# Patient Record
Sex: Female | Born: 1935
Health system: Southern US, Community
[De-identification: ages and names within clinical notes are randomized; demographics above are authoritative.]

## PROBLEM LIST (undated history)

## (undated) DIAGNOSIS — K649 Unspecified hemorrhoids: Secondary | ICD-10-CM

## (undated) DIAGNOSIS — K219 Gastro-esophageal reflux disease without esophagitis: Secondary | ICD-10-CM

## (undated) DIAGNOSIS — F32A Depression, unspecified: Secondary | ICD-10-CM

## (undated) DIAGNOSIS — I10 Essential (primary) hypertension: Secondary | ICD-10-CM

## (undated) DIAGNOSIS — Q828 Other specified congenital malformations of skin: Secondary | ICD-10-CM

## (undated) DIAGNOSIS — N189 Chronic kidney disease, unspecified: Secondary | ICD-10-CM

## (undated) DIAGNOSIS — F419 Anxiety disorder, unspecified: Secondary | ICD-10-CM

## (undated) DIAGNOSIS — D649 Anemia, unspecified: Secondary | ICD-10-CM

## (undated) DIAGNOSIS — Z8619 Personal history of other infectious and parasitic diseases: Secondary | ICD-10-CM

## (undated) DIAGNOSIS — B351 Tinea unguium: Secondary | ICD-10-CM

## (undated) DIAGNOSIS — E119 Type 2 diabetes mellitus without complications: Secondary | ICD-10-CM

## (undated) DIAGNOSIS — K5900 Constipation, unspecified: Secondary | ICD-10-CM

## (undated) DIAGNOSIS — E039 Hypothyroidism, unspecified: Secondary | ICD-10-CM

## (undated) DIAGNOSIS — G2581 Restless legs syndrome: Secondary | ICD-10-CM

## (undated) DIAGNOSIS — E785 Hyperlipidemia, unspecified: Secondary | ICD-10-CM

## (undated) HISTORY — PX: TONSILECTOMY, ADENOIDECTOMY, BILATERAL MYRINGOTOMY AND TUBES: SHX2538

## (undated) HISTORY — PX: KIDNEY STONE SURGERY: SHX686

## (undated) HISTORY — PX: COLONOSCOPY W/ POLYPECTOMY: SHX1380

## (undated) HISTORY — PX: BREAST BIOPSY: SHX20

## (undated) HISTORY — PX: TONSILLECTOMY: SUR1361

## (undated) HISTORY — DX: Tinea unguium: B35.1

## (undated) HISTORY — DX: Essential (primary) hypertension: I10

## (undated) HISTORY — DX: Gastro-esophageal reflux disease without esophagitis: K21.9

## (undated) HISTORY — PX: TRIGGER FINGER RELEASE: SHX641

## (undated) HISTORY — PX: OTHER SURGICAL HISTORY: SHX169

## (undated) HISTORY — PX: EYE SURGERY: SHX253

## (undated) HISTORY — PX: BREAST EXCISIONAL BIOPSY: SUR124

## (undated) HISTORY — DX: Chronic kidney disease, unspecified: N18.9

## (undated) HISTORY — PX: HAND SURGERY: SHX662

## (undated) HISTORY — DX: Other specified congenital malformations of skin: Q82.8

---

## 2003-12-03 ENCOUNTER — Ambulatory Visit: Payer: Self-pay | Admitting: Internal Medicine

## 2003-12-20 ENCOUNTER — Ambulatory Visit: Payer: Self-pay | Admitting: Internal Medicine

## 2004-04-08 ENCOUNTER — Ambulatory Visit: Payer: Self-pay | Admitting: Unknown Physician Specialty

## 2004-07-29 ENCOUNTER — Ambulatory Visit: Payer: Self-pay | Admitting: Internal Medicine

## 2004-08-18 ENCOUNTER — Ambulatory Visit: Payer: Self-pay | Admitting: Unknown Physician Specialty

## 2005-08-04 ENCOUNTER — Ambulatory Visit: Payer: Self-pay | Admitting: Internal Medicine

## 2005-10-21 ENCOUNTER — Ambulatory Visit: Payer: Self-pay | Admitting: Internal Medicine

## 2005-11-19 ENCOUNTER — Ambulatory Visit: Payer: Self-pay | Admitting: Internal Medicine

## 2006-04-12 ENCOUNTER — Ambulatory Visit: Payer: Self-pay | Admitting: Ophthalmology

## 2006-04-14 ENCOUNTER — Ambulatory Visit: Payer: Self-pay | Admitting: Ophthalmology

## 2006-08-18 ENCOUNTER — Ambulatory Visit: Payer: Self-pay | Admitting: Unknown Physician Specialty

## 2006-08-26 ENCOUNTER — Ambulatory Visit: Payer: Self-pay | Admitting: Internal Medicine

## 2006-11-12 ENCOUNTER — Ambulatory Visit: Payer: Self-pay | Admitting: Urology

## 2007-08-30 ENCOUNTER — Ambulatory Visit: Payer: Self-pay | Admitting: Internal Medicine

## 2008-11-22 ENCOUNTER — Ambulatory Visit: Payer: Self-pay | Admitting: Internal Medicine

## 2009-11-21 ENCOUNTER — Ambulatory Visit: Payer: Self-pay | Admitting: Unknown Physician Specialty

## 2009-11-22 LAB — PATHOLOGY REPORT

## 2009-11-27 ENCOUNTER — Ambulatory Visit: Payer: Self-pay | Admitting: Internal Medicine

## 2011-01-05 ENCOUNTER — Ambulatory Visit: Payer: Self-pay | Admitting: Internal Medicine

## 2011-02-13 ENCOUNTER — Ambulatory Visit: Payer: Self-pay | Admitting: Unknown Physician Specialty

## 2012-01-06 ENCOUNTER — Ambulatory Visit: Payer: Self-pay | Admitting: Internal Medicine

## 2012-06-28 ENCOUNTER — Emergency Department: Payer: Self-pay | Admitting: Emergency Medicine

## 2012-06-28 LAB — CBC
HGB: 9.7 g/dL — ABNORMAL LOW (ref 12.0–16.0)
MCH: 31 pg (ref 26.0–34.0)
WBC: 8.2 10*3/uL (ref 3.6–11.0)

## 2012-06-29 LAB — BASIC METABOLIC PANEL
Anion Gap: 6 — ABNORMAL LOW (ref 7–16)
BUN: 27 mg/dL — ABNORMAL HIGH (ref 7–18)
Calcium, Total: 8.9 mg/dL (ref 8.5–10.1)
Chloride: 109 mmol/L — ABNORMAL HIGH (ref 98–107)
EGFR (African American): 34 — ABNORMAL LOW
Glucose: 89 mg/dL (ref 65–99)
Osmolality: 284 (ref 275–301)
Potassium: 4.2 mmol/L (ref 3.5–5.1)

## 2012-06-29 LAB — TROPONIN I: Troponin-I: <0.02 ng/mL

## 2013-01-10 ENCOUNTER — Ambulatory Visit: Payer: Self-pay | Admitting: Family Medicine

## 2013-05-25 DIAGNOSIS — B351 Tinea unguium: Secondary | ICD-10-CM | POA: Insufficient documentation

## 2013-05-25 DIAGNOSIS — L851 Acquired keratosis [keratoderma] palmaris et plantaris: Secondary | ICD-10-CM | POA: Insufficient documentation

## 2013-06-15 DIAGNOSIS — M189 Osteoarthritis of first carpometacarpal joint, unspecified: Secondary | ICD-10-CM | POA: Insufficient documentation

## 2013-09-05 DIAGNOSIS — N183 Chronic kidney disease, stage 3 unspecified: Secondary | ICD-10-CM | POA: Insufficient documentation

## 2013-09-05 DIAGNOSIS — N189 Chronic kidney disease, unspecified: Secondary | ICD-10-CM | POA: Insufficient documentation

## 2013-09-05 DIAGNOSIS — N1831 Chronic kidney disease, stage 3a: Secondary | ICD-10-CM | POA: Insufficient documentation

## 2013-09-05 DIAGNOSIS — I129 Hypertensive chronic kidney disease with stage 1 through stage 4 chronic kidney disease, or unspecified chronic kidney disease: Secondary | ICD-10-CM | POA: Insufficient documentation

## 2014-01-16 ENCOUNTER — Ambulatory Visit: Payer: Self-pay | Admitting: Family Medicine

## 2015-03-12 DIAGNOSIS — Z79899 Other long term (current) drug therapy: Secondary | ICD-10-CM | POA: Diagnosis not present

## 2015-03-12 DIAGNOSIS — N183 Chronic kidney disease, stage 3 (moderate): Secondary | ICD-10-CM | POA: Diagnosis not present

## 2015-03-12 DIAGNOSIS — E039 Hypothyroidism, unspecified: Secondary | ICD-10-CM | POA: Diagnosis not present

## 2015-03-12 DIAGNOSIS — E1122 Type 2 diabetes mellitus with diabetic chronic kidney disease: Secondary | ICD-10-CM | POA: Diagnosis not present

## 2015-03-19 DIAGNOSIS — Z79899 Other long term (current) drug therapy: Secondary | ICD-10-CM | POA: Diagnosis not present

## 2015-03-19 DIAGNOSIS — N183 Chronic kidney disease, stage 3 (moderate): Secondary | ICD-10-CM | POA: Diagnosis not present

## 2015-03-19 DIAGNOSIS — E039 Hypothyroidism, unspecified: Secondary | ICD-10-CM | POA: Diagnosis not present

## 2015-03-19 DIAGNOSIS — I10 Essential (primary) hypertension: Secondary | ICD-10-CM | POA: Diagnosis not present

## 2015-03-19 DIAGNOSIS — E1165 Type 2 diabetes mellitus with hyperglycemia: Secondary | ICD-10-CM | POA: Diagnosis not present

## 2015-03-19 DIAGNOSIS — E7800 Pure hypercholesterolemia, unspecified: Secondary | ICD-10-CM | POA: Diagnosis not present

## 2015-03-19 DIAGNOSIS — E1122 Type 2 diabetes mellitus with diabetic chronic kidney disease: Secondary | ICD-10-CM | POA: Diagnosis not present

## 2015-05-10 DIAGNOSIS — D225 Melanocytic nevi of trunk: Secondary | ICD-10-CM | POA: Diagnosis not present

## 2015-05-10 DIAGNOSIS — D2271 Melanocytic nevi of right lower limb, including hip: Secondary | ICD-10-CM | POA: Diagnosis not present

## 2015-05-10 DIAGNOSIS — L821 Other seborrheic keratosis: Secondary | ICD-10-CM | POA: Diagnosis not present

## 2015-05-10 DIAGNOSIS — D2262 Melanocytic nevi of left upper limb, including shoulder: Secondary | ICD-10-CM | POA: Diagnosis not present

## 2015-06-21 DIAGNOSIS — E119 Type 2 diabetes mellitus without complications: Secondary | ICD-10-CM | POA: Diagnosis not present

## 2015-08-01 DIAGNOSIS — M1811 Unilateral primary osteoarthritis of first carpometacarpal joint, right hand: Secondary | ICD-10-CM | POA: Diagnosis not present

## 2015-09-09 DIAGNOSIS — E1122 Type 2 diabetes mellitus with diabetic chronic kidney disease: Secondary | ICD-10-CM | POA: Diagnosis not present

## 2015-09-09 DIAGNOSIS — E039 Hypothyroidism, unspecified: Secondary | ICD-10-CM | POA: Diagnosis not present

## 2015-09-09 DIAGNOSIS — Z79899 Other long term (current) drug therapy: Secondary | ICD-10-CM | POA: Diagnosis not present

## 2015-09-09 DIAGNOSIS — E7800 Pure hypercholesterolemia, unspecified: Secondary | ICD-10-CM | POA: Diagnosis not present

## 2015-09-09 DIAGNOSIS — N183 Chronic kidney disease, stage 3 (moderate): Secondary | ICD-10-CM | POA: Diagnosis not present

## 2015-09-09 DIAGNOSIS — E1165 Type 2 diabetes mellitus with hyperglycemia: Secondary | ICD-10-CM | POA: Diagnosis not present

## 2015-09-16 DIAGNOSIS — E039 Hypothyroidism, unspecified: Secondary | ICD-10-CM | POA: Diagnosis not present

## 2015-09-16 DIAGNOSIS — Z79899 Other long term (current) drug therapy: Secondary | ICD-10-CM | POA: Diagnosis not present

## 2015-09-16 DIAGNOSIS — Z0000 Encounter for general adult medical examination without abnormal findings: Secondary | ICD-10-CM | POA: Diagnosis not present

## 2015-09-16 DIAGNOSIS — E1122 Type 2 diabetes mellitus with diabetic chronic kidney disease: Secondary | ICD-10-CM | POA: Diagnosis not present

## 2015-09-16 DIAGNOSIS — N183 Chronic kidney disease, stage 3 (moderate): Secondary | ICD-10-CM | POA: Diagnosis not present

## 2015-09-16 DIAGNOSIS — D638 Anemia in other chronic diseases classified elsewhere: Secondary | ICD-10-CM | POA: Diagnosis not present

## 2015-09-16 DIAGNOSIS — E7800 Pure hypercholesterolemia, unspecified: Secondary | ICD-10-CM | POA: Diagnosis not present

## 2015-09-16 DIAGNOSIS — E1165 Type 2 diabetes mellitus with hyperglycemia: Secondary | ICD-10-CM | POA: Diagnosis not present

## 2016-02-25 ENCOUNTER — Encounter: Payer: Self-pay | Admitting: Family Medicine

## 2016-02-25 ENCOUNTER — Ambulatory Visit (INDEPENDENT_AMBULATORY_CARE_PROVIDER_SITE_OTHER): Payer: Self-pay | Admitting: Family Medicine

## 2016-02-25 ENCOUNTER — Telehealth: Payer: Self-pay | Admitting: Family Medicine

## 2016-02-25 VITALS — BP 130/67 | HR 69 | Temp 99.0°F | Resp 16 | Ht 59.0 in | Wt 134.0 lb

## 2016-02-25 DIAGNOSIS — N183 Chronic kidney disease, stage 3 (moderate): Secondary | ICD-10-CM

## 2016-02-25 DIAGNOSIS — K5909 Other constipation: Secondary | ICD-10-CM

## 2016-02-25 DIAGNOSIS — E119 Type 2 diabetes mellitus without complications: Secondary | ICD-10-CM | POA: Diagnosis not present

## 2016-02-25 DIAGNOSIS — M150 Primary generalized (osteo)arthritis: Secondary | ICD-10-CM

## 2016-02-25 DIAGNOSIS — E1122 Type 2 diabetes mellitus with diabetic chronic kidney disease: Secondary | ICD-10-CM

## 2016-02-25 DIAGNOSIS — I10 Essential (primary) hypertension: Secondary | ICD-10-CM

## 2016-02-25 DIAGNOSIS — E782 Mixed hyperlipidemia: Secondary | ICD-10-CM | POA: Diagnosis not present

## 2016-02-25 DIAGNOSIS — M47812 Spondylosis without myelopathy or radiculopathy, cervical region: Secondary | ICD-10-CM | POA: Diagnosis not present

## 2016-02-25 DIAGNOSIS — M8949 Other hypertrophic osteoarthropathy, multiple sites: Secondary | ICD-10-CM

## 2016-02-25 DIAGNOSIS — E038 Other specified hypothyroidism: Secondary | ICD-10-CM | POA: Diagnosis not present

## 2016-02-25 DIAGNOSIS — E063 Autoimmune thyroiditis: Secondary | ICD-10-CM

## 2016-02-25 DIAGNOSIS — E1121 Type 2 diabetes mellitus with diabetic nephropathy: Secondary | ICD-10-CM | POA: Insufficient documentation

## 2016-02-25 DIAGNOSIS — Z7689 Persons encountering health services in other specified circumstances: Secondary | ICD-10-CM | POA: Diagnosis not present

## 2016-02-25 DIAGNOSIS — K219 Gastro-esophageal reflux disease without esophagitis: Secondary | ICD-10-CM | POA: Diagnosis not present

## 2016-02-25 DIAGNOSIS — K449 Diaphragmatic hernia without obstruction or gangrene: Secondary | ICD-10-CM | POA: Diagnosis not present

## 2016-02-25 DIAGNOSIS — K5900 Constipation, unspecified: Secondary | ICD-10-CM | POA: Insufficient documentation

## 2016-02-25 DIAGNOSIS — E039 Hypothyroidism, unspecified: Secondary | ICD-10-CM | POA: Insufficient documentation

## 2016-02-25 DIAGNOSIS — E1169 Type 2 diabetes mellitus with other specified complication: Secondary | ICD-10-CM | POA: Insufficient documentation

## 2016-02-25 DIAGNOSIS — E785 Hyperlipidemia, unspecified: Secondary | ICD-10-CM

## 2016-02-25 DIAGNOSIS — M159 Polyosteoarthritis, unspecified: Secondary | ICD-10-CM | POA: Insufficient documentation

## 2016-02-25 LAB — POCT GLYCOSYLATED HEMOGLOBIN (HGB A1C): HEMOGLOBIN A1C: 7.4

## 2016-02-25 MED ORDER — SIMVASTATIN 40 MG PO TABS: 40.0000 mg | ORAL_TABLET | Freq: Every day | ORAL | 3 refills | Status: DC

## 2016-02-25 MED ORDER — LEVOTHYROXINE SODIUM 25 MCG PO TABS: ORAL_TABLET | ORAL | 3 refills | Status: DC

## 2016-02-25 MED ORDER — GLIPIZIDE 5 MG PO TABS: 5.0000 mg | ORAL_TABLET | Freq: Two times a day (BID) | ORAL | 3 refills | Status: DC

## 2016-02-25 MED ORDER — BACLOFEN 10 MG PO TABS: 5.0000 mg | ORAL_TABLET | Freq: Every evening | ORAL | 2 refills | Status: DC | PRN

## 2016-02-25 MED ORDER — OMEPRAZOLE 20 MG PO CPDR: 20.0000 mg | DELAYED_RELEASE_CAPSULE | Freq: Every day | ORAL | 3 refills | Status: DC

## 2016-02-25 MED ORDER — ONETOUCH BASIC SYSTEM W/DEVICE KIT: PACK | 0 refills | Status: DC

## 2016-02-25 MED ORDER — GLUCOSE BLOOD VI STRP
ORAL_STRIP | 3 refills | Status: DC
Start: 2016-02-25 — End: 2016-04-14

## 2016-02-25 MED ORDER — LISINOPRIL 20 MG PO TABS: 20.0000 mg | ORAL_TABLET | Freq: Every day | ORAL | 3 refills | Status: DC

## 2016-02-25 NOTE — Telephone Encounter (Signed)
Pt said her insurance would cover a One Touch meter.  Please call meter and 90 day strips to [employer].

## 2016-02-25 NOTE — Assessment & Plan Note (Signed)
Stable BP Complication in setting of DM2 with CKD-III-IV Continue current regimen Refill Lisinopril 20mg  daily, #90 day supply

## 2016-02-25 NOTE — Assessment & Plan Note (Signed)
Underlying etiology to chronic intermittent flares with pain, see A&P for C-spine, also with low back and hand upper extremity arthritis.

## 2016-02-25 NOTE — Patient Instructions (Signed)
Thank you for coming in to clinic today.  1. A1c 7.4, good result, no med change today, except did switch from Glipizide EXtended Relase to the 12 hour normal glipizide  - Call insurance and ask what preferred Glucometer is and notify us and we can send it to your pharmacy  Check sugar once a day as needed  2. For arthritis in neck and muscle pains - Recommend to start taking Tylenol Extra Strength 500mg  tabs - take 1 to 2 tabs per dose (max 1000mg ) every 6-8 hours for pain, max 24 hour daily dose is 6 tablets or 3000mg . In the future you can repeat the same everyday Tylenol course for 1-2 weeks at a time. - Stay away from Aspirin, Advil, Aleve  Start taking Baclofen (Lioresal) 10mg  (muscle relaxant) - start with half (cut) to one whole pill at night as needed for next 1-3 nights (may make you drowsy, caution with driving) see how it affects you, then if tolerated increase to one pill 2 to 3 times a day or (every 8 hours as needed)  Heating pad is good continue to use this as needed  Consider topical muscle rub if you want as well  Referral to Nephrology or kidney specialist - make sure they schedule you in [place] ([place]) [address] [place], [place] [address] Phone: [phone]   ---------------- Most likely you have some residual constipation - Start miralax again half cap powder daily for several days to see how you do on this, if regular soft well formed bowel movement 1-2 times a day then that is good - If not improved, then return to office and we can check X-ray - Also can try OTC Dulcolax stool softener pill 100mg  daily  Please schedule a follow-up appointment with Dr. Parks Ranger in 3 months to follow-up Mood / Anxiety PHQ / GAD  If you have any other questions or concerns, please feel free to call the clinic or send a message through [facility]. You may also schedule an earlier appointment if necessary.  Nobie Putnam, DO [place]

## 2016-02-25 NOTE — Assessment & Plan Note (Addendum)
Stable, controlled, A1c down to 7.4 (from 7.8) Complications - nephropathy with CKD III-IV, also in setting HTN  Plan:  1. Switched Glipizide ER 5mg  BID to regular Glipizide 5mg  BID for now, unsure reasoning behind ER BID dosing, however overall caution for hypoglycemia in elderly patient, may consider reducing dose sulfonylurea in future 2. Remain off Metformin due to CKD, however questionable if at current creatinine could resume metformin low dose, would defer to nephrology in future 3. Cannot take ASA due to CKD, continue statin, on ACEi 4. Encouraged lifestyle improvements with DM diet, however limited by husband diet (requires soft diet and she is primary caregiver meal prep), continue walking but may need to increase 5. Follow-up 3 months for DM visit, A1c, foot exam, check NCIR if due for next pneumonia vaccine, recommend ophthalmology (refer if needed)

## 2016-02-25 NOTE — Progress Notes (Signed)
Subjective:    Patient ID: Lisa Mills, female    DOB: 1929-08-21, 83 y.o.   MRN: [id number]  Lisa Mills is a 83 y.o. female presenting on 02/25/2016 for Establish Care (pt is concened about pain on Left side lower abdominal has trouble with indigestion )  Transfering care to [facility] due to lives locally in [place], and her husband is a patient here as well.  Specialists: - [facility] - Dr Baldemar Lenis - Dermatology - Dr Evorn Gong - Cardiology - [facility] (Dr Saralyn Pilar), seen once about 4 years ago for stress testing following episode of chest discomfort with all negative results, not available in Epic chart currently - Orthopedics - [facility] (Dr Rudene Christians, Dorise Hiss PA) - Gastroenterologist - [facility] (Dr Vira Agar)  HPI  Left Abdominal Discomfort / CONSTIPATION: - Reports symptoms started about 10 days ago with left sided flank under left lower rib pain with acute onset with intermittent episodes, mild severity 2/10, lasting several minutes, can repeat several times a day. Not improved or worsened by any activities, not worse with deep breathing, eating. Admits some associated bloated and gas symptoms. - Now no longer constipated over past few weeks, now recently improved, will currently have firmer stool balls x 1 BM not regular will often be erratic. History of using miralax about half cap daily in past for relief, she tried miralax a few weeks ago with relief then stopped it. She did have occasional problem of loose stool with some fecal incontinence rarely on miralax. - Additional related problem thought her constipation and other symptoms recently with dizziness were due to benadryl and prozac, she stopped these about 1 month ago and has had mostly improved symptoms. - History of hiatal hernia with some Left upper quadrant discomfort  Hyporthyroidism, Chronic - No other significant history, no recent changes, last TSH 4.060 (08/2015), has continued Levothyroxine 55mg daily  without change recently, requesting refill today  CHRONIC DM, Type 2: Reports no concerns today. Would like A1c. Last A1c 7.8 in 08/2015,  Previously had better control. - Request new glucometer, has health team adv ins, has older glucometer 81years old CBGs: Avg 100-120s, Low >100, High <150. Checks CBGs x 1 daily Meds: Glipizide ER '5mg'$  BID, history of previously on Metformin but was taken off of this within past few years due to CKD Reports  good compliance. Tolerating well w/o side-effects Currently on ACEi (taking Lisinopril for years without problem) Lifestyle: Diet (mostly home cooked, eats soft foods due to husband having dentures, does not always follow DM diet due) / Exercise (walking dog when weather is good, several times a day) Denies hypoglycemia  CKD Stage III-IV, in setting of Chronic DM2  / HTN - Previously followed Nephrology many years ago for CKD, previously [place] in [place] but would request to [place]rology [place]. - Last labs on chart are 08/2015 with Cr 1.6 and eGFR 31, has had stable baseline 1.5 to 1.6 over past 1-2 years - Does not take NSAIDs or ASA as advised years ago  Chronic Osteoarthritis / DJD multiple joints including primarily C-spine with muscle spasm: - Reports chronic problem without significant known traumatic injury, prior history of C spine MRI 2006 with C5-6 DJD and C6-7 spinal stenosis, also history of other joint arthritis with low back, upper extremities including Right thumb with CMC arthritis, followed by [facility]- Currently stable without worsening symptoms, has intermittent flares of pain mostly neck and with muscle spasms,  in past she was given Tramadol 1 year ago PRN pain due to arthritis, tried this intermittently for up to 6 months but could not tolerate due to nausea, then most recently 08/2015 her PCP gave her rx Hydrocodone PRN, however she very rarely takes this and has bottle today to show limited  pill use - Takes Tylenol Arthritis '650mg'$  x 2 per dose but often does not repeat dose - Tries heating pad - Cannot take NSAIDs due to CKD  H/o Depression / Anxiety - Reports significant stress in her life as primary caregiver for her husband who is ill and her dog who has seizures and requires medicine 3 times daily. She does not endorse long chronic history of depression/anxiety, had trial in past on Prozac and did well, she was restarted on this 08/2015 low dose '10mg'$  daily, tolerated it well for about 4-5 months, until recently self discontinued due to concern with possible side effect of constipation - Denies suicidal or homicidal ideation  Additional PMH HTN, HLD - request refills  Depression screen PHQ 2/9 02/25/2016  Decreased Interest 0  Down, Depressed, Hopeless 0  PHQ - 2 Score 0    Past Medical History:  Diagnosis Date  . Arthritis   . Chronic kidney disease   . Dermatophytosis of nail   . Diabetes type 2, controlled ([place])   . GERD (gastroesophageal reflux disease)   . Hyperlipidemia   . Hypertension   . Keratoderma   . Shingles    Past Surgical History:  Procedure Laterality Date  . cataracts    . HAND SURGERY    . KIDNEY STONE SURGERY    . TONSILECTOMY, ADENOIDECTOMY, BILATERAL MYRINGOTOMY AND TUBES    . TRIGGER FINGER RELEASE     Left ring finger  . tubercular peritonitis     Social History   Social History  . Marital status: Married    Spouse name: N/A  . Number of children: N/A  . Years of education: N/A   Occupational History  . Not on file.   Social History Main Topics  . Smoking status: Never Smoker  . Smokeless tobacco: Never Used  . Alcohol use Yes  . Drug use: No  . Sexual activity: Not on file   Other Topics Concern  . Not on file   Social History Narrative  . No narrative on file   History reviewed. No pertinent family history. No current outpatient prescriptions on file prior to visit.   No current facility-administered medications  on file prior to visit.     Review of Systems  Constitutional: Negative for activity change, appetite change, chills, diaphoresis, fatigue, fever and unexpected weight change.  HENT: Negative for congestion, hearing loss and sinus pressure.   Eyes: Negative for visual disturbance.  Respiratory: Negative for cough, chest tightness, shortness of breath and wheezing.   Cardiovascular: Negative for chest pain, palpitations and leg swelling.  Gastrointestinal: Positive for abdominal pain. Negative for abdominal distention, anal bleeding, blood in stool, constipation, diarrhea, nausea and vomiting.  Endocrine: Negative for cold intolerance and polyuria.  Genitourinary: Negative for decreased urine volume, difficulty urinating, dysuria, frequency and hematuria.  Musculoskeletal: Positive for arthralgias (neck and back occasionally). Negative for back pain, joint swelling, myalgias and neck pain.  Skin: Negative for rash.  Allergic/Immunologic: Negative for environmental allergies.  Neurological: Negative for dizziness, weakness, light-headedness, numbness and headaches.  Hematological: Negative for adenopathy.  Psychiatric/Behavioral: Negative for agitation, behavioral problems, decreased concentration, dysphoric mood, self-injury, sleep disturbance and suicidal ideas.  The patient is nervous/anxious (with recent stress providing care for husband).    Per HPI unless specifically indicated above     Objective:    BP 130/67   Pulse 69   Temp 99 F (37.2 C) (Oral)   Resp 16   Ht '4\' 11"'$  (1.499 m)   Wt 134 lb (60.8 kg)   BMI 27.06 kg/m   Wt Readings from Last 3 Encounters:  02/25/16 134 lb (60.8 kg)    Physical Exam  Constitutional: She is oriented to person, place, and time. She appears well-developed and well-nourished. No distress.  Well-appearing elderly 83 yr female, comfortable, cooperative  HENT:  Head: Normocephalic and atraumatic.  Mouth/Throat: Oropharynx is clear and moist.    Nares patent without congestion or edema. Bilateral TMs clear without erythema, effusion or bulging. Oropharynx clear without erythema, exudates, edema or asymmetry.  Eyes: Conjunctivae and EOM are normal. Pupils are equal, round, and reactive to light.  Neck: Normal range of motion. Neck supple. No thyromegaly present.  Cardiovascular: Normal rate, regular rhythm, normal heart sounds and intact distal pulses.   No murmur heard. Pulmonary/Chest: Effort normal and breath sounds normal. No respiratory distress. She has no wheezes. She has no rales.  Abdominal: Soft. Bowel sounds are normal. She exhibits no distension and no mass. There is no tenderness. There is no rebound and no guarding.  Negative McBurney's RLQ. Negative Murphy's RUQ. Left lower abdomen is non tender, but some discomfort not tender to mid left abdomen  Musculoskeletal: Normal range of motion. She exhibits no edema or tenderness.  Back normal without deformity or abnormal curvature.  Upper / Lower Extremities: - Normal muscle tone, strength bilateral upper extremities 5/5, lower extremities 5/5  Lymphadenopathy:    She has no cervical adenopathy.  Neurological: She is alert and oriented to person, place, and time.  Distal sensation to light touch intact  Skin: Skin is warm and dry. No rash noted. She is not diaphoretic.  Psychiatric: She has a normal mood and affect. Her behavior is normal.  Well groomed, good eye contact, normal speech and thoughts  Nursing note and vitals reviewed.  Results for orders placed or performed in visit on 02/25/16  POCT glycosylated hemoglobin (Hb A1C)  Result Value Ref Range   Hemoglobin A1C 7.4       Assessment & Plan:   Problem List Items Addressed This Visit    Osteoarthritis of multiple joints    Underlying etiology to chronic intermittent flares with pain, see A&P for C-spine, also with low back and hand upper extremity arthritis.       Relevant Medications   baclofen  (LIORESAL) 10 MG tablet   Hypothyroidism    Stable on current Levothyroxine 85mg daily Last TSH 4.060 (08/2015) Refill Levothyroxine 90 day supply Check future TSH in 08/2016 for annual physical, order not placed yet      Relevant Medications   levothyroxine (SYNTHROID, LEVOTHROID) 25 MCG tablet   Hypertension    Stable BP Complication in setting of DM2 with CKD-III-IV Continue current regimen Refill Lisinopril '20mg'$  daily, #90 day supply      Relevant Medications   lisinopril (PRINIVIL,ZESTRIL) 20 MG tablet   simvastatin (ZOCOR) 40 MG tablet   Hyperlipidemia    Last lipids abnormal 08/2015 with increased LDL despite statin therapy Continue Simvastatin '40mg'$  daily without change, refilled today Encouraged healthy lifestyle exercise Follow-up fasting lipids 08/2016 for annual physical      Relevant Medications   lisinopril (PRINIVIL,ZESTRIL) 20 MG tablet  simvastatin (ZOCOR) 40 MG tablet   Hiatal hernia   GERD (gastroesophageal reflux disease)    Controlled on PPI omeprazole '20mg'$ , refill sent      Relevant Medications   omeprazole (PRILOSEC) 20 MG capsule   DJD (degenerative joint disease) of cervical spine    Stable DJD C-spine and multiple joints with chronic recurrent flares, currently well without flare Followed by [facility], last imaging MRI 2006 with C5-6 and C6-7 DJD spinal stenosis, may have updated imaging on outside records - Inadequate conservative treatments at home with limited Tylenol use, contraindicated NSAIDs, intolerant of Tramadol  Plan: 1. Increase Tylenol dosing up to '1000mg'$  TID PRN as discussed 2. Start trial new muscle relaxant Baclofen 5-'10mg'$  at night PRN for pain / muscle spasms and can help sleep, caution sedation, discussion on this being safer alternative to hydrocodone and opiates not preferred option for chronic pain for osteoarthritis of elderly 86 yr patient, she has existing rx and can take only if needed, advised that I do not plan to  continue this currently, until further evaluation in future if all other meds contraindicated due to CKD this may be possibility 3. Continue follow-up with [facility] as needed 4. Future consider PT if needed      Relevant Medications   baclofen (LIORESAL) 10 MG tablet   Diabetes type 2, controlled (HCC)    Stable, controlled, A1c down to 7.4 (from 7.8) Complications - nephropathy with CKD III-IV, also in setting HTN  Plan:  1. Switched Glipizide ER '5mg'$  BID to regular Glipizide '5mg'$  BID for now, unsure reasoning behind ER BID dosing, however overall caution for hypoglycemia in elderly patient, may consider reducing dose sulfonylurea in future 2. Remain off Metformin due to CKD, however questionable if at current creatinine could resume metformin low dose, would defer to nephrology in future 3. Cannot take ASA due to CKD, continue statin, on ACEi 4. Encouraged lifestyle improvements with DM diet, however limited by husband diet (requires soft diet and she is primary caregiver meal prep), continue walking but may need to increase 5. Follow-up 3 months for DM visit, A1c, foot exam, check NCIR if due for next pneumonia vaccine, recommend ophthalmology (refer if needed)      Relevant Medications   lisinopril (PRINIVIL,ZESTRIL) 20 MG tablet   glipiZIDE (GLUCOTROL) 5 MG tablet   simvastatin (ZOCOR) 40 MG tablet   Other Relevant Orders   POCT glycosylated hemoglobin (Hb A1C) (Completed)   Constipation    Acute on chronic problem, improving. Likely etiology for Left sided abdominal discomfort. Chronic history of intermittent constipation, unclear exact triggers, more recently seems to be worsened by benadryl use for insomnia, anticholinergic side effects with constipation, also possibly worse on Prozac, but unsure if this was cause of her symptoms  Plan: 1. Resume Miralax half to quarter cap daily for now titrate up / down as indicated for soft 1-2 well forms BMs daily, may need to  continue for few days to weeks, and resume in future as maintenance 2. Offered KUB today to check stool burden, declined will consider in future 3. Improve hydration, fiber in diet 4. May try OTC stool softener dulcolax for maintenance as well if miralax unable to tolerate due to some fecal incontinence 5. Follow-up as needed if not improved      CKD stage 3 due to type 2 diabetes mellitus (HCC)    Stable, chronic problem over past few years, baseline Cr 1.6, last checked 08/2015, secondary to likely DM2 and HTN,  also age at 34. - Previously followed by Nephrology years ago in [place] by report, no records available  Plan: 1. Reviewed DM and HTN control 2. Counseling on avoiding NSAIDs, and continue prior recommendations of avoid ASA as well currently, but would like further input from Nephrology on if ASA 81 mg could be viable option for this patient 3. Referral to [place] office (patient request) to establish care given significant CKD and may benefit from future nephrology management if CKD continues to decline      Relevant Medications   lisinopril (PRINIVIL,ZESTRIL) 20 MG tablet   glipiZIDE (GLUCOTROL) 5 MG tablet   simvastatin (ZOCOR) 40 MG tablet   Other Relevant Orders   Ambulatory referral to Internal Medicine    Other Visit Diagnoses    Encounter to establish care with new doctor    -  Primary      Meds ordered this encounter  Medications  . DISCONTD: glipiZIDE (GLUCOTROL XL) 5 MG 24 hr tablet    Sig: Take by mouth.  . DISCONTD: lisinopril (PRINIVIL,ZESTRIL) 20 MG tablet    Sig: Take by mouth.  . DISCONTD: levothyroxine (SYNTHROID, LEVOTHROID) 25 MCG tablet    Sig: TAKE 1 TABLET(25 MCG) BY MOUTH EVERY DAY 30 TO 60 MINUTES BEFORE BREAKFAST ON AN EMPTY STOMACH AND WITH A GLASS OF WATER  . DISCONTD: simvastatin (ZOCOR) 40 MG tablet    Sig: Take by mouth.  . vitamin E 400 UNIT capsule    Sig: Take by mouth.  . Biotin 1000 MCG tablet     Sig: Take by mouth.  . DISCONTD: FLUoxetine (PROZAC) 10 MG tablet    Sig: TAKE 1 TABLET(10 MG) BY MOUTH EVERY DAY  . DISCONTD: diphenhydrAMINE (BENADRYL) 25 mg capsule    Sig: Take by mouth.  . DISCONTD: OMEPRAZOLE PO    Sig: Take 20 mg by mouth.  Marland Kitchen lisinopril (PRINIVIL,ZESTRIL) 20 MG tablet    Sig: Take 1 tablet (20 mg total) by mouth daily.    Dispense:  90 tablet    Refill:  3  . glipiZIDE (GLUCOTROL) 5 MG tablet    Sig: Take 1 tablet (5 mg total) by mouth 2 (two) times daily before a meal.    Dispense:  180 tablet    Refill:  3  . levothyroxine (SYNTHROID, LEVOTHROID) 25 MCG tablet    Sig: TAKE 1 TABLET(25 MCG) BY MOUTH EVERY DAY 30 TO 60 MINUTES BEFORE BREAKFAST ON AN EMPTY STOMACH AND WITH A GLASS OF WATER    Dispense:  90 tablet    Refill:  3  . simvastatin (ZOCOR) 40 MG tablet    Sig: Take 1 tablet (40 mg total) by mouth at bedtime.    Dispense:  90 tablet    Refill:  3  . omeprazole (PRILOSEC) 20 MG capsule    Sig: Take 1 capsule (20 mg total) by mouth daily.    Dispense:  90 capsule    Refill:  3  . baclofen (LIORESAL) 10 MG tablet    Sig: Take 0.5-1 tablets (5-10 mg total) by mouth at bedtime as needed for muscle spasms.    Dispense:  30 each    Refill:  2      Follow up plan: Return in about 3 months (around 05/24/2016) for diabetes, A1c, mood / anxiety PHQ GAD7.  Nobie Putnam, [facility] 02/25/2016, 7:15 PM

## 2016-02-25 NOTE — Assessment & Plan Note (Signed)
Stable DJD C-spine and multiple joints with chronic recurrent flares, currently well without flare Followed by [facility], last imaging MRI 2006 with C5-6 and C6-7 DJD spinal stenosis, may have updated imaging on outside records - Inadequate conservative treatments at home with limited Tylenol use, contraindicated NSAIDs, intolerant of Tramadol  Plan: 1. Increase Tylenol dosing up to 1000mg  TID PRN as discussed 2. Start trial new muscle relaxant Baclofen 5-10mg  at night PRN for pain / muscle spasms and can help sleep, caution sedation, discussion on this being safer alternative to hydrocodone and opiates not preferred option for chronic pain for osteoarthritis of elderly 86 yr patient, she has existing rx and can take only if needed, advised that I do not plan to continue this currently, until further evaluation in future if all other meds contraindicated due to CKD this may be possibility 3. Continue follow-up with [facility] as needed 4. Future consider PT if needed

## 2016-02-25 NOTE — Telephone Encounter (Signed)
Sent rx to [employer] for [employer] and [employer] strips, #100 for 90 day supply check CBG once daily.  Nobie Putnam, [facility] 02/25/2016, 3:01 PM

## 2016-02-25 NOTE — Assessment & Plan Note (Signed)
Controlled on PPI omeprazole 20mg , refill sent

## 2016-02-25 NOTE — Assessment & Plan Note (Signed)
Last lipids abnormal 08/2015 with increased LDL despite statin therapy Continue Simvastatin 40mg  daily without change, refilled today Encouraged healthy lifestyle exercise Follow-up fasting lipids 08/2016 for annual physical

## 2016-02-25 NOTE — Assessment & Plan Note (Signed)
Stable on current Levothyroxine 68mcg daily Last TSH 4.060 (08/2015) Refill Levothyroxine 90 day supply Check future TSH in 08/2016 for annual physical, order not placed yet

## 2016-02-25 NOTE — Assessment & Plan Note (Signed)
Acute on chronic problem, improving. Likely etiology for Left sided abdominal discomfort. Chronic history of intermittent constipation, unclear exact triggers, more recently seems to be worsened by benadryl use for insomnia, anticholinergic side effects with constipation, also possibly worse on Prozac, but unsure if this was cause of her symptoms  Plan: 1. Resume Miralax half to quarter cap daily for now titrate up / down as indicated for soft 1-2 well forms BMs daily, may need to continue for few days to weeks, and resume in future as maintenance 2. Offered KUB today to check stool burden, declined will consider in future 3. Improve hydration, fiber in diet 4. May try OTC stool softener dulcolax for maintenance as well if miralax unable to tolerate due to some fecal incontinence 5. Follow-up as needed if not improved

## 2016-02-25 NOTE — Assessment & Plan Note (Addendum)
Stable, chronic problem over past few years, baseline Cr 1.6, last checked 08/2015, secondary to likely DM2 and HTN, also age at 75. - Previously followed by Nephrology years ago in [place] by report, no records available  Plan: 1. Reviewed DM and HTN control 2. Counseling on avoiding NSAIDs, and continue prior recommendations of avoid ASA as well currently, but would like further input from Nephrology on if ASA 81 mg could be viable option for this patient 3. Referral to [place] office (patient request) to establish care given significant CKD and may benefit from future nephrology management if CKD continues to decline

## 2016-02-26 ENCOUNTER — Telehealth: Payer: Self-pay | Admitting: Family Medicine

## 2016-02-26 ENCOUNTER — Encounter: Payer: Self-pay | Admitting: Family Medicine

## 2016-02-26 DIAGNOSIS — N183 Chronic kidney disease, stage 3 unspecified: Secondary | ICD-10-CM

## 2016-02-26 DIAGNOSIS — I10 Essential (primary) hypertension: Secondary | ICD-10-CM

## 2016-02-26 DIAGNOSIS — N189 Chronic kidney disease, unspecified: Secondary | ICD-10-CM

## 2016-02-26 DIAGNOSIS — E1122 Type 2 diabetes mellitus with diabetic chronic kidney disease: Secondary | ICD-10-CM

## 2016-02-26 DIAGNOSIS — D631 Anemia in chronic kidney disease: Secondary | ICD-10-CM | POA: Insufficient documentation

## 2016-02-26 NOTE — Telephone Encounter (Signed)
Patient established care with me 02/25/16, to be referred to [facility] for Nephrology given CKD, last labs 08/2015 with Cr 1.6 has been stable for 1-2 years, prior to referral will check repeat chemistry lab by request of Nephrology. Patient to be notified to arrive for non fasting lab only and then referral to be processed.  Nobie Putnam, [facility] 02/26/2016, 8:28 AM

## 2016-02-28 ENCOUNTER — Other Ambulatory Visit: Payer: PPO

## 2016-02-28 DIAGNOSIS — N183 Chronic kidney disease, stage 3 (moderate): Secondary | ICD-10-CM | POA: Diagnosis not present

## 2016-02-28 DIAGNOSIS — E1122 Type 2 diabetes mellitus with diabetic chronic kidney disease: Secondary | ICD-10-CM | POA: Diagnosis not present

## 2016-02-28 DIAGNOSIS — D631 Anemia in chronic kidney disease: Secondary | ICD-10-CM | POA: Diagnosis not present

## 2016-02-28 DIAGNOSIS — I10 Essential (primary) hypertension: Secondary | ICD-10-CM | POA: Diagnosis not present

## 2016-02-29 LAB — COMPLETE METABOLIC PANEL WITH GFR
ALT: 7 U/L (ref 6–29)
AST: 15 U/L (ref 10–35)
Albumin: 4.3 g/dL (ref 3.6–5.1)
Alkaline Phosphatase: 34 U/L (ref 33–130)
BUN: 27 mg/dL — AB (ref 7–25)
CHLORIDE: 104 mmol/L (ref 98–110)
CO2: 27 mmol/L (ref 20–31)
CREATININE: 1.57 mg/dL — AB (ref 0.60–0.88)
Calcium: 9 mg/dL (ref 8.6–10.4)
GFR, Est African American: 35 mL/min — ABNORMAL LOW (ref >=60)
GFR, Est Non African American: 30 mL/min — ABNORMAL LOW (ref >=60)
GLUCOSE: 184 mg/dL — AB (ref 65–99)
Potassium: 4.6 mmol/L (ref 3.5–5.3)
SODIUM: 138 mmol/L (ref 135–146)
Total Bilirubin: 0.4 mg/dL (ref 0.2–1.2)
Total Protein: 6.4 g/dL (ref 6.1–8.1)

## 2016-02-29 LAB — CBC WITH DIFFERENTIAL/PLATELET
BASOS ABS: 65 {cells}/uL (ref 0–200)
BASOS PCT: 1 %
EOS ABS: 130 {cells}/uL (ref 15–500)
Eosinophils Relative: 2 %
HEMATOCRIT: 32.2 % — AB (ref 35.0–45.0)
Hemoglobin: 10.5 g/dL — ABNORMAL LOW (ref 11.7–15.5)
LYMPHS PCT: 34 %
Lymphs Abs: 2210 cells/uL (ref 850–3900)
MCH: 30.7 pg (ref 27.0–33.0)
MCHC: 32.6 g/dL (ref 32.0–36.0)
MCV: 94.2 fL (ref 80.0–100.0)
MONO ABS: 520 {cells}/uL (ref 200–950)
MPV: 9 fL (ref 7.5–12.5)
Monocytes Relative: 8 %
NEUTROS PCT: 55 %
Neutro Abs: 3575 cells/uL (ref 1500–7800)
Platelets: 286 10*3/uL (ref 140–400)
RBC: 3.42 MIL/uL — ABNORMAL LOW (ref 3.80–5.10)
RDW: 13.9 % (ref 11.0–15.0)
WBC: 6.5 10*3/uL (ref 3.8–10.8)

## 2016-02-29 LAB — IRON AND TIBC
%SAT: 29 % (ref 11–50)
Iron: 94 ug/dL (ref 45–160)
TIBC: 323 ug/dL (ref 250–450)
UIBC: 229 ug/dL (ref 125–400)

## 2016-03-16 ENCOUNTER — Ambulatory Visit
Admission: RE | Admit: 2016-03-16 | Discharge: 2016-03-16 | Disposition: A | Discharge status: Home / Self Care | Payer: PPO | Source: Ambulatory Visit | Attending: Family Medicine | Admitting: Family Medicine

## 2016-03-16 ENCOUNTER — Encounter: Payer: Self-pay | Admitting: Family Medicine

## 2016-03-16 ENCOUNTER — Ambulatory Visit (INDEPENDENT_AMBULATORY_CARE_PROVIDER_SITE_OTHER): Payer: PPO | Admitting: Family Medicine

## 2016-03-16 VITALS — BP 118/63 | HR 68 | Temp 98.3°F | Resp 16 | Ht 59.0 in | Wt 131.0 lb

## 2016-03-16 DIAGNOSIS — R143 Flatulence: Secondary | ICD-10-CM | POA: Diagnosis not present

## 2016-03-16 DIAGNOSIS — K5909 Other constipation: Secondary | ICD-10-CM

## 2016-03-16 DIAGNOSIS — R1032 Left lower quadrant pain: Secondary | ICD-10-CM | POA: Diagnosis not present

## 2016-03-16 MED ORDER — DICYCLOMINE HCL 10 MG PO CAPS: 10.0000 mg | ORAL_CAPSULE | Freq: Three times a day (TID) | ORAL | 1 refills | Status: DC

## 2016-03-16 NOTE — Assessment & Plan Note (Signed)
Chronic problem, recent worsening, seems to have improved regular soft BMs on miralax after recently resumed 3 weeks ago, but now seems worsening gas pains.  Plan: 1. Check KUB today, showed some stool without significant amount, no obstruction, no fecal impaction. - patient notified of results 2. May hold miralax for now, can use only if needed 3. Continue improved hydration, fiber diet 4. Treat symptomatically with Bentyl PRN for spasms, cramps 5. Advised to schedule with prior [facility] Dr Vira Agar

## 2016-03-16 NOTE — Progress Notes (Signed)
Subjective:    Patient ID: Lisa Mills, female    DOB: Jun 21, 1929, 83 y.o.   MRN: [id number]  Lisa Mills is a 83 y.o. female presenting on 03/16/2016 for Abdominal Pain (as per pt could be diverticulitis has been taken Miralex having costipation painful on lower abdominal more Left side and rediate to back side)  Patient presents for a same day appointment.  HPI   Left Abdominal Pain / CONSTIPATION: - Last seen 02/25/16 to establish care, see note for background info and details, also discussed same complain at that time, recommended patient to resume Miralax 1 cap or less daily, improve hydration, since her BMs were described as more constipated with some harder dry stool balls. - Now presents 3 weeks later with some worsening Left lower abdominal intermittent pain, also some other associated abdominal pains. Describes symptoms significantly worse over past 10 days, describes initially with acute onset epigastric pain with severe pain, took additional zantac along with her omeprazole PPI , then 2 days later the abdominal pain seemed to move to Right abdomen and her R back, she was initially concerned about her gallbladder. She stopped taking glipizide for about 3-4 days and thought maybe this was related, she did seem to get better for few days - Now within past several days had an acute episode of left lower abdominal pain described as severe and sharp and intermittent lasting only minutes then self limited with several times a day, had BM with softer well formed bowel stool, then she stopped the Miralax (about 5 days ago, but did not seem to resolve symptoms off Miralax), then pain moved up to Left flank and axillary area, then improved. Worsening symptoms and upset stomach within 1 hour after eating - Has had similar recurrence of same pain over past several years - Admits sensation of needing to have BM frequently but no regular BMs when has that feeling, Last BM this AM but no diarrhea,  usually passes a lot of gas - Admits some L hip pain past 2 days some worse with ambulation - Worsening symptoms and upset stomach within 1 hour after eating - Took Tylenol x 2 earlier today with some mild relief - Reduced PO intake to eat smaller portions, thoroughly chewed food, avoiding dairy. Tried bland diet. - PMH includes nephrolithiasis, but currently no urinary symptoms - Previously established with GI [facility], Dr Vira Agar - last one after age 14, perhaps about 9 years ago, has had prior questionable polyps, unsure about diagnosis of diverticulosis) - Admits weight down 2-3 lbs only, intentional due to reduced appetite - Denies any fevers/chills, nausea, vomiting, decreased appetite, dark stools blood in stool, dysuria, hematuria, urinary frequency  PMH tubercular peritonitis  Social History  Substance Use Topics  . Smoking status: Never Smoker  . Smokeless tobacco: Never Used  . Alcohol use Yes    Review of Systems Per HPI unless specifically indicated above     Objective:    BP 118/63   Pulse 68   Temp 98.3 F (36.8 C) (Oral)   Resp 16   Ht 4' 11" (1.499 m)   Wt 131 lb (59.4 kg)   BMI 26.46 kg/m   Wt Readings from Last 3 Encounters:  03/16/16 131 lb (59.4 kg)  02/25/16 134 lb (60.8 kg)    Physical Exam  Constitutional: She is oriented to person, place, and time. She appears well-developed and well-nourished. No distress.  Well-appearing 83 yr female, currently comfortable, cooperative  HENT:  Head: Normocephalic and atraumatic.  Mouth/Throat: Oropharynx is clear and moist.  Moist mucus mem. Dentures in place.  Eyes: Conjunctivae are normal.  Cardiovascular: Normal rate, regular rhythm, normal heart sounds and intact distal pulses.   No murmur heard. Pulmonary/Chest: Effort normal and breath sounds normal. No respiratory distress. She has no wheezes. She has no rales.  Abdominal: Soft. She exhibits no distension and no mass. There is tenderness (Left  lower abdomen mildly tender to deeper palpation.). There is no rebound and no guarding.  Hyperactive bowel sounds.  Negative McBurney's RLQ. Negative Murphy's RUQ. Non tender epigastric.  Musculoskeletal:  Back non tender. No CVAT.  Left Hip Inspection: Normal appearance Palpation: Non tender over greater trochanter hip compression and low back SI area ROM: Full active ROM forward flex / back extension, hip flex/ext Special Testing: FABER / FADIR normal without significant pain, hip compression normal Strength: Bilateral hip flex/ext 5/5, knee flex/ext 5/5 Neurovascular: intact distal sensation to light touch  Neurological: She is alert and oriented to person, place, and time.  Gait normal  Skin: Skin is warm and dry. No rash noted. She is not diaphoretic. No erythema.  Psychiatric: She has a normal mood and affect. Her behavior is normal.  Nursing note and vitals reviewed.  I have personally reviewed the following lab results from 02/26/16.  Results for orders placed or performed in visit on 02/26/16  COMPLETE METABOLIC PANEL WITH GFR  Result Value Ref Range   Sodium 138 135 - 146 mmol/L   Potassium 4.6 3.5 - 5.3 mmol/L   Chloride 104 98 - 110 mmol/L   CO2 27 20 - 31 mmol/L   Glucose, Bld 184 (H) 65 - 99 mg/dL   BUN 27 (H) 7 - 25 mg/dL   Creat 1.57 (H) 0.60 - 0.88 mg/dL   Total Bilirubin 0.4 0.2 - 1.2 mg/dL   Alkaline Phosphatase 34 33 - 130 U/L   AST 15 10 - 35 U/L   ALT 7 6 - 29 U/L   Total Protein 6.4 6.1 - 8.1 g/dL   Albumin 4.3 3.6 - 5.1 g/dL   Calcium 9.0 8.6 - 10.4 mg/dL   GFR, Est African American 35 (L) >=60 mL/min   GFR, Est Non African American 30 (L) >=60 mL/min  CBC with Differential/Platelet  Result Value Ref Range   WBC 6.5 3.8 - 10.8 K/uL   RBC 3.42 (L) 3.80 - 5.10 MIL/uL   Hemoglobin 10.5 (L) 11.7 - 15.5 g/dL   HCT 32.2 (L) 35.0 - 45.0 %   MCV 94.2 80.0 - 100.0 fL   MCH 30.7 27.0 - 33.0 pg   MCHC 32.6 32.0 - 36.0 g/dL   RDW 13.9 11.0 - 15.0 %    Platelets 286 140 - 400 K/uL   MPV 9.0 7.5 - 12.5 fL   Neutro Abs 3,575 1,500 - 7,800 cells/uL   Lymphs Abs 2,210 850 - 3,900 cells/uL   Monocytes Absolute 520 200 - 950 cells/uL   Eosinophils Absolute 130 15 - 500 cells/uL   Basophils Absolute 65 0 - 200 cells/uL   Neutrophils Relative % 55 %   Lymphocytes Relative 34 %   Monocytes Relative 8 %   Eosinophils Relative 2 %   Basophils Relative 1 %   Smear Review Criteria for review not met   Iron and TIBC  Result Value Ref Range   Iron 94 45 - 160 ug/dL   UIBC 229 125 - 400 ug/dL   TIBC 323 250 -  450 ug/dL   %SAT 29 11 - 50 %      Assessment & Plan:   Problem List Items Addressed This Visit    Intermittent left lower quadrant abdominal pain - Primary    Recent problem over past >1 month, seemed to be related to constipation initially, otherwise now unclear etiology, thought maybe inc gas and hypermobile due to miralax with cramping pain. - Today abdomen is benign with only mild LLQ discomfort, well appearing, hemodynamically stable - Diff dx is broad, not consistent with biliary colic or worse with eating, no GIB or unlikely diverticulitis (no known history of diverticulosis), less likely GERD / PUD, consider mesenteric ischemia or vascular etiology, again not worse with eating, not characteristic pain and no blood in stool - Unsure if remote history of tubular peritonitis could cause some chronic residual symptoms unsure why this would present now  Plan: 1. Check KUB today, showed some stool without significant amount, no obstruction, no fecal impaction. - patient notified of results 2. May hold miralax for now, can use only if needed 3. Continue improved hydration, fiber diet 4. Treat symptomatically with Bentyl TID WC and QHS PRN for abdominal cramping 5. Advised to schedule with prior [facility] Dr Vira Agar for further work-up 6. Defer repeat labs, recently had blood work 7. Strict return criteria given if acute worsening  symptoms 8. Follow-up 2 weeks - if not improved, otherwise follow-up 2-3 months as planned      Relevant Medications   dicyclomine (BENTYL) 10 MG capsule   Other Relevant Orders   DG Abd 1 View (Completed)   Constipation    Chronic problem, recent worsening, seems to have improved regular soft BMs on miralax after recently resumed 3 weeks ago, but now seems worsening gas pains.  Plan: 1. Check KUB today, showed some stool without significant amount, no obstruction, no fecal impaction. - patient notified of results 2. May hold miralax for now, can use only if needed 3. Continue improved hydration, fiber diet 4. Treat symptomatically with Bentyl PRN for spasms, cramps 5. Advised to schedule with prior [facility] Dr Vira Agar      Relevant Orders   DG Abd 1 View (Completed)      Meds ordered this encounter  Medications  . dicyclomine (BENTYL) 10 MG capsule    Sig: Take 1 capsule (10 mg total) by mouth 4 (four) times daily -  before meals and at bedtime.    Dispense:  30 capsule    Refill:  1      Follow up plan: Return in about 2 weeks (around 03/30/2016), or if symptoms worsen or fail to improve, for abdominal pain.  Lisa Mills, [facility] 03/16/2016, 6:25 PM

## 2016-03-16 NOTE — Patient Instructions (Signed)
Thank you for coming in to clinic today.  1. I don't think this is a serious abdominal infection or diverticulitis problem - Most likely related to bowel movements, constipation, or even taking the MIralax (may have triggered symptoms) - X-ray today, will let you know by phone with results - Start Bentyl (Dicyclomine) 1 pill up to 4 times a day (with food and / or bedtime) for CRAMPING  Please call Dr Elliott's office [facility] to schedule a new visit for complaint of Left Lower Abdominal Pain / Cramping - Let us know if you need a referral ordered to see Dr Vira Agar  If symptoms get worse with abdominal pain not improving (or persistent worsening), difficulty eating drinking, nausea, vomiting, dark stool or blood in stool, then seek more immediate treatment at [facility] ED.  Please schedule a follow-up appointment with Dr. Parks Ranger in 2-3 weeks as need if not improved abdominal pain.  If you have any other questions or concerns, please feel free to call the clinic or send a message through [facility]. You may also schedule an earlier appointment if necessary.  Nobie Putnam, DO [place]

## 2016-03-16 NOTE — Assessment & Plan Note (Signed)
Recent problem over past >1 month, seemed to be related to constipation initially, otherwise now unclear etiology, thought maybe inc gas and hypermobile due to miralax with cramping pain. - Today abdomen is benign with only mild LLQ discomfort, well appearing, hemodynamically stable - Diff dx is broad, not consistent with biliary colic or worse with eating, no GIB or unlikely diverticulitis (no known history of diverticulosis), less likely GERD / PUD, consider mesenteric ischemia or vascular etiology, again not worse with eating, not characteristic pain and no blood in stool - Unsure if remote history of tubular peritonitis could cause some chronic residual symptoms unsure why this would present now  Plan: 1. Check KUB today, showed some stool without significant amount, no obstruction, no fecal impaction. - patient notified of results 2. May hold miralax for now, can use only if needed 3. Continue improved hydration, fiber diet 4. Treat symptomatically with Bentyl TID WC and QHS PRN for abdominal cramping 5. Advised to schedule with prior [facility] Dr Vira Agar for further work-up 6. Defer repeat labs, recently had blood work 7. Strict return criteria given if acute worsening symptoms 8. Follow-up 2 weeks - if not improved, otherwise follow-up 2-3 months as planned

## 2016-03-30 ENCOUNTER — Encounter: Payer: Self-pay | Admitting: Family Medicine

## 2016-03-30 ENCOUNTER — Ambulatory Visit (INDEPENDENT_AMBULATORY_CARE_PROVIDER_SITE_OTHER): Payer: PPO | Admitting: Family Medicine

## 2016-03-30 VITALS — BP 115/59 | HR 70 | Temp 98.6°F | Resp 16 | Ht 59.0 in | Wt 129.0 lb

## 2016-03-30 DIAGNOSIS — N183 Chronic kidney disease, stage 3 unspecified: Secondary | ICD-10-CM

## 2016-03-30 DIAGNOSIS — R1032 Left lower quadrant pain: Secondary | ICD-10-CM | POA: Diagnosis not present

## 2016-03-30 DIAGNOSIS — E1122 Type 2 diabetes mellitus with diabetic chronic kidney disease: Secondary | ICD-10-CM | POA: Diagnosis not present

## 2016-03-30 MED ORDER — SITAGLIPTIN PHOSPHATE 25 MG PO TABS: 25.0000 mg | ORAL_TABLET | Freq: Every day | ORAL | 2 refills | Status: DC

## 2016-03-30 NOTE — Assessment & Plan Note (Signed)
Awaiting new patient appointment in 04/2016 with [facility] - Today limited DM med options due to CKD, see A&P, can't use SGLT2 Metformin - Trial on low dose DPP4 inhib with Sitagliptin, 25mg  daily max dose due to GFR

## 2016-03-30 NOTE — Progress Notes (Signed)
Subjective:    Patient ID: Lisa Mills, female    DOB: 10/06/1929, 83 y.o.   MRN: [id number]  Lisa Mills is a 83 y.o. female presenting on 03/30/2016 for Abdominal Pain (intermitten pain as per pt has Hx of hital hernia-- gerd pain is more on Left side underneath of rib cage obtw pt had pain Left arm only on Saturday)  HPI   FOLLOW-UP / Left Abdominal Pain / CONSTIPATION: - Last visit 03/16/16, for same complaint with Left abdominal pain, KUB x-ray showed some stool but no significant abnormality. - Today she reports overall doing mildly better with regards to Left upper abdominal pain. Does not have active pain at this time. Her appointment with [facility] is in 1 month, on 04/30/16 as scheduled. - From last visit with me she tried Dicyclomine for about 4.5 days with improvement in cramping, but developed some acute confusion and stopped this with resolution. She resumed Miralax 1 capful daily since last visit too, seems to be doing better with BMs, still not daily, now soft last BM 3 days ago. Also coffee, herbal tea, OTC benafiber daily, increased water hydration. - Also regarding meds - she was given rx Baclofen 2/6 for OA/DJD, never took it, and not taking Biotin Vitamin E currently to avoid side effects - Also PMH hiatal hernia - Previously has history of epigastric pain in past with gastric pains, previously with pains on empty stomach like currently, improved with eating currently - Previously established with GI [facility], Dr Vira Agar - last one after age 80, perhaps about 9 years ago, has had prior questionable polyps, unsure about diagnosis of diverticulosis) - Additionally admits prior Left arm pain, intermittent episode then resolved, did not have chest pain, dyspnea, diaphoresis - Admits appetite improved - Denies active abdominal pain, nausea, vomiting, diarrhea, cramping, dark stools blood in stool, dysuria  CHRONIC DM, Type 2: Reports concerns about Glipizide,  problems with DM control, previously on Metformin few years ago did better, this was stopped due to CKD. Last A1c 7.4 (02/25/16). Since 02/2016 patient has stopped Glipizide a few times, unsure if it was helping her abdominal discomfort symptoms, however she had been on this med for many years without problem, she had been on Glyburide longer ago and this was switched. She is not interested in any injectable medication, but asking about change of med today CBGs: Avg off glipizide 150s, Low >100, High >170. Checks CBGs x 3 daily Meds: Glipizide IR '5mg'$  BID Reports  good compliance. Tolerating well w/o side-effects Currently on ACEi Denies hypoglycemia  PMH tubercular peritonitis  Social History  Substance Use Topics  . Smoking status: Never Smoker  . Smokeless tobacco: Never Used  . Alcohol use Yes    Review of Systems  Gastrointestinal: Positive for abdominal pain.   Per HPI unless specifically indicated above     Objective:    BP (!) 115/59   Pulse 70   Temp 98.6 F (37 C) (Oral)   Resp 16   Ht '4\' 11"'$  (1.499 m)   Wt 129 lb (58.5 kg)   BMI 26.05 kg/m   Wt Readings from Last 3 Encounters:  03/30/16 129 lb (58.5 kg)  03/16/16 131 lb (59.4 kg)  02/25/16 134 lb (60.8 kg)    Physical Exam  Constitutional: She is oriented to person, place, and time. She appears well-developed and well-nourished. No distress.  Well-appearing, comfortable, cooperative  HENT:  Head: Normocephalic and atraumatic.  Mouth/Throat: Oropharynx is clear and  moist.  Moist mucus mem. Dentures in place.  Eyes: Conjunctivae are normal.  Cardiovascular: Normal rate, regular rhythm, normal heart sounds and intact distal pulses.   No murmur heard. Pulmonary/Chest: Effort normal.  Abdominal: Soft. Bowel sounds are normal. She exhibits no distension and no mass. There is no tenderness (Non-tender, improved LLQ tenderness). There is no rebound and no guarding.  More regular bowel sounds  Negative Murphy's RUQ.  Non tender epigastric.  Musculoskeletal: She exhibits no edema.  Neurological: She is alert and oriented to person, place, and time.  Skin: Skin is warm and dry. No rash noted. She is not diaphoretic. No erythema.  Psychiatric: She has a normal mood and affect. Her behavior is normal.  Well groomed, good eye contact, normal speech and thoughts  Nursing note and vitals reviewed.  I have personally reviewed the following lab results from 02/26/16.  Results for orders placed or performed in visit on 02/26/16  COMPLETE METABOLIC PANEL WITH GFR  Result Value Ref Range   Sodium 138 135 - 146 mmol/L   Potassium 4.6 3.5 - 5.3 mmol/L   Chloride 104 98 - 110 mmol/L   CO2 27 20 - 31 mmol/L   Glucose, Bld 184 (H) 65 - 99 mg/dL   BUN 27 (H) 7 - 25 mg/dL   Creat 1.57 (H) 0.60 - 0.88 mg/dL   Total Bilirubin 0.4 0.2 - 1.2 mg/dL   Alkaline Phosphatase 34 33 - 130 U/L   AST 15 10 - 35 U/L   ALT 7 6 - 29 U/L   Total Protein 6.4 6.1 - 8.1 g/dL   Albumin 4.3 3.6 - 5.1 g/dL   Calcium 9.0 8.6 - 10.4 mg/dL   GFR, Est African American 35 (L) >=60 mL/min   GFR, Est Non African American 30 (L) >=60 mL/min  CBC with Differential/Platelet  Result Value Ref Range   WBC 6.5 3.8 - 10.8 K/uL   RBC 3.42 (L) 3.80 - 5.10 MIL/uL   Hemoglobin 10.5 (L) 11.7 - 15.5 g/dL   HCT 32.2 (L) 35.0 - 45.0 %   MCV 94.2 80.0 - 100.0 fL   MCH 30.7 27.0 - 33.0 pg   MCHC 32.6 32.0 - 36.0 g/dL   RDW 13.9 11.0 - 15.0 %   Platelets 286 140 - 400 K/uL   MPV 9.0 7.5 - 12.5 fL   Neutro Abs 3,575 1,500 - 7,800 cells/uL   Lymphs Abs 2,210 850 - 3,900 cells/uL   Monocytes Absolute 520 200 - 950 cells/uL   Eosinophils Absolute 130 15 - 500 cells/uL   Basophils Absolute 65 0 - 200 cells/uL   Neutrophils Relative % 55 %   Lymphocytes Relative 34 %   Monocytes Relative 8 %   Eosinophils Relative 2 %   Basophils Relative 1 %   Smear Review Criteria for review not met   Iron and TIBC  Result Value Ref Range   Iron 94 45 - 160 ug/dL     UIBC 229 125 - 400 ug/dL   TIBC 323 250 - 450 ug/dL   %SAT 29 11 - 50 %      Assessment & Plan:   Problem List Items Addressed This Visit    Intermittent left lower quadrant abdominal pain    Improved in interval, still has intermittent episodes, LUQ seems more consistent with GERD/Hiatal hernia, but then again has mixed symptoms had improved on Miralax. - Asymptomatic today, benign abdomen - KUB negative 2/26  Plan: 1. Stop Glipizide and  switch to Sitagliptin - unlikely sulfonylurea GI symptoms, but can go ahead and switch given patient reported side effects 2. Continue Miralalx 1 cap daily for now, may increase as needed titration 3. Continue hydration, OTC fiber 4. May add Docusate / Colace '100mg'$  nightly for now 5. May resume Bentyl PRN but avoid use 3-4 times daily due to side effects, geriatric patient with prior confusion on QID dosing. Now only use 1-2 times daily max if absolutely needed, precautions given 6. Strict return criteria given if acute worsening symptoms 7. Follow-up with [facility] Dr Vira Agar as planned in 1 month for further management      Diabetes type 2, controlled ([place]) - Primary    Stable, controlled, A1c 7.4 last check 02/2016 - now concern possible GI side effects on glipizide Complications - nephropathy with CKD III-IV, also in setting HTN - Contraindicated - Metformin / SGLT2. H/o failed Glyburide. Not interested in injectable  Plan:  1. Discontinue Glipizide - discussion on alternative management of DM, reviewed meds, limited options now, considered DPP4 as next oral option, can't use SGLT2 due to CKD, considered GLP1, hesitant due to injectable per patient, consider in future only if needed and worsening DM control 2. Start new Sitagliptin (Januvia) '25mg'$  daily with food, this low dose is MAX for patient due to CKD, GFR, cannot increase - if not tolerate or not effective in future can consider Amaryl as one other oral alternative 3. Future may consider  discussing low dose Metformin with Nephrology if needed 4. Cannot take ASA due to CKD, continue statin, on ACEi 5. Encouraged lifestyle improvements with DM diet, continue walking 6. Follow-up 2 months for DM visit, A1c, foot exam, check NCIR if due for next pneumonia vaccine, recommend ophthalmology (refer if needed)      Relevant Medications   sitaGLIPtin (JANUVIA) 25 MG tablet   CKD stage 3 due to type 2 diabetes mellitus ([place])    Awaiting new patient appointment in 04/2016 with [facility] - Today limited DM med options due to CKD, see A&P, can't use SGLT2 Metformin - Trial on low dose DPP4 inhib with Sitagliptin, '25mg'$  daily max dose due to GFR      Relevant Medications   sitaGLIPtin (JANUVIA) 25 MG tablet      Meds ordered this encounter  Medications  . Polyethylene Glycol 3350 (MIRALAX PO)    Sig: Take by mouth.  . sitaGLIPtin (JANUVIA) 25 MG tablet    Sig: Take 1 tablet (25 mg total) by mouth daily. Take with food    Dispense:  30 tablet    Refill:  2      Follow up plan: Return in about 2 months (around 05/30/2016) for diabetes.  Nobie Putnam, [facility] 03/30/2016, 1:56 PM

## 2016-03-30 NOTE — Assessment & Plan Note (Signed)
Stable, controlled, A1c 7.4 last check 02/2016 - now concern possible GI side effects on glipizide Complications - nephropathy with CKD III-IV, also in setting HTN - Contraindicated - Metformin / SGLT2. H/o failed Glyburide. Not interested in injectable  Plan:  1. Discontinue Glipizide - discussion on alternative management of DM, reviewed meds, limited options now, considered DPP4 as next oral option, can't use SGLT2 due to CKD, considered GLP1, hesitant due to injectable per patient, consider in future only if needed and worsening DM control 2. Start new Sitagliptin (Januvia) 25mg  daily with food, this low dose is MAX for patient due to CKD, GFR, cannot increase - if not tolerate or not effective in future can consider Amaryl as one other oral alternative 3. Future may consider discussing low dose Metformin with Nephrology if needed 4. Cannot take ASA due to CKD, continue statin, on ACEi 5. Encouraged lifestyle improvements with DM diet, continue walking 6. Follow-up 2 months for DM visit, A1c, foot exam, check NCIR if due for next pneumonia vaccine, recommend ophthalmology (refer if needed)

## 2016-03-30 NOTE — Patient Instructions (Addendum)
Thank you for coming in to clinic today.  1. For Diabetes - Stop Glipzide - i'm not sure if this was contributing to your abdominal discomfort, but regardless it is not ideal medication for controlling your diabetes - Start Sitagliptin (Januvia) 25mg  daily with food, this is a new class of medication that helps control diabetes, however this is the MAX dose we cannot increase it due to your kidney function, need to mention this to your new kidney doctor as well - Keep checking blood sugars  If not helping, and asking about going back to Glipizide - instead, CALL OFFICE, and we can discuss new med / Glimepiride (Amaryl)  2. For constipation - continue Miralax 1 cap daily, may go up on dose up to 1.5 to 2 caps for a few days if absolutely needed, otherwise this may cause more cramping and upset stomach - You may also try over the counter Colace (Docustate) 100mg  capsules nightly to help as stool softener - Milk of magnesia is not a safe option for your kidneys  Follow-up with [facility] 04/2016 and [facility] on 04/30/16 at 2:30pm Dr Vira Agar  Already scheduled in 2 months, follow-up  If you have any other questions or concerns, please feel free to call the clinic or send a message through [place]. You may also schedule an earlier appointment if necessary.  Nobie Putnam, DO [place]

## 2016-03-30 NOTE — Assessment & Plan Note (Addendum)
Improved in interval, still has intermittent episodes, LUQ seems more consistent with GERD/Hiatal hernia, but then again has mixed symptoms had improved on Miralax. - Asymptomatic today, benign abdomen - KUB negative 2/26  Plan: 1. Stop Glipizide and switch to Sitagliptin - unlikely sulfonylurea GI symptoms, but can go ahead and switch given patient reported side effects 2. Continue Miralalx 1 cap daily for now, may increase as needed titration 3. Continue hydration, OTC fiber 4. May add Docusate / Colace 100mg  nightly for now 5. May resume Bentyl PRN but avoid use 3-4 times daily due to side effects, geriatric patient with prior confusion on QID dosing. Now only use 1-2 times daily max if absolutely needed, precautions given 6. Strict return criteria given if acute worsening symptoms 7. Follow-up with [facility] Dr Vira Agar as planned in 1 month for further management

## 2016-04-14 ENCOUNTER — Other Ambulatory Visit: Payer: Self-pay | Admitting: Family Medicine

## 2016-04-14 DIAGNOSIS — E119 Type 2 diabetes mellitus without complications: Secondary | ICD-10-CM

## 2016-04-14 MED ORDER — ONETOUCH DELICA LANCETS 33G MISC: 1.0000 | Freq: Three times a day (TID) | 12 refills | Status: DC

## 2016-04-14 MED ORDER — GLUCOSE BLOOD VI STRP: ORAL_STRIP | 3 refills | Status: DC

## 2016-04-14 NOTE — Addendum Note (Signed)
Addended by: Devona Konig on: 04/14/2016 09:32 AM   Modules accepted: Orders

## 2016-04-27 DIAGNOSIS — E1122 Type 2 diabetes mellitus with diabetic chronic kidney disease: Secondary | ICD-10-CM | POA: Diagnosis not present

## 2016-04-27 DIAGNOSIS — N390 Urinary tract infection, site not specified: Secondary | ICD-10-CM | POA: Diagnosis not present

## 2016-04-27 DIAGNOSIS — N183 Chronic kidney disease, stage 3 (moderate): Secondary | ICD-10-CM | POA: Diagnosis not present

## 2016-04-30 DIAGNOSIS — K5900 Constipation, unspecified: Secondary | ICD-10-CM | POA: Diagnosis not present

## 2016-04-30 DIAGNOSIS — K219 Gastro-esophageal reflux disease without esophagitis: Secondary | ICD-10-CM | POA: Diagnosis not present

## 2016-05-05 DIAGNOSIS — N183 Chronic kidney disease, stage 3 (moderate): Secondary | ICD-10-CM | POA: Diagnosis not present

## 2016-05-08 DIAGNOSIS — M65311 Trigger thumb, right thumb: Secondary | ICD-10-CM | POA: Diagnosis not present

## 2016-05-11 DIAGNOSIS — D485 Neoplasm of uncertain behavior of skin: Secondary | ICD-10-CM | POA: Diagnosis not present

## 2016-05-11 DIAGNOSIS — C44311 Basal cell carcinoma of skin of nose: Secondary | ICD-10-CM | POA: Diagnosis not present

## 2016-05-11 DIAGNOSIS — L821 Other seborrheic keratosis: Secondary | ICD-10-CM | POA: Diagnosis not present

## 2016-05-11 DIAGNOSIS — L720 Epidermal cyst: Secondary | ICD-10-CM | POA: Diagnosis not present

## 2016-05-25 ENCOUNTER — Encounter: Payer: Self-pay | Admitting: Family Medicine

## 2016-05-25 ENCOUNTER — Ambulatory Visit (INDEPENDENT_AMBULATORY_CARE_PROVIDER_SITE_OTHER): Payer: PPO | Admitting: Family Medicine

## 2016-05-25 VITALS — BP 126/65 | HR 63 | Temp 98.8°F | Resp 16 | Ht 59.0 in | Wt 131.0 lb

## 2016-05-25 DIAGNOSIS — E1122 Type 2 diabetes mellitus with diabetic chronic kidney disease: Secondary | ICD-10-CM

## 2016-05-25 DIAGNOSIS — N183 Chronic kidney disease, stage 3 (moderate): Secondary | ICD-10-CM

## 2016-05-25 DIAGNOSIS — M47812 Spondylosis without myelopathy or radiculopathy, cervical region: Secondary | ICD-10-CM

## 2016-05-25 DIAGNOSIS — M62838 Other muscle spasm: Secondary | ICD-10-CM | POA: Insufficient documentation

## 2016-05-25 DIAGNOSIS — K5909 Other constipation: Secondary | ICD-10-CM | POA: Diagnosis not present

## 2016-05-25 LAB — POCT GLYCOSYLATED HEMOGLOBIN (HGB A1C): Hemoglobin A1C: 8.1 — AB (ref ?–5.7)

## 2016-05-25 NOTE — Assessment & Plan Note (Signed)
Recent worsening control A1c up to 8.1 (prior 7.4)  Complications - nephropathy with CKD III-IV, also in setting HTN - Contraindicated - Metformin / SGLT2. H/o failed Glyburide. Not interested in injectable  Plan:  1. Failed Januvia - now resumed sulfonylurea with Glipizide half tab AM / lunch / whole tab PM, concerns with potential for hypoglycemia on this regimen, will continue to discuss this in future, she is not interested in injectable, if elevating A1c advised will need to change next visit 2. Future may consider discussing low dose Metformin with Nephrology if needed 3. Cannot take ASA due to CKD, continue statin, on ACEi 4. Encouraged lifestyle improvements with DM diet, continue walking 5. Follow-up 3 months for Annual Physical - labs, A1c, will need foot exam, check NCIR if due for next pneumonia vaccine, she is to schedule optho soon

## 2016-05-25 NOTE — Patient Instructions (Addendum)
Thank you for coming to the clinic today.  1.   For neck / shoulder spasm and headaches  Recommend to start taking Tylenol Extra Strength 500mg  tabs - take 1 to 2 tabs per dose (max 1000mg ) every 6-8 hours for pain (take regularly, don't skip a dose for next 7 days), max 24 hour daily dose is 6 tablets or 3000mg . In the future you can repeat the same everyday Tylenol course for 1-2 weeks at a time.   Start taking Baclofen (Lioresal) 10mg  (muscle relaxant) - start with half (cut) to one whole pill at night as needed for next 1-3 nights (may make you drowsy, caution with driving) see how it affects you, then if tolerated increase to one pill 2 to 3 times a day or (every 8 hours as needed)  Recommend using heating pad and muscle rub.  ---- Elevated A1c today from 7 to 8  Continue current regimen Glipizide half tab in AM, half tab at lunch and whole at dinner  Ask your insurance to check coverage on these other medications GLP1 type injectables, before next visit  - Bydureon BCise (Exenatide ER) - weekly injectable  - Trulicity (Dulaglutide) - weekly injectable  - Victoza (Liraglutide) - daily injectable   You will be due for FASTING BLOOD WORK (no food or drink after midnight before, only water or coffee without cream/sugar on the morning of)  - Please go ahead and schedule a "Lab Only" visit in the morning at the clinic for lab draw in 3 months, 1-2 weeks before your Annual Physical - Make sure Lab Only appointment is at least 1-2 weeks before your next appointment, so that results will be available  For Lab Results, once available within 2-3 days of blood draw, you can can log in to MyChart online to view your results and a brief explanation. Also, we can discuss results at next follow-up visit.   Please schedule a follow-up appointment with Dr. Parks Ranger in 3 months for Annual Physical  If you have any other questions or concerns, please feel free to call the clinic or send a  message through [place]. You may also schedule an earlier appointment if necessary.  Nobie Putnam, DO [place]

## 2016-05-25 NOTE — Progress Notes (Signed)
Subjective:    Patient ID: Lisa Mills, female    DOB: 1929/08/15, 83 y.o.   MRN: [id number]  Lisa Mills is a 83 y.o. female presenting on 05/25/2016 for Diabetes (Highest BS 200 and lowest BS 130)   HPI   FOLLOW-UP / Left Abdominal Pain / CONSTIPATION: - Last apt discussed 03/30/16, see background history on this problem, she was advised to follow-up with saw [facility] 04/30/16, she has done overall much better, she did get - Taking Docusate (Colace) 100mg  every 2-3 days a week, Miralax half cap every 2-3 days to keep regular, now with improved bowel habits about once daily, sometimes may go over 1 day, still has some well formed stools, sometimes more fragments and balls but no straining - She had tried Dicyclomine with some relief, now not taking over past week, still has some at home if needed - Denies any constipation actively, abdominal pain, diarrhea, blood in stool or dark stools  CHRONIC DM, Type 2: - A1c increased now - She was rx Januvia 25mg  daily on 3/12, and she could not tolerated it, and she switched back to taking Glipzide, she has increased her dosing on this with improvement. Has declined injectable medications in past - Improving AM CBGs 130-140. Glucometer now is more accurate, has new meter. Checks CBG x 3 daily Meds: Glipizide IR 5mg  half tab in AM, half tab lunch and whole tab with dinner now Reports good compliance. Tolerating well w/o side-effects Currently on ACEi - Has ophthalmology Dr Wallace [facility] at [facility], will schedule apt soon Denies hypoglycemia  Intermittent Headache - Reports new complaint left sided, with some aching to touch, intermittent over past 1 month, seems to be new complaint, has known DJD in C-spine and neck/trapezius, tries to do regular upper body exercises to stretches, she has plenty of Baclofen left, she has only taken a few, usually at night and able to fall asleep, and wakes up with some improvement. - Now reports  -  Yesterday took half tab Baclofen due to headache - Taking Tylenol x 2 regular dose pills, without significant relief - Denies numbness, tingling, vision changes, nausea, vomiting   Social History  Substance Use Topics  . Smoking status: Never Smoker  . Smokeless tobacco: Never Used  . Alcohol use Yes    Review of Systems Per HPI unless specifically indicated above     Objective:    BP 126/65   Pulse 63   Temp 98.8 F (37.1 C) (Oral)   Resp 16   Ht 4\' 11"  (1.499 m)   Wt 131 lb (59.4 kg)   BMI 26.46 kg/m   Wt Readings from Last 3 Encounters:  05/25/16 131 lb (59.4 kg)  03/30/16 129 lb (58.5 kg)  03/16/16 131 lb (59.4 kg)    Physical Exam  Constitutional: She is oriented to person, place, and time. She appears well-developed and well-nourished. No distress.  Well-appearing, comfortable, cooperative  HENT:  Head: Normocephalic and atraumatic.  Mouth/Throat: Oropharynx is clear and moist.  Eyes: Conjunctivae are normal. Right eye exhibits no discharge. Left eye exhibits no discharge.  Neck: Normal range of motion. No thyromegaly present.  Neck Inspection: symmetrical normal appearing Palpation: mild paraspinal and trapezius hypertonicity L > R ROM: mostly intact FROM Strength: 5/5 distal upper extremities Neurovascular: sensation intact  Cardiovascular: Normal rate, regular rhythm, normal heart sounds and intact distal pulses.   No murmur heard. Pulmonary/Chest: Effort normal.  Musculoskeletal: She exhibits no edema.  Lymphadenopathy:  She has no cervical adenopathy.  Neurological: She is alert and oriented to person, place, and time.  Skin: Skin is warm and dry. No rash noted. She is not diaphoretic. No erythema.  Psychiatric: She has a normal mood and affect. Her behavior is normal.  Nursing note and vitals reviewed.   I have personally reviewed the following lab results from 02/2016.   Results for orders placed or performed in visit on 05/25/16  POCT HgB  A1C  Result Value Ref Range   Hemoglobin A1C 8.1 (A) 5.7      Assessment & Plan:   Problem List Items Addressed This Visit    Trapezius muscle spasm    Likely secondary to chronic DJD C-spine MSK etiology may affect neck pain and headaches - see A&P, Tylenol, Baclofen, heating pad, she is not maximizing conservative therapy by report      DJD (degenerative joint disease) of cervical spine    Stable DJD C-spine and multiple joints with chronic recurrent flares, currently well without flare. Likely etiology for some headaches. Followed by [facility], last imaging MRI 2006 with C5-6 and C6-7 DJD spinal stenosis, may have updated imaging on outside records - Inadequate conservative treatments at home with limited Tylenol use, contraindicated NSAIDs, intolerant of Tramadol  Plan: 1. Not adhering to prior increased Tylenol dosing up to 1000mg  TID PRN as discussed, reviewed this again today 2. May increase baclofen PRN use as tolerated, prefer nightly to help with sleep as well 3. Continue follow-up with [facility] as needed 4. Future consider PT if needed      Diabetes type 2, controlled ([place]) - Primary    Recent worsening control A1c up to 8.1 (prior 7.4)  Complications - nephropathy with CKD III-IV, also in setting HTN - Contraindicated - Metformin / SGLT2. H/o failed Glyburide. Not interested in injectable  Plan:  1. Failed Januvia - now resumed sulfonylurea with Glipizide half tab AM / lunch / whole tab PM, concerns with potential for hypoglycemia on this regimen, will continue to discuss this in future, she is not interested in injectable, if elevating A1c advised will need to change next visit 2. Future may consider discussing low dose Metformin with Nephrology if needed 3. Cannot take ASA due to CKD, continue statin, on ACEi 4. Encouraged lifestyle improvements with DM diet, continue walking 5. Follow-up 3 months for Annual Physical - labs, A1c, will need foot exam,  check NCIR if due for next pneumonia vaccine, she is to schedule optho soon      Relevant Medications   glipiZIDE (GLUCOTROL) 5 MG tablet   Other Relevant Orders   POCT HgB A1C (Completed)   Constipation    Improved on regular miralax and and colace dosing Followed by GI          Follow up plan: Return in about 3 months (around 08/25/2016) for Annual Physical.  Nobie Putnam, DO [facility] 05/25/2016, 11:26 PM

## 2016-05-25 NOTE — Assessment & Plan Note (Addendum)
Stable DJD C-spine and multiple joints with chronic recurrent flares, currently well without flare. Likely etiology for some headaches. Followed by [facility], last imaging MRI 2006 with C5-6 and C6-7 DJD spinal stenosis, may have updated imaging on outside records - Inadequate conservative treatments at home with limited Tylenol use, contraindicated NSAIDs, intolerant of Tramadol  Plan: 1. Not adhering to prior increased Tylenol dosing up to 1000mg  TID PRN as discussed, reviewed this again today 2. May increase baclofen PRN use as tolerated, prefer nightly to help with sleep as well 3. Continue follow-up with [facility] as needed 4. Future consider PT if needed

## 2016-05-25 NOTE — Assessment & Plan Note (Signed)
Improved on regular miralax and and colace dosing Followed by GI

## 2016-05-25 NOTE — Assessment & Plan Note (Signed)
Likely secondary to chronic DJD C-spine MSK etiology may affect neck pain and headaches - see A&P, Tylenol, Baclofen, heating pad, she is not maximizing conservative therapy by report

## 2016-05-26 ENCOUNTER — Other Ambulatory Visit: Payer: Self-pay | Admitting: Family Medicine

## 2016-05-26 DIAGNOSIS — Z0000 Encounter for general adult medical examination without abnormal findings: Secondary | ICD-10-CM

## 2016-05-26 DIAGNOSIS — N183 Chronic kidney disease, stage 3 (moderate): Secondary | ICD-10-CM

## 2016-05-26 DIAGNOSIS — D631 Anemia in chronic kidney disease: Secondary | ICD-10-CM

## 2016-05-26 DIAGNOSIS — I10 Essential (primary) hypertension: Secondary | ICD-10-CM

## 2016-05-26 DIAGNOSIS — E1122 Type 2 diabetes mellitus with diabetic chronic kidney disease: Secondary | ICD-10-CM

## 2016-05-26 DIAGNOSIS — E785 Hyperlipidemia, unspecified: Secondary | ICD-10-CM

## 2016-05-26 DIAGNOSIS — E1169 Type 2 diabetes mellitus with other specified complication: Secondary | ICD-10-CM

## 2016-06-23 DIAGNOSIS — C44311 Basal cell carcinoma of skin of nose: Secondary | ICD-10-CM | POA: Diagnosis not present

## 2016-06-23 DIAGNOSIS — C44319 Basal cell carcinoma of skin of other parts of face: Secondary | ICD-10-CM | POA: Diagnosis not present

## 2016-06-30 DIAGNOSIS — L578 Other skin changes due to chronic exposure to nonionizing radiation: Secondary | ICD-10-CM | POA: Diagnosis not present

## 2016-06-30 DIAGNOSIS — L821 Other seborrheic keratosis: Secondary | ICD-10-CM | POA: Diagnosis not present

## 2016-06-30 DIAGNOSIS — L720 Epidermal cyst: Secondary | ICD-10-CM | POA: Diagnosis not present

## 2016-07-03 DIAGNOSIS — K219 Gastro-esophageal reflux disease without esophagitis: Secondary | ICD-10-CM | POA: Diagnosis not present

## 2016-07-03 DIAGNOSIS — K5900 Constipation, unspecified: Secondary | ICD-10-CM | POA: Diagnosis not present

## 2016-07-03 DIAGNOSIS — I10 Essential (primary) hypertension: Secondary | ICD-10-CM | POA: Diagnosis not present

## 2016-07-07 ENCOUNTER — Encounter: Payer: Self-pay | Admitting: Family Medicine

## 2016-07-07 ENCOUNTER — Ambulatory Visit (INDEPENDENT_AMBULATORY_CARE_PROVIDER_SITE_OTHER): Payer: PPO | Admitting: Family Medicine

## 2016-07-07 VITALS — BP 136/56 | HR 75 | Temp 98.4°F | Resp 16 | Ht 59.0 in | Wt 130.0 lb

## 2016-07-07 DIAGNOSIS — I10 Essential (primary) hypertension: Secondary | ICD-10-CM | POA: Diagnosis not present

## 2016-07-07 DIAGNOSIS — R42 Dizziness and giddiness: Secondary | ICD-10-CM

## 2016-07-07 MED ORDER — LISINOPRIL 5 MG PO TABS: 5.0000 mg | ORAL_TABLET | Freq: Every day | ORAL | 5 refills | Status: DC

## 2016-07-07 NOTE — Assessment & Plan Note (Signed)
Well-controlled HTN, now off all meds including Lisinopril - Home BP readings normal Complication with CKD III followed by Nephrology [facility] Dr Candiss Norse   Plan:  1. Advised she may remain off Lisinopril 20mg  daily due to concern with dizziness - now improved off this med. New rx written for Lisinopril 5mg  daily - lower dose, still renal protection, may not need stronger HTN control. Advised her to discuss this dosing with her Nephrology next visit 2. Encourage improved lifestyle - low sodium diet, regular exercise 3. Continue monitor BP outside office, bring readings to next visit, if persistently >140/90 or new symptoms notify office sooner 4. Follow-up as planned within 2 months annual physical

## 2016-07-07 NOTE — Patient Instructions (Addendum)
Thank you for coming to the clinic today  1. I do not have the exact cause of your symptoms particularly the dizziness. - Most likely due to medication side effect - go ahead and STOP Lisinopril 20mg  daily - and start new rx Lisinopril 5mg  daily - more for protecting kidneys and less for blood pressure. - Keep checking BP write down readings, if >140/90 consistently then let us know  Please discuss Lisinopril and dosage with Dr Candiss Norse - My input is that you probably need at least this low dose to protect the kidneys, but see what he says from his perspective.  2. Also possible dizziness or symptoms from the Glipizide side effect - this can lower blood sugar and cause dizziness  - Try to reduce PM dose to HALF pill - so take half pill 3 times daily with food. And then again if still not fully improved, can HOLD one of the doses. And only take twice daily - In future we can consider alternatives  EKG Looks fine, no concerns on this. I compared it to the one from 2014. No changes.  3. For vitamin D you probably will have to lower your dose to Vitamin D3 5,000 daily or every other day to see if this helps reduce indigestion  Please schedule a Follow-up Appointment to: Return for Annual Physical.  If you have any other questions or concerns, please feel free to call the clinic or send a message through [place]. You may also schedule an earlier appointment if necessary.  Additionally, you may be receiving a survey about your experience at our clinic within a few days to 1 week by e-mail or mail. We value your feedback.  Nobie Putnam, DO [place]

## 2016-07-07 NOTE — Progress Notes (Signed)
Subjective:    Patient ID: Lisa Mills, female    DOB: [birth year], 83 y.o.   MRN: [id number]  Lisa Mills is a 83 y.o. female presenting on 07/07/2016 for Hypertension (obtw pt had seen gastro for her indigestion they have done EKG within [facility] wanted her to follow up with PCP)   HPI   INTERMITTENT DIZZINESS / CHRONIC HTN / DM2 Reports that over past few months some concern with intermittent dizziness, she checks her BP regularly now (most days with average readings occasional low 80-100s up to 130s SBP, normal DBP, HR normal 60s to 80s) - Additional history had EKG done 07/03/16 by [facility] with these concerns Current Meds - None - (she stopped Lisinopril 20mg  daily about 1 month ago, she was significantly improved after stopping this one without further dizziness, until last night).   Lifestyle - Also recent stressor with loss of family member - Last visit 05/2016 with diabetes follow-up she was on Glipzide half tab in AM and half at lunch, and whole at dinner Denies CP, dyspnea, HA, edema, lightheadedness, vertigo  FOLLOW-UP / Left Abdominal Pain / CONSTIPATION: - Previously followed for this problem in past, see prior notes. Followed by [facility] - Symptoms had improved, for while, but then had returned periodically. She was to take Omeprazole in AM and take Zantac at PM. Today she chewed some tums today, with improvement, then intermittent recurrence throughout the day. Some occurrence today. Attributes this to vitamin D3 50,000 unit weekly she was taking, and told that this could cause indigestion, took it recently still in her system - She is asking about hiatal hernia, and "pinching" symptoms - Taking Docusate and Miralax PRN - She had tried Dicyclomine with some relief, now not taking over past week, still has some at home if needed - Denies any constipation actively, abdominal pain, diarrhea, blood in stool or dark stools   Social History  Substance Use  Topics  . Smoking status: Never Smoker  . Smokeless tobacco: Never Used  . Alcohol use Yes    Review of Systems Per HPI unless specifically indicated above     Objective:    BP (!) 136/56   Pulse 75   Temp 98.4 F (36.9 C) (Oral)   Resp 16   Ht 4\' 11"  (1.499 m)   Wt 130 lb (59 kg)   BMI 26.26 kg/m   Wt Readings from Last 3 Encounters:  07/07/16 130 lb (59 kg)  05/25/16 131 lb (59.4 kg)  03/30/16 129 lb (58.5 kg)    Physical Exam  Constitutional: She is oriented to person, place, and time. She appears well-developed and well-nourished. No distress.  Well-appearing 83 year old female, comfortable, cooperative  HENT:  Head: Normocephalic and atraumatic.  Mouth/Throat: Oropharynx is clear and moist.  Eyes: Conjunctivae are normal. Right eye exhibits no discharge. Left eye exhibits no discharge.  Neck: Normal range of motion. Neck supple.  Cardiovascular: Normal rate, regular rhythm, normal heart sounds and intact distal pulses.   No murmur heard. Pulmonary/Chest: Effort normal and breath sounds normal. No respiratory distress. She has no wheezes. She has no rales.  Musculoskeletal: She exhibits no edema.  Neurological: She is alert and oriented to person, place, and time.  Skin: Skin is warm and dry. No rash noted. She is not diaphoretic. No erythema.  Psychiatric: She has a normal mood and affect. Her behavior is normal. Thought content normal.  Well groomed, good eye contact, normal speech and  thoughts  Nursing note and vitals reviewed.      Date: 07/03/16  Rate: 69  Rhythm: normal sinus rhythm  QRS Axis: normal  Intervals: normal  ST/T Wave abnormalities: normal  Conduction Disutrbances:none  Narrative Interpretation:   Old EKG Reviewed: 06/28/12 without significant change  Results for orders placed or performed in visit on 05/25/16  POCT HgB A1C  Result Value Ref Range   Hemoglobin A1C 8.1 (A) 5.7      Assessment & Plan:   Problem List Items Addressed  This Visit    Hypertension - Primary    Well-controlled HTN, now off all meds including Lisinopril - Home BP readings normal Complication with CKD III followed by Nephrology [facility] Dr Candiss Norse   Plan:  1. Advised she may remain off Lisinopril 20mg  daily due to concern with dizziness - now improved off this med. New rx written for Lisinopril 5mg  daily - lower dose, still renal protection, may not need stronger HTN control. Advised her to discuss this dosing with her Nephrology next visit 2. Encourage improved lifestyle - low sodium diet, regular exercise 3. Continue monitor BP outside office, bring readings to next visit, if persistently >140/90 or new symptoms notify office sooner 4. Follow-up as planned within 2 months annual physical      Relevant Medications   lisinopril (PRINIVIL,ZESTRIL) 5 MG tablet    Other Visit Diagnoses    Episode of dizziness     - Suspect dizziness may be multifactorial, could be due to lower BP with HTN or maybe related to regular sulfonylurea dosing with glipizide, or other etiologies, hydration status, vs med side effect  Plan: 1. Reduce dose Lisinopril 20 to 5mg  daily 2. Monitor BP 3. Improve hydration 4. Reduce Glipizide to half tab TID, then may switch to half tab BID - discussed prefer to switch off sulfonylurea in future, limited options due to CKD, may consider monotherapy with GLP1 5. Follow-up as planned, sooner if worse      Meds ordered this encounter  Medications  . docusate sodium (COLACE) 100 MG capsule    Sig: Take by mouth.  . ranitidine (ZANTAC) 150 MG tablet    Sig: Take by mouth.  . DISCONTD: Vitamin D, Ergocalciferol, (DRISDOL) 50000 units CAPS capsule    Sig: Take by mouth.  . DISCONTD: omeprazole (PRILOSEC) 20 MG capsule    Sig: Take by mouth.  Marland Kitchen lisinopril (PRINIVIL,ZESTRIL) 5 MG tablet    Sig: Take 1 tablet (5 mg total) by mouth daily.    Dispense:  30 tablet    Refill:  5    Follow up plan: Return for Annual  Physical.  Nobie Putnam, DO [facility] 07/07/2016, 11:21 PM

## 2016-08-07 DIAGNOSIS — E119 Type 2 diabetes mellitus without complications: Secondary | ICD-10-CM | POA: Diagnosis not present

## 2016-08-07 LAB — HM DIABETES EYE EXAM

## 2016-08-13 ENCOUNTER — Other Ambulatory Visit: Payer: Self-pay | Admitting: Nurse Practitioner

## 2016-08-13 DIAGNOSIS — D649 Anemia, unspecified: Secondary | ICD-10-CM | POA: Diagnosis not present

## 2016-08-13 DIAGNOSIS — R1012 Left upper quadrant pain: Secondary | ICD-10-CM | POA: Diagnosis not present

## 2016-08-13 DIAGNOSIS — K219 Gastro-esophageal reflux disease without esophagitis: Secondary | ICD-10-CM | POA: Diagnosis not present

## 2016-08-13 DIAGNOSIS — K5900 Constipation, unspecified: Secondary | ICD-10-CM | POA: Diagnosis not present

## 2016-08-14 ENCOUNTER — Telehealth: Payer: Self-pay

## 2016-08-14 DIAGNOSIS — E785 Hyperlipidemia, unspecified: Secondary | ICD-10-CM

## 2016-08-14 DIAGNOSIS — N183 Chronic kidney disease, stage 3 unspecified: Secondary | ICD-10-CM

## 2016-08-14 DIAGNOSIS — R1012 Left upper quadrant pain: Secondary | ICD-10-CM | POA: Diagnosis not present

## 2016-08-14 DIAGNOSIS — E038 Other specified hypothyroidism: Secondary | ICD-10-CM

## 2016-08-14 DIAGNOSIS — D631 Anemia in chronic kidney disease: Secondary | ICD-10-CM

## 2016-08-14 DIAGNOSIS — E1122 Type 2 diabetes mellitus with diabetic chronic kidney disease: Secondary | ICD-10-CM

## 2016-08-14 DIAGNOSIS — K219 Gastro-esophageal reflux disease without esophagitis: Secondary | ICD-10-CM | POA: Diagnosis not present

## 2016-08-14 DIAGNOSIS — E1169 Type 2 diabetes mellitus with other specified complication: Secondary | ICD-10-CM

## 2016-08-14 DIAGNOSIS — K5900 Constipation, unspecified: Secondary | ICD-10-CM | POA: Diagnosis not present

## 2016-08-14 DIAGNOSIS — E063 Autoimmune thyroiditis: Secondary | ICD-10-CM

## 2016-08-14 DIAGNOSIS — D649 Anemia, unspecified: Secondary | ICD-10-CM | POA: Diagnosis not present

## 2016-08-14 DIAGNOSIS — I10 Essential (primary) hypertension: Secondary | ICD-10-CM

## 2016-08-14 NOTE — Telephone Encounter (Signed)
Reviewed chart and CareEverywhere, I do not see any record of recent GI [facility] visit or blood test results, last was 07/03/16 before I saw her on 07/07/16. It may have not updated yet. I do not have any fax results or office note either.  I Called patient back and reviewed with her, she did not write down lab result. One was potassium, previously she was normal. Most likely it was her Creatinine which has been abnormal for a while, with CKD-III and she is followed by Nephrology Dr Candiss Norse who she is scheduled to see in few weeks.  I advised her that GI would be responsible for making arrangements for any abnormal lab from their blood test but that I will plan to re-check the labs as scheduled on 08/19/16, and have updated her orders with future labs ready to be drawn. If we find out any new information on this result between now and then we can update her, otherwise, we will review her upcoming results.  She is scheduled for some [place] test by GI soon.  Nobie Putnam, [facility] 08/14/2016, 5:38 PM

## 2016-08-14 NOTE — Telephone Encounter (Signed)
Patient called reporting that she was seen by [facility].  They called her today with lab results and they requested for you to review labs and recheck potassium and one other lab (Patient could not remember the other one, but she said you would see).  She has a lab visit coming up and would like to have a recheck on abnormal labs.  Please advise

## 2016-08-17 ENCOUNTER — Encounter: Payer: Self-pay | Admitting: Family Medicine

## 2016-08-17 ENCOUNTER — Ambulatory Visit
Admission: RE | Admit: 2016-08-17 | Discharge: 2016-08-17 | Disposition: A | Discharge status: Home / Self Care | Payer: PPO | Source: Ambulatory Visit | Attending: Nurse Practitioner | Admitting: Nurse Practitioner

## 2016-08-17 DIAGNOSIS — N281 Cyst of kidney, acquired: Secondary | ICD-10-CM | POA: Diagnosis not present

## 2016-08-17 DIAGNOSIS — K219 Gastro-esophageal reflux disease without esophagitis: Secondary | ICD-10-CM | POA: Insufficient documentation

## 2016-08-17 DIAGNOSIS — R1012 Left upper quadrant pain: Secondary | ICD-10-CM | POA: Insufficient documentation

## 2016-08-17 DIAGNOSIS — R1011 Right upper quadrant pain: Secondary | ICD-10-CM | POA: Insufficient documentation

## 2016-08-18 DIAGNOSIS — K219 Gastro-esophageal reflux disease without esophagitis: Secondary | ICD-10-CM | POA: Diagnosis not present

## 2016-08-19 ENCOUNTER — Other Ambulatory Visit: Payer: PPO

## 2016-08-19 ENCOUNTER — Other Ambulatory Visit: Payer: Self-pay | Admitting: Family Medicine

## 2016-08-19 DIAGNOSIS — E785 Hyperlipidemia, unspecified: Secondary | ICD-10-CM | POA: Diagnosis not present

## 2016-08-19 DIAGNOSIS — I10 Essential (primary) hypertension: Secondary | ICD-10-CM | POA: Diagnosis not present

## 2016-08-19 DIAGNOSIS — E1169 Type 2 diabetes mellitus with other specified complication: Secondary | ICD-10-CM | POA: Diagnosis not present

## 2016-08-19 DIAGNOSIS — Z0000 Encounter for general adult medical examination without abnormal findings: Secondary | ICD-10-CM | POA: Diagnosis not present

## 2016-08-19 DIAGNOSIS — D631 Anemia in chronic kidney disease: Secondary | ICD-10-CM | POA: Diagnosis not present

## 2016-08-19 DIAGNOSIS — E1122 Type 2 diabetes mellitus with diabetic chronic kidney disease: Secondary | ICD-10-CM | POA: Diagnosis not present

## 2016-08-19 DIAGNOSIS — N183 Chronic kidney disease, stage 3 (moderate): Secondary | ICD-10-CM | POA: Diagnosis not present

## 2016-08-19 LAB — CBC WITH DIFFERENTIAL/PLATELET
BASOS PCT: 1 %
Basophils Absolute: 63 cells/uL (ref 0–200)
Eosinophils Absolute: 252 cells/uL (ref 15–500)
Eosinophils Relative: 4 %
HCT: 34.8 % — ABNORMAL LOW (ref 35.0–45.0)
Hemoglobin: 11.2 g/dL — ABNORMAL LOW (ref 11.7–15.5)
Lymphocytes Relative: 31 %
Lymphs Abs: 1953 cells/uL (ref 850–3900)
MCH: 30 pg (ref 27.0–33.0)
MCHC: 32.2 g/dL (ref 32.0–36.0)
MCV: 93.3 fL (ref 80.0–100.0)
MONO ABS: 630 {cells}/uL (ref 200–950)
MONOS PCT: 10 %
MPV: 8.8 fL (ref 7.5–12.5)
NEUTROS ABS: 3402 {cells}/uL (ref 1500–7800)
Neutrophils Relative %: 54 %
PLATELETS: 344 10*3/uL (ref 140–400)
RBC: 3.73 MIL/uL — ABNORMAL LOW (ref 3.80–5.10)
RDW: 13.9 % (ref 11.0–15.0)
WBC: 6.3 10*3/uL (ref 3.8–10.8)

## 2016-08-19 LAB — TSH: TSH: 2.91 mIU/L

## 2016-08-20 LAB — BASIC METABOLIC PANEL WITH GFR
BUN: 21 mg/dL (ref 7–25)
CALCIUM: 9.6 mg/dL (ref 8.6–10.4)
CHLORIDE: 104 mmol/L (ref 98–110)
CO2: 21 mmol/L (ref 20–31)
Creat: 1.43 mg/dL — ABNORMAL HIGH (ref 0.60–0.88)
GFR, EST NON AFRICAN AMERICAN: 34 mL/min — AB (ref >=60)
GFR, Est African American: 39 mL/min — ABNORMAL LOW (ref >=60)
Glucose, Bld: 102 mg/dL — ABNORMAL HIGH (ref 65–99)
Potassium: 4.4 mmol/L (ref 3.5–5.3)
SODIUM: 142 mmol/L (ref 135–146)

## 2016-08-20 LAB — LIPID PANEL
CHOL/HDL RATIO: 3.2 ratio (ref <5.0)
Cholesterol: 196 mg/dL (ref <200)
HDL: 62 mg/dL (ref >50)
LDL Cholesterol: 108 mg/dL — ABNORMAL HIGH (ref <100)
TRIGLYCERIDES: 128 mg/dL (ref <150)
VLDL: 26 mg/dL (ref <30)

## 2016-08-20 LAB — HEMOGLOBIN A1C
Hgb A1c MFr Bld: 6.8 % — ABNORMAL HIGH (ref <5.7)
Mean Plasma Glucose: 148 mg/dL

## 2016-08-24 ENCOUNTER — Other Ambulatory Visit: Payer: Self-pay | Admitting: Family Medicine

## 2016-08-25 ENCOUNTER — Ambulatory Visit (INDEPENDENT_AMBULATORY_CARE_PROVIDER_SITE_OTHER): Payer: PPO | Admitting: Family Medicine

## 2016-08-25 ENCOUNTER — Encounter: Payer: Self-pay | Admitting: Family Medicine

## 2016-08-25 VITALS — BP 114/64 | HR 72 | Temp 98.3°F | Resp 16 | Ht 59.0 in | Wt 120.8 lb

## 2016-08-25 DIAGNOSIS — N644 Mastodynia: Secondary | ICD-10-CM

## 2016-08-25 DIAGNOSIS — E1122 Type 2 diabetes mellitus with diabetic chronic kidney disease: Secondary | ICD-10-CM

## 2016-08-25 DIAGNOSIS — Z780 Asymptomatic menopausal state: Secondary | ICD-10-CM | POA: Diagnosis not present

## 2016-08-25 DIAGNOSIS — I10 Essential (primary) hypertension: Secondary | ICD-10-CM

## 2016-08-25 DIAGNOSIS — Z23 Encounter for immunization: Secondary | ICD-10-CM

## 2016-08-25 DIAGNOSIS — N183 Chronic kidney disease, stage 3 unspecified: Secondary | ICD-10-CM

## 2016-08-25 DIAGNOSIS — E1169 Type 2 diabetes mellitus with other specified complication: Secondary | ICD-10-CM

## 2016-08-25 DIAGNOSIS — E785 Hyperlipidemia, unspecified: Secondary | ICD-10-CM | POA: Diagnosis not present

## 2016-08-25 DIAGNOSIS — F418 Other specified anxiety disorders: Secondary | ICD-10-CM

## 2016-08-25 MED ORDER — ESCITALOPRAM OXALATE 5 MG PO TABS: 5.0000 mg | ORAL_TABLET | Freq: Every day | ORAL | 2 refills | Status: DC

## 2016-08-25 NOTE — Assessment & Plan Note (Signed)
Well-controlled HTN even off all meds including Lisinopril - Home BP readings normal, concern even mild low while on newly low dose added lisinopril 5 instead of 20 Complication with CKD III followed by Nephrology [facility] Dr Candiss Norse   Plan:  1. Reviewed BP logs- remain off all anti-HTN therapy for now monitor BP - discuss ACEi with Nephrology 2. Encourage improved lifestyle - low sodium diet, continue walking regular exercise 3. Continue monitor BP outside office, bring readings to next visit, if persistently >140/90 or new symptoms notify office sooner 4. Follow-up q 3-6 months

## 2016-08-25 NOTE — Progress Notes (Signed)
Subjective:    Patient ID: Lisa Mills, female    DOB: [birth year], 83 y.o.   MRN: [id number]  Lisa Mills is a 83 y.o. female presenting on 08/25/2016 for Annual Exam   HPI   Here for Annual Physical and Lab Result Review.  CHRONIC DM, Type 2: Improved A1c from 8.1 to 6.8, improved diet for past 3 weeks, following low K DM bland diet, not her favorite but seems to be feeling better on it. She has detailed CBG log, stopped Januvia and has better controlled CBG on Glipizide. Declines injectables and goal to avoid insulin - She does admit does not feel hypoglycemia symptoms well CBGs: log shows appropriate 90-120s well control on Glipizide, worsening control on Januvia monotherapy 130-150s Meds: Glipizide IR 87m half tab in AM, half tab lunch and whole tab with dinner now - asking about reducing dose Reports good compliance. Tolerating well w/o side-effects Was on ACEi Lifestyle: - Diet: improved, bland low K DM diet per GI - Exercise: significantly increased vigorous walking, with very good results - [facility]07[facility]16[facility]  Hypothyroidism Last TSH normal range. She is taking Levothyroxine 23m daily.  HYPERLIPIDEMIA: - Reports no concerns. Last lipid panel 07/2016, mostly controlled - Currently taking Simvastatin 4078mtolerating well without side effects or myalgias. She does admit to forgetting dose occasionally.  FOLLOW-UP HTN Checks BP at home, has very detailed log today for review. She had controlled BP, but then for while she held Lisinopril due to concerns with GI for HyperK, also her concerns were with BP possibly "too low" with ranging low 100s. Off lisinopril her readings were actually stable 110-120s. Current Meds - None (remains off Lisinopril 5mg27mAnxiety / Depression, mixed vs Adjustment Disorder: - Reports new complaint today in addition to physical. She has had worsening stress/anxiety/mood over  several weeks to months, mostly with factors of providing care to her husband who is chronically ill and has been in and out of hospital. Also her dog was ill and had seizures, she had to put it down. In past she was on Fluoxetine but did not tolerate gave her side effects, thin hair and wt gain, she would like to consider an alternative med today  Health Maintenance: - Due for pneumonia vaccine, PPSV23, last vaccine prevnar-13 (11/2012) - Due for mammogram, last 2015, she has requested to continue screening and also has complaint of L sided breast glandular pain, similar pain in past, has been benign, requesting mammogram orders for [place], also will check [place] to diagnostic mammo for pain - Due for DEXA scan osteoporosis screening, no prior DEXA, no known fractures due to osteoporosis - Due for flu shot in Fall 2018  Depression screen PHQ [facility] 08/25/2016 02/25/2016  Decreased Interest 1 0  Down, Depressed, Hopeless 2 0  PHQ - 2 Score 3 0  Altered sleeping 2 -  Tired, decreased energy 1 -  Change in appetite 2 -  Feeling bad or failure about yourself  1 -  Trouble concentrating 2 -  Moving slowly or fidgety/restless 2 -  Suicidal thoughts 0 -  PHQ-9 Score 13 -   GAD 7 : Generalized Anxiety Score 08/25/2016  Nervous, Anxious, on Edge 3  Control/stop worrying 2  Worry too much - different things 2  Trouble relaxing 1  Restless 1  Easily annoyed or irritable 2  Afraid - awful might happen 2  Total GAD 7 Score 13  Past Medical History:  Diagnosis Date  . Arthritis   . Chronic kidney disease   . Dermatophytosis of nail   . Diabetes type 2, controlled ([place])   . GERD (gastroesophageal reflux disease)   . Hyperlipidemia   . Hypertension   . Keratoderma   . Shingles    Past Surgical History:  Procedure Laterality Date  . cataracts    . HAND SURGERY    . KIDNEY STONE SURGERY    . TONSILECTOMY, ADENOIDECTOMY, BILATERAL MYRINGOTOMY AND TUBES    . TRIGGER FINGER RELEASE      Left ring finger  . tubercular peritonitis     Social History   Social History  . Marital status: Married    Spouse name: N/A  . Number of children: 2  . Years of education: N/A   Occupational History  . Not on file.   Social History Main Topics  . Smoking status: Never Smoker  . Smokeless tobacco: Never Used  . Alcohol use Yes  . Drug use: No  . Sexual activity: Not on file   Other Topics Concern  . Not on file   Social History Narrative  . No narrative on file   Family History  Problem Relation Age of Onset  . Adopted: Yes   Current Outpatient Prescriptions on File Prior to Visit  Medication Sig  . baclofen (LIORESAL) 10 MG tablet Take 0.5-1 tablets (5-10 mg total) by mouth at bedtime as needed for muscle spasms.  . Biotin 1000 MCG tablet Take by mouth.  . Blood Glucose Monitoring Suppl ([place]) w/Device KIT Use glucometer to check blood sugar daily.  Marland Kitchen dicyclomine (BENTYL) 10 MG capsule Take 1 capsule (10 mg total) by mouth 4 (four) times daily -  before meals and at bedtime.  . docusate sodium (COLACE) 100 MG capsule Take by mouth.  Marland Kitchen glipiZIDE (GLUCOTROL) 5 MG tablet Take 2.5 mg by mouth 2 (two) times daily with a meal. Take half tab  . glucose blood test strip Check blood sugar up to 3 x daily. Dx:E11.9  . levothyroxine (SYNTHROID, LEVOTHROID) 25 MCG tablet TAKE 1 TABLET(25 MCG) BY MOUTH EVERY DAY 30 TO 60 MINUTES BEFORE BREAKFAST ON AN EMPTY STOMACH AND WITH A GLASS OF WATER  . omeprazole (PRILOSEC) 20 MG capsule Take 1 capsule (20 mg total) by mouth daily.  Glory Rosebush DELICA LANCETS 56C MISC 1 each by Does not apply route 3 (three) times daily. Dx:E11.9  . Polyethylene Glycol 3350 (MIRALAX PO) Take by mouth.  . ranitidine (ZANTAC) 150 MG tablet Take by mouth.  . simvastatin (ZOCOR) 40 MG tablet Take 1 tablet (40 mg total) by mouth at bedtime.  . vitamin E 400 UNIT capsule Take by mouth.  . Wheat Dextrin (BENEFIBER) POWD Take by mouth.   No  current facility-administered medications on file prior to visit.     Review of Systems  Constitutional: Negative for activity change, appetite change, chills, diaphoresis, fatigue and fever.  HENT: Negative for congestion and hearing loss.   Eyes: Negative for visual disturbance.  Respiratory: Negative for cough, chest tightness, shortness of breath and wheezing.   Cardiovascular: Negative for chest pain, palpitations and leg swelling.  Gastrointestinal: Negative for abdominal pain, anal bleeding, blood in stool, constipation, diarrhea, nausea and vomiting.  Endocrine: Negative for cold intolerance and polyuria.  Genitourinary: Negative for decreased urine volume, difficulty urinating, dysuria, frequency, hematuria and urgency.  Musculoskeletal: Negative for arthralgias, back pain, gait problem and neck pain.  Skin: Negative for rash.  Allergic/Immunologic: Negative for environmental allergies.  Neurological: Negative for dizziness, weakness, light-headedness, numbness and headaches.  Hematological: Negative for adenopathy.  Psychiatric/Behavioral: Positive for dysphoric mood and sleep disturbance. Negative for agitation, behavioral problems, decreased concentration, self-injury and suicidal ideas. The patient is nervous/anxious.    Per HPI unless specifically indicated above     Objective:    BP 114/64   Pulse 72   Temp 98.3 F (36.8 C) (Oral)   Resp 16   Ht _0  (1.499 m)   Wt 120 lb 12.8 oz (54.8 kg)   BMI 24.40 kg/m   Wt Readings from Last 3 Encounters:  08/25/16 120 lb 12.8 oz (54.8 kg)  07/07/16 130 lb (59 kg)  05/25/16 131 lb (59.4 kg)    Physical Exam  Constitutional: She is oriented to person, place, and time. She appears well-developed and well-nourished. No distress.  Well-appearing, comfortable, cooperative  HENT:  Head: Normocephalic and atraumatic.  Mouth/Throat: Oropharynx is clear and moist.  Eyes: Pupils are equal, round, and reactive to light.  Conjunctivae and EOM are normal. Right eye exhibits no discharge. Left eye exhibits no discharge.  Neck: Normal range of motion. Neck supple. No thyromegaly present.  Cardiovascular: Normal rate, regular rhythm, normal heart sounds and intact distal pulses.   No murmur heard. Pulmonary/Chest: Effort normal and breath sounds normal. No respiratory distress. She has no wheezes. She has no rales.  Abdominal: Soft. Bowel sounds are normal. She exhibits no distension and no mass. There is no tenderness.  Musculoskeletal: Normal range of motion. She exhibits no edema or tenderness.  Upper / Lower Extremities: - Normal muscle tone, strength bilateral upper extremities 5/5, lower extremities 5/5  Lymphadenopathy:    She has no cervical adenopathy.  Neurological: She is alert and oriented to person, place, and time.  Distal sensation intact to light touch all extremities  Skin: Skin is warm and dry. No rash noted. She is not diaphoretic. No erythema.  Psychiatric: She has a normal mood and affect. Her behavior is normal.  Well groomed, good eye contact, normal speech and thoughts. Except brief instance of tearful episode and sad / depressed mood when discussing having to put down her ill dog and also serving as caregiver for her husband, who has been in hospital recently  Nursing note and vitals reviewed.   Diabetic Foot Exam - Simple   Simple Foot Form Diabetic Foot exam was performed with the following findings:  Yes 08/25/2016 11:33 AM  Visual Inspection No deformities, no ulcerations, no other skin breakdown bilaterally:  Yes Sensation Testing Intact to touch and monofilament testing bilaterally:  Yes Pulse Check Posterior Tibialis and Dorsalis pulse intact bilaterally:  Yes Comments     Results for orders placed or performed in visit on 70/01/74  BASIC METABOLIC PANEL WITH GFR  Result Value Ref Range   Sodium 142 135 - 146 mmol/L   Potassium 4.4 3.5 - 5.3 mmol/L   Chloride 104 98 - 110  mmol/L   CO2 21 20 - 31 mmol/L   Glucose, Bld 102 (H) 65 - 99 mg/dL   BUN 21 7 - 25 mg/dL   Creat 1.43 (H) 0.60 - 0.88 mg/dL   Calcium 9.6 8.6 - 10.4 mg/dL   GFR, Est African American 39 (L) >=60 mL/min   GFR, Est Non African American 34 (L) >=60 mL/min  CBC with Differential/Platelet  Result Value Ref Range   WBC 6.3 3.8 - 10.8 K/uL   RBC  3.73 (L) 3.80 - 5.10 MIL/uL   Hemoglobin 11.2 (L) 11.7 - 15.5 g/dL   HCT 34.8 (L) 35.0 - 45.0 %   MCV 93.3 80.0 - 100.0 fL   MCH 30.0 27.0 - 33.0 pg   MCHC 32.2 32.0 - 36.0 g/dL   RDW 13.9 11.0 - 15.0 %   Platelets 344 140 - 400 K/uL   MPV 8.8 7.5 - 12.5 fL   Neutro Abs 3,402 1,500 - 7,800 cells/uL   Lymphs Abs 1,953 850 - 3,900 cells/uL   Monocytes Absolute 630 200 - 950 cells/uL   Eosinophils Absolute 252 15 - 500 cells/uL   Basophils Absolute 63 0 - 200 cells/uL   Neutrophils Relative % 54 %   Lymphocytes Relative 31 %   Monocytes Relative 10 %   Eosinophils Relative 4 %   Basophils Relative 1 %   Smear Review Criteria for review not met   Lipid panel  Result Value Ref Range   Cholesterol 196 <200 mg/dL   Triglycerides 128 <150 mg/dL   HDL 62 >50 mg/dL   Total CHOL/HDL Ratio 3.2 <5.0 Ratio   VLDL 26 <30 mg/dL   LDL Cholesterol 108 (H) <100 mg/dL  TSH  Result Value Ref Range   TSH 2.91 mIU/L  Hemoglobin A1c  Result Value Ref Range   Hgb A1c MFr Bld 6.8 (H) <5.7 %   Mean Plasma Glucose 148 mg/dL      Assessment & Plan:   Problem List Items Addressed This Visit    Hypertension    Well-controlled HTN even off all meds including Lisinopril - Home BP readings normal, concern even mild low while on newly low dose added lisinopril 5 instead of 20 Complication with CKD III followed by Nephrology [facility] Dr Candiss Norse   Plan:  1. Reviewed BP logs- remain off all anti-HTN therapy for now monitor BP - discuss ACEi with Nephrology 2. Encourage improved lifestyle - low sodium diet, continue walking regular exercise 3. Continue monitor BP  outside office, bring readings to next visit, if persistently >140/90 or new symptoms notify office sooner 4. Follow-up q 3-6 months      Hyperlipidemia associated with type 2 diabetes mellitus ([place])    Mostly controlled cholesterol on statin and improved lifestyle Last lipid panel 07/2016  Plan: 1. Continue current meds - Simvastatin 3m 2. Continue ASA 860mfor primary ASCVD risk reduction 3. Encourage improved lifestyle - low carb/cholesterol, reduce portion size, continue improving regular exercise      Diabetes type 2, controlled ([facility]) - Primary    Dramatic improved control A1c 6.8 from 8.1, secondary to lifestyle diet and exercise Complications - nephropathy with CKD III-IV, also in setting HTN - Contraindicated - Metformin / SGLT2. H/o failed Glyburide, Januvia. Not interested in injectable  Plan:  1. Detailed CBG log reviewed, seemed best control with Glipizide, agree to reduce dose 81m49mALF tab BID only instead of half/half/whole dosing - Remain off Januvia 2. Future may consider discussing low dose Metformin with Nephrology if needed 3. Cannot take ASA due to CKD, continue statin, was on ACEi 4. Encouraged lifestyle improvements with DM diet, continue walking 5. Follow-up 3 months for DM A1c      CKD stage 3 due to type 2 diabetes mellitus ([facility] [place]  Stable to gradual improvement CKD-III, secondary to HTN DM age Followed by Nephrology [facility]  Plan: 1. DC'd Lisinopril 81mg38mily - patient request since still had some low bps and occasional dizzy symptom even on  med, well controlled BP with close monitoring off all anti-HTN meds 2. Recommend that she ask Nephrology about if needs any dose ACEi in future      Anxiety associated with depression    Suspected new dx anxiety with depression vs adjustment disorder, now with gradual worsening causing more difficulty functioning, previously coped well, now affecting physically primary stressor caring for ill husband -GAD7: 37 /  PHQ9: 54 - Failed: Fluoxetine (side effects, years ago) - No prior dx / Psych / counseling  Plan: 1. Discussion on new diagnosis anxiety/depression/adjustment, management, complications 2. Start Escitalopram 5m daily AM with food, counseling on potential side effects risks, anticipate 4-6 weeks for notable effect, may need titrate dose to 10-20 in future 3. Advised recommend therapy / counseling in future 4. Follow-up 4-6 weeks anxiety/dep, med adjust, GAD7/PHQ9      Relevant Medications   escitalopram (LEXAPRO) 5 MG tablet    Other Visit Diagnoses    Postmenopausal estrogen deficiency       Relevant Orders   DG Bone Density   Breast pain, left       left sided pain, new onset but has had similar, orders placed   Relevant Orders   MM DIAG BREAST TOMO BILATERAL   [place] LTD UNI LEFT INC AXILLA   Need for pneumococcal vaccine       Relevant Orders   Pneumococcal polysaccharide vaccine 23-valent greater than or equal to 2yo subcutaneous/IM (Completed)      Meds ordered this encounter  Medications  . escitalopram (LEXAPRO) 5 MG tablet    Sig: Take 1 tablet (5 mg total) by mouth daily. With food    Dispense:  30 tablet    Refill:  2    Follow up plan: Return in about 6 weeks (around 10/06/2016) for Anxiety/Mood PHQ/GAD.  ANobie Putnam [facility] 08/26/2016, 12:02 AM

## 2016-08-25 NOTE — Assessment & Plan Note (Signed)
Stable to gradual improvement CKD-III, secondary to HTN DM age Followed by Nephrology [facility]  Plan: 1. DC'd Lisinopril 5mg  daily - patient request since still had some low bps and occasional dizzy symptom even on med, well controlled BP with close monitoring off all anti-HTN meds 2. Recommend that she ask Nephrology about if needs any dose ACEi in future

## 2016-08-25 NOTE — Assessment & Plan Note (Signed)
Dramatic improved control A1c 6.8 from 8.1, secondary to lifestyle diet and exercise Complications - nephropathy with CKD III-IV, also in setting HTN - Contraindicated - Metformin / SGLT2. H/o failed Glyburide, Januvia. Not interested in injectable  Plan:  1. Detailed CBG log reviewed, seemed best control with Glipizide, agree to reduce dose 5mg  HALF tab BID only instead of half/half/whole dosing - Remain off Januvia 2. Future may consider discussing low dose Metformin with Nephrology if needed 3. Cannot take ASA due to CKD, continue statin, was on ACEi 4. Encouraged lifestyle improvements with DM diet, continue walking 5. Follow-up 3 months for DM A1c

## 2016-08-25 NOTE — Patient Instructions (Addendum)
Thank you for coming to the clinic today.  1. Diabetes, good work overall, A1c 6.8 (previously 8.1) - STOP januvia - REDUCE Glipzide from half AM / half lunch / whole PM dinner - NOW only taking it HALF in AM and HALF in PM (breakfast and dinner only) - Keep improving diet - Re-check A1c 3 months  2. Try to keep working on regular walking exercise, I do not think this was elevating potassium significantly to cause problem. I am pleased with results  3. Stay OFF of lisinopril, discontinue this med, BP is well controlled  4. For Diagnostic Mammogram and Ultrasound (LEFT side) and DEXA Scan (Bone mineral density) screening for osteoporosis  Call the [place] below anytime to schedule your own appointment now that order has been placed.  [facility] [place], [place] [address] Phone: [phone]  As discussed, it sounds like your symptoms are primarily related to anxiety / adjustment disorder. This is a very common problem and be related to several factors, including life stressors. Start treatment with Escitalopram 5mg , take once daily with food. As discussed most anxiety medications are also used for mood disorders such as depression, because they work on similar chemicals in your brain. It may take up to 3-4 weeks for the medicine to take full effect and for you to notice a difference, sometimes you may notice it working sooner, otherwise we may need to adjust the dose.   Please schedule a Follow-up Appointment to: Return in about 6 weeks (around 10/06/2016) for Anxiety/Mood PHQ/GAD.  If you have any other questions or concerns, please feel free to call the clinic or send a message through [place]. You may also schedule an earlier appointment if necessary.  Additionally, you may be receiving a survey about your experience at our clinic within a few days to 1 week by e-mail or mail. We value your  feedback.  Nobie Putnam, DO [place]

## 2016-08-26 NOTE — Assessment & Plan Note (Signed)
Suspected new dx anxiety with depression vs adjustment disorder, now with gradual worsening causing more difficulty functioning, previously coped well, now affecting physically primary stressor caring for ill husband -GAD7: 25 / PHQ9: 13 - Failed: Fluoxetine (side effects, years ago) - No prior dx / Psych / counseling  Plan: 1. Discussion on new diagnosis anxiety/depression/adjustment, management, complications 2. Start Escitalopram 5mg  daily AM with food, counseling on potential side effects risks, anticipate 4-6 weeks for notable effect, may need titrate dose to 10-20 in future 3. Advised recommend therapy / counseling in future 4. Follow-up 4-6 weeks anxiety/dep, med adjust, GAD7/PHQ9

## 2016-08-26 NOTE — Assessment & Plan Note (Signed)
Mostly controlled cholesterol on statin and improved lifestyle Last lipid panel 07/2016  Plan: 1. Continue current meds - Simvastatin 40mg  2. Continue ASA 81mg  for primary ASCVD risk reduction 3. Encourage improved lifestyle - low carb/cholesterol, reduce portion size, continue improving regular exercise

## 2016-08-28 DIAGNOSIS — I10 Essential (primary) hypertension: Secondary | ICD-10-CM | POA: Diagnosis not present

## 2016-08-28 DIAGNOSIS — E875 Hyperkalemia: Secondary | ICD-10-CM | POA: Diagnosis not present

## 2016-08-28 DIAGNOSIS — N183 Chronic kidney disease, stage 3 (moderate): Secondary | ICD-10-CM | POA: Diagnosis not present

## 2016-08-28 DIAGNOSIS — E1122 Type 2 diabetes mellitus with diabetic chronic kidney disease: Secondary | ICD-10-CM | POA: Diagnosis not present

## 2016-09-02 ENCOUNTER — Encounter: Payer: Self-pay | Admitting: Family Medicine

## 2016-09-02 ENCOUNTER — Ambulatory Visit
Admission: RE | Admit: 2016-09-02 | Discharge: 2016-09-02 | Disposition: A | Discharge status: Home / Self Care | Payer: PPO | Source: Ambulatory Visit | Attending: Family Medicine | Admitting: Family Medicine

## 2016-09-02 ENCOUNTER — Encounter (HOSPITAL_COMMUNITY): Payer: Self-pay

## 2016-09-02 DIAGNOSIS — Z780 Asymptomatic menopausal state: Secondary | ICD-10-CM | POA: Insufficient documentation

## 2016-09-02 DIAGNOSIS — N644 Mastodynia: Secondary | ICD-10-CM | POA: Insufficient documentation

## 2016-09-02 DIAGNOSIS — M85832 Other specified disorders of bone density and structure, left forearm: Secondary | ICD-10-CM | POA: Diagnosis not present

## 2016-09-02 DIAGNOSIS — M8588 Other specified disorders of bone density and structure, other site: Secondary | ICD-10-CM | POA: Diagnosis not present

## 2016-09-02 DIAGNOSIS — R922 Inconclusive mammogram: Secondary | ICD-10-CM | POA: Diagnosis not present

## 2016-09-02 DIAGNOSIS — N6489 Other specified disorders of breast: Secondary | ICD-10-CM | POA: Diagnosis not present

## 2016-09-02 DIAGNOSIS — M8580 Other specified disorders of bone density and structure, unspecified site: Secondary | ICD-10-CM | POA: Insufficient documentation

## 2016-09-04 ENCOUNTER — Encounter: Payer: Self-pay | Admitting: Family Medicine

## 2016-09-04 ENCOUNTER — Ambulatory Visit (INDEPENDENT_AMBULATORY_CARE_PROVIDER_SITE_OTHER): Payer: PPO | Admitting: Family Medicine

## 2016-09-04 VITALS — BP 118/62 | HR 64 | Temp 98.4°F | Resp 16 | Ht 59.0 in | Wt 121.0 lb

## 2016-09-04 DIAGNOSIS — B029 Zoster without complications: Secondary | ICD-10-CM

## 2016-09-04 MED ORDER — VALACYCLOVIR HCL 1 G PO TABS: 1000.0000 mg | ORAL_TABLET | Freq: Two times a day (BID) | ORAL | 0 refills | Status: DC

## 2016-09-04 NOTE — Progress Notes (Signed)
Subjective:    Patient ID: Lisa Mills, female    DOB: 1929-09-28, 83 y.o.   MRN: [id number]  Lisa Mills is a 83 y.o. female presenting on 09/04/2016 for Herpes Zoster (onset last week left lower back as per pt started could be month ago but noticed last week)   HPI   Shingles: - Reports first noticed rash 1 week ago, uncertain exact onset, had some symptoms of skin irritation and itching, with some localized burning. Similar to previous shingles episode. Now seems mostly resolved, rash is improved, symptoms are lingering but less severe. Not taking any medicine for it. - S/p Zostavax previously, interested in Shingrix vaccine, will check with pharmacy - Denies any fever/chills, other rash, eye or mucosal involvement, worsening pain  Social History  Substance Use Topics  . Smoking status: Never Smoker  . Smokeless tobacco: Never Used  . Alcohol use Yes    Review of Systems Per HPI unless specifically indicated above     Objective:    BP 118/62   Pulse 64   Temp 98.4 F (36.9 C) (Oral)   Resp 16   Ht _0  (1.499 m)   Wt 121 lb (54.9 kg)   BMI 24.44 kg/m   Wt Readings from Last 3 Encounters:  09/04/16 121 lb (54.9 kg)  08/25/16 120 lb 12.8 oz (54.8 kg)  07/07/16 130 lb (59 kg)    Physical Exam  Constitutional: She is oriented to person, place, and time. She appears well-developed and well-nourished. No distress.  Well-appearing, comfortable, cooperative  HENT:  Head: Normocephalic and atraumatic.  Mouth/Throat: Oropharynx is clear and moist.  Eyes: Conjunctivae are normal. Right eye exhibits no discharge. Left eye exhibits no discharge.  Cardiovascular: Normal rate.   Pulmonary/Chest: Effort normal.  Musculoskeletal: She exhibits no edema.  Neurological: She is alert and oriented to person, place, and time.  Skin: Skin is warm and dry. Rash (Resolving left mid/lower back dermatomal maculopapular rash) noted. She is not diaphoretic. No erythema.    Psychiatric: She has a normal mood and affect. Her behavior is normal.  Well groomed, good eye contact, normal speech and thoughts  Nursing note and vitals reviewed.  Results for orders placed or performed in visit on 09/47/09  BASIC METABOLIC PANEL WITH GFR  Result Value Ref Range   Sodium 142 135 - 146 mmol/L   Potassium 4.4 3.5 - 5.3 mmol/L   Chloride 104 98 - 110 mmol/L   CO2 21 20 - 31 mmol/L   Glucose, Bld 102 (H) 65 - 99 mg/dL   BUN 21 7 - 25 mg/dL   Creat 1.43 (H) 0.60 - 0.88 mg/dL   Calcium 9.6 8.6 - 10.4 mg/dL   GFR, Est African American 39 (L) >=60 mL/min   GFR, Est Non African American 34 (L) >=60 mL/min  CBC with Differential/Platelet  Result Value Ref Range   WBC 6.3 3.8 - 10.8 K/uL   RBC 3.73 (L) 3.80 - 5.10 MIL/uL   Hemoglobin 11.2 (L) 11.7 - 15.5 g/dL   HCT 34.8 (L) 35.0 - 45.0 %   MCV 93.3 80.0 - 100.0 fL   MCH 30.0 27.0 - 33.0 pg   MCHC 32.2 32.0 - 36.0 g/dL   RDW 13.9 11.0 - 15.0 %   Platelets 344 140 - 400 K/uL   MPV 8.8 7.5 - 12.5 fL   Neutro Abs 3,402 1,500 - 7,800 cells/uL   Lymphs Abs 1,953 850 - 3,900 cells/uL   Monocytes Absolute  630 200 - 950 cells/uL   Eosinophils Absolute 252 15 - 500 cells/uL   Basophils Absolute 63 0 - 200 cells/uL   Neutrophils Relative % 54 %   Lymphocytes Relative 31 %   Monocytes Relative 10 %   Eosinophils Relative 4 %   Basophils Relative 1 %   Smear Review Criteria for review not met   Lipid panel  Result Value Ref Range   Cholesterol 196 <200 mg/dL   Triglycerides 128 <150 mg/dL   HDL 62 >50 mg/dL   Total CHOL/HDL Ratio 3.2 <5.0 Ratio   VLDL 26 <30 mg/dL   LDL Cholesterol 108 (H) <100 mg/dL  TSH  Result Value Ref Range   TSH 2.91 mIU/L  Hemoglobin A1c  Result Value Ref Range   Hgb A1c MFr Bld 6.8 (H) <5.7 %   Mean Plasma Glucose 148 mg/dL      Assessment & Plan:   Problem List Items Addressed This Visit    None    Visit Diagnoses    Herpes zoster without complication    -  Primary  Clinically  consistent with new subacute resolving shingles herpes zoster outbreak onset >1 week ago, with mild neuropathic symptoms - S/p Zostavax, prior shingles  - No evidence of complications, such as ocular involvement  Plan: 1. Start Valacyclovir 1093m BID with food x 5 days, counseled on treatment course and side effects 2. Discussed additional neuropathic med options however mutual decision hold off on these for now due to mild symptoms and well tolerated 3. Follow-up as needed if not resolving or if residual pain, strict return criteria if ocular involvement or other acute concerns     Relevant Medications   valACYclovir (VALTREX) 1000 MG tablet      Meds ordered this encounter  Medications  . valACYclovir (VALTREX) 1000 MG tablet    Sig: Take 1 tablet (1,000 mg total) by mouth 2 (two) times daily. For 5 days    Dispense:  10 tablet    Refill:  0      Follow up plan: Return in about 1 week (around 09/11/2016), or if symptoms worsen or fail to improve, for Shingles.  ANobie Putnam [facility] 09/06/2016, 7:57 PM

## 2016-09-04 NOTE — Patient Instructions (Addendum)
Thank you for coming to the clinic today.  1. Rash does look like shingles, it is mostly resolved  Go ahead and start anti viral medicine Valtrex 1000mg  twice daily for 5 days to make sure it resolves  Next we can consider Shingrix new shingles vaccine in 1 month when I see you back, if you want to check with pharmacy first to find out the cost and coverage, and if you need a rx or not that would help  Please schedule a Follow-up Appointment to: Return in about 1 week (around 09/11/2016), or if symptoms worsen or fail to improve, for Shingles.  If you have any other questions or concerns, please feel free to call the clinic or send a message through [place]. You may also schedule an earlier appointment if necessary.  Additionally, you may be receiving a survey about your experience at our clinic within a few days to 1 week by e-mail or mail. We value your feedback.  Nobie Putnam, DO [place]

## 2016-09-22 DIAGNOSIS — Z860100 Personal history of colon polyps, unspecified: Secondary | ICD-10-CM | POA: Insufficient documentation

## 2016-09-22 DIAGNOSIS — K5900 Constipation, unspecified: Secondary | ICD-10-CM | POA: Diagnosis not present

## 2016-09-22 DIAGNOSIS — E118 Type 2 diabetes mellitus with unspecified complications: Secondary | ICD-10-CM | POA: Diagnosis not present

## 2016-10-06 ENCOUNTER — Ambulatory Visit (INDEPENDENT_AMBULATORY_CARE_PROVIDER_SITE_OTHER): Payer: PPO | Admitting: Family Medicine

## 2016-10-06 ENCOUNTER — Encounter: Payer: Self-pay | Admitting: Family Medicine

## 2016-10-06 VITALS — BP 111/80 | HR 59 | Temp 98.4°F | Resp 16 | Ht 59.0 in | Wt 119.8 lb

## 2016-10-06 DIAGNOSIS — F418 Other specified anxiety disorders: Secondary | ICD-10-CM

## 2016-10-06 DIAGNOSIS — B0229 Other postherpetic nervous system involvement: Secondary | ICD-10-CM

## 2016-10-06 MED ORDER — ESCITALOPRAM OXALATE 5 MG PO TABS: 5.0000 mg | ORAL_TABLET | Freq: Every day | ORAL | 11 refills | Status: DC

## 2016-10-06 NOTE — Assessment & Plan Note (Signed)
Improved, now mild only intermittent L sided back / flank Has had recurrent shingles in past S/p Zostavax No further treatment needed now Due for Shingrix - will get done at pharmacy in future

## 2016-10-06 NOTE — Assessment & Plan Note (Addendum)
Significantly improved anxiety vs adjustment disorder now on SSRI Complicated by difficulty with sleep maintenance, seems improved on med still - Primary stressor caring for ill husband -GAD7: 0 (from 13) / PHQ9: 2 (from 13) - Failed: Fluoxetine (side effects, years ago) - No psych/counseling  Plan: 1. Continue / refilled Escitalopram 5mg  daily AM - no dose change 2. Follow-up as scheduled in 2 months, can repeat GAD/PHQ, otherwise continue routine monitoring likely benefit from med for >6 months or longer - future consider other med for sleep if needed, caution with hypnotics or other agents

## 2016-10-06 NOTE — Progress Notes (Signed)
Subjective:    Patient ID: Lisa Mills, female    DOB: 11/20/1929, 83 y.o.   MRN: [id number]  NIKKA HAKIMIAN is a 83 y.o. female presenting on 10/06/2016 for Anxiety (improved)   HPI   FOLLOW-UP ADJUSTMENT Disorder w/ Anxiety / Depression, mixed - Last visit with me 08/25/16, for initial visit for this problem, treated with new start SSRI Lexapro '5mg'$  daily, see prior notes for background information. - Interval update with overall has done very well on medication. She did not increase dose. - Today patient reports no new concerns, feels good and would like refills to continue current dose. Medicine is helping her cope well with husband's illness and other life stressors - Past tried Fluoxetine but did not do well o nthis - Admits now only concern is some difficulty with sleep maintenance, she admits able to fall asleep well but if wakes up overnight to void after 0230 then difficulty falling back asleep. She has improved sleep hygiene overall. Drinks plenty of water, limit from dinner on - Denies worsening mood depression, anxiety, energy, fatigue, suicidal or homicidal ideation  FOLLOW-UP SHINGLES - Last visit with me 09/04/16, for initial visit for shingles, treated with Valtrex at that time, she has had prior shingles episodes and s/p Zostavax, see prior notes for background information. - Interval update with resolved shingles flare, now has very minimal residual nerve symptoms - Today patient reports no new concerns. Describes some occasional L sided flank and back and L near breast with tingling or burning pain, now very mild, only temporary, also endorses Left foot tingling at times, worse if L hip pain and discomfort, history of arthritis, has had sciatica before but currently does not feel like sciatica - Most days no problems with tingling, or burning. - Intersted in ortho in future if needs for L hip  Health Maintenance: - Will get Flu Shot in October, will return nurse  visit - Requesting Shingrix vaccine in future, she was quoted $45 at pharmacy [facility], will get it there, due to limitations by Medicare here at our office  Depression screen [facility] 2/9 10/06/2016 08/25/2016 02/25/2016  Decreased Interest 0 1 0  Down, Depressed, Hopeless 0 2 0  PHQ - 2 Score 0 3 0  Altered sleeping 2 2 -  Tired, decreased energy 0 1 -  Change in appetite 0 2 -  Feeling bad or failure about yourself  0 1 -  Trouble concentrating 0 2 -  Moving slowly or fidgety/restless 0 2 -  Suicidal thoughts 0 0 -  PHQ-9 Score 2 13 -   GAD 7 : Generalized Anxiety Score 10/06/2016 08/25/2016  Nervous, Anxious, on Edge 0 3  Control/stop worrying 0 2  Worry too much - different things 0 2  Trouble relaxing 0 1  Restless 0 1  Easily annoyed or irritable 0 2  Afraid - awful might happen 0 2  Total GAD 7 Score 0 13  Anxiety Difficulty Not difficult at all -    Social History  Substance Use Topics  . Smoking status: Never Smoker  . Smokeless tobacco: Never Used  . Alcohol use Yes    Review of Systems Per HPI unless specifically indicated above     Objective:    BP 111/80   Pulse (!) 59   Temp 98.4 F (36.9 C) (Oral)   Resp 16   Ht '4\' 11"'$  (1.499 m)   Wt 119 lb 12.8 oz (54.3 kg)   BMI 24.20  kg/m   Wt Readings from Last 3 Encounters:  10/06/16 119 lb 12.8 oz (54.3 kg)  09/04/16 121 lb (54.9 kg)  08/25/16 120 lb 12.8 oz (54.8 kg)    Physical Exam  Constitutional: She is oriented to person, place, and time. She appears well-developed and well-nourished. No distress.  Well-appearing, comfortable, cooperative  HENT:  Head: Normocephalic and atraumatic.  Mouth/Throat: Oropharynx is clear and moist.  Eyes: Conjunctivae are normal. Right eye exhibits no discharge. Left eye exhibits no discharge.  Cardiovascular: Normal rate.   Pulmonary/Chest: Effort normal.  Musculoskeletal: She exhibits no edema.  Neurological: She is alert and oriented to person, place, and time.  Skin:  Skin is warm and dry. No rash noted. She is not diaphoretic. No erythema.  Psychiatric: She has a normal mood and affect. Her behavior is normal.  Well groomed, good eye contact, normal speech and thoughts  Nursing note and vitals reviewed.    Results for orders placed or performed in visit on 66/44/03  BASIC METABOLIC PANEL WITH GFR  Result Value Ref Range   Sodium 142 135 - 146 mmol/L   Potassium 4.4 3.5 - 5.3 mmol/L   Chloride 104 98 - 110 mmol/L   CO2 21 20 - 31 mmol/L   Glucose, Bld 102 (H) 65 - 99 mg/dL   BUN 21 7 - 25 mg/dL   Creat 1.43 (H) 0.60 - 0.88 mg/dL   Calcium 9.6 8.6 - 10.4 mg/dL   GFR, Est African American 39 (L) >=60 mL/min   GFR, Est Non African American 34 (L) >=60 mL/min  CBC with Differential/Platelet  Result Value Ref Range   WBC 6.3 3.8 - 10.8 K/uL   RBC 3.73 (L) 3.80 - 5.10 MIL/uL   Hemoglobin 11.2 (L) 11.7 - 15.5 g/dL   HCT 34.8 (L) 35.0 - 45.0 %   MCV 93.3 80.0 - 100.0 fL   MCH 30.0 27.0 - 33.0 pg   MCHC 32.2 32.0 - 36.0 g/dL   RDW 13.9 11.0 - 15.0 %   Platelets 344 140 - 400 K/uL   MPV 8.8 7.5 - 12.5 fL   Neutro Abs 3,402 1,500 - 7,800 cells/uL   Lymphs Abs 1,953 850 - 3,900 cells/uL   Monocytes Absolute 630 200 - 950 cells/uL   Eosinophils Absolute 252 15 - 500 cells/uL   Basophils Absolute 63 0 - 200 cells/uL   Neutrophils Relative % 54 %   Lymphocytes Relative 31 %   Monocytes Relative 10 %   Eosinophils Relative 4 %   Basophils Relative 1 %   Smear Review Criteria for review not met   Lipid panel  Result Value Ref Range   Cholesterol 196 <200 mg/dL   Triglycerides 128 <150 mg/dL   HDL 62 >50 mg/dL   Total CHOL/HDL Ratio 3.2 <5.0 Ratio   VLDL 26 <30 mg/dL   LDL Cholesterol 108 (H) <100 mg/dL  TSH  Result Value Ref Range   TSH 2.91 mIU/L  Hemoglobin A1c  Result Value Ref Range   Hgb A1c MFr Bld 6.8 (H) <5.7 %   Mean Plasma Glucose 148 mg/dL      Assessment & Plan:   Problem List Items Addressed This Visit    Post herpetic  neuralgia    Improved, now mild only intermittent L sided back / flank Has had recurrent shingles in past S/p Zostavax No further treatment needed now Due for Shingrix - will get done at pharmacy in future      Relevant  Medications   escitalopram (LEXAPRO) 5 MG tablet   Anxiety associated with depression - Primary    Significantly improved anxiety vs adjustment disorder now on SSRI Complicated by difficulty with sleep maintenance, seems improved on med still - Primary stressor caring for ill husband -GAD7: 0 (from 79) / PHQ9: 2 (from 13) - Failed: Fluoxetine (side effects, years ago) - No psych/counseling  Plan: 1. Continue / refilled Escitalopram '5mg'$  daily AM - no dose change 2. Follow-up as scheduled in 2 months, can repeat GAD/PHQ, otherwise continue routine monitoring likely benefit from med for >6 months or longer - future consider other med for sleep if needed, caution with hypnotics or other agents      Relevant Medications   escitalopram (LEXAPRO) 5 MG tablet      Meds ordered this encounter  Medications  . linaclotide (LINZESS) 145 MCG CAPS capsule    Sig: Take by mouth.  . escitalopram (LEXAPRO) 5 MG tablet    Sig: Take 1 tablet (5 mg total) by mouth daily. With food    Dispense:  30 tablet    Refill:  11    Add refills    Follow up plan: Return in about 2 months (around 12/06/2016) for Keep scheduled apt November DM.  Nobie Putnam, [facility] 10/06/2016, 1:14 PM

## 2016-10-06 NOTE — Patient Instructions (Addendum)
Thank you for coming to the clinic today.  1. Consider OTC Melatonin start with 1mg  nightly 30 min to 1hour before bed, in future if doing well on it may increase up to 3 to 5mg  max   Sleep Hygiene Recommendations to promote healthy sleep in all patients, especially if symptoms of insomnia are worsening. Due to the nature of sleep rhythms, if your body gets "out of rhythm", it may take some time before your sleep cycle can be "reset".  Please try to follow as many of the following tips as you can, usually there are only a few of these are the primary cause of the problem.  ?To reset your sleep rhythm, go to bed and get up at the same time every day ?Sleep only long enough to feel rested and then get out of bed ?Do not try to force yourself to sleep. If you can't sleep, get out of bed and try again later. ?Avoid naps during the day, unless excessively tired. The more sleeping during the day, then the less sleep your body needs at night.  ?Have coffee, tea, and other foods that have caffeine only in the morning ?Exercise several days a week, but not right before bed ?If you drink alcohol, prefer to have appropriate drink with one meal, but prefer to avoid alcohol in the evening, and bedtime ?If you smoke, avoid smoking, especially in the evening  ?Avoid watching TV or looking at phones, computers, or reading devices ("e-books") that give off light at least 30 minutes before bed. This artificial light sends "awake signals" to your brain and can make it harder to fall asleep. ?Make your bedroom a comfortable place where it is easy to fall asleep: ? Put up shades or special blackout curtains to block light from outside. ? Use a white noise machine to block noise. ? Keep the temperature cool. ?Try your best to solve or at least address your problems before you go to bed ?Use relaxation techniques to manage stress. Ask your health care provider to suggest some techniques that may work well for  you. These may include: ? Breathing exercises. ? Routines to release muscle tension. ? Visualizing peaceful scenes.  Please schedule a Follow-up Appointment to: Return in about 2 months (around 12/06/2016) for Keep scheduled apt November DM.  If you have any other questions or concerns, please feel free to call the clinic or send a message through [place]. You may also schedule an earlier appointment if necessary.  Additionally, you may be receiving a survey about your experience at our clinic within a few days to 1 week by e-mail or mail. We value your feedback.  Nobie Putnam, DO [place]

## 2016-10-07 DIAGNOSIS — L821 Other seborrheic keratosis: Secondary | ICD-10-CM | POA: Diagnosis not present

## 2016-10-07 DIAGNOSIS — D1800 Hemangioma unspecified site: Secondary | ICD-10-CM | POA: Diagnosis not present

## 2016-10-07 DIAGNOSIS — L820 Inflamed seborrheic keratosis: Secondary | ICD-10-CM | POA: Diagnosis not present

## 2016-10-07 DIAGNOSIS — B351 Tinea unguium: Secondary | ICD-10-CM | POA: Diagnosis not present

## 2016-10-07 DIAGNOSIS — L812 Freckles: Secondary | ICD-10-CM | POA: Diagnosis not present

## 2016-10-07 DIAGNOSIS — L570 Actinic keratosis: Secondary | ICD-10-CM | POA: Diagnosis not present

## 2016-10-07 DIAGNOSIS — Z85828 Personal history of other malignant neoplasm of skin: Secondary | ICD-10-CM | POA: Diagnosis not present

## 2016-10-07 DIAGNOSIS — L578 Other skin changes due to chronic exposure to nonionizing radiation: Secondary | ICD-10-CM | POA: Diagnosis not present

## 2016-10-09 ENCOUNTER — Other Ambulatory Visit: Payer: Self-pay | Admitting: Nurse Practitioner

## 2016-10-09 DIAGNOSIS — K219 Gastro-esophageal reflux disease without esophagitis: Secondary | ICD-10-CM

## 2016-10-23 ENCOUNTER — Ambulatory Visit: Payer: PPO

## 2016-10-23 ENCOUNTER — Other Ambulatory Visit: Payer: PPO

## 2016-10-28 ENCOUNTER — Ambulatory Visit (INDEPENDENT_AMBULATORY_CARE_PROVIDER_SITE_OTHER): Payer: PPO

## 2016-10-28 DIAGNOSIS — Z23 Encounter for immunization: Secondary | ICD-10-CM | POA: Diagnosis not present

## 2016-11-03 ENCOUNTER — Encounter: Payer: Self-pay | Admitting: Dietician

## 2016-11-03 ENCOUNTER — Encounter: Payer: PPO | Attending: Nurse Practitioner | Admitting: Dietician

## 2016-11-03 VITALS — Ht 59.0 in | Wt 121.5 lb

## 2016-11-03 DIAGNOSIS — E1122 Type 2 diabetes mellitus with diabetic chronic kidney disease: Secondary | ICD-10-CM | POA: Diagnosis not present

## 2016-11-03 DIAGNOSIS — I129 Hypertensive chronic kidney disease with stage 1 through stage 4 chronic kidney disease, or unspecified chronic kidney disease: Secondary | ICD-10-CM | POA: Insufficient documentation

## 2016-11-03 DIAGNOSIS — N189 Chronic kidney disease, unspecified: Secondary | ICD-10-CM | POA: Insufficient documentation

## 2016-11-03 DIAGNOSIS — E785 Hyperlipidemia, unspecified: Secondary | ICD-10-CM | POA: Diagnosis not present

## 2016-11-03 DIAGNOSIS — K5900 Constipation, unspecified: Secondary | ICD-10-CM | POA: Insufficient documentation

## 2016-11-03 DIAGNOSIS — R634 Abnormal weight loss: Secondary | ICD-10-CM | POA: Insufficient documentation

## 2016-11-03 DIAGNOSIS — N183 Chronic kidney disease, stage 3 (moderate): Secondary | ICD-10-CM

## 2016-11-03 NOTE — Progress Notes (Signed)
Medical Nutrition Therapy: Visit start time:10:30am End Time -11:45am  Assessment:  Diagnosis: Type 2 Diabetes, CKD, weight loss Past medical history: hyperlipidemia, hypertension, chronic constipation. Psychosocial issues/ stress concerns:Patient rates her stress as high and indicates "ok" as to how well she is dealing with her stress. She is the caregiver for her husband.  Preferred learning method:  . Auditory . Visual Current weight: 121.5 lbs Height: 59 in Medications, supplements: see list Progress and evaluation:  Patient in for initial medical nutrition therapy assessment. She reports a history of Type 2 Diabetes for 18 years. Her HgA1c had increased 5 months ago to 8.1 and her most recent 8/'18 was 6.8. Her most recent FBG's less than 110.  Patient reports she has experienced stomach pain since 1/'18 and medical tests thus far has not determined a cause. She reports a history of chronic constipation. She reports she was told several years ago that she has kidney disease but has not fully understood how to incorporate the diet guidelines into practice. She states that all of these factors have contributed to her restricting her diet and weight loss. Her goal presently is weight maintenance.  Her present diet is possibly low in protein. She restricts fruit due to sugar content and overall intake of fruits/vegetables is low (afraid to eat many of these due to potassium content). Her overall calcium intake is low.  Physical activity: When weather permits, she walks for 30 minutes.  Dietary Intake:  Usual eating pattern includes 3 meals and 1-2 snacks per day. Dining out frequency: 0-1 meals per week.  Breakfast: English muffin or biscuit, [place] sausage and/or egg or grits/ 1/2 biscuit, [place] sausage,  coffee Lunch:Ham or [place] and cheese sandwich, cookie or piece of candy or chicken salad or tuna salad sandwich Snack:- graham crackers and almond butter Supper: chicken noodle soup  (drains off broth and adds water), 5 saltines or chicken and pasta or lean pork and rice Snack: 3-4 graham crackers Beverages: at least 8 cups of water daily, coffee, decaff tea  Nutrition Care Education: Diabetes:  Instructed on a meal plan based on 1200-1300 calories for weight maintenance including carbohydrate counting and how to better balance carbohydrate, protein and fat. Encouraged small amounts of healthy fats at each meal.  Chronic Kidney Disease: Acknowledged how overwhelming numerous diet restrictions can be and commended on her efforts to read food labels and research diets for kidney disease. Gave food group listing of low potassium, low sodium and low phosphorus foods to include and wrote out sample menus based on these foods and foods available. Menus also kept carbohydrate in recommended ranges.Also gave list of foods high in potassium, sodium and phosphorus to limit or avoid. Gave dietary guidelines for potassium, phosphorus and sodium and discussed practical ways along with suggested food products to help meet these guidelines.  Nutritional Diagnosis:  NB-1.1 Food and nutrition-related knowledge deficit As related to how to incorporate dietary recommendations for phosphorus and potassium into meal plan for diabetes.  As evidenced by diet history..  Intervention: Balance meals with 1-3 oz protein (refer to list), 1-2 starch servings and 1 serving of fruits. Spread 9-10 servings of carbohydrate over meals and 1-2 snacks. Include at least 6 oz protein daily. Refer to list. Limit high potassium serving to once daily. Limit high phosphorus servings to 1-2 daily. Can have a milk product in addition to 1 high phosphorus. Sodium- less than 1500 mg daily. Potassium- less than 2300 daily (refer to list). Phosphorus- less than 800 mg  daily. Try low sodium tuna - [employer]  Try some of the Ms. DASH seasonings- lemon pepper, garlic herb, the packet of taco seasoning. Make homemade soups  with chicken broth saved in freezer. Add meat, pasta or rice and vegetables.  Education Materials given:  . Plate Planner . Food lists/ Planning A Balanced Meal . Healthy Eating with Kidney disease . Sample meal pattern/ menus . Goals/ instructions Learner/ who was taught:  . Patient  Level of understanding: [place] Kitchen Verbalized understanding Demonstrated degree of understanding via:   Teach back Learning barriers: . None Willingness to learn/ readiness for change: . Eager, change in progress  Monitoring and Evaluation:  Patient did not schedule a follow-up at this time. She stated, "This has been very helpful. I think I can manage with this information." Gave her my phone number and encouraged her to call if has further questions or decides to schedule a follow-up appointment.

## 2016-11-03 NOTE — Patient Instructions (Signed)
Balance meals with 1-3 oz protein (refer to list), 1-2 starch servings and 1 serving of fruits. Spread 9-10 servings of carbohydrate over meals and 1-2 snacks. Include at least 6 oz protein daily. Refer to list. Limit high potassium serving to once daily. Limit high phosphorus servings to 1-2 daily. Can have a milk product in addition to 1 high phosphorus. Sodium- less than 1500 mg daily. Potassium- less than 2300 daily (refer to list). Phosphorus- less than 800 mg daily. Try low sodium tuna - [employer]  Try some of the Ms. DASH seasonings- lemon pepper, garlic herb, the packet of taco seasoning.

## 2016-11-17 DIAGNOSIS — K5900 Constipation, unspecified: Secondary | ICD-10-CM | POA: Diagnosis not present

## 2016-11-17 DIAGNOSIS — K449 Diaphragmatic hernia without obstruction or gangrene: Secondary | ICD-10-CM | POA: Diagnosis not present

## 2016-11-17 DIAGNOSIS — K219 Gastro-esophageal reflux disease without esophagitis: Secondary | ICD-10-CM | POA: Diagnosis not present

## 2016-11-17 DIAGNOSIS — N183 Chronic kidney disease, stage 3 (moderate): Secondary | ICD-10-CM | POA: Diagnosis not present

## 2016-11-17 DIAGNOSIS — Z86010 Personal history of colonic polyps: Secondary | ICD-10-CM | POA: Diagnosis not present

## 2016-11-17 DIAGNOSIS — R1032 Left lower quadrant pain: Secondary | ICD-10-CM | POA: Diagnosis not present

## 2016-11-18 ENCOUNTER — Other Ambulatory Visit: Payer: Self-pay | Admitting: Family Medicine

## 2016-11-18 DIAGNOSIS — F418 Other specified anxiety disorders: Secondary | ICD-10-CM

## 2016-11-18 MED ORDER — ESCITALOPRAM OXALATE 5 MG PO TABS: 5.0000 mg | ORAL_TABLET | Freq: Every day | ORAL | 3 refills | Status: DC

## 2016-11-30 DIAGNOSIS — K5900 Constipation, unspecified: Secondary | ICD-10-CM | POA: Diagnosis not present

## 2016-12-01 ENCOUNTER — Ambulatory Visit: Payer: PPO | Admitting: Family Medicine

## 2016-12-01 ENCOUNTER — Encounter: Payer: Self-pay | Admitting: Family Medicine

## 2016-12-01 VITALS — BP 128/82 | HR 60 | Temp 98.1°F | Resp 16 | Ht 59.0 in | Wt 117.0 lb

## 2016-12-01 DIAGNOSIS — F418 Other specified anxiety disorders: Secondary | ICD-10-CM | POA: Diagnosis not present

## 2016-12-01 DIAGNOSIS — E785 Hyperlipidemia, unspecified: Secondary | ICD-10-CM

## 2016-12-01 DIAGNOSIS — N183 Chronic kidney disease, stage 3 (moderate): Secondary | ICD-10-CM | POA: Diagnosis not present

## 2016-12-01 DIAGNOSIS — E1122 Type 2 diabetes mellitus with diabetic chronic kidney disease: Secondary | ICD-10-CM

## 2016-12-01 DIAGNOSIS — E1169 Type 2 diabetes mellitus with other specified complication: Secondary | ICD-10-CM | POA: Diagnosis not present

## 2016-12-01 DIAGNOSIS — I10 Essential (primary) hypertension: Secondary | ICD-10-CM | POA: Diagnosis not present

## 2016-12-01 LAB — POCT UA - MICROALBUMIN: Microalbumin Ur, POC: 0 mg/L

## 2016-12-01 LAB — POCT GLYCOSYLATED HEMOGLOBIN (HGB A1C): Hemoglobin A1C: 6.8 — AB (ref ?–5.7)

## 2016-12-01 NOTE — Patient Instructions (Addendum)
Thank you for coming to the clinic today.  1. A1c 6.8, same as last time, keep up the great work 2. REDUCE Glipizide once more - now only take HALF tab in evening with supper, skip morning dose - Keep track of sugar - Goal to avoid low hypoglycemia CBG 60-70s  Great job with blood pressure!  WIll check urine today for protein, due to kidney function off lisinopril  Please schedule a Follow-up Appointment to: Return in about 3 months (around 03/03/2017) for DM A1c, HTN.  If you have any other questions or concerns, please feel free to call the clinic or send a message through [place]. You may also schedule an earlier appointment if necessary.  Additionally, you may be receiving a survey about your experience at our clinic within a few days to 1 week by e-mail or mail. We value your feedback.  Nobie Putnam, DO [place]

## 2016-12-01 NOTE — Progress Notes (Signed)
Subjective:    Patient ID: Lisa Mills, female    DOB: 07-May-1929, 83 y.o.   MRN: [id number]  Lisa Mills is a 83 y.o. female presenting on 12/01/2016 for Diabetes (lowest 73 and highest 168)   HPI  CHRONIC DM, Type 2: Reports no concerns, previously she had A1c up to 8.1 in 05/2016, last reading 6.8 CBGs: Avg 90-110, Low 68 (symptomatic), High < 225. Checks CBGs 2x daily Meds: Glipizide 2.5mg  (half tab) BID (reduced from previous visit half - half - whole Reports good compliance. Tolerating well w/o side-effects Currently no longer on ACEi/ARB - due for Urine Microalbumin (now off ACEi) Lifestyle: - Diet (reduced portion size, improved DM diet))  - Exercise (walking occasionally, not regularly, not if weather is bad) Denies hypoglycemia, polyuria, visual changes, numbness or tingling.  CHRONIC HTN: Reports reviewed home BP detailed log, highest SBP 130, avg 110-120 Current Meds - None - (off of Lisinopril)   Denies CP, dyspnea, HA, edema, dizziness / lightheadedness  HYPERLIPIDEMIA: - Reports concern now trial off Simvastatin for 2 weeks due to concern potential side effect constipation abdominal cramping, seems her symptoms resolved, she has not returned to GI to update them, but resumed simvastatin with dinner now and no symptoms again, will keep taking it. Last lipid panel 08/2016,  - Currently taking Simvastatin 40mg , tolerating well without side effects or myalgias  FOLLOW-UP ADJUSTMENT Disorder w/ Anxiety / Depression, mixed - Last visit with me 08/25/16, for initial visit for this problem, treated with new start SSRI Lexapro 5mg  daily, see prior notes for background information. Has been seen in follow-up for other issues. - Interval update with overall has done very well on medication. She did not increase dose. - Today patient reports no new concerns, feels good still, see PHQ/GAD scores below, would like to continue current dose, helps her cope with anxiety/stress of  her husbands chronic illness - Prior failed Fluoxetine - Improved sleep now - Denies worsening mood depression, anxiety, energy, fatigue, suicidal or homicidal ideation  FOLLOW-UP GI - Constipation, stomach pain improved off statin - Off Linzess  Health Maintenance: - UTD Flu Vaccine, Pneumonia Vaccine  Depression screen [facility] 2/9 12/02/2016 11/03/2016 10/06/2016  Decreased Interest 0 0 0  Down, Depressed, Hopeless 0 0 0  PHQ - 2 Score 0 0 0  Altered sleeping 2 - 2  Tired, decreased energy 0 - 0  Change in appetite 0 - 0  Feeling bad or failure about yourself  0 - 0  Trouble concentrating 0 - 0  Moving slowly or fidgety/restless 0 - 0  Suicidal thoughts 0 - 0  PHQ-9 Score 2 - 2  Difficult doing work/chores Not difficult at all - -   GAD 7 : Generalized Anxiety Score 12/01/2016 10/06/2016 08/25/2016  Nervous, Anxious, on Edge 0 0 3  Control/stop worrying 1 0 2  Worry too much - different things 0 0 2  Trouble relaxing 0 0 1  Restless 0 0 1  Easily annoyed or irritable 0 0 2  Afraid - awful might happen 0 0 2  Total GAD 7 Score 1 0 13  Anxiety Difficulty Not difficult at all Not difficult at all -    Social History   Tobacco Use  . Smoking status: Never Smoker  . Smokeless tobacco: Never Used  Substance Use Topics  . Alcohol use: Yes  . Drug use: No    Review of Systems Per HPI unless specifically indicated above  Objective:    BP 128/82   Pulse 60   Temp 98.1 F (36.7 C)   Resp 16   Ht 4\' 11"  (1.499 m)   Wt 117 lb (53.1 kg)   BMI 23.63 kg/m   Wt Readings from Last 3 Encounters:  12/01/16 117 lb (53.1 kg)  11/03/16 121 lb 8 oz (55.1 kg)  10/06/16 119 lb 12.8 oz (54.3 kg)    Physical Exam  Constitutional: She is oriented to person, place, and time. She appears well-developed and well-nourished. No distress.  Well-appearing, comfortable, cooperative  HENT:  Head: Normocephalic and atraumatic.  Mouth/Throat: Oropharynx is clear and moist.  Eyes:  Conjunctivae are normal. Right eye exhibits no discharge. Left eye exhibits no discharge.  Cardiovascular: Normal rate.  Pulmonary/Chest: Effort normal.  Musculoskeletal: She exhibits no edema.  Neurological: She is alert and oriented to person, place, and time.  Skin: Skin is warm and dry. No rash noted. She is not diaphoretic. No erythema.  Psychiatric: She has a normal mood and affect. Her behavior is normal.  Well groomed, good eye contact, normal speech and thoughts  Nursing note and vitals reviewed.  Results for orders placed or performed in visit on 12/01/16  POCT HgB A1C  Result Value Ref Range   Hemoglobin A1C 6.8 (A) 5.7  POCT UA - Microalbumin  Result Value Ref Range   Microalbumin Ur, POC 0 mg/L   Creatinine, POC  mg/dL   Albumin/Creatinine Ratio, Urine, POC     Recent Labs    05/25/16 1334 08/19/16 0802 12/01/16 1721  HGBA1C 8.1* 6.8* 6.8*       Assessment & Plan:   Problem List Items Addressed This Visit    Anxiety associated with depression    Remains very well controlled on SSRI Continue Lexapro 5mg  daily, improves sleep Follow-up      Controlled type 2 diabetes mellitus with diabetic nephropathy ([place]) - Primary    Remains improved/stable control A1c 6.8 (unchanged 6.8), secondary to lifestyle diet and exercise, on lower med now Complications - hypoglycemia nephropathy with CKD III-IV, also in setting HTN - Contraindicated - Metformin / SGLT2. H/o failed Glyburide, Januvia. Not interested in injectable  Plan:  1. Detailed CBG log reviewed, still has occasional episodes of low CBG and hypoglycemia mild symptoms, on glipizide HALF tab BID (2.5mg ) - Discussion on hypoglycemia and risks - Agree to REDUCE dose further Glipizide half tab 2.5mg  dose ONCE daily only with largest meal - likely PM dosing with dinner - May still consider low dose Metformin in future if needed instead of sulfonylurea 3. Cannot take ASA due to CKD, continue statin, was on ACEi 4.  Encouraged lifestyle improvements with DM diet, continue walking 5. Follow-up 3 months for DM A1c - ideally can DC sulfonylurea and maintain DM with lifestyle      Hyperlipidemia associated with type 2 diabetes mellitus (HCC)    Self trial off statin with improved GI symptoms, x 2 weeks now back on Continue ASA 81 and Simva 40 Future consider switch statin      Hypertension    Well-controlled HTN, remains off all meds - Home BP readings normal Complication with CKD III followed by Nephrology [facility] Dr Candiss Norse   Plan:  1. Reviewed BP logs- remain off all anti-HTN therapy for now monitor BP - discuss ACEi with Nephrology 2. Encourage improved lifestyle - low sodium diet, continue walking regular exercise 3. Continue monitor BP outside office, bring readings to next visit, if persistently >140/90 or  new symptoms notify office sooner 4. Follow-up q 3 mo         No orders of the defined types were placed in this encounter.   Follow up plan: Return in about 3 months (around 03/03/2017) for DM A1c, HTN.  Nobie Putnam, DO [facility] 12/02/2016, 1:36 AM

## 2016-12-02 NOTE — Assessment & Plan Note (Signed)
Well-controlled HTN, remains off all meds - Home BP readings normal Complication with CKD III followed by Nephrology [facility] Dr Candiss Norse   Plan:  1. Reviewed BP logs- remain off all anti-HTN therapy for now monitor BP - discuss ACEi with Nephrology 2. Encourage improved lifestyle - low sodium diet, continue walking regular exercise 3. Continue monitor BP outside office, bring readings to next visit, if persistently >140/90 or new symptoms notify office sooner 4. Follow-up q 3 mo

## 2016-12-02 NOTE — Assessment & Plan Note (Addendum)
Remains very well controlled on SSRI Continue Lexapro 5mg  daily, improves sleep Follow-up

## 2016-12-02 NOTE — Assessment & Plan Note (Signed)
Remains improved/stable control A1c 6.8 (unchanged 6.8), secondary to lifestyle diet and exercise, on lower med now Complications - hypoglycemia nephropathy with CKD III-IV, also in setting HTN - Contraindicated - Metformin / SGLT2. H/o failed Glyburide, Januvia. Not interested in injectable  Plan:  1. Detailed CBG log reviewed, still has occasional episodes of low CBG and hypoglycemia mild symptoms, on glipizide HALF tab BID (2.5mg ) - Discussion on hypoglycemia and risks - Agree to REDUCE dose further Glipizide half tab 2.5mg  dose ONCE daily only with largest meal - likely PM dosing with dinner - May still consider low dose Metformin in future if needed instead of sulfonylurea 3. Cannot take ASA due to CKD, continue statin, was on ACEi 4. Encouraged lifestyle improvements with DM diet, continue walking 5. Follow-up 3 months for DM A1c - ideally can DC sulfonylurea and maintain DM with lifestyle

## 2016-12-02 NOTE — Assessment & Plan Note (Signed)
Self trial off statin with improved GI symptoms, x 2 weeks now back on Continue ASA 81 and Simva 40 Future consider switch statin

## 2017-01-27 DIAGNOSIS — R1084 Generalized abdominal pain: Secondary | ICD-10-CM | POA: Diagnosis not present

## 2017-02-09 ENCOUNTER — Encounter: Payer: Self-pay | Admitting: *Deleted

## 2017-02-10 ENCOUNTER — Encounter
Admission: RE | Disposition: A | Discharge status: Home / Self Care | Payer: Self-pay | Source: Ambulatory Visit | Attending: Unknown Physician Specialty

## 2017-02-10 ENCOUNTER — Ambulatory Visit: Payer: PPO | Admitting: Anesthesiology

## 2017-02-10 ENCOUNTER — Encounter: Payer: Self-pay | Admitting: *Deleted

## 2017-02-10 ENCOUNTER — Ambulatory Visit
Admission: RE | Admit: 2017-02-10 | Discharge: 2017-02-10 | Disposition: A | Discharge status: Home / Self Care | Payer: PPO | Source: Ambulatory Visit | Attending: Unknown Physician Specialty | Admitting: Unknown Physician Specialty

## 2017-02-10 DIAGNOSIS — F419 Anxiety disorder, unspecified: Secondary | ICD-10-CM | POA: Insufficient documentation

## 2017-02-10 DIAGNOSIS — Z7982 Long term (current) use of aspirin: Secondary | ICD-10-CM | POA: Diagnosis not present

## 2017-02-10 DIAGNOSIS — K219 Gastro-esophageal reflux disease without esophagitis: Secondary | ICD-10-CM | POA: Diagnosis not present

## 2017-02-10 DIAGNOSIS — E039 Hypothyroidism, unspecified: Secondary | ICD-10-CM | POA: Diagnosis not present

## 2017-02-10 DIAGNOSIS — I129 Hypertensive chronic kidney disease with stage 1 through stage 4 chronic kidney disease, or unspecified chronic kidney disease: Secondary | ICD-10-CM | POA: Diagnosis not present

## 2017-02-10 DIAGNOSIS — Z7984 Long term (current) use of oral hypoglycemic drugs: Secondary | ICD-10-CM | POA: Diagnosis not present

## 2017-02-10 DIAGNOSIS — D649 Anemia, unspecified: Secondary | ICD-10-CM | POA: Diagnosis not present

## 2017-02-10 DIAGNOSIS — E119 Type 2 diabetes mellitus without complications: Secondary | ICD-10-CM | POA: Diagnosis not present

## 2017-02-10 DIAGNOSIS — Z882 Allergy status to sulfonamides status: Secondary | ICD-10-CM | POA: Insufficient documentation

## 2017-02-10 DIAGNOSIS — Z1211 Encounter for screening for malignant neoplasm of colon: Secondary | ICD-10-CM | POA: Insufficient documentation

## 2017-02-10 DIAGNOSIS — N189 Chronic kidney disease, unspecified: Secondary | ICD-10-CM | POA: Diagnosis not present

## 2017-02-10 DIAGNOSIS — Z888 Allergy status to other drugs, medicaments and biological substances status: Secondary | ICD-10-CM | POA: Diagnosis not present

## 2017-02-10 DIAGNOSIS — K259 Gastric ulcer, unspecified as acute or chronic, without hemorrhage or perforation: Secondary | ICD-10-CM | POA: Diagnosis not present

## 2017-02-10 DIAGNOSIS — K222 Esophageal obstruction: Secondary | ICD-10-CM | POA: Diagnosis not present

## 2017-02-10 DIAGNOSIS — K648 Other hemorrhoids: Secondary | ICD-10-CM | POA: Diagnosis not present

## 2017-02-10 DIAGNOSIS — F329 Major depressive disorder, single episode, unspecified: Secondary | ICD-10-CM | POA: Diagnosis not present

## 2017-02-10 DIAGNOSIS — E1122 Type 2 diabetes mellitus with diabetic chronic kidney disease: Secondary | ICD-10-CM | POA: Insufficient documentation

## 2017-02-10 DIAGNOSIS — Z8619 Personal history of other infectious and parasitic diseases: Secondary | ICD-10-CM | POA: Diagnosis not present

## 2017-02-10 DIAGNOSIS — I10 Essential (primary) hypertension: Secondary | ICD-10-CM | POA: Diagnosis not present

## 2017-02-10 DIAGNOSIS — K317 Polyp of stomach and duodenum: Secondary | ICD-10-CM | POA: Diagnosis not present

## 2017-02-10 DIAGNOSIS — Z79899 Other long term (current) drug therapy: Secondary | ICD-10-CM | POA: Insufficient documentation

## 2017-02-10 DIAGNOSIS — M1990 Unspecified osteoarthritis, unspecified site: Secondary | ICD-10-CM | POA: Insufficient documentation

## 2017-02-10 DIAGNOSIS — R109 Unspecified abdominal pain: Secondary | ICD-10-CM | POA: Insufficient documentation

## 2017-02-10 DIAGNOSIS — R1084 Generalized abdominal pain: Secondary | ICD-10-CM | POA: Diagnosis not present

## 2017-02-10 DIAGNOSIS — Z222 Carrier of diphtheria: Secondary | ICD-10-CM | POA: Diagnosis not present

## 2017-02-10 DIAGNOSIS — K228 Other specified diseases of esophagus: Secondary | ICD-10-CM | POA: Diagnosis not present

## 2017-02-10 DIAGNOSIS — E785 Hyperlipidemia, unspecified: Secondary | ICD-10-CM | POA: Diagnosis not present

## 2017-02-10 DIAGNOSIS — Z86010 Personal history of colonic polyps: Secondary | ICD-10-CM | POA: Diagnosis not present

## 2017-02-10 HISTORY — DX: Depression, unspecified: F32.A

## 2017-02-10 HISTORY — PX: COLONOSCOPY WITH PROPOFOL: SHX5780

## 2017-02-10 HISTORY — PX: ESOPHAGOGASTRODUODENOSCOPY (EGD) WITH PROPOFOL: SHX5813

## 2017-02-10 HISTORY — DX: Personal history of other infectious and parasitic diseases: Z86.19

## 2017-02-10 LAB — GLUCOSE, CAPILLARY: Glucose-Capillary: 111 mg/dL — ABNORMAL HIGH (ref 65–99)

## 2017-02-10 SURGERY — ESOPHAGOGASTRODUODENOSCOPY (EGD) WITH PROPOFOL
Anesthesia: General

## 2017-02-10 MED ORDER — SODIUM CHLORIDE 0.9 % IV SOLN
INTRAVENOUS | Status: DC
  Administered 2017-02-10: 07:00:00 via INTRAVENOUS

## 2017-02-10 MED ORDER — GLYCOPYRROLATE 0.2 MG/ML IJ SOLN
INTRAMUSCULAR | Status: DC | PRN
  Administered 2017-02-10: 0.2 mg via INTRAVENOUS

## 2017-02-10 MED ORDER — PROPOFOL 500 MG/50ML IV EMUL
INTRAVENOUS | Status: DC | PRN
  Administered 2017-02-10: 130 ug/kg/min via INTRAVENOUS

## 2017-02-10 MED ORDER — SODIUM CHLORIDE 0.9 % IV SOLN: INTRAVENOUS | Status: DC

## 2017-02-10 MED ORDER — PROPOFOL 10 MG/ML IV BOLUS
INTRAVENOUS | Status: DC | PRN
  Administered 2017-02-10 (×2): 10 mg via INTRAVENOUS
  Administered 2017-02-10: 50 mg via INTRAVENOUS
  Administered 2017-02-10 (×2): 10 mg via INTRAVENOUS

## 2017-02-10 MED ORDER — PROPOFOL 500 MG/50ML IV EMUL
INTRAVENOUS | Status: AC
  Filled 2017-02-10: qty 50

## 2017-02-10 MED ORDER — PROPOFOL 10 MG/ML IV BOLUS
INTRAVENOUS | Status: AC
  Filled 2017-02-10: qty 20

## 2017-02-10 MED ORDER — LIDOCAINE HCL (CARDIAC) 20 MG/ML IV SOLN
INTRAVENOUS | Status: DC | PRN
  Administered 2017-02-10: 80 mg via INTRAVENOUS

## 2017-02-10 NOTE — Anesthesia Post-op Follow-up Note (Signed)
Anesthesia QCDR form completed.        

## 2017-02-10 NOTE — Anesthesia Preprocedure Evaluation (Signed)
Anesthesia Evaluation  Patient identified by MRN, date of birth, ID band Patient awake    Reviewed: Allergy & Precautions, NPO status , Patient's Chart, lab work & pertinent test results  History of Anesthesia Complications Negative for: history of anesthetic complications  Airway Mallampati: II  TM Distance: >3 FB Neck ROM: Full    Dental  (+) Upper Dentures, Lower Dentures   Pulmonary neg pulmonary ROS, neg sleep apnea, neg COPD,    breath sounds clear to auscultation- rhonchi (-) wheezing      Cardiovascular hypertension, Pt. on medications (-) CAD, (-) Past MI, (-) Cardiac Stents and (-) CABG  Rhythm:Regular Rate:Normal - Systolic murmurs and - Diastolic murmurs    Neuro/Psych PSYCHIATRIC DISORDERS Anxiety Depression negative neurological ROS     GI/Hepatic hiatal hernia, GERD  ,  Endo/Other  diabetes, Oral Hypoglycemic AgentsHypothyroidism   Renal/GU Renal InsufficiencyRenal disease     Musculoskeletal  (+) Arthritis ,   Abdominal (+) - obese,   Peds  Hematology  (+) anemia ,   Anesthesia Other Findings Past Medical History: No date: Arthritis No date: Chronic kidney disease     Comment:  chronic kidney disease No date: Depression No date: Dermatophytosis of nail No date: Diabetes type 2, controlled (HCC) No date: GERD (gastroesophageal reflux disease) No date: History of shingles No date: Hyperlipidemia No date: Hypertension No date: Keratoderma No date: Shingles   Reproductive/Obstetrics                             Anesthesia Physical Anesthesia Plan  ASA: III  Anesthesia Plan: General   Post-op Pain Management:    Induction: Intravenous  PONV Risk Score and Plan: 2 and Propofol infusion  Airway Management Planned: Natural Airway  Additional Equipment:   Intra-op Plan:   Post-operative Plan:   Informed Consent: I have reviewed the patients History and  Physical, chart, labs and discussed the procedure including the risks, benefits and alternatives for the proposed anesthesia with the patient or authorized representative who has indicated his/her understanding and acceptance.   Dental advisory given  Plan Discussed with: CRNA and Anesthesiologist  Anesthesia Plan Comments:         Anesthesia Quick Evaluation

## 2017-02-10 NOTE — Op Note (Signed)
[facility] Patient Name: Lisa Mills Procedure Date: 02/10/2017 7:32 AM MRN: [id number] Account #: [id number] Date of Birth: 1929-12-10 Admit Type: Outpatient Age: 84 Room: [facility] ENDO ROOM 4 Gender: Female Note Status: Finalized Procedure:            Colonoscopy Indications:          High risk colon cancer surveillance: Personal history                        of colonic polyps, Incidental abdominal pain noted Providers:            Manya Silvas, MD Medicines:            Propofol per Anesthesia Complications:        No immediate complications. Procedure:            Pre-Anesthesia Assessment:                       - After reviewing the risks and benefits, the patient                        was deemed in satisfactory condition to undergo the                        procedure.                       After obtaining informed consent, the colonoscope was                        passed under direct vision. Throughout the procedure,                        the patient's blood pressure, pulse, and oxygen                        saturations were monitored continuously. The                        Colonoscope was introduced through the anus and                        advanced to the the cecum, identified by appendiceal                        orifice and ileocecal valve. The colonoscopy was                        somewhat difficult due to inadequate bowel prep.                        Successful completion of the procedure was aided by                        lavage. The patient tolerated the procedure well. The                        quality of the bowel preparation was adequate to                        identify polyps 6 mm and larger  in size. Findings:      A small- moderate amount of stool was found in the sigmoid colon, in the       descending colon, in the transverse colon and in the ascending colon,       making visualization difficult. Lavage of the  areas was performed using       greater than 1019mL of sterile water, resulting in incomplete clearance       with fair visualization. No polyps were seen, no cancers, ileocecal       valve seen with certainty. view of colon lining was done throughout the       entire colon. Impression:           - Stool in the sigmoid colon, in the descending colon,                        in the transverse colon and in the ascending colon.                       - No specimens collected. Recommendation:       - The findings and recommendations were discussed with                        the patient's family. No further colon exam needs to be                        done, Manya Silvas, MD 02/10/2017 8:43:17 AM This report has been signed electronically. Number of Addenda: 0 Note Initiated On: 02/10/2017 7:32 AM Scope Withdrawal Time: 0 hours 9 minutes 6 seconds  Total Procedure Duration: 0 hours 26 minutes 10 seconds       [name]

## 2017-02-10 NOTE — Anesthesia Postprocedure Evaluation (Signed)
Anesthesia Post Note  Patient: Lisa Mills  Procedure(s) Performed: ESOPHAGOGASTRODUODENOSCOPY (EGD) WITH PROPOFOL (N/A ) COLONOSCOPY WITH PROPOFOL (N/A )  Patient location during evaluation: Endoscopy Anesthesia Type: General Level of consciousness: awake and alert and oriented Pain management: pain level controlled Vital Signs Assessment: post-procedure vital signs reviewed and stable Respiratory status: spontaneous breathing, nonlabored ventilation and respiratory function stable Cardiovascular status: blood pressure returned to baseline and stable Postop Assessment: no signs of nausea or vomiting Anesthetic complications: no     Last Vitals:  Vitals:   02/10/17 0900 02/10/17 0910  BP: 139/82 (!) 158/75  Pulse: 87 80  Resp: 12 15  Temp:    SpO2: 100% 94%    Last Pain:  Vitals:   02/10/17 0830  TempSrc: Tympanic                 Yostin Malacara

## 2017-02-10 NOTE — H&P (Signed)
Primary Care Physician:  Olin Hauser, DO Primary Gastroenterologist:  Dr. Vira Agar  Pre-Procedure History & Physical: HPI:  Lisa Mills is a 84 y.o. female is here for an endoscopy and colonoscopy.   Past Medical History:  Diagnosis Date  . Arthritis   . Chronic kidney disease    chronic kidney disease  . Depression   . Dermatophytosis of nail   . Diabetes type 2, controlled ([place])   . GERD (gastroesophageal reflux disease)   . History of shingles   . Hyperlipidemia   . Hypertension   . Keratoderma   . Shingles     Past Surgical History:  Procedure Laterality Date  . BREAST BIOPSY Right    neg  . cataracts    . COLONOSCOPY W/ POLYPECTOMY    . EYE SURGERY     bilateral cataracts  . HAND SURGERY    . KIDNEY STONE SURGERY    . TONSILECTOMY, ADENOIDECTOMY, BILATERAL MYRINGOTOMY AND TUBES    . TONSILLECTOMY    . TRIGGER FINGER RELEASE     Left ring finger  . tubercular peritonitis      Prior to Admission medications   Medication Sig Start Date End Date Taking? Authorizing Provider  glipiZIDE (GLUCOTROL) 5 MG tablet Take 2.5 mg daily with supper by mouth. Take half tab 05/24/16  Yes [provider]  levothyroxine (SYNTHROID, LEVOTHROID) 25 MCG tablet TAKE 1 TABLET(25 MCG) BY MOUTH EVERY DAY 30 TO 60 MINUTES BEFORE BREAKFAST ON AN EMPTY STOMACH AND WITH A GLASS OF WATER 02/25/16  Yes Karamalegos, Devonne Doughty, DO  lisinopril (PRINIVIL,ZESTRIL) 5 MG tablet Take 5 mg by mouth daily.   Yes [provider]  omeprazole (PRILOSEC) 20 MG capsule Take 1 capsule (20 mg total) by mouth daily. 02/25/16  Yes Karamalegos, Devonne Doughty, DO  ranitidine (ZANTAC) 150 MG tablet Take by mouth as needed.  07/03/16  Yes [provider]  simvastatin (ZOCOR) 40 MG tablet Take 1 tablet (40 mg total) by mouth at bedtime. 02/25/16  Yes Karamalegos, Devonne Doughty, DO  valACYclovir (VALTREX) 1000 MG tablet Take 1,000 mg by mouth 2 (two) times daily.   Yes [provider]  vitamin E 400 UNIT capsule Take by mouth.   Yes [provider]  baclofen (LIORESAL) 10 MG tablet Take 0.5-1 tablets (5-10 mg total) by mouth at bedtime as needed for muscle spasms. Patient not taking: Reported on 11/03/2016 02/25/16   Olin Hauser, DO  Biotin 1000 MCG tablet Take by mouth.    [provider]  Blood Glucose Monitoring Suppl ([place]) w/Device KIT Use glucometer to check blood sugar daily. Patient not taking: Reported on 12/01/2016 02/25/16   Olin Hauser, DO  cholecalciferol (VITAMIN D) 1000 units tablet Take 1,000 Units by mouth daily.    [provider]  docusate sodium (COLACE) 100 MG capsule Take by mouth.    [provider]  escitalopram (LEXAPRO) 5 MG tablet Take 1 tablet (5 mg total) by mouth daily. With food 11/18/16   Olin Hauser, DO  glucose blood test strip Check blood sugar up to 3 x daily. Dx:E11.9 04/14/16   Karamalegos, Devonne Doughty, DO  linaclotide (LINZESS) 145 MCG CAPS capsule Take by mouth. 09/22/16 10/22/16  [provider]  Jonetta Speak LANCETS 02O MISC 1 each by Does not apply route 3 (three) times daily. Dx:E11.9 04/14/16   Karamalegos, Devonne Doughty, DO  Polyethylene Glycol 3350 (MIRALAX PO) Take by mouth.  [provider]  Wheat Dextrin (BENEFIBER) POWD Take by mouth.    [provider]    Allergies as of 01/27/2017 - Review Complete 12/01/2016  Allergen Reaction Noted  . Aspirin Other (See Comments) 02/25/2016  . Dye fdc red [red dye] Hives 02/25/2016  . Sulfa antibiotics  02/25/2016    Family History  Adopted: Yes    Social History   Socioeconomic History  . Marital status: Married    Spouse name: Not on file  . Number of children: 2  . Years of education: Not on file  . Highest education level: Not on file  Social Needs  . Financial resource strain: Not on file  . Food insecurity - worry: Not on file  . Food  insecurity - inability: Not on file  . Transportation needs - medical: Not on file  . Transportation needs - non-medical: Not on file  Occupational History  . Not on file  Tobacco Use  . Smoking status: Never Smoker  . Smokeless tobacco: Never Used  Substance and Sexual Activity  . Alcohol use: Yes    Comment: .6 oz per week  . Drug use: No  . Sexual activity: Not on file  Other Topics Concern  . Not on file  Social History Narrative  . Not on file    Review of Systems: See HPI, otherwise negative ROS  Physical Exam: BP 118/67   Pulse 73   Temp 97.8 F (36.6 C) (Tympanic)   Resp 18   Ht 4' 11.5" (1.511 m)   Wt 54.4 kg (120 lb)   SpO2 100%   BMI 23.83 kg/m  General:   Alert,  pleasant and cooperative in NAD Head:  Normocephalic and atraumatic. Neck:  Supple; no masses or thyromegaly. Lungs:  Clear throughout to auscultation.    Heart:  Regular rate and rhythm. Abdomen:  Soft, nontender and nondistended. Normal bowel sounds, without guarding, and without rebound.   Neurologic:  Alert and  oriented x4;  grossly normal neurologically.  Impression/Plan: Lisa Mills is here for an endoscopy and colonoscopy to be performed for [facility] colon polyps and chronic anemia.  Risks, benefits, limitations, and alternatives regarding  endoscopy and colonoscopy have been reviewed with the patient.  Questions have been answered.  All parties agreeable.   Gaylyn Cheers, MD  02/10/2017, 7:32 AM

## 2017-02-10 NOTE — Transfer of Care (Signed)
Immediate Anesthesia Transfer of Care Note  Patient: Lisa Mills  Procedure(s) Performed: ESOPHAGOGASTRODUODENOSCOPY (EGD) WITH PROPOFOL (N/A ) COLONOSCOPY WITH PROPOFOL (N/A )  Patient Location: PACU  Anesthesia Type:General  Level of Consciousness: awake, alert  and oriented  Airway & Oxygen Therapy: Patient Spontanous Breathing and Patient connected to nasal cannula oxygen  Post-op Assessment: Report given to RN and Post -op Vital signs reviewed and stable  Post vital signs: Reviewed and stable  Last Vitals:  Vitals:   02/10/17 0653  BP: 118/67  Pulse: 73  Resp: 18  Temp: 36.6 C  SpO2: 100%    Last Pain:  Vitals:   02/10/17 0653  TempSrc: Tympanic         Complications: No apparent anesthesia complications

## 2017-02-10 NOTE — Op Note (Signed)
[facility] Patient Name: Lisa Mills Procedure Date: 02/10/2017 7:32 AM MRN: [id number] Account #: [id number] Date of Birth: 06-22-1929 Admit Type: Outpatient Age: 84 Room: [facility] ENDO ROOM 4 Gender: Female Note Status: Finalized Procedure:            Upper GI endoscopy Indications:          Dysphagia Providers:            Manya Silvas, MD Medicines:            Propofol per Anesthesia Complications:        No immediate complications. Procedure:            Pre-Anesthesia Assessment:                       - After reviewing the risks and benefits, the patient                        was deemed in satisfactory condition to undergo the                        procedure.                       After obtaining informed consent, the endoscope was                        passed under direct vision. Throughout the procedure,                        the patient's blood pressure, pulse, and oxygen                        saturations were monitored continuously. The Endoscope                        was introduced through the mouth, and advanced to the                        second part of duodenum. The upper GI endoscopy was                        accomplished without difficulty. The patient tolerated                        the procedure well. Findings:      A mild Schatzki ring (acquired) was found at the gastroesophageal       junction. At the end of the procedure A guidewire was placed and the       scope was withdrawn. Dilation was performed with a Savary dilator with       mild resistance at 15 mm and 16 mm.      One non-bleeding superficial gastric ulcer with no stigmata of bleeding       was found in the prepyloric region of the stomach. The lesion was 3 mm       in largest dimension.      Multiple small sessile polyps with no bleeding and no stigmata of recent       bleeding were found in the gastric body. Fundic type polyps.      The examined  duodenum was normal. Impression:           -  Mild Schatzki ring. Dilated.                       - Non-bleeding gastric ulcer with no stigmata of                        bleeding.                       - Multiple gastric polyps.                       - Normal examined duodenum.                       - No specimens collected. Recommendation:       - The findings and recommendations were discussed with                        the patient's family. Manya Silvas, MD 02/10/2017 8:05:38 AM This report has been signed electronically. Number of Addenda: 0 Note Initiated On: 02/10/2017 7:32 AM      [facility]

## 2017-02-11 ENCOUNTER — Encounter: Payer: Self-pay | Admitting: Unknown Physician Specialty

## 2017-02-12 ENCOUNTER — Telehealth: Payer: Self-pay | Admitting: Family Medicine

## 2017-02-12 NOTE — Telephone Encounter (Signed)
Pt. Called requesting  To  Change Drug  Store to  [facility]  In  [place]

## 2017-02-12 NOTE — Telephone Encounter (Signed)
Changed.

## 2017-03-03 ENCOUNTER — Ambulatory Visit (INDEPENDENT_AMBULATORY_CARE_PROVIDER_SITE_OTHER): Payer: PPO | Admitting: Family Medicine

## 2017-03-03 ENCOUNTER — Encounter: Payer: Self-pay | Admitting: Family Medicine

## 2017-03-03 VITALS — BP 123/54 | HR 62 | Temp 98.7°F | Resp 16 | Ht 59.0 in | Wt 122.0 lb

## 2017-03-03 DIAGNOSIS — E785 Hyperlipidemia, unspecified: Secondary | ICD-10-CM | POA: Diagnosis not present

## 2017-03-03 DIAGNOSIS — N183 Chronic kidney disease, stage 3 unspecified: Secondary | ICD-10-CM

## 2017-03-03 DIAGNOSIS — E1169 Type 2 diabetes mellitus with other specified complication: Secondary | ICD-10-CM

## 2017-03-03 DIAGNOSIS — E1122 Type 2 diabetes mellitus with diabetic chronic kidney disease: Secondary | ICD-10-CM

## 2017-03-03 DIAGNOSIS — E782 Mixed hyperlipidemia: Secondary | ICD-10-CM | POA: Diagnosis not present

## 2017-03-03 DIAGNOSIS — E1121 Type 2 diabetes mellitus with diabetic nephropathy: Secondary | ICD-10-CM

## 2017-03-03 DIAGNOSIS — I10 Essential (primary) hypertension: Secondary | ICD-10-CM | POA: Diagnosis not present

## 2017-03-03 LAB — POCT GLYCOSYLATED HEMOGLOBIN (HGB A1C): Hemoglobin A1C: 7 — AB (ref ?–5.7)

## 2017-03-03 MED ORDER — GLUCOSE BLOOD VI STRP: ORAL_STRIP | 11 refills | Status: DC

## 2017-03-03 MED ORDER — SIMVASTATIN 40 MG PO TABS: 40.0000 mg | ORAL_TABLET | Freq: Every day | ORAL | 3 refills | Status: DC

## 2017-03-03 MED ORDER — LISINOPRIL 5 MG PO TABS: 5.0000 mg | ORAL_TABLET | Freq: Every day | ORAL | 3 refills | Status: DC

## 2017-03-03 NOTE — Patient Instructions (Addendum)
Thank you for coming to the office today.  1. Keep up the good work  Rx Lisinopril 5mg  daily - sent to pharmacy, continue EVERY day now to see how it goes - check BP monitor readings  Refilled Simvastatin  May take either ONCE or TWICE daily GLipizide half pill for 2.5mg  - if low sugars then reduce dose, notify office  A1c 7.0, keep up the good work  DUE for FASTING BLOOD WORK (no food or drink after midnight before the lab appointment, only water or coffee without cream/sugar on the morning of)  SCHEDULE "Lab Only" visit in the morning at the clinic for lab draw in 6 MONTHS  \ - Make sure Lab Only appointment is at about 1 week before your next appointment, so that results will be available  For Lab Results, once available within 2-3 days of blood draw, you can can log in to MyChart online to view your results and a brief explanation. Also, we can discuss results at next follow-up visit.   Please schedule a Follow-up Appointment to: Return in about 6 months (around 08/31/2017) for Annual Physical.    If you have any other questions or concerns, please feel free to call the office or send a message through [place]. You may also schedule an earlier appointment if necessary.  Additionally, you may be receiving a survey about your experience at our office within a few days to 1 week by e-mail or mail. We value your feedback.  Nobie Putnam, DO [place]

## 2017-03-03 NOTE — Progress Notes (Signed)
Subjective:    Patient ID: Lisa Mills, female    DOB: [birth year], 84 y.o.   MRN: [id number]  Lisa Mills is a 84 y.o. female presenting on 03/03/2017 for Diabetes (highest 139 and lowest 110) and Hypertension   HPI   CHRONIC DM, Type 2: Holidays worsened, back to half tab Glipizide 2.5mg  TWICE daily Reports no concerns, previously she had A1c up to 8.1 in 05/2016, last reading 6.8 CBGs: Avg 120-140, Low >110(no significant hypoglycemia), Checks CBGs 2x daily Meds: was off glipizide then resumed Reports good compliance. Tolerating well w/o side-effects Currently no longer on ACEi/ARB - due for Urine Microalbumin (now off ACEi) Lifestyle: - Diet (improved DM diet - Exercise (walking occasionally, not regularly) Denies hypoglycemia, polyuria, visual changes, numbness or tingling.  CHRONIC HTN: Reports reviewed home BP log is slightly elevated Current Meds - None - (off of Lisinopril)   Denies CP, dyspnea, HA, edema, dizziness / lightheadedness  HYPERLIPIDEMIA: Needs refill on stat - Currently taking Simvastatin 40mg , tolerating well without side effects or myalgias  Depression screen [facility] 2/9 03/03/2017 12/02/2016 11/03/2016  Decreased Interest 0 0 0  Down, Depressed, Hopeless 0 0 0  PHQ - 2 Score 0 0 0  Altered sleeping 0 2 -  Tired, decreased energy 0 0 -  Change in appetite 0 0 -  Feeling bad or failure about yourself  0 0 -  Trouble concentrating 0 0 -  Moving slowly or fidgety/restless 0 0 -  Suicidal thoughts 0 0 -  PHQ-9 Score 0 2 -  Difficult doing work/chores Not difficult at all Not difficult at all -    Social History   Tobacco Use  . Smoking status: Never Smoker  . Smokeless tobacco: Never Used  Substance Use Topics  . Alcohol use: Yes    Comment: .6 oz per week  . Drug use: No    Review of Systems Per HPI unless specifically indicated above     Objective:    BP (!) 123/54   Pulse 62   Temp 98.7 F (37.1 C) (Oral)   Resp 16   Ht 4'  11" (1.499 m)   Wt 122 lb (55.3 kg)   BMI 24.64 kg/m   Wt Readings from Last 3 Encounters:  03/03/17 122 lb (55.3 kg)  02/10/17 120 lb (54.4 kg)  12/01/16 117 lb (53.1 kg)    Physical Exam  Constitutional: She is oriented to person, place, and time. She appears well-developed and well-nourished. No distress.  Well-appearing 84 yr female, comfortable, cooperative  HENT:  Head: Normocephalic and atraumatic.  Mouth/Throat: Oropharynx is clear and moist.  Eyes: Conjunctivae are normal. Right eye exhibits no discharge. Left eye exhibits no discharge.  Neck: Normal range of motion. Neck supple.  Cardiovascular: Normal rate, regular rhythm, normal heart sounds and intact distal pulses.  No murmur heard. Pulmonary/Chest: Effort normal and breath sounds normal. No respiratory distress. She has no wheezes. She has no rales.  Musculoskeletal: Normal range of motion. She exhibits no edema.  Neurological: She is alert and oriented to person, place, and time.  Skin: Skin is warm and dry. No rash noted. She is not diaphoretic. No erythema.  Psychiatric: She has a normal mood and affect. Her behavior is normal.  Well groomed, good eye contact, normal speech and thoughts  Nursing note and vitals reviewed.    Recent Labs    08/19/16 0802 12/01/16 1721 03/03/17 1131  HGBA1C 6.8* 6.8* 7.0*    Results for  orders placed or performed in visit on 03/03/17  POCT HgB A1C  Result Value Ref Range   Hemoglobin A1C 7.0 (A) 5.7      Assessment & Plan:   Problem List Items Addressed This Visit    CKD stage 3 due to type 2 diabetes mellitus ([place])    Stable to gradual improvement CKD-III, secondary to HTN DM age Followed by Nephrology [facility]  Plan: 1. Resume Lisinopril low dose 5mg  - consider reduce to 2.5 in future if need      Relevant Medications   lisinopril (PRINIVIL,ZESTRIL) 5 MG tablet   simvastatin (ZOCOR) 40 MG tablet   Controlled type 2 diabetes mellitus with diabetic nephropathy (HCC)  - Primary    Stable DM A1c 7.0 from 6.8 Complications - hypoglycemia nephropathy with CKD III-IV, also in setting HTN - Contraindicated - Metformin / SGLT2. H/o failed Glyburide, Januvia. Not interested in injectable  Plan:  1. Without hypoglycemia, but concern with some elevated sugars, overall felt better with controlled sugar on half tab glipizide, advised may proceed with caution with adding back half tab 1-2 times daily monitor closely - Discussion on hypoglycemia and risks 3. Cannot take ASA due to CKD, continue statin, was on ACEi 4. Encouraged lifestyle improvements with DM diet, continue walking 5. Follow-up 6 months - reconsider sulfonylurea in future      Relevant Medications   lisinopril (PRINIVIL,ZESTRIL) 5 MG tablet   glucose blood test strip   simvastatin (ZOCOR) 40 MG tablet   Other Relevant Orders   POCT HgB A1C (Completed)   Hyperlipidemia associated with type 2 diabetes mellitus (HCC)    Refill Simvastatin      Relevant Medications   lisinopril (PRINIVIL,ZESTRIL) 5 MG tablet   simvastatin (ZOCOR) 40 MG tablet   Hypertension    Well-controlled HTN, remains off all meds - Home BP readings normal Complication with CKD III followed by Nephrology [facility] Dr Candiss Norse    Plan:  1. Agree to restart low dose ACEi - Lisinopril 5mg  daily may need lower dose 2.5 in future, goal for help protect renal function, maintain BP 2. Encourage improved lifestyle - low sodium diet, continue walking regular exercise 3. Continue monitor BP outside office, bring readings to next visit, if persistently >140/90 or new symptoms notify office sooner 4. Follow-up 6 months annual      Relevant Medications   lisinopril (PRINIVIL,ZESTRIL) 5 MG tablet   simvastatin (ZOCOR) 40 MG tablet    Other Visit Diagnoses    Mixed hyperlipidemia       Relevant Medications   lisinopril (PRINIVIL,ZESTRIL) 5 MG tablet   simvastatin (ZOCOR) 40 MG tablet      Meds ordered this encounter  Medications  .  lisinopril (PRINIVIL,ZESTRIL) 5 MG tablet    Sig: Take 1 tablet (5 mg total) by mouth daily.    Dispense:  90 tablet    Refill:  3  . glucose blood test strip    Sig: Check blood sugar up to 2 x daily. Dx:E11.9    Dispense:  100 each    Refill:  11    90 day supply test strips, please dispense appropriate test strips for [employer]  . simvastatin (ZOCOR) 40 MG tablet    Sig: Take 1 tablet (40 mg total) by mouth at bedtime.    Dispense:  90 tablet    Refill:  3    Follow up plan: Return in about 6 months (around 08/31/2017) for Annual Physical.  Future labs ordered  for 08/2017  Nobie Putnam, [facility] 03/04/2017, 1:52 AM

## 2017-03-04 ENCOUNTER — Other Ambulatory Visit: Payer: Self-pay | Admitting: Family Medicine

## 2017-03-04 DIAGNOSIS — E1121 Type 2 diabetes mellitus with diabetic nephropathy: Secondary | ICD-10-CM

## 2017-03-04 DIAGNOSIS — K253 Acute gastric ulcer without hemorrhage or perforation: Secondary | ICD-10-CM | POA: Diagnosis not present

## 2017-03-04 DIAGNOSIS — R1032 Left lower quadrant pain: Secondary | ICD-10-CM | POA: Diagnosis not present

## 2017-03-04 DIAGNOSIS — Z0000 Encounter for general adult medical examination without abnormal findings: Secondary | ICD-10-CM

## 2017-03-04 DIAGNOSIS — K219 Gastro-esophageal reflux disease without esophagitis: Secondary | ICD-10-CM | POA: Diagnosis not present

## 2017-03-04 DIAGNOSIS — E1169 Type 2 diabetes mellitus with other specified complication: Secondary | ICD-10-CM

## 2017-03-04 DIAGNOSIS — E1122 Type 2 diabetes mellitus with diabetic chronic kidney disease: Secondary | ICD-10-CM

## 2017-03-04 DIAGNOSIS — K5900 Constipation, unspecified: Secondary | ICD-10-CM | POA: Diagnosis not present

## 2017-03-04 DIAGNOSIS — I10 Essential (primary) hypertension: Secondary | ICD-10-CM

## 2017-03-04 DIAGNOSIS — N183 Chronic kidney disease, stage 3 (moderate): Secondary | ICD-10-CM

## 2017-03-04 DIAGNOSIS — K222 Esophageal obstruction: Secondary | ICD-10-CM | POA: Diagnosis not present

## 2017-03-04 DIAGNOSIS — E785 Hyperlipidemia, unspecified: Secondary | ICD-10-CM

## 2017-03-04 NOTE — Assessment & Plan Note (Signed)
Refill Simvastatin

## 2017-03-04 NOTE — Assessment & Plan Note (Signed)
Stable to gradual improvement CKD-III, secondary to HTN DM age Followed by Nephrology [facility]  Plan: 1. Resume Lisinopril low dose 5mg  - consider reduce to 2.5 in future if need

## 2017-03-04 NOTE — Assessment & Plan Note (Signed)
Stable DM A1c 7.0 from 6.8 Complications - hypoglycemia nephropathy with CKD III-IV, also in setting HTN - Contraindicated - Metformin / SGLT2. H/o failed Glyburide, Januvia. Not interested in injectable  Plan:  1. Without hypoglycemia, but concern with some elevated sugars, overall felt better with controlled sugar on half tab glipizide, advised may proceed with caution with adding back half tab 1-2 times daily monitor closely - Discussion on hypoglycemia and risks 3. Cannot take ASA due to CKD, continue statin, was on ACEi 4. Encouraged lifestyle improvements with DM diet, continue walking 5. Follow-up 6 months - reconsider sulfonylurea in future

## 2017-03-04 NOTE — Assessment & Plan Note (Signed)
Well-controlled HTN, remains off all meds - Home BP readings normal Complication with CKD III followed by Nephrology [facility] Dr Candiss Norse    Plan:  1. Agree to restart low dose ACEi - Lisinopril 5mg  daily may need lower dose 2.5 in future, goal for help protect renal function, maintain BP 2. Encourage improved lifestyle - low sodium diet, continue walking regular exercise 3. Continue monitor BP outside office, bring readings to next visit, if persistently >140/90 or new symptoms notify office sooner 4. Follow-up 6 months annual

## 2017-03-24 ENCOUNTER — Other Ambulatory Visit: Payer: Self-pay | Admitting: Family Medicine

## 2017-03-24 ENCOUNTER — Telehealth: Payer: Self-pay | Admitting: Family Medicine

## 2017-03-24 DIAGNOSIS — E063 Autoimmune thyroiditis: Principal | ICD-10-CM

## 2017-03-24 DIAGNOSIS — E038 Other specified hypothyroidism: Secondary | ICD-10-CM

## 2017-03-24 MED ORDER — LEVOTHYROXINE SODIUM 25 MCG PO TABS: ORAL_TABLET | ORAL | 0 refills | Status: DC

## 2017-03-24 NOTE — Telephone Encounter (Signed)
Pt needs a refill on levothyroxine sent to [facility] in [place].  Please note pharmacy change.

## 2017-03-24 NOTE — Telephone Encounter (Signed)
Rx send

## 2017-03-29 DIAGNOSIS — I10 Essential (primary) hypertension: Secondary | ICD-10-CM | POA: Diagnosis not present

## 2017-03-29 DIAGNOSIS — E1129 Type 2 diabetes mellitus with other diabetic kidney complication: Secondary | ICD-10-CM | POA: Diagnosis not present

## 2017-03-29 DIAGNOSIS — E875 Hyperkalemia: Secondary | ICD-10-CM | POA: Diagnosis not present

## 2017-03-29 DIAGNOSIS — N183 Chronic kidney disease, stage 3 (moderate): Secondary | ICD-10-CM | POA: Diagnosis not present

## 2017-03-31 ENCOUNTER — Encounter: Payer: Self-pay | Admitting: Family Medicine

## 2017-03-31 LAB — URINALYSIS
BLOOD UA: NEGATIVE
Bilirubin, UA: NEGATIVE — AB
Creatinine, Ur: 134.9 mg/dL
GLUCOSE, UA: NEGATIVE — AB
KETONES UA: NEGATIVE
PROTEIN UR: 18
PROTEIN, URINE: NEGATIVE
PROTEIN/CREAT RATIO: 133
Specific Gravity, UA: 1.018
Urobilinogen, UA: NORMAL
pH, UA: 5.5 (ref 4.5–8.0)

## 2017-05-19 ENCOUNTER — Other Ambulatory Visit: Payer: Self-pay

## 2017-05-19 DIAGNOSIS — K219 Gastro-esophageal reflux disease without esophagitis: Secondary | ICD-10-CM

## 2017-05-19 MED ORDER — OMEPRAZOLE 20 MG PO CPDR: 20.0000 mg | DELAYED_RELEASE_CAPSULE | Freq: Every day | ORAL | 3 refills | Status: DC

## 2017-05-31 DIAGNOSIS — M25552 Pain in left hip: Secondary | ICD-10-CM | POA: Diagnosis not present

## 2017-05-31 DIAGNOSIS — M1612 Unilateral primary osteoarthritis, left hip: Secondary | ICD-10-CM | POA: Diagnosis not present

## 2017-06-03 ENCOUNTER — Other Ambulatory Visit: Payer: Self-pay | Admitting: Family Medicine

## 2017-06-03 DIAGNOSIS — K2100 Gastro-esophageal reflux disease with esophagitis, without bleeding: Secondary | ICD-10-CM

## 2017-06-03 MED ORDER — RANITIDINE HCL 150 MG PO TABS: 150.0000 mg | ORAL_TABLET | ORAL | 3 refills | Status: DC | PRN

## 2017-06-03 NOTE — Telephone Encounter (Signed)
Pt was seeing Dr. Vira Agar but she has been discharged.  She needs a refill on zantac sent to [facility] and asked if Dr. Raliegh Ip could sent prescription for 150 mg once daily.  Her call back number is [phone]

## 2017-06-21 ENCOUNTER — Other Ambulatory Visit: Payer: Self-pay | Admitting: Family Medicine

## 2017-06-21 ENCOUNTER — Other Ambulatory Visit: Payer: Self-pay

## 2017-06-21 ENCOUNTER — Telehealth: Payer: Self-pay | Admitting: Family Medicine

## 2017-06-21 DIAGNOSIS — E038 Other specified hypothyroidism: Secondary | ICD-10-CM

## 2017-06-21 DIAGNOSIS — E063 Autoimmune thyroiditis: Principal | ICD-10-CM

## 2017-06-21 DIAGNOSIS — E039 Hypothyroidism, unspecified: Secondary | ICD-10-CM

## 2017-06-21 MED ORDER — LEVOTHYROXINE SODIUM 25 MCG PO TABS: ORAL_TABLET | ORAL | 0 refills | Status: DC

## 2017-06-21 NOTE — Telephone Encounter (Signed)
Send for approval.

## 2017-06-21 NOTE — Telephone Encounter (Signed)
Pt. Called  requesting refill onLevothyroxine 90 day supply [facility]

## 2017-07-21 ENCOUNTER — Other Ambulatory Visit: Payer: Self-pay | Admitting: Family Medicine

## 2017-07-21 DIAGNOSIS — Z1231 Encounter for screening mammogram for malignant neoplasm of breast: Secondary | ICD-10-CM

## 2017-08-03 DIAGNOSIS — M461 Sacroiliitis, not elsewhere classified: Secondary | ICD-10-CM | POA: Diagnosis not present

## 2017-08-03 DIAGNOSIS — M1612 Unilateral primary osteoarthritis, left hip: Secondary | ICD-10-CM | POA: Diagnosis not present

## 2017-08-20 ENCOUNTER — Other Ambulatory Visit: Payer: PPO

## 2017-08-20 DIAGNOSIS — E1122 Type 2 diabetes mellitus with diabetic chronic kidney disease: Secondary | ICD-10-CM | POA: Diagnosis not present

## 2017-08-20 DIAGNOSIS — E1169 Type 2 diabetes mellitus with other specified complication: Secondary | ICD-10-CM | POA: Diagnosis not present

## 2017-08-20 DIAGNOSIS — E1121 Type 2 diabetes mellitus with diabetic nephropathy: Secondary | ICD-10-CM | POA: Diagnosis not present

## 2017-08-20 DIAGNOSIS — E039 Hypothyroidism, unspecified: Secondary | ICD-10-CM | POA: Diagnosis not present

## 2017-08-21 LAB — CBC WITH DIFFERENTIAL/PLATELET
BASOS ABS: 51 {cells}/uL (ref 0–200)
BASOS PCT: 0.8 %
EOS PCT: 2.7 %
Eosinophils Absolute: 173 cells/uL (ref 15–500)
HEMATOCRIT: 33.5 % — AB (ref 35.0–45.0)
HEMOGLOBIN: 11 g/dL — AB (ref 11.7–15.5)
Lymphs Abs: 2413 cells/uL (ref 850–3900)
MCH: 30.7 pg (ref 27.0–33.0)
MCHC: 32.8 g/dL (ref 32.0–36.0)
MCV: 93.6 fL (ref 80.0–100.0)
MONOS PCT: 9.1 %
MPV: 9.5 fL (ref 7.5–12.5)
NEUTROS PCT: 49.7 %
Neutro Abs: 3181 cells/uL (ref 1500–7800)
Platelets: 248 10*3/uL (ref 140–400)
RBC: 3.58 10*6/uL — ABNORMAL LOW (ref 3.80–5.10)
RDW: 12.9 % (ref 11.0–15.0)
Total Lymphocyte: 37.7 %
WBC mixed population: 582 cells/uL (ref 200–950)
WBC: 6.4 10*3/uL (ref 3.8–10.8)

## 2017-08-21 LAB — LIPID PANEL
CHOL/HDL RATIO: 3.4 (calc) (ref <5.0)
CHOLESTEROL: 208 mg/dL — AB (ref <200)
HDL: 62 mg/dL (ref >50)
LDL CHOLESTEROL (CALC): 118 mg/dL — AB
NON-HDL CHOLESTEROL (CALC): 146 mg/dL — AB (ref <130)
Triglycerides: 168 mg/dL — ABNORMAL HIGH (ref <150)

## 2017-08-21 LAB — COMPLETE METABOLIC PANEL WITH GFR
AG RATIO: 1.9 (calc) (ref 1.0–2.5)
ALKALINE PHOSPHATASE (APISO): 31 U/L — AB (ref 33–130)
ALT: 8 U/L (ref 6–29)
AST: 14 U/L (ref 10–35)
Albumin: 4.1 g/dL (ref 3.6–5.1)
BUN/Creatinine Ratio: 15 (calc) (ref 6–22)
BUN: 24 mg/dL (ref 7–25)
CALCIUM: 9.5 mg/dL (ref 8.6–10.4)
CHLORIDE: 105 mmol/L (ref 98–110)
CO2: 28 mmol/L (ref 20–32)
Creat: 1.56 mg/dL — ABNORMAL HIGH (ref 0.60–0.88)
GFR, EST NON AFRICAN AMERICAN: 30 mL/min/{1.73_m2} — AB (ref >=60)
GFR, Est African American: 35 mL/min/{1.73_m2} — ABNORMAL LOW (ref >=60)
GLOBULIN: 2.2 g/dL (ref 1.9–3.7)
Glucose, Bld: 104 mg/dL — ABNORMAL HIGH (ref 65–99)
POTASSIUM: 4.6 mmol/L (ref 3.5–5.3)
SODIUM: 141 mmol/L (ref 135–146)
Total Bilirubin: 0.5 mg/dL (ref 0.2–1.2)
Total Protein: 6.3 g/dL (ref 6.1–8.1)

## 2017-08-21 LAB — TSH: TSH: 2.95 m[IU]/L (ref 0.40–4.50)

## 2017-08-21 LAB — T4, FREE: FREE T4: 1.1 ng/dL (ref 0.8–1.8)

## 2017-08-21 LAB — HEMOGLOBIN A1C
EAG (MMOL/L): 9 (calc)
Hgb A1c MFr Bld: 7.3 % of total Hgb — ABNORMAL HIGH (ref <5.7)
MEAN PLASMA GLUCOSE: 163 (calc)

## 2017-08-27 ENCOUNTER — Encounter: Payer: Self-pay | Admitting: Family Medicine

## 2017-08-27 ENCOUNTER — Ambulatory Visit (INDEPENDENT_AMBULATORY_CARE_PROVIDER_SITE_OTHER): Payer: PPO | Admitting: Family Medicine

## 2017-08-27 VITALS — BP 114/59 | HR 64 | Temp 98.6°F | Resp 16 | Ht 59.0 in | Wt 129.6 lb

## 2017-08-27 DIAGNOSIS — M47812 Spondylosis without myelopathy or radiculopathy, cervical region: Secondary | ICD-10-CM | POA: Diagnosis not present

## 2017-08-27 DIAGNOSIS — D631 Anemia in chronic kidney disease: Secondary | ICD-10-CM

## 2017-08-27 DIAGNOSIS — E038 Other specified hypothyroidism: Secondary | ICD-10-CM

## 2017-08-27 DIAGNOSIS — M159 Polyosteoarthritis, unspecified: Secondary | ICD-10-CM

## 2017-08-27 DIAGNOSIS — E063 Autoimmune thyroiditis: Secondary | ICD-10-CM | POA: Diagnosis not present

## 2017-08-27 DIAGNOSIS — E1122 Type 2 diabetes mellitus with diabetic chronic kidney disease: Secondary | ICD-10-CM | POA: Diagnosis not present

## 2017-08-27 DIAGNOSIS — E1169 Type 2 diabetes mellitus with other specified complication: Secondary | ICD-10-CM | POA: Diagnosis not present

## 2017-08-27 DIAGNOSIS — F418 Other specified anxiety disorders: Secondary | ICD-10-CM

## 2017-08-27 DIAGNOSIS — Z0000 Encounter for general adult medical examination without abnormal findings: Secondary | ICD-10-CM

## 2017-08-27 DIAGNOSIS — N183 Chronic kidney disease, stage 3 unspecified: Secondary | ICD-10-CM

## 2017-08-27 DIAGNOSIS — E1121 Type 2 diabetes mellitus with diabetic nephropathy: Secondary | ICD-10-CM | POA: Diagnosis not present

## 2017-08-27 DIAGNOSIS — E785 Hyperlipidemia, unspecified: Secondary | ICD-10-CM

## 2017-08-27 DIAGNOSIS — M150 Primary generalized (osteo)arthritis: Secondary | ICD-10-CM | POA: Diagnosis not present

## 2017-08-27 DIAGNOSIS — M8949 Other hypertrophic osteoarthropathy, multiple sites: Secondary | ICD-10-CM

## 2017-08-27 DIAGNOSIS — K5909 Other constipation: Secondary | ICD-10-CM | POA: Diagnosis not present

## 2017-08-27 MED ORDER — BACLOFEN 10 MG PO TABS
5.0000 mg | ORAL_TABLET | Freq: Every evening | ORAL | 3 refills | Status: DC | PRN
Start: 2017-08-27 — End: 2018-09-12

## 2017-08-27 MED ORDER — POLYETHYLENE GLYCOL 3350 17 GM/SCOOP PO POWD: 17.0000 g | Freq: Every day | ORAL | 0 refills | Status: DC | PRN

## 2017-08-27 MED ORDER — GLIMEPIRIDE 4 MG PO TABS: 2.0000 mg | ORAL_TABLET | Freq: Every day | ORAL | 3 refills | Status: DC

## 2017-08-27 NOTE — Assessment & Plan Note (Signed)
Relatively CKD-III, secondary to HTN DM age Followed by Nephrology [facility] Dr Candiss Norse  Plan: 1. Continue ACEi Lisinopril low dose 5mg  Improve hydration Follow-up

## 2017-08-27 NOTE — Assessment & Plan Note (Signed)
Mostly controlled cholesterol on statin and lifestyle Last lipid panel 08/2017  Plan: 1. Continue current meds - Simvastatin 40mg  daily - no change today 2. Encourage improved lifestyle - low carb/cholesterol, reduce portion size, continue improving regular exercise

## 2017-08-27 NOTE — Patient Instructions (Addendum)
Thank you for coming to the office today.  Keep up the good work overall  Return for [place] for Flu Vaccine when ready - call to check status  A1c 7.3 - only slightly higher - should improve with continued treatment and good food choices  STOP Glipizide START Glimepride (Amaryl) may try half tab daily with meal - either breakfast or lunch - in future can take WHOLE pill if you prefer  Continue Lexapro seems to be helping  I think hair thinning may be due to age but the thyroid medicine is controlled and is helping - as we discussed  Refilled Baclofen  For Constipation (less frequent bowel movement that can be hard dry or involve straining).  Recommend trying OTC Miralax 17g = 1 capful in large glass water once daily for now, try several days to see if working, goal is soft stool or BM 1-2 times daily, if too loose then reduce dose or try every other day. If not effective may need to increase it to 2 doses at once in AM or may do 1 in morning and 1 in afternoon/evening  - This medicine is very safe and can be used often without any problem and will not make you dehydrated. It is good for use on AS NEEDED BASIS or even MAINTENANCE therapy for longer term for several days to weeks at a time to help regulate bowel movements  Other more natural remedies or preventative treatment: - Increase hydration with water - Increase fiber in diet (high fiber foods = vegetables, leafy greens, oats/grains) - May take OTC Fiber supplement (metamucil powder or pill/gummy) - May try OTC Probiotic   Please schedule a Follow-up Appointment to: Return in about 6 months (around 02/27/2018) for DM A1c, HTN, appetite, back pain.  If you have any other questions or concerns, please feel free to call the office or send a message through [place]. You may also schedule an earlier appointment if necessary.  Additionally, you may be receiving a survey about your experience at our office within a few days to  1 week by e-mail or mail. We value your feedback.  Nobie Putnam, DO [place]

## 2017-08-27 NOTE — Assessment & Plan Note (Signed)
Mild low hemoglobin with anemia in CKD, seems stable on last lab Followed by [facility] On oral iron 1-3 x daily, some constipation Follow-up with [facility] in future may consider other options if not tolerating oral iron

## 2017-08-27 NOTE — Assessment & Plan Note (Signed)
Stable Suspect hair thinning is due to age primarily Last TSH normal 08/2017 Continue current Levothyroxine 27mcg daily

## 2017-08-27 NOTE — Assessment & Plan Note (Signed)
Secondary to iron supplement most likely Limited results on dulcolax Advised to re-try miralax dosing given higher dose, use water Follow-up if not improve

## 2017-08-27 NOTE — Assessment & Plan Note (Addendum)
Relatively stable DM with A1c up to 7.3, at goal for age [age] Without significant hyperglycemia Complications - history of hypoglycemia, nephropathy with CKD III-IV, also in setting HTN - Contraindicated - Metformin / SGLT2. H/o failed Glyburide, Januvia. Not interested in injectable  Plan:  1. Recently without episodes of hypoglycemia - STOP Glipizide and SWITCH to Glimepiride 4mg  daily wc - may take HALF tab at first to see how it goes then inc if needed, likely lunch or large meal - advised switch for longer acting safer med, overall still advised risks of hypoglycemia 2. Cannot take ASA due to CKD, continue statin, on ACEi 3. Encouraged lifestyle improvements with DM diet, continue walking 4. Follow-up 6 months - will revisit sulfonylurea in future may need to discontinue

## 2017-08-27 NOTE — Progress Notes (Signed)
Subjective:    Patient ID: Lisa Mills, female    DOB: 1929-02-20, 84 y.o.   MRN: [id number]  [name] is a 84 y.o. female presenting on 08/27/2017 for Annual Exam   HPI   Here for Annual Physical and Lab Review  CHRONIC DM, Type 2: Recent lab mild elevated A1c 7.3, previously lower. CBGs: Avg120-150, Low>100 (no significant hypoglycemia), Checks CBGs1-2x daily Meds: Glipizide 2.'5mg'$  BID Reports good compliance. 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 She admits problem with thinning hair still. Taking Levothyroxine 9mg daily, tolerating well. She thinks due to age has hair problem  FOLLOW-UP Mixed anxiety/depression / Insomnia Reports that she is doing well, now on Lexapro '5mg'$  daily for past 1 year, she would like to continue, thinks it is helping her sleep, able to lay down and go to sleep easily, seems to control her anxiety and stress, primarily with being caregiver for husband [name] - Prior failed Fluoxetine  CKD-III-IV / Iron Deficiency, secondary to CKD Followed by [facility] Dr SCandiss Norse On ACEi. Was placed on iron supplement, taking 1-3 times daily, had constipation, tried dulcolax with limited results, in past failed miralax, asking about options. Her iron on recent lab was stable from previous.  Chronic Low Back Pain and Left Hip Pain / Osteoarthritis  DJD Reports recent history of onset worsening pain in April 2019, gradual worse into May 2019 and she went [facility] and they did some X-rays confirmed bone spurs and OA/DJD, she was taking Tylenol regularly with good relief but it seemed to keep bothering her, and ultimately she went back to Orthopedics in 07/2017 and dx with Sacroilitis and back pain due to OA/DJD, they considered injection and instead treated her with a Prednisone course. She did not tolerate prednisone well but overall improved - Now doing much better after prednisone course, and she doesn't have pain on a daily basis anymore, but can have flare ups still if lifting heavy groceries  - Taking Tylenol Extra Strength '500mg'$  twice daily, with some good results. She had difficulty tolerating high dose Tylenol 625 multiple times.  Health Maintenance:  Due for Flu vaccine - will return in Fall 2019  Already got Shingles vaccine Shingrix 11/2016 but due for 2nd dose - cannot locate next shot, pharmacy out of stock, may try other pharmacy  [facility] 08/27/2017 03/03/2017 12/02/2016  Decreased Interest 0 0 0  Down, Depressed, Hopeless 0 0 0  PHQ - 2 Score 0 0 0  Altered sleeping 0 0 2  Tired, decreased energy 1 0 0  Change in appetite 2 0 0  Feeling bad or failure about yourself  0 0 0  Trouble concentrating 0 0 0  Moving slowly or fidgety/restless 0 0 0  Suicidal thoughts 0 0 0  PHQ-9 Score 3 0 2  Difficult doing work/chores Not difficult at all Not difficult at all Not difficult at all    Past  Medical History:  Diagnosis Date  . Dermatophytosis of nail   . GERD (gastroesophageal reflux disease)   . History of shingles   . Hypertension   . Keratoderma    Past Surgical History:  Procedure Laterality Date  . BREAST BIOPSY Right    neg  . cataracts    . COLONOSCOPY W/ POLYPECTOMY    . COLONOSCOPY WITH PROPOFOL N/A 02/10/2017   Procedure: COLONOSCOPY WITH PROPOFOL;  Surgeon: Manya Silvas,  MD;  Location: [facility] ENDOSCOPY;  Service: Endoscopy;  Laterality: N/A;  . ESOPHAGOGASTRODUODENOSCOPY (EGD) WITH PROPOFOL N/A 02/10/2017   Procedure: ESOPHAGOGASTRODUODENOSCOPY (EGD) WITH PROPOFOL;  Surgeon: Manya Silvas, MD;  Location: [facility] ENDOSCOPY;  Service: Endoscopy;  Laterality: N/A;  . EYE SURGERY     bilateral cataracts  . HAND SURGERY    . KIDNEY STONE SURGERY    . TONSILECTOMY, ADENOIDECTOMY, BILATERAL MYRINGOTOMY AND TUBES    . TONSILLECTOMY    . TRIGGER FINGER RELEASE     Left ring finger  . tubercular peritonitis     Social History   Socioeconomic History  . Marital status: Married    Spouse name: Not on file  . Number of children: 2  . Years of education: Not on file  . Highest education level: Not on file  Occupational History  . Not on file  Social Needs  . Financial resource strain: Not on file  . Food insecurity:    Worry: Not on file    Inability: Not on file  . Transportation needs:    Medical: Not on file    Non-medical: Not on file  Tobacco Use  . Smoking status: Never Smoker  . Smokeless tobacco: Never Used  Substance and Sexual Activity  . Alcohol use: Yes    Comment: .6 oz per week  . Drug use: No  . Sexual activity: Not on file  Lifestyle  . Physical activity:    Days per week: Not on file    Minutes per session: Not on file  . Stress: Not on file  Relationships  . Social connections:    Talks on phone: Not on file    Gets together: Not on file    Attends religious service: Not on file    Active member of club or organization: Not on file    Attends meetings of clubs or organizations: Not on file    Relationship status: Not on file  . Intimate partner violence:    Fear of current or ex partner: Not on file    Emotionally abused: Not on file    Physically abused: Not on file    Forced sexual activity: Not on file  Other Topics Concern  . Not on file  Social History Narrative  . Not on file   Family History  Adopted: Yes    Current Outpatient Medications on File Prior to Visit  Medication Sig  . Biotin 1000 MCG tablet Take by mouth.  . bisacodyl (DULCOLAX) 5 MG EC tablet Take by mouth.  . Blood Glucose Monitoring Suppl ([employer]) w/Device KIT Use glucometer to check blood sugar daily.  . cholecalciferol (VITAMIN D) 1000 units tablet Take 1,000 Units by mouth daily.  Marland Kitchen escitalopram (LEXAPRO) 5 MG tablet Take 1 tablet (5 mg total) by mouth daily. With food  . glucose blood test strip Check blood sugar up to 2 x daily. Dx:E11.9  . levothyroxine (SYNTHROID, LEVOTHROID) 25 MCG tablet TAKE 1 TABLET(25 MCG) BY MOUTH EVERY DAY 30  TO 60 MINUTES BEFORE BREAKFAST ON AN EMPTY STOMACH AND WITH A GLASS OF WATER  . lisinopril (PRINIVIL,ZESTRIL) 5 MG tablet Take 1 tablet (5 mg total) by mouth daily.  Marland Kitchen omeprazole (PRILOSEC) 20 MG capsule Take 1 capsule (20 mg total) by mouth daily.  . ondansetron (ZOFRAN-ODT) 4 MG disintegrating tablet Take by mouth.  Glory Rosebush DELICA LANCETS 13K MISC 1 each by Does not apply route 3 (three) times daily. Dx:E11.9  . ranitidine (ZANTAC) 150 MG tablet Take 1 tablet (150 mg total) by mouth as needed.  . simvastatin (ZOCOR) 40 MG tablet Take 1 tablet (40 mg total) by mouth at bedtime.  . valACYclovir (VALTREX) 1000 MG tablet Take 1,000 mg by mouth 2 (two) times daily.  . vitamin E 400 UNIT capsule Take by mouth.  . Wheat Dextrin (BENEFIBER) POWD Take by mouth.  . linaclotide (LINZESS) 145 MCG CAPS capsule Take by mouth.   No current facility-administered medications on file prior to visit.     Review of Systems  Constitutional: Negative for activity change, appetite change, chills, diaphoresis, fatigue and fever.  HENT: Negative for congestion and hearing loss.   Eyes: Negative for visual disturbance.  Respiratory: Negative for apnea, cough, choking, chest tightness, shortness of breath and wheezing.   Cardiovascular: Negative for chest pain, palpitations and leg swelling.   Gastrointestinal: Negative for abdominal pain, anal bleeding, blood in stool, constipation, diarrhea, nausea and vomiting.  Endocrine: Negative for cold intolerance.  Genitourinary: Negative for dysuria, frequency and hematuria.  Musculoskeletal: Positive for arthralgias and back pain (not actively, seems improved now). Negative for neck pain.  Skin: Negative for rash.  Allergic/Immunologic: Negative for environmental allergies.  Neurological: Negative for dizziness, weakness, light-headedness, numbness and headaches.  Hematological: Negative for adenopathy.  Psychiatric/Behavioral: Negative for behavioral problems, dysphoric mood (improved) and sleep disturbance (improved). The patient is not nervous/anxious.    Per HPI unless specifically indicated above     Objective:    BP (!) 114/59   Pulse 64   Temp 98.6 F (37 C) (Oral)   Resp 16   Ht '4\' 11"'$  (1.499 m)   Wt 129 lb 9.6 oz (58.8 kg)   BMI 26.18 kg/m   Wt Readings from Last 3 Encounters:  08/27/17 129 lb 9.6 oz (58.8 kg)  03/03/17 122 lb (55.3 kg)  02/10/17 120 lb (54.4 kg)    Physical Exam  Constitutional: She is oriented to person, place, and time. She appears well-developed and well-nourished. No distress.  Well-appearing 84 yr old female, comfortable, cooperative  HENT:  Head: Normocephalic and atraumatic.  Mouth/Throat: Oropharynx is clear and moist.  Eyes: Pupils are equal, round, and reactive to light. Conjunctivae and EOM are normal. Right eye exhibits no discharge. Left eye exhibits no discharge.  Neck: Normal range of motion. Neck supple. No thyromegaly present.  Cardiovascular: Normal rate, regular rhythm, normal heart sounds and intact distal pulses.  No murmur heard. Pulmonary/Chest: Effort normal and breath sounds normal. No respiratory distress. She has no wheezes. She has no rales.  Abdominal: Soft. Bowel sounds are normal. She exhibits no distension and no mass. There is no tenderness.  Musculoskeletal:  Normal range of motion. She exhibits no edema or tenderness.  Upper / Lower Extremities: - Normal muscle tone, strength bilateral upper extremities 5/5, lower extremities 5/5  Lymphadenopathy:    She has no cervical adenopathy.  Neurological: She is alert and oriented to person, place, and time.  Distal sensation intact to light touch all extremities  Skin: Skin is warm and dry. No rash noted. She is not diaphoretic. No erythema.  Psychiatric: She has a normal mood and affect. Her behavior is normal.  Well groomed, good eye contact, normal speech and thoughts  Nursing note and vitals reviewed.    Diabetic Foot Exam - Simple   Simple Foot Form Diabetic Foot exam was performed with the following findings:  Yes 08/27/2017 10:41 AM  Visual Inspection No deformities, no ulcerations, no other skin breakdown bilaterally:  Yes Sensation Testing Intact to touch and monofilament testing bilaterally:  Yes Pulse Check Posterior Tibialis and Dorsalis pulse intact bilaterally:  Yes Comments     Results for orders placed or performed in visit on 08/20/17  T4, free  Result Value Ref Range   Free T4 1.1 0.8 - 1.8 ng/dL  TSH  Result Value Ref Range   TSH 2.95 0.40 - 4.50 mIU/L  Hemoglobin A1c  Result Value Ref Range   Hgb A1c MFr Bld 7.3 (H) <5.7 % of total Hgb   Mean Plasma Glucose 163 (calc)   eAG (mmol/L) 9.0 (calc)  CBC with Differential/Platelet  Result Value Ref Range   WBC 6.4 3.8 - 10.8 Thousand/uL   RBC 3.58 (L) 3.80 - 5.10 Million/uL   Hemoglobin 11.0 (L) 11.7 - 15.5 g/dL   HCT 33.5 (L) 35.0 - 45.0 %   MCV 93.6 80.0 - 100.0 fL   MCH 30.7 27.0 - 33.0 pg   MCHC 32.8 32.0 - 36.0 g/dL   RDW 12.9 11.0 - 15.0 %   Platelets 248 140 - 400 Thousand/uL   MPV 9.5 7.5 - 12.5 fL   Neutro Abs 3,181 1,500 - 7,800 cells/uL   Lymphs Abs 2,413 850 - 3,900 cells/uL   WBC mixed population 582 200 - 950 cells/uL   Eosinophils Absolute 173 15 - 500 cells/uL   Basophils Absolute 51 0 - 200  cells/uL   Neutrophils Relative % 49.7 %   Total Lymphocyte 37.7 %   Monocytes Relative 9.1 %   Eosinophils Relative 2.7 %   Basophils Relative 0.8 %  Lipid panel  Result Value Ref Range   Cholesterol 208 (H) <200 mg/dL   HDL 62 >50 mg/dL   Triglycerides 168 (H) <150 mg/dL   LDL Cholesterol (Calc) 118 (H) mg/dL (calc)   Total CHOL/HDL Ratio 3.4 <5.0 (calc)   Non-HDL Cholesterol (Calc) 146 (H) <130 mg/dL (calc)  COMPLETE METABOLIC PANEL WITH GFR  Result Value Ref Range   Glucose, Bld 104 (H) 65 - 99 mg/dL   BUN 24 7 - 25 mg/dL   Creat 1.56 (H) 0.60 - 0.88 mg/dL   GFR, Est Non African American 30 (L) > OR = 60 mL/min/1.56m   GFR, Est African American 35 (L) > OR = 60 mL/min/1.762m  BUN/Creatinine Ratio 15 6 - 22 (calc)   Sodium 141 135 - 146 mmol/L   Potassium 4.6 3.5 - 5.3 mmol/L   Chloride 105 98 - 110 mmol/L   CO2 28 20 - 32 mmol/L   Calcium 9.5 8.6 - 10.4 mg/dL   Total Protein 6.3 6.1 - 8.1 g/dL   Albumin 4.1 3.6 - 5.1 g/dL   Globulin 2.2 1.9 - 3.7 g/dL (calc)   AG Ratio 1.9 1.0 - 2.5 (calc)   Total Bilirubin 0.5 0.2 - 1.2 mg/dL   Alkaline phosphatase (APISO) 31 (L) 33 - 130 U/L   AST 14 10 - 35 U/L   ALT 8 6 - 29 U/L  Assessment & Plan:   Problem List Items Addressed This Visit    Anemia in chronic kidney disease (CKD)    Mild low hemoglobin with anemia in CKD, seems stable on last lab Followed by [facility] On oral iron 1-3 x daily, some constipation Follow-up with [facility] in future may consider other options if not tolerating oral iron      Anxiety associated with depression    Clinically stable and controlled mood/anxiety/insomnia on SSRI Continue Escitalopram '5mg'$  daily - unlikely to be causing some side effects she is concerned about Follow-up      CKD stage 3 due to type 2 diabetes mellitus ([place])    Relatively CKD-III, secondary to HTN DM age Followed by Nephrology [facility] Dr Candiss Norse  Plan: 1. Continue ACEi Lisinopril low dose '5mg'$  Improve  hydration Follow-up      Relevant Medications   glimepiride (AMARYL) 4 MG tablet   Constipation    Secondary to iron supplement most likely Limited results on dulcolax Advised to re-try miralax dosing given higher dose, use water Follow-up if not improve      Controlled type 2 diabetes mellitus with diabetic nephropathy ([place])    Relatively stable DM with A1c up to 7.3, at goal for age 53-8 Without significant hyperglycemia Complications - history of hypoglycemia, nephropathy with CKD III-IV, also in setting HTN - Contraindicated - Metformin / SGLT2. H/o failed Glyburide, Januvia. Not interested in injectable  Plan:  1. Recently without episodes of hypoglycemia - STOP Glipizide and SWITCH to Glimepiride '4mg'$  daily wc - may take HALF tab at first to see how it goes then inc if needed, likely lunch or large meal - advised switch for longer acting safer med, overall still advised risks of hypoglycemia 2. Cannot take ASA due to CKD, continue statin, on ACEi 3. Encouraged lifestyle improvements with DM diet, continue walking 4. Follow-up 6 months - will revisit sulfonylurea in future may need to discontinue      Relevant Medications   glimepiride (AMARYL) 4 MG tablet   DJD (degenerative joint disease) of cervical spine   Relevant Medications   baclofen (LIORESAL) 10 MG tablet   Hyperlipidemia associated with type 2 diabetes mellitus ([place])    Mostly controlled cholesterol on statin and lifestyle Last lipid panel 08/2017  Plan: 1. Continue current meds - Simvastatin '40mg'$  daily - no change today 2. Encourage improved lifestyle - low carb/cholesterol, reduce portion size, continue improving regular exercise      Relevant Medications   glimepiride (AMARYL) 4 MG tablet   Hypothyroidism    Stable Suspect hair thinning is due to age primarily Last TSH normal 08/2017 Continue current Levothyroxine 27mg daily      Osteoarthritis of multiple joints    Underlying etiology for chronic  problems with OA/DJD multiple joints primarily back lumbar Followed by [facility] improved on prednisone taper Continue Tylenol regular dosing, activity modification, conservative care Follow-up in future if not improve or return to ortho consider injection      Relevant Medications   baclofen (LIORESAL) 10 MG tablet    Other Visit Diagnoses    Annual physical exam    -  Primary     Updated Health Maintenance information - Need copy of DM Eye exam Reviewed recent lab results with patient Encouraged improvement to lifestyle with diet and exercise   Meds ordered this encounter  Medications  . baclofen (LIORESAL) 10 MG tablet    Sig: Take 0.5-1 tablets (5-10 mg total) by mouth at bedtime  as needed for muscle spasms.    Dispense:  90 each    Refill:  3  . glimepiride (AMARYL) 4 MG tablet    Sig: Take 0.5-1 tablets (2-4 mg total) by mouth daily before breakfast.    Dispense:  90 tablet    Refill:  3  . polyethylene glycol powder (MIRALAX) powder    Sig: Take 17-34 g by mouth daily as needed.    Dispense:  255 g    Refill:  0    Follow up plan: Return in about 6 months (around 02/27/2018) for DM A1c, HTN, appetite, back pain.  Nobie Putnam, DO [facility] 08/27/2017, 1:25 PM

## 2017-08-27 NOTE — Assessment & Plan Note (Signed)
Underlying etiology for chronic problems with OA/DJD multiple joints primarily back lumbar Followed by [facility], improved on prednisone taper Continue Tylenol regular dosing, activity modification, conservative care Follow-up in future if not improve or return to ortho consider injection

## 2017-08-27 NOTE — Assessment & Plan Note (Addendum)
Clinically stable and controlled mood/anxiety/insomnia on SSRI Continue Escitalopram 5mg  daily - unlikely to be causing some side effects she is concerned about Follow-up

## 2017-09-03 ENCOUNTER — Ambulatory Visit
Admission: RE | Admit: 2017-09-03 | Discharge: 2017-09-03 | Disposition: A | Discharge status: Home / Self Care | Payer: PPO | Source: Ambulatory Visit | Attending: Family Medicine | Admitting: Family Medicine

## 2017-09-03 DIAGNOSIS — Z1231 Encounter for screening mammogram for malignant neoplasm of breast: Secondary | ICD-10-CM | POA: Insufficient documentation

## 2017-09-06 ENCOUNTER — Other Ambulatory Visit: Payer: Self-pay | Admitting: Family Medicine

## 2017-09-06 DIAGNOSIS — R928 Other abnormal and inconclusive findings on diagnostic imaging of breast: Secondary | ICD-10-CM

## 2017-09-13 ENCOUNTER — Ambulatory Visit
Admission: RE | Admit: 2017-09-13 | Discharge: 2017-09-13 | Disposition: A | Discharge status: Home / Self Care | Payer: PPO | Source: Ambulatory Visit | Attending: Family Medicine | Admitting: Family Medicine

## 2017-09-13 ENCOUNTER — Other Ambulatory Visit: Payer: Self-pay | Admitting: Family Medicine

## 2017-09-13 DIAGNOSIS — R928 Other abnormal and inconclusive findings on diagnostic imaging of breast: Secondary | ICD-10-CM

## 2017-09-13 DIAGNOSIS — R921 Mammographic calcification found on diagnostic imaging of breast: Secondary | ICD-10-CM | POA: Diagnosis not present

## 2017-09-15 ENCOUNTER — Other Ambulatory Visit: Payer: Self-pay | Admitting: Family Medicine

## 2017-09-15 DIAGNOSIS — E038 Other specified hypothyroidism: Secondary | ICD-10-CM

## 2017-09-15 DIAGNOSIS — E063 Autoimmune thyroiditis: Principal | ICD-10-CM

## 2017-09-21 ENCOUNTER — Ambulatory Visit: Payer: PPO

## 2017-09-23 DIAGNOSIS — E119 Type 2 diabetes mellitus without complications: Secondary | ICD-10-CM | POA: Diagnosis not present

## 2017-09-23 LAB — HM DIABETES EYE EXAM

## 2017-09-29 DIAGNOSIS — N183 Chronic kidney disease, stage 3 (moderate): Secondary | ICD-10-CM | POA: Diagnosis not present

## 2017-09-29 DIAGNOSIS — I129 Hypertensive chronic kidney disease with stage 1 through stage 4 chronic kidney disease, or unspecified chronic kidney disease: Secondary | ICD-10-CM | POA: Diagnosis not present

## 2017-09-29 DIAGNOSIS — E1129 Type 2 diabetes mellitus with other diabetic kidney complication: Secondary | ICD-10-CM | POA: Diagnosis not present

## 2017-09-30 ENCOUNTER — Ambulatory Visit
Admission: RE | Admit: 2017-09-30 | Discharge: 2017-09-30 | Disposition: A | Discharge status: Home / Self Care | Payer: PPO | Source: Ambulatory Visit | Attending: Family Medicine | Admitting: Family Medicine

## 2017-09-30 ENCOUNTER — Encounter: Payer: Self-pay | Admitting: Family Medicine

## 2017-09-30 DIAGNOSIS — R921 Mammographic calcification found on diagnostic imaging of breast: Secondary | ICD-10-CM

## 2017-09-30 DIAGNOSIS — R928 Other abnormal and inconclusive findings on diagnostic imaging of breast: Secondary | ICD-10-CM

## 2017-09-30 HISTORY — PX: BREAST BIOPSY: SHX20

## 2017-10-01 LAB — SURGICAL PATHOLOGY

## 2017-10-06 DIAGNOSIS — L578 Other skin changes due to chronic exposure to nonionizing radiation: Secondary | ICD-10-CM | POA: Diagnosis not present

## 2017-10-06 DIAGNOSIS — D229 Melanocytic nevi, unspecified: Secondary | ICD-10-CM | POA: Diagnosis not present

## 2017-10-06 DIAGNOSIS — Z85828 Personal history of other malignant neoplasm of skin: Secondary | ICD-10-CM | POA: Diagnosis not present

## 2017-10-06 DIAGNOSIS — L821 Other seborrheic keratosis: Secondary | ICD-10-CM | POA: Diagnosis not present

## 2017-10-06 DIAGNOSIS — Z1283 Encounter for screening for malignant neoplasm of skin: Secondary | ICD-10-CM | POA: Diagnosis not present

## 2017-10-06 DIAGNOSIS — D692 Other nonthrombocytopenic purpura: Secondary | ICD-10-CM | POA: Diagnosis not present

## 2017-10-17 DIAGNOSIS — I10 Essential (primary) hypertension: Secondary | ICD-10-CM | POA: Diagnosis not present

## 2017-10-17 DIAGNOSIS — R42 Dizziness and giddiness: Secondary | ICD-10-CM | POA: Diagnosis not present

## 2017-10-19 ENCOUNTER — Ambulatory Visit (INDEPENDENT_AMBULATORY_CARE_PROVIDER_SITE_OTHER): Payer: PPO

## 2017-10-19 VITALS — BP 118/60 | HR 72 | Temp 98.6°F | Resp 16 | Ht 59.0 in | Wt 130.0 lb

## 2017-10-19 DIAGNOSIS — Z23 Encounter for immunization: Secondary | ICD-10-CM

## 2017-10-19 DIAGNOSIS — Z0000 Encounter for general adult medical examination without abnormal findings: Secondary | ICD-10-CM | POA: Diagnosis not present

## 2017-10-19 NOTE — Patient Instructions (Addendum)
Lisa Mills , Thank you for taking time to come for your Medicare Wellness Visit. I appreciate your ongoing commitment to your health goals. Please review the following plan we discussed and let me know if I can assist you in the future.   Screening recommendations/referrals: Colonoscopy: completed 02/10/2017 no longer required Mammogram: completed 09/30/2017 Bone Density: completed 09/02/2016 Recommended yearly ophthalmology/optometry visit for glaucoma screening and checkup Recommended yearly dental visit for hygiene and checkup  Vaccinations: Influenza vaccine: done today  Pneumococcal vaccine: completed series Tdap vaccine: completed 09/26/2010 Shingles vaccine: completed shingrix series  Advanced directives: copy on file   Conditions/risks identified: Recommend drinking at least 6-8 glasses of water a day   Next appointment: Follow up in one year for your annual wellness exam.    Preventive Care 82 Years and Older, Female Preventive care refers to lifestyle choices and visits with your health care provider that can promote health and wellness. What does preventive care include?  A yearly physical exam. This is also called an annual well check.  Dental exams once or twice a year.  Routine eye exams. Ask your health care provider how often you should have your eyes checked.  Personal lifestyle choices, including:  Daily care of your teeth and gums.  Regular physical activity.  Eating a healthy diet.  Avoiding tobacco and drug use.  Limiting alcohol use.  Practicing safe sex.  Taking low-dose aspirin every day.  Taking vitamin and mineral supplements as recommended by your health care provider. What happens during an annual well check? The services and screenings done by your health care provider during your annual well check will depend on your age, overall health, lifestyle risk factors, and family history of disease. Counseling  Your health care provider may ask  you questions about your:  Alcohol use.  Tobacco use.  Drug use.  Emotional well-being.  Home and relationship well-being.  Sexual activity.  Eating habits.  History of falls.  Memory and ability to understand (cognition).  Work and work Statistician.  Reproductive health. Screening  You may have the following tests or measurements:  Height, weight, and BMI.  Blood pressure.  Lipid and cholesterol levels. These may be checked every 5 years, or more frequently if you are over 59 years old.  Skin check.  Lung cancer screening. You may have this screening every year starting at age 35 if you have a 30-pack-year history of smoking and currently smoke or have quit within the past 15 years.  Fecal occult blood test (FOBT) of the stool. You may have this test every year starting at age 10.  Flexible sigmoidoscopy or colonoscopy. You may have a sigmoidoscopy every 5 years or a colonoscopy every 10 years starting at age 24.  Hepatitis C blood test.  Hepatitis B blood test.  Sexually transmitted disease (STD) testing.  Diabetes screening. This is done by checking your blood sugar (glucose) after you have not eaten for a while (fasting). You may have this done every 1-3 years.  Bone density scan. This is done to screen for osteoporosis. You may have this done starting at age 27.  Mammogram. This may be done every 1-2 years. Talk to your health care provider about how often you should have regular mammograms. Talk with your health care provider about your test results, treatment options, and if necessary, the need for more tests. Vaccines  Your health care provider may recommend certain vaccines, such as:  Influenza vaccine. This is recommended every year.  Tetanus,  diphtheria, and acellular pertussis (Tdap, Td) vaccine. You may need a Td booster every 10 years.  Zoster vaccine. You may need this after age 15.  Pneumococcal 13-valent conjugate (PCV13) vaccine. One dose  is recommended after age 44.  Pneumococcal polysaccharide (PPSV23) vaccine. One dose is recommended after age 75. Talk to your health care provider about which screenings and vaccines you need and how often you need them. This information is not intended to replace advice given to you by your health care provider. Make sure you discuss any questions you have with your health care provider. Document Released: 02/01/2015 Document Revised: 09/25/2015 Document Reviewed: 11/06/2014 Elsevier Interactive Patient Education  2017 [place] Prevention in the Home Falls can cause injuries. They can happen to people of all ages. There are many things you can do to make your home safe and to help prevent falls. What can I do on the outside of my home?  Regularly fix the edges of walkways and driveways and fix any cracks.  Remove anything that might make you trip as you walk through a door, such as a raised step or threshold.  Trim any bushes or trees on the path to your home.  Use bright outdoor lighting.  Clear any walking paths of anything that might make someone trip, such as rocks or tools.  Regularly check to see if handrails are loose or broken. Make sure that both sides of any steps have handrails.  Any raised decks and porches should have guardrails on the edges.  Have any leaves, snow, or ice cleared regularly.  Use sand or salt on walking paths during winter.  Clean up any spills in your garage right away. This includes oil or grease spills. What can I do in the bathroom?  Use night lights.  Install grab bars by the toilet and in the tub and shower. Do not use towel bars as grab bars.  Use non-skid mats or decals in the tub or shower.  If you need to sit down in the shower, use a plastic, non-slip stool.  Keep the floor dry. Clean up any water that spills on the floor as soon as it happens.  Remove soap buildup in the tub or shower regularly.  Attach bath mats  securely with double-sided non-slip rug tape.  Do not have throw rugs and other things on the floor that can make you trip. What can I do in the bedroom?  Use night lights.  Make sure that you have a light by your bed that is easy to reach.  Do not use any sheets or blankets that are too big for your bed. They should not hang down onto the floor.  Have a firm chair that has side arms. You can use this for support while you get dressed.  Do not have throw rugs and other things on the floor that can make you trip. What can I do in the kitchen?  Clean up any spills right away.  Avoid walking on wet floors.  Keep items that you use a lot in easy-to-reach places.  If you need to reach something above you, use a strong step stool that has a grab bar.  Keep electrical cords out of the way.  Do not use floor polish or wax that makes floors slippery. If you must use wax, use non-skid floor wax.  Do not have throw rugs and other things on the floor that can make you trip. What can I do with  my stairs?  Do not leave any items on the stairs.  Make sure that there are handrails on both sides of the stairs and use them. Fix handrails that are broken or loose. Make sure that handrails are as long as the stairways.  Check any carpeting to make sure that it is firmly attached to the stairs. Fix any carpet that is loose or worn.  Avoid having throw rugs at the top or bottom of the stairs. If you do have throw rugs, attach them to the floor with carpet tape.  Make sure that you have a light switch at the top of the stairs and the bottom of the stairs. If you do not have them, ask someone to add them for you. What else can I do to help prevent falls?  Wear shoes that:  Do not have high heels.  Have rubber bottoms.  Are comfortable and fit you well.  Are closed at the toe. Do not wear sandals.  If you use a stepladder:  Make sure that it is fully opened. Do not climb a closed  stepladder.  Make sure that both sides of the stepladder are locked into place.  Ask someone to hold it for you, if possible.  Clearly mark and make sure that you can see:  Any grab bars or handrails.  First and last steps.  Where the edge of each step is.  Use tools that help you move around (mobility aids) if they are needed. These include:  Canes.  Walkers.  Scooters.  Crutches.  Turn on the lights when you go into a dark area. Replace any light bulbs as soon as they burn out.  Set up your furniture so you have a clear path. Avoid moving your furniture around.  If any of your floors are uneven, fix them.  If there are any pets around you, be aware of where they are.  Review your medicines with your doctor. Some medicines can make you feel dizzy. This can increase your chance of falling. Ask your doctor what other things that you can do to help prevent falls. This information is not intended to replace advice given to you by your health care provider. Make sure you discuss any questions you have with your health care provider. Document Released: 11/01/2008 Document Revised: 06/13/2015 Document Reviewed: 02/09/2014 Elsevier Interactive Patient Education  2017 [employer].

## 2017-10-19 NOTE — Progress Notes (Signed)
Subjective:   Lisa Mills is a 84 y.o. female who presents for Medicare Annual (Subsequent) preventive examination.  Review of Systems:   Cardiac Risk Factors include: advanced age (>20mn, >>66women);diabetes mellitus;hypertension;dyslipidemia     Objective:     Vitals: BP 118/60 (BP Location: Right Arm, Patient Position: Sitting)   Pulse 72   Temp 98.6 F (37 C) (Oral)   Resp 16   Ht _0  (1.499 m)   Wt 130 lb (59 kg)   BMI 26.26 kg/m   Body mass index is 26.26 kg/m.  Advanced Directives 10/19/2017 02/10/2017 11/03/2016  Does Patient Have a Medical Advance Directive? Yes Yes Yes  Type of [place]amed[place]of [place] -  Copy of [place]? Yes Yes -    Tobacco Social History   Tobacco Use  Smoking Status Never Smoker  Smokeless Tobacco Never Used     Counseling given: Not Answered   Clinical Intake:  Pre-visit preparation completed: Yes  Pain : No/denies pain     Nutritional Status: BMI 25 -29 Overweight Nutritional Risks: None Diabetes: Yes CBG done?: No Did pt. bring in CBG monitor from home?: No   Nutrition Risk Assessment:  Has the patient had any N/V/D within the last 2 months?  No  Does the patient have any non-healing wounds?  No  Has the patient had any unintentional weight loss or weight gain?  No   Diabetes:  Is the patient diabetic?  Yes  If diabetic, was a CBG obtained today?  No  Did the patient bring in their glucometer from home?  No  How often do you monitor your CBG's? Once a day .   Financial Strains and Diabetes Management:  Are you having any financial strains with the device, your supplies or your medication? No .    Diabetic Exams:  Diabetic Eye Exam: Completed 09/23/2017 at [facility]   Diabetic Foot Exam: Completed 08/27/2017.   How often do you need to have someone help you when you read instructions,  pamphlets, or other written materials from your doctor or pharmacy?: 1 - Never What is the last grade level you completed in school?: some college   Interpreter Needed?: No  Information entered by :: Latanya Hemmer,lPN   Past Medical History:  Diagnosis Date  . Dermatophytosis of nail   . GERD (gastroesophageal reflux disease)   . History of shingles   . Hypertension   . Keratoderma    Past Surgical History:  Procedure Laterality Date  . BREAST BIOPSY Right    neg  . BREAST BIOPSY Right 09/30/2017   Affirm bx-calcs ( X clip)  . cataracts    . COLONOSCOPY W/ POLYPECTOMY    . COLONOSCOPY WITH PROPOFOL N/A 02/10/2017   Procedure: COLONOSCOPY WITH PROPOFOL;  Surgeon: EManya Silvas MD;  [facility]  Service: Endoscopy;  Laterality: N/A;  . ESOPHAGOGASTRODUODENOSCOPY (EGD) WITH PROPOFOL N/A 02/10/2017   Procedure: ESOPHAGOGASTRODUODENOSCOPY (EGD) WITH PROPOFOL;  Surgeon: EManya Silvas MD;  [facility];  Service: Endoscopy;  Laterality: N/A;  . EYE SURGERY     bilateral cataracts  . HAND SURGERY    . KIDNEY STONE SURGERY    . TONSILECTOMY, ADENOIDECTOMY, BILATERAL MYRINGOTOMY AND TUBES    . TONSILLECTOMY    . TRIGGER FINGER RELEASE     Left ring finger  . tubercular peritonitis     Family History  Adopted: Yes  Problem Relation Age of Onset  . Breast cancer Neg Hx    Social History   Socioeconomic History  . Marital status: Married    Spouse name: Not on file  . Number of children: 2  . Years of education: Not on file  . Highest education level: Some college, no degree  Occupational History  . Not on file  Social Needs  . Financial resource strain: Not hard at all  . Food insecurity:    Worry: Never true    Inability: Never true  . Transportation needs:    Medical: No    Non-medical: No  Tobacco Use  . Smoking status: Never Smoker  . Smokeless tobacco: Never Used  Substance and Sexual Activity  . Alcohol use: Not Currently  .  Drug use: No  . Sexual activity: Not on file  Lifestyle  . Physical activity:    Days per week: 0 days    Minutes per session: 0 min  . Stress: Not at all  Relationships  . Social connections:    Talks on phone: More than three times a week    Gets together: More than three times a week    Attends religious service: Never    Active member of club or organization: No    Attends meetings of clubs or organizations: Never    Relationship status: Married  Other Topics Concern  . Not on file  Social History Narrative  . Not on file    Outpatient Encounter Medications as of 10/19/2017  Medication Sig  . baclofen (LIORESAL) 10 MG tablet Take 0.5-1 tablets (5-10 mg total) by mouth at bedtime as needed for muscle spasms.  . Blood Glucose Monitoring Suppl ([place]) w/Device KIT Use glucometer to check blood sugar daily.  . cholecalciferol (VITAMIN D) 1000 units tablet Take 1,000 Units by mouth daily.  Marland Kitchen escitalopram (LEXAPRO) 5 MG tablet Take 1 tablet (5 mg total) by mouth daily. With food  . glimepiride (AMARYL) 4 MG tablet Take 0.5-1 tablets (2-4 mg total) by mouth daily before breakfast.  . glucose blood test strip Check blood sugar up to 2 x daily. Dx:E11.9  . levothyroxine (SYNTHROID, LEVOTHROID) 25 MCG tablet TAKE 1 TABLET BY MOUTH ONCE DAILY ON AN EMPTY STOMACH. WAIT 30 MINUTES BEFORE TAKING OTHER MEDS.  Marland Kitchen lisinopril (PRINIVIL,ZESTRIL) 5 MG tablet Take 1 tablet (5 mg total) by mouth daily.  Marland Kitchen omeprazole (PRILOSEC) 20 MG capsule Take 1 capsule (20 mg total) by mouth daily.  Glory Rosebush DELICA LANCETS 34V MISC 1 each by Does not apply route 3 (three) times daily. Dx:E11.9  . polyethylene glycol powder (MIRALAX) powder Take 17-34 g by mouth daily as needed.  . Probiotic Product (PROBIOTIC-10 PO) Take by mouth.  . simvastatin (ZOCOR) 40 MG tablet Take 1 tablet (40 mg total) by mouth at bedtime.  . Wheat Dextrin (BENEFIBER) POWD Take by mouth.  . Biotin 1000 MCG tablet Take  by mouth.  . bisacodyl (DULCOLAX) 5 MG EC tablet Take by mouth.  . linaclotide (LINZESS) 145 MCG CAPS capsule Take by mouth.  . ondansetron (ZOFRAN-ODT) 4 MG disintegrating tablet Take by mouth.  . ranitidine (ZANTAC) 150 MG tablet Take 1 tablet (150 mg total) by mouth as needed. (Patient not taking: Reported on 10/19/2017)  . valACYclovir (VALTREX) 1000 MG tablet Take 1,000 mg by mouth 2 (two) times daily.  . vitamin E 400 UNIT capsule Take by mouth.   No facility-administered encounter medications on file as of  10/19/2017.     Activities of Daily Living In your present state of health, do you have any difficulty performing the following activities: 10/19/2017  Hearing? Y  Comment hearing aids   Vision? N  Difficulty concentrating or making decisions? N  Walking or climbing stairs? N  Dressing or bathing? N  Doing errands, shopping? N  Preparing Food and eating ? N  Using the Toilet? N  In the past six months, have you accidently leaked urine? Y  Comment wears pads for protection   Do you have problems with loss of bowel control? Y  Comment wears pads for protection   Managing your Medications? N  Managing your Finances? N  Housekeeping or managing your Housekeeping? N  Some recent data might be hidden    Patient Care Team: Olin Hauser, DO as PCP - General (Family Medicine)    Assessment:   This is a routine wellness examination for [facility].  Exercise Activities and Dietary recommendations Current Exercise Habits: Home exercise routine, Type of exercise: walking, Time (Minutes): 20, Frequency (Times/Week): 7, Weekly Exercise (Minutes/Week): 140, Intensity: Mild, Exercise limited by: None identified  Goals    . DIET - INCREASE WATER INTAKE     Recommend drinking at least 6-8 glasses of water a day        Fall Risk Fall Risk  10/19/2017 03/03/2017 12/01/2016 11/03/2016 02/25/2016  Falls in the past year? _0    FALL RISK PREVENTION PERTAINING TO THE  HOME:  Any stairs in or around the home WITH handrails? Yes  Home free of loose throw rugs in walkways, pet beds, electrical cords, etc? Yes  Adequate lighting in your home to reduce risk of falls? Yes   ASSISTIVE DEVICES UTILIZED TO PREVENT FALLS:  Life alert? No  Use of a cane, walker or w/c? No  Grab bars in the bathroom? Yes  Shower chair or bench in shower? Yes  Elevated toilet seat or a handicapped toilet? No   DME ORDERS:  DME order needed?  No   TIMED UP AND GO:  Was the test performed? Yes .  Length of time to ambulate 10 feet: 8 sec.   GAIT:  Appearance of gait: Gait steady-fast  without the use of an assistive device   Depression Screen PHQ 2/9 Scores 10/19/2017 08/27/2017 03/03/2017 12/02/2016  PHQ - 2 Score 0 0 0 0  PHQ- 9 Score - 3 0 2     Cognitive Function     6CIT Screen 10/19/2017  What Year? 0 points  What month? 0 points  What time? 0 points  Count back from 20 0 points  Months in reverse 0 points  Repeat phrase 0 points  Total Score 0    Immunization History  Administered Date(s) Administered  . Influenza, High Dose Seasonal PF 10/28/2016, 10/19/2017  . Influenza-Unspecified 10/17/2011, 11/09/2012, 10/30/2013, 11/02/2014, 11/10/2015  . Pneumococcal Conjugate-13 12/10/2012  . Pneumococcal Polysaccharide-23 08/25/2016  . Zoster Recombinat (Shingrix) 12/11/2016, 08/28/2017    Qualifies for Shingles Vaccine? Yes  shingrix completed 12/11/2016 and 08/28/2017  Tdap: completed 09/26/2010  Flu Vaccine: Due for Flu vaccine. Does the patient want to receive this vaccine today?  Yes . Completed today   Pneumococcal Vaccine: completed series  Screening Tests Health Maintenance  Topic Date Due  . INFLUENZA VACCINE  08/19/2017  . HEMOGLOBIN A1C  02/20/2018  . FOOT EXAM  08/28/2018  . OPHTHALMOLOGY EXAM  09/24/2018  . TETANUS/TDAP  09/25/2020  . DEXA SCAN  Completed  . PNA vac Low Risk Adult  Completed    Cancer Screenings:  Colorectal  Screening: Completed 02/10/2017. No longer required  Mammogram: Completed 09/30/2017.no longer required  Bone Density: Completed 09/02/2016. No longer required   Lung Cancer Screening: (Low Dose CT Chest recommended if Age 25-80 years, 30 pack-year currently smoking OR have quit w/in 15years.) does not qualify.     Additional Screening:  Hepatitis C Screening: does not qualify   Dental Screening: Recommended annual dental exams for proper oral hygiene  Community Resource Referral:  CRR required this visit?  No      Plan:    I have personally reviewed and addressed the Medicare Annual Wellness questionnaire and have noted the following in the patient's chart:  A. Medical and social history B. Use of alcohol, tobacco or illicit drugs  C. Current medications and supplements D. Functional ability and status E.  Nutritional status F.  Physical activity G. Advance directives H. List of other physicians I.  Hospitalizations, surgeries, and ER visits in previous 12 months J.  [place] such as hearing and vision if needed, cognitive and depression L. Referrals and appointments   In addition, I have reviewed and discussed with patient certain preventive protocols, quality metrics, and best practice recommendations. A written personalized care plan for preventive services as well as general preventive health recommendations were provided to patient.   Signed,  Tyler Aas, LPN Nurse Health Advisor   Nurse Notes: patient states she stopped her zantac due to the recalls, per Cassell Smiles, NP patient can take pepcid as needed on top of her Prilosec. Patient verbalized understanding but inquired if she needed to try Prilosec twice a day because she was on both due to a gastric ulcer. Is also requesting if she should not have Prilosec BID then can the pepcid be sent in to her pharmacy. informed her someone from the office would call to verify this within the next week.

## 2017-10-21 ENCOUNTER — Other Ambulatory Visit: Payer: Self-pay | Admitting: Family Medicine

## 2017-10-21 DIAGNOSIS — K219 Gastro-esophageal reflux disease without esophagitis: Secondary | ICD-10-CM

## 2017-10-21 MED ORDER — FAMOTIDINE 20 MG PO TABS: 20.0000 mg | ORAL_TABLET | Freq: Every day | ORAL | 1 refills | Status: DC

## 2017-11-19 ENCOUNTER — Telehealth: Payer: Self-pay | Admitting: Family Medicine

## 2017-11-19 ENCOUNTER — Other Ambulatory Visit: Payer: Self-pay | Admitting: Family Medicine

## 2017-11-19 DIAGNOSIS — F418 Other specified anxiety disorders: Secondary | ICD-10-CM

## 2017-11-19 MED ORDER — ESCITALOPRAM OXALATE 5 MG PO TABS: 5.0000 mg | ORAL_TABLET | Freq: Every day | ORAL | 3 refills | Status: DC

## 2017-11-19 NOTE — Telephone Encounter (Signed)
Pt needs a refill on lexapro sent to [facility].

## 2017-11-19 NOTE — Telephone Encounter (Signed)
Refilled  Nobie Putnam, DO [facility] 11/19/2017, 1:53 PM

## 2018-01-26 ENCOUNTER — Other Ambulatory Visit: Payer: Self-pay | Admitting: Family Medicine

## 2018-01-26 DIAGNOSIS — K219 Gastro-esophageal reflux disease without esophagitis: Secondary | ICD-10-CM

## 2018-02-11 ENCOUNTER — Other Ambulatory Visit: Payer: Self-pay | Admitting: Pharmacist

## 2018-02-11 NOTE — Patient Outreach (Signed)
[facility]) Care Management  02/11/2018  Lisa Mills June 28, 1929 [id number]  Referral Reason: Medication Management  Referral Source: [employer]  Patient failed statin, ACEi/ARB, and diabetes adherence measures in 2019, determining whether [place] intervention would provide benefit for adherence in 2020.   Per chart review, patient was not consistently taking sulfonylurea therapy due to hypoglycemia, and was switched from glipizide to glimepiride midway through the year, which likely caused her diabetes medication fill history to be off. PCP office visit notes also mention that she was not taking ACEi therapy in the beginning of the year, possibly related to her renal disease.   Contacted patient, left HIPAA compliant message for her to call me back at her convenience.   Catie Darnelle Maffucci, PharmD PGY2 Ambulatory Care Pharmacy Resident, [facility] Phone: [phone]

## 2018-02-15 ENCOUNTER — Other Ambulatory Visit: Payer: Self-pay | Admitting: Family Medicine

## 2018-02-15 ENCOUNTER — Other Ambulatory Visit: Payer: Self-pay | Admitting: Neurology

## 2018-02-15 DIAGNOSIS — R921 Mammographic calcification found on diagnostic imaging of breast: Secondary | ICD-10-CM

## 2018-02-16 ENCOUNTER — Other Ambulatory Visit: Payer: Self-pay | Admitting: Pharmacist

## 2018-02-16 NOTE — Patient Outreach (Signed)
[facility]) Care Management [place]   02/16/2018  Lisa Mills Dec 22, 1929 [id number]  Referral Reason: Medication Management  Referral Source: [employer]  Patient failed statin, ACEi/ARB, and diabetes adherence measures in 2019, determining whether [place] intervention would provide benefit for adherence in 2020.   Contacted patient. She was very unwilling to discuss her medication history with me over the phone, but we did have the opportunity to discuss lisinopril and simvastatin therapy. 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 She endorses using a pill box, but that she still forgets. She was unwilling to discuss other adherence strategies with me today.    Diabetes:  Per chart review, patient was prescribed glipizide 5 mg BID on 02/25/2016, but this dose was decreased to 2.5 mg daily on 05/24/2016. Per [employer] fill history review, the last time glipizide was filled was for 5 mg BID (180 tablets for a 90 day supply) on 01/20/2017. It appears that the older glipizide prescription was never canceled, so 180 glipizide 5 mg tablets would last 360 days, if she was only taking 1/2 tab daily, explaining the fill history. Additionally, she was switched to glimepiride on 08/27/2017 - glimepiride 4 mg tablets, take 1/2-1 tablet by mouth once daily. The pharmacy appropriately ran 90 tablets for a 90 day supply, but if the patient was only taking 1/2 tablet a day, this supply would actually last 180 days, explaining the fill history. Would be appropriate to continue to evaluate risk vs benefit of sulfonylurea therapy and risk of hypoglycemia moving forward, as given patient's age, strict A1c control is likely not necessary.   Hypertension:  Per chart review, antihypertensive therapy was held in this patient d/t  dizziness. Per pharmacy, the medication was filled 02/18/2017 for 90 tablets on insurance, 09/16/2017 for 90 tablets on insurance, and 01/14/2018 for 90 tablets on insurance. Per chart review, she did have dizziness with lisinopril, so therapy was held until 03/03/2017 when it was restarted. Likely that the "forgetfulnes" reported to me by the patient explains the nonadherence. However, BP has remained well controlled at office visits in the past year since lisinopril reinitiation.   Hyperlipidemia:  Per pharmacy, simvastatin was filled 03/18/2017, 06/21/2017, and 10/20/2017 for 90 day supplies each. Patient reports that she often misses this medication d/t it having to be taken at night. Last LDL was moderately elevated at 118 (08/20/2017), though patient does not have a hx ASCVD and is of an advanced age.    Patient denied any questions or concerns regarding her medications, noting that she sees PCP Dr. Parks Ranger next week. Will route note to PCP for FYI. If it is determined that further [place] intervention is needed/desired to help improve medication adherence, I would be happy to help. Could consider changing patient to a different statin medication that does not need to be dosed in the evenings if she believes this would improve adherence (eg. Atorvastatin and rosuvastatin have longer half lives and are still efficacious, even if dosed in the morning)   Catie Darnelle Maffucci, PharmD PGY2 Ambulatory Care Pharmacy Resident, [facility] Phone: [phone]

## 2018-02-28 ENCOUNTER — Ambulatory Visit: Payer: PPO | Admitting: Family Medicine

## 2018-03-01 DIAGNOSIS — H9190 Unspecified hearing loss, unspecified ear: Secondary | ICD-10-CM | POA: Diagnosis not present

## 2018-03-01 DIAGNOSIS — H6123 Impacted cerumen, bilateral: Secondary | ICD-10-CM | POA: Diagnosis not present

## 2018-03-02 ENCOUNTER — Ambulatory Visit (INDEPENDENT_AMBULATORY_CARE_PROVIDER_SITE_OTHER): Payer: PPO | Admitting: Family Medicine

## 2018-03-02 ENCOUNTER — Encounter: Payer: Self-pay | Admitting: Family Medicine

## 2018-03-02 ENCOUNTER — Other Ambulatory Visit: Payer: Self-pay

## 2018-03-02 VITALS — BP 106/56 | HR 67 | Temp 98.5°F | Resp 16 | Ht 59.0 in | Wt 133.6 lb

## 2018-03-02 DIAGNOSIS — E11649 Type 2 diabetes mellitus with hypoglycemia without coma: Secondary | ICD-10-CM

## 2018-03-02 DIAGNOSIS — E1121 Type 2 diabetes mellitus with diabetic nephropathy: Secondary | ICD-10-CM | POA: Diagnosis not present

## 2018-03-02 DIAGNOSIS — I10 Essential (primary) hypertension: Secondary | ICD-10-CM | POA: Diagnosis not present

## 2018-03-02 DIAGNOSIS — E1122 Type 2 diabetes mellitus with diabetic chronic kidney disease: Secondary | ICD-10-CM | POA: Diagnosis not present

## 2018-03-02 DIAGNOSIS — N183 Chronic kidney disease, stage 3 unspecified: Secondary | ICD-10-CM

## 2018-03-02 DIAGNOSIS — F418 Other specified anxiety disorders: Secondary | ICD-10-CM

## 2018-03-02 LAB — POCT GLYCOSYLATED HEMOGLOBIN (HGB A1C): HEMOGLOBIN A1C: 6.9 % — AB (ref 4.0–5.6)

## 2018-03-02 NOTE — Assessment & Plan Note (Signed)
Improved DM control now A1c 6.9, from prior >7 Concern with hypoglycemia now as complication - symptomatic in CBG 80-90s, suspect secondary to sulfonylurea in 85 yr old patient Complications - hypoglycemia, nephropathy with CKD III-IV, also in setting HTN - Contraindicated - SGLT2. H/o failed Glyburide, Januvia. Not interested in injectable - Off metformin in past due to CKD  Plan:  1. Now STOP Glimepiride 2-4mg  daily - remain OFF medication. Precaution on sulfonylurea. Advised that she may use HALF tablet of Glimepiride for 2mg  dose ONLY PRN if CBG >200-250 if eat out or other concerns, but use this very rarely. - Consider 2nd option Metformin very low dose 250 or 500 daily only if needed if elevated CBG while off sulfonylurea, limited other med options 2. Cannot take ASA due to CKD, continue statin, on ACEi 3. Encouraged lifestyle improvements with DM diet, continue walking 4. Follow-up 3 months - for DM A1c med adjust  CALL office in 1 month for CBG update and med information - if need we can send metformin low dose at that time.

## 2018-03-02 NOTE — Assessment & Plan Note (Signed)
Clinically seems mostly stable with anxiety and mood, but worsening caregiver burden stress  Plan - Discussed likely still benefiting from Escitalopram 5mg  daily, agree to keep taking this for now, instead I think some of her tremors and anxiety like symptoms may be more physical from the sulfonylurea - see A&P - Offered additional support with her husband Linna Hoff - handout given for [facility] and she can check them out as an option to have consultation help evaluate Dan for symptom, med management, and goals of care - in future if he needs more Home Health assistance or Home Aide she can notify us as well. She will request referral to palliative if interested - Follow-up in 3 months

## 2018-03-02 NOTE — Progress Notes (Signed)
Subjective:    Patient ID: Lisa Mills, female    DOB: Mar 09, 1929, 85 y.o.   MRN: [id number]  Lisa Mills is a 85 y.o. female presenting on 03/02/2018 for Diabetes   HPI  CHRONIC DM, Type 2, with CKD-III Recent A1c has been stable 6.8 to 7.0. Due for A1c today. CBGs: - Avg110-140, Low80-90 (rare symptoms of hypoglycemia with some shakiness and not feeling "right"), checks CBGs1-2x daily Meds: Glimepiride 2-4mg  daily - IN past was taken off Metformin due to CKD. Has failed Januvia Reports good compliance. 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 Now she admits feeling more "fatigued" and tired most of the time. Still sleeping better, unsure if she is having any potential side effect from lexapro - She admits occasional  - She is less active, now she is home mostly, she is busy providing care for Linna Hoff - still causing her stress and anxiety as primary stressor - Prior failed Fluoxetine Denies depression  PMH - L ear tinnitus / Hearing loss  Additional questions - Patient expresses concern about her husband's health. She is primary caregiver, and this is a significant stressor on her, she is managing a lot and he is still cognitively doing well but has difficulty hearing, and often he has had some issues with adhering to his medication. She is asking about other options for help for him in future, he has discussed with her his goals of care wishes. He  has had Home Health care nursing in past, but she does not feel like he needs day to day nursing.   Depression screen [facility] 2/9 03/02/2018 03/02/2018 10/19/2017  Decreased Interest 0 0 0  Down, Depressed, Hopeless 0 0 0  PHQ - 2 Score 0 0 0  Altered sleeping 0 - -  Tired, decreased energy 1 - -  Change in appetite 0 - -  Feeling bad or failure about yourself  0 - -  Trouble concentrating 1 - -  Moving slowly or fidgety/restless 0 - -  Suicidal thoughts 0 - -  PHQ-9 Score 2 - -  Difficult doing work/chores Not difficult at all - -   GAD 7 : Generalized Anxiety Score 03/02/2018 12/01/2016 10/06/2016 08/25/2016  Nervous, Anxious, on Edge 0 0 0 3  Control/stop worrying 0 1 0 2  Worry too much - different things 0 0 0 2  Trouble relaxing 0 0 0 1  Restless 0 0 0 1  Easily annoyed or irritable 0 0 0 2  Afraid - awful might happen 0 0 0 2  Total GAD 7 Score 0 1 0 13  Anxiety Difficulty Not difficult at all Not difficult at all Not difficult at all -     Social History   Tobacco Use  . Smoking status: Never Smoker  . Smokeless tobacco: Never Used  Substance Use Topics  . Alcohol use: Not Currently  . Drug use: No    Review of Systems Per HPI unless specifically indicated above     Objective:  BP (!) 106/56   Pulse 67   Temp 98.5 F (36.9 C) (Oral)   Resp 16   Ht 4\' 11"  (1.499 m)   Wt 133 lb 9.6 oz (60.6 kg)   BMI 26.98 kg/m   Wt Readings from Last 3 Encounters:  03/02/18 133 lb 9.6 oz (60.6 kg)  10/19/17 130 lb (59 kg)  08/27/17 129 lb 9.6 oz (58.8 kg)    Physical Exam Vitals signs and nursing note reviewed.  Constitutional:      General: She is not in acute distress.    Appearance: She is well-developed. She is not diaphoretic.     Comments: Well-appearing, comfortable, cooperative  HENT:     Head: Normocephalic and atraumatic.  Eyes:     General:        Right eye: No discharge.        Left eye: No discharge.     Conjunctiva/sclera: Conjunctivae normal.    Cardiovascular:     Rate and Rhythm: Normal rate and regular rhythm.     Pulses: Normal pulses.     Heart sounds: Normal heart sounds. No murmur.  Pulmonary:     Effort: Pulmonary effort is normal.  Skin:    General: Skin is warm and dry.     Findings: No erythema or rash.  Neurological:     Mental Status: She is alert and oriented to person, place, and time.     Sensory: No sensory deficit.     Comments: No tremors  Psychiatric:        Behavior: Behavior normal.     Comments: Well groomed, good eye contact, normal speech and thoughts. Slightly anxious at times when discussing her concern with her husband's health.      Recent Labs    03/03/17 1131 08/20/17 0809 03/02/18 1122  HGBA1C 7.0* 7.3* 6.9*    Results for orders placed or performed in visit on 03/02/18  POCT HgB A1C  Result Value Ref Range   Hemoglobin A1C 6.9 (A) 4.0 - 5.6 %      Assessment & Plan:   Problem List Items Addressed This Visit    Anxiety associated with depression    Clinically seems mostly stable with anxiety and mood, but worsening caregiver burden stress  Plan - Discussed likely still benefiting from Escitalopram 5mg  daily, agree to keep taking this for now, instead I think some of her tremors and anxiety like symptoms may be more physical from the sulfonylurea - see A&P - Offered additional support with her husband Linna Hoff - handout given for [facility] and she can check them out as an option to have consultation help evaluate Dan for symptom, med management, and goals of care - in future if he needs more Home Health assistance or Home Aide she can notify us as well. She will request referral to palliative if interested - Follow-up in 3 months      CKD stage 3 due to type 2 diabetes mellitus ([place])    Relatively CKD-III, secondary to HTN DM age Followed by Nephrology [facility] Dr Candiss Norse  Plan: 1. Continue ACEi Lisinopril low dose 5mg  Improve hydration Follow-up      Relevant  Medications   glimepiride (AMARYL) 4 MG tablet   Controlled type 2 diabetes mellitus with diabetic nephropathy (HCC) - Primary    Improved DM control now A1c 6.9, from prior >7 Concern with hypoglycemia now as complication - symptomatic in CBG 80-90s, suspect secondary to sulfonylurea in 85 yr old  patient Complications - hypoglycemia, nephropathy with CKD III-IV, also in setting HTN - Contraindicated - SGLT2. H/o failed Glyburide, Januvia. Not interested in injectable - Off metformin in past due to CKD  Plan:  1. Now STOP Glimepiride 2-4mg  daily - remain OFF medication. Precaution on sulfonylurea. Advised that she may use HALF tablet of Glimepiride for 2mg  dose ONLY PRN if CBG >200-250 if eat out or other concerns, but use this very rarely. - Consider 2nd option Metformin very low dose 250 or 500 daily only if needed if elevated CBG while off sulfonylurea, limited other med options 2. Cannot take ASA due to CKD, continue statin, on ACEi 3. Encouraged lifestyle improvements with DM diet, continue walking 4. Follow-up 3 months - for DM A1c med adjust  CALL office in 1 month for CBG update and med information - if need we can send metformin low dose at that time.      Relevant Medications   glimepiride (AMARYL) 4 MG tablet   Other Relevant Orders   POCT HgB A1C (Completed)   Hypertension    Well-controlled HTN, remains off all meds - Home BP readings normal Complication with CKD III followed by Nephrology [facility] Dr Candiss Norse    Plan:  1. Continue low dose Lisinopril 5mg  daily 2. Encourage improved lifestyle - low sodium diet, continue walking regular exercise 3. Continue monitor BP outside office, bring readings to next visit, if persistently >140/90 or new symptoms notify office sooner       Other Visit Diagnoses    Hypoglycemia associated with type 2 diabetes mellitus ([place])       Relevant Medications   glimepiride (AMARYL) 4 MG tablet      No orders of the defined types were placed  in this encounter.   Follow up plan: Return in about 3 months (around 05/31/2018) for DM A1c, med adjust (off sulfonylurea - prior hypoglycemia), anxiety f/u.   Nobie Putnam, [facility] 03/02/2018, 11:21 AM

## 2018-03-02 NOTE — Assessment & Plan Note (Signed)
Well-controlled HTN, remains off all meds - Home BP readings normal Complication with CKD III followed by Nephrology [facility] Dr Candiss Norse    Plan:  1. Continue low dose Lisinopril 5mg  daily 2. Encourage improved lifestyle - low sodium diet, continue walking regular exercise 3. Continue monitor BP outside office, bring readings to next visit, if persistently >140/90 or new symptoms notify office sooner

## 2018-03-02 NOTE — Patient Instructions (Addendum)
Thank you for coming to the office today.  I think your symptoms are actually caused by some low sugar and side effect of Glimepiride.  A1c 6.9 - improved  STOP Glimepiride every day. NOW only use HALF pill when needed once a day max - should be only once every week or few weeks at most. ONLY if sugar is >200-250 and you are concerned or eat out etc. As discussed.  Call in 1 month with update and sugar readings. We may need to add on a LOW DOSE metformin if needed at that time.  For now continue Lexapro 5mg  daily - we can adjust this in future if need.  ------------------------------------------------------------------------  Please call this company to learn more - let me know if need referral  Manufacturing [employer] Address: [address], [place], [place] [address] Phone: [phone] (Main office # - call for general questions and learn more about services)   Please schedule a Follow-up Appointment to: Return in about 3 months (around 05/31/2018) for DM A1c, med adjust (off sulfonylurea - prior hypoglycemia), anxiety f/u.  If you have any other questions or concerns, please feel free to call the office or send a message through [place]. You may also schedule an earlier appointment if necessary.  Additionally, you may be receiving a survey about your experience at our office within a few days to 1 week by e-mail or mail. We value your feedback.  Lisa Putnam, DO [place]

## 2018-03-02 NOTE — Assessment & Plan Note (Signed)
Relatively CKD-III, secondary to HTN DM age Followed by Nephrology [facility] Dr Candiss Norse  Plan: 1. Continue ACEi Lisinopril low dose 5mg  Improve hydration Follow-up

## 2018-03-07 ENCOUNTER — Other Ambulatory Visit: Payer: Self-pay | Admitting: Family Medicine

## 2018-03-07 DIAGNOSIS — E1121 Type 2 diabetes mellitus with diabetic nephropathy: Secondary | ICD-10-CM

## 2018-03-18 DIAGNOSIS — M47812 Spondylosis without myelopathy or radiculopathy, cervical region: Secondary | ICD-10-CM | POA: Diagnosis not present

## 2018-03-18 DIAGNOSIS — M5030 Other cervical disc degeneration, unspecified cervical region: Secondary | ICD-10-CM | POA: Diagnosis not present

## 2018-03-18 DIAGNOSIS — M542 Cervicalgia: Secondary | ICD-10-CM | POA: Diagnosis not present

## 2018-03-23 DIAGNOSIS — E875 Hyperkalemia: Secondary | ICD-10-CM | POA: Diagnosis not present

## 2018-03-23 DIAGNOSIS — N183 Chronic kidney disease, stage 3 (moderate): Secondary | ICD-10-CM | POA: Diagnosis not present

## 2018-03-23 DIAGNOSIS — E1129 Type 2 diabetes mellitus with other diabetic kidney complication: Secondary | ICD-10-CM | POA: Diagnosis not present

## 2018-03-23 DIAGNOSIS — I129 Hypertensive chronic kidney disease with stage 1 through stage 4 chronic kidney disease, or unspecified chronic kidney disease: Secondary | ICD-10-CM | POA: Diagnosis not present

## 2018-03-23 LAB — MAGNESIUM
Albumin: 4.6
Magnesium: 2.1
PTH: 46
Phosphorus: 3.4

## 2018-03-23 LAB — BASIC METABOLIC PANEL
BUN: 28 — AB (ref 4–21)
Creatinine: 1.5 — AB (ref 0.5–1.1)
Glucose: 155
Potassium: 4.4 (ref 3.4–5.3)
Sodium: 133 — AB (ref 137–147)

## 2018-03-23 LAB — CBC AND DIFFERENTIAL
HEMATOCRIT: 33 — AB (ref 36–46)
Hemoglobin: 11.1 — AB (ref 12.0–16.0)
Neutrophils Absolute: 6863
PLATELETS: 340 (ref 150–399)
WBC: 9.3

## 2018-04-04 ENCOUNTER — Encounter: Payer: Self-pay | Admitting: Family Medicine

## 2018-04-04 DIAGNOSIS — M40292 Other kyphosis, cervical region: Secondary | ICD-10-CM | POA: Diagnosis not present

## 2018-04-04 DIAGNOSIS — M9903 Segmental and somatic dysfunction of lumbar region: Secondary | ICD-10-CM | POA: Diagnosis not present

## 2018-04-04 DIAGNOSIS — M9901 Segmental and somatic dysfunction of cervical region: Secondary | ICD-10-CM | POA: Diagnosis not present

## 2018-04-04 DIAGNOSIS — M4306 Spondylolysis, lumbar region: Secondary | ICD-10-CM | POA: Diagnosis not present

## 2018-04-05 ENCOUNTER — Other Ambulatory Visit: Payer: PPO

## 2018-04-05 DIAGNOSIS — M9901 Segmental and somatic dysfunction of cervical region: Secondary | ICD-10-CM | POA: Diagnosis not present

## 2018-04-05 DIAGNOSIS — M40292 Other kyphosis, cervical region: Secondary | ICD-10-CM | POA: Diagnosis not present

## 2018-04-05 DIAGNOSIS — M9903 Segmental and somatic dysfunction of lumbar region: Secondary | ICD-10-CM | POA: Diagnosis not present

## 2018-04-05 DIAGNOSIS — M4306 Spondylolysis, lumbar region: Secondary | ICD-10-CM | POA: Diagnosis not present

## 2018-04-06 DIAGNOSIS — M40292 Other kyphosis, cervical region: Secondary | ICD-10-CM | POA: Diagnosis not present

## 2018-04-06 DIAGNOSIS — M4306 Spondylolysis, lumbar region: Secondary | ICD-10-CM | POA: Diagnosis not present

## 2018-04-06 DIAGNOSIS — M9903 Segmental and somatic dysfunction of lumbar region: Secondary | ICD-10-CM | POA: Diagnosis not present

## 2018-04-06 DIAGNOSIS — M9901 Segmental and somatic dysfunction of cervical region: Secondary | ICD-10-CM | POA: Diagnosis not present

## 2018-04-07 ENCOUNTER — Other Ambulatory Visit: Payer: Self-pay | Admitting: Family Medicine

## 2018-04-07 DIAGNOSIS — N183 Chronic kidney disease, stage 3 unspecified: Secondary | ICD-10-CM

## 2018-04-07 DIAGNOSIS — I10 Essential (primary) hypertension: Secondary | ICD-10-CM

## 2018-04-07 DIAGNOSIS — E1122 Type 2 diabetes mellitus with diabetic chronic kidney disease: Secondary | ICD-10-CM

## 2018-04-08 DIAGNOSIS — M9903 Segmental and somatic dysfunction of lumbar region: Secondary | ICD-10-CM | POA: Diagnosis not present

## 2018-04-08 DIAGNOSIS — M9901 Segmental and somatic dysfunction of cervical region: Secondary | ICD-10-CM | POA: Diagnosis not present

## 2018-04-08 DIAGNOSIS — M4306 Spondylolysis, lumbar region: Secondary | ICD-10-CM | POA: Diagnosis not present

## 2018-04-08 DIAGNOSIS — M40292 Other kyphosis, cervical region: Secondary | ICD-10-CM | POA: Diagnosis not present

## 2018-04-22 ENCOUNTER — Other Ambulatory Visit: Payer: Self-pay | Admitting: Family Medicine

## 2018-04-22 DIAGNOSIS — E1169 Type 2 diabetes mellitus with other specified complication: Secondary | ICD-10-CM

## 2018-04-22 DIAGNOSIS — E785 Hyperlipidemia, unspecified: Principal | ICD-10-CM

## 2018-05-11 ENCOUNTER — Other Ambulatory Visit: Payer: Self-pay | Admitting: Family Medicine

## 2018-05-11 ENCOUNTER — Other Ambulatory Visit: Payer: PPO

## 2018-05-11 DIAGNOSIS — K219 Gastro-esophageal reflux disease without esophagitis: Secondary | ICD-10-CM

## 2018-06-01 ENCOUNTER — Other Ambulatory Visit: Payer: Self-pay | Admitting: Family Medicine

## 2018-06-01 DIAGNOSIS — E1121 Type 2 diabetes mellitus with diabetic nephropathy: Secondary | ICD-10-CM

## 2018-06-03 ENCOUNTER — Other Ambulatory Visit: Payer: PPO

## 2018-06-03 ENCOUNTER — Other Ambulatory Visit: Payer: Self-pay

## 2018-06-03 DIAGNOSIS — E1121 Type 2 diabetes mellitus with diabetic nephropathy: Secondary | ICD-10-CM

## 2018-06-04 LAB — HEMOGLOBIN A1C
Hgb A1c MFr Bld: 7.4 % of total Hgb — ABNORMAL HIGH (ref <5.7)
Mean Plasma Glucose: 166 (calc)
eAG (mmol/L): 9.2 (calc)

## 2018-06-06 ENCOUNTER — Other Ambulatory Visit: Payer: Self-pay

## 2018-06-06 ENCOUNTER — Encounter: Payer: Self-pay | Admitting: Family Medicine

## 2018-06-06 ENCOUNTER — Ambulatory Visit (INDEPENDENT_AMBULATORY_CARE_PROVIDER_SITE_OTHER): Payer: PPO | Admitting: Family Medicine

## 2018-06-06 VITALS — BP 114/53 | HR 76 | Temp 98.9°F | Resp 16 | Ht 59.0 in | Wt 130.0 lb

## 2018-06-06 DIAGNOSIS — S46912A Strain of unspecified muscle, fascia and tendon at shoulder and upper arm level, left arm, initial encounter: Secondary | ICD-10-CM | POA: Diagnosis not present

## 2018-06-06 DIAGNOSIS — E1121 Type 2 diabetes mellitus with diabetic nephropathy: Secondary | ICD-10-CM | POA: Diagnosis not present

## 2018-06-06 MED ORDER — TRAMADOL HCL 50 MG PO TABS: 50.0000 mg | ORAL_TABLET | Freq: Two times a day (BID) | ORAL | 0 refills | Status: AC | PRN

## 2018-06-06 NOTE — Assessment & Plan Note (Signed)
Elevated A1c to 7.4 Less concern for hypoglycemia Seems some minor symptoms episodic not related to low sugar Complications - hypoglycemia, nephropathy with CKD III-IV, also in setting HTN - Contraindicated - SGLT2. H/o failed Glyburide, Januvia. Not interested in injectable - Off metformin in past due to CKD  Plan:  1. Resumed Glimepiride 2mg  BID - despite previous instructions due to higher sugars - Consider 2nd option Metformin very low dose 250 or 500 daily only if needed if elevated CBG while off sulfonylurea, limited other med options 2. Cannot take ASA due to CKD, continue statin, on ACEi 3. Encouraged lifestyle improvements with DM diet, continue walking

## 2018-06-06 NOTE — Patient Instructions (Addendum)
Try Tramadol as needed for shoulder pain  Continue baclofen, icing, exercise  Return to Chiropractor  Then if need can return to [place] to discuss possible injection   DUE for FASTING BLOOD WORK (no food or drink after midnight before the lab appointment, only water or coffee without cream/sugar on the morning of)  SCHEDULE "Lab Only" visit in the morning at the clinic for lab draw in 3 MONTHS   - Make sure Lab Only appointment is at about 1 week before your next appointment, so that results will be available  For Lab Results, once available within 2-3 days of blood draw, you can can log in to MyChart online to view your results and a brief explanation. Also, we can discuss results at next follow-up visit.   Please schedule a Follow-up Appointment to: Return in about 3 months (around 09/06/2018) for Annual Physical.  If you have any other questions or concerns, please feel free to call the office or send a message through [place]. You may also schedule an earlier appointment if necessary.  Additionally, you may be receiving a survey about your experience at our office within a few days to 1 week by e-mail or mail. We value your feedback.  Nobie Putnam, DO [place]

## 2018-06-06 NOTE — Progress Notes (Signed)
Subjective:    Patient ID: Lisa Mills, female    DOB: 06-01-1929, 85 y.o.   MRN: [id number]  Lisa Mills is a 85 y.o. female presenting on 06/06/2018 for Diabetes and Shoulder Pain (onset 2 weeks Left side)   HPI   CHRONIC DM, Type 2, with CKD-III - Last visit with me 02/2018, same problem Diabetes, treated with discontinued Glimepiride at that time due to concerns of dizziness / lightheadedness, see prior notes for background information. - Interval update with no longer hypoglycemia readings 80-90s, since stopping Sulfonylurea, but still having episodes of similar dizziness/lightheadedness at times, regardless of cbg reading 100-150 approximate - She had elevated blood sugar, and then she has decided on her own to resume Glimepiride half pill = 2mg  BID dosing - Today patient reports doing better back on medicine, better controlled - Admits ear fullness, tinnitus in L ear, occasional brief pain Meds:Glimepiride 2-4mg  daily - IN past was taken off Metformin due to CKD. Has failed Januvia Reports good compliance. Tolerating well w/o side-effects Currentlyon ACEi- last urine micro 0 (11/2016) - has since resumed ACEi low dose Lifestyle: - Diet (improved DM diet) - Exercise (walking occasionally,not regularly- limited as caregiver for husband) Due for DM Eye exam, scheduled at [facility] in September 2019 Admits occasional hypoglycemia Denies hypoglycemia  Neck Pain Flare up / Headache / Shoulder / OA DJD - Recent flare up 02/2018, she took baclofen and arthritis medication OTC at that time, Tylenol - She went to [facility]) - had X-ray neck, dx with cervicalgia, treated with prednisone dosepak - which improved her symptoms, and she then went to chiropractor x 6 sessions, felt "pretty good" and then would still get sore sometimes, she remained home and unable to go back to chiropractor - Had x-ray with following impression: severe to space  degeneration C5-C6. There is anterior bridging osteophytes of C5-C6 with anterior bridging osteophytes at C6-C7. There is moderate cervical spondylosis. No significant spondylolisthesis. - She was advised by Orthopedic that they could consider ESI if needed if not improved on prednisone - She recalls episode that triggered her shoulder she threw out a bag of heavy garbage - She tried a Tramadol from family member, without any relief   Depression screen [facility] 2/9 03/02/2018 03/02/2018 10/19/2017  Decreased Interest 0 0 0  Down, Depressed, Hopeless 0 0 0  PHQ - 2 Score 0 0 0  Altered sleeping 0 - -  Tired, decreased energy 1 - -  Change in appetite 0 - -  Feeling bad or failure about yourself  0 - -  Trouble concentrating 1 - -  Moving slowly or fidgety/restless 0 - -  Suicidal thoughts 0 - -  PHQ-9 Score 2 - -  Difficult doing work/chores Not difficult at all - -    Social History   Tobacco Use  . Smoking status: Never Smoker  . Smokeless tobacco: Never Used  Substance Use Topics  . Alcohol use: Not Currently  . Drug use: No    Review of Systems Per HPI unless specifically indicated above     Objective:    BP (!) 114/53   Pulse 76   Temp 98.9 F (37.2 C) (Oral)   Resp 16   Ht 4\' 11"  (1.499 m)   Wt 130 lb (59 kg)   BMI 26.26 kg/m   Wt Readings from Last 3 Encounters:  06/06/18 130 lb (59 kg)  03/02/18 133 lb 9.6 oz (60.6 kg)  10/19/17 130 lb (59 kg)    Physical Exam Vitals signs and nursing note reviewed.  Constitutional:      General: She is not in acute distress.    Appearance: She is well-developed. She is not diaphoretic.     Comments: Well-appearing, comfortable, cooperative  HENT:     Head: Normocephalic and atraumatic.  Eyes:     General:        Right eye: No discharge.        Left eye: No discharge.     Conjunctiva/sclera: Conjunctivae normal.  Cardiovascular:     Rate and Rhythm: Normal rate.  Pulmonary:     Effort: Pulmonary effort is normal.   Musculoskeletal:     Comments: LEFT Shoulder Inspection: Normal appearance bilateral symmetrical Palpation: Mild tender posterior aspect only thoracic paraspinal and medial from scapula, Non-tender to palpation over anterior, lateral shoulder  ROM: Mostly intact forward flexion bilateral, limited abduction Left arm and extension, unable to do internal rotation well without pain. Special Testing: Rotator cuff testing negative for weakness but positive for some discomfort or pain, mostly negative impingement testing today. Strength: Normal strength 5/5 flex/ext, ext rot / int rot, grip, rotator cuff str testing. Neurovascular: Distally intact pulses, sensation to light touch  Skin:    General: Skin is warm and dry.     Findings: No erythema or rash.  Neurological:     Mental Status: She is alert and oriented to person, place, and time.  Psychiatric:        Behavior: Behavior normal.     Comments: Well groomed, good eye contact, normal speech and thoughts      Recent Labs    08/20/17 0809 03/02/18 1122 06/03/18 0824  HGBA1C 7.3* 6.9* 7.4*    Results for orders placed or performed in visit on 06/03/18  Hemoglobin A1c  Result Value Ref Range   Hgb A1c MFr Bld 7.4 (H) <5.7 % of total Hgb   Mean Plasma Glucose 166 (calc)   eAG (mmol/L) 9.2 (calc)      Assessment & Plan:   Problem List Items Addressed This Visit    Controlled type 2 diabetes mellitus with diabetic nephropathy (HCC)    Elevated A1c to 7.4 Less concern for hypoglycemia Seems some minor symptoms episodic not related to low sugar Complications - hypoglycemia, nephropathy with CKD III-IV, also in setting HTN - Contraindicated - SGLT2. H/o failed Glyburide, Januvia. Not interested in injectable - Off metformin in past due to CKD  Plan:  1. Resumed Glimepiride 2mg  BID - despite previous instructions due to higher sugars - Consider 2nd option Metformin very low dose 250 or 500 daily only if needed if elevated CBG  while off sulfonylurea, limited other med options 2. Cannot take ASA due to CKD, continue statin, on ACEi 3. Encouraged lifestyle improvements with DM diet, continue walking       Other Visit Diagnoses    Strain of left shoulder, initial encounter    -  Primary   Relevant Medications   traMADol (ULTRAM) 50 MG tablet      Consistent with chronic L shoulder pain vs-shoulder pain vs rotator cuff tendinopathy with some reduced active ROM but without significant evidence of muscle tear (no weakness).  - Seems reproduced pain on extension abduction Followed by [facility], had imaging C-spine, cervical radiculopathy, without imaging on shoulders  Considered subacromial steroid injection but based on exam not consistent with bursitis  Limited options, cannot take NSAID due to GI history -  offered Trial on Tramadol PRN  Advised return to [facility] for further eval and imaging  May take Tylenol Ex Str 1-2 q 6 hr PRN Relative rest but keep shoulder mobile, demonstrated ROM exercises, avoid heavy lifting Follow-up 4-6 weeks if not improved for re-evaluation, consider referral to Physical Therapy, X-rays, and or subacromial steroid injection   Meds ordered this encounter  Medications  . traMADol (ULTRAM) 50 MG tablet    Sig: Take 1-2 tablets (50-100 mg total) by mouth 2 (two) times daily as needed for up to 5 days.    Dispense:  20 tablet    Refill:  0     Follow up plan: Return in about 3 months (around 09/06/2018) for Annual Physical.  Future labs ordered for 09/05/18  Nobie Putnam, [facility] 06/06/2018, 1:38 PM

## 2018-06-14 ENCOUNTER — Other Ambulatory Visit: Payer: Self-pay | Admitting: Family Medicine

## 2018-06-14 DIAGNOSIS — E038 Other specified hypothyroidism: Secondary | ICD-10-CM

## 2018-06-16 DIAGNOSIS — H6123 Impacted cerumen, bilateral: Secondary | ICD-10-CM | POA: Diagnosis not present

## 2018-06-16 DIAGNOSIS — R42 Dizziness and giddiness: Secondary | ICD-10-CM | POA: Diagnosis not present

## 2018-06-20 DIAGNOSIS — M75102 Unspecified rotator cuff tear or rupture of left shoulder, not specified as traumatic: Secondary | ICD-10-CM | POA: Diagnosis not present

## 2018-06-20 DIAGNOSIS — M5030 Other cervical disc degeneration, unspecified cervical region: Secondary | ICD-10-CM | POA: Diagnosis not present

## 2018-06-20 DIAGNOSIS — M25512 Pain in left shoulder: Secondary | ICD-10-CM | POA: Diagnosis not present

## 2018-06-20 DIAGNOSIS — M47812 Spondylosis without myelopathy or radiculopathy, cervical region: Secondary | ICD-10-CM | POA: Diagnosis not present

## 2018-06-22 ENCOUNTER — Other Ambulatory Visit: Payer: Self-pay

## 2018-06-22 ENCOUNTER — Ambulatory Visit
Admission: RE | Admit: 2018-06-22 | Discharge: 2018-06-22 | Disposition: A | Discharge status: Home / Self Care | Payer: PPO | Source: Ambulatory Visit | Attending: Family Medicine | Admitting: Family Medicine

## 2018-06-22 DIAGNOSIS — R921 Mammographic calcification found on diagnostic imaging of breast: Secondary | ICD-10-CM | POA: Diagnosis not present

## 2018-07-04 DIAGNOSIS — M9903 Segmental and somatic dysfunction of lumbar region: Secondary | ICD-10-CM | POA: Diagnosis not present

## 2018-07-04 DIAGNOSIS — M40292 Other kyphosis, cervical region: Secondary | ICD-10-CM | POA: Diagnosis not present

## 2018-07-04 DIAGNOSIS — M4306 Spondylolysis, lumbar region: Secondary | ICD-10-CM | POA: Diagnosis not present

## 2018-07-04 DIAGNOSIS — M9901 Segmental and somatic dysfunction of cervical region: Secondary | ICD-10-CM | POA: Diagnosis not present

## 2018-07-06 ENCOUNTER — Other Ambulatory Visit: Payer: Self-pay | Admitting: Family Medicine

## 2018-07-06 DIAGNOSIS — M9903 Segmental and somatic dysfunction of lumbar region: Secondary | ICD-10-CM | POA: Diagnosis not present

## 2018-07-06 DIAGNOSIS — M4306 Spondylolysis, lumbar region: Secondary | ICD-10-CM | POA: Diagnosis not present

## 2018-07-06 DIAGNOSIS — R921 Mammographic calcification found on diagnostic imaging of breast: Secondary | ICD-10-CM

## 2018-07-06 DIAGNOSIS — R928 Other abnormal and inconclusive findings on diagnostic imaging of breast: Secondary | ICD-10-CM

## 2018-07-06 DIAGNOSIS — Z1231 Encounter for screening mammogram for malignant neoplasm of breast: Secondary | ICD-10-CM

## 2018-07-06 DIAGNOSIS — M40292 Other kyphosis, cervical region: Secondary | ICD-10-CM | POA: Diagnosis not present

## 2018-07-06 DIAGNOSIS — M9901 Segmental and somatic dysfunction of cervical region: Secondary | ICD-10-CM | POA: Diagnosis not present

## 2018-07-08 ENCOUNTER — Other Ambulatory Visit: Payer: Self-pay | Admitting: Family Medicine

## 2018-07-08 DIAGNOSIS — E1121 Type 2 diabetes mellitus with diabetic nephropathy: Secondary | ICD-10-CM

## 2018-07-08 DIAGNOSIS — M9901 Segmental and somatic dysfunction of cervical region: Secondary | ICD-10-CM | POA: Diagnosis not present

## 2018-07-08 DIAGNOSIS — M4306 Spondylolysis, lumbar region: Secondary | ICD-10-CM | POA: Diagnosis not present

## 2018-07-08 DIAGNOSIS — M9903 Segmental and somatic dysfunction of lumbar region: Secondary | ICD-10-CM | POA: Diagnosis not present

## 2018-07-08 DIAGNOSIS — M40292 Other kyphosis, cervical region: Secondary | ICD-10-CM | POA: Diagnosis not present

## 2018-07-11 DIAGNOSIS — M9901 Segmental and somatic dysfunction of cervical region: Secondary | ICD-10-CM | POA: Diagnosis not present

## 2018-07-11 DIAGNOSIS — M9903 Segmental and somatic dysfunction of lumbar region: Secondary | ICD-10-CM | POA: Diagnosis not present

## 2018-07-11 DIAGNOSIS — M4306 Spondylolysis, lumbar region: Secondary | ICD-10-CM | POA: Diagnosis not present

## 2018-07-11 DIAGNOSIS — M40292 Other kyphosis, cervical region: Secondary | ICD-10-CM | POA: Diagnosis not present

## 2018-07-13 DIAGNOSIS — M4306 Spondylolysis, lumbar region: Secondary | ICD-10-CM | POA: Diagnosis not present

## 2018-07-13 DIAGNOSIS — M40292 Other kyphosis, cervical region: Secondary | ICD-10-CM | POA: Diagnosis not present

## 2018-07-13 DIAGNOSIS — M9901 Segmental and somatic dysfunction of cervical region: Secondary | ICD-10-CM | POA: Diagnosis not present

## 2018-07-13 DIAGNOSIS — M9903 Segmental and somatic dysfunction of lumbar region: Secondary | ICD-10-CM | POA: Diagnosis not present

## 2018-07-15 DIAGNOSIS — M9903 Segmental and somatic dysfunction of lumbar region: Secondary | ICD-10-CM | POA: Diagnosis not present

## 2018-07-15 DIAGNOSIS — M40292 Other kyphosis, cervical region: Secondary | ICD-10-CM | POA: Diagnosis not present

## 2018-07-15 DIAGNOSIS — M4306 Spondylolysis, lumbar region: Secondary | ICD-10-CM | POA: Diagnosis not present

## 2018-07-15 DIAGNOSIS — M9901 Segmental and somatic dysfunction of cervical region: Secondary | ICD-10-CM | POA: Diagnosis not present

## 2018-07-18 DIAGNOSIS — M9903 Segmental and somatic dysfunction of lumbar region: Secondary | ICD-10-CM | POA: Diagnosis not present

## 2018-07-18 DIAGNOSIS — M4306 Spondylolysis, lumbar region: Secondary | ICD-10-CM | POA: Diagnosis not present

## 2018-07-18 DIAGNOSIS — M9901 Segmental and somatic dysfunction of cervical region: Secondary | ICD-10-CM | POA: Diagnosis not present

## 2018-07-18 DIAGNOSIS — M40292 Other kyphosis, cervical region: Secondary | ICD-10-CM | POA: Diagnosis not present

## 2018-07-20 DIAGNOSIS — M40292 Other kyphosis, cervical region: Secondary | ICD-10-CM | POA: Diagnosis not present

## 2018-07-20 DIAGNOSIS — M9903 Segmental and somatic dysfunction of lumbar region: Secondary | ICD-10-CM | POA: Diagnosis not present

## 2018-07-20 DIAGNOSIS — M9901 Segmental and somatic dysfunction of cervical region: Secondary | ICD-10-CM | POA: Diagnosis not present

## 2018-07-20 DIAGNOSIS — M4306 Spondylolysis, lumbar region: Secondary | ICD-10-CM | POA: Diagnosis not present

## 2018-07-25 DIAGNOSIS — M4306 Spondylolysis, lumbar region: Secondary | ICD-10-CM | POA: Diagnosis not present

## 2018-07-25 DIAGNOSIS — M9901 Segmental and somatic dysfunction of cervical region: Secondary | ICD-10-CM | POA: Diagnosis not present

## 2018-07-25 DIAGNOSIS — M40292 Other kyphosis, cervical region: Secondary | ICD-10-CM | POA: Diagnosis not present

## 2018-07-25 DIAGNOSIS — M9903 Segmental and somatic dysfunction of lumbar region: Secondary | ICD-10-CM | POA: Diagnosis not present

## 2018-07-27 DIAGNOSIS — M40292 Other kyphosis, cervical region: Secondary | ICD-10-CM | POA: Diagnosis not present

## 2018-07-27 DIAGNOSIS — M9903 Segmental and somatic dysfunction of lumbar region: Secondary | ICD-10-CM | POA: Diagnosis not present

## 2018-07-27 DIAGNOSIS — M4306 Spondylolysis, lumbar region: Secondary | ICD-10-CM | POA: Diagnosis not present

## 2018-07-27 DIAGNOSIS — M9901 Segmental and somatic dysfunction of cervical region: Secondary | ICD-10-CM | POA: Diagnosis not present

## 2018-07-29 DIAGNOSIS — M4306 Spondylolysis, lumbar region: Secondary | ICD-10-CM | POA: Diagnosis not present

## 2018-07-29 DIAGNOSIS — M9903 Segmental and somatic dysfunction of lumbar region: Secondary | ICD-10-CM | POA: Diagnosis not present

## 2018-07-29 DIAGNOSIS — M9901 Segmental and somatic dysfunction of cervical region: Secondary | ICD-10-CM | POA: Diagnosis not present

## 2018-07-29 DIAGNOSIS — M40292 Other kyphosis, cervical region: Secondary | ICD-10-CM | POA: Diagnosis not present

## 2018-08-01 DIAGNOSIS — M4306 Spondylolysis, lumbar region: Secondary | ICD-10-CM | POA: Diagnosis not present

## 2018-08-01 DIAGNOSIS — M9901 Segmental and somatic dysfunction of cervical region: Secondary | ICD-10-CM | POA: Diagnosis not present

## 2018-08-01 DIAGNOSIS — M40292 Other kyphosis, cervical region: Secondary | ICD-10-CM | POA: Diagnosis not present

## 2018-08-01 DIAGNOSIS — M9903 Segmental and somatic dysfunction of lumbar region: Secondary | ICD-10-CM | POA: Diagnosis not present

## 2018-08-03 DIAGNOSIS — M4306 Spondylolysis, lumbar region: Secondary | ICD-10-CM | POA: Diagnosis not present

## 2018-08-03 DIAGNOSIS — M40292 Other kyphosis, cervical region: Secondary | ICD-10-CM | POA: Diagnosis not present

## 2018-08-03 DIAGNOSIS — M9901 Segmental and somatic dysfunction of cervical region: Secondary | ICD-10-CM | POA: Diagnosis not present

## 2018-08-03 DIAGNOSIS — M9903 Segmental and somatic dysfunction of lumbar region: Secondary | ICD-10-CM | POA: Diagnosis not present

## 2018-08-05 DIAGNOSIS — M9903 Segmental and somatic dysfunction of lumbar region: Secondary | ICD-10-CM | POA: Diagnosis not present

## 2018-08-05 DIAGNOSIS — M4306 Spondylolysis, lumbar region: Secondary | ICD-10-CM | POA: Diagnosis not present

## 2018-08-05 DIAGNOSIS — M9901 Segmental and somatic dysfunction of cervical region: Secondary | ICD-10-CM | POA: Diagnosis not present

## 2018-08-05 DIAGNOSIS — M40292 Other kyphosis, cervical region: Secondary | ICD-10-CM | POA: Diagnosis not present

## 2018-08-08 DIAGNOSIS — M40292 Other kyphosis, cervical region: Secondary | ICD-10-CM | POA: Diagnosis not present

## 2018-08-08 DIAGNOSIS — M9903 Segmental and somatic dysfunction of lumbar region: Secondary | ICD-10-CM | POA: Diagnosis not present

## 2018-08-08 DIAGNOSIS — M9901 Segmental and somatic dysfunction of cervical region: Secondary | ICD-10-CM | POA: Diagnosis not present

## 2018-08-08 DIAGNOSIS — M4306 Spondylolysis, lumbar region: Secondary | ICD-10-CM | POA: Diagnosis not present

## 2018-08-10 DIAGNOSIS — M4306 Spondylolysis, lumbar region: Secondary | ICD-10-CM | POA: Diagnosis not present

## 2018-08-10 DIAGNOSIS — M9901 Segmental and somatic dysfunction of cervical region: Secondary | ICD-10-CM | POA: Diagnosis not present

## 2018-08-10 DIAGNOSIS — M40292 Other kyphosis, cervical region: Secondary | ICD-10-CM | POA: Diagnosis not present

## 2018-08-10 DIAGNOSIS — M9903 Segmental and somatic dysfunction of lumbar region: Secondary | ICD-10-CM | POA: Diagnosis not present

## 2018-08-16 DIAGNOSIS — M9901 Segmental and somatic dysfunction of cervical region: Secondary | ICD-10-CM | POA: Diagnosis not present

## 2018-08-16 DIAGNOSIS — M4306 Spondylolysis, lumbar region: Secondary | ICD-10-CM | POA: Diagnosis not present

## 2018-08-16 DIAGNOSIS — M40292 Other kyphosis, cervical region: Secondary | ICD-10-CM | POA: Diagnosis not present

## 2018-08-16 DIAGNOSIS — M9903 Segmental and somatic dysfunction of lumbar region: Secondary | ICD-10-CM | POA: Diagnosis not present

## 2018-08-17 ENCOUNTER — Other Ambulatory Visit: Payer: Self-pay | Admitting: Family Medicine

## 2018-08-17 DIAGNOSIS — F418 Other specified anxiety disorders: Secondary | ICD-10-CM

## 2018-08-23 DIAGNOSIS — M4306 Spondylolysis, lumbar region: Secondary | ICD-10-CM | POA: Diagnosis not present

## 2018-08-23 DIAGNOSIS — M40292 Other kyphosis, cervical region: Secondary | ICD-10-CM | POA: Diagnosis not present

## 2018-08-23 DIAGNOSIS — M9901 Segmental and somatic dysfunction of cervical region: Secondary | ICD-10-CM | POA: Diagnosis not present

## 2018-08-23 DIAGNOSIS — M9903 Segmental and somatic dysfunction of lumbar region: Secondary | ICD-10-CM | POA: Diagnosis not present

## 2018-08-26 DIAGNOSIS — M9901 Segmental and somatic dysfunction of cervical region: Secondary | ICD-10-CM | POA: Diagnosis not present

## 2018-08-26 DIAGNOSIS — M40292 Other kyphosis, cervical region: Secondary | ICD-10-CM | POA: Diagnosis not present

## 2018-08-26 DIAGNOSIS — M9903 Segmental and somatic dysfunction of lumbar region: Secondary | ICD-10-CM | POA: Diagnosis not present

## 2018-08-26 DIAGNOSIS — M4306 Spondylolysis, lumbar region: Secondary | ICD-10-CM | POA: Diagnosis not present

## 2018-08-29 DIAGNOSIS — M4306 Spondylolysis, lumbar region: Secondary | ICD-10-CM | POA: Diagnosis not present

## 2018-08-29 DIAGNOSIS — M40292 Other kyphosis, cervical region: Secondary | ICD-10-CM | POA: Diagnosis not present

## 2018-08-29 DIAGNOSIS — M9901 Segmental and somatic dysfunction of cervical region: Secondary | ICD-10-CM | POA: Diagnosis not present

## 2018-08-29 DIAGNOSIS — M9903 Segmental and somatic dysfunction of lumbar region: Secondary | ICD-10-CM | POA: Diagnosis not present

## 2018-08-31 DIAGNOSIS — M9903 Segmental and somatic dysfunction of lumbar region: Secondary | ICD-10-CM | POA: Diagnosis not present

## 2018-08-31 DIAGNOSIS — M4306 Spondylolysis, lumbar region: Secondary | ICD-10-CM | POA: Diagnosis not present

## 2018-08-31 DIAGNOSIS — M40292 Other kyphosis, cervical region: Secondary | ICD-10-CM | POA: Diagnosis not present

## 2018-08-31 DIAGNOSIS — M9901 Segmental and somatic dysfunction of cervical region: Secondary | ICD-10-CM | POA: Diagnosis not present

## 2018-09-02 ENCOUNTER — Other Ambulatory Visit: Payer: Self-pay | Admitting: Family Medicine

## 2018-09-02 DIAGNOSIS — E063 Autoimmune thyroiditis: Secondary | ICD-10-CM

## 2018-09-02 DIAGNOSIS — M9903 Segmental and somatic dysfunction of lumbar region: Secondary | ICD-10-CM | POA: Diagnosis not present

## 2018-09-02 DIAGNOSIS — I10 Essential (primary) hypertension: Secondary | ICD-10-CM

## 2018-09-02 DIAGNOSIS — E1169 Type 2 diabetes mellitus with other specified complication: Secondary | ICD-10-CM

## 2018-09-02 DIAGNOSIS — E1122 Type 2 diabetes mellitus with diabetic chronic kidney disease: Secondary | ICD-10-CM

## 2018-09-02 DIAGNOSIS — N183 Chronic kidney disease, stage 3 unspecified: Secondary | ICD-10-CM

## 2018-09-02 DIAGNOSIS — E1121 Type 2 diabetes mellitus with diabetic nephropathy: Secondary | ICD-10-CM

## 2018-09-02 DIAGNOSIS — D631 Anemia in chronic kidney disease: Secondary | ICD-10-CM

## 2018-09-02 DIAGNOSIS — E038 Other specified hypothyroidism: Secondary | ICD-10-CM

## 2018-09-02 DIAGNOSIS — M4306 Spondylolysis, lumbar region: Secondary | ICD-10-CM | POA: Diagnosis not present

## 2018-09-02 DIAGNOSIS — M40292 Other kyphosis, cervical region: Secondary | ICD-10-CM | POA: Diagnosis not present

## 2018-09-02 DIAGNOSIS — Z0000 Encounter for general adult medical examination without abnormal findings: Secondary | ICD-10-CM

## 2018-09-02 DIAGNOSIS — M9901 Segmental and somatic dysfunction of cervical region: Secondary | ICD-10-CM | POA: Diagnosis not present

## 2018-09-02 DIAGNOSIS — E785 Hyperlipidemia, unspecified: Secondary | ICD-10-CM

## 2018-09-05 ENCOUNTER — Other Ambulatory Visit: Payer: PPO

## 2018-09-05 DIAGNOSIS — M4306 Spondylolysis, lumbar region: Secondary | ICD-10-CM | POA: Diagnosis not present

## 2018-09-05 DIAGNOSIS — E1121 Type 2 diabetes mellitus with diabetic nephropathy: Secondary | ICD-10-CM | POA: Diagnosis not present

## 2018-09-05 DIAGNOSIS — E038 Other specified hypothyroidism: Secondary | ICD-10-CM | POA: Diagnosis not present

## 2018-09-05 DIAGNOSIS — E1122 Type 2 diabetes mellitus with diabetic chronic kidney disease: Secondary | ICD-10-CM | POA: Diagnosis not present

## 2018-09-05 DIAGNOSIS — E1169 Type 2 diabetes mellitus with other specified complication: Secondary | ICD-10-CM | POA: Diagnosis not present

## 2018-09-05 DIAGNOSIS — M9903 Segmental and somatic dysfunction of lumbar region: Secondary | ICD-10-CM | POA: Diagnosis not present

## 2018-09-05 DIAGNOSIS — M40292 Other kyphosis, cervical region: Secondary | ICD-10-CM | POA: Diagnosis not present

## 2018-09-05 DIAGNOSIS — M9901 Segmental and somatic dysfunction of cervical region: Secondary | ICD-10-CM | POA: Diagnosis not present

## 2018-09-06 ENCOUNTER — Other Ambulatory Visit: Payer: Self-pay

## 2018-09-06 ENCOUNTER — Ambulatory Visit
Admission: RE | Admit: 2018-09-06 | Discharge: 2018-09-06 | Disposition: A | Discharge status: Home / Self Care | Payer: PPO | Source: Ambulatory Visit | Attending: Family Medicine | Admitting: Family Medicine

## 2018-09-06 DIAGNOSIS — Z1231 Encounter for screening mammogram for malignant neoplasm of breast: Secondary | ICD-10-CM

## 2018-09-06 DIAGNOSIS — R928 Other abnormal and inconclusive findings on diagnostic imaging of breast: Secondary | ICD-10-CM | POA: Insufficient documentation

## 2018-09-06 DIAGNOSIS — R921 Mammographic calcification found on diagnostic imaging of breast: Secondary | ICD-10-CM | POA: Diagnosis not present

## 2018-09-06 LAB — COMPLETE METABOLIC PANEL WITH GFR
AG Ratio: 2.1 (calc) (ref 1.0–2.5)
ALT: 10 U/L (ref 6–29)
AST: 14 U/L (ref 10–35)
Albumin: 4.4 g/dL (ref 3.6–5.1)
Alkaline phosphatase (APISO): 30 U/L — ABNORMAL LOW (ref 37–153)
BUN/Creatinine Ratio: 18 (calc) (ref 6–22)
BUN: 27 mg/dL — ABNORMAL HIGH (ref 7–25)
CO2: 28 mmol/L (ref 20–32)
Calcium: 10 mg/dL (ref 8.6–10.4)
Chloride: 102 mmol/L (ref 98–110)
Creat: 1.52 mg/dL — ABNORMAL HIGH (ref 0.60–0.88)
GFR, Est African American: 35 mL/min/{1.73_m2} — ABNORMAL LOW (ref >=60)
GFR, Est Non African American: 31 mL/min/{1.73_m2} — ABNORMAL LOW (ref >=60)
Globulin: 2.1 g/dL (calc) (ref 1.9–3.7)
Glucose, Bld: 105 mg/dL — ABNORMAL HIGH (ref 65–99)
Potassium: 4.8 mmol/L (ref 3.5–5.3)
Sodium: 138 mmol/L (ref 135–146)
Total Bilirubin: 0.3 mg/dL (ref 0.2–1.2)
Total Protein: 6.5 g/dL (ref 6.1–8.1)

## 2018-09-06 LAB — TSH: TSH: 3.2 mIU/L (ref 0.40–4.50)

## 2018-09-06 LAB — HEMOGLOBIN A1C
Hgb A1c MFr Bld: 7.2 % of total Hgb — ABNORMAL HIGH (ref <5.7)
Mean Plasma Glucose: 160 (calc)
eAG (mmol/L): 8.9 (calc)

## 2018-09-06 LAB — CBC WITH DIFFERENTIAL/PLATELET
Absolute Monocytes: 507 cells/uL (ref 200–950)
Basophils Absolute: 72 cells/uL (ref 0–200)
Basophils Relative: 1.1 %
Eosinophils Absolute: 182 cells/uL (ref 15–500)
Eosinophils Relative: 2.8 %
HCT: 33.7 % — ABNORMAL LOW (ref 35.0–45.0)
Hemoglobin: 11 g/dL — ABNORMAL LOW (ref 11.7–15.5)
Lymphs Abs: 2600 cells/uL (ref 850–3900)
MCH: 31 pg (ref 27.0–33.0)
MCHC: 32.6 g/dL (ref 32.0–36.0)
MCV: 94.9 fL (ref 80.0–100.0)
MPV: 8.8 fL (ref 7.5–12.5)
Monocytes Relative: 7.8 %
Neutro Abs: 3140 cells/uL (ref 1500–7800)
Neutrophils Relative %: 48.3 %
Platelets: 370 10*3/uL (ref 140–400)
RBC: 3.55 10*6/uL — ABNORMAL LOW (ref 3.80–5.10)
RDW: 12.4 % (ref 11.0–15.0)
Total Lymphocyte: 40 %
WBC: 6.5 10*3/uL (ref 3.8–10.8)

## 2018-09-06 LAB — LIPID PANEL
Cholesterol: 192 mg/dL (ref <200)
HDL: 63 mg/dL (ref >=50)
LDL Cholesterol (Calc): 105 mg/dL (calc) — ABNORMAL HIGH
Non-HDL Cholesterol (Calc): 129 mg/dL (calc) (ref <130)
Total CHOL/HDL Ratio: 3 (calc) (ref <5.0)
Triglycerides: 144 mg/dL (ref <150)

## 2018-09-06 LAB — T4, FREE: Free T4: 1.3 ng/dL (ref 0.8–1.8)

## 2018-09-07 DIAGNOSIS — M4306 Spondylolysis, lumbar region: Secondary | ICD-10-CM | POA: Diagnosis not present

## 2018-09-07 DIAGNOSIS — M9901 Segmental and somatic dysfunction of cervical region: Secondary | ICD-10-CM | POA: Diagnosis not present

## 2018-09-07 DIAGNOSIS — M40292 Other kyphosis, cervical region: Secondary | ICD-10-CM | POA: Diagnosis not present

## 2018-09-07 DIAGNOSIS — M9903 Segmental and somatic dysfunction of lumbar region: Secondary | ICD-10-CM | POA: Diagnosis not present

## 2018-09-12 ENCOUNTER — Other Ambulatory Visit: Payer: Self-pay

## 2018-09-12 ENCOUNTER — Encounter: Payer: Self-pay | Admitting: Family Medicine

## 2018-09-12 ENCOUNTER — Ambulatory Visit (INDEPENDENT_AMBULATORY_CARE_PROVIDER_SITE_OTHER): Payer: PPO | Admitting: Family Medicine

## 2018-09-12 VITALS — BP 125/76 | HR 76 | Resp 16 | Ht 59.0 in | Wt 130.6 lb

## 2018-09-12 DIAGNOSIS — E063 Autoimmune thyroiditis: Secondary | ICD-10-CM

## 2018-09-12 DIAGNOSIS — E1121 Type 2 diabetes mellitus with diabetic nephropathy: Secondary | ICD-10-CM | POA: Diagnosis not present

## 2018-09-12 DIAGNOSIS — F418 Other specified anxiety disorders: Secondary | ICD-10-CM | POA: Diagnosis not present

## 2018-09-12 DIAGNOSIS — E038 Other specified hypothyroidism: Secondary | ICD-10-CM

## 2018-09-12 DIAGNOSIS — Z0000 Encounter for general adult medical examination without abnormal findings: Secondary | ICD-10-CM

## 2018-09-12 DIAGNOSIS — M150 Primary generalized (osteo)arthritis: Secondary | ICD-10-CM

## 2018-09-12 DIAGNOSIS — E1122 Type 2 diabetes mellitus with diabetic chronic kidney disease: Secondary | ICD-10-CM | POA: Diagnosis not present

## 2018-09-12 DIAGNOSIS — M159 Polyosteoarthritis, unspecified: Secondary | ICD-10-CM

## 2018-09-12 DIAGNOSIS — E785 Hyperlipidemia, unspecified: Secondary | ICD-10-CM

## 2018-09-12 DIAGNOSIS — E1169 Type 2 diabetes mellitus with other specified complication: Secondary | ICD-10-CM

## 2018-09-12 DIAGNOSIS — M8949 Other hypertrophic osteoarthropathy, multiple sites: Secondary | ICD-10-CM

## 2018-09-12 DIAGNOSIS — N183 Chronic kidney disease, stage 3 (moderate): Secondary | ICD-10-CM | POA: Diagnosis not present

## 2018-09-12 NOTE — Patient Instructions (Addendum)
Thank you for coming to the office today.  Please schedule and return for a NURSE ONLY VISIT for VACCINE - Approximately 4-6 weeks - Need High Dose Flu Vaccine - need an apt for both you and Dan for this vaccine  Keep up the great work.  For dan - it was alkaline phosphatase enzyme, his was elevated, we can observe this for future, no dietary changes needed.  Please schedule a Follow-up Appointment to: Return in about 6 months (around 03/15/2019) for 6 month follow=up DM A1c.  If you have any other questions or concerns, please feel free to call the office or send a message through [place]. You may also schedule an earlier appointment if necessary.  Additionally, you may be receiving a survey about your experience at our office within a few days to 1 week by e-mail or mail. We value your feedback.  Nobie Putnam, DO [place]

## 2018-09-12 NOTE — Assessment & Plan Note (Signed)
Improved controlled cholesterol on statin and lifestyle Last lipid panel 08/2018  Plan: 1. Continue current meds - Simvastatin 40mg  daily - no change today 2. Encourage improved lifestyle - low carb/cholesterol, reduce portion size, continue improving regular exercise

## 2018-09-12 NOTE — Progress Notes (Signed)
Subjective:    Patient ID: Lisa Mills, female    DOB: 1929-10-24, 85 y.o.   MRN: [id number]  Lisa Mills is a 85 y.o. female presenting on 09/12/2018 for Annual Exam   HPI   Here for Annual Physical and Lab Review  CHRONIC DM, Type 2, with CKD-III A1c 7.2, improved from 7.4 - she had issues with episodic symptoms dizziness etc and steroid injections and had fluctuating sugar, now it has improved CBGs: - Avg90-120 (rare symptoms of hypoglycemia with some shakiness and not feeling "right"), checks CBGs1-2x daily Meds:Glimepiride 2 mg BID (half per dose of the-74m) - IN past was taken off Metformin due to CKD. Has failed Januvia Reports good compliance. Tolerating well w/o side-effects Currentlyon ACEi- last urine micro 0 (11/2016) - has since resumed ACEi low dose Lifestyle: - Diet (improved DM diet) - Exercise (walking occasionally,not regularly- limited as caregiver for husband) [facility] Admits occasional hypoglycemia Denies hypoglycemia  CHRONIC HTN: Reportshome BP is controlled Current Meds -Lisinopril 568mdaily  Hypothyroidism Labs 08/2018, normal thyroid function. Continues on levothyroxine 2588mdaily  FOLLOW-UPMixed anxiety/depression /Insomnia No new concerns, still some mixed anxiety symptoms but not endorsing lately, doing well on escitalopram still - She is less active, now she is home mostly, she is busy providing care for DanLinna Hoffstill causing her stress and anxiety as primary stressor - Prior failed Fluoxetine Denies depression  PMH - L ear tinnitus / Hearing loss  Chronic neck pain - now to chiropractor - Dr TimConan Bowens Gra[place]ainesdalemproved neck range of motion and less pain - has baclofen takes rarely PRN can cause dizziness however, not taking often  Health Maintenance: See updates on chart  [facility] 09/12/2018 09/12/2018 03/02/2018  Decreased Interest 0 0 0  Down,  Depressed, Hopeless 0 0 0  PHQ - 2 Score 0 0 0  Altered sleeping 0 - 0  Tired, decreased energy 0 - 1  Change in appetite 0 - 0  Feeling bad or failure about yourself  0 - 0  Trouble concentrating 0 - 1  Moving slowly or fidgety/restless 0 - 0  Suicidal thoughts 0 - 0  PHQ-9 Score 0 - 2  Difficult doing work/chores Not difficult at all - Not difficult at all   GAD 7 : Generalized Anxiety Score 06/06/2018 03/02/2018 12/01/2016 10/06/2016  Nervous, Anxious, on Edge (No Data) 0 0 0  Control/stop worrying - 0 1 0  Worry too much - different things - 0 0 0  Trouble relaxing - 0 0 0  Restless - 0 0 0  Easily annoyed or irritable - 0 0 0  Afraid - awful might happen - 0 0 0  Total GAD 7 Score - 0 1 0  Anxiety Difficulty - Not difficult at all Not difficult at all Not difficult at all     Past Medical History:  Diagnosis Date  . Dermatophytosis of nail   . GERD (gastroesophageal reflux disease)   . History of shingles   . Hypertension   . Keratoderma    Past Surgical History:  Procedure Laterality Date  . BREAST BIOPSY Right 09/30/2017   Affirm bx-calcs ( X clip),benign  . BREAST EXCISIONAL BIOPSY Right    neg  . cataracts    . COLONOSCOPY W/ POLYPECTOMY    . COLONOSCOPY WITH PROPOFOL N/A 02/10/2017   Procedure: COLONOSCOPY WITH PROPOFOL;  Surgeon: EllManya SilvasD;  Location: [facility] ENDOSCOPY;  Service: Endoscopy;  Laterality: N/A;  . ESOPHAGOGASTRODUODENOSCOPY (EGD) WITH PROPOFOL N/A 02/10/2017   Procedure: ESOPHAGOGASTRODUODENOSCOPY (EGD) WITH PROPOFOL;  Surgeon: Manya Silvas, MD;  Location[facility] END[facility]COPY;  Service: Endoscopy;  Laterality: N/A;  . EYE SURGERY     bilateral cataracts  . HAND SURGERY    . KIDNEY STONE SURGERY    . TONSILECTOMY, ADENOIDECTOMY, BILATERAL MYRINGOTOMY AND TUBES    . TONSILLECTOMY    . TRIGGER FINGER RELEASE     Left ring finger  . tubercular peritonitis     Social History   Socioeconomic History  . Marital status: Married    Spouse  name: Not on file  . Number of children: 2  . Years of education: Not on file  . Highest education level: Some college, no degree  Occupational History  . Not on file  Social Needs  . Financial resource strain: Not hard at all  . Food insecurity    Worry: Never true    Inability: Never true  . Transportation needs    Medical: No    Non-medical: No  Tobacco Use  . Smoking status: Never Smoker  . Smokeless tobacco: Never Used  Substance and Sexual Activity  . Alcohol use: Not Currently  . Drug use: No  . Sexual activity: Not on file  Lifestyle  . Physical activity    Days per week: 0 days    Minutes per session: 0 min  . Stress: Not at all  Relationships  . Social connections    Talks on phone: More than three times a week    Gets together: More than three times a week    Attends religious service: Never    Active member of club or organization: No    Attends meetings of clubs or organizations: Never    Relationship status: Married  . Intimate partner violence    Fear of current or ex partner: No    Emotionally abused: No    Physically abused: No    Forced sexual activity: No  Other Topics Concern  . Not on file  Social History Narrative  . Not on file   Family History  Adopted: Yes  Problem Relation Age of Onset  . Breast cancer Neg Hx    Current Outpatient Medications on File Prior to Visit  Medication Sig  . Blood Glucose Monitoring Suppl (Govan) w/Device KIT Use glucometer to check blood sugar daily.  . cholecalciferol (VITAMIN D) 1000 units tablet Take 1,000 Units by mouth daily.  Marland Kitchen escitalopram (LEXAPRO) 5 MG tablet TAKE 1 TABLET BY MOUTH ONCE DAILY WITH FOOD  . famotidine (PEPCID) 20 MG tablet TAKE 1 TABLET BY MOUTH AT BEDTIME  . glimepiride (AMARYL) 4 MG tablet Take 0.5 tablets (2 mg total) by mouth daily as needed (hyperglycemia >200-250).  Marland Kitchen glucose blood (ONE TOUCH ULTRA TEST) test strip CHECK BLOOD SUGAR UP TO 2 TIMES A DAY.  Marland Kitchen  levothyroxine (SYNTHROID) 25 MCG tablet TAKE 1 TABLET BY MOUTH ONCE DAILY ON AN EMPTY STOMACH. WAIT 30 MINUTES BEFORE TAKING OTHER MEDS.  Marland Kitchen lisinopril (PRINIVIL,ZESTRIL) 5 MG tablet TAKE 1 TABLET BY MOUTH ONCE DAILY.  Marland Kitchen omeprazole (PRILOSEC) 20 MG capsule TAKE 1 CAPSULE BY MOUTH ONCE DAILY  . ONETOUCH DELICA LANCETS 12W MISC 1 each by Does not apply route 3 (three) times daily. Dx:E11.9  . polyethylene glycol powder (MIRALAX) powder Take 17-34 g by mouth daily as needed.  . Probiotic Product (PROBIOTIC-10 PO)  Take by mouth.  . simvastatin (ZOCOR) 40 MG tablet TAKE 1 TABLET BY MOUTH AT BEDTIME.  Marland Kitchen Biotin 1000 MCG tablet Take by mouth.  . bisacodyl (DULCOLAX) 5 MG EC tablet Take by mouth.   No current facility-administered medications on file prior to visit.     Review of Systems  Constitutional: Negative for activity change, appetite change, chills, diaphoresis, fatigue and fever.  HENT: Negative for congestion and hearing loss.   Eyes: Negative for visual disturbance.  Respiratory: Negative for apnea, cough, chest tightness, shortness of breath and wheezing.   Cardiovascular: Negative for chest pain, palpitations and leg swelling.  Gastrointestinal: Negative for abdominal pain, anal bleeding, blood in stool, constipation, diarrhea, nausea and vomiting.  Endocrine: Negative for cold intolerance.  Genitourinary: Negative for difficulty urinating, dysuria, frequency and hematuria.  Musculoskeletal: Negative for arthralgias, back pain and neck pain.  Skin: Negative for rash.  Allergic/Immunologic: Negative for environmental allergies.  Neurological: Negative for dizziness, weakness, light-headedness, numbness and headaches.  Hematological: Negative for adenopathy.  Psychiatric/Behavioral: Negative for behavioral problems, dysphoric mood and sleep disturbance. The patient is not nervous/anxious.    Per HPI unless specifically indicated above       Objective:    BP 125/76   Pulse 76    Resp 16   Ht 4' 11" (1.499 m)   Wt 130 lb 9.6 oz (59.2 kg)   SpO2 100%   BMI 26.38 kg/m   Wt Readings from Last 3 Encounters:  09/12/18 130 lb 9.6 oz (59.2 kg)  06/06/18 130 lb (59 kg)  03/02/18 133 lb 9.6 oz (60.6 kg)    Physical Exam Vitals signs and nursing note reviewed.  Constitutional:      General: She is not in acute distress.    Appearance: She is well-developed. She is not diaphoretic.     Comments: Well-appearing, comfortable, cooperative  HENT:     Head: Normocephalic and atraumatic.  Eyes:     General:        Right eye: No discharge.        Left eye: No discharge.     Conjunctiva/sclera: Conjunctivae normal.     Pupils: Pupils are equal, round, and reactive to light.  Neck:     Musculoskeletal: Normal range of motion and neck supple.     Thyroid: No thyromegaly.  Cardiovascular:     Rate and Rhythm: Normal rate and regular rhythm.     Heart sounds: Normal heart sounds. No murmur.  Pulmonary:     Effort: Pulmonary effort is normal. No respiratory distress.     Breath sounds: Normal breath sounds. No wheezing or rales.  Abdominal:     General: Bowel sounds are normal. There is no distension.     Palpations: Abdomen is soft. There is no mass.     Tenderness: There is no abdominal tenderness.  Musculoskeletal: Normal range of motion.        General: No tenderness.     Comments: Upper / Lower Extremities: - Normal muscle tone, strength bilateral upper extremities 5/5, lower extremities 5/5  Lymphadenopathy:     Cervical: No cervical adenopathy.  Skin:    General: Skin is warm and dry.     Findings: No erythema or rash.  Neurological:     Mental Status: She is alert and oriented to person, place, and time.     Comments: Distal sensation intact to light touch all extremities  Psychiatric:        Behavior: Behavior normal.  Comments: Well groomed, good eye contact, normal speech and thoughts      Diabetic Foot Exam - Simple   Simple Foot Form Diabetic  Foot exam was performed with the following findings: Yes 09/12/2018  2:46 PM  Visual Inspection No deformities, no ulcerations, no other skin breakdown bilaterally: Yes Sensation Testing Intact to touch and monofilament testing bilaterally: Yes Pulse Check Posterior Tibialis and Dorsalis pulse intact bilaterally: Yes Comments      Results for orders placed or performed in visit on 09/05/18  T4, free  Result Value Ref Range   Free T4 1.3 0.8 - 1.8 ng/dL  TSH  Result Value Ref Range   TSH 3.20 0.40 - 4.50 mIU/L  Lipid panel  Result Value Ref Range   Cholesterol 192 <200 mg/dL   HDL 63 > OR = 50 mg/dL   Triglycerides 144 <150 mg/dL   LDL Cholesterol (Calc) 105 (H) mg/dL (calc)   Total CHOL/HDL Ratio 3.0 <5.0 (calc)   Non-HDL Cholesterol (Calc) 129 <130 mg/dL (calc)  COMPLETE METABOLIC PANEL WITH GFR  Result Value Ref Range   Glucose, Bld 105 (H) 65 - 99 mg/dL   BUN 27 (H) 7 - 25 mg/dL   Creat 1.52 (H) 0.60 - 0.88 mg/dL   GFR, Est Non African American 31 (L) > OR = 60 mL/min/1.81m   GFR, Est African American 35 (L) > OR = 60 mL/min/1.761m  BUN/Creatinine Ratio 18 6 - 22 (calc)   Sodium 138 135 - 146 mmol/L   Potassium 4.8 3.5 - 5.3 mmol/L   Chloride 102 98 - 110 mmol/L   CO2 28 20 - 32 mmol/L   Calcium 10.0 8.6 - 10.4 mg/dL   Total Protein 6.5 6.1 - 8.1 g/dL   Albumin 4.4 3.6 - 5.1 g/dL   Globulin 2.1 1.9 - 3.7 g/dL (calc)   AG Ratio 2.1 1.0 - 2.5 (calc)   Total Bilirubin 0.3 0.2 - 1.2 mg/dL   Alkaline phosphatase (APISO) 30 (L) 37 - 153 U/L   AST 14 10 - 35 U/L   ALT 10 6 - 29 U/L  CBC with Differential/Platelet  Result Value Ref Range   WBC 6.5 3.8 - 10.8 Thousand/uL   RBC 3.55 (L) 3.80 - 5.10 Million/uL   Hemoglobin 11.0 (L) 11.7 - 15.5 g/dL   HCT 33.7 (L) 35.0 - 45.0 %   MCV 94.9 80.0 - 100.0 fL   MCH 31.0 27.0 - 33.0 pg   MCHC 32.6 32.0 - 36.0 g/dL   RDW 12.4 11.0 - 15.0 %   Platelets 370 140 - 400 Thousand/uL   MPV 8.8 7.5 - 12.5 fL   Neutro Abs 3,140  1,500 - 7,800 cells/uL   Lymphs Abs 2,600 850 - 3,900 cells/uL   Absolute Monocytes 507 200 - 950 cells/uL   Eosinophils Absolute 182 15 - 500 cells/uL   Basophils Absolute 72 0 - 200 cells/uL   Neutrophils Relative % 48.3 %   Total Lymphocyte 40.0 %   Monocytes Relative 7.8 %   Eosinophils Relative 2.8 %   Basophils Relative 1.1 %  Hemoglobin A1c  Result Value Ref Range   Hgb A1c MFr Bld 7.2 (H) <5.7 % of total Hgb   Mean Plasma Glucose 160 (calc)   eAG (mmol/L) 8.9 (calc)      Assessment & Plan:   Problem List Items Addressed This Visit    Anxiety associated with depression    Stable Chronic caregiver stress Improved on SSRI lexapro still  CKD stage 3 due to type 2 diabetes mellitus (HCC)    CKD-III, secondary to HTN DM age Followed by Nephrology [facility] Dr Candiss Norse  Plan: 1. Continue ACEi Lisinopril low dose 658m Improve hydration Follow-up      Controlled type 2 diabetes mellitus with diabetic nephropathy (HCC)    Improved AN3Ito 7.2 Complications - at risk hypoglycemia, nephropathy with CKD III-IV, also in setting HTN - Contraindicated - SGLT2. H/o failed Glyburide, Januvia. Not interested in injectable - Off metformin in past due to CKD - off januvia ineffective  Plan:  1. Continue Glimepiride 235mBID (half of 58m22mab) - despite previous instructions due to higher sugars - use caution still 2. Cannot take ASA due to CKD, continue statin, on ACEi 3. Encouraged lifestyle improvements with DM diet, continue walking      Hyperlipidemia associated with type 2 diabetes mellitus ([facility]  Improved controlled cholesterol on statin and lifestyle Last lipid panel 08/2018  Plan: 1. Continue current meds - Simvastatin 57m66mily - no change today 2. Encourage improved lifestyle - low carb/cholesterol, reduce portion size, continue improving regular exercise      Hypothyroidism    Stable Last TSH normal 08/2018 Continue current Levothyroxine 25mc12mily       Osteoarthritis of multiple joints    Other Visit Diagnoses    Annual physical exam    -  Primary      Updated Health Maintenance information - due flu vaccine, high dose, return with husband in October for it Reviewed recent lab results with patient Encouraged improvement to lifestyle with diet and exercise -maintain healthy weight   No orders of the defined types were placed in this encounter.   Follow up plan: Return in about 6 months (around 03/15/2019) for 6 month follow=up DM A1c.  AlexaNobie PutnamSo[place]bridgep 09/12/2018, 2:20 PM

## 2018-09-12 NOTE — Assessment & Plan Note (Signed)
Stable Chronic caregiver stress Improved on SSRI lexapro still

## 2018-09-12 NOTE — Assessment & Plan Note (Signed)
Improved F1M to 7.2 Complications - at risk hypoglycemia, nephropathy with CKD III-IV, also in setting HTN - Contraindicated - SGLT2. H/o failed Glyburide, Januvia. Not interested in injectable - Off metformin in past due to CKD - off januvia ineffective  Plan:  1. Continue Glimepiride 2mg  BID (half of 4mg  tab) - despite previous instructions due to higher sugars - use caution still 2. Cannot take ASA due to CKD, continue statin, on ACEi 3. Encouraged lifestyle improvements with DM diet, continue walking

## 2018-09-12 NOTE — Assessment & Plan Note (Signed)
CKD-III, secondary to HTN DM age Followed by Nephrology [facility] Dr Candiss Norse  Plan: 1. Continue ACEi Lisinopril low dose 5mg  Improve hydration Follow-up

## 2018-09-12 NOTE — Assessment & Plan Note (Signed)
Stable Last TSH normal 08/2018 Continue current Levothyroxine 110mcg daily

## 2018-09-13 DIAGNOSIS — M4306 Spondylolysis, lumbar region: Secondary | ICD-10-CM | POA: Diagnosis not present

## 2018-09-13 DIAGNOSIS — M9901 Segmental and somatic dysfunction of cervical region: Secondary | ICD-10-CM | POA: Diagnosis not present

## 2018-09-13 DIAGNOSIS — M40292 Other kyphosis, cervical region: Secondary | ICD-10-CM | POA: Diagnosis not present

## 2018-09-13 DIAGNOSIS — M9903 Segmental and somatic dysfunction of lumbar region: Secondary | ICD-10-CM | POA: Diagnosis not present

## 2018-09-27 DIAGNOSIS — M9901 Segmental and somatic dysfunction of cervical region: Secondary | ICD-10-CM | POA: Diagnosis not present

## 2018-09-27 DIAGNOSIS — M9903 Segmental and somatic dysfunction of lumbar region: Secondary | ICD-10-CM | POA: Diagnosis not present

## 2018-09-27 DIAGNOSIS — M4306 Spondylolysis, lumbar region: Secondary | ICD-10-CM | POA: Diagnosis not present

## 2018-09-27 DIAGNOSIS — M40292 Other kyphosis, cervical region: Secondary | ICD-10-CM | POA: Diagnosis not present

## 2018-10-12 DIAGNOSIS — L821 Other seborrheic keratosis: Secondary | ICD-10-CM | POA: Diagnosis not present

## 2018-10-12 DIAGNOSIS — L570 Actinic keratosis: Secondary | ICD-10-CM | POA: Diagnosis not present

## 2018-10-12 DIAGNOSIS — Z1283 Encounter for screening for malignant neoplasm of skin: Secondary | ICD-10-CM | POA: Diagnosis not present

## 2018-10-12 DIAGNOSIS — Z85828 Personal history of other malignant neoplasm of skin: Secondary | ICD-10-CM | POA: Diagnosis not present

## 2018-10-12 DIAGNOSIS — L578 Other skin changes due to chronic exposure to nonionizing radiation: Secondary | ICD-10-CM | POA: Diagnosis not present

## 2018-10-12 DIAGNOSIS — L820 Inflamed seborrheic keratosis: Secondary | ICD-10-CM | POA: Diagnosis not present

## 2018-10-17 ENCOUNTER — Other Ambulatory Visit: Payer: Self-pay | Admitting: Family Medicine

## 2018-10-17 DIAGNOSIS — E1121 Type 2 diabetes mellitus with diabetic nephropathy: Secondary | ICD-10-CM

## 2018-10-18 ENCOUNTER — Telehealth: Payer: Self-pay | Admitting: Family Medicine

## 2018-10-18 ENCOUNTER — Telehealth: Payer: Self-pay

## 2018-10-18 NOTE — Telephone Encounter (Signed)
The pt was notified, no questions or concerns.

## 2018-10-18 NOTE — Telephone Encounter (Signed)
I left a message asking the patient to call and schedule AWV with Tiffany. VDM (DD)

## 2018-10-18 NOTE — Telephone Encounter (Signed)
Patients Ortho doctor called her in prednisone yesterday due to hip pain.  He BS today is 252.  She was wondering if she should take double of her DM meds.  Please advise

## 2018-10-18 NOTE — Telephone Encounter (Signed)
Unfortunately Prednisone is going to make her blood sugar spike up - no matter what dose diabetic med she takes.  Please let her know that she should NOT take double diabetic med.  I am comfortable with her having elevated blood sugar for short term only - about 1-2 weeks. If blood sugar is 150 to 350 that is okay short term.  It would not be safe to take double diabetic med because the blood sugar from prednisone can also drop quickly - and then that would cause her to have a severe low blood sugar that could be dangerous.  Nobie Putnam, [facility] 10/18/2018, 9:46 AM

## 2018-10-24 ENCOUNTER — Telehealth: Payer: Self-pay | Admitting: Family Medicine

## 2018-10-24 NOTE — Chronic Care Management (AMB) (Signed)
°  Chronic Care Management   Outreach Note  10/24/2018 Name: Lisa Mills MRN: [id number] DOB: 1929/08/16  Referred by: Olin Hauser, DO Reason for referral : Chronic Care Management (Initial CCM outreach was unsuccessful. )   An unsuccessful telephone outreach was attempted today. The patient was referred to the case management team by for assistance with care management and care coordination.   Follow Up Plan: A HIPPA compliant phone message was left for the patient providing contact information and requesting a return call.  The care management team will reach out to the patient again over the next 7 days.  If patient returns call to provider office, please advise to call [place] [facility] [place]  [email or web address]   ??[phone]

## 2018-10-31 NOTE — Chronic Care Management (AMB) (Signed)
Chronic Care Management   Note  10/31/2018 Name: Lisa Mills MRN: [id number] DOB: 09/26/1929  Lisa Mills is a 85 y.o. year old female who is a primary care patient of Olin Hauser, DO. I reached out to Raenette Rover by phone today in response to a referral sent by [name]'s health plan.     [name] was given information about Chronic Care Management services today including:  1. CCM service includes personalized support from designated clinical staff supervised by her physician, including individualized plan of care and coordination with other care providers 2. 24/7 contact phone numbers for assistance for urgent and routine care needs. 3. Service will only be billed when office clinical staff spend 20 minutes or more in a month to coordinate care. 4. Only one practitioner may furnish and bill the service in a calendar month. 5. The patient may stop CCM services at any time (effective at the end of the month) by phone call to the office staff. 6. The patient will be responsible for cost sharing (co-pay) of up to 20% of the service fee (after annual deductible is met).  Patient did not agree to enrollment in care management services and does not wish to consider at this time.  Follow up plan: The patient has been provided with contact information for the chronic care management team and has been advised to call with any health related questions or concerns.   [place]  [email or web address]   ??[phone]

## 2018-11-01 ENCOUNTER — Ambulatory Visit: Payer: PPO

## 2018-11-01 ENCOUNTER — Ambulatory Visit (INDEPENDENT_AMBULATORY_CARE_PROVIDER_SITE_OTHER): Payer: PPO

## 2018-11-01 ENCOUNTER — Other Ambulatory Visit: Payer: Self-pay

## 2018-11-01 VITALS — BP 117/55 | HR 66 | Temp 98.4°F | Resp 15 | Ht 59.0 in | Wt 132.4 lb

## 2018-11-01 DIAGNOSIS — Z0000 Encounter for general adult medical examination without abnormal findings: Secondary | ICD-10-CM

## 2018-11-01 DIAGNOSIS — Z23 Encounter for immunization: Secondary | ICD-10-CM | POA: Diagnosis not present

## 2018-11-01 NOTE — Progress Notes (Signed)
Subjective:   Lisa Mills is a 85 y.o. female who presents for Medicare Annual (Subsequent) preventive examination.  Review of Systems:   Cardiac Risk Factors include: advanced age (>4mn, >>36women);diabetes mellitus;dyslipidemia;hypertension     Objective:     Vitals: BP (!) 117/55 (BP Location: Left Arm, Patient Position: Sitting, Cuff Size: Normal)   Pulse 66   Temp 98.4 F (36.9 C) (Oral)   Resp 15   Ht '4\' 11"'$  (1.499 m)   Wt 132 lb 6.4 oz (60.1 kg)   SpO2 98%   BMI 26.74 kg/m   Body mass index is 26.74 kg/m.  Advanced Directives 10/19/2017 02/10/2017 11/03/2016  Does Patient Have a Medical Advance Directive? Yes Yes Yes  Type of [place] -  [place]? Yes Yes -    Tobacco Social History   Tobacco Use  Smoking Status Never Smoker  Smokeless Tobacco Never Used     Counseling given: Not Answered   Clinical Intake:  Pre-visit preparation completed: Yes  Pain : 0-10 Pain Score: 5  Pain Type: Chronic pain Pain Location: Shoulder Pain Orientation: Right, Left Pain Descriptors / Indicators: Aching Pain Onset: More than a month ago Pain Frequency: Intermittent     Nutritional Status: BMI 25 -29 Overweight Nutritional Risks: None Diabetes: Yes CBG done?: No Did pt. bring in CBG monitor from home?: No  How often do you need to have someone help you when you read instructions, pamphlets, or other written materials from your doctor or pharmacy?: 1 - Never  Nutrition Risk Assessment:  Has the patient had any N/V/D within the last 2 months?  No  Does the patient have any non-healing wounds?  No  Has the patient had any unintentional weight loss or weight gain?  No   Diabetes:  Is the patient diabetic?  Yes  If diabetic, was a CBG obtained today?  No  Did the patient bring in their glucometer from home?  No  How often do  you monitor your CBG's? Twice daily   Financial Strains and Diabetes Management:  Are you having any financial strains with the device, your supplies or your medication? No .  Does the patient want to be seen by Chronic Care Management for management of their diabetes? No  Would the patient like to be referred to a Nutritionist or for Diabetic Management?  No   Diabetic Exams:  Diabetic Eye Exam: Completed 09/23/2017 Overdue for diabetic eye exam. Pt has been advised about the importance in completing this exam. Patient will schedule an appt.   Diabetic Foot Exam: Completed 09/12/2018   Interpreter Needed?: No  Information entered by :: Tiffany Hill,LPN  Past Medical History:  Diagnosis Date  . Dermatophytosis of nail   . GERD (gastroesophageal reflux disease)   . History of shingles   . Hypertension   . Keratoderma    Past Surgical History:  Procedure Laterality Date  . BREAST BIOPSY Right 09/30/2017   Affirm bx-calcs ( X clip),benign  . BREAST EXCISIONAL BIOPSY Right    neg  . cataracts    . COLONOSCOPY W/ POLYPECTOMY    . COLONOSCOPY WITH PROPOFOL N/A 02/10/2017   Procedure: COLONOSCOPY WITH PROPOFOL;  Surgeon: EManya Silvas MD;  [facility];  Service: Endoscopy;  Laterality: N/A;  . ESOPHAGOGASTRODUODENOSCOPY (EGD) WITH PROPOFOL N/A 02/10/2017   Procedure: ESOPHAGOGASTRODUODENOSCOPY (EGD) WITH PROPOFOL;  Surgeon: EManya Silvas  MD;  Location: [facility] ENDOSCOPY;  Service: Endoscopy;  Laterality: N/A;  . EYE SURGERY     bilateral cataracts  . HAND SURGERY    . KIDNEY STONE SURGERY    . TONSILECTOMY, ADENOIDECTOMY, BILATERAL MYRINGOTOMY AND TUBES    . TONSILLECTOMY    . TRIGGER FINGER RELEASE     Left ring finger  . tubercular peritonitis     Family History  Adopted: Yes  Problem Relation Age of Onset  . Breast cancer Neg Hx    Social History   Socioeconomic History  . Marital status: Married    Spouse name: Not on file  . Number of children:  2  . Years of education: Not on file  . Highest education level: Some college, no degree  Occupational History  . Not on file  Social Needs  . Financial resource strain: Not hard at all  . Food insecurity    Worry: Never true    Inability: Never true  . Transportation needs    Medical: No    Non-medical: No  Tobacco Use  . Smoking status: Never Smoker  . Smokeless tobacco: Never Used  Substance and Sexual Activity  . Alcohol use: Not Currently  . Drug use: No  . Sexual activity: Not on file  Lifestyle  . Physical activity    Days per week: 0 days    Minutes per session: 0 min  . Stress: Not at all  Relationships  . Social connections    Talks on phone: More than three times a week    Gets together: More than three times a week    Attends religious service: Never    Active member of club or organization: No    Attends meetings of clubs or organizations: Never    Relationship status: Married  Other Topics Concern  . Not on file  Social History Narrative  . Not on file    Outpatient Encounter Medications as of 11/01/2018  Medication Sig  . Biotin 1000 MCG tablet Take by mouth.  . Blood Glucose Monitoring Suppl ([place]) w/Device KIT Use glucometer to check blood sugar daily.  . cholecalciferol (VITAMIN D) 1000 units tablet Take 1,000 Units by mouth daily.  Marland Kitchen escitalopram (LEXAPRO) 5 MG tablet TAKE 1 TABLET BY MOUTH ONCE DAILY WITH FOOD  . glimepiride (AMARYL) 4 MG tablet TAKE 1/2 TO 1 TABLET BY MOUTH ONCE DAILYBEFORE BREAKFAST  . glucose blood (ONE TOUCH ULTRA TEST) test strip CHECK BLOOD SUGAR UP TO 2 TIMES A DAY.  Marland Kitchen levothyroxine (SYNTHROID) 25 MCG tablet TAKE 1 TABLET BY MOUTH ONCE DAILY ON AN EMPTY STOMACH. WAIT 30 MINUTES BEFORE TAKING OTHER MEDS.  Marland Kitchen lisinopril (PRINIVIL,ZESTRIL) 5 MG tablet TAKE 1 TABLET BY MOUTH ONCE DAILY.  Marland Kitchen omeprazole (PRILOSEC) 20 MG capsule TAKE 1 CAPSULE BY MOUTH ONCE DAILY  . ONETOUCH DELICA LANCETS 75Z MISC 1 each by Does  not apply route 3 (three) times daily. Dx:E11.9  . Probiotic Product (PROBIOTIC-10 PO) Take by mouth.  . simvastatin (ZOCOR) 40 MG tablet TAKE 1 TABLET BY MOUTH AT BEDTIME.  . [DISCONTINUED] bisacodyl (DULCOLAX) 5 MG EC tablet Take by mouth.  . [DISCONTINUED] famotidine (PEPCID) 20 MG tablet TAKE 1 TABLET BY MOUTH AT BEDTIME (Patient not taking: Reported on 11/01/2018)  . [DISCONTINUED] polyethylene glycol powder (MIRALAX) powder Take 17-34 g by mouth daily as needed. (Patient not taking: Reported on 11/01/2018)   No facility-administered encounter medications on file as of 11/01/2018.  Activities of Daily Living In your present state of health, do you have any difficulty performing the following activities: 11/01/2018 09/12/2018  Hearing? Y N  Comment hearing aids -  Vision? N N  Comment no eyeglasses, [facility] -  Difficulty concentrating or making decisions? N N  Walking or climbing stairs? N N  Dressing or bathing? N N  Doing errands, shopping? N N  Preparing Food and eating ? N -  Using the Toilet? N -  In the past six months, have you accidently leaked urine? Y -  Comment mostly at nigh, leakage when trying to get to the bathroom -  Do you have problems with loss of bowel control? N -  Managing your Medications? N -  Managing your Finances? N -  Housekeeping or managing your Housekeeping? N -  Some recent data might be hidden    Patient Care Team: Olin Hauser, DO as PCP - General (Family Medicine)    Assessment:   This is a routine wellness examination for [facility].  Exercise Activities and Dietary recommendations Current Exercise Habits: Home exercise routine, Type of exercise: stretching;walking(walks dog, house work, stretches), Time (Minutes): 20, Frequency (Times/Week): 7, Weekly Exercise (Minutes/Week): 140, Intensity: Mild, Exercise limited by: None identified  Goals    . DIET - INCREASE WATER INTAKE     Recommend drinking at least 6-8  glasses of water a day        Fall Risk: Fall Risk  11/01/2018 09/12/2018 06/06/2018 10/19/2017 03/03/2017  Falls in the past year? 0 0 0 No No  Number falls in past yr: 0 0 - - -  Injury with Fall? 0 0 - - -  Follow up - - Falls evaluation completed - -    FALL RISK PREVENTION PERTAINING TO THE HOME:  Any stairs in or around the home? Yes  If so, are there any without handrails? No   Home free of loose throw rugs in walkways, pet beds, electrical cords, etc? Yes  Adequate lighting in your home to reduce risk of falls? Yes   ASSISTIVE DEVICES UTILIZED TO PREVENT FALLS:  Life alert? No  Use of a cane, walker or w/c? No  Grab bars in the bathroom? Yes  Shower chair or bench in shower? Yes  Elevated toilet seat or a handicapped toilet? No   TIMED UP AND GO:  Was the test performed? Yes .  Length of time to ambulate 10 feet: 11 sec.   GAIT:  Appearance of gait: Gait steady and fast without the use of an assistive device.  Education: Fall risk prevention has been discussed.  Intervention(s) required? No   DME/home health order needed?  No    Depression Screen PHQ 2/9 Scores 11/01/2018 09/12/2018 09/12/2018 06/06/2018  PHQ - 2 Score 0 0 0 -  PHQ- 9 Score - 0 - -  Exception Documentation - - - Patient refusal     Cognitive Function     6CIT Screen 11/01/2018 10/19/2017  What Year? 0 points 0 points  What month? 0 points 0 points  What time? 0 points 0 points  Count back from 20 0 points 0 points  Months in reverse 0 points 0 points  Repeat phrase 0 points 0 points  Total Score 0 0    Immunization History  Administered Date(s) Administered  . Fluad Quad(high Dose 65+) 11/01/2018  . Influenza, High Dose Seasonal PF 10/28/2016, 10/19/2017  . Influenza-Unspecified 10/17/2011, 11/09/2012, 10/30/2013, 11/02/2014, 11/10/2015  . Pneumococcal Conjugate-13  12/10/2012  . Pneumococcal Polysaccharide-23 08/25/2016  . Zoster Recombinat (Shingrix) 12/11/2016, 08/28/2017     Qualifies for Shingles Vaccine? Shingrix completed   Tdap: up to date   Flu Vaccine: Due for Flu vaccine. Does the patient want to receive this vaccine today?  Yes .  Done today   Pneumococcal Vaccine: up to date   Screening Tests Health Maintenance  Topic Date Due  . INFLUENZA VACCINE  08/20/2018  . OPHTHALMOLOGY EXAM  09/24/2018  . HEMOGLOBIN A1C  03/08/2019  . FOOT EXAM  09/12/2019  . TETANUS/TDAP  09/25/2020  . DEXA SCAN  Completed  . PNA vac Low Risk Adult  Completed    Cancer Screenings:  Colorectal Screening: no longer required   Mammogram: Completed 09/06/2018.  Bone Density: completed 2018  Lung Cancer Screening: (Low Dose CT Chest recommended if Age 36-80 years, 30 pack-year currently smoking OR have quit w/in 15years.) does not qualify.     Additional Screening:  Hepatitis C Screening: does not qualify  Dental Screening: Recommended annual dental exams for proper oral hygiene   Community Resource Referral:  CRR required this visit?  No       Plan:  I have personally reviewed and addressed the Medicare Annual Wellness questionnaire and have noted the following in the patient's chart:  A. Medical and social history B. Use of alcohol, tobacco or illicit drugs  C. Current medications and supplements D. Functional ability and status E.  Nutritional status F.  Physical activity G. Advance directives H. List of other physicians I.  Hospitalizations, surgeries, and ER visits in previous 12 months J.  [place] such as hearing and vision if needed, cognitive and depression L. Referrals and appointments   In addition, I have reviewed and discussed with patient certain preventive protocols, quality metrics, and best practice recommendations. A written personalized care plan for preventive services as well as general preventive health recommendations were provided to patient.   Signed,    Bevelyn Ngo, LPN  [id number] Nurse Health Advisor    Nurse Notes: none

## 2018-11-01 NOTE — Patient Instructions (Signed)
[name] , Thank you for taking time to come for your Medicare Wellness Visit. I appreciate your ongoing commitment to your health goals. Please review the following plan we discussed and let me know if I can assist you in the future.   Screening recommendations/referrals: Colonoscopy: no longer required  Mammogram: up to date  Bone Density: up to date Recommended yearly ophthalmology/optometry visit for glaucoma screening and checkup Recommended yearly dental visit for hygiene and checkup  Vaccinations: Influenza vaccine: done today Pneumococcal vaccine: up to date  Tdap vaccine: up to date  Shingles vaccine: shingrix completed   Advanced directives: copy on file   Conditions/risks identified: discussed chronic care management team  Next appointment: Follow up in one year for your annual wellness visit   Preventive Care 83 Years and Older, Female Preventive care refers to lifestyle choices and visits with your health care provider that can promote health and wellness. What does preventive care include?  A yearly physical exam. This is also called an annual well check.  Dental exams once or twice a year.  Routine eye exams. Ask your health care provider how often you should have your eyes checked.  Personal lifestyle choices, including:  Daily care of your teeth and gums.  Regular physical activity.  Eating a healthy diet.  Avoiding tobacco and drug use.  Limiting alcohol use.  Practicing safe sex.  Taking low-dose aspirin every day.  Taking vitamin and mineral supplements as recommended by your health care provider. What happens during an annual well check? The services and screenings done by your health care provider during your annual well check will depend on your age, overall health, lifestyle risk factors, and family history of disease. Counseling  Your health care provider may ask you questions about your:  Alcohol use.  Tobacco use.  Drug use.   Emotional well-being.  Home and relationship well-being.  Sexual activity.  Eating habits.  History of falls.  Memory and ability to understand (cognition).  Work and work Statistician.  Reproductive health. Screening  You may have the following tests or measurements:  Height, weight, and BMI.  Blood pressure.  Lipid and cholesterol levels. These may be checked every 5 years, or more frequently if you are over 77 years old.  Skin check.  Lung cancer screening. You may have this screening every year starting at age 83 if you have a 30-pack-year history of smoking and currently smoke or have quit within the past 15 years.  Fecal occult blood test (FOBT) of the stool. You may have this test every year starting at age 83.  Flexible sigmoidoscopy or colonoscopy. You may have a sigmoidoscopy every 5 years or a colonoscopy every 10 years starting at age 83.  Hepatitis C blood test.  Hepatitis B blood test.  Sexually transmitted disease (STD) testing.  Diabetes screening. This is done by checking your blood sugar (glucose) after you have not eaten for a while (fasting). You may have this done every 1-3 years.  Bone density scan. This is done to screen for osteoporosis. You may have this done starting at age 83.  Mammogram. This may be done every 1-2 years. Talk to your health care provider about how often you should have regular mammograms. Talk with your health care provider about your test results, treatment options, and if necessary, the need for more tests. Vaccines  Your health care provider may recommend certain vaccines, such as:  Influenza vaccine. This is recommended every year.  Tetanus, diphtheria, and  acellular pertussis (Tdap, Td) vaccine. You may need a Td booster every 10 years.  Zoster vaccine. You may need this after age 49.  Pneumococcal 13-valent conjugate (PCV13) vaccine. One dose is recommended after age 32.  Pneumococcal polysaccharide (PPSV23)  vaccine. One dose is recommended after age 6. Talk to your health care provider about which screenings and vaccines you need and how often you need them. This information is not intended to replace advice given to you by your health care provider. Make sure you discuss any questions you have with your health care provider. Document Released: 02/01/2015 Document Revised: 09/25/2015 Document Reviewed: 11/06/2014 Elsevier Interactive Patient Education  2017 [place] Prevention in the Home Falls can cause injuries. They can happen to people of all ages. There are many things you can do to make your home safe and to help prevent falls. What can I do on the outside of my home?  Regularly fix the edges of walkways and driveways and fix any cracks.  Remove anything that might make you trip as you walk through a door, such as a raised step or threshold.  Trim any bushes or trees on the path to your home.  Use bright outdoor lighting.  Clear any walking paths of anything that might make someone trip, such as rocks or tools.  Regularly check to see if handrails are loose or broken. Make sure that both sides of any steps have handrails.  Any raised decks and porches should have guardrails on the edges.  Have any leaves, snow, or ice cleared regularly.  Use sand or salt on walking paths during winter.  Clean up any spills in your garage right away. This includes oil or grease spills. What can I do in the bathroom?  Use night lights.  Install grab bars by the toilet and in the tub and shower. Do not use towel bars as grab bars.  Use non-skid mats or decals in the tub or shower.  If you need to sit down in the shower, use a plastic, non-slip stool.  Keep the floor dry. Clean up any water that spills on the floor as soon as it happens.  Remove soap buildup in the tub or shower regularly.  Attach bath mats securely with double-sided non-slip rug tape.  Do not have throw rugs  and other things on the floor that can make you trip. What can I do in the bedroom?  Use night lights.  Make sure that you have a light by your bed that is easy to reach.  Do not use any sheets or blankets that are too big for your bed. They should not hang down onto the floor.  Have a firm chair that has side arms. You can use this for support while you get dressed.  Do not have throw rugs and other things on the floor that can make you trip. What can I do in the kitchen?  Clean up any spills right away.  Avoid walking on wet floors.  Keep items that you use a lot in easy-to-reach places.  If you need to reach something above you, use a strong step stool that has a grab bar.  Keep electrical cords out of the way.  Do not use floor polish or wax that makes floors slippery. If you must use wax, use non-skid floor wax.  Do not have throw rugs and other things on the floor that can make you trip. What can I do with my stairs?  Do not leave any items on the stairs.  Make sure that there are handrails on both sides of the stairs and use them. Fix handrails that are broken or loose. Make sure that handrails are as long as the stairways.  Check any carpeting to make sure that it is firmly attached to the stairs. Fix any carpet that is loose or worn.  Avoid having throw rugs at the top or bottom of the stairs. If you do have throw rugs, attach them to the floor with carpet tape.  Make sure that you have a light switch at the top of the stairs and the bottom of the stairs. If you do not have them, ask someone to add them for you. What else can I do to help prevent falls?  Wear shoes that:  Do not have high heels.  Have rubber bottoms.  Are comfortable and fit you well.  Are closed at the toe. Do not wear sandals.  If you use a stepladder:  Make sure that it is fully opened. Do not climb a closed stepladder.  Make sure that both sides of the stepladder are locked into  place.  Ask someone to hold it for you, if possible.  Clearly mark and make sure that you can see:  Any grab bars or handrails.  First and last steps.  Where the edge of each step is.  Use tools that help you move around (mobility aids) if they are needed. These include:  Canes.  Walkers.  Scooters.  Crutches.  Turn on the lights when you go into a dark area. Replace any light bulbs as soon as they burn out.  Set up your furniture so you have a clear path. Avoid moving your furniture around.  If any of your floors are uneven, fix them.  If there are any pets around you, be aware of where they are.  Review your medicines with your doctor. Some medicines can make you feel dizzy. This can increase your chance of falling. Ask your doctor what other things that you can do to help prevent falls. This information is not intended to replace advice given to you by your health care provider. Make sure you discuss any questions you have with your health care provider. Document Released: 11/01/2008 Document Revised: 06/13/2015 Document Reviewed: 02/09/2014 Elsevier Interactive Patient Education  2017 [employer].

## 2018-12-07 ENCOUNTER — Telehealth: Payer: Self-pay | Admitting: Family Medicine

## 2018-12-07 DIAGNOSIS — R002 Palpitations: Secondary | ICD-10-CM | POA: Diagnosis not present

## 2018-12-07 DIAGNOSIS — R253 Fasciculation: Secondary | ICD-10-CM | POA: Diagnosis not present

## 2018-12-07 DIAGNOSIS — M62838 Other muscle spasm: Secondary | ICD-10-CM | POA: Diagnosis not present

## 2018-12-07 DIAGNOSIS — E039 Hypothyroidism, unspecified: Secondary | ICD-10-CM | POA: Diagnosis not present

## 2018-12-07 NOTE — Telephone Encounter (Signed)
Triaged, advised to go to acute care setting.  Nobie Putnam, DO [facility] 12/07/2018, 5:35 PM

## 2018-12-07 NOTE — Telephone Encounter (Signed)
Patient called this morning complaining about palpitation and chest flutter asking if she could be in afib, Suggested to go to Urgent care or ER to be evaluated hard to triage through phone  Patient understood very well.

## 2019-01-09 ENCOUNTER — Encounter: Payer: Self-pay | Admitting: Family Medicine

## 2019-01-09 ENCOUNTER — Ambulatory Visit (INDEPENDENT_AMBULATORY_CARE_PROVIDER_SITE_OTHER): Payer: PPO | Admitting: Family Medicine

## 2019-01-09 ENCOUNTER — Other Ambulatory Visit: Payer: Self-pay

## 2019-01-09 DIAGNOSIS — M47812 Spondylosis without myelopathy or radiculopathy, cervical region: Secondary | ICD-10-CM | POA: Diagnosis not present

## 2019-01-09 DIAGNOSIS — M159 Polyosteoarthritis, unspecified: Secondary | ICD-10-CM

## 2019-01-09 DIAGNOSIS — M8949 Other hypertrophic osteoarthropathy, multiple sites: Secondary | ICD-10-CM

## 2019-01-09 MED ORDER — TRAMADOL HCL 50 MG PO TABS: 50.0000 mg | ORAL_TABLET | Freq: Two times a day (BID) | ORAL | 1 refills | Status: DC | PRN

## 2019-01-09 NOTE — Patient Instructions (Addendum)
Try Tramadol as needed for pain. We can discuss further in future if this is helpful.  Can return to Dr Francisca December chiropractor  If need other orthopedic or maybe blood work before we consider rheumatology  Please schedule a Follow-up Appointment to: Return if symptoms worsen or fail to improve.  If you have any other questions or concerns, please feel free to call the office or send a message through [place]. You may also schedule an earlier appointment if necessary.  Additionally, you may be receiving a survey about your experience at our office within a few days to 1 week by e-mail or mail. We value your feedback.  Nobie Putnam, DO [place]

## 2019-01-09 NOTE — Progress Notes (Signed)
Virtual Visit via Telephone The purpose of this virtual visit is to provide medical care while limiting exposure to the novel coronavirus (COVID19) for both patient and office staff.  Consent was obtained for phone visit:  Yes.   Answered questions that patient had about telehealth interaction:  Yes.   I discussed the limitations, risks, security and privacy concerns of performing an evaluation and management service by telephone. I also discussed with the patient that there may be a patient responsible charge related to this service. The patient expressed understanding and agreed to proceed.  Patient Location: Home Provider Location: [facility])  ---------------------------------------------------------------------- Chief Complaint  Patient presents with  . Arthritis    need referral, bilateral shoulder ,bilateral knee and lower back.     S: Reviewed CMA documentation. I have called patient and gathered additional HPI as follows:  OSTEOARTHRITIS MULTIPLE JOINTS Reports that symptoms started years ago with recurrent pain with osteoarthritis in neck and lower left back with degenerative disc disease in cervical spine and lumbar spine, also with hand pain and arthritis. - She was followed by [facility]. She received injections and also prednisone in past. She had X-rays done in 06/2018 also seen 09/2018 by Dorise Hiss NP [facility]. Also has had injection in Right thumb due to arthritis - Over past 6 weeks, she was aggravating Right shoulder compared to her Left. She has been doing repetitive heavier lifting. Has caused flare up. She had intolerance on Prednisone. - She has difficulty laying on shoulder or back. - She has frustration with treatment from Orthopedic - Previously she tried Tramadol but only took it short term, it was helpful, she was hesitant to take more than 1.  Denies any high risk travel to areas of current concern for COVID19. Denies  any known or suspected exposure to person with or possibly with COVID19.  Denies any fevers, chills, sweats, body ache, cough, shortness of breath, sinus pain or pressure, headache, abdominal pain, diarrhea  Past Medical History:  Diagnosis Date  . Dermatophytosis of nail   . GERD (gastroesophageal reflux disease)   . History of shingles   . Hypertension   . Keratoderma    Social History   Tobacco Use  . Smoking status: Never Smoker  . Smokeless tobacco: Never Used  Substance Use Topics  . Alcohol use: Not Currently  . Drug use: No    Current Outpatient Medications:  .  baclofen (LIORESAL) 10 MG tablet, , Disp: , Rfl:  .  Biotin 1000 MCG tablet, Take by mouth., Disp: , Rfl:  .  Blood Glucose Monitoring Suppl ([place]) w/Device KIT, Use glucometer to check blood sugar daily., Disp: 1 each, Rfl: 0 .  cholecalciferol (VITAMIN D) 1000 units tablet, Take 1,000 Units by mouth daily., Disp: , Rfl:  .  escitalopram (LEXAPRO) 5 MG tablet, TAKE 1 TABLET BY MOUTH ONCE DAILY WITH FOOD, Disp: 90 tablet, Rfl: 3 .  glimepiride (AMARYL) 4 MG tablet, TAKE 1/2 TO 1 TABLET BY MOUTH ONCE DAILYBEFORE BREAKFAST, Disp: 90 tablet, Rfl: 0 .  glucose blood (ONE TOUCH ULTRA TEST) test strip, CHECK BLOOD SUGAR UP TO 2 TIMES A DAY., Disp: 100 each, Rfl: 11 .  levothyroxine (SYNTHROID) 25 MCG tablet, TAKE 1 TABLET BY MOUTH ONCE DAILY ON AN EMPTY STOMACH. WAIT 30 MINUTES BEFORE TAKING OTHER MEDS., Disp: 90 tablet, Rfl: 2 .  lisinopril (PRINIVIL,ZESTRIL) 5 MG tablet, TAKE 1 TABLET BY MOUTH ONCE DAILY., Disp: 90 tablet, Rfl: 3 .  omeprazole (PRILOSEC) 20 MG capsule, TAKE 1 CAPSULE BY MOUTH ONCE DAILY, Disp: 90 capsule, Rfl: 3 .  ONETOUCH DELICA LANCETS 91T MISC, 1 each by Does not apply route 3 (three) times daily. Dx:E11.9, Disp: 100 each, Rfl: 12 .  Probiotic Product (PROBIOTIC-10 PO), Take by mouth., Disp: , Rfl:  .  simvastatin (ZOCOR) 40 MG tablet, TAKE 1 TABLET BY MOUTH AT BEDTIME., Disp: 90  tablet, Rfl: 3 .  traMADol (ULTRAM) 50 MG tablet, Take 1-2 tablets (50-100 mg total) by mouth every 12 (twelve) hours as needed for moderate pain or severe pain., Disp: 30 tablet, Rfl: 1  Depression screen [facility] 2/9 01/09/2019 11/01/2018 09/12/2018  Decreased Interest 0 0 0  Down, Depressed, Hopeless 0 0 0  PHQ - 2 Score 0 0 0  Altered sleeping 3 - 0  Tired, decreased energy 0 - 0  Change in appetite 0 - 0  Feeling bad or failure about yourself  0 - 0  Trouble concentrating 0 - 0  Moving slowly or fidgety/restless 0 - 0  Suicidal thoughts 0 - 0  PHQ-9 Score 3 - 0  Difficult doing work/chores Not difficult at all - Not difficult at all  Some recent data might be hidden    GAD 7 : Generalized Anxiety Score 06/06/2018 03/02/2018 12/01/2016 10/06/2016  Nervous, Anxious, on Edge (No Data) 0 0 0  Control/stop worrying - 0 1 0  Worry too much - different things - 0 0 0  Trouble relaxing - 0 0 0  Restless - 0 0 0  Easily annoyed or irritable - 0 0 0  Afraid - awful might happen - 0 0 0  Total GAD 7 Score - 0 1 0  Anxiety Difficulty - Not difficult at all Not difficult at all Not difficult at all    -------------------------------------------------------------------------- O: No physical exam performed due to remote telephone encounter.  Lab results reviewed.  No results found for this or any previous visit (from the past 2160 hour(s)).  X-ray shoulder complete left minimum 2 views6/01/2018 [place] Result Narrative  AP and Y views of the left shoulder ordered interpreted by me in the  office today. Impression: Patient has adequate joint space along the  glenohumeral joint with mild AC joint osteoarthritis. There is adequate  subacromial space. No evidence of acute bony abnormality.     X-ray cervical spine 2 to 3 views3/01/2018 [place] Result Narrative  Imaging: AP and lateral views of the cervical spine are ordered  interpreted by me  in the office today. Pression: Patient has severe to  space degeneration C5-C6. There is anterior bridging osteophytes of C5-C6  with anterior bridging osteophytes at C6-C7. There is moderate cervical  spondylosis. No significant spondylolisthesis.     -------------------------------------------------------------------------- A&P:  Problem List Items Addressed This Visit    Osteoarthritis of multiple joints - Primary   Relevant Medications   baclofen (LIORESAL) 10 MG tablet   traMADol (ULTRAM) 50 MG tablet   DJD (degenerative joint disease) of cervical spine   Relevant Medications   baclofen (LIORESAL) 10 MG tablet   traMADol (ULTRAM) 50 MG tablet     Clinically multiple join OA/DJD complaints affecting her daily function Gradually worse now >6 months Previous [place], limited results on injection and other therapy Previously with chiropractor Failed other meds  Return to Dr Francisca December chiropractor Trial back on Tramadol PRN, was effective but did not take much of it. Reviewed dosing Prior rx Baclofen Max  dose Tylenol PRN Monitor symptoms, follow up if not improving, consider other ortho referral if need  Meds ordered this encounter  Medications  . traMADol (ULTRAM) 50 MG tablet    Sig: Take 1-2 tablets (50-100 mg total) by mouth every 12 (twelve) hours as needed for moderate pain or severe pain.    Dispense:  30 tablet    Refill:  1    Follow-up: - Return as needed within 6 wk to 3 month  Patient verbalizes understanding with the above medical recommendations including the limitation of remote medical advice.  Specific follow-up and call-back criteria were given for patient to follow-up or seek medical care more urgently if needed.   - Time spent in direct consultation with patient on phone: 9 minutes   Nobie Putnam, [facility] 01/09/2019, 1:33 PM

## 2019-01-16 DIAGNOSIS — E113293 Type 2 diabetes mellitus with mild nonproliferative diabetic retinopathy without macular edema, bilateral: Secondary | ICD-10-CM | POA: Diagnosis not present

## 2019-01-16 LAB — HM DIABETES EYE EXAM

## 2019-01-18 ENCOUNTER — Encounter: Payer: Self-pay | Admitting: Family Medicine

## 2019-01-19 ENCOUNTER — Other Ambulatory Visit: Payer: Self-pay | Admitting: Family Medicine

## 2019-01-19 ENCOUNTER — Other Ambulatory Visit: Payer: Self-pay | Admitting: Nurse Practitioner

## 2019-01-19 DIAGNOSIS — E785 Hyperlipidemia, unspecified: Secondary | ICD-10-CM

## 2019-01-19 DIAGNOSIS — E1169 Type 2 diabetes mellitus with other specified complication: Secondary | ICD-10-CM

## 2019-01-19 DIAGNOSIS — E1121 Type 2 diabetes mellitus with diabetic nephropathy: Secondary | ICD-10-CM

## 2019-01-23 ENCOUNTER — Encounter: Payer: Self-pay | Admitting: Family Medicine

## 2019-01-23 DIAGNOSIS — E113299 Type 2 diabetes mellitus with mild nonproliferative diabetic retinopathy without macular edema, unspecified eye: Secondary | ICD-10-CM | POA: Insufficient documentation

## 2019-01-24 ENCOUNTER — Other Ambulatory Visit: Payer: Self-pay

## 2019-01-24 ENCOUNTER — Telehealth: Payer: Self-pay

## 2019-01-24 NOTE — Telephone Encounter (Signed)
Error

## 2019-02-06 ENCOUNTER — Other Ambulatory Visit: Payer: Self-pay | Admitting: Family Medicine

## 2019-02-06 DIAGNOSIS — K219 Gastro-esophageal reflux disease without esophagitis: Secondary | ICD-10-CM

## 2019-03-10 ENCOUNTER — Other Ambulatory Visit: Payer: Self-pay | Admitting: Family Medicine

## 2019-03-10 DIAGNOSIS — E038 Other specified hypothyroidism: Secondary | ICD-10-CM

## 2019-03-15 ENCOUNTER — Other Ambulatory Visit: Payer: Self-pay

## 2019-03-15 ENCOUNTER — Encounter: Payer: Self-pay | Admitting: Family Medicine

## 2019-03-15 ENCOUNTER — Ambulatory Visit (INDEPENDENT_AMBULATORY_CARE_PROVIDER_SITE_OTHER): Payer: PPO | Admitting: Family Medicine

## 2019-03-15 VITALS — BP 139/62 | HR 66 | Temp 97.6°F | Resp 16 | Ht 59.0 in | Wt 131.0 lb

## 2019-03-15 DIAGNOSIS — E113299 Type 2 diabetes mellitus with mild nonproliferative diabetic retinopathy without macular edema, unspecified eye: Secondary | ICD-10-CM | POA: Diagnosis not present

## 2019-03-15 DIAGNOSIS — E1121 Type 2 diabetes mellitus with diabetic nephropathy: Secondary | ICD-10-CM

## 2019-03-15 DIAGNOSIS — F418 Other specified anxiety disorders: Secondary | ICD-10-CM | POA: Diagnosis not present

## 2019-03-15 DIAGNOSIS — N183 Chronic kidney disease, stage 3 unspecified: Secondary | ICD-10-CM

## 2019-03-15 DIAGNOSIS — E1122 Type 2 diabetes mellitus with diabetic chronic kidney disease: Secondary | ICD-10-CM

## 2019-03-15 LAB — POCT GLYCOSYLATED HEMOGLOBIN (HGB A1C): Hemoglobin A1C: 6.7 % — AB (ref 4.0–5.6)

## 2019-03-15 NOTE — Progress Notes (Signed)
Subjective:    Patient ID: Lisa Mills, female    DOB: 10/15/1929, 86 y.o.   MRN: [id number]  Lisa Mills is a 86 y.o. female presenting on 03/15/2019 for Diabetes   HPI   CHRONIC DM, Type 2, with CKD-III Doing well no new concerns. CBGs: -Avg90-120 (rare symptoms of hypoglycemia with some shakiness and not feeling "right"),checks CBGs1-2x daily Meds:Glimepiride 2 mg BID (half per dose of the-4mg ) - IN past was taken off Metformin due to CKD. Has failed Januvia Reports good compliance. Tolerating well w/o side-effects Currentlyon ACEi - Diet (improved DM diet) - Exercise (walking) DM Eye exam, at [facility] in September 2020 Admits occasional hypoglycemia Denies hypoglycemia  CHRONIC HTN: Reportshome BP is controlled Current Meds -Lisinopril 5mg  daily   FOLLOW-UPMixed anxiety/depression /Insomnia No new concerns, still some mixed anxiety symptoms but not endorsing lately, doing well on escitalopram still - She is less active, now she is home mostly, she is busy providing care for Lisa Mills- still causing her stress and anxiety as primary stressor - Prior failed Fluoxetine Denies depression  Health Maintenance: Due for COVID19 vaccine she will pursue this.  Depression screen [facility] 2/9 03/15/2019 01/09/2019 11/01/2018  Decreased Interest 0 0 0  Down, Depressed, Hopeless 0 0 0  PHQ - 2 Score 0 0 0  Altered sleeping 0 3 -  Tired, decreased energy 0 0 -  Change in appetite 0 0 -  Feeling bad or failure about yourself  0 0 -  Trouble concentrating 0 0 -  Moving slowly or fidgety/restless 0 0 -  Suicidal thoughts 0 0 -  PHQ-9 Score 0 3 -  Difficult doing work/chores Not difficult at all Not difficult at all -  Some recent data might be hidden    Social History   Tobacco Use  . Smoking status: Never Smoker  . Smokeless tobacco: Never Used  Substance Use Topics  . Alcohol use: Not Currently  . Drug use: No    Review of Systems Per HPI unless  specifically indicated above     Objective:    BP 139/62   Pulse 66   Temp 97.6 F (36.4 C) (Oral)   Resp 16   Ht 4\' 11"  (1.499 m)   Wt 131 lb (59.4 kg)   BMI 26.46 kg/m   Wt Readings from Last 3 Encounters:  03/15/19 131 lb (59.4 kg)  11/01/18 132 lb 6.4 oz (60.1 kg)  09/12/18 130 lb 9.6 oz (59.2 kg)    Physical Exam Vitals and nursing note reviewed.  Constitutional:      General: She is not in acute distress.    Appearance: She is well-developed. She is not diaphoretic.     Comments: Well-appearing, comfortable, cooperative  HENT:     Head: Normocephalic and atraumatic.  Eyes:     General:        Right eye: No discharge.        Left eye: No discharge.     Conjunctiva/sclera: Conjunctivae normal.  Neck:     Thyroid: No thyromegaly.  Cardiovascular:     Rate and Rhythm: Normal rate and regular rhythm.     Heart sounds: Normal heart sounds. No murmur.  Pulmonary:     Effort: Pulmonary effort is normal. No respiratory distress.     Breath sounds: Normal breath sounds. No wheezing or rales.  Musculoskeletal:        General: Normal range of motion.     Cervical back: Normal range of  motion and neck supple.  Lymphadenopathy:     Cervical: No cervical adenopathy.  Skin:    General: Skin is warm and dry.     Findings: No erythema or rash.  Neurological:     Mental Status: She is alert and oriented to person, place, and time.  Psychiatric:        Behavior: Behavior normal.     Comments: Well groomed, good eye contact, normal speech and thoughts      Recent Labs    06/03/18 0824 09/05/18 0830 03/15/19 1335  HGBA1C 7.4* 7.2* 6.7*    Results for orders placed or performed in visit on 03/15/19  POCT HgB A1C  Result Value Ref Range   Hemoglobin A1C 6.7 (A) 4.0 - 5.6 %      Assessment & Plan:   Problem List Items Addressed This Visit    Controlled type 2 diabetes mellitus with diabetic nephropathy ([place]) - Primary    Improved G1W to 6.7 Complications - at  risk hypoglycemia, nephropathy with CKD III-IV, also in setting HTN - Contraindicated - SGLT2. H/o failed Glyburide, Januvia. Not interested in injectable - Off metformin in past due to CKD - off januvia ineffective  Plan:  1. REDUCE Glimepiride from 2mg  BID (half of 4mg ) down to 2mg  ONCE daily with meal - likely dinner w/ largest meal. Caution hypoglycemia 2. Cannot take ASA due to CKD, continue statin, on ACEi 3. Encouraged lifestyle improvements with DM diet, continue walking      Relevant Medications   glimepiride (AMARYL) 4 MG tablet   Other Relevant Orders   POCT HgB A1C (Completed)   CKD stage 3 due to type 2 diabetes mellitus (HCC)    CKD-III, secondary to HTN DM age Followed by Nephrology [facility] Dr Candiss Norse  Plan: 1. Continue ACEi Lisinopril low dose 5mg  Improve hydration Follow-up      Relevant Medications   glimepiride (AMARYL) 4 MG tablet   Background diabetic retinopathy (HCC)   Relevant Medications   glimepiride (AMARYL) 4 MG tablet   Anxiety associated with depression    Stable Chronic caregiver stress Improved on SSRI lexapro         No orders of the defined types were placed in this encounter.     Follow up plan: Return in about 6 months (around 09/12/2019) for Annual Physical.  Future labs ordered for 09/06/19   Nobie Putnam, [facility] 03/15/2019, 1:35 PM

## 2019-03-15 NOTE — Patient Instructions (Addendum)
Thank you for coming to the office today.  Recent Labs    06/03/18 0824 09/05/18 0830 03/15/19 1335  HGBA1C 7.4* 7.2* 6.7*    Reduce Glimepride 4mg  (taking HALF pill) now only in evening with dinner OR can do only in AM with breakfast, once a day should be plenty.  ----------------------------   [place]:  [facility]) [place] [address]  Hours: Monday - Sunday 8:00am to 12:00pm  COVID-19 Vaccines By Appointment Only  Sign up for [place] List  [email or web address] or text "VACCINE" to [phone] or call [phone]   Please schedule a Follow-up Appointment to: Return in about 6 months (around 09/12/2019) for Annual Physical.  If you have any other questions or concerns, please feel free to call the office or send a message through [place]. You may also schedule an earlier appointment if necessary.  Additionally, you may be receiving a survey about your experience at our office within a few days to 1 week by e-mail or mail. We value your feedback.  Nobie Putnam, DO [place]

## 2019-03-16 ENCOUNTER — Encounter: Payer: Self-pay | Admitting: Family Medicine

## 2019-03-16 ENCOUNTER — Other Ambulatory Visit: Payer: Self-pay | Admitting: Family Medicine

## 2019-03-16 DIAGNOSIS — E063 Autoimmune thyroiditis: Secondary | ICD-10-CM

## 2019-03-16 DIAGNOSIS — E1169 Type 2 diabetes mellitus with other specified complication: Secondary | ICD-10-CM

## 2019-03-16 DIAGNOSIS — E1122 Type 2 diabetes mellitus with diabetic chronic kidney disease: Secondary | ICD-10-CM

## 2019-03-16 DIAGNOSIS — N183 Chronic kidney disease, stage 3 unspecified: Secondary | ICD-10-CM

## 2019-03-16 DIAGNOSIS — E1121 Type 2 diabetes mellitus with diabetic nephropathy: Secondary | ICD-10-CM

## 2019-03-16 DIAGNOSIS — E785 Hyperlipidemia, unspecified: Secondary | ICD-10-CM

## 2019-03-16 DIAGNOSIS — Z0000 Encounter for general adult medical examination without abnormal findings: Secondary | ICD-10-CM

## 2019-03-16 DIAGNOSIS — E038 Other specified hypothyroidism: Secondary | ICD-10-CM

## 2019-03-16 DIAGNOSIS — I129 Hypertensive chronic kidney disease with stage 1 through stage 4 chronic kidney disease, or unspecified chronic kidney disease: Secondary | ICD-10-CM

## 2019-03-16 NOTE — Assessment & Plan Note (Signed)
Stable Chronic caregiver stress Improved on SSRI lexapro

## 2019-03-16 NOTE — Assessment & Plan Note (Signed)
Improved Z0U to 6.7 Complications - at risk hypoglycemia, nephropathy with CKD III-IV, also in setting HTN - Contraindicated - SGLT2. H/o failed Glyburide, Januvia. Not interested in injectable - Off metformin in past due to CKD - off januvia ineffective  Plan:  1. REDUCE Glimepiride from 2mg  BID (half of 4mg ) down to 2mg  ONCE daily with meal - likely dinner w/ largest meal. Caution hypoglycemia 2. Cannot take ASA due to CKD, continue statin, on ACEi 3. Encouraged lifestyle improvements with DM diet, continue walking

## 2019-03-16 NOTE — Assessment & Plan Note (Signed)
CKD-III, secondary to HTN DM age Followed by Nephrology [facility] Dr Candiss Norse  Plan: 1. Continue ACEi Lisinopril low dose 5mg  Improve hydration Follow-up

## 2019-03-26 ENCOUNTER — Ambulatory Visit: Payer: PPO | Attending: Internal Medicine

## 2019-03-26 DIAGNOSIS — Z23 Encounter for immunization: Secondary | ICD-10-CM

## 2019-03-26 NOTE — Progress Notes (Signed)
   Covid-19 Vaccination Clinic  Name:  MAECIE SEVCIK    MRN: [id number] DOB: 23-May-1929  03/26/2019  Ms. Mcdade was observed post Covid-19 immunization for 15 minutes without incident. She was provided with Vaccine Information Sheet and instruction to access the V-Safe system.   Ms. Dillon was instructed to call 911 with any severe reactions post vaccine: Marland Kitchen Difficulty breathing  . Swelling of face and throat  . A fast heartbeat  . A bad rash all over body  . Dizziness and weakness   Immunizations Administered    Name Date Dose VIS Date Route   Pfizer COVID-19 Vaccine 03/26/2019  2:09 PM 0.3 mL 12/30/2018 Intramuscular   Manufacturer: [place]   Lot: [id number]   [place]: [id number]

## 2019-04-19 ENCOUNTER — Ambulatory Visit: Payer: PPO | Attending: Internal Medicine

## 2019-04-19 DIAGNOSIS — Z23 Encounter for immunization: Secondary | ICD-10-CM

## 2019-04-19 NOTE — Progress Notes (Signed)
   Covid-19 Vaccination Clinic  Name:  Lisa Mills    MRN: [id number] DOB: [birth year]  04/19/2019  Lisa Mills was observed post Covid-19 immunization for 15 minutes without incident. She was provided with Vaccine Information Sheet and instruction to access the V-Safe system.   Lisa Mills was instructed to call 911 with any severe reactions post vaccine: Marland Kitchen Difficulty breathing  . Swelling of face and throat  . A fast heartbeat  . A bad rash all over body  . Dizziness and weakness   Immunizations Administered    Name Date Dose VIS Date Route   Pfizer COVID-19 Vaccine 04/19/2019  4:38 PM 0.3 mL 12/30/2018 Intramuscular   Manufacturer: [place]   Lot: [id number]   [place]: [address]-5

## 2019-04-25 ENCOUNTER — Other Ambulatory Visit: Payer: Self-pay | Admitting: Family Medicine

## 2019-04-25 DIAGNOSIS — E1122 Type 2 diabetes mellitus with diabetic chronic kidney disease: Secondary | ICD-10-CM

## 2019-04-25 DIAGNOSIS — I10 Essential (primary) hypertension: Secondary | ICD-10-CM

## 2019-06-16 ENCOUNTER — Encounter: Payer: Self-pay | Admitting: Family Medicine

## 2019-06-16 ENCOUNTER — Ambulatory Visit (INDEPENDENT_AMBULATORY_CARE_PROVIDER_SITE_OTHER): Payer: PPO | Admitting: Family Medicine

## 2019-06-16 ENCOUNTER — Other Ambulatory Visit: Payer: Self-pay

## 2019-06-16 VITALS — BP 145/71 | HR 87 | Temp 97.8°F | Resp 16 | Ht 59.0 in | Wt 127.0 lb

## 2019-06-16 DIAGNOSIS — L739 Follicular disorder, unspecified: Secondary | ICD-10-CM | POA: Diagnosis not present

## 2019-06-16 DIAGNOSIS — R1032 Left lower quadrant pain: Secondary | ICD-10-CM | POA: Diagnosis not present

## 2019-06-16 DIAGNOSIS — M25552 Pain in left hip: Secondary | ICD-10-CM | POA: Diagnosis not present

## 2019-06-16 MED ORDER — MUPIROCIN 2 % EX OINT: 1.0000 "application " | TOPICAL_OINTMENT | Freq: Two times a day (BID) | CUTANEOUS | 0 refills | Status: DC

## 2019-06-16 NOTE — Patient Instructions (Addendum)
Thank you for coming to the office today.  For belly button use topical antibiotic ointment twice a day for 2 weeks.  For hip and leg / groin  Keep on tramadol, Tylenol, ADD Baclofen as needed.  Hold off on X-ray. If you are not improved by next week can call or message and we can order an X-ray here you can do a walk in to our clinic to do an X-ray.  You can try a donut cushion if you want.  If not improving we can refer back to [facility]  Please schedule a Follow-up Appointment to: Return in about 2 weeks (around 06/30/2019), or if symptoms worsen or fail to improve, for fall, injury hip.  If you have any other questions or concerns, please feel free to call the office or send a message through [place]. You may also schedule an earlier appointment if necessary.  Additionally, you may be receiving a survey about your experience at our office within a few days to 1 week by e-mail or mail. We value your feedback.  Nobie Putnam, DO [place]

## 2019-06-16 NOTE — Progress Notes (Signed)
Subjective:    Patient ID: Lisa Mills, female    DOB: May 13, 1929, 86 y.o.   MRN: [id number]  Lisa Mills is a 86 y.o. female presenting on 06/16/2019 for Fall (onset 10 days left side)   HPI   Left groin / hip / back pain - Fall injury Recent injury accidental onset 10 day ago walking dog, other dog attacked her dog she tried to separate and fell. Had some pain. No swelling or bruising. Taking Tramadol, Tylenol twice a day some relief She is improving overall now today Has left inner groin / low back and hip pain with some radiation History on last dexa 2018 Osteopenia Prior history of lumbar arthritis, last x-ray 2018 [facility] Todd gaines PA  Skin infection Reports belly button with skin infection inflammation redness, she used peroxide with some relief.   Depression screen [facility] 2/9 03/15/2019 01/09/2019 11/01/2018  Decreased Interest 0 0 0  Down, Depressed, Hopeless 0 0 0  PHQ - 2 Score 0 0 0  Altered sleeping 0 3 -  Tired, decreased energy 0 0 -  Change in appetite 0 0 -  Feeling bad or failure about yourself  0 0 -  Trouble concentrating 0 0 -  Moving slowly or fidgety/restless 0 0 -  Suicidal thoughts 0 0 -  PHQ-9 Score 0 3 -  Difficult doing work/chores Not difficult at all Not difficult at all -  Some recent data might be hidden    Social History   Tobacco Use  . Smoking status: Never Smoker  . Smokeless tobacco: Never Used  Substance Use Topics  . Alcohol use: Not Currently  . Drug use: No    Review of Systems Per HPI unless specifically indicated above     Objective:    BP (!) 145/71   Pulse 87   Temp 97.8 F (36.6 C) (Temporal)   Resp 16   Ht 4\' 11"  (1.499 m)   Wt 127 lb (57.6 kg)   SpO2 98%   BMI 25.65 kg/m   Wt Readings from Last 3 Encounters:  06/16/19 127 lb (57.6 kg)  03/15/19 131 lb (59.4 kg)  11/01/18 132 lb 6.4 oz (60.1 kg)    Physical Exam Vitals and nursing note reviewed.  Constitutional:      General: She is not in  acute distress.    Appearance: She is well-developed. She is not diaphoretic.     Comments: Well-appearing, comfortable, cooperative, thin elderly female 89y  HENT:     Head: Normocephalic and atraumatic.  Eyes:     General:        Right eye: No discharge.        Left eye: No discharge.     Conjunctiva/sclera: Conjunctivae normal.  Neck:     Thyroid: No thyromegaly.  Cardiovascular:     Rate and Rhythm: Normal rate and regular rhythm.     Heart sounds: Normal heart sounds. No murmur.  Pulmonary:     Effort: Pulmonary effort is normal. No respiratory distress.     Breath sounds: Normal breath sounds. No wheezing or rales.  Musculoskeletal:        General: Normal range of motion.     Cervical back: Normal range of motion and neck supple.     Comments: Low Back / L hip and Greater Trochanter Inspection: BACK - Normal appearance, no spinal deformity, symmetrical. HIP - Normal appearance, symmetrical, no obvious leg length or pelvis deformity  Palpation: BACK - No  tenderness over spinous processes. Bilateral lumbar paraspinal muscles non-tender and but with some hypertonicity/spasm. HIP - non tender L greater trochanter region of lateral upper thigh. Lower extremity thigh calf soft non tender no spasm.  ROM: BACK - active ROM forward flex / back extension, rotation L/R without discomfort HIP - Mild discomfort with left hip flexion against resistance, internal and external rotation normal without problem or limitation.  Special Testing: BACK - Seated SLR negative for radicular pain bilaterally  Strength: Bilateral hip flex/ext 5/5, knee flex/ext 5/5, ankle dorsiflex/plantarflex 5/5 Neurovascular: intact distal sensation to light touch  Antalgic gait on ambulation, uses cane without problem  Lymphadenopathy:     Cervical: No cervical adenopathy.  Skin:    General: Skin is warm and dry.     Findings: No erythema or rash.     Comments: Umbilical fold with prior area of incisional  scar, has slight erythema no drainage  Neurological:     Mental Status: She is alert and oriented to person, place, and time.  Psychiatric:        Behavior: Behavior normal.     Comments: Well groomed, good eye contact, normal speech and thoughts       Results for orders placed or performed in visit on 03/15/19  POCT HgB A1C  Result Value Ref Range   Hemoglobin A1C 6.7 (A) 4.0 - 5.6 %      Assessment & Plan:   Problem List Items Addressed This Visit    None    Visit Diagnoses    Acute hip pain, left    -  Primary   Folliculitis       Relevant Medications   mupirocin ointment (BACTROBAN) 2 %   Left groin pain          Acute on chronic with L Hip pain Following fall injury Concern osteopenia at risk of fracture, now with notable improvement over 10 days, without ecchymosis, has range of motion, mostly with hypertonicity and muscle pain by exam and history - No radiation of pain or radicular symptoms Responding to therapy  Plan: Offered X-ray, none in office today can go to [facility] if need locally, she declines, may return for x-ray 1-2 week if indicated or not improving.  For now continue Tramadol, Tylenol, and add existing baclofen PRN - already has enough meds Notify if need refill Encouraged use of heating pad 1-2x daily for now then PRN. Stretching exercises  Follow-up 4-6 weeks if not improving, consider hip x-rays, future troch bursa steroid injection, may follow-up with already established Ortho if needed [facility]   #Umbilical folliculitis No cellulitis or spreading infection Trial on topical mupirocin BID 1-2 weeks  Meds ordered this encounter  Medications  . mupirocin ointment (BACTROBAN) 2 %    Sig: Apply 1 application topically 2 (two) times daily. For 1-2 weeks    Dispense:  22 g    Refill:  0      Follow up plan: Return in about 2 weeks (around 06/30/2019), or if symptoms worsen or fail to improve, for fall, injury hip.   Nobie Putnam,  [facility] 06/16/2019, 10:04 AM

## 2019-06-20 ENCOUNTER — Telehealth: Payer: Self-pay

## 2019-06-20 DIAGNOSIS — R1032 Left lower quadrant pain: Secondary | ICD-10-CM

## 2019-06-20 DIAGNOSIS — M25552 Pain in left hip: Secondary | ICD-10-CM

## 2019-06-20 NOTE — Telephone Encounter (Signed)
Please notify patient:  I have placed order for Left hip/pelvis x-ray here at [facility]. She can do walk in, no apt required anytime rest of this week (not during lunch hour) and we can do x-ray and follow-up with her after results by phone.  Nobie Putnam, DO [facility] 06/20/2019, 6:35 PM

## 2019-06-20 NOTE — Telephone Encounter (Signed)
Copied from [place] [phone]. Topic: General - Other >> Jun 20, 2019 12:24 PM Celene Kras wrote: Reason for CRM: Pt called stating that she is still experiencing a lot of pain in her pelvic area. Pt states that she was instructed by PCP to call back if the pain didn't get any better. Pt is now requesting to have an xray done. Please advise.

## 2019-06-21 ENCOUNTER — Ambulatory Visit
Admission: RE | Admit: 2019-06-21 | Discharge: 2019-06-21 | Disposition: A | Discharge status: Home / Self Care | Payer: PPO | Source: Ambulatory Visit | Attending: Family Medicine | Admitting: Family Medicine

## 2019-06-21 ENCOUNTER — Ambulatory Visit: Payer: PPO

## 2019-06-21 ENCOUNTER — Other Ambulatory Visit: Payer: Self-pay

## 2019-06-21 DIAGNOSIS — R1032 Left lower quadrant pain: Secondary | ICD-10-CM | POA: Diagnosis not present

## 2019-06-21 DIAGNOSIS — M25552 Pain in left hip: Secondary | ICD-10-CM | POA: Insufficient documentation

## 2019-06-21 DIAGNOSIS — S32512A Fracture of superior rim of left pubis, initial encounter for closed fracture: Secondary | ICD-10-CM | POA: Diagnosis not present

## 2019-06-22 ENCOUNTER — Other Ambulatory Visit: Payer: Self-pay | Admitting: Family Medicine

## 2019-06-22 DIAGNOSIS — M47812 Spondylosis without myelopathy or radiculopathy, cervical region: Secondary | ICD-10-CM

## 2019-06-22 DIAGNOSIS — M8949 Other hypertrophic osteoarthropathy, multiple sites: Secondary | ICD-10-CM

## 2019-06-22 DIAGNOSIS — M159 Polyosteoarthritis, unspecified: Secondary | ICD-10-CM

## 2019-06-22 MED ORDER — TRAMADOL HCL 50 MG PO TABS: 50.0000 mg | ORAL_TABLET | Freq: Two times a day (BID) | ORAL | 1 refills | Status: DC | PRN

## 2019-06-23 NOTE — Progress Notes (Signed)
Patient notified

## 2019-06-27 DIAGNOSIS — M5416 Radiculopathy, lumbar region: Secondary | ICD-10-CM | POA: Diagnosis not present

## 2019-06-27 DIAGNOSIS — S32592A Other specified fracture of left pubis, initial encounter for closed fracture: Secondary | ICD-10-CM | POA: Diagnosis not present

## 2019-06-27 DIAGNOSIS — R102 Pelvic and perineal pain: Secondary | ICD-10-CM | POA: Diagnosis not present

## 2019-06-27 DIAGNOSIS — W010XXA Fall on same level from slipping, tripping and stumbling without subsequent striking against object, initial encounter: Secondary | ICD-10-CM | POA: Diagnosis not present

## 2019-06-30 ENCOUNTER — Ambulatory Visit: Payer: PPO | Admitting: Family Medicine

## 2019-07-01 ENCOUNTER — Other Ambulatory Visit: Payer: Self-pay | Admitting: Family Medicine

## 2019-07-01 DIAGNOSIS — M159 Polyosteoarthritis, unspecified: Secondary | ICD-10-CM

## 2019-07-01 DIAGNOSIS — M8949 Other hypertrophic osteoarthropathy, multiple sites: Secondary | ICD-10-CM

## 2019-07-01 DIAGNOSIS — M47812 Spondylosis without myelopathy or radiculopathy, cervical region: Secondary | ICD-10-CM

## 2019-07-01 NOTE — Telephone Encounter (Signed)
Requested medication (s) are due for refill today: no  Requested medication (s) are on the active medication list: yes  Last refill: 06/22/19  Future visit scheduled: yes  Notes to clinic:  med not delegated to NT to RF or refuse   Requested Prescriptions  Pending Prescriptions Disp Refills   traMADol (ULTRAM) 50 MG tablet [Pharmacy Med Name: TRAMADOL HCL 50 MG TAB] 30 tablet     Sig: TAKE 1 OR 2 TABLETS BY MOUTH EVERY 12 HOURS AS NEEDED FOR PAIN      Not Delegated - Analgesics:  Opioid Agonists Failed - 07/01/2019  8:36 AM      Failed - This refill cannot be delegated      Failed - Urine Drug Screen completed in last 360 days.      Passed - Valid encounter within last 6 months    Recent Outpatient Visits           2 weeks ago Acute hip pain, left   [place], DO   3 months ago Controlled type 2 diabetes mellitus with diabetic nephropathy, without long-term current use of insulin [facility])   [place], DO   5 months ago Primary osteoarthritis involving multiple joints   [facility] Olin Hauser, DO   9 months ago Annual physical exam   [place], DO   1 year ago Strain of left shoulder, initial encounter   [address], Devonne Doughty, DO

## 2019-07-03 ENCOUNTER — Encounter: Payer: Self-pay | Admitting: Family Medicine

## 2019-07-03 ENCOUNTER — Other Ambulatory Visit: Payer: Self-pay

## 2019-07-03 ENCOUNTER — Ambulatory Visit (INDEPENDENT_AMBULATORY_CARE_PROVIDER_SITE_OTHER): Payer: PPO | Admitting: Family Medicine

## 2019-07-03 DIAGNOSIS — S32592D Other specified fracture of left pubis, subsequent encounter for fracture with routine healing: Secondary | ICD-10-CM | POA: Diagnosis not present

## 2019-07-03 DIAGNOSIS — M5416 Radiculopathy, lumbar region: Secondary | ICD-10-CM

## 2019-07-03 MED ORDER — OXYCODONE HCL 5 MG PO TABS: 5.0000 mg | ORAL_TABLET | ORAL | 0 refills | Status: DC | PRN

## 2019-07-03 NOTE — Progress Notes (Signed)
Virtual Visit via Telephone The purpose of this virtual visit is to provide medical care while limiting exposure to the novel coronavirus (COVID19) for both patient and office staff.  Consent was obtained for phone visit:  Yes.   Answered questions that patient had about telehealth interaction:  Yes.   I discussed the limitations, risks, security and privacy concerns of performing an evaluation and management service by telephone. I also discussed with the patient that there may be a patient responsible charge related to this service. The patient expressed understanding and agreed to proceed.  Patient Location: Home Provider Location: [facility])  ---------------------------------------------------------------------- Chief Complaint  Patient presents with  . Fall    06/09/2019--now it is Right side after 3 days of her last visit with Dr.K --Tramadol twice every 4 hours not helping    S: Reviewed CMA documentation. I have called patient and gathered additional HPI as follows:  Follow-up L pubis rami fractures multiple, closed / fall injury Last seen by me 06/16/19 for initial, had X-ray done L hip pelvis, with fractures identified, given rx Tramadol and referred to [facility] She saw [facility] on 06/27/19 - groin pain improved at that time but had some other pains and some back / R sided pain too 06/27/19 [facility] repeat pelvis x-ray with healing shown Recently given rx Tramadol for 15 day supply - taking 8 pills a day, 2 of the '50mg'$  every 6 hours - it takes edge off for a few hours then pain returns. Today she says no pain worse, wake up overnight at times Using walker Has apt with ortho in 2 weeks but calling for more pain control  Denies any fevers, chills, sweats, body ache, cough, shortness of breath, sinus pain or pressure, headache, abdominal pain, diarrhea  Past Medical History:  Diagnosis Date  . Dermatophytosis of nail   . GERD (gastroesophageal reflux  disease)   . History of shingles   . Hypertension   . Keratoderma    Social History   Tobacco Use  . Smoking status: Never Smoker  . Smokeless tobacco: Never Used  Vaping Use  . Vaping Use: Never used  Substance Use Topics  . Alcohol use: Not Currently  . Drug use: No    Current Outpatient Medications:  .  baclofen (LIORESAL) 10 MG tablet, , Disp: , Rfl:  .  Biotin 1000 MCG tablet, Take by mouth., Disp: , Rfl:  .  Blood Glucose Monitoring Suppl ([place]) w/Device KIT, Use glucometer to check blood sugar daily., Disp: 1 each, Rfl: 0 .  cholecalciferol (VITAMIN D) 1000 units tablet, Take 1,000 Units by mouth daily., Disp: , Rfl:  .  escitalopram (LEXAPRO) 5 MG tablet, TAKE 1 TABLET BY MOUTH ONCE DAILY WITH FOOD, Disp: 90 tablet, Rfl: 3 .  glimepiride (AMARYL) 4 MG tablet, Take 0.5 tablets (2 mg total) by mouth daily. With meal, either breakfast or dinner., Disp: 45 tablet, Rfl: 1 .  glucose blood (ONE TOUCH ULTRA TEST) test strip, CHECK BLOOD SUGAR UP TO 2 TIMES A DAY., Disp: 100 each, Rfl: 11 .  levothyroxine (SYNTHROID) 25 MCG tablet, TAKE 1 TABLET BY MOUTH ONCE DAILY ON AN EMPTY STOMACH. WAIT 30 MINUTES BEFORE TAKING OTHER MEDS., Disp: 90 tablet, Rfl: 2 .  lisinopril (ZESTRIL) 5 MG tablet, TAKE 1 TABLET BY MOUTH ONCE DAILY, Disp: 90 tablet, Rfl: 3 .  mupirocin ointment (BACTROBAN) 2 %, Apply 1 application topically 2 (two) times daily. For 1-2 weeks,  Disp: 22 g, Rfl: 0 .  omeprazole (PRILOSEC) 20 MG capsule, TAKE 1 CAPSULE BY MOUTH ONCE DAILY, Disp: 90 capsule, Rfl: 3 .  ONETOUCH DELICA LANCETS 96Q MISC, 1 each by Does not apply route 3 (three) times daily. Dx:E11.9, Disp: 100 each, Rfl: 12 .  Probiotic Product (PROBIOTIC-10 PO), Take by mouth., Disp: , Rfl:  .  simvastatin (ZOCOR) 40 MG tablet, TAKE 1 TABLET BY MOUTH AT BEDTIME, Disp: 90 tablet, Rfl: 3 .  methylPREDNISolone (MEDROL DOSEPAK) 4 MG TBPK tablet, , Disp: , Rfl:  .  oxyCODONE (OXY IR/ROXICODONE) 5 MG  immediate release tablet, Take 1 tablet (5 mg total) by mouth every 4 (four) hours as needed for severe pain., Disp: 30 tablet, Rfl: 0  Depression screen [facility] 2/9 03/15/2019 01/09/2019 11/01/2018  Decreased Interest 0 0 0  Down, Depressed, Hopeless 0 0 0  PHQ - 2 Score 0 0 0  Altered sleeping 0 3 -  Tired, decreased energy 0 0 -  Change in appetite 0 0 -  Feeling bad or failure about yourself  0 0 -  Trouble concentrating 0 0 -  Moving slowly or fidgety/restless 0 0 -  Suicidal thoughts 0 0 -  PHQ-9 Score 0 3 -  Difficult doing work/chores Not difficult at all Not difficult at all -  Some recent data might be hidden    GAD 7 : Generalized Anxiety Score 06/06/2018 03/02/2018 12/01/2016 10/06/2016  Nervous, Anxious, on Edge (No Data) 0 0 0  Control/stop worrying - 0 1 0  Worry too much - different things - 0 0 0  Trouble relaxing - 0 0 0  Restless - 0 0 0  Easily annoyed or irritable - 0 0 0  Afraid - awful might happen - 0 0 0  Total GAD 7 Score - 0 1 0  Anxiety Difficulty - Not difficult at all Not difficult at all Not difficult at all    -------------------------------------------------------------------------- O: No physical exam performed due to remote telephone encounter.  Lab results reviewed.  No results found for this or any previous visit (from the past 2160 hour(s)).  -------------------------------------------------------------------------- A&P:  Problem List Items Addressed This Visit    None    Visit Diagnoses    Closed fracture of multiple rami of left pubis with routine healing, subsequent encounter    -  Primary   Relevant Medications   oxyCODONE (OXY IR/ROXICODONE) 5 MG immediate release tablet   Lumbar radiculopathy         Persistent pain in Left hip/groin area and also now some R sided pain from recent fall injury Multiple closed fractures L pubis on prior X-rays Already now established with [place] seen on 6/8, has had repeat x-rays with  some healing seen, thought to be unlikely stress injury to coccyx or sacrum no other x-rays done of those structures. She was given medrol dosepak and exercises and continue to use walker, and return in 3-4 weeks  PDMP reviewed  Now with worsening pain, she has contacted orthopedic, and she already had Tramadol, she is calling back for pain control until her upcoming ortho apt in 2 weeks.  They advised could take Hydrocodone PRN she has in past, caution sedation and in 86 year old patient. I advised her that Hydrocodone has red dye component she has allergy with hives listed.  DISCONTINUE Tramadol and I will change to Oxycodone IR '5mg'$  q 4 hr PRN #30 pills 0 refills.  Follow-up as advised sooner with Orthopedic  if needed. May warrant other x-rays she can return to ortho  Meds ordered this encounter  Medications  . oxyCODONE (OXY IR/ROXICODONE) 5 MG immediate release tablet    Sig: Take 1 tablet (5 mg total) by mouth every 4 (four) hours as needed for severe pain.    Dispense:  30 tablet    Refill:  0    Changed from Tramadol.    Follow-up: - Return as needed, has upcoming Ortho apt in 2 weeks  Patient verbalizes understanding with the above medical recommendations including the limitation of remote medical advice.  Specific follow-up and call-back criteria were given for patient to follow-up or seek medical care more urgently if needed.   - Time spent in direct consultation with patient on phone: 12 minutes   Nobie Putnam, [facility] 07/03/2019, 4:44 PM

## 2019-07-04 ENCOUNTER — Inpatient Hospital Stay
Admission: AD | Admit: 2019-07-04 | Discharge: 2019-07-07 | DRG: 516 | Disposition: A | Discharge status: Home / Self Care | Payer: PPO | Attending: Internal Medicine | Admitting: Internal Medicine

## 2019-07-04 ENCOUNTER — Emergency Department: Payer: PPO

## 2019-07-04 ENCOUNTER — Encounter: Payer: Self-pay | Admitting: Medical Oncology

## 2019-07-04 DIAGNOSIS — Z8619 Personal history of other infectious and parasitic diseases: Secondary | ICD-10-CM

## 2019-07-04 DIAGNOSIS — W19XXXA Unspecified fall, initial encounter: Secondary | ICD-10-CM | POA: Diagnosis present

## 2019-07-04 DIAGNOSIS — S32592S Other specified fracture of left pubis, sequela: Secondary | ICD-10-CM

## 2019-07-04 DIAGNOSIS — S32301A Unspecified fracture of right ilium, initial encounter for closed fracture: Secondary | ICD-10-CM | POA: Diagnosis present

## 2019-07-04 DIAGNOSIS — R32 Unspecified urinary incontinence: Secondary | ICD-10-CM | POA: Diagnosis not present

## 2019-07-04 DIAGNOSIS — R339 Retention of urine, unspecified: Secondary | ICD-10-CM | POA: Diagnosis not present

## 2019-07-04 DIAGNOSIS — I129 Hypertensive chronic kidney disease with stage 1 through stage 4 chronic kidney disease, or unspecified chronic kidney disease: Secondary | ICD-10-CM | POA: Diagnosis present

## 2019-07-04 DIAGNOSIS — S32592A Other specified fracture of left pubis, initial encounter for closed fracture: Secondary | ICD-10-CM | POA: Diagnosis not present

## 2019-07-04 DIAGNOSIS — Z882 Allergy status to sulfonamides status: Secondary | ICD-10-CM | POA: Diagnosis not present

## 2019-07-04 DIAGNOSIS — R8281 Pyuria: Secondary | ICD-10-CM | POA: Diagnosis not present

## 2019-07-04 DIAGNOSIS — S32591A Other specified fracture of right pubis, initial encounter for closed fracture: Secondary | ICD-10-CM | POA: Diagnosis not present

## 2019-07-04 DIAGNOSIS — Z79899 Other long term (current) drug therapy: Secondary | ICD-10-CM

## 2019-07-04 DIAGNOSIS — F418 Other specified anxiety disorders: Secondary | ICD-10-CM | POA: Diagnosis not present

## 2019-07-04 DIAGNOSIS — S32591S Other specified fracture of right pubis, sequela: Secondary | ICD-10-CM

## 2019-07-04 DIAGNOSIS — E039 Hypothyroidism, unspecified: Secondary | ICD-10-CM | POA: Diagnosis not present

## 2019-07-04 DIAGNOSIS — Z91041 Radiographic dye allergy status: Secondary | ICD-10-CM

## 2019-07-04 DIAGNOSIS — S3210XS Unspecified fracture of sacrum, sequela: Secondary | ICD-10-CM | POA: Diagnosis not present

## 2019-07-04 DIAGNOSIS — Z20822 Contact with and (suspected) exposure to covid-19: Secondary | ICD-10-CM | POA: Diagnosis not present

## 2019-07-04 DIAGNOSIS — K219 Gastro-esophageal reflux disease without esophagitis: Secondary | ICD-10-CM | POA: Diagnosis not present

## 2019-07-04 DIAGNOSIS — Z9842 Cataract extraction status, left eye: Secondary | ICD-10-CM

## 2019-07-04 DIAGNOSIS — R52 Pain, unspecified: Secondary | ICD-10-CM

## 2019-07-04 DIAGNOSIS — S32302A Unspecified fracture of left ilium, initial encounter for closed fracture: Secondary | ICD-10-CM | POA: Diagnosis present

## 2019-07-04 DIAGNOSIS — M533 Sacrococcygeal disorders, not elsewhere classified: Secondary | ICD-10-CM | POA: Diagnosis not present

## 2019-07-04 DIAGNOSIS — S32512A Fracture of superior rim of left pubis, initial encounter for closed fracture: Secondary | ICD-10-CM | POA: Diagnosis not present

## 2019-07-04 DIAGNOSIS — E1122 Type 2 diabetes mellitus with diabetic chronic kidney disease: Secondary | ICD-10-CM | POA: Diagnosis not present

## 2019-07-04 DIAGNOSIS — E871 Hypo-osmolality and hyponatremia: Secondary | ICD-10-CM | POA: Diagnosis not present

## 2019-07-04 DIAGNOSIS — N184 Chronic kidney disease, stage 4 (severe): Secondary | ICD-10-CM | POA: Diagnosis present

## 2019-07-04 DIAGNOSIS — S3210XA Unspecified fracture of sacrum, initial encounter for closed fracture: Secondary | ICD-10-CM | POA: Diagnosis not present

## 2019-07-04 DIAGNOSIS — D631 Anemia in chronic kidney disease: Secondary | ICD-10-CM | POA: Diagnosis not present

## 2019-07-04 DIAGNOSIS — Z79890 Hormone replacement therapy (postmenopausal): Secondary | ICD-10-CM | POA: Diagnosis not present

## 2019-07-04 DIAGNOSIS — Z886 Allergy status to analgesic agent status: Secondary | ICD-10-CM

## 2019-07-04 DIAGNOSIS — Z9841 Cataract extraction status, right eye: Secondary | ICD-10-CM

## 2019-07-04 DIAGNOSIS — W19XXXD Unspecified fall, subsequent encounter: Secondary | ICD-10-CM | POA: Diagnosis not present

## 2019-07-04 DIAGNOSIS — Z888 Allergy status to other drugs, medicaments and biological substances status: Secondary | ICD-10-CM | POA: Diagnosis not present

## 2019-07-04 DIAGNOSIS — S32592D Other specified fracture of left pubis, subsequent encounter for fracture with routine healing: Secondary | ICD-10-CM

## 2019-07-04 DIAGNOSIS — M4850XA Collapsed vertebra, not elsewhere classified, site unspecified, initial encounter for fracture: Secondary | ICD-10-CM

## 2019-07-04 LAB — CBC WITH DIFFERENTIAL/PLATELET
Abs Immature Granulocytes: 0.12 10*3/uL — ABNORMAL HIGH (ref 0.00–0.07)
Basophils Absolute: 0.1 10*3/uL (ref 0.0–0.1)
Basophils Relative: 0 %
Eosinophils Absolute: 0.1 10*3/uL (ref 0.0–0.5)
Eosinophils Relative: 1 %
HCT: 31.5 % — ABNORMAL LOW (ref 36.0–46.0)
Hemoglobin: 10.5 g/dL — ABNORMAL LOW (ref 12.0–15.0)
Immature Granulocytes: 1 %
Lymphocytes Relative: 14 %
Lymphs Abs: 1.9 10*3/uL (ref 0.7–4.0)
MCH: 30.9 pg (ref 26.0–34.0)
MCHC: 33.3 g/dL (ref 30.0–36.0)
MCV: 92.6 fL (ref 80.0–100.0)
Monocytes Absolute: 1.1 10*3/uL — ABNORMAL HIGH (ref 0.1–1.0)
Monocytes Relative: 8 %
Neutro Abs: 10.6 10*3/uL — ABNORMAL HIGH (ref 1.7–7.7)
Neutrophils Relative %: 76 %
Platelets: 419 10*3/uL — ABNORMAL HIGH (ref 150–400)
RBC: 3.4 MIL/uL — ABNORMAL LOW (ref 3.87–5.11)
RDW: 12.8 % (ref 11.5–15.5)
WBC: 13.8 10*3/uL — ABNORMAL HIGH (ref 4.0–10.5)
nRBC: 0 % (ref 0.0–0.2)

## 2019-07-04 LAB — URINALYSIS, COMPLETE (UACMP) WITH MICROSCOPIC
Bilirubin Urine: NEGATIVE
Glucose, UA: NEGATIVE mg/dL
Hgb urine dipstick: NEGATIVE
Ketones, ur: NEGATIVE mg/dL
Nitrite: NEGATIVE
Protein, ur: NEGATIVE mg/dL
Specific Gravity, Urine: 1.011 (ref 1.005–1.030)
pH: 5 (ref 5.0–8.0)

## 2019-07-04 LAB — BASIC METABOLIC PANEL
Anion gap: 11 (ref 5–15)
BUN: 39 mg/dL — ABNORMAL HIGH (ref 8–23)
CO2: 24 mmol/L (ref 22–32)
Calcium: 9.2 mg/dL (ref 8.9–10.3)
Chloride: 94 mmol/L — ABNORMAL LOW (ref 98–111)
Creatinine, Ser: 1.63 mg/dL — ABNORMAL HIGH (ref 0.44–1.00)
GFR calc Af Amer: 32 mL/min — ABNORMAL LOW (ref >60)
GFR calc non Af Amer: 28 mL/min — ABNORMAL LOW (ref >60)
Glucose, Bld: 134 mg/dL — ABNORMAL HIGH (ref 70–99)
Potassium: 4.4 mmol/L (ref 3.5–5.1)
Sodium: 129 mmol/L — ABNORMAL LOW (ref 135–145)

## 2019-07-04 LAB — SARS CORONAVIRUS 2 BY RT PCR (HOSPITAL ORDER, PERFORMED IN ~~LOC~~ HOSPITAL LAB): SARS Coronavirus 2: NEGATIVE

## 2019-07-04 MED ORDER — OXYCODONE HCL 5 MG PO TABS
5.0000 mg | ORAL_TABLET | ORAL | Status: DC | PRN
  Administered 2019-07-05 – 2019-07-07 (×5): 5 mg via ORAL
  Filled 2019-07-04 (×5): qty 1

## 2019-07-04 MED ORDER — SODIUM CHLORIDE 0.9 % IV SOLN: INTRAVENOUS | Status: DC

## 2019-07-04 MED ORDER — ONDANSETRON HCL 4 MG/2ML IJ SOLN: 4.0000 mg | Freq: Four times a day (QID) | INTRAMUSCULAR | Status: DC | PRN

## 2019-07-04 MED ORDER — ONDANSETRON 4 MG PO TBDP
4.0000 mg | ORAL_TABLET | Freq: Once | ORAL | Status: AC
  Administered 2019-07-04: 4 mg via ORAL
  Filled 2019-07-04: qty 1

## 2019-07-04 MED ORDER — ACETAMINOPHEN 650 MG RE SUPP: 650.0000 mg | Freq: Four times a day (QID) | RECTAL | Status: DC | PRN

## 2019-07-04 MED ORDER — MORPHINE SULFATE (PF) 4 MG/ML IV SOLN
4.0000 mg | Freq: Once | INTRAVENOUS | Status: AC
  Administered 2019-07-04: 4 mg via INTRAMUSCULAR
  Filled 2019-07-04: qty 1

## 2019-07-04 MED ORDER — ONDANSETRON HCL 4 MG PO TABS: 4.0000 mg | ORAL_TABLET | Freq: Four times a day (QID) | ORAL | Status: DC | PRN

## 2019-07-04 MED ORDER — SENNA 8.6 MG PO TABS
1.0000 | ORAL_TABLET | Freq: Two times a day (BID) | ORAL | Status: DC
  Administered 2019-07-04 – 2019-07-07 (×6): 8.6 mg via ORAL
  Filled 2019-07-04 (×6): qty 1

## 2019-07-04 MED ORDER — FENTANYL CITRATE (PF) 100 MCG/2ML IJ SOLN
50.0000 ug | Freq: Once | INTRAMUSCULAR | Status: AC
  Administered 2019-07-04: 50 ug via INTRAMUSCULAR
  Filled 2019-07-04: qty 2

## 2019-07-04 MED ORDER — ESCITALOPRAM OXALATE 10 MG PO TABS
5.0000 mg | ORAL_TABLET | Freq: Every day | ORAL | Status: DC
  Administered 2019-07-05 – 2019-07-07 (×3): 5 mg via ORAL
  Filled 2019-07-04 (×2): qty 0.5
  Filled 2019-07-04: qty 1
  Filled 2019-07-04: qty 0.5

## 2019-07-04 MED ORDER — SIMVASTATIN 20 MG PO TABS
40.0000 mg | ORAL_TABLET | Freq: Every day | ORAL | Status: DC
  Administered 2019-07-04 – 2019-07-07 (×3): 40 mg via ORAL
  Filled 2019-07-04 (×3): qty 2

## 2019-07-04 MED ORDER — VITAMIN D 25 MCG (1000 UNIT) PO TABS
1000.0000 [IU] | ORAL_TABLET | Freq: Every day | ORAL | Status: DC
  Administered 2019-07-04 – 2019-07-07 (×4): 1000 [IU] via ORAL
  Filled 2019-07-04 (×4): qty 1

## 2019-07-04 MED ORDER — CYCLOBENZAPRINE HCL 10 MG PO TABS
5.0000 mg | ORAL_TABLET | Freq: Three times a day (TID) | ORAL | Status: DC
  Administered 2019-07-04 – 2019-07-07 (×9): 5 mg via ORAL
  Filled 2019-07-04 (×10): qty 1

## 2019-07-04 MED ORDER — PANTOPRAZOLE SODIUM 40 MG PO TBEC
40.0000 mg | DELAYED_RELEASE_TABLET | Freq: Every day | ORAL | Status: DC
  Administered 2019-07-04 – 2019-07-07 (×4): 40 mg via ORAL
  Filled 2019-07-04 (×4): qty 1

## 2019-07-04 MED ORDER — LEVOTHYROXINE SODIUM 50 MCG PO TABS
25.0000 ug | ORAL_TABLET | Freq: Every day | ORAL | Status: DC
  Administered 2019-07-05 – 2019-07-07 (×3): 25 ug via ORAL
  Filled 2019-07-04 (×5): qty 1

## 2019-07-04 MED ORDER — ACETAMINOPHEN 325 MG PO TABS
650.0000 mg | ORAL_TABLET | Freq: Four times a day (QID) | ORAL | Status: DC | PRN
  Administered 2019-07-06: 650 mg via ORAL
  Filled 2019-07-04: qty 2

## 2019-07-04 MED ORDER — LISINOPRIL 5 MG PO TABS
5.0000 mg | ORAL_TABLET | Freq: Every day | ORAL | Status: DC
  Administered 2019-07-05 – 2019-07-07 (×3): 5 mg via ORAL
  Filled 2019-07-04 (×4): qty 1

## 2019-07-04 NOTE — ED Notes (Signed)
Pt awaiting admission

## 2019-07-04 NOTE — ED Provider Notes (Signed)
[facility] Emergency Department Provider Note  ____________________________________________   First MD Initiated Contact with Patient 07/04/19 1001     (approximate)  I have reviewed the triage vital signs and the nursing notes.   HISTORY  Chief Complaint Tailbone Pain    HPI Lisa Mills is a 86 y.o. female presents emergency department complaining of increasing tailbone and hip pain after a fall 1 month ago.  States she was told she has a fracture somewhere in her pelvis and had been taking tramadol but the pain has been increasing.  States they called in oxycodone for her which did not help at all.  She denies any falls time.  She usually uses her walker to ambulate.  Son is with her today.   Patient has history of osteopenia   Past Medical History:  Diagnosis Date  . Dermatophytosis of nail   . GERD (gastroesophageal reflux disease)   . History of shingles   . Hypertension   . Keratoderma     Patient Active Problem List   Diagnosis Date Noted  . Background diabetic retinopathy ([place]) 01/23/2019  . Schatzki's ring 03/04/2017  . Post herpetic neuralgia 10/06/2016  . Hx of colonic polyps 09/22/2016  . Osteopenia 09/02/2016  . Anxiety associated with depression 08/25/2016  . Trapezius muscle spasm 05/25/2016  . Intermittent left lower quadrant abdominal pain 03/16/2016  . Anemia in chronic kidney disease (CKD) 02/26/2016  . Controlled type 2 diabetes mellitus with diabetic nephropathy ([place]) 02/25/2016  . Hypothyroidism 02/25/2016  . Constipation 02/25/2016  . GERD (gastroesophageal reflux disease) 02/25/2016  . Hyperlipidemia associated with type 2 diabetes mellitus ([place]) 02/25/2016  . DJD (degenerative joint disease) of cervical spine 02/25/2016  . Osteoarthritis of multiple joints 02/25/2016  . Hiatal hernia 02/25/2016  . CKD stage 3 due to type 2 diabetes mellitus ([place]) 02/25/2016  . Chronic kidney disease, unspecified 09/05/2013  .  Benign hypertension with CKD (chronic kidney disease) stage III 09/05/2013  . CMC arthritis, thumb, degenerative 06/15/2013  . Onychomycosis due to dermatophyte 05/25/2013  . Acquired keratoderma 05/25/2013    Past Surgical History:  Procedure Laterality Date  . BREAST BIOPSY Right 09/30/2017   Affirm bx-calcs ( X clip),benign  . BREAST EXCISIONAL BIOPSY Right    neg  . cataracts    . COLONOSCOPY W/ POLYPECTOMY    . COLONOSCOPY WITH PROPOFOL N/A 02/10/2017   Procedure: COLONOSCOPY WITH PROPOFOL;  Surgeon: Manya Silvas, MD;  Location: [facility] ENDOSCOPY;  Service: Endoscopy;  Laterality: N/A;  . ESOPHAGOGASTRODUODENOSCOPY (EGD) WITH PROPOFOL N/A 02/10/2017   Procedure: ESOPHAGOGASTRODUODENOSCOPY (EGD) WITH PROPOFOL;  Surgeon: Manya Silvas, MD;  Location: [facility] ENDOSCOPY;  Service: Endoscopy;  Laterality: N/A;  . EYE SURGERY     bilateral cataracts  . HAND SURGERY    . KIDNEY STONE SURGERY    . TONSILECTOMY, ADENOIDECTOMY, BILATERAL MYRINGOTOMY AND TUBES    . TONSILLECTOMY    . TRIGGER FINGER RELEASE     Left ring finger  . tubercular peritonitis      Prior to Admission medications   Medication Sig Start Date End Date Taking? Authorizing Provider  baclofen (LIORESAL) 10 MG tablet  10/12/18   [provider]  Biotin 1000 MCG tablet Take by mouth.    [provider]  Blood Glucose Monitoring Suppl ([place]) w/Device KIT Use glucometer to check blood sugar daily. 02/25/16   Olin Hauser, DO  cholecalciferol (VITAMIN D) 1000 units tablet Take 1,000 Units by  mouth daily.    [provider]  escitalopram (LEXAPRO) 5 MG tablet TAKE 1 TABLET BY MOUTH ONCE DAILY WITH FOOD 08/17/18   [place], Devonne Doughty, DO  glimepiride (AMARYL) 4 MG tablet Take 0.5 tablets (2 mg total) by mouth daily. With meal, either breakfast or dinner. 03/15/19   Karamalegos, Devonne Doughty, DO  glucose blood (ONE TOUCH ULTRA TEST) test strip CHECK BLOOD SUGAR UP TO  2 TIMES A DAY. 03/07/18   Karamalegos, Devonne Doughty, DO  levothyroxine (SYNTHROID) 25 MCG tablet TAKE 1 TABLET BY MOUTH ONCE DAILY ON AN EMPTY STOMACH. WAIT 30 MINUTES BEFORE TAKING OTHER MEDS. 03/10/19   Olin Hauser, DO  lisinopril (ZESTRIL) 5 MG tablet TAKE 1 TABLET BY MOUTH ONCE DAILY 04/25/19   [place], Devonne Doughty, DO  methylPREDNISolone (MEDROL DOSEPAK) 4 MG TBPK tablet  06/27/19   [provider]  mupirocin ointment (BACTROBAN) 2 % Apply 1 application topically 2 (two) times daily. For 1-2 weeks 06/16/19   Olin Hauser, DO  omeprazole (PRILOSEC) 20 MG capsule TAKE 1 CAPSULE BY MOUTH ONCE DAILY 02/06/19   Karamalegos, Devonne Doughty, DO  ONETOUCH DELICA LANCETS 59Y MISC 1 each by Does not apply route 3 (three) times daily. Dx:E11.9 04/14/16   Karamalegos, Devonne Doughty, DO  oxyCODONE (OXY IR/ROXICODONE) 5 MG immediate release tablet Take 1 tablet (5 mg total) by mouth every 4 (four) hours as needed for severe pain. 07/03/19   Karamalegos, Devonne Doughty, DO  Probiotic Product (PROBIOTIC-10 PO) Take by mouth.    [provider]  simvastatin (ZOCOR) 40 MG tablet TAKE 1 TABLET BY MOUTH AT BEDTIME 01/19/19   Karamalegos, Devonne Doughty, DO    Allergies Aspirin, Conray [iothalamate], Dye fdc red [red dye], Sulfa antibiotics, and Prednisone  Family History  Adopted: Yes  Problem Relation Age of Onset  . Breast cancer Neg Hx     Social History Social History   Tobacco Use  . Smoking status: Never Smoker  . Smokeless tobacco: Never Used  Vaping Use  . Vaping Use: Never used  Substance Use Topics  . Alcohol use: Not Currently  . Drug use: No    Review of Systems  Constitutional: No fever/chills Eyes: No visual changes. ENT: No sore throat. Respiratory: Denies cough Cardiovascular: Denies chest pain Gastrointestinal: Denies abdominal pain Genitourinary: Negative for dysuria. Musculoskeletal: Negative for back pain.  Positive for coccyx and pelvis  pain Skin: Negative for rash. Psychiatric: no mood changes,     ____________________________________________   PHYSICAL EXAM:  VITAL SIGNS: ED Triage Vitals  Enc Vitals Group     BP 07/04/19 0935 139/65     Pulse Rate 07/04/19 0935 98     Resp 07/04/19 0935 18     Temp 07/04/19 0935 97.8 F (36.6 C)     Temp Source 07/04/19 0935 Oral     SpO2 07/04/19 0935 98 %     Weight 07/04/19 0936 125 lb 10.6 oz (57 kg)     Height 07/04/19 0936 _0  (1.499 m)     Head Circumference --      Peak Flow --      Pain Score 07/04/19 0935 10     Pain Loc --      Pain Edu? --      Excl. in [place]? --     Constitutional: Alert and oriented. Well appearing and in no acute distress. Eyes: Conjunctivae are normal.  Head: Atraumatic. Nose: No congestion/rhinnorhea. Mouth/Throat: Mucous membranes are moist.  Neck:  supple no lymphadenopathy noted Cardiovascular: Normal rate, regular rhythm. Heart sounds are normal Respiratory: Normal respiratory effort.  No retractions, lungs c t a  Abd: soft nontender bs normal all 4 quad GU: deferred Musculoskeletal: FROM all extremities, warm and well perfused, left hip and pelvis area are tender posteriorly, lower lumbar spine is tender Neurologic:  Normal speech and language.  Skin:  Skin is warm, dry and intact. No rash noted. Psychiatric: Mood and affect are normal. Speech and behavior are normal.  ____________________________________________   LABS (all labs ordered are listed, but only abnormal results are displayed)  Labs Reviewed  BASIC METABOLIC PANEL - Abnormal; Notable for the following components:      Result Value   Sodium 129 (*)    Chloride 94 (*)    Glucose, Bld 134 (*)    BUN 39 (*)    Creatinine, Ser 1.63 (*)    GFR calc non Af Amer 28 (*)    GFR calc Af Amer 32 (*)    All other components within normal limits  CBC WITH DIFFERENTIAL/PLATELET - Abnormal; Notable for the following components:   WBC 13.8 (*)    RBC 3.40 (*)     Hemoglobin 10.5 (*)    HCT 31.5 (*)    Platelets 419 (*)    Neutro Abs 10.6 (*)    Monocytes Absolute 1.1 (*)    Abs Immature Granulocytes 0.12 (*)    All other components within normal limits  SARS CORONAVIRUS 2 BY RT PCR (HOSPITAL ORDER, [place] [facility])   ____________________________________________   ____________________________________________  RADIOLOGY  CT of the lumbar spine and pelvis shows a sacral fracture at the S1 and at the sacral ala bilaterally  ____________________________________________   PROCEDURES  Procedure(s) performed: No  Procedures    ____________________________________________   INITIAL IMPRESSION / ASSESSMENT AND PLAN / ED COURSE  Pertinent labs & imaging results that were available during my care of the patient were reviewed by me and considered in my medical decision making (see chart for details).   The patient is an 86 year old female presents emergency department with worsening pelvis pain and coccyx pain after a fall 1 month ago.  Known fracture but patient is unsure where.  States pain has increased and got worse.  Physical exam shows patient appears stable.  Patient is tender along the sacrum and sacroiliac area, some tenderness along the left hip  DDx: Vertebral fracture, fractured hip, nonunion of previous fracture  Spoke with radiologist.  He states patient has Bilateral sacral ala fractures are present. Fracture is somewhat  more prominent on the right than on the left. Transverse S1 fracture  is present.   He suggests that we will get sacral plasty done.  Either with [facility] clinic or with his group.  I did call his office, appointments are scheduled out to June 29.  I contacted Rachelle Hora, PA-C Dr. Rudene Christians via secure message.  They will see her in the office tomorrow and schedule surgery for Thursday.  Patient is agreeable with this plan.  At this time I am trying to ensure do not need to admit her for  pain control.  ----------------------------------------- 2:07 PM on 07/04/2019 -----------------------------------------  Patient had 2 different narcotic medications which did not help with pain control.  She is concerned about going home due to the amount of pain.  Therefore I do feel it is appropriate to admit her for the sacral fracture along with pain control.  Spoke with the hospitalist.  Agreeable for admission.  Covid test and basic labs ordered.  Basic labs showed WBC of 13.8, decreased H&H of 10.5 and 59.0, basic metabolic panel shows sodium decreased to 129, elevated glucose, elevated BUN at 39 and elevated creatinine at 1.63.  Patient's GFR is also decreased   Lisa Mills was evaluated in Emergency Department on 07/04/2019 for the symptoms described in the history of present illness. She was evaluated in the context of the global COVID-19 pandemic, which necessitated consideration that the patient might be at risk for infection with the SARS-CoV-2 virus that causes COVID-19. Institutional protocols and algorithms that pertain to the evaluation of patients at risk for COVID-19 are in a state of rapid change based on information released by regulatory bodies including the CDC and federal and state organizations. These policies and algorithms were followed during the patient's care in the ED.   As part of my medical decision making, I reviewed the following data within the [place] notes reviewed and incorporated, Labs reviewed , Old chart reviewed, Radiograph reviewed , Discussed with admitting physician , Discussed with radiologist, A consult was requested and obtained from this/these consultant(s) Orthopedics, Notes from prior ED visits and [place] Controlled Substance Database  ____________________________________________   FINAL CLINICAL IMPRESSION(S) / ED DIAGNOSES  Final diagnoses:  Closed fracture of sacrum, unspecified portion of sacrum, initial  encounter ([place])  Inadequate pain control  Hyponatremia      NEW MEDICATIONS STARTED DURING THIS VISIT:  New Prescriptions   No medications on file     Note:  This document was prepared using Dragon voice recognition software and may include unintentional dictation errors.    Versie Starks, PA-C 07/04/19 1409    Nance Pear, MD 07/04/19 1436

## 2019-07-04 NOTE — ED Notes (Signed)
Placed pt on bed pan. Bed pan spilled in bed, cleaned pt up. Pt is now resting with fresh bedding.

## 2019-07-04 NOTE — ED Triage Notes (Signed)
Pt reports that she fell approx a month ago- seen her pcp and xray was done to left hip/pelvis d/t area of pain. Pt was given tramadol, pt reports at this moment she feels like the pain has "shifted" to her tail bone. Pt reports they increased the frequency of her tramadol but that didn't help either.

## 2019-07-04 NOTE — H&P (Addendum)
History and Physical    Lisa Mills XBJ:[id number] DOB: 06-Feb-1929 DOA: 07/04/2019  PCP: Olin Hauser, DO   Patient coming from: Home  I have personally briefly reviewed patient's old medical records in [place]  Chief Complaint: Pain in the Coccyx area  HPI: Lisa Mills is a 86 y.o. female with medical history significant for hypertension, GERD who presents to the emergency room for evaluation of pain in her tailbone area as well as hip pain following a mechanical fall about a month ago.  Pain is usually worse with ambulation.  Patient was told she had a fracture in her pelvis and has been on tramadol for pain control without any improvement.  She was then started on oxycodone which has not helped with her pain control.  She denies having any recent falls since then. She complains of urinary incontinence especially with movement with associated frequency of urination. She denies having any chest pain, nausea, vomiting, shortness of breath, fever, cough, dizziness or lightheadedness. Patient had a CT scan of the lumbar spine which showed bilateral sacral ala fractures and transverse S1 fracture appear acute and likely are the source of the patient's pain. Multilevel spondylosis of the lumbar spine as described. Mild central canal narrowing and moderate foraminal stenosis bilaterally at L2-3 and L4-5. Moderate foraminal stenosis bilaterally at L5-S1. CT scan of the pelvis showed subacute appearing nondisplaced bilateral sacral alar fractures, as described above. Subacute fractures of the left superior and inferior pubic rami. These fractures appear unchanged in alignment compared to radiographic study of 06/21/2019. Rounded fluid collection within the left obturator internus muscle measuring up to 2.4 cm adjacent to the left inferior pubic ramus fracture, likely reflecting an involving hematoma.   ED Course: Patient is an 86 year old female who presents to the emergency  room for evaluation of pain in the coccyx area following a fall.  She has bilateral iliac fractures as well as an acute fracture of S1.  She will be admitted to the hospital for pain control.  Review of Systems: As per HPI otherwise 10 point review of systems negative.    Past Medical History:  Diagnosis Date  . Dermatophytosis of nail   . GERD (gastroesophageal reflux disease)   . History of shingles   . Hypertension   . Keratoderma     Past Surgical History:  Procedure Laterality Date  . BREAST BIOPSY Right 09/30/2017   Affirm bx-calcs ( X clip),benign  . BREAST EXCISIONAL BIOPSY Right    neg  . cataracts    . COLONOSCOPY W/ POLYPECTOMY    . COLONOSCOPY WITH PROPOFOL N/A 02/10/2017   Procedure: COLONOSCOPY WITH PROPOFOL;  Surgeon: Manya Silvas, MD;  Location[facility] ENDOSCOPY;  Service: Endoscopy;  Laterality: N/A;  . ESOPHAGOGASTRODUODENOSCOPY (EGD) WITH PROPOFOL N/A 02/10/2017   Procedure: ESOPHAGOGASTRODUODENOSCOPY (EGD) WITH PROPOFOL;  Surgeon: Manya Silvas, MD;  Location: [facility] ENDOSCOPY;  Service: Endoscopy;  Laterality: N/A;  . EYE SURGERY     bilateral cataracts  . HAND SURGERY    . KIDNEY STONE SURGERY    . TONSILECTOMY, ADENOIDECTOMY, BILATERAL MYRINGOTOMY AND TUBES    . TONSILLECTOMY    . TRIGGER FINGER RELEASE     Left ring finger  . tubercular peritonitis       reports that she has never smoked. She has never used smokeless tobacco. She reports previous alcohol use. She reports that she does not use drugs.  Allergies  Allergen Reactions  . Aspirin Other (See Comments)  Burns stomach  . Conray [Iothalamate] Hives    IV dye conray-400  . Dye Fdc Red [Red Dye] Hives  . Sulfa Antibiotics   . Prednisone     Indigestion     Family History  Adopted: Yes  Problem Relation Age of Onset  . Breast cancer Neg Hx      Prior to Admission medications   Medication Sig Start Date End Date Taking? Authorizing Provider  baclofen (LIORESAL) 10 MG tablet   10/12/18   [provider]  Biotin 1000 MCG tablet Take by mouth.    [provider]  Blood Glucose Monitoring Suppl ([place]) w/Device KIT Use glucometer to check blood sugar daily. 02/25/16   Olin Hauser, DO  cholecalciferol (VITAMIN D) 1000 units tablet Take 1,000 Units by mouth daily.    [provider]  escitalopram (LEXAPRO) 5 MG tablet TAKE 1 TABLET BY MOUTH ONCE DAILY WITH FOOD 08/17/18   Parks Ranger, Devonne Doughty, DO  glimepiride (AMARYL) 4 MG tablet Take 0.5 tablets (2 mg total) by mouth daily. With meal, either breakfast or dinner. 03/15/19   Karamalegos, Devonne Doughty, DO  glucose blood (ONE TOUCH ULTRA TEST) test strip CHECK BLOOD SUGAR UP TO 2 TIMES A DAY. 03/07/18   Karamalegos, Devonne Doughty, DO  levothyroxine (SYNTHROID) 25 MCG tablet TAKE 1 TABLET BY MOUTH ONCE DAILY ON AN EMPTY STOMACH. WAIT 30 MINUTES BEFORE TAKING OTHER MEDS. 03/10/19   Olin Hauser, DO  lisinopril (ZESTRIL) 5 MG tablet TAKE 1 TABLET BY MOUTH ONCE DAILY 04/25/19   Parks Ranger, Devonne Doughty, DO  methylPREDNISolone (MEDROL DOSEPAK) 4 MG TBPK tablet  06/27/19   [provider]  mupirocin ointment (BACTROBAN) 2 % Apply 1 application topically 2 (two) times daily. For 1-2 weeks 06/16/19   Olin Hauser, DO  omeprazole (PRILOSEC) 20 MG capsule TAKE 1 CAPSULE BY MOUTH ONCE DAILY 02/06/19   Karamalegos, Devonne Doughty, DO  ONETOUCH DELICA LANCETS 83M MISC 1 each by Does not apply route 3 (three) times daily. Dx:E11.9 04/14/16   Karamalegos, Devonne Doughty, DO  oxyCODONE (OXY IR/ROXICODONE) 5 MG immediate release tablet Take 1 tablet (5 mg total) by mouth every 4 (four) hours as needed for severe pain. 07/03/19   Karamalegos, Devonne Doughty, DO  Probiotic Product (PROBIOTIC-10 PO) Take by mouth.    [provider]  simvastatin (ZOCOR) 40 MG tablet TAKE 1 TABLET BY MOUTH AT BEDTIME 01/19/19   Olin Hauser, DO    Physical Exam: Vitals:    07/04/19 0935 07/04/19 0936  BP: 139/65   Pulse: 98   Resp: 18   Temp: 97.8 F (36.6 C)   TempSrc: Oral   SpO2: 98%   Weight:  57 kg  Height:  '4\' 11"'$  (1.499 m)     Vitals:   07/04/19 0935 07/04/19 0936  BP: 139/65   Pulse: 98   Resp: 18   Temp: 97.8 F (36.6 C)   TempSrc: Oral   SpO2: 98%   Weight:  57 kg  Height:  '4\' 11"'$  (1.499 m)    Constitutional: NAD, alert and oriented x 3 Eyes: PERRL, lids and conjunctivae normal ENMT: Mucous membranes are moist.  Neck: normal, supple, no masses, no thyromegaly Respiratory: clear to auscultation bilaterally, no wheezing, no crackles. Normal respiratory effort. No accessory muscle use.  Cardiovascular: Regular rate and rhythm, no murmurs / rubs / gallops. No extremity edema. 2+ pedal pulses. No carotid bruits.  Abdomen: Suprapubic tenderness, no masses palpated. No  hepatosplenomegaly. Bowel sounds positive.  Musculoskeletal: no clubbing / cyanosis. No joint deformity upper and lower extremities.  Skin: no rashes, lesions, ulcers.  Neurologic: No gross focal neurologic deficit. Psychiatric: Normal mood and affect.   Labs on Admission: I have personally reviewed following labs and imaging studies  CBC: Recent Labs  Lab 07/04/19 1220  WBC 13.8*  NEUTROABS 10.6*  HGB 10.5*  HCT 31.5*  MCV 92.6  PLT 101*   Basic Metabolic Panel: Recent Labs  Lab 07/04/19 1220  NA 129*  K 4.4  CL 94*  CO2 24  GLUCOSE 134*  BUN 39*  CREATININE 1.63*  CALCIUM 9.2   GFR: Estimated Creatinine Clearance: 18.7 mL/min (A) (by C-G formula based on SCr of 1.63 mg/dL (H)). Liver Function Tests: No results for input(s): AST, ALT, ALKPHOS, BILITOT, PROT, ALBUMIN in the last 168 hours. No results for input(s): LIPASE, AMYLASE in the last 168 hours. No results for input(s): AMMONIA in the last 168 hours. Coagulation Profile: No results for input(s): INR, PROTIME in the last 168 hours. Cardiac Enzymes: No results for input(s): CKTOTAL, CKMB,  CKMBINDEX, TROPONINI in the last 168 hours. BNP (last 3 results) No results for input(s): PROBNP in the last 8760 hours. HbA1C: No results for input(s): HGBA1C in the last 72 hours. CBG: No results for input(s): GLUCAP in the last 168 hours. Lipid Profile: No results for input(s): CHOL, HDL, LDLCALC, TRIG, CHOLHDL, LDLDIRECT in the last 72 hours. Thyroid Function Tests: No results for input(s): TSH, T4TOTAL, FREET4, T3FREE, THYROIDAB in the last 72 hours. Anemia Panel: No results for input(s): VITAMINB12, FOLATE, FERRITIN, TIBC, IRON, RETICCTPCT in the last 72 hours. Urine analysis:    Component Value Date/Time   COLORURINE yellow 03/29/2017 0000   APPEARANCEUR clear 03/29/2017 0000   GLUCOSEU Negative (A) 03/29/2017 0000   BILIRUBINUR Negative (A) 03/29/2017 0000   UROBILINOGEN Normal 03/29/2017 0000   LEUKOCYTESUR Small (1+) (A) 03/29/2017 0000    Radiological Exams on Admission: CT Lumbar Spine Wo Contrast  Result Date: 07/04/2019 CLINICAL DATA:  Fall 1 month ago.  Pain in tailbone.  Low back pain. EXAM: CT LUMBAR SPINE WITHOUT CONTRAST TECHNIQUE: Multidetector CT imaging of the lumbar spine was performed without intravenous contrast administration. Multiplanar CT image reconstructions were also generated. COMPARISON:  None. FINDINGS: Segmentation: 5 non rib-bearing lumbar type vertebral bodies are present. The lowest fully formed vertebral body is L5. Alignment: Grade 1 anterolisthesis at L4-5 measures 5 mm. Slight retrolisthesis present at L2-3 and L3-4. Vertebrae: Vertebral body heights are maintained. No acute vertebral fracture is present. Bilateral sacral ala fractures are present. Fracture is somewhat more prominent on the right than on the left. Transverse S1 fracture is present. Paraspinal and other soft tissues: Atherosclerotic calcifications are present in the aorta without aneurysm. Benign appearing cyst is noted anteriorly in the right kidney. No other solid organ lesions  are present. Disc levels: L1-2: Leftward disc bulging is present. No significant stenosis is present. L2-3: A broad-based disc protrusion is present. Moderate facet hypertrophy is present. Mild central canal narrowing is present. Moderate foraminal stenosis is present bilaterally. L3-4: A broad-based disc protrusion is present. Advanced facet hypertrophy is noted bilaterally. No significant stenosis is present. L4-5: A broad-based disc protrusion is present. Advanced facet hypertrophy and spurring is noted bilaterally. Mild central canal narrowing is worse on the left. Moderate foraminal stenosis is worse on left. L5-S1: Moderate facet hypertrophy is noted bilaterally. Mild disc bulging is present. Moderate foraminal narrowing present bilaterally. The central  canal is patent. IMPRESSION: 1. Bilateral sacral ala fractures and transverse S1 fracture appear acute and likely are the source of the patient's pain. 2. Multilevel spondylosis of the lumbar spine as described. 3. Mild central canal narrowing and moderate foraminal stenosis bilaterally at L2-3 and L4-5. 4. Moderate foraminal stenosis bilaterally at L5-S1. 5. Aortic Atherosclerosis (ICD10-I70.0). Electronically Signed   By: San Morelle M.D.   On: 07/04/2019 11:32   CT PELVIS WO CONTRAST  Result Date: 07/04/2019 CLINICAL DATA:  Pelvic pain.  Fall approximately 1 month ago EXAM: CT PELVIS WITHOUT CONTRAST TECHNIQUE: Multidetector CT imaging of the pelvis was performed following the standard protocol without intravenous contrast. COMPARISON:  X-ray 06/21/2019 FINDINGS: Bones/Joint/Cartilage Subacute displaced fracture of the parasymphyseal aspect of the left pubic bone with involvement of the superior pubic ramus with moderate angulation and early callus formation (series 5, image 40). Moderately displaced fracture of the mid left inferior pubic ramus with early callus formation (series 3, image 95). These fractures appear unchanged in alignment  compared to radiographic study of 06/21/2019. Pubic symphysis is intact with moderate arthropathy including chondrocalcinosis. Nondisplaced mildly comminuted fracture involving the peripheral aspect of the right sacroiliac joint (series 5, image 60) with extension into the right S1 neural foramen (series 3, image 33). Possible extension to the right S2 foramen (series 3, image 35). Fracture extends to the superior aspect of the right SI joint. No SI joint diastasis. There is mild sclerosis within the right hemi sacrum suggesting subacute chronicity. Nondisplaced fracture of the left sacral ala with involvement of the left SI joint (series 5, images 61-68). No SI joint diastasis. No evidence of fracture extension into the sacral foramina. Remaining included osseous structures appear intact. There is moderate right and mild-to-moderate left hip osteoarthritis. No hip fracture or dislocation. No lytic or sclerotic bone lesion is seen. Ligaments Suboptimally assessed by CT. Muscles and Tendons Rounded fluid collection within the left obturator internus muscle measuring up to 2.4 cm (series 7, image 66; series 4, image 81) adjacent to left inferior pubic ramus fracture. No acute tendinous abnormality within the limitations of CT. Soft tissues Remaining soft tissues appear within normal limits. IMPRESSION: 1. Subacute appearing nondisplaced bilateral sacral alar fractures, as described above. 2. Subacute fractures of the left superior and inferior pubic rami. These fractures appear unchanged in alignment compared to radiographic study of 06/21/2019. 3. Rounded fluid collection within the left obturator internus muscle measuring up to 2.4 cm adjacent to the left inferior pubic ramus fracture, likely reflecting an involving hematoma. Electronically Signed   By: Davina Poke D.O.   On: 07/04/2019 11:31    EKG: Independently reviewed.   Assessment/Plan Principal Problem:   Sacral fracture, closed (HCC) Active  Problems:   Type 2 diabetes mellitus with stage 4 chronic kidney disease (HCC)   Anemia in chronic kidney disease (CKD)   Anxiety associated with depression   Fall   Bilateral pubic rami fractures, sequela     Sacral fracture/bilateral pubic rami fracture Status post fall and presents for evaluation of pain in the coccyx area Place patient on pain control as well as muscle relaxants Orthopedic evaluation Fall precautions   Type 2 diabetes mellitus with complications of stage IV chronic kidney disease Maintain consistent carbohydrate diet Glycemic control with sliding scale insulin   Anemia of chronic kidney disease H&H is stable Monitor closely especially following CT scan findings of hematoma in the pelvis   Hyponatremia Unclear etiology and may be related to SSRI use Gentle  IV fluid hydration   Hypertension Blood pressure stable on lisinopril   Hypothyroidism Continue Synthroid   Depression  Continue Lexapro   Urinary retention Patient with complaints of urinary incontinence associated with frequency of urination. Suprapubic tenderness on exam and bladder scan with > 699cc of urine. Will place Foley catheter   DVT prophylaxis:  SCD Code Status: Full code Family Communication: Greater than 50% of time discussing plan of care with patient and her son at bedside. They verbalized understanding and agree with the plan. Disposition Plan: Back to previous home environment Consults called: Orthopedics    Terressa Evola MD [facility]     07/04/2019, 2:42 PM

## 2019-07-04 NOTE — ED Notes (Signed)
Report given to 2C 

## 2019-07-04 NOTE — ED Notes (Signed)
See triage note  Presents with tailbone pain  States she fell about 1 month ago  Was able to get up and walk  States she then developed pain into right groin/hip area   States she was seen again by PCP and Ortho  States they told her that there was something wrong with her pelvis   State snow that pain has eased off   But having increased pain at tailbone

## 2019-07-05 DIAGNOSIS — F418 Other specified anxiety disorders: Secondary | ICD-10-CM

## 2019-07-05 DIAGNOSIS — S32591S Other specified fracture of right pubis, sequela: Secondary | ICD-10-CM

## 2019-07-05 DIAGNOSIS — W19XXXD Unspecified fall, subsequent encounter: Secondary | ICD-10-CM

## 2019-07-05 DIAGNOSIS — N184 Chronic kidney disease, stage 4 (severe): Secondary | ICD-10-CM

## 2019-07-05 DIAGNOSIS — S32592S Other specified fracture of left pubis, sequela: Secondary | ICD-10-CM

## 2019-07-05 DIAGNOSIS — D631 Anemia in chronic kidney disease: Secondary | ICD-10-CM

## 2019-07-05 DIAGNOSIS — S3210XS Unspecified fracture of sacrum, sequela: Secondary | ICD-10-CM

## 2019-07-05 LAB — CBC
HCT: 28.7 % — ABNORMAL LOW (ref 36.0–46.0)
Hemoglobin: 10 g/dL — ABNORMAL LOW (ref 12.0–15.0)
MCH: 31.4 pg (ref 26.0–34.0)
MCHC: 34.8 g/dL (ref 30.0–36.0)
MCV: 90.3 fL (ref 80.0–100.0)
Platelets: 386 10*3/uL (ref 150–400)
RBC: 3.18 MIL/uL — ABNORMAL LOW (ref 3.87–5.11)
RDW: 13 % (ref 11.5–15.5)
WBC: 8.8 10*3/uL (ref 4.0–10.5)
nRBC: 0 % (ref 0.0–0.2)

## 2019-07-05 LAB — BASIC METABOLIC PANEL
Anion gap: 7 (ref 5–15)
BUN: 30 mg/dL — ABNORMAL HIGH (ref 8–23)
CO2: 25 mmol/L (ref 22–32)
Calcium: 8.6 mg/dL — ABNORMAL LOW (ref 8.9–10.3)
Chloride: 99 mmol/L (ref 98–111)
Creatinine, Ser: 1.33 mg/dL — ABNORMAL HIGH (ref 0.44–1.00)
GFR calc Af Amer: 42 mL/min — ABNORMAL LOW (ref >60)
GFR calc non Af Amer: 36 mL/min — ABNORMAL LOW (ref >60)
Glucose, Bld: 130 mg/dL — ABNORMAL HIGH (ref 70–99)
Potassium: 4.8 mmol/L (ref 3.5–5.1)
Sodium: 131 mmol/L — ABNORMAL LOW (ref 135–145)

## 2019-07-05 MED ORDER — CHLORHEXIDINE GLUCONATE CLOTH 2 % EX PADS
6.0000 | MEDICATED_PAD | Freq: Every day | CUTANEOUS | Status: DC
  Administered 2019-07-05 – 2019-07-07 (×3): 6 via TOPICAL

## 2019-07-05 MED ORDER — CEFAZOLIN SODIUM-DEXTROSE 1-4 GM/50ML-% IV SOLN
1.0000 g | Freq: Once | INTRAVENOUS | Status: AC
  Administered 2019-07-06: 1 g via INTRAVENOUS
  Filled 2019-07-05: qty 50

## 2019-07-05 NOTE — Progress Notes (Signed)
PROGRESS NOTE    Lisa Mills  AJO:[id number] DOB: [birth year] DOA: 07/04/2019 PCP: Olin Hauser, DO   Brief Narrative:  Lisa Mills is a 86 y.o. female with medical history significant for hypertension, GERD who presents to the emergency room for evaluation of pain in her tailbone area as well as hip pain following a mechanical fall about a month ago.  Pain is usually worse with ambulation.  Patient was told she had a fracture in her pelvis and has been on tramadol for pain control without any improvement.  She was then started on oxycodone which has not helped with her pain control. CT pelvis with nondisplaced bilateral sacral alar fractures.  And a transverse S1 fracture. Admitted for pain control and going for sacral plasty tomorrow.  Subjective: According to patient pain increased with any movement, it remain bearable if she lays still.  Son was present in the room.  Assessment & Plan:   Principal Problem:   Sacral fracture, closed ([place]) Active Problems:   Type 2 diabetes mellitus with stage 4 chronic kidney disease (HCC)   Anemia in chronic kidney disease (CKD)   Anxiety associated with depression   Fall   Bilateral pubic rami fractures, sequela  Sacral fracture/bilateral pubic rami fracture. Patient was admitted for pain control and is going for sacral plasty with orthopedic tomorrow. -Continue pain management. -PT/OT evaluation.  Type 2 diabetes mellitus with complications of stage IV chronic kidney disease.  -Continue SSI.  CKD stage IV.  Creatinine improved to 1.33 today. -Continue to monitor renal function. -Avoid nephrotoxins..  Hyponatremia.  Patient had mild hyponatremia with sodium of 131 this morning.  She was on SSRI. -Continue to monitor.  Anemia of chronic kidney disease H&H is stable.  Patient did had a small hematoma on CT. -Continue to monitor  Hypertension.  Blood pressure within goal. -Continue home dose of lisinopril.  History  of depression.  No acute concern. -Continue home dose of Lexapro.  Urinary retention.  Patient was complaining of urinary incontinence on admission.  Bladder scan with more than 6 9 9  cc.  Foley was placed. UA with mild pyuria-remained afebrile and no leukocytosis. -Urine culture-we will treat if positive due to positive symptoms.  Objective: Vitals:   07/05/19 0024 07/05/19 0416 07/05/19 0809 07/05/19 1505  BP: (!) 115/55 (!) 125/56 113/63 (!) 109/45  Pulse: 82 80 79 74  Resp: 16  15 18   Temp: 99.1 F (37.3 C) 98.6 F (37 C) 98.7 F (37.1 C) 98.3 F (36.8 C)  TempSrc: Oral Oral Oral Oral  SpO2: 94% 94% 93% 93%  Weight:      Height:        Intake/Output Summary (Last 24 hours) at 07/05/2019 1633 Last data filed at 07/05/2019 1502 Gross per 24 hour  Intake 1243.94 ml  Output 1575 ml  Net -331.06 ml   Filed Weights   07/04/19 0936  Weight: 57 kg    Examination:  General exam: Appears calm and comfortable  Respiratory system: Clear to auscultation. Respiratory effort normal. Cardiovascular system: S1 & S2 heard, RRR. No JVD, murmurs, rubs,  Gastrointestinal system: Soft, nontender, nondistended, bowel sounds positive. Central nervous system: Alert and oriented. No focal neurological deficits. Extremities: No edema, no cyanosis, pulses intact and symmetrical. Skin: No rashes, lesions or ulcers Psychiatry: Judgement and insight appear normal. Mood & affect appropriate.    DVT prophylaxis: SCDs Code Status: Full Family Communication: Was updated at bedside. Disposition Plan:  Status is: Inpatient  Remains inpatient appropriate because:Inpatient level of care appropriate due to severity of illness   Dispo: The patient is from: Home              Anticipated d/c is to: To be determined              Anticipated d/c date is: 2 days              Patient currently is not medically stable to d/c.   Consultants:   Orthopedic  Procedures:  Antimicrobials:   Data  Reviewed: I have personally reviewed following labs and imaging studies  CBC: Recent Labs  Lab 07/04/19 1220 07/05/19 0444  WBC 13.8* 8.8  NEUTROABS 10.6*  --   HGB 10.5* 10.0*  HCT 31.5* 28.7*  MCV 92.6 90.3  PLT 419* 242   Basic Metabolic Panel: Recent Labs  Lab 07/04/19 1220 07/05/19 0444  NA 129* 131*  K 4.4 4.8  CL 94* 99  CO2 24 25  GLUCOSE 134* 130*  BUN 39* 30*  CREATININE 1.63* 1.33*  CALCIUM 9.2 8.6*   GFR: Estimated Creatinine Clearance: 22.9 mL/min (A) (by C-G formula based on SCr of 1.33 mg/dL (H)). Liver Function Tests: No results for input(s): AST, ALT, ALKPHOS, BILITOT, PROT, ALBUMIN in the last 168 hours. No results for input(s): LIPASE, AMYLASE in the last 168 hours. No results for input(s): AMMONIA in the last 168 hours. Coagulation Profile: No results for input(s): INR, PROTIME in the last 168 hours. Cardiac Enzymes: No results for input(s): CKTOTAL, CKMB, CKMBINDEX, TROPONINI in the last 168 hours. BNP (last 3 results) No results for input(s): PROBNP in the last 8760 hours. HbA1C: No results for input(s): HGBA1C in the last 72 hours. CBG: No results for input(s): GLUCAP in the last 168 hours. Lipid Profile: No results for input(s): CHOL, HDL, LDLCALC, TRIG, CHOLHDL, LDLDIRECT in the last 72 hours. Thyroid Function Tests: No results for input(s): TSH, T4TOTAL, FREET4, T3FREE, THYROIDAB in the last 72 hours. Anemia Panel: No results for input(s): VITAMINB12, FOLATE, FERRITIN, TIBC, IRON, RETICCTPCT in the last 72 hours. Sepsis Labs: No results for input(s): PROCALCITON, LATICACIDVEN in the last 168 hours.  Recent Results (from the past 240 hour(s))  SARS Coronavirus 2 by RT PCR (hospital order, performed in [facility] hospital lab) Nasopharyngeal Nasopharyngeal Swab     Status: None   Collection Time: 07/04/19 12:58 PM   Specimen: Nasopharyngeal Swab  Result Value Ref Range Status   SARS Coronavirus 2 NEGATIVE NEGATIVE Final    Comment:  (NOTE) SARS-CoV-2 target nucleic acids are NOT DETECTED.  The SARS-CoV-2 RNA is generally detectable in upper and lower respiratory specimens during the acute phase of infection. The lowest concentration of SARS-CoV-2 viral copies this assay can detect is 250 copies / mL. A negative result does not preclude SARS-CoV-2 infection and should not be used as the sole basis for treatment or other patient management decisions.  A negative result may occur with improper specimen collection / handling, submission of specimen other than nasopharyngeal swab, presence of viral mutation(s) within the areas targeted by this assay, and inadequate number of viral copies (<250 copies / mL). A negative result must be combined with clinical observations, patient history, and epidemiological information.  Fact Sheet for Patients:   StrictlyIdeas.no  Fact Sheet for Healthcare Providers: BankingDealers.co.za  This test is not yet approved or  cleared by the [place] FDA and has been authorized for detection and/or diagnosis of SARS-CoV-2 by FDA under an  Emergency Use Authorization (EUA).  This EUA will remain in effect (meaning this test can be used) for the duration of the COVID-19 declaration under Section 564(b)(1) of the Act, 21 U.S.C. section 360bbb-3(b)(1), unless the authorization is terminated or revoked sooner.  Performed at [facility], [address], [place], [place] [address]      Radiology Studies: CT Lumbar Spine Wo Contrast  Result Date: 07/04/2019 CLINICAL DATA:  Fall 1 month ago.  Pain in tailbone.  Low back pain. EXAM: CT LUMBAR SPINE WITHOUT CONTRAST TECHNIQUE: Multidetector CT imaging of the lumbar spine was performed without intravenous contrast administration. Multiplanar CT image reconstructions were also generated. COMPARISON:  None. FINDINGS: Segmentation: 5 non rib-bearing lumbar type vertebral bodies are present.  The lowest fully formed vertebral body is L5. Alignment: Grade 1 anterolisthesis at L4-5 measures 5 mm. Slight retrolisthesis present at L2-3 and L3-4. Vertebrae: Vertebral body heights are maintained. No acute vertebral fracture is present. Bilateral sacral ala fractures are present. Fracture is somewhat more prominent on the right than on the left. Transverse S1 fracture is present. Paraspinal and other soft tissues: Atherosclerotic calcifications are present in the aorta without aneurysm. Benign appearing cyst is noted anteriorly in the right kidney. No other solid organ lesions are present. Disc levels: L1-2: Leftward disc bulging is present. No significant stenosis is present. L2-3: A broad-based disc protrusion is present. Moderate facet hypertrophy is present. Mild central canal narrowing is present. Moderate foraminal stenosis is present bilaterally. L3-4: A broad-based disc protrusion is present. Advanced facet hypertrophy is noted bilaterally. No significant stenosis is present. L4-5: A broad-based disc protrusion is present. Advanced facet hypertrophy and spurring is noted bilaterally. Mild central canal narrowing is worse on the left. Moderate foraminal stenosis is worse on left. L5-S1: Moderate facet hypertrophy is noted bilaterally. Mild disc bulging is present. Moderate foraminal narrowing present bilaterally. The central canal is patent. IMPRESSION: 1. Bilateral sacral ala fractures and transverse S1 fracture appear acute and likely are the source of the patient's pain. 2. Multilevel spondylosis of the lumbar spine as described. 3. Mild central canal narrowing and moderate foraminal stenosis bilaterally at L2-3 and L4-5. 4. Moderate foraminal stenosis bilaterally at L5-S1. 5. Aortic Atherosclerosis (ICD10-I70.0). Electronically Signed   By: San Morelle M.D.   On: 07/04/2019 11:32   CT PELVIS WO CONTRAST  Result Date: 07/04/2019 CLINICAL DATA:  Pelvic pain.  Fall approximately 1 month ago  EXAM: CT PELVIS WITHOUT CONTRAST TECHNIQUE: Multidetector CT imaging of the pelvis was performed following the standard protocol without intravenous contrast. COMPARISON:  X-ray 06/21/2019 FINDINGS: Bones/Joint/Cartilage Subacute displaced fracture of the parasymphyseal aspect of the left pubic bone with involvement of the superior pubic ramus with moderate angulation and early callus formation (series 5, image 40). Moderately displaced fracture of the mid left inferior pubic ramus with early callus formation (series 3, image 95). These fractures appear unchanged in alignment compared to radiographic study of 06/21/2019. Pubic symphysis is intact with moderate arthropathy including chondrocalcinosis. Nondisplaced mildly comminuted fracture involving the peripheral aspect of the right sacroiliac joint (series 5, image 60) with extension into the right S1 neural foramen (series 3, image 33). Possible extension to the right S2 foramen (series 3, image 35). Fracture extends to the superior aspect of the right SI joint. No SI joint diastasis. There is mild sclerosis within the right hemi sacrum suggesting subacute chronicity. Nondisplaced fracture of the left sacral ala with involvement of the left SI joint (series 5, images 61-68). No SI  joint diastasis. No evidence of fracture extension into the sacral foramina. Remaining included osseous structures appear intact. There is moderate right and mild-to-moderate left hip osteoarthritis. No hip fracture or dislocation. No lytic or sclerotic bone lesion is seen. Ligaments Suboptimally assessed by CT. Muscles and Tendons Rounded fluid collection within the left obturator internus muscle measuring up to 2.4 cm (series 7, image 66; series 4, image 81) adjacent to left inferior pubic ramus fracture. No acute tendinous abnormality within the limitations of CT. Soft tissues Remaining soft tissues appear within normal limits. IMPRESSION: 1. Subacute appearing nondisplaced bilateral  sacral alar fractures, as described above. 2. Subacute fractures of the left superior and inferior pubic rami. These fractures appear unchanged in alignment compared to radiographic study of 06/21/2019. 3. Rounded fluid collection within the left obturator internus muscle measuring up to 2.4 cm adjacent to the left inferior pubic ramus fracture, likely reflecting an involving hematoma. Electronically Signed   By: Davina Poke D.O.   On: 07/04/2019 11:31    Scheduled Meds: . Chlorhexidine Gluconate Cloth  6 each Topical Daily  . cholecalciferol  1,000 Units Oral Daily  . cyclobenzaprine  5 mg Oral TID  . escitalopram  5 mg Oral Daily  . levothyroxine  25 mcg Oral Q0600  . lisinopril  5 mg Oral Daily  . pantoprazole  40 mg Oral Daily  . senna  1 tablet Oral BID  . simvastatin  40 mg Oral QHS   Continuous Infusions: . sodium chloride 100 mL/hr at 07/05/19 1437  . [START ON 07/06/2019]  ceFAZolin (ANCEF) IV       LOS: 1 day   Time spent: 40 minutes.  Lorella Nimrod, MD [facility]  If 7PM-7AM, please contact night-coverage [email or web address]  07/05/2019, 4:33 PM   This record has been created using Systems analyst. Errors have been sought and corrected,but may not always be located. Such creation errors do not reflect on the standard of care.

## 2019-07-05 NOTE — Consult Note (Signed)
Reason for Consult: Sacrum fracture Referring Physician: Dr Gardiner Fanti is an 86 y.o. female.  HPI:  Lisa Mills is a 86 y.o. female with medical history significant for hypertension, GERD who presents to the emergency room for evaluation of pain in her tailbone area as well as hip pain following a mechanical fall about a month ago.  Pain is usually worse with ambulation.  Patient was told she had a fracture in her pelvis and has been on tramadol for pain control without any improvement.  She was then started on oxycodone which has not helped with her pain control.  She denies having any recent falls since then  Past Medical History:  Diagnosis Date  . Dermatophytosis of nail   . GERD (gastroesophageal reflux disease)   . History of shingles   . Hypertension   . Keratoderma     Past Surgical History:  Procedure Laterality Date  . BREAST BIOPSY Right 09/30/2017   Affirm bx-calcs ( X clip),benign  . BREAST EXCISIONAL BIOPSY Right    neg  . cataracts    . COLONOSCOPY W/ POLYPECTOMY    . COLONOSCOPY WITH PROPOFOL N/A 02/10/2017   Procedure: COLONOSCOPY WITH PROPOFOL;  Surgeon: Manya Silvas, MD;  Location: [facility] ENDOSCOPY;  Service: Endoscopy;  Laterality: N/A;  . ESOPHAGOGASTRODUODENOSCOPY (EGD) WITH PROPOFOL N/A 02/10/2017   Procedure: ESOPHAGOGASTRODUODENOSCOPY (EGD) WITH PROPOFOL;  Surgeon: Manya Silvas, MD;  Location: [facility] ENDOSCOPY;  Service: Endoscopy;  Laterality: N/A;  . EYE SURGERY     bilateral cataracts  . HAND SURGERY    . KIDNEY STONE SURGERY    . TONSILECTOMY, ADENOIDECTOMY, BILATERAL MYRINGOTOMY AND TUBES    . TONSILLECTOMY    . TRIGGER FINGER RELEASE     Left ring finger  . tubercular peritonitis      Family History  Adopted: Yes  Problem Relation Age of Onset  . Breast cancer Neg Hx     Social History:  reports that she has never smoked. She has never used smokeless tobacco. She reports previous alcohol use. She reports that she does not  use drugs.  Allergies:  Allergies  Allergen Reactions  . Aspirin Other (See Comments)    Burns stomach  . Conray [Iothalamate] Hives    IV dye conray-400  . Dye Fdc Red [Red Dye] Hives  . Sulfa Antibiotics     Unknown Reaction, not used in years  . Prednisone     Indigestion     Medications: I have reviewed the patient's current medications.  Results for orders placed or performed during the hospital encounter of 07/04/19 (from the past 48 hour(s))  Basic metabolic panel     Status: Abnormal   Collection Time: 07/04/19 12:20 PM  Result Value Ref Range   Sodium 129 (L) 135 - 145 mmol/L   Potassium 4.4 3.5 - 5.1 mmol/L   Chloride 94 (L) 98 - 111 mmol/L   CO2 24 22 - 32 mmol/L   Glucose, Bld 134 (H) 70 - 99 mg/dL    Comment: Glucose reference range applies only to samples taken after fasting for at least 8 hours.   BUN 39 (H) 8 - 23 mg/dL   Creatinine, Ser 1.63 (H) 0.44 - 1.00 mg/dL   Calcium 9.2 8.9 - 10.3 mg/dL   GFR calc non Af Amer 28 (L) >60 mL/min   GFR calc Af Amer 32 (L) >60 mL/min   Anion gap 11 5 - 15    Comment: Performed at  [facility], [address] [place], [place] [address]  CBC with Differential     Status: Abnormal   Collection Time: 07/04/19 12:20 PM  Result Value Ref Range   WBC 13.8 (H) 4.0 - 10.5 K/uL   RBC 3.40 (L) 3.87 - 5.11 MIL/uL   Hemoglobin 10.5 (L) 12.0 - 15.0 g/dL   HCT 31.5 (L) 36 - 46 %   MCV 92.6 80.0 - 100.0 fL   MCH 30.9 26.0 - 34.0 pg   MCHC 33.3 30.0 - 36.0 g/dL   RDW 12.8 11.5 - 15.5 %   Platelets 419 (H) 150 - 400 K/uL   nRBC 0.0 0.0 - 0.2 %   Neutrophils Relative % 76 %   Neutro Abs 10.6 (H) 1.7 - 7.7 K/uL   Lymphocytes Relative 14 %   Lymphs Abs 1.9 0.7 - 4.0 K/uL   Monocytes Relative 8 %   Monocytes Absolute 1.1 (H) 0 - 1 K/uL   Eosinophils Relative 1 %   Eosinophils Absolute 0.1 0 - 0 K/uL   Basophils Relative 0 %   Basophils Absolute 0.1 0 - 0 K/uL   Immature Granulocytes 1 %   Abs Immature Granulocytes  0.12 (H) 0.00 - 0.07 K/uL    Comment: Performed at [facility], [address] [place], [place] [address]  SARS Coronavirus 2 by RT PCR (hospital order, performed in [facility] hospital lab) Nasopharyngeal Nasopharyngeal Swab     Status: None   Collection Time: 07/04/19 12:58 PM   Specimen: Nasopharyngeal Swab  Result Value Ref Range   SARS Coronavirus 2 NEGATIVE NEGATIVE    Comment: (NOTE) SARS-CoV-2 target nucleic acids are NOT DETECTED.  The SARS-CoV-2 RNA is generally detectable in upper and lower respiratory specimens during the acute phase of infection. The lowest concentration of SARS-CoV-2 viral copies this assay can detect is 250 copies / mL. A negative result does not preclude SARS-CoV-2 infection and should not be used as the sole basis for treatment or other patient management decisions.  A negative result may occur with improper specimen collection / handling, submission of specimen other than nasopharyngeal swab, presence of viral mutation(s) within the areas targeted by this assay, and inadequate number of viral copies (<250 copies / mL). A negative result must be combined with clinical observations, patient history, and epidemiological information.  Fact Sheet for Patients:   StrictlyIdeas.no  Fact Sheet for Healthcare Providers: BankingDealers.co.za  This test is not yet approved or  cleared by the [place] FDA and has been authorized for detection and/or diagnosis of SARS-CoV-2 by FDA under an Emergency Use Authorization (EUA).  This EUA will remain in effect (meaning this test can be used) for the duration of the COVID-19 declaration under Section 564(b)(1) of the Act, 21 U.S.C. section 360bbb-3(b)(1), unless the authorization is terminated or revoked sooner.  Performed at [facility], [address] [place],  [address]   Urinalysis, Complete w Microscopic     Status: Abnormal    Collection Time: 07/04/19  3:55 PM  Result Value Ref Range   Color, Urine YELLOW (A) YELLOW   APPearance HAZY (A) CLEAR   Specific Gravity, Urine 1.011 1.005 - 1.030   pH 5.0 5.0 - 8.0   Glucose, UA NEGATIVE NEGATIVE mg/dL   Hgb urine dipstick NEGATIVE NEGATIVE   Bilirubin Urine NEGATIVE NEGATIVE   Ketones, ur NEGATIVE NEGATIVE mg/dL   Protein, ur NEGATIVE NEGATIVE mg/dL   Nitrite NEGATIVE NEGATIVE   Leukocytes,Ua SMALL (A)  NEGATIVE   RBC / HPF 0-5 0 - 5 RBC/hpf   WBC, UA 6-10 0 - 5 WBC/hpf   Bacteria, UA RARE (A) NONE SEEN   Squamous Epithelial / LPF 6-10 0 - 5   Mucus PRESENT     Comment: Performed at [facility], [address] [place], [place] [address]  CBC     Status: Abnormal   Collection Time: 07/05/19  4:44 AM  Result Value Ref Range   WBC 8.8 4.0 - 10.5 K/uL   RBC 3.18 (L) 3.87 - 5.11 MIL/uL   Hemoglobin 10.0 (L) 12.0 - 15.0 g/dL   HCT 28.7 (L) 36 - 46 %   MCV 90.3 80.0 - 100.0 fL   MCH 31.4 26.0 - 34.0 pg   MCHC 34.8 30.0 - 36.0 g/dL   RDW 13.0 11.5 - 15.5 %   Platelets 386 150 - 400 K/uL   nRBC 0.0 0.0 - 0.2 %    Comment: Performed at [facility], [address] [place], [place] [address]  Basic metabolic panel     Status: Abnormal   Collection Time: 07/05/19  4:44 AM  Result Value Ref Range   Sodium 131 (L) 135 - 145 mmol/L   Potassium 4.8 3.5 - 5.1 mmol/L   Chloride 99 98 - 111 mmol/L   CO2 25 22 - 32 mmol/L   Glucose, Bld 130 (H) 70 - 99 mg/dL    Comment: Glucose reference range applies only to samples taken after fasting for at least 8 hours.   BUN 30 (H) 8 - 23 mg/dL   Creatinine, Ser 1.33 (H) 0.44 - 1.00 mg/dL   Calcium 8.6 (L) 8.9 - 10.3 mg/dL   GFR calc non Af Amer 36 (L) >60 mL/min   GFR calc Af Amer 42 (L) >60 mL/min   Anion gap 7 5 - 15    Comment: Performed at [facility], [address], [place], [place] [address]    CT Lumbar Spine Wo Contrast  Result Date: 07/04/2019 CLINICAL DATA:  Fall 1 month ago.   Pain in tailbone.  Low back pain. EXAM: CT LUMBAR SPINE WITHOUT CONTRAST TECHNIQUE: Multidetector CT imaging of the lumbar spine was performed without intravenous contrast administration. Multiplanar CT image reconstructions were also generated. COMPARISON:  None. FINDINGS: Segmentation: 5 non rib-bearing lumbar type vertebral bodies are present. The lowest fully formed vertebral body is L5. Alignment: Grade 1 anterolisthesis at L4-5 measures 5 mm. Slight retrolisthesis present at L2-3 and L3-4. Vertebrae: Vertebral body heights are maintained. No acute vertebral fracture is present. Bilateral sacral ala fractures are present. Fracture is somewhat more prominent on the right than on the left. Transverse S1 fracture is present. Paraspinal and other soft tissues: Atherosclerotic calcifications are present in the aorta without aneurysm. Benign appearing cyst is noted anteriorly in the right kidney. No other solid organ lesions are present. Disc levels: L1-2: Leftward disc bulging is present. No significant stenosis is present. L2-3: A broad-based disc protrusion is present. Moderate facet hypertrophy is present. Mild central canal narrowing is present. Moderate foraminal stenosis is present bilaterally. L3-4: A broad-based disc protrusion is present. Advanced facet hypertrophy is noted bilaterally. No significant stenosis is present. L4-5: A broad-based disc protrusion is present. Advanced facet hypertrophy and spurring is noted bilaterally. Mild central canal narrowing is worse on the left. Moderate foraminal stenosis is worse on left. L5-S1: Moderate facet hypertrophy is noted bilaterally. Mild disc bulging is present. Moderate foraminal narrowing present bilaterally. The  central canal is patent. IMPRESSION: 1. Bilateral sacral ala fractures and transverse S1 fracture appear acute and likely are the source of the patient's pain. 2. Multilevel spondylosis of the lumbar spine as described. 3. Mild central canal  narrowing and moderate foraminal stenosis bilaterally at L2-3 and L4-5. 4. Moderate foraminal stenosis bilaterally at L5-S1. 5. Aortic Atherosclerosis (ICD10-I70.0). Electronically Signed   By: San Morelle M.D.   On: 07/04/2019 11:32   CT PELVIS WO CONTRAST  Result Date: 07/04/2019 CLINICAL DATA:  Pelvic pain.  Fall approximately 1 month ago EXAM: CT PELVIS WITHOUT CONTRAST TECHNIQUE: Multidetector CT imaging of the pelvis was performed following the standard protocol without intravenous contrast. COMPARISON:  X-ray 06/21/2019 FINDINGS: Bones/Joint/Cartilage Subacute displaced fracture of the parasymphyseal aspect of the left pubic bone with involvement of the superior pubic ramus with moderate angulation and early callus formation (series 5, image 40). Moderately displaced fracture of the mid left inferior pubic ramus with early callus formation (series 3, image 95). These fractures appear unchanged in alignment compared to radiographic study of 06/21/2019. Pubic symphysis is intact with moderate arthropathy including chondrocalcinosis. Nondisplaced mildly comminuted fracture involving the peripheral aspect of the right sacroiliac joint (series 5, image 60) with extension into the right S1 neural foramen (series 3, image 33). Possible extension to the right S2 foramen (series 3, image 35). Fracture extends to the superior aspect of the right SI joint. No SI joint diastasis. There is mild sclerosis within the right hemi sacrum suggesting subacute chronicity. Nondisplaced fracture of the left sacral ala with involvement of the left SI joint (series 5, images 61-68). No SI joint diastasis. No evidence of fracture extension into the sacral foramina. Remaining included osseous structures appear intact. There is moderate right and mild-to-moderate left hip osteoarthritis. No hip fracture or dislocation. No lytic or sclerotic bone lesion is seen. Ligaments Suboptimally assessed by CT. Muscles and Tendons  Rounded fluid collection within the left obturator internus muscle measuring up to 2.4 cm (series 7, image 66; series 4, image 81) adjacent to left inferior pubic ramus fracture. No acute tendinous abnormality within the limitations of CT. Soft tissues Remaining soft tissues appear within normal limits. IMPRESSION: 1. Subacute appearing nondisplaced bilateral sacral alar fractures, as described above. 2. Subacute fractures of the left superior and inferior pubic rami. These fractures appear unchanged in alignment compared to radiographic study of 06/21/2019. 3. Rounded fluid collection within the left obturator internus muscle measuring up to 2.4 cm adjacent to the left inferior pubic ramus fracture, likely reflecting an involving hematoma. Electronically Signed   By: Davina Poke D.O.   On: 07/04/2019 11:31    Review of Systems Blood pressure 113/63, pulse 79, temperature 98.7 F (37.1 C), temperature source Oral, resp. rate 15, height 4\' 11"  (1.499 m), weight 57 kg, SpO2 93 %. Physical Exam Tender to palpation at the sacrum Assessment/Plan: Sacral fractures with severe pain and inability to ambulate Plan is for sacroplasty tomorrow  Hessie Knows 07/05/2019, 1:04 PM

## 2019-07-05 NOTE — Plan of Care (Signed)
°  Problem: Education: Goal: Knowledge of General Education information will improve Description: Including pain rating scale, medication(s)/side effects and non-pharmacologic comfort measures Outcome: Progressing   Problem: Coping: Goal: Level of anxiety will decrease Outcome: Progressing   Problem: Elimination: Goal: Will not experience complications related to urinary retention Outcome: Not Progressing Note: Urinary catheter placed this shift due to urinary retention

## 2019-07-06 ENCOUNTER — Inpatient Hospital Stay: Payer: PPO | Admitting: Certified Registered Nurse Anesthetist

## 2019-07-06 ENCOUNTER — Inpatient Hospital Stay: Payer: PPO

## 2019-07-06 ENCOUNTER — Encounter
Admission: AD | Disposition: A | Discharge status: Home / Self Care | Payer: Self-pay | Source: Home / Self Care | Attending: Internal Medicine

## 2019-07-06 ENCOUNTER — Encounter: Payer: Self-pay | Admitting: Internal Medicine

## 2019-07-06 HISTORY — PX: SACROPLASTY: SHX6797

## 2019-07-06 LAB — URINE CULTURE

## 2019-07-06 LAB — CBC
HCT: 25.4 % — ABNORMAL LOW (ref 36.0–46.0)
Hemoglobin: 8.9 g/dL — ABNORMAL LOW (ref 12.0–15.0)
MCH: 31.8 pg (ref 26.0–34.0)
MCHC: 35 g/dL (ref 30.0–36.0)
MCV: 90.7 fL (ref 80.0–100.0)
Platelets: 347 10*3/uL (ref 150–400)
RBC: 2.8 MIL/uL — ABNORMAL LOW (ref 3.87–5.11)
RDW: 13.1 % (ref 11.5–15.5)
WBC: 8.3 10*3/uL (ref 4.0–10.5)
nRBC: 0 % (ref 0.0–0.2)

## 2019-07-06 LAB — GLUCOSE, CAPILLARY
Glucose-Capillary: 108 mg/dL — ABNORMAL HIGH (ref 70–99)
Glucose-Capillary: 101 mg/dL — ABNORMAL HIGH (ref 70–99)
Glucose-Capillary: 118 mg/dL — ABNORMAL HIGH (ref 70–99)
Glucose-Capillary: 192 mg/dL — ABNORMAL HIGH (ref 70–99)

## 2019-07-06 LAB — BASIC METABOLIC PANEL
Anion gap: 7 (ref 5–15)
BUN: 21 mg/dL (ref 8–23)
CO2: 22 mmol/L (ref 22–32)
Calcium: 8.6 mg/dL — ABNORMAL LOW (ref 8.9–10.3)
Chloride: 104 mmol/L (ref 98–111)
Creatinine, Ser: 1.13 mg/dL — ABNORMAL HIGH (ref 0.44–1.00)
GFR calc Af Amer: 51 mL/min — ABNORMAL LOW (ref >60)
GFR calc non Af Amer: 44 mL/min — ABNORMAL LOW (ref >60)
Glucose, Bld: 147 mg/dL — ABNORMAL HIGH (ref 70–99)
Potassium: 4.7 mmol/L (ref 3.5–5.1)
Sodium: 133 mmol/L — ABNORMAL LOW (ref 135–145)

## 2019-07-06 SURGERY — SACROPLASTY
Anesthesia: General

## 2019-07-06 MED ORDER — LIDOCAINE HCL (PF) 1 % IJ SOLN
INTRAMUSCULAR | Status: DC | PRN
  Administered 2019-07-06 (×3): 10 mL

## 2019-07-06 MED ORDER — PROPOFOL 500 MG/50ML IV EMUL
INTRAVENOUS | Status: DC | PRN
  Administered 2019-07-06: 25 ug/kg/min via INTRAVENOUS

## 2019-07-06 MED ORDER — ONDANSETRON HCL 4 MG/2ML IJ SOLN: 4.0000 mg | Freq: Once | INTRAMUSCULAR | Status: DC | PRN

## 2019-07-06 MED ORDER — BUPIVACAINE-EPINEPHRINE (PF) 0.25% -1:200000 IJ SOLN
INTRAMUSCULAR | Status: AC
  Filled 2019-07-06: qty 30

## 2019-07-06 MED ORDER — KETAMINE HCL 50 MG/ML IJ SOLN
INTRAMUSCULAR | Status: DC | PRN
  Administered 2019-07-06: 10 mg via INTRAVENOUS
  Administered 2019-07-06: 15 mg via INTRAVENOUS

## 2019-07-06 MED ORDER — LIDOCAINE 2% (20 MG/ML) 5 ML SYRINGE
INTRAMUSCULAR | Status: DC | PRN
  Administered 2019-07-06: 50 mg via INTRAVENOUS

## 2019-07-06 MED ORDER — BUPIVACAINE-EPINEPHRINE (PF) 0.5% -1:200000 IJ SOLN
INTRAMUSCULAR | Status: DC | PRN
  Administered 2019-07-06 (×2): 10 mL via PERINEURAL

## 2019-07-06 MED ORDER — LIDOCAINE HCL (PF) 2 % IJ SOLN
INTRAMUSCULAR | Status: AC
  Filled 2019-07-06: qty 5

## 2019-07-06 MED ORDER — PROPOFOL 10 MG/ML IV BOLUS
INTRAVENOUS | Status: DC | PRN
  Administered 2019-07-06: 25 mg via INTRAVENOUS

## 2019-07-06 MED ORDER — FENTANYL CITRATE (PF) 100 MCG/2ML IJ SOLN: 25.0000 ug | INTRAMUSCULAR | Status: DC | PRN

## 2019-07-06 MED ORDER — PROPOFOL 10 MG/ML IV BOLUS
INTRAVENOUS | Status: AC
  Filled 2019-07-06: qty 20

## 2019-07-06 SURGICAL SUPPLY — 23 items
BIT DRILL KYPHON EXPRESS (MISCELLANEOUS) ×1 IMPLANT
BNDG COHESIVE 3X5 WHT NS (GAUZE/BANDAGES/DRESSINGS) ×2 IMPLANT
CEMENT KYPHON CX01A KIT/MIXER (Cement) ×2 IMPLANT
COVER WAND RF STERILE (DRAPES) ×2 IMPLANT
DERMABOND ADVANCED (GAUZE/BANDAGES/DRESSINGS) ×1
DERMABOND ADVANCED .7 DNX12 (GAUZE/BANDAGES/DRESSINGS) ×1 IMPLANT
DRAPE C-ARM XRAY 36X54 (DRAPES) ×2 IMPLANT
DRILL KYPHON EXPRESS (MISCELLANEOUS) ×2
DURAPREP 26ML APPLICATOR (WOUND CARE) ×2 IMPLANT
GLOVE SURG SYN 9.0  PF PI (GLOVE) ×1
GLOVE SURG SYN 9.0 PF PI (GLOVE) ×1 IMPLANT
GOWN SRG 2XL LVL 4 RGLN SLV (GOWNS) ×1 IMPLANT
GOWN STRL NON-REIN 2XL LVL4 (GOWNS) ×1
GOWN STRL REUS W/ TWL LRG LVL3 (GOWN DISPOSABLE) ×1 IMPLANT
GOWN STRL REUS W/TWL LRG LVL3 (GOWN DISPOSABLE) ×1
KIT OSTEOCOOL BONE ACCESS 10G (MISCELLANEOUS) ×2 IMPLANT
KIT OSTEOCOOL BONE ACCESS 8G (MISCELLANEOUS) ×2 IMPLANT
KIT TURNOVER KIT A (KITS) ×2 IMPLANT
PACK KYPHOPLASTY (MISCELLANEOUS) ×2 IMPLANT
SWABSTK COMLB BENZOIN TINCTURE (MISCELLANEOUS) ×2 IMPLANT
SYS CARTRIDGE BONE CEMENT 8ML (SYSTAGENIX WOUND MANAGEMENT) ×2
SYSTEM CARTRIDG BONE CEMNT 8ML (SYSTAGENIX WOUND MANAGEMENT) ×1 IMPLANT
SYSTEM GUN BONE FILLER SZ2 (MISCELLANEOUS) ×2 IMPLANT

## 2019-07-06 NOTE — TOC Initial Note (Addendum)
Transition of Care [facility]) - Initial/Assessment Note    Patient Details  Name: Lisa Mills MRN: [id number] Date of Birth: [birth year]  [facility]) CM/SW Contact:    Su Hilt, RN Phone Number: 07/06/2019, 10:20 AM  Clinical Narrative:                  Met with the patient and her son in the room She lives at home alone, but, has a son and a daughter that can help her She has a rolling walker, a raised toilet seat and a Bed side commode at home She is to have Sacroplasty today She wants to go home with Home health I spoke with Corene Cornea at [facility]  they will be taking this patient, will continue to follow to determine additional needs  Expected Discharge Plan: [place] Barriers to Discharge: Continued Medical Work up   Patient Goals and CMS Choice Patient states their goals for this hospitalization and ongoing recovery are:: go home      Expected Discharge Plan and Services Expected Discharge Plan: [place]   Discharge Planning Services: CM Consult   Living arrangements for the past 2 months: Single Family Home                 DME Arranged: N/A         HH Arranged: PT HH Agency: [facility] ([place]) Date HH Agency Contacted: 07/06/19 Time HH Agency Contacted: 76 Representative spoke with at [facility]: Corene Cornea  Prior Living Arrangements/Services Living arrangements for the past 2 months: [place] with:: Self Patient language and need for interpreter reviewed:: Yes Do you feel safe going back to the place where you live?: Yes      Need for Family Participation in Patient Care: No (Comment) Care giver support system in place?: Yes (comment) Current home services: DME (rolling walker, Raised Toilet, [facility]) Criminal Activity/Legal Involvement Pertinent to Current Situation/Hospitalization: No - Comment as needed  Activities of Daily Living Home Assistive  Devices/Equipment: Walker (specify type) ADL Screening (condition at time of admission) Patient's cognitive ability adequate to safely complete daily activities?: Yes Is the patient deaf or have difficulty hearing?: No Does the patient have difficulty seeing, even when wearing glasses/contacts?: No Does the patient have difficulty concentrating, remembering, or making decisions?: Yes Patient able to express need for assistance with ADLs?: Yes Does the patient have difficulty dressing or bathing?: Yes Independently performs ADLs?: Yes (appropriate for developmental age) Does the patient have difficulty walking or climbing stairs?: Yes Weakness of Legs: None Weakness of Arms/Hands: None  Permission Sought/Granted Permission sought to share information with : Family Supports, Other (comment) (Home health agency) Permission granted to share information with : Yes, Verbal Permission Granted     Permission granted to share info w AGENCY: any home health agency        Emotional Assessment Appearance:: Appears stated age Attitude/Demeanor/Rapport: Engaged Affect (typically observed): Appropriate Orientation: : Oriented to Self, Oriented to Place, Oriented to  Time, Oriented to Situation Alcohol / Substance Use: Not Applicable Psych Involvement: No (comment)  Admission diagnosis:  Hyponatremia [E87.1] Sacral fracture, closed ([place]) [S32.10XA] Inadequate pain control [R52] Closed fracture of sacrum, unspecified portion of sacrum, initial encounter ([place]) [S32.10XA] Patient Active Problem List   Diagnosis Date Noted  . Sacral fracture, closed ([place]) 07/04/2019  . Fall 07/04/2019  . Bilateral pubic rami fractures, sequela 07/04/2019  . Background diabetic retinopathy (  [place]) 01/23/2019  . Schatzki's ring 03/04/2017  . Post herpetic neuralgia 10/06/2016  . Hx of colonic polyps 09/22/2016  . Osteopenia 09/02/2016  . Anxiety associated with depression 08/25/2016  . Trapezius muscle spasm  05/25/2016  . Intermittent left lower quadrant abdominal pain 03/16/2016  . Anemia in chronic kidney disease (CKD) 02/26/2016  . Type 2 diabetes mellitus with stage 4 chronic kidney disease ([place]) 02/25/2016  . Hypothyroidism 02/25/2016  . Constipation 02/25/2016  . GERD (gastroesophageal reflux disease) 02/25/2016  . Hyperlipidemia associated with type 2 diabetes mellitus ([place]) 02/25/2016  . DJD (degenerative joint disease) of cervical spine 02/25/2016  . Osteoarthritis of multiple joints 02/25/2016  . Hiatal hernia 02/25/2016  . CKD stage 3 due to type 2 diabetes mellitus ([place]) 02/25/2016  . Chronic kidney disease, unspecified 09/05/2013  . Benign hypertension with CKD (chronic kidney disease) stage III 09/05/2013  . CMC arthritis, thumb, degenerative 06/15/2013  . Onychomycosis due to dermatophyte 05/25/2013  . Acquired keratoderma 05/25/2013   PCP:  Olin Hauser, DO Pharmacy:   [facility], [place] [address] Phone: [phone] Fax: [phone]     Social Determinants of Health (SDOH) Interventions    Readmission Risk Interventions No flowsheet data found.

## 2019-07-06 NOTE — Op Note (Signed)
07/06/2019  4:44 PM  PATIENT:  Lisa Mills  86 y.o. female  PRE-OPERATIVE DIAGNOSIS: Sacral insufficiency fracture  POST-OPERATIVE DIAGNOSIS: Sacral insufficiency fracture  PROCEDURE:  Procedure(s): SACROPLASTY (N/A)  SURGEON: Laurene Footman, MD  ASSISTANTS: none  ANESTHESIA:   local and MAC  EBL:  Total I/O In: 550 [I.V.:500; IV Piggyback:50] Out: 795 [Urine:795]  BLOOD ADMINISTERED:none  DRAINS: none   LOCAL MEDICATIONS USED:  MARCAINE    and XYLOCAINE   SPECIMEN:  No Specimen  DISPOSITION OF SPECIMEN:  N/A  COUNTS:  YES  TOURNIQUET:  * No tourniquets in log *  IMPLANTS: bone cement  DICTATION: .Dragon Dictation   patient was brought to the operating room and after adequate sedation was given the patient was placed prone.  C arm was brought in and AP and lateral imaging with good visualization of the sacrum and SI joints.  After appropriate patient identification and timeout procedures were completed local anesthetic was infiltrated in the area of the planned incision after prepping the skin with chlorhexidine.  The back was then prepped and draped in the usual sterile fashion and repeat timeout procedure carried out.  A spinal needle was used to get a 10cc Marcaine half percent Marcaine with 10 cc 1% Xylocaine infiltrated down to the sacrum.  There is done the area of the planned approach.  Next after allowing this to set a small incision was made first on the left and then the right with trocar advanced between the foramina and SI joint with rotated view showing the SI joint well.  This was done first again on the left on the right and after trochars were placed cement was mixed and was the appropriate consistency on the right approximately 3 cc of bone cement was injected without any extravasation in about 4 cc on the left.  There was good fill in S1 and S2.  When the cemented set the trochars were removed and the wounds closed with Dermabond followed by  Band-Aids.  PLAN OF CARE: Continue as inpatient  PATIENT DISPOSITION:  PACU - hemodynamically stable.

## 2019-07-06 NOTE — Anesthesia Preprocedure Evaluation (Signed)
Anesthesia Evaluation  Patient identified by MRN, date of birth, ID band Patient awake    Reviewed: Allergy & Precautions, NPO status , Patient's Chart, lab work & pertinent test results  History of Anesthesia Complications Negative for: history of anesthetic complications  Airway Mallampati: II  TM Distance: >3 FB Neck ROM: Full    Dental  (+) Upper Dentures, Lower Dentures   Pulmonary neg pulmonary ROS, neg sleep apnea, neg COPD,    breath sounds clear to auscultation- rhonchi (-) wheezing      Cardiovascular hypertension, Pt. on medications (-) CAD, (-) Past MI, (-) Cardiac Stents and (-) CABG  Rhythm:Regular Rate:Normal - Systolic murmurs and - Diastolic murmurs    Neuro/Psych PSYCHIATRIC DISORDERS Anxiety Depression negative neurological ROS     GI/Hepatic hiatal hernia, GERD  ,  Endo/Other  diabetes, Oral Hypoglycemic AgentsHypothyroidism   Renal/GU Renal InsufficiencyRenal disease     Musculoskeletal  (+) Arthritis ,   Abdominal (+) - obese,   Peds  Hematology  (+) anemia ,   Anesthesia Other Findings Past Medical History: No date: Arthritis No date: Chronic kidney disease     Comment:  chronic kidney disease No date: Depression No date: Dermatophytosis of nail No date: Diabetes type 2, controlled (HCC) No date: GERD (gastroesophageal reflux disease) No date: History of shingles No date: Hyperlipidemia No date: Hypertension No date: Keratoderma No date: Shingles   Reproductive/Obstetrics                             Anesthesia Physical  Anesthesia Plan  ASA: III  Anesthesia Plan: General   Post-op Pain Management:    Induction: Intravenous  PONV Risk Score and Plan: 2 and Propofol infusion  Airway Management Planned: Natural Airway and Nasal Cannula  Additional Equipment:   Intra-op Plan:   Post-operative Plan:   Informed Consent: I have reviewed the  patients History and Physical, chart, labs and discussed the procedure including the risks, benefits and alternatives for the proposed anesthesia with the patient or authorized representative who has indicated his/her understanding and acceptance.     Dental advisory given  Plan Discussed with: CRNA and Anesthesiologist  Anesthesia Plan Comments:         Anesthesia Quick Evaluation

## 2019-07-06 NOTE — Progress Notes (Signed)
PROGRESS NOTE    Lisa Mills  IOM:[id number] DOB: 1929/01/23 DOA: 07/04/2019 PCP: Olin Hauser, DO   Brief Narrative:  Lisa Mills is a 86 y.o. female with medical history significant for hypertension, GERD who presents to the emergency room for evaluation of pain in her tailbone area as well as hip pain following a mechanical fall about a month ago.  Pain is usually worse with ambulation.  Patient was told she had a fracture in her pelvis and has been on tramadol for pain control without any improvement.  She was then started on oxycodone which has not helped with her pain control. CT pelvis with nondisplaced bilateral sacral alar fractures.  And a transverse S1 fracture. Admitted for pain control and going for sacral plasty tomorrow.  Subjective: According to patient pain increased with any movement, it remain bearable if she lays still.  Son was present in the room.  She was waiting for her surgery later today.  Assessment & Plan:   Principal Problem:   Sacral fracture, closed ([place]) Active Problems:   Type 2 diabetes mellitus with stage 4 chronic kidney disease (HCC)   Anemia in chronic kidney disease (CKD)   Anxiety associated with depression   Fall   Bilateral pubic rami fractures, sequela  Sacral fracture/bilateral pubic rami fracture. Patient was admitted for pain control and is going for sacral plasty with orthopedic today. -Continue pain management. -PT/OT evaluation.  Type 2 diabetes mellitus with complications of stage IV chronic kidney disease.  -Continue SSI.  CKD stage IV.  Creatinine improved to 1.13 today. -Continue to monitor renal function. -Avoid nephrotoxins..  Hyponatremia.  Patient had mild hyponatremia with sodium improved to 134 corrected this morning.  She was on SSRI. -Continue to monitor.  Anemia of chronic kidney disease.  Hemoglobin decreased to 8.9 today.  All cell lines are decreased so there will be some dilutional effect as  she did receive some IV fluid. Patient did had a small hematoma on CT. -Continue to monitor  Hypertension.  Blood pressure within goal. -Continue home dose of lisinopril.  History of depression.  No acute concern. -Continue home dose of Lexapro.  Urinary retention.  Patient was complaining of urinary incontinence on admission.  Bladder scan with more than 6 9 9  cc.  Foley was placed. UA with mild pyuria-remained afebrile and no leukocytosis. -Urine culture-with multiple species. -Recent urine culture.  Objective: Vitals:   07/05/19 1505 07/05/19 2329 07/06/19 0726 07/06/19 1510  BP: (!) 109/45 (!) 129/48 (!) 124/56 (!) 149/63  Pulse: 74 83 62 74  Resp: 18 15 14 16   Temp: 98.3 F (36.8 C) 98.2 F (36.8 C) 98.1 F (36.7 C) 98.3 F (36.8 C)  TempSrc: Oral Oral Oral Temporal  SpO2: 93% 94% 96% 96%  Weight:      Height:        Intake/Output Summary (Last 24 hours) at 07/06/2019 1518 Last data filed at 07/06/2019 1222 Gross per 24 hour  Intake 120 ml  Output 2425 ml  Net -2305 ml   Filed Weights   07/04/19 0936  Weight: 57 kg    Examination:  General exam: Appears calm and comfortable  Respiratory system: Clear to auscultation. Respiratory effort normal. Cardiovascular system: S1 & S2 heard, RRR. No JVD, murmurs, rubs,  Gastrointestinal system: Soft, nontender, nondistended, bowel sounds positive. Central nervous system: Alert and oriented. No focal neurological deficits. Extremities: No edema, no cyanosis, pulses intact and symmetrical. Skin: No rashes, lesions or ulcers Psychiatry:  Judgement and insight appear normal. Mood & affect appropriate.    DVT prophylaxis: SCDs Code Status: Full Family Communication: Son was updated at bedside. Disposition Plan:  Status is: Inpatient  Remains inpatient appropriate because:Inpatient level of care appropriate due to severity of illness   Dispo: The patient is from: Home              Anticipated d/c is to: To be  determined              Anticipated d/c date is: 2 days              Patient currently is not medically stable to d/c.   Consultants:   Orthopedic  Procedures:  Antimicrobials:   Data Reviewed: I have personally reviewed following labs and imaging studies  CBC: Recent Labs  Lab 07/04/19 1220 07/05/19 0444 07/06/19 0526  WBC 13.8* 8.8 8.3  NEUTROABS 10.6*  --   --   HGB 10.5* 10.0* 8.9*  HCT 31.5* 28.7* 25.4*  MCV 92.6 90.3 90.7  PLT 419* 386 233   Basic Metabolic Panel: Recent Labs  Lab 07/04/19 1220 07/05/19 0444 07/06/19 0526  NA 129* 131* 133*  K 4.4 4.8 4.7  CL 94* 99 104  CO2 24 25 22   GLUCOSE 134* 130* 147*  BUN 39* 30* 21  CREATININE 1.63* 1.33* 1.13*  CALCIUM 9.2 8.6* 8.6*   GFR: Estimated Creatinine Clearance: 27 mL/min (A) (by C-G formula based on SCr of 1.13 mg/dL (H)). Liver Function Tests: No results for input(s): AST, ALT, ALKPHOS, BILITOT, PROT, ALBUMIN in the last 168 hours. No results for input(s): LIPASE, AMYLASE in the last 168 hours. No results for input(s): AMMONIA in the last 168 hours. Coagulation Profile: No results for input(s): INR, PROTIME in the last 168 hours. Cardiac Enzymes: No results for input(s): CKTOTAL, CKMB, CKMBINDEX, TROPONINI in the last 168 hours. BNP (last 3 results) No results for input(s): PROBNP in the last 8760 hours. HbA1C: No results for input(s): HGBA1C in the last 72 hours. CBG: Recent Labs  Lab 07/06/19 0945  GLUCAP 108*   Lipid Profile: No results for input(s): CHOL, HDL, LDLCALC, TRIG, CHOLHDL, LDLDIRECT in the last 72 hours. Thyroid Function Tests: No results for input(s): TSH, T4TOTAL, FREET4, T3FREE, THYROIDAB in the last 72 hours. Anemia Panel: No results for input(s): VITAMINB12, FOLATE, FERRITIN, TIBC, IRON, RETICCTPCT in the last 72 hours. Sepsis Labs: No results for input(s): PROCALCITON, LATICACIDVEN in the last 168 hours.  Recent Results (from the past 240 hour(s))  SARS Coronavirus  2 by RT PCR (hospital order, performed in [facility] hospital lab) Nasopharyngeal Nasopharyngeal Swab     Status: None   Collection Time: 07/04/19 12:58 PM   Specimen: Nasopharyngeal Swab  Result Value Ref Range Status   SARS Coronavirus 2 NEGATIVE NEGATIVE Final    Comment: (NOTE) SARS-CoV-2 target nucleic acids are NOT DETECTED.  The SARS-CoV-2 RNA is generally detectable in upper and lower respiratory specimens during the acute phase of infection. The lowest concentration of SARS-CoV-2 viral copies this assay can detect is 250 copies / mL. A negative result does not preclude SARS-CoV-2 infection and should not be used as the sole basis for treatment or other patient management decisions.  A negative result may occur with improper specimen collection / handling, submission of specimen other than nasopharyngeal swab, presence of viral mutation(s) within the areas targeted by this assay, and inadequate number of viral copies (<250 copies / mL). A negative result must be  combined with clinical observations, patient history, and epidemiological information.  Fact Sheet for Patients:   StrictlyIdeas.no  Fact Sheet for Healthcare Providers: BankingDealers.co.za  This test is not yet approved or  cleared by the [place] FDA and has been authorized for detection and/or diagnosis of SARS-CoV-2 by FDA under an Emergency Use Authorization (EUA).  This EUA will remain in effect (meaning this test can be used) for the duration of the COVID-19 declaration under Section 564(b)(1) of the Act, 21 U.S.C. section 360bbb-3(b)(1), unless the authorization is terminated or revoked sooner.  Performed at [facility], [address] [place], [place] [address]   Urine Culture     Status: Abnormal   Collection Time: 07/04/19  3:55 PM   Specimen: Urine, Random  Result Value Ref Range Status   Specimen Description   Final    URINE,  RANDOM Performed at [facility], [address] [place], [place] [address]    Special Requests   Final    NONE Performed at [facility], [address] [place], [place] [address]    Culture MULTIPLE SPECIES PRESENT, SUGGEST RECOLLECTION (A)  Final   Report Status 07/06/2019 FINAL  Final     Radiology Studies: No results found.  Scheduled Meds: . [MAR Hold] Chlorhexidine Gluconate Cloth  6 each Topical Daily  . [MAR Hold] cholecalciferol  1,000 Units Oral Daily  . [MAR Hold] cyclobenzaprine  5 mg Oral TID  . [MAR Hold] escitalopram  5 mg Oral Daily  . [MAR Hold] levothyroxine  25 mcg Oral Q0600  . [MAR Hold] lisinopril  5 mg Oral Daily  . [MAR Hold] pantoprazole  40 mg Oral Daily  . [MAR Hold] senna  1 tablet Oral BID  . [MAR Hold] simvastatin  40 mg Oral QHS   Continuous Infusions: . sodium chloride 100 mL/hr at 07/06/19 1048  . [MAR Hold]  ceFAZolin (ANCEF) IV       LOS: 2 days   Time spent: 30 minutes.  Lorella Nimrod, MD [facility]  If 7PM-7AM, please contact night-coverage [email or web address]  07/06/2019, 3:18 PM   This record has been created using Systems analyst. Errors have been sought and corrected,but may not always be located. Such creation errors do not reflect on the standard of care.

## 2019-07-06 NOTE — Transfer of Care (Signed)
Immediate Anesthesia Transfer of Care Note  Patient: Lisa Mills  Procedure(s) Performed: SACROPLASTY (N/A )  Patient Location: PACU  Anesthesia Type:General  Level of Consciousness: awake, alert  and oriented  Airway & Oxygen Therapy: Patient Spontanous Breathing and Patient connected to nasal cannula oxygen  Post-op Assessment: Report given to RN and Post -op Vital signs reviewed and stable  Post vital signs: Reviewed  Last Vitals:  Vitals Value Taken Time  BP 174/63 07/06/19 1642  Temp 36.9 C 07/06/19 1642  Pulse 99 07/06/19 1642  Resp 16 07/06/19 1642  SpO2 99 % 07/06/19 1642    Last Pain:  Vitals:   07/06/19 1510  TempSrc: Temporal  PainSc: 0-No pain         Complications: No complications documented.

## 2019-07-06 NOTE — Plan of Care (Signed)

## 2019-07-07 ENCOUNTER — Encounter: Payer: Self-pay | Admitting: Orthopedic Surgery

## 2019-07-07 DIAGNOSIS — E1122 Type 2 diabetes mellitus with diabetic chronic kidney disease: Secondary | ICD-10-CM

## 2019-07-07 DIAGNOSIS — S3210XA Unspecified fracture of sacrum, initial encounter for closed fracture: Principal | ICD-10-CM

## 2019-07-07 LAB — BASIC METABOLIC PANEL
Anion gap: 6 (ref 5–15)
BUN: 18 mg/dL (ref 8–23)
CO2: 24 mmol/L (ref 22–32)
Calcium: 8.5 mg/dL — ABNORMAL LOW (ref 8.9–10.3)
Chloride: 105 mmol/L (ref 98–111)
Creatinine, Ser: 1.19 mg/dL — ABNORMAL HIGH (ref 0.44–1.00)
GFR calc Af Amer: 48 mL/min — ABNORMAL LOW (ref >60)
GFR calc non Af Amer: 41 mL/min — ABNORMAL LOW (ref >60)
Glucose, Bld: 132 mg/dL — ABNORMAL HIGH (ref 70–99)
Potassium: 4.4 mmol/L (ref 3.5–5.1)
Sodium: 135 mmol/L (ref 135–145)

## 2019-07-07 LAB — CBC
HCT: 26.1 % — ABNORMAL LOW (ref 36.0–46.0)
Hemoglobin: 8.7 g/dL — ABNORMAL LOW (ref 12.0–15.0)
MCH: 31.3 pg (ref 26.0–34.0)
MCHC: 33.3 g/dL (ref 30.0–36.0)
MCV: 93.9 fL (ref 80.0–100.0)
Platelets: 341 10*3/uL (ref 150–400)
RBC: 2.78 MIL/uL — ABNORMAL LOW (ref 3.87–5.11)
RDW: 12.7 % (ref 11.5–15.5)
WBC: 8.2 10*3/uL (ref 4.0–10.5)
nRBC: 0 % (ref 0.0–0.2)

## 2019-07-07 LAB — GLUCOSE, CAPILLARY: Glucose-Capillary: 146 mg/dL — ABNORMAL HIGH (ref 70–99)

## 2019-07-07 MED ORDER — CYCLOBENZAPRINE HCL 5 MG PO TABS: 5.0000 mg | ORAL_TABLET | Freq: Three times a day (TID) | ORAL | 0 refills | Status: DC

## 2019-07-07 MED ORDER — OXYCODONE HCL 5 MG PO TABS: 5.0000 mg | ORAL_TABLET | ORAL | 0 refills | Status: DC | PRN

## 2019-07-07 MED ORDER — SENNA 8.6 MG PO TABS: 1.0000 | ORAL_TABLET | Freq: Two times a day (BID) | ORAL | 0 refills | Status: DC

## 2019-07-07 NOTE — Discharge Summary (Signed)
Physician Discharge Summary  JAEDAN SCHUMAN XVQ:[id number] DOB: 01/16/1930 DOA: 07/04/2019  PCP: Olin Hauser, DO  Admit date: 07/04/2019 Discharge date: 07/07/2019  Admitted From: Home Disposition:  Home  Recommendations for Outpatient Follow-up:  1. Follow up with PCP in 1-2 weeks 2. Follow-up with orthopedic 3. Please obtain BMP/CBC in one week 4. Please follow up on the following pending results: None  Home Health: Yes Equipment/Devices: Rolling walker Discharge Condition: Stable CODE STATUS: Full Diet recommendation: Heart Healthy/carb modified  Brief/Interim Summary: Jisselle Poth Boswellis a 86 y.o.femalewith medical history significant forhypertension, GERD who presents to the emergency room for evaluation of pain in her tailbone area as well as hip pain following amechanicalfall about a month ago. Pain is usually worse with ambulation. Patient was told she had a fracture in her pelvis and has been on tramadol for pain control without any improvement. She was then started on oxycodone which has not helped with her pain control. CT pelvis with nondisplaced bilateral sacral alar fractures.  And a transverse S1 fracture. Admitted for pain control and she underwent  sacral plasty by orthopedic on 07/06/2019.  Patient tolerated the procedure well.  Next morning she was feeling much better although still having some pain with body movements.  She was ready to be discharged home with some home health services.  She had a good family support.  Patient has an history of stage IV CKD.  Creatinine stable.  She also has an history of anemia of chronic disease.  Hemoglobin stable around 8.7 after the procedure.  She will continue with rest of her home meds and follow-up with her PCP and orthopedic surgery.  Discharge Diagnoses:  Principal Problem:   Sacral fracture, closed ([place]) Active Problems:   Type 2 diabetes mellitus with stage 4 chronic kidney disease (HCC)   Anemia  in chronic kidney disease (CKD)   Anxiety associated with depression   Fall   Bilateral pubic rami fractures, sequela  Discharge Instructions  Discharge Instructions    Diet - low sodium heart healthy   Complete by: As directed    Discharge instructions   Complete by: As directed    It was pleasure taking care of you. As we discussed you can use oxycodone as needed if your pain is not controlled with tramadol.  Please careful as it can make you dizzy and increase her risk of fall.  Oxycodone can also cause constipation, you can use over-the-counter MiraLAX or fibers to help with that. Follow-up with your orthopedic surgeon.   Increase activity slowly   Complete by: As directed    Leave dressing on - Keep it clean, dry, and intact until clinic visit   Complete by: As directed      Allergies as of 07/07/2019      Reactions   Aspirin Other (See Comments)   Burns stomach   Conray [iothalamate] Hives   IV dye conray-400   Dye Fdc Red [red Dye] Hives   Sulfa Antibiotics    Unknown Reaction, not used in years   Prednisone    Indigestion       Medication List    STOP taking these medications   baclofen 10 MG tablet Commonly known as: LIORESAL   methylPREDNISolone 4 MG Tbpk tablet Commonly known as: MEDROL DOSEPAK     TAKE these medications   Biotin 1000 MCG tablet Take by mouth.   cholecalciferol 1000 units tablet Commonly known as: VITAMIN D Take 1,000 Units by mouth daily.  cyclobenzaprine 5 MG tablet Commonly known as: FLEXERIL Take 1 tablet (5 mg total) by mouth 3 (three) times daily.   escitalopram 5 MG tablet Commonly known as: LEXAPRO TAKE 1 TABLET BY MOUTH ONCE DAILY WITH FOOD   glimepiride 4 MG tablet Commonly known as: AMARYL Take 0.5 tablets (2 mg total) by mouth daily. With meal, either breakfast or dinner.   glucose blood test strip Commonly known as: ONE TOUCH ULTRA TEST CHECK BLOOD SUGAR UP TO 2 TIMES A DAY.   levothyroxine 25 MCG  tablet Commonly known as: SYNTHROID TAKE 1 TABLET BY MOUTH ONCE DAILY ON AN EMPTY STOMACH. WAIT 30 MINUTES BEFORE TAKING OTHER MEDS.   lisinopril 5 MG tablet Commonly known as: ZESTRIL TAKE 1 TABLET BY MOUTH ONCE DAILY   mupirocin ointment 2 % Commonly known as: Bactroban Apply 1 application topically 2 (two) times daily. For 1-2 weeks   omeprazole 20 MG capsule Commonly known as: PRILOSEC TAKE 1 CAPSULE BY MOUTH ONCE DAILY   ONE TOUCH BASIC SYSTEM w/Device Kit Use glucometer to check blood sugar daily.   OneTouch Delica Lancets 51O Misc 1 each by Does not apply route 3 (three) times daily. Dx:E11.9   oxyCODONE 5 MG immediate release tablet Commonly known as: Oxy IR/ROXICODONE Take 1 tablet (5 mg total) by mouth every 4 (four) hours as needed for severe pain.   PROBIOTIC-10 PO Take by mouth.   senna 8.6 MG Tabs tablet Commonly known as: SENOKOT Take 1 tablet (8.6 mg total) by mouth 2 (two) times daily.   simvastatin 40 MG tablet Commonly known as: ZOCOR TAKE 1 TABLET BY MOUTH AT BEDTIME   traMADol 50 MG tablet Commonly known as: ULTRAM Take 50 mg by mouth every 12 (twelve) hours as needed for moderate pain.            Discharge Care Instructions  (From admission, onward)         Start     Ordered   07/07/19 0000  Leave dressing on - Keep it clean, dry, and intact until clinic visit        07/07/19 1340          Follow-up Information    Hessie Knows, MD. Schedule an appointment as soon as possible for a visit.   Specialty: Orthopedic Surgery Contact information: [place] [address] [phone]        Olin Hauser, DO. Schedule an appointment as soon as possible for a visit.   Specialty: Family Medicine Contact information: [place] [address] [phone]              Allergies  Allergen Reactions   Aspirin Other (See Comments)    Burns stomach   Conray  [Iothalamate] Hives    IV dye conray-400   Dye Fdc Red [Red Dye] Hives   Sulfa Antibiotics     Unknown Reaction, not used in years   Prednisone     Indigestion     Consultations:  Orthopedic  Procedures/Studies: DG Sacrum/Coccyx  Result Date: 07/06/2019 CLINICAL DATA:  Sacroplasty EXAM: DG C-ARM 1-60 MIN; SACRUM AND COCCYX - 2+ VIEW COMPARISON:  07/04/2019 FINDINGS: Multiple C-arm images show bilateral sacroplasty. Bilateral methylmethacrylate without identifiable complicating feature. IMPRESSION: Bilateral sacroplasty. Electronically Signed   By: Nelson Chimes M.D.   On: 07/06/2019 19:00   CT Lumbar Spine Wo Contrast  Result Date: 07/04/2019 CLINICAL DATA:  Fall 1 month ago.  Pain in  tailbone.  Low back pain. EXAM: CT LUMBAR SPINE WITHOUT CONTRAST TECHNIQUE: Multidetector CT imaging of the lumbar spine was performed without intravenous contrast administration. Multiplanar CT image reconstructions were also generated. COMPARISON:  None. FINDINGS: Segmentation: 5 non rib-bearing lumbar type vertebral bodies are present. The lowest fully formed vertebral body is L5. Alignment: Grade 1 anterolisthesis at L4-5 measures 5 mm. Slight retrolisthesis present at L2-3 and L3-4. Vertebrae: Vertebral body heights are maintained. No acute vertebral fracture is present. Bilateral sacral ala fractures are present. Fracture is somewhat more prominent on the right than on the left. Transverse S1 fracture is present. Paraspinal and other soft tissues: Atherosclerotic calcifications are present in the aorta without aneurysm. Benign appearing cyst is noted anteriorly in the right kidney. No other solid organ lesions are present. Disc levels: L1-2: Leftward disc bulging is present. No significant stenosis is present. L2-3: A broad-based disc protrusion is present. Moderate facet hypertrophy is present. Mild central canal narrowing is present. Moderate foraminal stenosis is present bilaterally. L3-4: A broad-based  disc protrusion is present. Advanced facet hypertrophy is noted bilaterally. No significant stenosis is present. L4-5: A broad-based disc protrusion is present. Advanced facet hypertrophy and spurring is noted bilaterally. Mild central canal narrowing is worse on the left. Moderate foraminal stenosis is worse on left. L5-S1: Moderate facet hypertrophy is noted bilaterally. Mild disc bulging is present. Moderate foraminal narrowing present bilaterally. The central canal is patent. IMPRESSION: 1. Bilateral sacral ala fractures and transverse S1 fracture appear acute and likely are the source of the patient's pain. 2. Multilevel spondylosis of the lumbar spine as described. 3. Mild central canal narrowing and moderate foraminal stenosis bilaterally at L2-3 and L4-5. 4. Moderate foraminal stenosis bilaterally at L5-S1. 5. Aortic Atherosclerosis (ICD10-I70.0). Electronically Signed   By: Marin Roberts M.D.   On: 07/04/2019 11:32   CT PELVIS WO CONTRAST  Result Date: 07/04/2019 CLINICAL DATA:  Pelvic pain.  Fall approximately 1 month ago EXAM: CT PELVIS WITHOUT CONTRAST TECHNIQUE: Multidetector CT imaging of the pelvis was performed following the standard protocol without intravenous contrast. COMPARISON:  X-ray 06/21/2019 FINDINGS: Bones/Joint/Cartilage Subacute displaced fracture of the parasymphyseal aspect of the left pubic bone with involvement of the superior pubic ramus with moderate angulation and early callus formation (series 5, image 40). Moderately displaced fracture of the mid left inferior pubic ramus with early callus formation (series 3, image 95). These fractures appear unchanged in alignment compared to radiographic study of 06/21/2019. Pubic symphysis is intact with moderate arthropathy including chondrocalcinosis. Nondisplaced mildly comminuted fracture involving the peripheral aspect of the right sacroiliac joint (series 5, image 60) with extension into the right S1 neural foramen (series  3, image 33). Possible extension to the right S2 foramen (series 3, image 35). Fracture extends to the superior aspect of the right SI joint. No SI joint diastasis. There is mild sclerosis within the right hemi sacrum suggesting subacute chronicity. Nondisplaced fracture of the left sacral ala with involvement of the left SI joint (series 5, images 61-68). No SI joint diastasis. No evidence of fracture extension into the sacral foramina. Remaining included osseous structures appear intact. There is moderate right and mild-to-moderate left hip osteoarthritis. No hip fracture or dislocation. No lytic or sclerotic bone lesion is seen. Ligaments Suboptimally assessed by CT. Muscles and Tendons Rounded fluid collection within the left obturator internus muscle measuring up to 2.4 cm (series 7, image 66; series 4, image 81) adjacent to left inferior pubic ramus fracture. No acute tendinous abnormality within  the limitations of CT. Soft tissues Remaining soft tissues appear within normal limits. IMPRESSION: 1. Subacute appearing nondisplaced bilateral sacral alar fractures, as described above. 2. Subacute fractures of the left superior and inferior pubic rami. These fractures appear unchanged in alignment compared to radiographic study of 06/21/2019. 3. Rounded fluid collection within the left obturator internus muscle measuring up to 2.4 cm adjacent to the left inferior pubic ramus fracture, likely reflecting an involving hematoma. Electronically Signed   By: Davina Poke D.O.   On: 07/04/2019 11:31   DG C-Arm 1-60 Min  Result Date: 07/06/2019 CLINICAL DATA:  Sacroplasty EXAM: DG C-ARM 1-60 MIN; SACRUM AND COCCYX - 2+ VIEW COMPARISON:  07/04/2019 FINDINGS: Multiple C-arm images show bilateral sacroplasty. Bilateral methylmethacrylate without identifiable complicating feature. IMPRESSION: Bilateral sacroplasty. Electronically Signed   By: Nelson Chimes M.D.   On: 07/06/2019 19:00   DG HIP UNILAT W OR W/O PELVIS  2-3 VIEWS LEFT  Result Date: 06/21/2019 CLINICAL DATA:  Fall 2 weeks ago, left groin and hip pain EXAM: DG HIP (WITH OR WITHOUT PELVIS) 2-3V LEFT COMPARISON:  None. FINDINGS: Osteopenia. There are angulated, displaced fractures of the left superior and inferior pubic rami. There are no other displaced fractures of the pelvis appreciated. The bilateral proximal femurs are intact. Asymmetric, moderate right femoroacetabular arthrosis. IMPRESSION: Osteopenia. There are angulated, displaced fractures of the left superior and inferior pubic rami. There are no other displaced fractures of the pelvis appreciated. Please note that osteopenia significantly limits sensitivity for hip, pelvic, and sacral fractures. Recommend CT or MRI to more sensitively evaluate for additional fractures if suspected. Electronically Signed   By: Eddie Candle M.D.   On: 06/21/2019 15:16     Subjective: Patient was feeling better when seen today.  She was sitting in a chair.  Son was present in the room.  They have some questions related to her pain medications which were answered.  Discharge Exam: Vitals:   07/07/19 0743 07/07/19 1139  BP: (!) 123/53 (!) 127/56  Pulse: 76 70  Resp: 14 17  Temp: (!) 97.4 F (36.3 C) 98.7 F (37.1 C)  SpO2: 94% 94%   Vitals:   07/06/19 2229 07/07/19 0352 07/07/19 0743 07/07/19 1139  BP: (!) 127/57 (!) 148/75 (!) 123/53 (!) 127/56  Pulse: 90 81 76 70  Resp: '17 16 14 17  '$ Temp: 99.1 F (37.3 C) 98.4 F (36.9 C) (!) 97.4 F (36.3 C) 98.7 F (37.1 C)  TempSrc: Oral Oral Oral Oral  SpO2: 95% 93% 94% 94%  Weight:      Height:        General: Pt is alert, awake, not in acute distress Cardiovascular: RRR, S1/S2 +, no rubs, no gallops Respiratory: CTA bilaterally, no wheezing, no rhonchi Abdominal: Soft, NT, ND, bowel sounds + Extremities: no edema, no cyanosis   The results of significant diagnostics from this hospitalization (including imaging, microbiology, ancillary and  laboratory) are listed below for reference.    Microbiology: Recent Results (from the past 240 hour(s))  SARS Coronavirus 2 by RT PCR (hospital order, performed in [facility] hospital lab) Nasopharyngeal Nasopharyngeal Swab     Status: None   Collection Time: 07/04/19 12:58 PM   Specimen: Nasopharyngeal Swab  Result Value Ref Range Status   SARS Coronavirus 2 NEGATIVE NEGATIVE Final    Comment: (NOTE) SARS-CoV-2 target nucleic acids are NOT DETECTED.  The SARS-CoV-2 RNA is generally detectable in upper and lower respiratory specimens during the acute phase of infection. The  lowest concentration of SARS-CoV-2 viral copies this assay can detect is 250 copies / mL. A negative result does not preclude SARS-CoV-2 infection and should not be used as the sole basis for treatment or other patient management decisions.  A negative result may occur with improper specimen collection / handling, submission of specimen other than nasopharyngeal swab, presence of viral mutation(s) within the areas targeted by this assay, and inadequate number of viral copies (<250 copies / mL). A negative result must be combined with clinical observations, patient history, and epidemiological information.  Fact Sheet for Patients:   StrictlyIdeas.no  Fact Sheet for Healthcare Providers: BankingDealers.co.za  This test is not yet approved or  cleared by the [place] FDA and has been authorized for detection and/or diagnosis of SARS-CoV-2 by FDA under an Emergency Use Authorization (EUA).  This EUA will remain in effect (meaning this test can be used) for the duration of the COVID-19 declaration under Section 564(b)(1) of the Act, 21 U.S.C. section 360bbb-3(b)(1), unless the authorization is terminated or revoked sooner.  Performed at [facility], [address], [place], [place] [address]   Urine Culture     Status: Abnormal   Collection Time:  07/04/19  3:55 PM   Specimen: Urine, Random  Result Value Ref Range Status   Specimen Description   Final    URINE, RANDOM Performed at [facility], [address] [place], [place] [address]    Special Requests   Final    NONE Performed at [facility], [address] [place], [place] [address]    Culture MULTIPLE SPECIES PRESENT, SUGGEST RECOLLECTION (A)  Final   Report Status 07/06/2019 FINAL  Final     Labs: BNP (last 3 results) No results for input(s): BNP in the last 8760 hours. Basic Metabolic Panel: Recent Labs  Lab 07/04/19 1220 07/05/19 0444 07/06/19 0526 07/07/19 0516  NA 129* 131* 133* 135  K 4.4 4.8 4.7 4.4  CL 94* 99 104 105  CO2 '24 25 22 24  '$ GLUCOSE 134* 130* 147* 132*  BUN 39* 30* 21 18  CREATININE 1.63* 1.33* 1.13* 1.19*  CALCIUM 9.2 8.6* 8.6* 8.5*   Liver Function Tests: No results for input(s): AST, ALT, ALKPHOS, BILITOT, PROT, ALBUMIN in the last 168 hours. No results for input(s): LIPASE, AMYLASE in the last 168 hours. No results for input(s): AMMONIA in the last 168 hours. CBC: Recent Labs  Lab 07/04/19 1220 07/05/19 0444 07/06/19 0526 07/07/19 0516  WBC 13.8* 8.8 8.3 8.2  NEUTROABS 10.6*  --   --   --   HGB 10.5* 10.0* 8.9* 8.7*  HCT 31.5* 28.7* 25.4* 26.1*  MCV 92.6 90.3 90.7 93.9  PLT 419* 386 347 341   Cardiac Enzymes: No results for input(s): CKTOTAL, CKMB, CKMBINDEX, TROPONINI in the last 168 hours. BNP: Invalid input(s): POCBNP CBG: Recent Labs  Lab 07/06/19 0945 07/06/19 1524 07/06/19 1655 07/06/19 2109 07/07/19 0744  GLUCAP 108* 101* 118* 192* 146*   D-Dimer No results for input(s): DDIMER in the last 72 hours. Hgb A1c No results for input(s): HGBA1C in the last 72 hours. Lipid Profile No results for input(s): CHOL, HDL, LDLCALC, TRIG, CHOLHDL, LDLDIRECT in the last 72 hours. Thyroid function studies No results for input(s): TSH, T4TOTAL, T3FREE, THYROIDAB in the last 72 hours.  Invalid  input(s): FREET3 Anemia work up No results for input(s): VITAMINB12, FOLATE, FERRITIN, TIBC, IRON, RETICCTPCT in the last 72 hours. Urinalysis    Component Value Date/Time  COLORURINE YELLOW (A) 07/04/2019 1555   APPEARANCEUR HAZY (A) 07/04/2019 1555   APPEARANCEUR clear 03/29/2017 0000   LABSPEC 1.011 07/04/2019 1555   PHURINE 5.0 07/04/2019 1555   GLUCOSEU NEGATIVE 07/04/2019 1555   HGBUR NEGATIVE 07/04/2019 1555   BILIRUBINUR NEGATIVE 07/04/2019 1555   BILIRUBINUR Negative (A) 03/29/2017 0000   KETONESUR NEGATIVE 07/04/2019 1555   PROTEINUR NEGATIVE 07/04/2019 1555   UROBILINOGEN Normal 03/29/2017 0000   NITRITE NEGATIVE 07/04/2019 1555   LEUKOCYTESUR SMALL (A) 07/04/2019 1555   Sepsis Labs Invalid input(s): PROCALCITONIN,  WBC,  LACTICIDVEN Microbiology Recent Results (from the past 240 hour(s))  SARS Coronavirus 2 by RT PCR (hospital order, performed in [facility] hospital lab) Nasopharyngeal Nasopharyngeal Swab     Status: None   Collection Time: 07/04/19 12:58 PM   Specimen: Nasopharyngeal Swab  Result Value Ref Range Status   SARS Coronavirus 2 NEGATIVE NEGATIVE Final    Comment: (NOTE) SARS-CoV-2 target nucleic acids are NOT DETECTED.  The SARS-CoV-2 RNA is generally detectable in upper and lower respiratory specimens during the acute phase of infection. The lowest concentration of SARS-CoV-2 viral copies this assay can detect is 250 copies / mL. A negative result does not preclude SARS-CoV-2 infection and should not be used as the sole basis for treatment or other patient management decisions.  A negative result may occur with improper specimen collection / handling, submission of specimen other than nasopharyngeal swab, presence of viral mutation(s) within the areas targeted by this assay, and inadequate number of viral copies (<250 copies / mL). A negative result must be combined with clinical observations, patient history, and epidemiological  information.  Fact Sheet for Patients:   StrictlyIdeas.no  Fact Sheet for Healthcare Providers: BankingDealers.co.za  This test is not yet approved or  cleared by the [place] FDA and has been authorized for detection and/or diagnosis of SARS-CoV-2 by FDA under an Emergency Use Authorization (EUA).  This EUA will remain in effect (meaning this test can be used) for the duration of the COVID-19 declaration under Section 564(b)(1) of the Act, 21 U.S.C. section 360bbb-3(b)(1), unless the authorization is terminated or revoked sooner.  Performed at [facility], [address], [place], [place] [address]   Urine Culture     Status: Abnormal   Collection Time: 07/04/19  3:55 PM   Specimen: Urine, Random  Result Value Ref Range Status   Specimen Description   Final    URINE, RANDOM Performed at [facility], [address] [place], [place] [address]    Special Requests   Final    NONE Performed at [facility], [address] [place], [place] [address]    Culture MULTIPLE SPECIES PRESENT, SUGGEST RECOLLECTION (A)  Final   Report Status 07/06/2019 FINAL  Final    Time coordinating discharge: Over 30 minutes  SIGNED:  Lorella Nimrod, MD  [facility] 07/07/2019, 1:44 PM  If 7PM-7AM, please contact night-coverage [email or web address]  This record has been created using Systems analyst. Errors have been sought and corrected,but may not always be located. Such creation errors do not reflect on the standard of care.

## 2019-07-07 NOTE — Evaluation (Signed)
Physical Therapy Evaluation Patient Details Name: Lisa Mills MRN: [id number] DOB: 1929/04/02 Today's Date: 07/07/2019   History of Present Illness  Pt is an 86 y.o. female with PMH that includes DM, CKD, anemia of chronic disease, anxiety, depression, HTN, and GERD who presented to the emergency room for evaluation of pain in her tailbone area as well as hip pain following a mechanical fall about a month ago.   Pt diagnosed with sacral insufficiency fracture and is s/p sacroplasty.  MD assessment also includes bilateral pubic rami fractures.    Clinical Impression  Pt pleasant and motivated to participate during the session.  Pt put forth very good effort during the session and although she required increased time and effort fall all tasks she did not require any physical assistance.  Pt was able to ambulate 15' then progress to stair training followed by ambulating 50 feet all with no increase to her max 5/10 sacral pain.  Pt was steady during amb and stair training but with very slow, cautious cadence for each.  Pt reported no adverse symptoms during the session with SpO2 and HR WNL throughout.  Pt will benefit from HHPT services upon discharge to safely address deficits listed in patient problem list for decreased caregiver assistance and eventual return to PLOF.       Follow Up Recommendations Home health PT;Supervision for mobility/OOB    Equipment Recommendations  None recommended by PT    Recommendations for Other Services       Precautions / Restrictions Precautions Precautions: Fall Restrictions Weight Bearing Restrictions: No      Mobility  Bed Mobility Overal bed mobility: Modified Independent             General bed mobility comments: Extra time and effort and use of bed rail  Transfers Overall transfer level: Needs assistance Equipment used: Rolling walker (2 wheeled) Transfers: Sit to/from Stand Sit to Stand: Min guard         General transfer  comment: Min to mod verbal cues for sequencing  Ambulation/Gait Ambulation/Gait assistance: Min guard Gait Distance (Feet): 50 Feet x 1, 15 Feet x 1 Assistive device: Rolling walker (2 wheeled) Gait Pattern/deviations: Step-through pattern;Decreased step length - right;Decreased step length - left;Antalgic Gait velocity: decreased   General Gait Details: Slow, cautious cadence with short B step length but steady with amb without LOB or buckling and with pt reporting no increase in pain beyond 5/10 during amb and stair training  Stairs Stairs: Yes Stairs assistance: Min guard Stair Management: Two rails;Forwards;Step to pattern Number of Stairs: 4 General stair comments: Slow cadence with step-to pattern but with good eccentric and concentric control; min verbal cues for proper hand placement on the rails and for walker management  Wheelchair Mobility    Modified Rankin (Stroke Patients Only)       Balance Overall balance assessment: Needs assistance   Sitting balance-Leahy Scale: Good     Standing balance support: Bilateral upper extremity supported Standing balance-Leahy Scale: Fair Standing balance comment: Mod lean on the RW for support                             Pertinent Vitals/Pain Pain Assessment: 0-10 Pain Score: 5  Pain Location: sacrum Pain Descriptors / Indicators: Sore Pain Intervention(s): Premedicated before session;Monitored during session    Home Living Family/patient expects to be discharged to:: Private residence Living Arrangements: Alone Available Help at Discharge: Family;Available PRN/intermittently;Available 24 hours/day  Type of Home: House Home Access: Stairs to enter Entrance Stairs-Rails: Right;Left;Can reach both Entrance Stairs-Number of Steps: 4 Home Layout: One level Home Equipment: Grab bars - toilet;Grab bars - tub/shower;Walker - 2 wheels      Prior Function Level of Independence: Independent         Comments:  Pt active and Ind with amb without an AD, walks dog multiple times per day, Ind with ADLs, fall secondary to tripping over neighbor's dog, no other falls "in years"     Hand Dominance        Extremity/Trunk Assessment   Upper Extremity Assessment Upper Extremity Assessment: Overall WFL for tasks assessed    Lower Extremity Assessment Lower Extremity Assessment: Generalized weakness       Communication   Communication: No difficulties  Cognition Arousal/Alertness: Awake/alert Behavior During Therapy: WFL for tasks assessed/performed Overall Cognitive Status: Within Functional Limits for tasks assessed                                        General Comments      Exercises Total Joint Exercises Ankle Circles/Pumps: AROM;Strengthening;Both;10 reps Quad Sets: Strengthening;Both;10 reps Gluteal Sets: Strengthening;Both;10 reps Long Arc Quad: AROM;Strengthening;Both;10 reps Knee Flexion: AROM;Strengthening;Both;10 reps Marching in Standing: AROM;Both;5 reps;Standing Other Exercises Other Exercises: HEP education for BLE APs, QS, GS, and LAQs x 10 each 5x/day   Assessment/Plan    PT Assessment Patient needs continued PT services  PT Problem List Decreased strength;Decreased activity tolerance;Decreased balance;Decreased mobility;Decreased knowledge of use of DME;Pain       PT Treatment Interventions DME instruction;Gait training;Stair training;Functional mobility training;Therapeutic activities;Therapeutic exercise;Balance training;Patient/family education    PT Goals (Current goals can be found in the Care Plan section)  Acute Rehab PT Goals Patient Stated Goal: To walk without pain PT Goal Formulation: With patient Time For Goal Achievement: 07/20/19 Potential to Achieve Goals: Good    Frequency 7X/week   Barriers to discharge        Co-evaluation               AM-PAC PT "6 Clicks" Mobility  Outcome Measure Help needed turning from  your back to your side while in a flat bed without using bedrails?: A Little Help needed moving from lying on your back to sitting on the side of a flat bed without using bedrails?: A Little Help needed moving to and from a bed to a chair (including a wheelchair)?: A Little Help needed standing up from a chair using your arms (e.g., wheelchair or bedside chair)?: A Little Help needed to walk in hospital room?: A Little Help needed climbing 3-5 steps with a railing? : A Little 6 Click Score: 18    End of Session Equipment Utilized During Treatment: Gait belt Activity Tolerance: Patient tolerated treatment well Patient left: in chair;with call bell/phone within reach;with chair alarm set;with SCD's reapplied Nurse Communication: Mobility status PT Visit Diagnosis: Unsteadiness on feet (R26.81);Muscle weakness (generalized) (M62.81);Other abnormalities of gait and mobility (R26.89);Pain Pain - part of body:  (sacrum)    Time: 7846-9629 PT Time Calculation (min) (ACUTE ONLY): 50 min   Charges:   PT Evaluation $PT Eval Moderate Complexity: 1 Mod PT Treatments $Gait Training: 8-22 mins $Therapeutic Exercise: 8-22 mins       D. Scott Tarica Harl PT, DPT 07/07/19, 1:29 PM

## 2019-07-07 NOTE — Care Management Important Message (Signed)
Important Message  Patient Details  Name: Lisa Mills MRN: [id number] Date of Birth: [birth year]   Medicare Important Message Given:  Yes     Su Hilt, RN 07/07/2019, 2:36 PM

## 2019-07-07 NOTE — Progress Notes (Signed)
Patient appears much more comfortable this morning.  Can turn in bed with less pain although there is still some anterior left-sided pain secondary to her pelvic fracture.  Hope she can be mobilized better today with less pain medication requirement.

## 2019-07-07 NOTE — Plan of Care (Signed)

## 2019-07-09 ENCOUNTER — Emergency Department: Payer: PPO

## 2019-07-09 ENCOUNTER — Inpatient Hospital Stay
Admission: EM | Admit: 2019-07-09 | Discharge: 2019-07-13 | DRG: 948 | Disposition: A | Discharge status: Home Health | Payer: PPO | Attending: Internal Medicine | Admitting: Internal Medicine

## 2019-07-09 ENCOUNTER — Encounter: Payer: Self-pay | Admitting: Emergency Medicine

## 2019-07-09 ENCOUNTER — Other Ambulatory Visit: Payer: Self-pay

## 2019-07-09 DIAGNOSIS — E039 Hypothyroidism, unspecified: Secondary | ICD-10-CM | POA: Diagnosis present

## 2019-07-09 DIAGNOSIS — R102 Pelvic and perineal pain: Secondary | ICD-10-CM

## 2019-07-09 DIAGNOSIS — K5903 Drug induced constipation: Secondary | ICD-10-CM | POA: Diagnosis not present

## 2019-07-09 DIAGNOSIS — E1122 Type 2 diabetes mellitus with diabetic chronic kidney disease: Secondary | ICD-10-CM | POA: Diagnosis present

## 2019-07-09 DIAGNOSIS — E871 Hypo-osmolality and hyponatremia: Secondary | ICD-10-CM | POA: Diagnosis not present

## 2019-07-09 DIAGNOSIS — K219 Gastro-esophageal reflux disease without esophagitis: Secondary | ICD-10-CM | POA: Diagnosis present

## 2019-07-09 DIAGNOSIS — W19XXXD Unspecified fall, subsequent encounter: Secondary | ICD-10-CM | POA: Diagnosis present

## 2019-07-09 DIAGNOSIS — Y92009 Unspecified place in unspecified non-institutional (private) residence as the place of occurrence of the external cause: Secondary | ICD-10-CM | POA: Diagnosis not present

## 2019-07-09 DIAGNOSIS — D649 Anemia, unspecified: Secondary | ICD-10-CM | POA: Diagnosis not present

## 2019-07-09 DIAGNOSIS — Z9889 Other specified postprocedural states: Secondary | ICD-10-CM

## 2019-07-09 DIAGNOSIS — Z20822 Contact with and (suspected) exposure to covid-19: Secondary | ICD-10-CM | POA: Diagnosis present

## 2019-07-09 DIAGNOSIS — T40605A Adverse effect of unspecified narcotics, initial encounter: Secondary | ICD-10-CM | POA: Diagnosis present

## 2019-07-09 DIAGNOSIS — E119 Type 2 diabetes mellitus without complications: Secondary | ICD-10-CM

## 2019-07-09 DIAGNOSIS — G8918 Other acute postprocedural pain: Secondary | ICD-10-CM | POA: Diagnosis not present

## 2019-07-09 DIAGNOSIS — B9620 Unspecified Escherichia coli [E. coli] as the cause of diseases classified elsewhere: Secondary | ICD-10-CM | POA: Diagnosis present

## 2019-07-09 DIAGNOSIS — M5418 Radiculopathy, sacral and sacrococcygeal region: Secondary | ICD-10-CM | POA: Diagnosis present

## 2019-07-09 DIAGNOSIS — R350 Frequency of micturition: Secondary | ICD-10-CM | POA: Diagnosis not present

## 2019-07-09 DIAGNOSIS — E785 Hyperlipidemia, unspecified: Secondary | ICD-10-CM

## 2019-07-09 DIAGNOSIS — D631 Anemia in chronic kidney disease: Secondary | ICD-10-CM | POA: Diagnosis present

## 2019-07-09 DIAGNOSIS — K5900 Constipation, unspecified: Secondary | ICD-10-CM | POA: Diagnosis not present

## 2019-07-09 DIAGNOSIS — S32592D Other specified fracture of left pubis, subsequent encounter for fracture with routine healing: Secondary | ICD-10-CM | POA: Diagnosis not present

## 2019-07-09 DIAGNOSIS — Z03818 Encounter for observation for suspected exposure to other biological agents ruled out: Secondary | ICD-10-CM | POA: Diagnosis not present

## 2019-07-09 DIAGNOSIS — F329 Major depressive disorder, single episode, unspecified: Secondary | ICD-10-CM | POA: Diagnosis present

## 2019-07-09 DIAGNOSIS — N1831 Chronic kidney disease, stage 3a: Secondary | ICD-10-CM | POA: Diagnosis present

## 2019-07-09 DIAGNOSIS — Z8619 Personal history of other infectious and parasitic diseases: Secondary | ICD-10-CM

## 2019-07-09 DIAGNOSIS — I129 Hypertensive chronic kidney disease with stage 1 through stage 4 chronic kidney disease, or unspecified chronic kidney disease: Secondary | ICD-10-CM | POA: Diagnosis present

## 2019-07-09 DIAGNOSIS — M533 Sacrococcygeal disorders, not elsewhere classified: Secondary | ICD-10-CM

## 2019-07-09 LAB — URINALYSIS, COMPLETE (UACMP) WITH MICROSCOPIC
Bacteria, UA: NONE SEEN
Bilirubin Urine: NEGATIVE
Glucose, UA: NEGATIVE mg/dL
Ketones, ur: NEGATIVE mg/dL
Leukocytes,Ua: NEGATIVE
Nitrite: NEGATIVE
Protein, ur: NEGATIVE mg/dL
Specific Gravity, Urine: 1.006 (ref 1.005–1.030)
Squamous Epithelial / HPF: NONE SEEN (ref 0–5)
pH: 5 (ref 5.0–8.0)

## 2019-07-09 LAB — COMPREHENSIVE METABOLIC PANEL
ALT: 13 U/L (ref 0–44)
AST: 25 U/L (ref 15–41)
Albumin: 4 g/dL (ref 3.5–5.0)
Alkaline Phosphatase: 88 U/L (ref 38–126)
Anion gap: 13 (ref 5–15)
BUN: 15 mg/dL (ref 8–23)
CO2: 21 mmol/L — ABNORMAL LOW (ref 22–32)
Calcium: 10 mg/dL (ref 8.9–10.3)
Chloride: 96 mmol/L — ABNORMAL LOW (ref 98–111)
Creatinine, Ser: 1.09 mg/dL — ABNORMAL HIGH (ref 0.44–1.00)
GFR calc Af Amer: 53 mL/min — ABNORMAL LOW (ref >60)
GFR calc non Af Amer: 46 mL/min — ABNORMAL LOW (ref >60)
Glucose, Bld: 116 mg/dL — ABNORMAL HIGH (ref 70–99)
Potassium: 4.4 mmol/L (ref 3.5–5.1)
Sodium: 130 mmol/L — ABNORMAL LOW (ref 135–145)
Total Bilirubin: 0.8 mg/dL (ref 0.3–1.2)
Total Protein: 7.2 g/dL (ref 6.5–8.1)

## 2019-07-09 LAB — CBC WITH DIFFERENTIAL/PLATELET
Abs Immature Granulocytes: 0.06 10*3/uL (ref 0.00–0.07)
Basophils Absolute: 0.1 10*3/uL (ref 0.0–0.1)
Basophils Relative: 1 %
Eosinophils Absolute: 0.2 10*3/uL (ref 0.0–0.5)
Eosinophils Relative: 2 %
HCT: 32.3 % — ABNORMAL LOW (ref 36.0–46.0)
Hemoglobin: 11.2 g/dL — ABNORMAL LOW (ref 12.0–15.0)
Immature Granulocytes: 1 %
Lymphocytes Relative: 12 %
Lymphs Abs: 1.5 10*3/uL (ref 0.7–4.0)
MCH: 31.2 pg (ref 26.0–34.0)
MCHC: 34.7 g/dL (ref 30.0–36.0)
MCV: 90 fL (ref 80.0–100.0)
Monocytes Absolute: 1 10*3/uL (ref 0.1–1.0)
Monocytes Relative: 8 %
Neutro Abs: 9.7 10*3/uL — ABNORMAL HIGH (ref 1.7–7.7)
Neutrophils Relative %: 76 %
Platelets: 387 10*3/uL (ref 150–400)
RBC: 3.59 MIL/uL — ABNORMAL LOW (ref 3.87–5.11)
RDW: 12.7 % (ref 11.5–15.5)
WBC: 12.5 10*3/uL — ABNORMAL HIGH (ref 4.0–10.5)
nRBC: 0 % (ref 0.0–0.2)

## 2019-07-09 LAB — URINE CULTURE: Culture: NO GROWTH

## 2019-07-09 MED ORDER — ACETAMINOPHEN 650 MG RE SUPP: 650.0000 mg | Freq: Four times a day (QID) | RECTAL | Status: DC | PRN

## 2019-07-09 MED ORDER — DOCUSATE SODIUM 100 MG PO CAPS
100.0000 mg | ORAL_CAPSULE | Freq: Two times a day (BID) | ORAL | Status: DC
  Administered 2019-07-10 – 2019-07-13 (×8): 100 mg via ORAL
  Filled 2019-07-09 (×8): qty 1

## 2019-07-09 MED ORDER — SODIUM CHLORIDE 0.9 % IV SOLN: INTRAVENOUS | Status: DC

## 2019-07-09 MED ORDER — CYCLOBENZAPRINE HCL 10 MG PO TABS: 5.0000 mg | ORAL_TABLET | Freq: Three times a day (TID) | ORAL | Status: DC

## 2019-07-09 MED ORDER — LEVOTHYROXINE SODIUM 25 MCG PO TABS
25.0000 ug | ORAL_TABLET | Freq: Every day | ORAL | Status: DC
  Administered 2019-07-10 – 2019-07-13 (×4): 25 ug via ORAL
  Filled 2019-07-09 (×4): qty 1

## 2019-07-09 MED ORDER — LISINOPRIL 5 MG PO TABS: 5.0000 mg | ORAL_TABLET | Freq: Every day | ORAL | Status: DC

## 2019-07-09 MED ORDER — FLEET ENEMA 7-19 GM/118ML RE ENEM: 1.0000 | ENEMA | Freq: Once | RECTAL | Status: DC | PRN

## 2019-07-09 MED ORDER — SENNA 8.6 MG PO TABS: 1.0000 | ORAL_TABLET | Freq: Two times a day (BID) | ORAL | Status: DC

## 2019-07-09 MED ORDER — VITAMIN D 25 MCG (1000 UNIT) PO TABS
1000.0000 [IU] | ORAL_TABLET | Freq: Every day | ORAL | Status: DC
  Administered 2019-07-10 – 2019-07-13 (×4): 1000 [IU] via ORAL
  Filled 2019-07-09 (×4): qty 1

## 2019-07-09 MED ORDER — GLIMEPIRIDE 2 MG PO TABS: 2.0000 mg | ORAL_TABLET | Freq: Every day | ORAL | Status: DC

## 2019-07-09 MED ORDER — ONDANSETRON HCL 4 MG PO TABS: 4.0000 mg | ORAL_TABLET | Freq: Four times a day (QID) | ORAL | Status: DC | PRN

## 2019-07-09 MED ORDER — INSULIN ASPART 100 UNIT/ML ~~LOC~~ SOLN
0.0000 [IU] | Freq: Three times a day (TID) | SUBCUTANEOUS | Status: DC
  Administered 2019-07-11: 2 [IU] via SUBCUTANEOUS
  Filled 2019-07-09: qty 1

## 2019-07-09 MED ORDER — MORPHINE SULFATE (PF) 2 MG/ML IV SOLN
2.0000 mg | INTRAVENOUS | Status: DC | PRN
  Administered 2019-07-11: 2 mg via INTRAVENOUS
  Filled 2019-07-09: qty 1

## 2019-07-09 MED ORDER — PANTOPRAZOLE SODIUM 40 MG PO TBEC
40.0000 mg | DELAYED_RELEASE_TABLET | Freq: Every day | ORAL | Status: DC
  Administered 2019-07-10 – 2019-07-13 (×4): 40 mg via ORAL
  Filled 2019-07-09 (×4): qty 1

## 2019-07-09 MED ORDER — ONDANSETRON HCL 4 MG/2ML IJ SOLN: 4.0000 mg | Freq: Four times a day (QID) | INTRAMUSCULAR | Status: DC | PRN

## 2019-07-09 MED ORDER — TRAZODONE HCL 50 MG PO TABS: 25.0000 mg | ORAL_TABLET | Freq: Every evening | ORAL | Status: DC | PRN

## 2019-07-09 MED ORDER — SORBITOL 70 % SOLN
30.0000 mL | Freq: Every day | Status: DC | PRN
  Filled 2019-07-09: qty 30

## 2019-07-09 MED ORDER — ESCITALOPRAM OXALATE 10 MG PO TABS
5.0000 mg | ORAL_TABLET | Freq: Every day | ORAL | Status: DC
  Administered 2019-07-10 – 2019-07-13 (×4): 5 mg via ORAL
  Filled 2019-07-09 (×4): qty 0.5

## 2019-07-09 MED ORDER — MAGNESIUM HYDROXIDE 400 MG/5ML PO SUSP: 30.0000 mL | Freq: Every day | ORAL | Status: DC | PRN

## 2019-07-09 MED ORDER — MUPIROCIN 2 % EX OINT: 1.0000 "application " | TOPICAL_OINTMENT | Freq: Two times a day (BID) | CUTANEOUS | Status: DC

## 2019-07-09 MED ORDER — SIMVASTATIN 20 MG PO TABS
40.0000 mg | ORAL_TABLET | Freq: Every day | ORAL | Status: DC
  Administered 2019-07-10 – 2019-07-12 (×4): 40 mg via ORAL
  Filled 2019-07-09 (×4): qty 2

## 2019-07-09 MED ORDER — OXYCODONE-ACETAMINOPHEN 5-325 MG PO TABS
1.0000 | ORAL_TABLET | Freq: Once | ORAL | Status: AC
  Administered 2019-07-09: 1 via ORAL
  Filled 2019-07-09: qty 1

## 2019-07-09 MED ORDER — ENOXAPARIN SODIUM 30 MG/0.3ML ~~LOC~~ SOLN
30.0000 mg | SUBCUTANEOUS | Status: DC
  Administered 2019-07-10 – 2019-07-12 (×4): 30 mg via SUBCUTANEOUS
  Filled 2019-07-09 (×5): qty 0.3

## 2019-07-09 MED ORDER — ACETAMINOPHEN 325 MG PO TABS
650.0000 mg | ORAL_TABLET | Freq: Four times a day (QID) | ORAL | Status: DC | PRN
  Administered 2019-07-10 – 2019-07-13 (×7): 650 mg via ORAL
  Filled 2019-07-09 (×7): qty 2

## 2019-07-09 NOTE — ED Notes (Signed)
Pt changed and purewick placed.

## 2019-07-09 NOTE — ED Provider Notes (Signed)
[facility] Emergency Department Provider Note   ____________________________________________    I have reviewed the triage vital signs and the nursing notes.   HISTORY  Chief Complaint Pelvic pain, back pain, constipation    HPI Lisa Mills is a 86 y.o. female recently discharged from the hospital after sacral plasty secondary to pelvic fractures, sacral fractures.  She has been on narcotic pain medication and has not been able to have a bowel movement in quite some time.  Has been on multiple stool softeners, magnesium citrate and laxatives at home without success.  She has been backing down on her narcotic pain medication and is having significant pain.  Past Medical History:  Diagnosis Date  . Dermatophytosis of nail   . GERD (gastroesophageal reflux disease)   . History of shingles   . Hypertension   . Keratoderma     Patient Active Problem List   Diagnosis Date Noted  . Sacral fracture, closed ([place]) 07/04/2019  . Fall 07/04/2019  . Bilateral pubic rami fractures, sequela 07/04/2019  . Background diabetic retinopathy ([place]) 01/23/2019  . Schatzki's ring 03/04/2017  . Post herpetic neuralgia 10/06/2016  . Hx of colonic polyps 09/22/2016  . Osteopenia 09/02/2016  . Anxiety associated with depression 08/25/2016  . Trapezius muscle spasm 05/25/2016  . Intermittent left lower quadrant abdominal pain 03/16/2016  . Anemia in chronic kidney disease (CKD) 02/26/2016  . Type 2 diabetes mellitus with stage 4 chronic kidney disease ([place]) 02/25/2016  . Hypothyroidism 02/25/2016  . Constipation 02/25/2016  . GERD (gastroesophageal reflux disease) 02/25/2016  . Hyperlipidemia associated with type 2 diabetes mellitus ([place]) 02/25/2016  . DJD (degenerative joint disease) of cervical spine 02/25/2016  . Osteoarthritis of multiple joints 02/25/2016  . Hiatal hernia 02/25/2016  . CKD stage 3 due to type 2 diabetes mellitus ([place]) 02/25/2016  . Chronic  kidney disease, unspecified 09/05/2013  . Benign hypertension with CKD (chronic kidney disease) stage III 09/05/2013  . CMC arthritis, thumb, degenerative 06/15/2013  . Onychomycosis due to dermatophyte 05/25/2013  . Acquired keratoderma 05/25/2013    Past Surgical History:  Procedure Laterality Date  . BREAST BIOPSY Right 09/30/2017   Affirm bx-calcs ( X clip),benign  . BREAST EXCISIONAL BIOPSY Right    neg  . cataracts    . COLONOSCOPY W/ POLYPECTOMY    . COLONOSCOPY WITH PROPOFOL N/A 02/10/2017   Procedure: COLONOSCOPY WITH PROPOFOL;  Surgeon: Manya Silvas, MD;  Location: [facility] ENDOSCOPY;  Service: Endoscopy;  Laterality: N/A;  . ESOPHAGOGASTRODUODENOSCOPY (EGD) WITH PROPOFOL N/A 02/10/2017   Procedure: ESOPHAGOGASTRODUODENOSCOPY (EGD) WITH PROPOFOL;  Surgeon: Manya Silvas, MD;  Location: [facility] ENDOSCOPY;  Service: Endoscopy;  Laterality: N/A;  . EYE SURGERY     bilateral cataracts  . HAND SURGERY    . KIDNEY STONE SURGERY    . SACROPLASTY N/A 07/06/2019   Procedure: SACROPLASTY;  Surgeon: Hessie Knows, MD;  Location: [facility] ORS;  Service: Orthopedics;  Laterality: N/A;  . TONSILECTOMY, ADENOIDECTOMY, BILATERAL MYRINGOTOMY AND TUBES    . TONSILLECTOMY    . TRIGGER FINGER RELEASE     Left ring finger  . tubercular peritonitis      Prior to Admission medications   Medication Sig Start Date End Date Taking? Authorizing Provider  Biotin 1000 MCG tablet Take by mouth. Patient not taking: Reported on 07/04/2019    [provider]  Blood Glucose Monitoring Suppl ([place]) w/Device KIT Use glucometer to check blood sugar daily. 02/25/16  Karamalegos, Devonne Doughty, DO  cholecalciferol (VITAMIN D) 1000 units tablet Take 1,000 Units by mouth daily.    [provider]  cyclobenzaprine (FLEXERIL) 5 MG tablet Take 1 tablet (5 mg total) by mouth 3 (three) times daily. 07/07/19   Lorella Nimrod, MD  escitalopram (LEXAPRO) 5 MG tablet TAKE 1 TABLET BY MOUTH  ONCE DAILY WITH FOOD 08/17/18   Parks Ranger, Devonne Doughty, DO  glimepiride (AMARYL) 4 MG tablet Take 0.5 tablets (2 mg total) by mouth daily. With meal, either breakfast or dinner. 03/15/19   Karamalegos, Devonne Doughty, DO  glucose blood (ONE TOUCH ULTRA TEST) test strip CHECK BLOOD SUGAR UP TO 2 TIMES A DAY. 03/07/18   Karamalegos, Devonne Doughty, DO  levothyroxine (SYNTHROID) 25 MCG tablet TAKE 1 TABLET BY MOUTH ONCE DAILY ON AN EMPTY STOMACH. WAIT 30 MINUTES BEFORE TAKING OTHER MEDS. 03/10/19   Olin Hauser, DO  lisinopril (ZESTRIL) 5 MG tablet TAKE 1 TABLET BY MOUTH ONCE DAILY 04/25/19   Parks Ranger, Devonne Doughty, DO  mupirocin ointment (BACTROBAN) 2 % Apply 1 application topically 2 (two) times daily. For 1-2 weeks Patient not taking: Reported on 07/04/2019 06/16/19   Olin Hauser, DO  omeprazole (PRILOSEC) 20 MG capsule TAKE 1 CAPSULE BY MOUTH ONCE DAILY 02/06/19   Karamalegos, Devonne Doughty, DO  ONETOUCH DELICA LANCETS 66A MISC 1 each by Does not apply route 3 (three) times daily. Dx:E11.9 04/14/16   Karamalegos, Devonne Doughty, DO  oxyCODONE (OXY IR/ROXICODONE) 5 MG immediate release tablet Take 1 tablet (5 mg total) by mouth every 4 (four) hours as needed for severe pain. 07/07/19   Lorella Nimrod, MD  Probiotic Product (PROBIOTIC-10 PO) Take by mouth. Patient not taking: Reported on 07/04/2019    [provider]  senna (SENOKOT) 8.6 MG TABS tablet Take 1 tablet (8.6 mg total) by mouth 2 (two) times daily. 07/07/19   Lorella Nimrod, MD  simvastatin (ZOCOR) 40 MG tablet TAKE 1 TABLET BY MOUTH AT BEDTIME 01/19/19   Karamalegos, Devonne Doughty, DO  traMADol (ULTRAM) 50 MG tablet Take 50 mg by mouth every 12 (twelve) hours as needed for moderate pain.    [provider]     Allergies Aspirin, Conray [iothalamate], Dye fdc red [red dye], Sulfa antibiotics, and Prednisone  Family History  Adopted: Yes  Problem Relation Age of Onset  . Breast cancer Neg Hx     Social  History Social History   Tobacco Use  . Smoking status: Never Smoker  . Smokeless tobacco: Never Used  Vaping Use  . Vaping Use: Never used  Substance Use Topics  . Alcohol use: Not Currently  . Drug use: No    Review of Systems  Constitutional: No fever/chills Eyes: No visual changes.  ENT: No sore throat. Cardiovascular: Denies chest pain. Respiratory: Denies shortness of breath. Gastrointestinal: Constipation as above Genitourinary: Negative for dysuria. Musculoskeletal: Pelvic pain and low back pain as above Skin: Negative for rash. Neurological: Negative for headaches or weakness   ____________________________________________   PHYSICAL EXAM:  VITAL SIGNS: ED Triage Vitals  Enc Vitals Group     BP 07/09/19 1424 (!) 143/76     Pulse Rate 07/09/19 1424 (!) 104     Resp 07/09/19 1424 18     Temp 07/09/19 1424 98.5 F (36.9 C)     Temp Source 07/09/19 1424 Oral     SpO2 07/09/19 1424 96 %     Weight 07/09/19 1431 57 kg (125 lb 10.6 oz)  Height 07/09/19 1431 1.499 m (4' 11")     Head Circumference --      Peak Flow --      Pain Score 07/09/19 1430 10     Pain Loc --      Pain Edu? --      Excl. in [place]? --     Constitutional: Alert and oriented. Eyes: Conjunctivae are normal.   Mouth/Throat: Mucous membranes are moist.    Cardiovascular: Normal rate, regular rhythm. Grossly normal heart sounds.  Good peripheral circulation. Respiratory: Normal respiratory effort.  No retractions. Lungs CTAB. Gastrointestinal: nontender. No distention.  No CVA tenderness.  Musculoskeletal: Normal strength in lower extremities, no saddle anesthesia.  Warm and well perfused Neurologic:  Normal speech and language. No gross focal neurologic deficits are appreciated.  Skin:  Skin is warm, dry and intact. No rash noted. Psychiatric: Mood and affect are normal. Speech and behavior are normal.  ____________________________________________   LABS (all labs ordered are  listed, but only abnormal results are displayed)  Labs Reviewed  CBC WITH DIFFERENTIAL/PLATELET - Abnormal; Notable for the following components:      Result Value   WBC 12.5 (*)    RBC 3.59 (*)    Hemoglobin 11.2 (*)    HCT 32.3 (*)    Neutro Abs 9.7 (*)    All other components within normal limits  COMPREHENSIVE METABOLIC PANEL - Abnormal; Notable for the following components:   Sodium 130 (*)    Chloride 96 (*)    CO2 21 (*)    Glucose, Bld 116 (*)    Creatinine, Ser 1.09 (*)    GFR calc non Af Amer 46 (*)    GFR calc Af Amer 53 (*)    All other components within normal limits  URINALYSIS, COMPLETE (UACMP) WITH MICROSCOPIC - Abnormal; Notable for the following components:   Color, Urine STRAW (*)    APPearance CLEAR (*)    Hgb urine dipstick MODERATE (*)    All other components within normal limits  SARS CORONAVIRUS 2 BY RT PCR (HOSPITAL ORDER, [place] [facility])   ____________________________________________  EKG  None ____________________________________________  RADIOLOGY  KUB reviewed by me, demonstrates stool to the hepatic flexure ____________________________________________   PROCEDURES  Procedure(s) performed: No  Procedures   Critical Care performed: No ____________________________________________   INITIAL IMPRESSION / ASSESSMENT AND PLAN / ED COURSE  Pertinent labs & imaging results that were available during my care of the patient were reviewed by me and considered in my medical decision making (see chart for details).  Patient presents with significant constipation, continued low back/pelvic pain from her fractures.  Has tried multiple medications for constipation unsuccessfully.  Pelvic pain and back pain has limited her motion considerably and she does not feel that her symptoms are improving.  She and her son are frustrated by her lack of improvement.  Lab work today is overall reassuring, x-ray demonstrates stool in the  hepatic flexure  Patient would not do well with enema here given her low back and pelvic pain from her fractures.  Have discussed with the hospitalist admission for pain management and significant constipation    ____________________________________________   FINAL CLINICAL IMPRESSION(S) / ED DIAGNOSES  Final diagnoses:  Pelvic pain  Drug-induced constipation        Note:  This document was prepared using Dragon voice recognition software and may include unintentional dictation errors.   Lavonia Drafts, MD 07/09/19 2231

## 2019-07-09 NOTE — Anesthesia Postprocedure Evaluation (Signed)
Anesthesia Post Note  Patient: Lisa Mills  Procedure(s) Performed: SACROPLASTY (N/A )  Patient location during evaluation: PACU Anesthesia Type: General Level of consciousness: awake and alert and oriented Pain management: pain level controlled Vital Signs Assessment: post-procedure vital signs reviewed and stable Respiratory status: spontaneous breathing Cardiovascular status: blood pressure returned to baseline Anesthetic complications: no   No complications documented.   Last Vitals:  Vitals:   07/07/19 1139 07/07/19 1601  BP: (!) 127/56 (!) 148/65  Pulse: 70 78  Resp: 17 16  Temp: 37.1 C 37 C  SpO2: 94% 95%    Last Pain:  Vitals:   07/07/19 1601  TempSrc: Oral  PainSc:                  [place]

## 2019-07-09 NOTE — ED Notes (Signed)
Pt reports mechanical fall x 1 month ago and has bene taking tramdaol for the pain since then. Pt had back surgery 6/18. Reports last good bowel movement was approx 2-3 weeks ago. Has been passing gas. Pt reports taking a stool softener daily but today took mag citrate with no relief.

## 2019-07-09 NOTE — ED Notes (Signed)
Pt changed by Bri RN, and Wilfred Lacy EDT. Pt saturated with urine.

## 2019-07-10 ENCOUNTER — Inpatient Hospital Stay: Payer: PPO | Admitting: Family Medicine

## 2019-07-10 DIAGNOSIS — I10 Essential (primary) hypertension: Secondary | ICD-10-CM

## 2019-07-10 LAB — GLUCOSE, CAPILLARY
Glucose-Capillary: 49 mg/dL — ABNORMAL LOW (ref 70–99)
Glucose-Capillary: 128 mg/dL — ABNORMAL HIGH (ref 70–99)
Glucose-Capillary: 41 mg/dL — CL (ref 70–99)
Glucose-Capillary: 100 mg/dL — ABNORMAL HIGH (ref 70–99)
Glucose-Capillary: 132 mg/dL — ABNORMAL HIGH (ref 70–99)
Glucose-Capillary: 109 mg/dL — ABNORMAL HIGH (ref 70–99)

## 2019-07-10 LAB — BASIC METABOLIC PANEL
Anion gap: 6 (ref 5–15)
BUN: 14 mg/dL (ref 8–23)
CO2: 29 mmol/L (ref 22–32)
Calcium: 9.1 mg/dL (ref 8.9–10.3)
Chloride: 98 mmol/L (ref 98–111)
Creatinine, Ser: 1.08 mg/dL — ABNORMAL HIGH (ref 0.44–1.00)
GFR calc Af Amer: 53 mL/min — ABNORMAL LOW (ref >60)
GFR calc non Af Amer: 46 mL/min — ABNORMAL LOW (ref >60)
Glucose, Bld: 53 mg/dL — ABNORMAL LOW (ref 70–99)
Potassium: 3.9 mmol/L (ref 3.5–5.1)
Sodium: 133 mmol/L — ABNORMAL LOW (ref 135–145)

## 2019-07-10 LAB — CBC
HCT: 27.4 % — ABNORMAL LOW (ref 36.0–46.0)
Hemoglobin: 9.5 g/dL — ABNORMAL LOW (ref 12.0–15.0)
MCH: 30.7 pg (ref 26.0–34.0)
MCHC: 34.7 g/dL (ref 30.0–36.0)
MCV: 88.7 fL (ref 80.0–100.0)
Platelets: 357 10*3/uL (ref 150–400)
RBC: 3.09 MIL/uL — ABNORMAL LOW (ref 3.87–5.11)
RDW: 12.7 % (ref 11.5–15.5)
WBC: 9.2 10*3/uL (ref 4.0–10.5)
nRBC: 0 % (ref 0.0–0.2)

## 2019-07-10 LAB — SARS CORONAVIRUS 2 BY RT PCR (HOSPITAL ORDER, PERFORMED IN ~~LOC~~ HOSPITAL LAB): SARS Coronavirus 2: NEGATIVE

## 2019-07-10 MED ORDER — DEXTROSE 50 % IV SOLN
1.0000 | Freq: Once | INTRAVENOUS | Status: AC
  Administered 2019-07-10: 50 mL via INTRAVENOUS
  Filled 2019-07-10: qty 50

## 2019-07-10 MED ORDER — LISINOPRIL 20 MG PO TABS
40.0000 mg | ORAL_TABLET | Freq: Every day | ORAL | Status: DC
  Administered 2019-07-10 – 2019-07-13 (×4): 40 mg via ORAL
  Filled 2019-07-10 (×4): qty 2

## 2019-07-10 MED ORDER — FLEET ENEMA 7-19 GM/118ML RE ENEM
1.0000 | ENEMA | Freq: Once | RECTAL | Status: AC
  Administered 2019-07-10: 1 via RECTAL

## 2019-07-10 MED ORDER — POLYETHYLENE GLYCOL 3350 17 G PO PACK
17.0000 g | PACK | Freq: Two times a day (BID) | ORAL | Status: DC
  Administered 2019-07-10: 17 g via ORAL
  Filled 2019-07-10: qty 1

## 2019-07-10 MED ORDER — NALOXEGOL OXALATE 12.5 MG PO TABS
12.5000 mg | ORAL_TABLET | Freq: Every day | ORAL | Status: DC
  Administered 2019-07-10 – 2019-07-12 (×3): 12.5 mg via ORAL
  Filled 2019-07-10 (×4): qty 1

## 2019-07-10 MED ORDER — PEG 3350-KCL-NA BICARB-NACL 420 G PO SOLR
4000.0000 mL | Freq: Once | ORAL | Status: AC
  Administered 2019-07-10: 4000 mL via ORAL
  Filled 2019-07-10: qty 4000

## 2019-07-10 NOTE — Consult Note (Signed)
Lisa Mills , MD [address] [place], [place], [place], [address] [address], [place], [place], [place], [address] Phone: [phone]  Fax: [phone]  Consultation  Referring Provider:    Dr Sidney Ace  Primary Care Physician:  Olin Hauser, DO Primary Gastroenterologist:  Dr. Tiffany Kocher        Reason for Consultation:     Constipation   Date of Admission:  07/09/2019 Date of Consultation:  07/10/2019         HPI:   Lisa Mills is a 86 y.o. female who suffers from chronic constipation since 2018 and last seen by Dr Tiffany Kocher in 2019 . Last colonoscopy was a poor prep in 2019 and she has a history of colon polyps, at that time no polyps were seen . Last visit was prescribed colace.    07/09/2019: X ray abdomen - moderate stool at the hepatic flexure and descending colon . No fecal impaction .  Displaced fractures of the left pubic body and left superior pubic ramus as demonstrated on the prior CT scan.  Admitted with no bowel movements for atleast 7 days , has been on oxycodone , tramadol.  Patient states that she has not had a bowel movement at least for 2 weeks.  Says she feels distended and bloated.  Says she struggles to expel the stool.  Past Medical History:  Diagnosis Date  . Dermatophytosis of nail   . GERD (gastroesophageal reflux disease)   . History of shingles   . Hypertension   . Keratoderma     Past Surgical History:  Procedure Laterality Date  . BREAST BIOPSY Right 09/30/2017   Affirm bx-calcs ( X clip),benign  . BREAST EXCISIONAL BIOPSY Right    neg  . cataracts    . COLONOSCOPY W/ POLYPECTOMY    . COLONOSCOPY WITH PROPOFOL N/A 02/10/2017   Procedure: COLONOSCOPY WITH PROPOFOL;  Surgeon: Manya Silvas, MD;  Location: [facility] ENDOSCOPY;  Service: Endoscopy;  Laterality: N/A;  . ESOPHAGOGASTRODUODENOSCOPY (EGD) WITH PROPOFOL N/A 02/10/2017   Procedure: ESOPHAGOGASTRODUODENOSCOPY (EGD) WITH PROPOFOL;  Surgeon: Manya Silvas, MD;  Location:  [facility] ENDOSCOPY;  Service: Endoscopy;  Laterality: N/A;  . EYE SURGERY     bilateral cataracts  . HAND SURGERY    . KIDNEY STONE SURGERY    . SACROPLASTY N/A 07/06/2019   Procedure: SACROPLASTY;  Surgeon: Hessie Knows, MD;  Location: [facility] ORS;  Service: Orthopedics;  Laterality: N/A;  . TONSILECTOMY, ADENOIDECTOMY, BILATERAL MYRINGOTOMY AND TUBES    . TONSILLECTOMY    . TRIGGER FINGER RELEASE     Left ring finger  . tubercular peritonitis      Prior to Admission medications   Medication Sig Start Date End Date Taking? Authorizing Provider  cholecalciferol (VITAMIN D) 1000 units tablet Take 1,000 Units by mouth daily.   Yes [provider]  cyclobenzaprine (FLEXERIL) 5 MG tablet Take 1 tablet (5 mg total) by mouth 3 (three) times daily. 07/07/19  Yes Lorella Nimrod, MD  escitalopram (LEXAPRO) 5 MG tablet TAKE 1 TABLET BY MOUTH ONCE DAILY WITH FOOD Patient taking differently: Take 5 mg by mouth daily.  08/17/18  Yes Karamalegos, Devonne Doughty, DO  glimepiride (AMARYL) 4 MG tablet Take 0.5 tablets (2 mg total) by mouth daily. With meal, either breakfast or dinner. 03/15/19  Yes Karamalegos, Devonne Doughty, DO  levothyroxine (SYNTHROID) 25 MCG tablet TAKE 1 TABLET BY MOUTH ONCE DAILY ON AN EMPTY STOMACH. WAIT [place] OTHER MEDS. 03/10/19  Yes  Karamalegos, Alexander J, DO  lisinopril (ZESTRIL) 5 MG tablet TAKE 1 TABLET BY MOUTH ONCE DAILY 04/25/19  Yes Karamalegos, Devonne Doughty, DO  omeprazole (PRILOSEC) 20 MG capsule TAKE 1 CAPSULE BY MOUTH ONCE DAILY 02/06/19  Yes Karamalegos, Devonne Doughty, DO  oxyCODONE (OXY IR/ROXICODONE) 5 MG immediate release tablet Take 1 tablet (5 mg total) by mouth every 4 (four) hours as needed for severe pain. 07/07/19  Yes Lorella Nimrod, MD  senna (SENOKOT) 8.6 MG TABS tablet Take 1 tablet (8.6 mg total) by mouth 2 (two) times daily. 07/07/19  Yes Lorella Nimrod, MD  simvastatin (ZOCOR) 40 MG tablet TAKE 1 TABLET BY MOUTH AT BEDTIME 01/19/19  Yes Karamalegos,  Devonne Doughty, DO  traMADol (ULTRAM) 50 MG tablet Take 50 mg by mouth every 12 (twelve) hours as needed for moderate pain.   Yes [provider]  Biotin 1000 MCG tablet Take by mouth. Patient not taking: Reported on 07/04/2019    [provider]  Blood Glucose Monitoring Suppl ([place]) w/Device KIT Use glucometer to check blood sugar daily. 02/25/16   Karamalegos, Devonne Doughty, DO  glucose blood (ONE TOUCH ULTRA TEST) test strip CHECK BLOOD SUGAR UP TO 2 TIMES A DAY. 03/07/18   Karamalegos, Devonne Doughty, DO  mupirocin ointment (BACTROBAN) 2 % Apply 1 application topically 2 (two) times daily. For 1-2 weeks Patient not taking: Reported on 07/04/2019 06/16/19   Olin Hauser, DO  ONETOUCH DELICA LANCETS 09W MISC 1 each by Does not apply route 3 (three) times daily. Dx:E11.9 04/14/16   Olin Hauser, DO  Probiotic Product (PROBIOTIC-10 PO) Take by mouth. Patient not taking: Reported on 07/04/2019    [provider]    Family History  Adopted: Yes  Problem Relation Age of Onset  . Breast cancer Neg Hx      Social History   Tobacco Use  . Smoking status: Never Smoker  . Smokeless tobacco: Never Used  Vaping Use  . Vaping Use: Never used  Substance Use Topics  . Alcohol use: Not Currently  . Drug use: No    Allergies as of 07/09/2019 - Review Complete 07/09/2019  Allergen Reaction Noted  . Aspirin Other (See Comments) 02/25/2016  . Conray [iothalamate] Hives 08/27/2017  . Dye fdc red [red dye] Hives 02/25/2016  . Sulfa antibiotics  02/25/2016  . Prednisone  11/01/2018    Review of Systems:    All systems reviewed and negative except where noted in HPI.   Physical Exam:  Vital signs in last 24 hours: Temp:  [97.9 F (36.6 C)-98.7 F (37.1 C)] 98.2 F (36.8 C) (06/21 0733) Pulse Rate:  [74-104] 82 (06/21 0733) Resp:  [18-20] 18 (06/21 0733) BP: (125-167)/(56-87) 167/77 (06/21 0733) SpO2:  [91 %-98 %] 98 % (06/21  0733) Weight:  [57 kg] 57 kg (06/20 1431)   General:   Pleasant, cooperative in NAD Head:  Normocephalic and atraumatic. Eyes:   No icterus.   Conjunctiva pink. PERRLA. Ears:  Normal auditory acuity. Neck:  Supple; no masses or thyroidomegaly Lungs: Respirations even and unlabored. Lungs clear to auscultation bilaterally.   No wheezes, crackles, or rhonchi.  Heart:  Regular rate and rhythm;  Without murmur, clicks, rubs or gallops Abdomen:  Soft, nondistended, nontender. Normal bowel sounds. No appreciable masses or hepatomegaly.  No rebound or guarding.  Neurologic:  Alert and oriented x3;  grossly normal neurologically. Psych:  Alert and cooperative. Normal affect.  LAB RESULTS: Recent Labs  07/09/19 1432 07/10/19 0403  WBC 12.5* 9.2  HGB 11.2* 9.5*  HCT 32.3* 27.4*  PLT 387 357   BMET Recent Labs    07/09/19 1432 07/10/19 0403  NA 130* 133*  K 4.4 3.9  CL 96* 98  CO2 21* 29  GLUCOSE 116* 53*  BUN 15 14  CREATININE 1.09* 1.08*  CALCIUM 10.0 9.1   LFT Recent Labs    07/09/19 1432  PROT 7.2  ALBUMIN 4.0  AST 25  ALT 13  ALKPHOS 88  BILITOT 0.8   PT/INR No results for input(s): LABPROT, INR in the last 72 hours.  STUDIES: DG Abdomen 1 View  Result Date: 07/09/2019 CLINICAL DATA:  Constipation. EXAM: ABDOMEN - 1 VIEW COMPARISON:  Radiograph dated 03/16/2016 and CT scan of the pelvis dated 07/04/2019 FINDINGS: There are no dilated loops of large or small bowel. There is moderate stool in the hepatic flexure and in the splenic flexure and descending colon but there is no fecal impaction in the rectum. There is a displaced fracture of the left pubic body and left superior pubic ramus as demonstrated on the prior CT scan of 07/04/2019. The patient has undergone bilateral sacroplasty for the bilateral sacral ala fractures demonstrated on that CT scan. No acute bone abnormality. IMPRESSION: 1. Benign-appearing abdomen. Moderate stool in the hepatic flexure and  descending colon. No fecal impaction in the rectum. 2. Displaced fractures of the left pubic body and left superior pubic ramus as demonstrated on the prior CT scan. Electronically Signed   By: Lorriane Shire M.D.   On: 07/09/2019 17:42      Impression / Plan:   DEISSY GUILBERT is a 86 y.o. y/o female with  History of fracture to the pubic ramus , on oxycodone , tramadol , not had a bowel movement for 7 days  . I have been asked to see for constipation. Appears she suffers from chronic constipation per old GI notes.   Plan  1. Limit use of narcotics if possible 2. Commence on Golytely orally after a fleets enema.  I have placed orders.- she can stop drinking when she feels she has adequately cleaned out, after she has a good bowel movement suggest to start on Trulance 3 mg once daily and stop all other laxatives.  3.  Suggest to consider checking TSH  Thank you for involving me in the care of this patient.      LOS: 0 days   Lisa Bellows, MD  07/10/2019, 9:13 AM

## 2019-07-10 NOTE — Progress Notes (Signed)
Inpatient Diabetes Program Recommendations  AACE/ADA: New Consensus Statement on Inpatient Glycemic Control (2015)  Target Ranges:  Prepandial:   less than 140 mg/dL      Peak postprandial:   less than 180 mg/dL (1-2 hours)      Critically ill patients:  140 - 180 mg/dL   Results for MARISE, KNAPPER (MRN [id number]) as of 07/10/2019 12:23  Ref. Range 07/10/2019 07:32 07/10/2019 09:06 07/10/2019 11:55 07/10/2019 12:02  Glucose-Capillary Latest Ref Range: 70 - 99 mg/dL 49 (L)  50 ml D50% 128 (H) 44 (LL) 41 (LL)    Home DM Meds: Amaryl 2 mg Daily  Current Orders: Novolog Sensitive Correction Scale/ SSI (0-9 units) TID AC + HS     MD- Note patient having issues with Hypoglycemia since this AM.  NO Insulin has been given yet.  May consider discontinuation of Novolog SSI for now but continue CBG checks     --Will follow patient during hospitalization--  Wyn Quaker RN, MSN, CDE Diabetes Coordinator Inpatient Glycemic Control Team Team Pager: [phone] (8a-5p)

## 2019-07-10 NOTE — Evaluation (Signed)
Occupational Therapy Evaluation Patient Details Name: Lisa Mills MRN: [id number] DOB: [birth year] Today's Date: 07/10/2019    History of Present Illness Pt admitted for constipation following narcotic use after last admission. History includes DM, CKD, anemia, HTN, and GERD. Previous admission included sacroplasty (07/07/19) following fall.   Clinical Impression   Ms Moosman was seen for OT evaluation this date. Prior to hospital admission, pt was MOD I for mobility following recent sacroplasty. Pt lives alone and reports her daughter will stay with her for a short time upon discharge. Pt presents to acute OT demonstrating impaired ADL performance and functional mobility 2/2 decreased LB access, pain, and functional strength/ROM deficits. Pt currently requires MAX A LB access at bed level. SETUP self-feeding/drinking at bed level. Anticipate CGA toileting including perihygiene. Pt deferred mobility this session 2/2 10/10 pain at rest following PT session. Pt visualized stand>sit during PT session upon first OT eval attempt - pt required no physical assist however sitting caused pain and pt quickly returned to supine. Anticipate improved mobility/ADL performance pending pain mgmt. Pt would benefit from skilled OT to address noted impairments and functional limitations (see below for any additional details) in order to maximize safety and independence while minimizing falls risk and caregiver burden. Upon hospital discharge, recommend HHOT to maximize pt safety and return to functional independence during meaningful occupations of daily life.     Follow Up Recommendations  Home health OT;Supervision/Assistance - 24 hour    Equipment Recommendations  None recommended by OT    Recommendations for Other Services       Precautions / Restrictions Precautions Precautions: Fall Restrictions Weight Bearing Restrictions: No      Mobility Bed Mobility Overal bed mobility: Needs Assistance Bed  Mobility: Supine to Sit     Supine to sit: Min guard     General bed mobility comments: Pt reports pain 10/10 at rest, defered mobility  Transfers Overall transfer level: Needs assistance Equipment used: Rolling walker (2 wheeled) Transfers: Sit to/from Stand Sit to Stand: Supervision         General transfer comment: Visualized stand>sit c PT session upon first eval attempt - pt required no physical assist however seating caused pain and pt quickly returned to supine     Balance Overall balance assessment: Needs assistance Sitting-balance support: Feet supported;No upper extremity supported Sitting balance-Leahy Scale: Good     Standing balance support: Bilateral upper extremity supported Standing balance-Leahy Scale: Good                             ADL either performed or assessed with clinical judgement   ADL Overall ADL's : Needs assistance/impaired                                       General ADL Comments: MAX A LB access at bed level. SETUP self-feeding/drinking at bed level. Anticipate CGA toileting including perihygiene     Vision         Perception     Praxis      Pertinent Vitals/Pain Pain Assessment: 0-10 Pain Score: 10-Worst pain ever Pain Location: sacrum Pain Descriptors / Indicators: Sore Pain Intervention(s): Limited activity within patient's tolerance     Hand Dominance     Extremity/Trunk Assessment Upper Extremity Assessment Upper Extremity Assessment: Overall WFL for tasks assessed   Lower Extremity Assessment Lower Extremity  Assessment: Generalized weakness       Communication Communication Communication: No difficulties   Cognition Arousal/Alertness: Awake/alert Behavior During Therapy: WFL for tasks assessed/performed Overall Cognitive Status: Within Functional Limits for tasks assessed                                     General Comments       Exercises Exercises: Other  exercises Other Exercises Other Exercises: Pt educated re: OT role, AE dressing techniques, DME recs, toileting schedule, d/c recs, importance of supervision for mobility, falls prevention, ECS Other Exercises: Performed rolling to B sides for incontinence episode in bed. Able to roll with CGA. Upon standing, additional incontience episode noted requiring multiple stands for diaper donning and hygiene.    Shoulder Instructions      Home Living Family/patient expects to be discharged to:: Private residence Living Arrangements: Alone Available Help at Discharge: Family;Available 24 hours/day (for a short time, then family will need to go back to work) Type of Home: House Home Access: Stairs to enter Technical brewer of Steps: 4 Entrance Stairs-Rails: Right;Left;Can reach both Home Layout: One level               [place]: Grab bars - toilet;Grab bars - tub/shower;Walker - 2 wheels   Additional Comments: also has 2 wheelchairs however doesn't use at baseline      Prior Functioning/Environment Level of Independence: Independent        Comments: Pt active and Ind with amb without an AD, walks dog multiple times per day, Ind with ADLs, fall secondary to tripping over neighbor's dog, no other falls "in years". Since last admission, has only been household ambulator        OT Problem List: Decreased strength;Decreased range of motion;Decreased activity tolerance;Impaired balance (sitting and/or standing);Decreased knowledge of use of DME or AE;Pain      OT Treatment/Interventions: Self-care/ADL training;Therapeutic exercise;Energy conservation;DME and/or AE instruction;Therapeutic activities;Patient/family education;Balance training;Pain mgmt    OT Goals(Current goals can be found in the care plan section) Acute Rehab OT Goals Patient Stated Goal: To walk without pain OT Goal Formulation: With patient Time For Goal Achievement: 07/24/19 Potential to Achieve Goals:  Good ADL Goals Pt Will Perform Grooming: sitting;with modified independence Pt Will Perform Lower Body Dressing: with set-up;with supervision;sit to/from stand (c LRAD & AE PRN) Pt Will Transfer to Toilet: with supervision;ambulating;regular height toilet (c LRAD PRN)  OT Frequency: Min 1X/week   Barriers to D/C: Inaccessible home environment;Decreased caregiver support          Co-evaluation              AM-PAC OT "6 Clicks" Daily Activity     Outcome Measure Help from another person eating meals?: None Help from another person taking care of personal grooming?: A Little Help from another person toileting, which includes using toliet, bedpan, or urinal?: A Little Help from another person bathing (including washing, rinsing, drying)?: A Lot Help from another person to put on and taking off regular upper body clothing?: A Little Help from another person to put on and taking off regular lower body clothing?: A Lot 6 Click Score: 17   End of Session    Activity Tolerance: Patient limited by pain Patient left: in bed;with call bell/phone within reach  OT Visit Diagnosis: Unsteadiness on feet (R26.81);Other abnormalities of gait and mobility (R26.89)  Time: 1836-7255 OT Time Calculation (min): 14 min Charges:  OT General Charges $OT Visit: 1 Visit OT Evaluation $OT Eval Low Complexity: 1 Low OT Treatments $Self Care/Home Management : 8-22 mins  Dessie Coma, M.S. OTR/L  07/10/19, 4:26 PM

## 2019-07-10 NOTE — Plan of Care (Signed)
Problem: Elimination: °Goal: Will not experience complications related to bowel motility °Outcome: Completed/Met °  °

## 2019-07-10 NOTE — Evaluation (Signed)
Physical Therapy Evaluation Patient Details Name: Lisa Mills MRN: [id number] DOB: September 07, 1929 Today's Date: 07/10/2019   History of Present Illness  Pt admitted for constipation following narcotic use after last admission. History includes DM, CKD, anemia, HTN, and GERD. Previous admission included sacroplasty (07/07/19) following fall.  Clinical Impression  Pt is a pleasant 86 year old female who was admitted for constipation. Pt now with +BM while this writer in room and noted urine incontinence, needs diaper for mobility efforts.  Pt performs bed mobility with cga, transfers with supervision, and ambulation with cga and RW. Pt demonstrates deficits with strength/mobility/pain. Pt reports pain is tolerable while lying supine, however with sitting/mobility increases to 15/10. Only able to take tylenol at this time per RN. Once pain resolves, anticipate improved mobility. Would benefit from skilled PT to address above deficits and promote optimal return to PLOF. Recommend transition to [place] upon discharge from acute hospitalization.     Follow Up Recommendations Home health PT;Supervision for mobility/OOB    Equipment Recommendations  None recommended by PT    Recommendations for Other Services       Precautions / Restrictions Precautions Precautions: Fall Restrictions Weight Bearing Restrictions: No      Mobility  Bed Mobility Overal bed mobility: Needs Assistance Bed Mobility: Supine to Sit     Supine to sit: Min guard     General bed mobility comments: extra time and effort needed. Once seated at EOB, pain increases to 15/10.  Transfers Overall transfer level: Needs assistance Equipment used: Rolling walker (2 wheeled) Transfers: Sit to/from Stand Sit to Stand: Supervision         General transfer comment: safe technique and able to push from seated surface. Once standing, reports severe pain.   Ambulation/Gait Ambulation/Gait assistance: Min guard Gait  Distance (Feet): 50 Feet Assistive device: Rolling walker (2 wheeled) Gait Pattern/deviations: Step-through pattern;Decreased step length - right;Decreased step length - left;Antalgic     General Gait Details: slow ambulation speed, however demonstrates reciprocal gait pattern. No LOB during turns. Heavy reliance on RW and fatigues quickly  Stairs            Wheelchair Mobility    Modified Rankin (Stroke Patients Only)       Balance Overall balance assessment: Needs assistance Sitting-balance support: Feet supported;No upper extremity supported Sitting balance-Leahy Scale: Good     Standing balance support: Bilateral upper extremity supported Standing balance-Leahy Scale: Good                               Pertinent Vitals/Pain Pain Assessment: 0-10 Pain Score: 10-Worst pain ever Pain Location: sacrum Pain Descriptors / Indicators: Sore Pain Intervention(s): Limited activity within patient's tolerance;Repositioned    Home Living Family/patient expects to be discharged to:: Private residence Living Arrangements: Alone Available Help at Discharge: Family;Available 24 hours/day (for a short time, then family will need to go back to work) Type of Home: House Home Access: Stairs to enter Entrance Stairs-Rails: Right;Left;Can reach both Technical brewer of Steps: [place]: One level [place]: Grab bars - toilet;Grab bars - tub/shower;Walker - 2 wheels Additional Comments: also has 2 wheelchairs however doesn't use at baseline    Prior Function Level of Independence: Independent         Comments: Pt active and Ind with amb without an AD, walks dog multiple times per day, Ind with ADLs, fall secondary to tripping over neighbor's dog, no other falls "  in years". Since last admission, has only been household ambulator     Journalist, newspaper        Extremity/Trunk Assessment   Upper Extremity Assessment Upper Extremity Assessment:  Overall WFL for tasks assessed    Lower Extremity Assessment Lower Extremity Assessment: Generalized weakness (B LE grossly 4/5)       Communication   Communication: No difficulties  Cognition Arousal/Alertness: Awake/alert Behavior During Therapy: WFL for tasks assessed/performed Overall Cognitive Status: Within Functional Limits for tasks assessed                                        General Comments      Exercises Other Exercises Other Exercises: Seated ther-ex performed including B LE LAQ and AP. Attempted hip abd, however limited by pain. 10 reps performed with supervision Other Exercises: Performed rolling to B sides for incontinence episode in bed. Able to roll with CGA. Upon standing, additional incontience episode noted requiring multiple stands for diaper donning and hygiene.    Assessment/Plan    PT Assessment Patient needs continued PT services  PT Problem List Decreased strength;Decreased activity tolerance;Decreased balance;Decreased mobility;Decreased knowledge of use of DME;Pain       PT Treatment Interventions DME instruction;Gait training;Stair training;Functional mobility training;Therapeutic activities;Therapeutic exercise;Balance training;Patient/family education    PT Goals (Current goals can be found in the Care Plan section)  Acute Rehab PT Goals Patient Stated Goal: To walk without pain PT Goal Formulation: With patient Time For Goal Achievement: 07/24/19 Potential to Achieve Goals: Good    Frequency Min 2X/week   Barriers to discharge Decreased caregiver support      Co-evaluation               AM-PAC PT "6 Clicks" Mobility  Outcome Measure Help needed turning from your back to your side while in a flat bed without using bedrails?: A Little Help needed moving from lying on your back to sitting on the side of a flat bed without using bedrails?: A Little Help needed moving to and from a bed to a chair (including a  wheelchair)?: A Little Help needed standing up from a chair using your arms (e.g., wheelchair or bedside chair)?: A Little Help needed to walk in hospital room?: A Little Help needed climbing 3-5 steps with a railing? : A Lot 6 Click Score: 17    End of Session Equipment Utilized During Treatment: Gait belt Activity Tolerance: Patient limited by pain Patient left: in bed;with bed alarm set Nurse Communication: Mobility status PT Visit Diagnosis: Unsteadiness on feet (R26.81);Muscle weakness (generalized) (M62.81);Other abnormalities of gait and mobility (R26.89);Pain Pain - Right/Left:  (bilat) Pain - part of body:  (sacrum)    Time: 3354-5625 PT Time Calculation (min) (ACUTE ONLY): 30 min   Charges:   PT Evaluation $PT Eval Low Complexity: 1 Low PT Treatments $Therapeutic Activity: 8-22 mins        Greggory Stallion, PT, DPT [phone]   Vaishnavi Dalby 07/10/2019, 3:59 PM

## 2019-07-10 NOTE — Progress Notes (Signed)
PT Cancellation Note  Patient Details Name: AVIANA SHEVLIN MRN: [id number] DOB: Feb 09, 1929   Cancelled Treatment:    Reason Eval/Treat Not Completed: Other (comment). Consult received and chart reviewed. Pt just received lunch tray and requested for therapist to re-attempt another time. Will re-attempt later today   Adrielle Polakowski 07/10/2019, 1:16 PM  Greggory Stallion, PT, DPT [phone]

## 2019-07-10 NOTE — H&P (Signed)
[place] [facility] [place] NAME: Lisa Mills    MR#:  [id number]  DATE OF BIRTH:  06/26/1929  DATE OF ADMISSION:  07/09/2019  PRIMARY CARE PHYSICIAN: Olin Hauser, DO   REQUESTING/REFERRING PHYSICIAN: Lavonia Drafts, MD  CHIEF COMPLAINT:  Pain in the bottom  HISTORY OF PRESENT ILLNESS:  Lisa Mills  is a 86 y.o. Caucasian female with a known history of hypertension and GERD as well as a recent fall with pelvic and sacral fracture for which she underwent sacral plasty by Dr. Youlanda Mighty and was discharged last week.  The patient has been having severe constipation with no bowel movements for at least 7 days.  She has tried mag citrate as well as laxatives with no help.  She has been having significant diminished appetite.  She can barely move without significant pain still at the sacral area.  No fever or chills.  No nausea or vomiting or abdominal pain.  No dysuria, oliguria or hematuria or flank pain.  Upon presentation to the emergency room, vital signs were within normal.  Labs were remarkable for mild anemia.  COVID-19 PCR came back negative. 1 view abdomen x-ray showed moderate stool in hepatic flexure and descending colon with no fecal impaction in the rectum.  It showed displaced fractures of the left pubic body and left superior pubic ramus as demonstrated on previous CT scan..  The patient will be admitted to an observation medical bed for further evaluation and management. PAST MEDICAL HISTORY:   Past Medical History:  Diagnosis Date  . Dermatophytosis of nail   . GERD (gastroesophageal reflux disease)   . History of shingles   . Hypertension   . Keratoderma     PAST SURGICAL HISTORY:   Past Surgical History:  Procedure Laterality Date  . BREAST BIOPSY Right 09/30/2017   Affirm bx-calcs ( X clip),benign  . BREAST EXCISIONAL BIOPSY Right    neg  . cataracts    . COLONOSCOPY W/ POLYPECTOMY    . COLONOSCOPY WITH PROPOFOL N/A 02/10/2017    Procedure: COLONOSCOPY WITH PROPOFOL;  Surgeon: Manya Silvas, MD;  Location: [facility] ENDOSCOPY;  Service: Endoscopy;  Laterality: N/A;  . ESOPHAGOGASTRODUODENOSCOPY (EGD) WITH PROPOFOL N/A 02/10/2017   Procedure: ESOPHAGOGASTRODUODENOSCOPY (EGD) WITH PROPOFOL;  Surgeon: Manya Silvas, MD;  Location: [facility] ENDOSCOPY;  Service: Endoscopy;  Laterality: N/A;  . EYE SURGERY     bilateral cataracts  . HAND SURGERY    . KIDNEY STONE SURGERY    . SACROPLASTY N/A 07/06/2019   Procedure: SACROPLASTY;  Surgeon: Hessie Knows, MD;  Location: [facility] ORS;  Service: Orthopedics;  Laterality: N/A;  . TONSILECTOMY, ADENOIDECTOMY, BILATERAL MYRINGOTOMY AND TUBES    . TONSILLECTOMY    . TRIGGER FINGER RELEASE     Left ring finger  . tubercular peritonitis      SOCIAL HISTORY:   Social History   Tobacco Use  . Smoking status: Never Smoker  . Smokeless tobacco: Never Used  Substance Use Topics  . Alcohol use: Not Currently    FAMILY HISTORY:   Family History  Adopted: Yes  Problem Relation Age of Onset  . Breast cancer Neg Hx     DRUG ALLERGIES:   Allergies  Allergen Reactions  . Aspirin Other (See Comments)    Burns stomach  . Conray [Iothalamate] Hives    IV dye conray-400  . Dye Fdc Red [Red Dye] Hives  . Sulfa Antibiotics     Unknown Reaction, not  used in years  . Prednisone     Indigestion     REVIEW OF SYSTEMS:   ROS As per history of present illness. All pertinent systems were reviewed above. Constitutional,  HEENT, cardiovascular, respiratory, GI, GU, musculoskeletal, neuro, psychiatric, endocrine,  integumentary and hematologic systems were reviewed and are otherwise  negative/unremarkable except for positive findings mentioned above in the HPI.   MEDICATIONS AT HOME:   Prior to Admission medications   Medication Sig Start Date End Date Taking? Authorizing Provider  cholecalciferol (VITAMIN D) 1000 units tablet Take 1,000 Units by mouth daily.   Yes [provider]  cyclobenzaprine (FLEXERIL) 5 MG tablet Take 1 tablet (5 mg total) by mouth 3 (three) times daily. 07/07/19  Yes Lorella Nimrod, MD  escitalopram (LEXAPRO) 5 MG tablet TAKE 1 TABLET BY MOUTH ONCE DAILY WITH FOOD Patient taking differently: Take 5 mg by mouth daily.  08/17/18  Yes Karamalegos, Devonne Doughty, DO  glimepiride (AMARYL) 4 MG tablet Take 0.5 tablets (2 mg total) by mouth daily. With meal, either breakfast or dinner. 03/15/19  Yes Karamalegos, Devonne Doughty, DO  levothyroxine (SYNTHROID) 25 MCG tablet TAKE 1 TABLET BY MOUTH ONCE DAILY ON AN EMPTY STOMACH. WAIT 30 MINUTES BEFORE TAKING OTHER MEDS. 03/10/19  Yes Karamalegos, Devonne Doughty, DO  lisinopril (ZESTRIL) 5 MG tablet TAKE 1 TABLET BY MOUTH ONCE DAILY 04/25/19  Yes Karamalegos, Devonne Doughty, DO  omeprazole (PRILOSEC) 20 MG capsule TAKE 1 CAPSULE BY MOUTH ONCE DAILY 02/06/19  Yes Karamalegos, Devonne Doughty, DO  oxyCODONE (OXY IR/ROXICODONE) 5 MG immediate release tablet Take 1 tablet (5 mg total) by mouth every 4 (four) hours as needed for severe pain. 07/07/19  Yes Lorella Nimrod, MD  senna (SENOKOT) 8.6 MG TABS tablet Take 1 tablet (8.6 mg total) by mouth 2 (two) times daily. 07/07/19  Yes Lorella Nimrod, MD  simvastatin (ZOCOR) 40 MG tablet TAKE 1 TABLET BY MOUTH AT BEDTIME 01/19/19  Yes Karamalegos, Devonne Doughty, DO  traMADol (ULTRAM) 50 MG tablet Take 50 mg by mouth every 12 (twelve) hours as needed for moderate pain.   Yes [provider]  Biotin 1000 MCG tablet Take by mouth. Patient not taking: Reported on 07/04/2019    [provider]  Blood Glucose Monitoring Suppl ([place]) w/Device KIT Use glucometer to check blood sugar daily. 02/25/16   Karamalegos, Devonne Doughty, DO  glucose blood (ONE TOUCH ULTRA TEST) test strip CHECK BLOOD SUGAR UP TO 2 TIMES A DAY. 03/07/18   Karamalegos, Devonne Doughty, DO  mupirocin ointment (BACTROBAN) 2 % Apply 1 application topically 2 (two) times daily. For 1-2 weeks Patient  not taking: Reported on 07/04/2019 06/16/19   Olin Hauser, DO  ONETOUCH DELICA LANCETS 93X MISC 1 each by Does not apply route 3 (three) times daily. Dx:E11.9 04/14/16   Olin Hauser, DO  Probiotic Product (PROBIOTIC-10 PO) Take by mouth. Patient not taking: Reported on 07/04/2019    [provider]      VITAL SIGNS:  Blood pressure (!) 154/56, pulse 74, temperature 98.7 F (37.1 C), temperature source Oral, resp. rate 18, height 4' 11" (1.499 m), weight 57 kg, SpO2 95 %.  PHYSICAL EXAMINATION:  Physical Exam  GENERAL:  86 y.o.-year-old Caucasian female patient lying in the bed with no acute distress.  EYES: Pupils equal, round, reactive to light and accommodation. No scleral icterus. Extraocular muscles intact.  HEENT: Head atraumatic, normocephalic. Oropharynx and nasopharynx clear.  NECK:  Supple, no jugular  venous distention. No thyroid enlargement, no tenderness.  LUNGS: Normal breath sounds bilaterally, no wheezing, rales,rhonchi or crepitation. No use of accessory muscles of respiration.  CARDIOVASCULAR: Regular rate and rhythm, S1, S2 normal. No murmurs, rubs, or gallops.  ABDOMEN: Soft, nondistended, nontender. Bowel sounds present. No organomegaly or mass.  EXTREMITIES: No pedal edema, cyanosis, or clubbing.  NEUROLOGIC: Cranial nerves II through XII are intact. Muscle strength 5/5 in all extremities. Sensation intact. Gait not checked. Musculoskeletal: She had sacral tenderness PSYCHIATRIC: The patient is alert and oriented x 3.  Normal affect and good eye contact. SKIN: No obvious rash, lesion, or ulcer.   LABORATORY PANEL:   CBC Recent Labs  Lab 07/09/19 1432  WBC 12.5*  HGB 11.2*  HCT 32.3*  PLT 387   ------------------------------------------------------------------------------------------------------------------  Chemistries  Recent Labs  Lab 07/09/19 1432  NA 130*  K 4.4  CL 96*  CO2 21*  GLUCOSE 116*  BUN 15  CREATININE  1.09*  CALCIUM 10.0  AST 25  ALT 13  ALKPHOS 88  BILITOT 0.8   ------------------------------------------------------------------------------------------------------------------  Cardiac Enzymes No results for input(s): TROPONINI in the last 168 hours. ------------------------------------------------------------------------------------------------------------------  RADIOLOGY:  DG Abdomen 1 View  Result Date: 07/09/2019 CLINICAL DATA:  Constipation. EXAM: ABDOMEN - 1 VIEW COMPARISON:  Radiograph dated 03/16/2016 and CT scan of the pelvis dated 07/04/2019 FINDINGS: There are no dilated loops of large or small bowel. There is moderate stool in the hepatic flexure and in the splenic flexure and descending colon but there is no fecal impaction in the rectum. There is a displaced fracture of the left pubic body and left superior pubic ramus as demonstrated on the prior CT scan of 07/04/2019. The patient has undergone bilateral sacroplasty for the bilateral sacral ala fractures demonstrated on that CT scan. No acute bone abnormality. IMPRESSION: 1. Benign-appearing abdomen. Moderate stool in the hepatic flexure and descending colon. No fecal impaction in the rectum. 2. Displaced fractures of the left pubic body and left superior pubic ramus as demonstrated on the prior CT scan. Electronically Signed   By: Lorriane Shire M.D.   On: 07/09/2019 17:42      IMPRESSION AND PLAN:   1.  Severe constipation.  This is most likely secondary to narcotic therapy perioperatively and postoperatively status post sacral plasty. -Patient will be admitted toan observation medical bed. -We will continue p.o. laxatives. -We will place the patient on naloxegol and PEG for bowel regimen. -A GI consultation will be obtained for further assessment for need for decompression. -I notified Dr. Alice Reichert about the patient.  2.  Sacroplasty with persistent pain. -Pain management will be provided. -Orthopedic follow-up  consultation will be obtained. -I notified Dr. Marry Guan and he will notify Dr. Rudene Christians in a.m.  3.  Type 2 diabetes mellitus. -We will continue Amaryl and place the patient on supplemental coverage with NovoLog.  4.  Dyslipidemia. -Statin therapy will be resumed.  5.  Depression. -Lexapro will be resumed.  6.  DVT prophylaxis. -Subcutaneous Lovenox.  All the records are reviewed and case discussed with ED provider. The plan of care was discussed in details with the patient (and family). I answered all questions. The patient agreed to proceed with the above mentioned plan. Further management will depend upon hospital course.   CODE STATUS: Full code  Status is: Observation  The patient remains OBS appropriate and will d/c before 2 midnights.  Dispo: The patient is from: Home  Anticipated d/c is to: Home              Anticipated d/c date is: 1 day              Patient currently is medically stable to d/c.   TOTAL TIME TAKING CARE OF THIS PATIENT: 55 minutes.    Christel Mormon M.D on 07/10/2019 at 4:04 AM  [facility]   From 7 PM-7 AM, contact night-coverage [email or web address]  CC: Primary care physician; Olin Hauser, DO   Note: This dictation was prepared with Dragon dictation along with smaller phrase technology. Any transcriptional typo errors that result from this process are unintentional.

## 2019-07-10 NOTE — Progress Notes (Signed)
PROGRESS NOTE    Lisa Mills  IOE:[id number] DOB: [birth year] DOA: 07/09/2019 PCP: Olin Hauser, DO   Assessment & Plan:   Active Problems:   Constipation  Severe constipation: likely secondary to narcotic use from recent sacroplasty. Continue on movantik, miralax, sorbitol, & fleet enema   Hx of recent sacroplasty: with persistent pain. Morphine prn   DM2: continue on SSI w/ accuchecks. Carb modified diet   Dyslipidemia: continue on stsatin   Depression: severity unknown. Continue on lexapro  Hypothyroidism: continue on levothyroxine   HTN: continue on lisinopril   Normocytic anemia: no need for a transfusion at this time. Will continue to monitor   DVT prophylaxis: lovenox Code Status: full  Family Communication:  Disposition Plan: likely d/c back home. Unlikely any barriers   Consultants:      Procedures:   Antimicrobials:   Subjective: Pt c/o abd pain  Objective: Vitals:   07/09/19 2130 07/09/19 2230 07/10/19 0017 07/10/19 0630  BP: 138/66 132/60 (!) 154/56 (!) 166/87  Pulse: 75 76 74 88  Resp:  18 18 20   Temp:   98.7 F (37.1 C) 97.9 F (36.6 C)  TempSrc:   Oral Oral  SpO2: 95% 91% 95% 94%  Weight:      Height:        Intake/Output Summary (Last 24 hours) at 07/10/2019 0730 Last data filed at 07/10/2019 0025 Gross per 24 hour  Intake 120 ml  Output 120 ml  Net 0 ml   Filed Weights   07/09/19 1431  Weight: 57 kg    Examination:  General exam: Appears calm and comfortable  Respiratory system: Clear to auscultation. Respiratory effort normal. Cardiovascular system: S1 & S2+. No, rubs, gallops or clicks.  Gastrointestinal system: Abdomen is nondistended, soft and tenderness to palpation. Hypoactive bowel sounds heard. Central nervous system: Alert and oriented. Moves all 4 extremities  Psychiatry: Judgement and insight appear normal. Flat mood and affect.     Data Reviewed: I have personally reviewed following labs  and imaging studies  CBC: Recent Labs  Lab 07/04/19 1220 07/04/19 1220 07/05/19 0444 07/06/19 0526 07/07/19 0516 07/09/19 1432 07/10/19 0403  WBC 13.8*   < > 8.8 8.3 8.2 12.5* 9.2  NEUTROABS 10.6*  --   --   --   --  9.7*  --   HGB 10.5*   < > 10.0* 8.9* 8.7* 11.2* 9.5*  HCT 31.5*   < > 28.7* 25.4* 26.1* 32.3* 27.4*  MCV 92.6   < > 90.3 90.7 93.9 90.0 88.7  PLT 419*   < > 386 347 341 387 357   < > = values in this interval not displayed.   Basic Metabolic Panel: Recent Labs  Lab 07/05/19 0444 07/06/19 0526 07/07/19 0516 07/09/19 1432 07/10/19 0403  NA 131* 133* 135 130* 133*  K 4.8 4.7 4.4 4.4 3.9  CL 99 104 105 96* 98  CO2 25 22 24  21* 29  GLUCOSE 130* 147* 132* 116* 53*  BUN 30* 21 18 15 14   CREATININE 1.33* 1.13* 1.19* 1.09* 1.08*  CALCIUM 8.6* 8.6* 8.5* 10.0 9.1   GFR: Estimated Creatinine Clearance: 28.2 mL/min (A) (by C-G formula based on SCr of 1.08 mg/dL (H)). Liver Function Tests: Recent Labs  Lab 07/09/19 1432  AST 25  ALT 13  ALKPHOS 88  BILITOT 0.8  PROT 7.2  ALBUMIN 4.0   No results for input(s): LIPASE, AMYLASE in the last 168 hours. No results for input(s): AMMONIA in the  last 168 hours. Coagulation Profile: No results for input(s): INR, PROTIME in the last 168 hours. Cardiac Enzymes: No results for input(s): CKTOTAL, CKMB, CKMBINDEX, TROPONINI in the last 168 hours. BNP (last 3 results) No results for input(s): PROBNP in the last 8760 hours. HbA1C: No results for input(s): HGBA1C in the last 72 hours. CBG: Recent Labs  Lab 07/06/19 0945 07/06/19 1524 07/06/19 1655 07/06/19 2109 07/07/19 0744  GLUCAP 108* 101* 118* 192* 146*   Lipid Profile: No results for input(s): CHOL, HDL, LDLCALC, TRIG, CHOLHDL, LDLDIRECT in the last 72 hours. Thyroid Function Tests: No results for input(s): TSH, T4TOTAL, FREET4, T3FREE, THYROIDAB in the last 72 hours. Anemia Panel: No results for input(s): VITAMINB12, FOLATE, FERRITIN, TIBC, IRON,  RETICCTPCT in the last 72 hours. Sepsis Labs: No results for input(s): PROCALCITON, LATICACIDVEN in the last 168 hours.  Recent Results (from the past 240 hour(s))  SARS Coronavirus 2 by RT PCR (hospital order, performed in [facility] hospital lab) Nasopharyngeal Nasopharyngeal Swab     Status: None   Collection Time: 07/04/19 12:58 PM   Specimen: Nasopharyngeal Swab  Result Value Ref Range Status   SARS Coronavirus 2 NEGATIVE NEGATIVE Final    Comment: (NOTE) SARS-CoV-2 target nucleic acids are NOT DETECTED.  The SARS-CoV-2 RNA is generally detectable in upper and lower respiratory specimens during the acute phase of infection. The lowest concentration of SARS-CoV-2 viral copies this assay can detect is 250 copies / mL. A negative result does not preclude SARS-CoV-2 infection and should not be used as the sole basis for treatment or other patient management decisions.  A negative result may occur with improper specimen collection / handling, submission of specimen other than nasopharyngeal swab, presence of viral mutation(s) within the areas targeted by this assay, and inadequate number of viral copies (<250 copies / mL). A negative result must be combined with clinical observations, patient history, and epidemiological information.  Fact Sheet for Patients:   StrictlyIdeas.no  Fact Sheet for Healthcare Providers: BankingDealers.co.za  This test is not yet approved or  cleared by the [place] FDA and has been authorized for detection and/or diagnosis of SARS-CoV-2 by FDA under an Emergency Use Authorization (EUA).  This EUA will remain in effect (meaning this test can be used) for the duration of the COVID-19 declaration under Section 564(b)(1) of the Act, 21 U.S.C. section 360bbb-3(b)(1), unless the authorization is terminated or revoked sooner.  Performed at [facility], [address], [place], [place]  [address]   Urine Culture     Status: Abnormal   Collection Time: 07/04/19  3:55 PM   Specimen: Urine, Random  Result Value Ref Range Status   Specimen Description   Final    URINE, RANDOM Performed at [facility], [address], [place], [place] [address]    Special Requests   Final    NONE Performed at [facility], [address] [place], [place] [address]    Culture MULTIPLE SPECIES PRESENT, SUGGEST RECOLLECTION (A)  Final   Report Status 07/06/2019 FINAL  Final  Urine Culture     Status: None   Collection Time: 07/06/19  6:16 PM   Specimen: Urine, Random  Result Value Ref Range Status   Specimen Description   Final    URINE, RANDOM Performed at [facility], [address] [place], [place] [address]    Special Requests   Final    NONE Performed at [facility], [place], [place]  [address]    Culture   Final    NO GROWTH Performed at [facility], [place] [address] [place], [place] [address]    Report Status 07/09/2019 FINAL  Final  SARS Coronavirus 2 by RT PCR (hospital order, performed in [facility] hospital lab) Nasopharyngeal Nasopharyngeal Swab     Status: None   Collection Time: 07/09/19 10:46 PM   Specimen: Nasopharyngeal Swab  Result Value Ref Range Status   SARS Coronavirus 2 NEGATIVE NEGATIVE Final    Comment: (NOTE) SARS-CoV-2 target nucleic acids are NOT DETECTED.  The SARS-CoV-2 RNA is generally detectable in upper and lower respiratory specimens during the acute phase of infection. The lowest concentration of SARS-CoV-2 viral copies this assay can detect is 250 copies / mL. A negative result does not preclude SARS-CoV-2 infection and should not be used as the sole basis for treatment or other patient management decisions.  A negative result may occur with improper specimen collection / handling, submission of specimen other than nasopharyngeal swab, presence of viral mutation(s) within  the areas targeted by this assay, and inadequate number of viral copies (<250 copies / mL). A negative result must be combined with clinical observations, patient history, and epidemiological information.  Fact Sheet for Patients:   StrictlyIdeas.no  Fact Sheet for Healthcare Providers: BankingDealers.co.za  This test is not yet approved or  cleared by the [place] FDA and has been authorized for detection and/or diagnosis of SARS-CoV-2 by FDA under an Emergency Use Authorization (EUA).  This EUA will remain in effect (meaning this test can be used) for the duration of the COVID-19 declaration under Section 564(b)(1) of the Act, 21 U.S.C. section 360bbb-3(b)(1), unless the authorization is terminated or revoked sooner.  Performed at [facility], [address], [place],  [address]          Radiology Studies: DG Abdomen 1 View  Result Date: 07/09/2019 CLINICAL DATA:  Constipation. EXAM: ABDOMEN - 1 VIEW COMPARISON:  Radiograph dated 03/16/2016 and CT scan of the pelvis dated 07/04/2019 FINDINGS: There are no dilated loops of large or small bowel. There is moderate stool in the hepatic flexure and in the splenic flexure and descending colon but there is no fecal impaction in the rectum. There is a displaced fracture of the left pubic body and left superior pubic ramus as demonstrated on the prior CT scan of 07/04/2019. The patient has undergone bilateral sacroplasty for the bilateral sacral ala fractures demonstrated on that CT scan. No acute bone abnormality. IMPRESSION: 1. Benign-appearing abdomen. Moderate stool in the hepatic flexure and descending colon. No fecal impaction in the rectum. 2. Displaced fractures of the left pubic body and left superior pubic ramus as demonstrated on the prior CT scan. Electronically Signed   By: Lorriane Shire M.D.   On: 07/09/2019 17:42        Scheduled Meds: .  cholecalciferol  1,000 Units Oral Daily  . docusate sodium  100 mg Oral BID  . enoxaparin (LOVENOX) injection  30 mg Subcutaneous Q24H  . escitalopram  5 mg Oral Daily  . insulin aspart  0-9 Units Subcutaneous TID PC & HS  . levothyroxine  25 mcg Oral Q0600  . lisinopril  5 mg Oral Daily  . naloxegol oxalate  12.5 mg Oral Daily  . pantoprazole  40 mg Oral Daily  . polyethylene glycol  17 g Oral BID  . simvastatin  40 mg Oral QHS   Continuous Infusions: . sodium chloride 100 mL/hr  at 07/10/19 0124     LOS: 0 days    Time spent: 33 mins     Wyvonnia Dusky, MD [facility] Pager [phone]  If 7PM-7AM, please contact night-coverage [email or web address] 07/10/2019, 7:30 AM

## 2019-07-11 DIAGNOSIS — M5418 Radiculopathy, sacral and sacrococcygeal region: Secondary | ICD-10-CM | POA: Diagnosis present

## 2019-07-11 DIAGNOSIS — R350 Frequency of micturition: Secondary | ICD-10-CM

## 2019-07-11 DIAGNOSIS — I129 Hypertensive chronic kidney disease with stage 1 through stage 4 chronic kidney disease, or unspecified chronic kidney disease: Secondary | ICD-10-CM | POA: Diagnosis present

## 2019-07-11 DIAGNOSIS — R102 Pelvic and perineal pain: Secondary | ICD-10-CM | POA: Diagnosis not present

## 2019-07-11 DIAGNOSIS — Z20822 Contact with and (suspected) exposure to covid-19: Secondary | ICD-10-CM | POA: Diagnosis present

## 2019-07-11 DIAGNOSIS — F329 Major depressive disorder, single episode, unspecified: Secondary | ICD-10-CM | POA: Diagnosis present

## 2019-07-11 DIAGNOSIS — K219 Gastro-esophageal reflux disease without esophagitis: Secondary | ICD-10-CM | POA: Diagnosis present

## 2019-07-11 DIAGNOSIS — W19XXXD Unspecified fall, subsequent encounter: Secondary | ICD-10-CM | POA: Diagnosis present

## 2019-07-11 DIAGNOSIS — N1831 Chronic kidney disease, stage 3a: Secondary | ICD-10-CM | POA: Diagnosis present

## 2019-07-11 DIAGNOSIS — T40605A Adverse effect of unspecified narcotics, initial encounter: Secondary | ICD-10-CM | POA: Diagnosis present

## 2019-07-11 DIAGNOSIS — S32592D Other specified fracture of left pubis, subsequent encounter for fracture with routine healing: Secondary | ICD-10-CM | POA: Diagnosis not present

## 2019-07-11 DIAGNOSIS — E871 Hypo-osmolality and hyponatremia: Secondary | ICD-10-CM | POA: Diagnosis present

## 2019-07-11 DIAGNOSIS — Z9889 Other specified postprocedural states: Secondary | ICD-10-CM | POA: Diagnosis not present

## 2019-07-11 DIAGNOSIS — Y92009 Unspecified place in unspecified non-institutional (private) residence as the place of occurrence of the external cause: Secondary | ICD-10-CM | POA: Diagnosis not present

## 2019-07-11 DIAGNOSIS — D631 Anemia in chronic kidney disease: Secondary | ICD-10-CM | POA: Diagnosis present

## 2019-07-11 DIAGNOSIS — G8918 Other acute postprocedural pain: Secondary | ICD-10-CM | POA: Diagnosis present

## 2019-07-11 DIAGNOSIS — E039 Hypothyroidism, unspecified: Secondary | ICD-10-CM | POA: Diagnosis present

## 2019-07-11 DIAGNOSIS — E1122 Type 2 diabetes mellitus with diabetic chronic kidney disease: Secondary | ICD-10-CM | POA: Diagnosis present

## 2019-07-11 DIAGNOSIS — Z8619 Personal history of other infectious and parasitic diseases: Secondary | ICD-10-CM | POA: Diagnosis not present

## 2019-07-11 DIAGNOSIS — B9620 Unspecified Escherichia coli [E. coli] as the cause of diseases classified elsewhere: Secondary | ICD-10-CM | POA: Diagnosis present

## 2019-07-11 DIAGNOSIS — E785 Hyperlipidemia, unspecified: Secondary | ICD-10-CM | POA: Diagnosis present

## 2019-07-11 DIAGNOSIS — K5903 Drug induced constipation: Secondary | ICD-10-CM | POA: Diagnosis present

## 2019-07-11 LAB — GLUCOSE, CAPILLARY
Glucose-Capillary: 44 mg/dL — CL (ref 70–99)
Glucose-Capillary: 98 mg/dL (ref 70–99)
Glucose-Capillary: 98 mg/dL (ref 70–99)
Glucose-Capillary: 179 mg/dL — ABNORMAL HIGH (ref 70–99)
Glucose-Capillary: 153 mg/dL — ABNORMAL HIGH (ref 70–99)

## 2019-07-11 LAB — URINALYSIS, COMPLETE (UACMP) WITH MICROSCOPIC
Bilirubin Urine: NEGATIVE
Glucose, UA: NEGATIVE mg/dL
Hgb urine dipstick: NEGATIVE
Ketones, ur: NEGATIVE mg/dL
Leukocytes,Ua: NEGATIVE
Nitrite: NEGATIVE
Protein, ur: NEGATIVE mg/dL
Specific Gravity, Urine: 1.005 (ref 1.005–1.030)
WBC, UA: NONE SEEN WBC/hpf (ref 0–5)
pH: 8 (ref 5.0–8.0)

## 2019-07-11 LAB — BASIC METABOLIC PANEL
Anion gap: 7 (ref 5–15)
BUN: 12 mg/dL (ref 8–23)
CO2: 26 mmol/L (ref 22–32)
Calcium: 8.5 mg/dL — ABNORMAL LOW (ref 8.9–10.3)
Chloride: 99 mmol/L (ref 98–111)
Creatinine, Ser: 1.1 mg/dL — ABNORMAL HIGH (ref 0.44–1.00)
GFR calc Af Amer: 52 mL/min — ABNORMAL LOW (ref >60)
GFR calc non Af Amer: 45 mL/min — ABNORMAL LOW (ref >60)
Glucose, Bld: 99 mg/dL (ref 70–99)
Potassium: 3.9 mmol/L (ref 3.5–5.1)
Sodium: 132 mmol/L — ABNORMAL LOW (ref 135–145)

## 2019-07-11 LAB — CBC
HCT: 28.9 % — ABNORMAL LOW (ref 36.0–46.0)
Hemoglobin: 9.7 g/dL — ABNORMAL LOW (ref 12.0–15.0)
MCH: 31 pg (ref 26.0–34.0)
MCHC: 33.6 g/dL (ref 30.0–36.0)
MCV: 92.3 fL (ref 80.0–100.0)
Platelets: 362 10*3/uL (ref 150–400)
RBC: 3.13 MIL/uL — ABNORMAL LOW (ref 3.87–5.11)
RDW: 12.7 % (ref 11.5–15.5)
WBC: 9.3 10*3/uL (ref 4.0–10.5)
nRBC: 0 % (ref 0.0–0.2)

## 2019-07-11 LAB — HEMOGLOBIN A1C
Hgb A1c MFr Bld: 7.2 % — ABNORMAL HIGH (ref 4.8–5.6)
Mean Plasma Glucose: 160 mg/dL

## 2019-07-11 MED ORDER — ENSURE ENLIVE PO LIQD
237.0000 mL | Freq: Three times a day (TID) | ORAL | Status: DC
  Administered 2019-07-12 – 2019-07-13 (×3): 237 mL via ORAL

## 2019-07-11 NOTE — Progress Notes (Signed)
Inpatient Diabetes Program Recommendations  AACE/ADA: New Consensus Statement on Inpatient Glycemic Control (2015)  Target Ranges:  Prepandial:   less than 140 mg/dL      Peak postprandial:   less than 180 mg/dL (1-2 hours)      Critically ill patients:  140 - 180 mg/dL   Lab Results  Component Value Date   GLUCAP 98 07/11/2019   HGBA1C 7.2 (H) 07/10/2019    Review of Glycemic Control Results for LAVRA, IMLER (MRN [id number]) as of 07/11/2019 11:28  Ref. Range 07/10/2019 12:02 07/10/2019 13:04 07/10/2019 16:51 07/10/2019 22:30 07/11/2019 08:19  Glucose-Capillary Latest Ref Range: 70 - 99 mg/dL 41 (LL) 100 (H) 132 (H) 109 (H) 98  Home DM Meds: Amaryl 2 mg Daily  Current Orders: Novolog Sensitive Correction Scale/ SSI (0-9 units) TID AC + HS   May consider discontinuation of Novolog SSI for now but continue CBG checks  Thanks,  Adah Perl, RN, BC-ADM Inpatient Diabetes Coordinator Pager [phone] (8a-5p)

## 2019-07-11 NOTE — Progress Notes (Signed)
PROGRESS NOTE    Lisa Mills  JJO:[id number] DOB: [birth year] DOA: 07/09/2019 PCP: Olin Hauser, DO   Assessment & Plan:   Active Problems:   Constipation  Severe constipation: likely secondary to narcotic use from recent sacroplasty. Continue on movantik, miralax, colace. Had a bowel movement 07/11/19  Hx of recent sacroplasty: with persistent significant pain. Morphine prn   Urinary frequency: r/o UTI, so UA & urine cx ordered.  DM2: continue on SSI w/ accuchecks. Carb modified diet   Dyslipidemia: continue on stsatin   Depression: severity unknown. Continue on lexapro  Hypothyroidism: continue on levothyroxine   HTN: continue on lisinopril   Normocytic anemia: no need for a transfusion at this time. Will continue to monitor   DVT prophylaxis: lovenox Code Status: full  Family Communication:  Disposition Plan: likely d/c back home. Unlikely any barriers   Consultants:      Procedures:   Antimicrobials:   Subjective: Pt c/o bladder pain, increased urination and sacrum pain   Objective: Vitals:   07/10/19 0733 07/10/19 1550 07/10/19 2354 07/11/19 0730  BP: (!) 167/77 (!) 160/85 (!) 167/66 (!) 185/80  Pulse: 82 76 81 76  Resp: 18 18 17 16   Temp: 98.2 F (36.8 C) 98.7 F (37.1 C) 98.2 F (36.8 C) 97.9 F (36.6 C)  TempSrc: Oral Oral Oral Oral  SpO2: 98% 97% 96% 97%  Weight:      Height:        Intake/Output Summary (Last 24 hours) at 07/11/2019 1327 Last data filed at 07/11/2019 0932 Gross per 24 hour  Intake 240 ml  Output 2050 ml  Net -1810 ml   Filed Weights   07/09/19 1431  Weight: 57 kg    Examination:  General exam: Appears calm but uncomfortable  Respiratory system: Clear to auscultation. Respiratory effort normal. Cardiovascular system: S1 & S2+. No, rubs, gallops or clicks.  Gastrointestinal system: Abdomen is nondistended, soft and tenderness to palpation. Hypoactive bowel sounds heard. Central nervous system:  Alert and oriented. Moves all 4 extremities  Psychiatry: Judgement and insight appear normal. Flat mood and affect.     Data Reviewed: I have personally reviewed following labs and imaging studies  CBC: Recent Labs  Lab 07/06/19 0526 07/07/19 0516 07/09/19 1432 07/10/19 0403 07/11/19 0432  WBC 8.3 8.2 12.5* 9.2 9.3  NEUTROABS  --   --  9.7*  --   --   HGB 8.9* 8.7* 11.2* 9.5* 9.7*  HCT 25.4* 26.1* 32.3* 27.4* 28.9*  MCV 90.7 93.9 90.0 88.7 92.3  PLT 347 341 387 357 160   Basic Metabolic Panel: Recent Labs  Lab 07/06/19 0526 07/07/19 0516 07/09/19 1432 07/10/19 0403 07/11/19 0432  NA 133* 135 130* 133* 132*  K 4.7 4.4 4.4 3.9 3.9  CL 104 105 96* 98 99  CO2 22 24 21* 29 26  GLUCOSE 147* 132* 116* 53* 99  BUN 21 18 15 14 12   CREATININE 1.13* 1.19* 1.09* 1.08* 1.10*  CALCIUM 8.6* 8.5* 10.0 9.1 8.5*   GFR: Estimated Creatinine Clearance: 27.7 mL/min (A) (by C-G formula based on SCr of 1.1 mg/dL (H)). Liver Function Tests: Recent Labs  Lab 07/09/19 1432  AST 25  ALT 13  ALKPHOS 88  BILITOT 0.8  PROT 7.2  ALBUMIN 4.0   No results for input(s): LIPASE, AMYLASE in the last 168 hours. No results for input(s): AMMONIA in the last 168 hours. Coagulation Profile: No results for input(s): INR, PROTIME in the last 168 hours. Cardiac  Enzymes: No results for input(s): CKTOTAL, CKMB, CKMBINDEX, TROPONINI in the last 168 hours. BNP (last 3 results) No results for input(s): PROBNP in the last 8760 hours. HbA1C: Recent Labs    07/10/19 0403  HGBA1C 7.2*   CBG: Recent Labs  Lab 07/10/19 1304 07/10/19 1651 07/10/19 2230 07/11/19 0819 07/11/19 1143  GLUCAP 100* 132* 109* 98 98   Lipid Profile: No results for input(s): CHOL, HDL, LDLCALC, TRIG, CHOLHDL, LDLDIRECT in the last 72 hours. Thyroid Function Tests: No results for input(s): TSH, T4TOTAL, FREET4, T3FREE, THYROIDAB in the last 72 hours. Anemia Panel: No results for input(s): VITAMINB12, FOLATE, FERRITIN,  TIBC, IRON, RETICCTPCT in the last 72 hours. Sepsis Labs: No results for input(s): PROCALCITON, LATICACIDVEN in the last 168 hours.  Recent Results (from the past 240 hour(s))  SARS Coronavirus 2 by RT PCR (hospital order, performed in [facility] hospital lab) Nasopharyngeal Nasopharyngeal Swab     Status: None   Collection Time: 07/04/19 12:58 PM   Specimen: Nasopharyngeal Swab  Result Value Ref Range Status   SARS Coronavirus 2 NEGATIVE NEGATIVE Final    Comment: (NOTE) SARS-CoV-2 target nucleic acids are NOT DETECTED.  The SARS-CoV-2 RNA is generally detectable in upper and lower respiratory specimens during the acute phase of infection. The lowest concentration of SARS-CoV-2 viral copies this assay can detect is 250 copies / mL. A negative result does not preclude SARS-CoV-2 infection and should not be used as the sole basis for treatment or other patient management decisions.  A negative result may occur with improper specimen collection / handling, submission of specimen other than nasopharyngeal swab, presence of viral mutation(s) within the areas targeted by this assay, and inadequate number of viral copies (<250 copies / mL). A negative result must be combined with clinical observations, patient history, and epidemiological information.  Fact Sheet for Patients:   StrictlyIdeas.no  Fact Sheet for Healthcare Providers: BankingDealers.co.za  This test is not yet approved or  cleared by the [place] FDA and has been authorized for detection and/or diagnosis of SARS-CoV-2 by FDA under an Emergency Use Authorization (EUA).  This EUA will remain in effect (meaning this test can be used) for the duration of the COVID-19 declaration under Section 564(b)(1) of the Act, 21 U.S.C. section 360bbb-3(b)(1), unless the authorization is terminated or revoked sooner.  Performed at [facility], [address]  [place], [place] [address]   Urine Culture     Status: Abnormal   Collection Time: 07/04/19  3:55 PM   Specimen: Urine, Random  Result Value Ref Range Status   Specimen Description   Final    URINE, RANDOM Performed at [facility], [address], [place], [place] [address]    Special Requests   Final    NONE Performed at [facility], [address] [place], [place] [address]    Culture MULTIPLE SPECIES PRESENT, SUGGEST RECOLLECTION (A)  Final   Report Status 07/06/2019 FINAL  Final  Urine Culture     Status: None   Collection Time: 07/06/19  6:16 PM   Specimen: Urine, Random  Result Value Ref Range Status   Specimen Description   Final    URINE, RANDOM Performed at [facility], [address], [place], [place] [address]    Special Requests   Final    NONE Performed at [facility], [address] [place], [place] [address]    Culture   Final    NO GROWTH Performed at  [facility], [place] [address] [place], [place] [address]    Report Status 07/09/2019 FINAL  Final  SARS Coronavirus 2 by RT PCR (hospital order, performed in [facility] hospital lab) Nasopharyngeal Nasopharyngeal Swab     Status: None   Collection Time: 07/09/19 10:46 PM   Specimen: Nasopharyngeal Swab  Result Value Ref Range Status   SARS Coronavirus 2 NEGATIVE NEGATIVE Final    Comment: (NOTE) SARS-CoV-2 target nucleic acids are NOT DETECTED.  The SARS-CoV-2 RNA is generally detectable in upper and lower respiratory specimens during the acute phase of infection. The lowest concentration of SARS-CoV-2 viral copies this assay can detect is 250 copies / mL. A negative result does not preclude SARS-CoV-2 infection and should not be used as the sole basis for treatment or other patient management decisions.  A negative result may occur with improper specimen collection / handling, submission of specimen other than nasopharyngeal swab, presence of viral  mutation(s) within the areas targeted by this assay, and inadequate number of viral copies (<250 copies / mL). A negative result must be combined with clinical observations, patient history, and epidemiological information.  Fact Sheet for Patients:   StrictlyIdeas.no  Fact Sheet for Healthcare Providers: BankingDealers.co.za  This test is not yet approved or  cleared by the [place] FDA and has been authorized for detection and/or diagnosis of SARS-CoV-2 by FDA under an Emergency Use Authorization (EUA).  This EUA will remain in effect (meaning this test can be used) for the duration of the COVID-19 declaration under Section 564(b)(1) of the Act, 21 U.S.C. section 360bbb-3(b)(1), unless the authorization is terminated or revoked sooner.  Performed at [facility], [address], [place], [place] [address]          Radiology Studies: DG Abdomen 1 View  Result Date: 07/09/2019 CLINICAL DATA:  Constipation. EXAM: ABDOMEN - 1 VIEW COMPARISON:  Radiograph dated 03/16/2016 and CT scan of the pelvis dated 07/04/2019 FINDINGS: There are no dilated loops of large or small bowel. There is moderate stool in the hepatic flexure and in the splenic flexure and descending colon but there is no fecal impaction in the rectum. There is a displaced fracture of the left pubic body and left superior pubic ramus as demonstrated on the prior CT scan of 07/04/2019. The patient has undergone bilateral sacroplasty for the bilateral sacral ala fractures demonstrated on that CT scan. No acute bone abnormality. IMPRESSION: 1. Benign-appearing abdomen. Moderate stool in the hepatic flexure and descending colon. No fecal impaction in the rectum. 2. Displaced fractures of the left pubic body and left superior pubic ramus as demonstrated on the prior CT scan. Electronically Signed   By: Lorriane Shire M.D.   On: 07/09/2019 17:42        Scheduled  Meds: . cholecalciferol  1,000 Units Oral Daily  . docusate sodium  100 mg Oral BID  . enoxaparin (LOVENOX) injection  30 mg Subcutaneous Q24H  . escitalopram  5 mg Oral Daily  . feeding supplement (ENSURE ENLIVE)  237 mL Oral TID  . insulin aspart  0-9 Units Subcutaneous TID PC & HS  . levothyroxine  25 mcg Oral Q0600  . lisinopril  40 mg Oral Daily  . naloxegol oxalate  12.5 mg Oral Daily  . pantoprazole  40 mg Oral Daily  . simvastatin  40 mg Oral QHS   Continuous Infusions:    LOS: 0 days    Time spent: 35 mins     Junie Spencer  Carmelia Roller, MD [facility] Pager [phone]  If 7PM-7AM, please contact night-coverage [email or web address] 07/11/2019, 1:27 PM

## 2019-07-12 DIAGNOSIS — N1831 Chronic kidney disease, stage 3a: Secondary | ICD-10-CM

## 2019-07-12 DIAGNOSIS — D649 Anemia, unspecified: Secondary | ICD-10-CM

## 2019-07-12 DIAGNOSIS — E871 Hypo-osmolality and hyponatremia: Secondary | ICD-10-CM

## 2019-07-12 DIAGNOSIS — M5418 Radiculopathy, sacral and sacrococcygeal region: Secondary | ICD-10-CM

## 2019-07-12 DIAGNOSIS — E039 Hypothyroidism, unspecified: Secondary | ICD-10-CM

## 2019-07-12 LAB — BASIC METABOLIC PANEL
Anion gap: 10 (ref 5–15)
BUN: 12 mg/dL (ref 8–23)
CO2: 25 mmol/L (ref 22–32)
Calcium: 9 mg/dL (ref 8.9–10.3)
Chloride: 96 mmol/L — ABNORMAL LOW (ref 98–111)
Creatinine, Ser: 1.12 mg/dL — ABNORMAL HIGH (ref 0.44–1.00)
GFR calc Af Amer: 51 mL/min — ABNORMAL LOW (ref >60)
GFR calc non Af Amer: 44 mL/min — ABNORMAL LOW (ref >60)
Glucose, Bld: 116 mg/dL — ABNORMAL HIGH (ref 70–99)
Potassium: 3.7 mmol/L (ref 3.5–5.1)
Sodium: 131 mmol/L — ABNORMAL LOW (ref 135–145)

## 2019-07-12 LAB — CBC
HCT: 30.2 % — ABNORMAL LOW (ref 36.0–46.0)
Hemoglobin: 10.2 g/dL — ABNORMAL LOW (ref 12.0–15.0)
MCH: 30.7 pg (ref 26.0–34.0)
MCHC: 33.8 g/dL (ref 30.0–36.0)
MCV: 91 fL (ref 80.0–100.0)
Platelets: 388 10*3/uL (ref 150–400)
RBC: 3.32 MIL/uL — ABNORMAL LOW (ref 3.87–5.11)
RDW: 12.7 % (ref 11.5–15.5)
WBC: 9.6 10*3/uL (ref 4.0–10.5)
nRBC: 0 % (ref 0.0–0.2)

## 2019-07-12 LAB — GLUCOSE, CAPILLARY
Glucose-Capillary: 117 mg/dL — ABNORMAL HIGH (ref 70–99)
Glucose-Capillary: 249 mg/dL — ABNORMAL HIGH (ref 70–99)
Glucose-Capillary: 198 mg/dL — ABNORMAL HIGH (ref 70–99)

## 2019-07-12 NOTE — Progress Notes (Signed)
Lisa Mills , MD [address] [place], [place], [place], [address] [address], [place], [place], [place], [address] Phone: [phone]  Fax: [phone]   OSHA RANE is being followed for constipation day 2 of follow up   Subjective: Good bowel movement yesterday , feels much better    Objective: Vital signs in last 24 hours: Vitals:   07/11/19 0730 07/11/19 1604 07/12/19 0055 07/12/19 0750  BP: (!) 185/80 (!) 147/72 (!) 161/70 (!) 167/72  Pulse: 76 84 72 70  Resp: 16 16 16 16   Temp: 97.9 F (36.6 C) 98.7 F (37.1 C) 98.7 F (37.1 C) 98.3 F (36.8 C)  TempSrc: Oral Oral Oral Oral  SpO2: 97% 98% 97% 100%  Weight:      Height:       Weight change:   Intake/Output Summary (Last 24 hours) at 07/12/2019 1209 Last data filed at 07/12/2019 1026 Gross per 24 hour  Intake 480 ml  Output --  Net 480 ml     Exam:  Abdomen: soft, nontender, normal bowel sounds   Lab Results: @LABTEST2 @ Micro Results: Recent Results (from the past 240 hour(s))  SARS Coronavirus 2 by RT PCR (hospital order, performed in [place] hospital lab) Nasopharyngeal Nasopharyngeal Swab     Status: None   Collection Time: 07/04/19 12:58 PM   Specimen: Nasopharyngeal Swab  Result Value Ref Range Status   SARS Coronavirus 2 NEGATIVE NEGATIVE Final    Comment: (NOTE) SARS-CoV-2 target nucleic acids are NOT DETECTED.  The SARS-CoV-2 RNA is generally detectable in upper and lower respiratory specimens during the acute phase of infection. The lowest concentration of SARS-CoV-2 viral copies this assay can detect is 250 copies / mL. A negative result does not preclude SARS-CoV-2 infection and should not be used as the sole basis for treatment or other patient management decisions.  A negative result may occur with improper specimen collection / handling, submission of specimen other than nasopharyngeal swab, presence of viral mutation(s) within the areas targeted by this assay, and  inadequate number of viral copies (<250 copies / mL). A negative result must be combined with clinical observations, patient history, and epidemiological information.  Fact Sheet for Patients:   StrictlyIdeas.no  Fact Sheet for Healthcare Providers: BankingDealers.co.za  This test is not yet approved or  cleared by the [place] FDA and has been authorized for detection and/or diagnosis of SARS-CoV-2 by FDA under an Emergency Use Authorization (EUA).  This EUA will remain in effect (meaning this test can be used) for the duration of the COVID-19 declaration under Section 564(b)(1) of the Act, 21 U.S.C. section 360bbb-3(b)(1), unless the authorization is terminated or revoked sooner.  Performed at [facility], [address], [place], [place] [address]   Urine Culture     Status: Abnormal   Collection Time: 07/04/19  3:55 PM   Specimen: Urine, Random  Result Value Ref Range Status   Specimen Description   Final    URINE, RANDOM Performed at [facility], [address], [place], [place] [address]    Special Requests   Final    NONE Performed at [facility], [place] [place],  [address]    Culture MULTIPLE SPECIES PRESENT, SUGGEST RECOLLECTION (A)  Final   Report Status 07/06/2019 FINAL  Final  Urine Culture     Status: None   Collection Time: 07/06/19  6:16 PM   Specimen: Urine, Random  Result Value Ref Range Status  Specimen Description   Final    URINE, RANDOM Performed at [facility], [address], [place], [place] [address]    Special Requests   Final    NONE Performed at [facility], [address] [place], [place] [address]    Culture   Final    NO GROWTH Performed at [facility], [place] [address], [place], [place] [address]    Report Status 07/09/2019 FINAL  Final  SARS Coronavirus 2 by RT PCR (hospital order, performed in [facility]  hospital lab) Nasopharyngeal Nasopharyngeal Swab     Status: None   Collection Time: 07/09/19 10:46 PM   Specimen: Nasopharyngeal Swab  Result Value Ref Range Status   SARS Coronavirus 2 NEGATIVE NEGATIVE Final    Comment: (NOTE) SARS-CoV-2 target nucleic acids are NOT DETECTED.  The SARS-CoV-2 RNA is generally detectable in upper and lower respiratory specimens during the acute phase of infection. The lowest concentration of SARS-CoV-2 viral copies this assay can detect is 250 copies / mL. A negative result does not preclude SARS-CoV-2 infection and should not be used as the sole basis for treatment or other patient management decisions.  A negative result may occur with improper specimen collection / handling, submission of specimen other than nasopharyngeal swab, presence of viral mutation(s) within the areas targeted by this assay, and inadequate number of viral copies (<250 copies / mL). A negative result must be combined with clinical observations, patient history, and epidemiological information.  Fact Sheet for Patients:   StrictlyIdeas.no  Fact Sheet for Healthcare Providers: BankingDealers.co.za  This test is not yet approved or  cleared by the [place] FDA and has been authorized for detection and/or diagnosis of SARS-CoV-2 by FDA under an Emergency Use Authorization (EUA).  This EUA will remain in effect (meaning this test can be used) for the duration of the COVID-19 declaration under Section 564(b)(1) of the Act, 21 U.S.C. section 360bbb-3(b)(1), unless the authorization is terminated or revoked sooner.  Performed at [facility], [address], [place],  [address]    Studies/Results: No results found. Medications: I have reviewed the patient's current medications. Scheduled Meds: . cholecalciferol  1,000 Units Oral Daily  . docusate sodium  100 mg Oral BID  . enoxaparin (LOVENOX) injection   30 mg Subcutaneous Q24H  . escitalopram  5 mg Oral Daily  . feeding supplement (ENSURE ENLIVE)  237 mL Oral TID  . insulin aspart  0-9 Units Subcutaneous TID PC & HS  . levothyroxine  25 mcg Oral Q0600  . lisinopril  40 mg Oral Daily  . naloxegol oxalate  12.5 mg Oral Daily  . pantoprazole  40 mg Oral Daily  . simvastatin  40 mg Oral QHS   Continuous Infusions: PRN Meds:.acetaminophen **OR** acetaminophen, magnesium hydroxide, morphine injection, sodium phosphate   Assessment: Active Problems:   Constipation   Radicular pain of sacrum  Follow-up note for severe constipation likely secondary to use of oxycodone and tramadol.  Fracture history to the pubic ramus.  Prior history of chronic constipation.  Acute constipation has resolved, suggest commencing on Trulance 3 mg a day .   I will sign off.  Please call me if any further GI concerns or questions.  We would like to thank you for the opportunity to participate in the care of Lisa Mills.    LOS: 1 day   Lisa Bellows, MD 07/12/2019, 12:09 PM

## 2019-07-12 NOTE — Progress Notes (Signed)
Physical Therapy Treatment Patient Details Name: Lisa Mills MRN: [id number] DOB: 11/27/1929 Today's Date: 07/12/2019    History of Present Illness Pt admitted for constipation following narcotic use after last admission. History includes DM, CKD, anemia, HTN, and GERD. Previous admission included sacroplasty (07/07/19) following fall.    PT Comments    Patient received in bed. Asking if she is going home today. She reports little to no pain at rest in bed. Pain with rolling to don diaper. Requires min guard assist for supine to sit due to pain with mobility. She requires min guard for sit to stand from bed. Ambulated 30 feet with RW and min guard/supervision. She is steady with mobility, just pain limited as far as distance. She will continue to benefit from skilled PT while here to improve activity tolerance for return home.     Follow Up Recommendations  Home health PT;Supervision for mobility/OOB     Equipment Recommendations  None recommended by PT    Recommendations for Other Services       Precautions / Restrictions Precautions Precautions: Fall Restrictions Weight Bearing Restrictions: No    Mobility  Bed Mobility Overal bed mobility: Needs Assistance Bed Mobility: Supine to Sit     Supine to sit: Min guard     General bed mobility comments: Reporting severe pain with mobility. Moaning  Transfers Overall transfer level: Needs assistance Equipment used: Rolling walker (2 wheeled) Transfers: Sit to/from Stand Sit to Stand: Min guard         General transfer comment: Patient able to stand with min guard. Requires use of diaper with mobility as she is incontinent with movement.  Ambulation/Gait Ambulation/Gait assistance: Min guard Gait Distance (Feet): 30 Feet Assistive device: Rolling walker (2 wheeled) Gait Pattern/deviations: Step-to pattern;Shuffle;Decreased stride length Gait velocity: decr   General Gait Details: Patient ambulates with min  guard/supervision. Steady. Pain limiting distance.   Stairs             Wheelchair Mobility    Modified Rankin (Stroke Patients Only)       Balance Overall balance assessment: Needs assistance Sitting-balance support: Feet supported Sitting balance-Leahy Scale: Good     Standing balance support: Bilateral upper extremity supported;During functional activity Standing balance-Leahy Scale: Good Standing balance comment: reliant on RW for pain control and safety                            Cognition Arousal/Alertness: Awake/alert Behavior During Therapy: WFL for tasks assessed/performed Overall Cognitive Status: Within Functional Limits for tasks assessed                                        Exercises Total Joint Exercises Ankle Circles/Pumps: AROM;10 reps Quad Sets: AROM;10 reps    General Comments        Pertinent Vitals/Pain Pain Assessment: 0-10 Pain Score: 10-Worst pain ever Pain Location: sacrum Pain Descriptors / Indicators: Sore;Aching Pain Intervention(s): Monitored during session    Home Living                      Prior Function            PT Goals (current goals can now be found in the care plan section) Acute Rehab PT Goals Patient Stated Goal: To walk without pain PT Goal Formulation: With patient Time For Goal  Achievement: 07/24/19 Potential to Achieve Goals: Good Progress towards PT goals: Progressing toward goals    Frequency    Min 2X/week      PT Plan Current plan remains appropriate    Co-evaluation              AM-PAC PT "6 Clicks" Mobility   Outcome Measure  Help needed turning from your back to your side while in a flat bed without using bedrails?: A Little Help needed moving from lying on your back to sitting on the side of a flat bed without using bedrails?: A Little Help needed moving to and from a bed to a chair (including a wheelchair)?: A Little Help needed standing  up from a chair using your arms (e.g., wheelchair or bedside chair)?: A Little Help needed to walk in hospital room?: A Little Help needed climbing 3-5 steps with a railing? : A Lot 6 Click Score: 17    End of Session Equipment Utilized During Treatment: Gait belt Activity Tolerance: Patient limited by pain Patient left: in chair;with call bell/phone within reach;with chair alarm set Nurse Communication: Mobility status PT Visit Diagnosis: Unsteadiness on feet (R26.81);Muscle weakness (generalized) (M62.81);Other abnormalities of gait and mobility (R26.89);Pain Pain - part of body:  (tailbone)     Time: 4514-6047 PT Time Calculation (min) (ACUTE ONLY): 20 min  Charges:  $Gait Training: 8-22 mins                     [name], PT, GCS 07/12/19,10:48 AM

## 2019-07-12 NOTE — Progress Notes (Signed)
PROGRESS NOTE    Lisa Mills  BDZ:[id number] DOB: [birth year] DOA: 07/09/2019 PCP: Olin Hauser, DO   Brief Narrative:  HPI on 07/10/2019 by Dr. Eugenie Norrie Caria Transue  is a 86 y.o. Caucasian female with a known history of hypertension and GERD as well as a recent fall with pelvic and sacral fracture for which she underwent sacral plasty by Dr. Youlanda Mighty and was discharged last week.  The patient has been having severe constipation with no bowel movements for at least 7 days.  She has tried mag citrate as well as laxatives with no help.  She has been having significant diminished appetite.  She can barely move without significant pain still at the sacral area.  No fever or chills.  No nausea or vomiting or abdominal pain.  No dysuria, oliguria or hematuria or flank pain.  Upon presentation to the emergency room, vital signs were within normal.  Labs were remarkable for mild anemia.  COVID-19 PCR came back negative. 1 view abdomen x-ray showed moderate stool in hepatic flexure and descending colon with no fecal impaction in the rectum.  It showed displaced fractures of the left pubic body and left superior pubic ramus as demonstrated on previous CT scan..  The patient will be admitted to an observation medical bed for further evaluation and management.  Interim history Admitted with severe constipation and continues to have persistent pain after sacroplasty.  Now complaining of urinary frequency as well as suprapubic pain.  She was able to have a bowel movement on 6/22.  Assessment & Plan   Severe constipation -Likely secondary to narcotic use from recent sacral plasty -Patient was placed on Movantik, MiraLAX and Colace -Did have a bowel movement on 07/11/2019 -Will continue to monitor  History of recent sacroplasty -Patient with persistent persistent significant pain -Continue morphine -Will reach out to Dr. Rudene Christians -OT recommended [facility] -PT eval pending  Urinary frequency -UA  obtained and unremarkable for infection -Urine culture and still pending -Patient states this has been ongoing since her surgery she does complain of suprapubic pain -If the urine culture is positive, will start on antibiotics -Wonder if this is due to to recent sacroplasty  Diabetes mellitus, type II -Continue insulin sliding scale with CBG monitoring  Depression -Continue Lexapro  Hypothyroidism -Continue Synthroid  Hypertension -Continue lisinopril  Normocytic anemia -Hemoglobin currently 10.2, appears to be stable -Continue to monitor CBC  Chronic kidney disease, stage IIIa -creatinine stable, continue to monitor BMP  Hyponatremia, mild -requested IVF to be discontinued given urinary frequency -will continue to monitor BMP   DVT Prophylaxis  lovenox  Code Status: Full  Family Communication: None at bedside  Disposition Plan:  Status is: Inpatient  Remains inpatient appropriate because:Ongoing active pain requiring inpatient pain management and complaining of urinary frequency/suprapubic pain.    Dispo: The patient is from: Home              Anticipated d/c is to: Home w/ [facility]              Anticipated d/c date is: 1 day              Patient currently is not medically stable to d/c.  Consultants None  Procedures  None  Antibiotics   Anti-infectives (From admission, onward)   None      Subjective:   Lisa Mills seen and examined today.  Continues to complain of urinary frequency and states that it just comes out when she lays on  her side.  Also complains of suprapubic pain.  Was able to have a bowel movement yesterday.  Continues to have sacral pain.  Denies current chest pain, shortness of breath, dizziness or headache.  Objective:   Vitals:   07/11/19 0730 07/11/19 1604 07/12/19 0055 07/12/19 0750  BP: (!) 185/80 (!) 147/72 (!) 161/70 (!) 167/72  Pulse: 76 84 72 70  Resp: 16 16 16 16   Temp: 97.9 F (36.6 C) 98.7 F (37.1 C) 98.7 F (37.1 C)  98.3 F (36.8 C)  TempSrc: Oral Oral Oral Oral  SpO2: 97% 98% 97% 100%  Weight:      Height:        Intake/Output Summary (Last 24 hours) at 07/12/2019 0932 Last data filed at 07/11/2019 2200 Gross per 24 hour  Intake 240 ml  Output --  Net 240 ml   Filed Weights   07/09/19 1431  Weight: 57 kg    Exam  General: Well developed, well nourished, NAD, appears stated age  11: NCAT, mucous membranes moist.   Cardiovascular: S1 S2 auscultated, RRR, no murmur  Respiratory: Clear to auscultation bilaterally  Abdomen: Soft, nontender, nondistended, + bowel sounds  Extremities: warm dry without cyanosis clubbing or edema  Neuro: AAOx3, nonfocal  Psych: Appropriate mood and affect   Data Reviewed: I have personally reviewed following labs and imaging studies  CBC: Recent Labs  Lab 07/07/19 0516 07/09/19 1432 07/10/19 0403 07/11/19 0432 07/12/19 0618  WBC 8.2 12.5* 9.2 9.3 9.6  NEUTROABS  --  9.7*  --   --   --   HGB 8.7* 11.2* 9.5* 9.7* 10.2*  HCT 26.1* 32.3* 27.4* 28.9* 30.2*  MCV 93.9 90.0 88.7 92.3 91.0  PLT 341 387 357 362 465   Basic Metabolic Panel: Recent Labs  Lab 07/07/19 0516 07/09/19 1432 07/10/19 0403 07/11/19 0432 07/12/19 0618  NA 135 130* 133* 132* 131*  K 4.4 4.4 3.9 3.9 3.7  CL 105 96* 98 99 96*  CO2 24 21* 29 26 25   GLUCOSE 132* 116* 53* 99 116*  BUN 18 15 14 12 12   CREATININE 1.19* 1.09* 1.08* 1.10* 1.12*  CALCIUM 8.5* 10.0 9.1 8.5* 9.0   GFR: Estimated Creatinine Clearance: 27.2 mL/min (A) (by C-G formula based on SCr of 1.12 mg/dL (H)). Liver Function Tests: Recent Labs  Lab 07/09/19 1432  AST 25  ALT 13  ALKPHOS 88  BILITOT 0.8  PROT 7.2  ALBUMIN 4.0   No results for input(s): LIPASE, AMYLASE in the last 168 hours. No results for input(s): AMMONIA in the last 168 hours. Coagulation Profile: No results for input(s): INR, PROTIME in the last 168 hours. Cardiac Enzymes: No results for input(s): CKTOTAL, CKMB,  CKMBINDEX, TROPONINI in the last 168 hours. BNP (last 3 results) No results for input(s): PROBNP in the last 8760 hours. HbA1C: Recent Labs    07/10/19 0403  HGBA1C 7.2*   CBG: Recent Labs  Lab 07/11/19 0819 07/11/19 1143 07/11/19 1640 07/11/19 2135 07/12/19 0749  GLUCAP 98 98 179* 153* 117*   Lipid Profile: No results for input(s): CHOL, HDL, LDLCALC, TRIG, CHOLHDL, LDLDIRECT in the last 72 hours. Thyroid Function Tests: No results for input(s): TSH, T4TOTAL, FREET4, T3FREE, THYROIDAB in the last 72 hours. Anemia Panel: No results for input(s): VITAMINB12, FOLATE, FERRITIN, TIBC, IRON, RETICCTPCT in the last 72 hours. Urine analysis:    Component Value Date/Time   COLORURINE COLORLESS (A) 07/11/2019 1321   APPEARANCEUR CLEAR (A) 07/11/2019 1321   APPEARANCEUR clear  03/29/2017 0000   LABSPEC 1.005 07/11/2019 1321   PHURINE 8.0 07/11/2019 1321   GLUCOSEU NEGATIVE 07/11/2019 1321   HGBUR NEGATIVE 07/11/2019 1321   BILIRUBINUR NEGATIVE 07/11/2019 1321   BILIRUBINUR Negative (A) 03/29/2017 0000   KETONESUR NEGATIVE 07/11/2019 1321   PROTEINUR NEGATIVE 07/11/2019 1321   UROBILINOGEN Normal 03/29/2017 0000   NITRITE NEGATIVE 07/11/2019 1321   LEUKOCYTESUR NEGATIVE 07/11/2019 1321   Sepsis Labs: @LABRCNTIP (procalcitonin:4,lacticidven:4)  ) Recent Results (from the past 240 hour(s))  SARS Coronavirus 2 by RT PCR (hospital order, performed in [facility] hospital lab) Nasopharyngeal Nasopharyngeal Swab     Status: None   Collection Time: 07/04/19 12:58 PM   Specimen: Nasopharyngeal Swab  Result Value Ref Range Status   SARS Coronavirus 2 NEGATIVE NEGATIVE Final    Comment: (NOTE) SARS-CoV-2 target nucleic acids are NOT DETECTED.  The SARS-CoV-2 RNA is generally detectable in upper and lower respiratory specimens during the acute phase of infection. The lowest concentration of SARS-CoV-2 viral copies this assay can detect is 250 copies / mL. A negative result does not  preclude SARS-CoV-2 infection and should not be used as the sole basis for treatment or other patient management decisions.  A negative result may occur with improper specimen collection / handling, submission of specimen other than nasopharyngeal swab, presence of viral mutation(s) within the areas targeted by this assay, and inadequate number of viral copies (<250 copies / mL). A negative result must be combined with clinical observations, patient history, and epidemiological information.  Fact Sheet for Patients:   StrictlyIdeas.no  Fact Sheet for Healthcare Providers: BankingDealers.co.za  This test is not yet approved or  cleared by the [place] FDA and has been authorized for detection and/or diagnosis of SARS-CoV-2 by FDA under an Emergency Use Authorization (EUA).  This EUA will remain in effect (meaning this test can be used) for the duration of the COVID-19 declaration under Section 564(b)(1) of the Act, 21 U.S.C. section 360bbb-3(b)(1), unless the authorization is terminated or revoked sooner.  Performed at [facility], [address] [place], [place] [address]   Urine Culture     Status: Abnormal   Collection Time: 07/04/19  3:55 PM   Specimen: Urine, Random  Result Value Ref Range Status   Specimen Description   Final    URINE, RANDOM Performed at [facility], [address], [place], [place] [address]    Special Requests   Final    NONE Performed at [facility], [address] [place], [place] [address]    Culture MULTIPLE SPECIES PRESENT, SUGGEST RECOLLECTION (A)  Final   Report Status 07/06/2019 FINAL  Final  Urine Culture     Status: None   Collection Time: 07/06/19  6:16 PM   Specimen: Urine, Random  Result Value Ref Range Status   Specimen Description   Final    URINE, RANDOM Performed at [facility], [address] [place], [place] [address]    Special  Requests   Final    NONE Performed at [facility], [address] [place], [place] [address]    Culture   Final    NO GROWTH Performed at [facility], [place] [address], [place], [place] [address]    Report Status 07/09/2019 FINAL  Final  SARS Coronavirus 2 by RT PCR (hospital order, performed in [facility] hospital lab) Nasopharyngeal Nasopharyngeal Swab     Status: None   Collection Time: 07/09/19 10:46 PM   Specimen:  Nasopharyngeal Swab  Result Value Ref Range Status   SARS Coronavirus 2 NEGATIVE NEGATIVE Final    Comment: (NOTE) SARS-CoV-2 target nucleic acids are NOT DETECTED.  The SARS-CoV-2 RNA is generally detectable in upper and lower respiratory specimens during the acute phase of infection. The lowest concentration of SARS-CoV-2 viral copies this assay can detect is 250 copies / mL. A negative result does not preclude SARS-CoV-2 infection and should not be used as the sole basis for treatment or other patient management decisions.  A negative result may occur with improper specimen collection / handling, submission of specimen other than nasopharyngeal swab, presence of viral mutation(s) within the areas targeted by this assay, and inadequate number of viral copies (<250 copies / mL). A negative result must be combined with clinical observations, patient history, and epidemiological information.  Fact Sheet for Patients:   StrictlyIdeas.no  Fact Sheet for Healthcare Providers: BankingDealers.co.za  This test is not yet approved or  cleared by the [place] FDA and has been authorized for detection and/or diagnosis of SARS-CoV-2 by FDA under an Emergency Use Authorization (EUA).  This EUA will remain in effect (meaning this test can be used) for the duration of the COVID-19 declaration under Section 564(b)(1) of the Act, 21 U.S.C. section 360bbb-3(b)(1), unless the authorization is terminated  or revoked sooner.  Performed at [facility], [address] [place],  [address]       Radiology Studies: No results found.   Scheduled Meds: . cholecalciferol  1,000 Units Oral Daily  . docusate sodium  100 mg Oral BID  . enoxaparin (LOVENOX) injection  30 mg Subcutaneous Q24H  . escitalopram  5 mg Oral Daily  . feeding supplement (ENSURE ENLIVE)  237 mL Oral TID  . insulin aspart  0-9 Units Subcutaneous TID PC & HS  . levothyroxine  25 mcg Oral Q0600  . lisinopril  40 mg Oral Daily  . naloxegol oxalate  12.5 mg Oral Daily  . pantoprazole  40 mg Oral Daily  . simvastatin  40 mg Oral QHS   Continuous Infusions:   LOS: 1 day   Time Spent in minutes   45 minutes  Vedika Dumlao D.O. on 07/12/2019 at 9:32 AM  Between 7am to 7pm - Please see pager noted on [email or web address]  After 7pm go to [email or web address]  And look for the night coverage person covering for me after hours  [facility] Office  [phone]

## 2019-07-13 DIAGNOSIS — M533 Sacrococcygeal disorders, not elsewhere classified: Secondary | ICD-10-CM

## 2019-07-13 LAB — CBC
HCT: 29.6 % — ABNORMAL LOW (ref 36.0–46.0)
Hemoglobin: 10.3 g/dL — ABNORMAL LOW (ref 12.0–15.0)
MCH: 30.9 pg (ref 26.0–34.0)
MCHC: 34.8 g/dL (ref 30.0–36.0)
MCV: 88.9 fL (ref 80.0–100.0)
Platelets: 403 10*3/uL — ABNORMAL HIGH (ref 150–400)
RBC: 3.33 MIL/uL — ABNORMAL LOW (ref 3.87–5.11)
RDW: 12.6 % (ref 11.5–15.5)
WBC: 8.8 10*3/uL (ref 4.0–10.5)
nRBC: 0 % (ref 0.0–0.2)

## 2019-07-13 LAB — BASIC METABOLIC PANEL
Anion gap: 9 (ref 5–15)
BUN: 14 mg/dL (ref 8–23)
CO2: 28 mmol/L (ref 22–32)
Calcium: 9.1 mg/dL (ref 8.9–10.3)
Chloride: 95 mmol/L — ABNORMAL LOW (ref 98–111)
Creatinine, Ser: 1.09 mg/dL — ABNORMAL HIGH (ref 0.44–1.00)
GFR calc Af Amer: 53 mL/min — ABNORMAL LOW (ref >60)
GFR calc non Af Amer: 46 mL/min — ABNORMAL LOW (ref >60)
Glucose, Bld: 129 mg/dL — ABNORMAL HIGH (ref 70–99)
Potassium: 4.7 mmol/L (ref 3.5–5.1)
Sodium: 132 mmol/L — ABNORMAL LOW (ref 135–145)

## 2019-07-13 LAB — URINE CULTURE: Culture: 10000 — AB

## 2019-07-13 LAB — GLUCOSE, CAPILLARY
Glucose-Capillary: 176 mg/dL — ABNORMAL HIGH (ref 70–99)
Glucose-Capillary: 122 mg/dL — ABNORMAL HIGH (ref 70–99)
Glucose-Capillary: 185 mg/dL — ABNORMAL HIGH (ref 70–99)

## 2019-07-13 MED ORDER — ENSURE ENLIVE PO LIQD: 237.0000 mL | Freq: Three times a day (TID) | ORAL | Status: DC

## 2019-07-13 MED ORDER — TRULANCE 3 MG PO TABS: 3.0000 mg | ORAL_TABLET | Freq: Every day | ORAL | 0 refills | Status: DC

## 2019-07-13 MED ORDER — LISINOPRIL 40 MG PO TABS: 40.0000 mg | ORAL_TABLET | Freq: Every day | ORAL | 0 refills | Status: DC

## 2019-07-13 NOTE — Progress Notes (Signed)
Pt d/c home via daughter this afternoon.  All belongings taken at time of d/c.  NAD noted, VS WNL.  IV removed from pts arm without issue.  D/C summary reviewed with pt and daughter and they both expressed understanding.  Pt was wheeled down to medical mall by NTs and assisted into car.

## 2019-07-13 NOTE — Discharge Instructions (Signed)

## 2019-07-13 NOTE — Discharge Summary (Signed)
Physician Discharge Summary  Lisa Mills WVP:[id number] DOB: [birth year] DOA: 07/09/2019  PCP: Olin Hauser, DO  Admit date: 07/09/2019 Discharge date: 07/13/2019  Time spent: 45 minutes  Recommendations for Outpatient Follow-up:  Patient will be discharged to home with home health physical and occupational therapy.  Patient will need to follow up with primary care provider within one week of discharge, repeat BMP. Follow up with Dr. Rudene Christians. Follow up with urology.  Patient should continue medications as prescribed.  Patient should follow a carb modified diet.   Discharge Diagnoses:  Severe constipation History of recent sacroplasty Urinary frequency Diabetes mellitus, type II Depression Hypothyroidism Hypertension Normocytic anemia Chronic kidney disease, stage IIIa Hyponatremia, mild   Discharge Condition: Stable  Diet recommendation: carb modified  Filed Weights   07/09/19 1431  Weight: 57 kg    History of present illness:  on 07/10/2019 by Dr. Eugenie Norrie [name] 86 y.o.Caucasian femalewith a known history of hypertension and GERD as well as a recent fall with pelvic and sacral fracture for which she underwent sacral plasty by Dr. Youlanda Mighty and was discharged last week. The patient has been having severe constipation with no bowel movements for at least 7 days. She has tried mag citrate as well as laxatives with no help. She has been having significant diminished appetite. She can barely move without significant pain still at the sacral area. No fever or chills. No nausea or vomiting or abdominal pain. No dysuria, oliguria or hematuria or flank pain.  Upon presentation to the emergency room, vital signs were within normal. Labs were remarkable for mild anemia. COVID-19 PCR came back negative.1 view abdomen x-ray showed moderate stool in hepatic flexure and descending colon with no fecal impaction in the rectum. It showed displaced fractures of  the left pubic body and left superior pubic ramus as demonstrated on previous CT scan..  The patient will be admitted to an observation medical bed for further evaluation and management.  Hospital Course:  Severe constipation -Likely secondary to narcotic use from recent sacral plasty -Patient was placed on Movantik, MiraLAX and Colace -resolved, able to have several bowel movements -GI consulted and appreciated, recommended continuing trulance '3mg'$  daily  History of recent sacroplasty -Patient with persistent persistent significant pain -Discussed with Dr. Rudene Christians- stated it will take time before patient's pain is better- outpatient follow up -OT/PT recommended [facility]  Urinary frequency -UA obtained and unremarkable for infection -Urine culture 10K colonies of Ecoli -Patient states this has been ongoing since her surgery she does complain of suprapubic pain -Discussed with Dr. Rudene Christians, patient had been complaining of frequency prior to surgery -Discussed with Dr. Diamantina Providence, urology, recommended obtaining a bladder scan as well as outpatient follow-up in 2 weeks.  Likely secondary to recent fracture and surgery and will likely improve in the next few weeks.  Does not recommend anticholinergics. -bladder scan showed 160cc  Diabetes mellitus, type II -Continue home regimen  Depression -Continue Lexapro  Hypothyroidism -Continue Synthroid  Hypertension -Continue lisinopril  Normocytic anemia -Hemoglobin currently 10.3, appears to be stable  Chronic kidney disease, stage IIIa -creatinine stable, 1.03  Hyponatremia, mild -requested IVF to be discontinued given urinary frequency -Na 132 -repeat BMP in one week  Consultants Discussed with Dr. Rudene Christians, ortho  Procedures  None  Discharge Exam: Vitals:   07/12/19 2300 07/13/19 0810  BP: (!) 174/78 (!) 178/84  Pulse: 72 80  Resp: 16   Temp: 98.5 F (36.9 C) 98.1 F (36.7 C)  SpO2: 98% 98%  General: Well  developed, well nourished, NAD, appears stated age  84: NCAT, mucous membranes moist.  Cardiovascular: S1 S2 auscultated,RRR  Respiratory: Clear to auscultation bilaterally   Abdomen: Soft, nontender, nondistended, + bowel sounds  Extremities: warm dry without cyanosis clubbing or edema  Neuro: AAOx3, nonfocal  Psych: Appropriate mood and affect, pleasant  Discharge Instructions Discharge Instructions    Discharge instructions   Complete by: As directed    Patient will be discharged to home with home health physical and occupational therapy.  Patient will need to follow up with primary care provider within one week of discharge, repeat BMP. Follow up with Dr. Rudene Christians. Follow up with urology.  Patient should continue medications as prescribed.  Patient should follow a carb modified diet.   Discharge wound care:   Complete by: As directed    Per pervious surgical recommendations     Allergies as of 07/13/2019      Reactions   Aspirin Other (See Comments)   Burns stomach   Conray [iothalamate] Hives   IV dye conray-400   Dye Fdc Red [red Dye] Hives   Sulfa Antibiotics    Unknown Reaction, not used in years   Prednisone    Indigestion       Medication List    STOP taking these medications   Biotin 1000 MCG tablet   mupirocin ointment 2 % Commonly known as: Bactroban   PROBIOTIC-10 PO     TAKE these medications   cholecalciferol 1000 units tablet Commonly known as: VITAMIN D Take 1,000 Units by mouth daily.   cyclobenzaprine 5 MG tablet Commonly known as: FLEXERIL Take 1 tablet (5 mg total) by mouth 3 (three) times daily.   escitalopram 5 MG tablet Commonly known as: LEXAPRO TAKE 1 TABLET BY MOUTH ONCE DAILY WITH FOOD What changed: See the new instructions.   feeding supplement (ENSURE ENLIVE) Liqd Take 237 mLs by mouth 3 (three) times daily.   glimepiride 4 MG tablet Commonly known as: AMARYL Take 0.5 tablets (2 mg total) by mouth daily. With meal,  either breakfast or dinner.   glucose blood test strip Commonly known as: ONE TOUCH ULTRA TEST CHECK BLOOD SUGAR UP TO 2 TIMES A DAY.   levothyroxine 25 MCG tablet Commonly known as: SYNTHROID TAKE 1 TABLET BY MOUTH ONCE DAILY ON AN EMPTY STOMACH. WAIT 30 MINUTES BEFORE TAKING OTHER MEDS.   lisinopril 40 MG tablet Commonly known as: ZESTRIL Take 1 tablet (40 mg total) by mouth daily. Start taking on: July 14, 2019 What changed:   medication strength  how much to take   omeprazole 20 MG capsule Commonly known as: PRILOSEC TAKE 1 CAPSULE BY MOUTH ONCE DAILY   ONE TOUCH BASIC SYSTEM w/Device Kit Use glucometer to check blood sugar daily.   OneTouch Delica Lancets 96E Misc 1 each by Does not apply route 3 (three) times daily. Dx:E11.9   oxyCODONE 5 MG immediate release tablet Commonly known as: Oxy IR/ROXICODONE Take 1 tablet (5 mg total) by mouth every 4 (four) hours as needed for severe pain.   senna 8.6 MG Tabs tablet Commonly known as: SENOKOT Take 1 tablet (8.6 mg total) by mouth 2 (two) times daily.   simvastatin 40 MG tablet Commonly known as: ZOCOR TAKE 1 TABLET BY MOUTH AT BEDTIME   traMADol 50 MG tablet Commonly known as: ULTRAM Take 50 mg by mouth every 12 (twelve) hours as needed for moderate pain.   Trulance 3 MG Tabs Generic drug: Plecanatide Take 3  mg by mouth daily.            Discharge Care Instructions  (From admission, onward)         Start     Ordered   07/13/19 0000  Discharge wound care:       Comments: Per pervious surgical recommendations   07/13/19 1357         Allergies  Allergen Reactions  . Aspirin Other (See Comments)    Burns stomach  . Conray [Iothalamate] Hives    IV dye conray-400  . Dye Fdc Red [Red Dye] Hives  . Sulfa Antibiotics     Unknown Reaction, not used in years  . Prednisone     Indigestion     Follow-up Information    Olin Hauser, DO. Schedule an appointment as soon as possible for a  visit in 1 week(s).   Specialty: Family Medicine Why: Hospital follow up Contact information: [place] [address] [phone]        Hessie Knows, MD. Schedule an appointment as soon as possible for a visit in 1 week(s).   Specialty: Orthopedic Surgery Why: Hospital follow up Contact information: [address] [place] [address] [phone]                The results of significant diagnostics from this hospitalization (including imaging, microbiology, ancillary and laboratory) are listed below for reference.    Significant Diagnostic Studies: DG Sacrum/Coccyx  Result Date: 07/06/2019 CLINICAL DATA:  Sacroplasty EXAM: DG C-ARM 1-60 MIN; SACRUM AND COCCYX - 2+ VIEW COMPARISON:  07/04/2019 FINDINGS: Multiple C-arm images show bilateral sacroplasty. Bilateral methylmethacrylate without identifiable complicating feature. IMPRESSION: Bilateral sacroplasty. Electronically Signed   By: Nelson Chimes M.D.   On: 07/06/2019 19:00   DG Abdomen 1 View  Result Date: 07/09/2019 CLINICAL DATA:  Constipation. EXAM: ABDOMEN - 1 VIEW COMPARISON:  Radiograph dated 03/16/2016 and CT scan of the pelvis dated 07/04/2019 FINDINGS: There are no dilated loops of large or small bowel. There is moderate stool in the hepatic flexure and in the splenic flexure and descending colon but there is no fecal impaction in the rectum. There is a displaced fracture of the left pubic body and left superior pubic ramus as demonstrated on the prior CT scan of 07/04/2019. The patient has undergone bilateral sacroplasty for the bilateral sacral ala fractures demonstrated on that CT scan. No acute bone abnormality. IMPRESSION: 1. Benign-appearing abdomen. Moderate stool in the hepatic flexure and descending colon. No fecal impaction in the rectum. 2. Displaced fractures of the left pubic body and left superior pubic ramus as demonstrated on the prior CT scan.  Electronically Signed   By: Lorriane Shire M.D.   On: 07/09/2019 17:42   CT Lumbar Spine Wo Contrast  Result Date: 07/04/2019 CLINICAL DATA:  Fall 1 month ago.  Pain in tailbone.  Low back pain. EXAM: CT LUMBAR SPINE WITHOUT CONTRAST TECHNIQUE: Multidetector CT imaging of the lumbar spine was performed without intravenous contrast administration. Multiplanar CT image reconstructions were also generated. COMPARISON:  None. FINDINGS: Segmentation: 5 non rib-bearing lumbar type vertebral bodies are present. The lowest fully formed vertebral body is L5. Alignment: Grade 1 anterolisthesis at L4-5 measures 5 mm. Slight retrolisthesis present at L2-3 and L3-4. Vertebrae: Vertebral body heights are maintained. No acute vertebral fracture is present. Bilateral sacral ala fractures are present. Fracture is somewhat more prominent on the right than on the left. Transverse S1 fracture  is present. Paraspinal and other soft tissues: Atherosclerotic calcifications are present in the aorta without aneurysm. Benign appearing cyst is noted anteriorly in the right kidney. No other solid organ lesions are present. Disc levels: L1-2: Leftward disc bulging is present. No significant stenosis is present. L2-3: A broad-based disc protrusion is present. Moderate facet hypertrophy is present. Mild central canal narrowing is present. Moderate foraminal stenosis is present bilaterally. L3-4: A broad-based disc protrusion is present. Advanced facet hypertrophy is noted bilaterally. No significant stenosis is present. L4-5: A broad-based disc protrusion is present. Advanced facet hypertrophy and spurring is noted bilaterally. Mild central canal narrowing is worse on the left. Moderate foraminal stenosis is worse on left. L5-S1: Moderate facet hypertrophy is noted bilaterally. Mild disc bulging is present. Moderate foraminal narrowing present bilaterally. The central canal is patent. IMPRESSION: 1. Bilateral sacral ala fractures and  transverse S1 fracture appear acute and likely are the source of the patient's pain. 2. Multilevel spondylosis of the lumbar spine as described. 3. Mild central canal narrowing and moderate foraminal stenosis bilaterally at L2-3 and L4-5. 4. Moderate foraminal stenosis bilaterally at L5-S1. 5. Aortic Atherosclerosis (ICD10-I70.0). Electronically Signed   By: San Morelle M.D.   On: 07/04/2019 11:32   CT PELVIS WO CONTRAST  Result Date: 07/04/2019 CLINICAL DATA:  Pelvic pain.  Fall approximately 1 month ago EXAM: CT PELVIS WITHOUT CONTRAST TECHNIQUE: Multidetector CT imaging of the pelvis was performed following the standard protocol without intravenous contrast. COMPARISON:  X-ray 06/21/2019 FINDINGS: Bones/Joint/Cartilage Subacute displaced fracture of the parasymphyseal aspect of the left pubic bone with involvement of the superior pubic ramus with moderate angulation and early callus formation (series 5, image 40). Moderately displaced fracture of the mid left inferior pubic ramus with early callus formation (series 3, image 95). These fractures appear unchanged in alignment compared to radiographic study of 06/21/2019. Pubic symphysis is intact with moderate arthropathy including chondrocalcinosis. Nondisplaced mildly comminuted fracture involving the peripheral aspect of the right sacroiliac joint (series 5, image 60) with extension into the right S1 neural foramen (series 3, image 33). Possible extension to the right S2 foramen (series 3, image 35). Fracture extends to the superior aspect of the right SI joint. No SI joint diastasis. There is mild sclerosis within the right hemi sacrum suggesting subacute chronicity. Nondisplaced fracture of the left sacral ala with involvement of the left SI joint (series 5, images 61-68). No SI joint diastasis. No evidence of fracture extension into the sacral foramina. Remaining included osseous structures appear intact. There is moderate right and  mild-to-moderate left hip osteoarthritis. No hip fracture or dislocation. No lytic or sclerotic bone lesion is seen. Ligaments Suboptimally assessed by CT. Muscles and Tendons Rounded fluid collection within the left obturator internus muscle measuring up to 2.4 cm (series 7, image 66; series 4, image 81) adjacent to left inferior pubic ramus fracture. No acute tendinous abnormality within the limitations of CT. Soft tissues Remaining soft tissues appear within normal limits. IMPRESSION: 1. Subacute appearing nondisplaced bilateral sacral alar fractures, as described above. 2. Subacute fractures of the left superior and inferior pubic rami. These fractures appear unchanged in alignment compared to radiographic study of 06/21/2019. 3. Rounded fluid collection within the left obturator internus muscle measuring up to 2.4 cm adjacent to the left inferior pubic ramus fracture, likely reflecting an involving hematoma. Electronically Signed   By: Davina Poke D.O.   On: 07/04/2019 11:31   DG C-Arm 1-60 Min  Result Date: 07/06/2019 CLINICAL DATA:  Sacroplasty EXAM: DG C-ARM 1-60 MIN; SACRUM AND COCCYX - 2+ VIEW COMPARISON:  07/04/2019 FINDINGS: Multiple C-arm images show bilateral sacroplasty. Bilateral methylmethacrylate without identifiable complicating feature. IMPRESSION: Bilateral sacroplasty. Electronically Signed   By: Nelson Chimes M.D.   On: 07/06/2019 19:00   DG HIP UNILAT W OR W/O PELVIS 2-3 VIEWS LEFT  Result Date: 06/21/2019 CLINICAL DATA:  Fall 2 weeks ago, left groin and hip pain EXAM: DG HIP (WITH OR WITHOUT PELVIS) 2-3V LEFT COMPARISON:  None. FINDINGS: Osteopenia. There are angulated, displaced fractures of the left superior and inferior pubic rami. There are no other displaced fractures of the pelvis appreciated. The bilateral proximal femurs are intact. Asymmetric, moderate right femoroacetabular arthrosis. IMPRESSION: Osteopenia. There are angulated, displaced fractures of the left superior  and inferior pubic rami. There are no other displaced fractures of the pelvis appreciated. Please note that osteopenia significantly limits sensitivity for hip, pelvic, and sacral fractures. Recommend CT or MRI to more sensitively evaluate for additional fractures if suspected. Electronically Signed   By: Eddie Candle M.D.   On: 06/21/2019 15:16    Microbiology: Recent Results (from the past 240 hour(s))  SARS Coronavirus 2 by RT PCR (hospital order, performed in [facility] hospital lab) Nasopharyngeal Nasopharyngeal Swab     Status: None   Collection Time: 07/04/19 12:58 PM   Specimen: Nasopharyngeal Swab  Result Value Ref Range Status   SARS Coronavirus 2 NEGATIVE NEGATIVE Final    Comment: (NOTE) SARS-CoV-2 target nucleic acids are NOT DETECTED.  The SARS-CoV-2 RNA is generally detectable in upper and lower respiratory specimens during the acute phase of infection. The lowest concentration of SARS-CoV-2 viral copies this assay can detect is 250 copies / mL. A negative result does not preclude SARS-CoV-2 infection and should not be used as the sole basis for treatment or other patient management decisions.  A negative result may occur with improper specimen collection / handling, submission of specimen other than nasopharyngeal swab, presence of viral mutation(s) within the areas targeted by this assay, and inadequate number of viral copies (<250 copies / mL). A negative result must be combined with clinical observations, patient history, and epidemiological information.  Fact Sheet for Patients:   StrictlyIdeas.no  Fact Sheet for Healthcare Providers: BankingDealers.co.za  This test is not yet approved or  cleared by the [place] FDA and has been authorized for detection and/or diagnosis of SARS-CoV-2 by FDA under an Emergency Use Authorization (EUA).  This EUA will remain in effect (meaning this test can be used) for the  duration of the COVID-19 declaration under Section 564(b)(1) of the Act, 21 U.S.C. section 360bbb-3(b)(1), unless the authorization is terminated or revoked sooner.  Performed at [facility], [address], [place], [place] [address]   Urine Culture     Status: Abnormal   Collection Time: 07/04/19  3:55 PM   Specimen: Urine, Random  Result Value Ref Range Status   Specimen Description   Final    URINE, RANDOM Performed at [facility], [address], [place], [place] [address]    Special Requests   Final    NONE Performed at [facility], [address] [place],  [address]    Culture MULTIPLE SPECIES PRESENT, SUGGEST RECOLLECTION (A)  Final   Report Status 07/06/2019 FINAL  Final  Urine Culture     Status: None   Collection Time: 07/06/19  6:16 PM   Specimen: Urine, Random  Result Value Ref Range Status  Specimen Description   Final    URINE, RANDOM Performed at [facility], [address] [place], [place] [address]    Special Requests   Final    NONE Performed at [facility], [address], [place], [place] [address]    Culture   Final    NO GROWTH Performed at [facility], [address], [place], [place] [address]    Report Status 07/09/2019 FINAL  Final  SARS Coronavirus 2 by RT PCR (hospital order, performed in [facility] hospital lab) Nasopharyngeal Nasopharyngeal Swab     Status: None   Collection Time: 07/09/19 10:46 PM   Specimen: Nasopharyngeal Swab  Result Value Ref Range Status   SARS Coronavirus 2 NEGATIVE NEGATIVE Final    Comment: (NOTE) SARS-CoV-2 target nucleic acids are NOT DETECTED.  The SARS-CoV-2 RNA is generally detectable in upper and lower respiratory specimens during the acute phase of infection. The lowest concentration of SARS-CoV-2 viral copies this assay can detect is 250 copies / mL. A negative result does not preclude SARS-CoV-2 infection and should not be used  as the sole basis for treatment or other patient management decisions.  A negative result may occur with improper specimen collection / handling, submission of specimen other than nasopharyngeal swab, presence of viral mutation(s) within the areas targeted by this assay, and inadequate number of viral copies (<250 copies / mL). A negative result must be combined with clinical observations, patient history, and epidemiological information.  Fact Sheet for Patients:   BoilerBrush.com.cy  Fact Sheet for Healthcare Providers: https://pope.com/  This test is not yet approved or  cleared by the [place] FDA and has been authorized for detection and/or diagnosis of SARS-CoV-2 by FDA under an Emergency Use Authorization (EUA).  This EUA will remain in effect (meaning this test can be used) for the duration of the COVID-19 declaration under Section 564(b)(1) of the Act, 21 U.S.C. section 360bbb-3(b)(1), unless the authorization is terminated or revoked sooner.  Performed at [facility], [address], [place], [place] [address]   Urine Culture     Status: Abnormal   Collection Time: 07/11/19  1:21 PM   Specimen: Urine, Random  Result Value Ref Range Status   Specimen Description   Final    URINE, RANDOM Performed at [facility], [address] [place], [place] [address]    Special Requests   Final    NONE Performed at [facility], [address] [place], [place] [address]    Culture 10,000 COLONIES/mL ESCHERICHIA COLI (A)  Final   Report Status 07/13/2019 FINAL  Final   Organism ID, Bacteria ESCHERICHIA COLI (A)  Final      Susceptibility   Escherichia coli - MIC*    AMPICILLIN 4 SENSITIVE Sensitive     CEFAZOLIN <=4 SENSITIVE Sensitive     CEFTRIAXONE <=0.25 SENSITIVE Sensitive     CIPROFLOXACIN <=0.25 SENSITIVE Sensitive     GENTAMICIN <=1 SENSITIVE Sensitive     IMIPENEM <=0.25 SENSITIVE  Sensitive     NITROFURANTOIN <=16 SENSITIVE Sensitive     TRIMETH/SULFA <=20 SENSITIVE Sensitive     AMPICILLIN/SULBACTAM <=2 SENSITIVE Sensitive     PIP/TAZO <=4 SENSITIVE Sensitive     * 10,000 COLONIES/mL ESCHERICHIA COLI     Labs: Basic Metabolic Panel: Recent Labs  Lab 07/09/19 1432 07/10/19 0403 07/11/19 0432 07/12/19 0618 07/13/19 0541  NA 130* 133* 132* 131* 132*  K 4.4 3.9 3.9 3.7 4.7  CL  96* 98 99 96* 95*  CO2 21* '29 26 25 28  '$ GLUCOSE 116* 53* 99 116* 129*  BUN '15 14 12 12 14  '$ CREATININE 1.09* 1.08* 1.10* 1.12* 1.09*  CALCIUM 10.0 9.1 8.5* 9.0 9.1   Liver Function Tests: Recent Labs  Lab 07/09/19 1432  AST 25  ALT 13  ALKPHOS 88  BILITOT 0.8  PROT 7.2  ALBUMIN 4.0   No results for input(s): LIPASE, AMYLASE in the last 168 hours. No results for input(s): AMMONIA in the last 168 hours. CBC: Recent Labs  Lab 07/09/19 1432 07/10/19 0403 07/11/19 0432 07/12/19 0618 07/13/19 0541  WBC 12.5* 9.2 9.3 9.6 8.8  NEUTROABS 9.7*  --   --   --   --   HGB 11.2* 9.5* 9.7* 10.2* 10.3*  HCT 32.3* 27.4* 28.9* 30.2* 29.6*  MCV 90.0 88.7 92.3 91.0 88.9  PLT 387 357 362 388 403*   Cardiac Enzymes: No results for input(s): CKTOTAL, CKMB, CKMBINDEX, TROPONINI in the last 168 hours. BNP: BNP (last 3 results) No results for input(s): BNP in the last 8760 hours.  ProBNP (last 3 results) No results for input(s): PROBNP in the last 8760 hours.  CBG: Recent Labs  Lab 07/12/19 1132 07/12/19 1652 07/12/19 2113 07/13/19 0811 07/13/19 1127  GLUCAP 249* 198* 176* 122* 185*       Signed:  Yahir Tavano  [facility] 07/13/2019, 1:57 PM

## 2019-07-14 ENCOUNTER — Telehealth: Payer: Self-pay

## 2019-07-14 ENCOUNTER — Telehealth: Payer: Self-pay | Admitting: Urology

## 2019-07-14 NOTE — Telephone Encounter (Signed)
-----   Message from Billey Co, MD sent at 07/13/2019 12:51 PM EDT ----- Regarding: follow up Please schedule her with Dr. McDiarmid in 2-3 weeks for pelvic pain/urinary frequency after pelvic fracture  Nickolas Madrid, MD 07/13/2019

## 2019-07-14 NOTE — Telephone Encounter (Signed)
App made Patient is aware Mailed app

## 2019-07-14 NOTE — Telephone Encounter (Signed)
Per her daughter Lisa Mills  Transition Care Management Follow-up Telephone Call  Date of discharge and from where: 07/13/2019 [facility]  How have you been since you were released from the hospital? Still having some pain, no control over bladder  Any questions or concerns? No   Items Reviewed:  Did the pt receive and understand the discharge instructions provided? Yes   Medications obtained and verified? Yes   Any new allergies since your discharge? No   Dietary orders reviewed? Yes  Do you have support at home? Yes   Functional Questionnaire: (I = Independent and D = Dependent) ADLs: Needs assistance  Bathing/Dressing- needs assistance  Meal Prep- D  Eating- I  Maintaining continence- Having some incontinence, daughter assistance  Transferring/Ambulation- I  Managing Meds- I  Follow up appointments reviewed:   PCP Hospital f/u appt confirmed? Yes  Scheduled to see Dr. Parks Ranger on 07/20/2019 @ 9:20..  Are transportation arrangements needed? Yes   If their condition worsens, is the pt aware to call PCP or go to the Emergency Dept.? Yes  Was the patient provided with contact information for the PCP's office or ED? Yes  Was to pt encouraged to call back with questions or concerns? Yes

## 2019-07-15 DIAGNOSIS — I129 Hypertensive chronic kidney disease with stage 1 through stage 4 chronic kidney disease, or unspecified chronic kidney disease: Secondary | ICD-10-CM | POA: Diagnosis not present

## 2019-07-15 DIAGNOSIS — S3282XD Multiple fractures of pelvis without disruption of pelvic ring, subsequent encounter for fracture with routine healing: Secondary | ICD-10-CM | POA: Diagnosis not present

## 2019-07-15 DIAGNOSIS — M47816 Spondylosis without myelopathy or radiculopathy, lumbar region: Secondary | ICD-10-CM | POA: Diagnosis not present

## 2019-07-15 DIAGNOSIS — F411 Generalized anxiety disorder: Secondary | ICD-10-CM | POA: Diagnosis not present

## 2019-07-15 DIAGNOSIS — E1122 Type 2 diabetes mellitus with diabetic chronic kidney disease: Secondary | ICD-10-CM | POA: Diagnosis not present

## 2019-07-15 DIAGNOSIS — E039 Hypothyroidism, unspecified: Secondary | ICD-10-CM | POA: Diagnosis not present

## 2019-07-15 DIAGNOSIS — R350 Frequency of micturition: Secondary | ICD-10-CM | POA: Diagnosis not present

## 2019-07-15 DIAGNOSIS — S3219XD Other fracture of sacrum, subsequent encounter for fracture with routine healing: Secondary | ICD-10-CM | POA: Diagnosis not present

## 2019-07-15 DIAGNOSIS — K5900 Constipation, unspecified: Secondary | ICD-10-CM | POA: Diagnosis not present

## 2019-07-15 DIAGNOSIS — M48061 Spinal stenosis, lumbar region without neurogenic claudication: Secondary | ICD-10-CM | POA: Diagnosis not present

## 2019-07-15 DIAGNOSIS — K219 Gastro-esophageal reflux disease without esophagitis: Secondary | ICD-10-CM | POA: Diagnosis not present

## 2019-07-15 DIAGNOSIS — D631 Anemia in chronic kidney disease: Secondary | ICD-10-CM | POA: Diagnosis not present

## 2019-07-15 DIAGNOSIS — N184 Chronic kidney disease, stage 4 (severe): Secondary | ICD-10-CM | POA: Diagnosis not present

## 2019-07-17 ENCOUNTER — Inpatient Hospital Stay
Admission: EM | Admit: 2019-07-17 | Discharge: 2019-07-24 | DRG: 536 | Disposition: A | Discharge status: Skilled Nursing Facility | Payer: PPO | Attending: Internal Medicine | Admitting: Internal Medicine

## 2019-07-17 ENCOUNTER — Encounter: Payer: Self-pay | Admitting: Emergency Medicine

## 2019-07-17 ENCOUNTER — Emergency Department: Payer: PPO

## 2019-07-17 ENCOUNTER — Other Ambulatory Visit: Payer: Self-pay

## 2019-07-17 DIAGNOSIS — Q828 Other specified congenital malformations of skin: Secondary | ICD-10-CM

## 2019-07-17 DIAGNOSIS — R600 Localized edema: Secondary | ICD-10-CM | POA: Diagnosis not present

## 2019-07-17 DIAGNOSIS — N3592 Unspecified urethral stricture, female: Secondary | ICD-10-CM | POA: Diagnosis not present

## 2019-07-17 DIAGNOSIS — N179 Acute kidney failure, unspecified: Secondary | ICD-10-CM | POA: Diagnosis present

## 2019-07-17 DIAGNOSIS — S300XXA Contusion of lower back and pelvis, initial encounter: Secondary | ICD-10-CM | POA: Diagnosis not present

## 2019-07-17 DIAGNOSIS — S32592A Other specified fracture of left pubis, initial encounter for closed fracture: Secondary | ICD-10-CM | POA: Diagnosis not present

## 2019-07-17 DIAGNOSIS — R159 Full incontinence of feces: Secondary | ICD-10-CM | POA: Diagnosis present

## 2019-07-17 DIAGNOSIS — M2550 Pain in unspecified joint: Secondary | ICD-10-CM | POA: Diagnosis not present

## 2019-07-17 DIAGNOSIS — D631 Anemia in chronic kidney disease: Secondary | ICD-10-CM | POA: Diagnosis not present

## 2019-07-17 DIAGNOSIS — Z9102 Food additives allergy status: Secondary | ICD-10-CM

## 2019-07-17 DIAGNOSIS — Z20822 Contact with and (suspected) exposure to covid-19: Secondary | ICD-10-CM | POA: Diagnosis present

## 2019-07-17 DIAGNOSIS — R609 Edema, unspecified: Secondary | ICD-10-CM | POA: Diagnosis present

## 2019-07-17 DIAGNOSIS — Y846 Urinary catheterization as the cause of abnormal reaction of the patient, or of later complication, without mention of misadventure at the time of the procedure: Secondary | ICD-10-CM | POA: Diagnosis not present

## 2019-07-17 DIAGNOSIS — Z7401 Bed confinement status: Secondary | ICD-10-CM | POA: Diagnosis not present

## 2019-07-17 DIAGNOSIS — E871 Hypo-osmolality and hyponatremia: Secondary | ICD-10-CM | POA: Diagnosis present

## 2019-07-17 DIAGNOSIS — M533 Sacrococcygeal disorders, not elsewhere classified: Secondary | ICD-10-CM | POA: Diagnosis not present

## 2019-07-17 DIAGNOSIS — N39490 Overflow incontinence: Secondary | ICD-10-CM | POA: Diagnosis present

## 2019-07-17 DIAGNOSIS — M8448XA Pathological fracture, other site, initial encounter for fracture: Secondary | ICD-10-CM | POA: Diagnosis not present

## 2019-07-17 DIAGNOSIS — N139 Obstructive and reflux uropathy, unspecified: Secondary | ICD-10-CM | POA: Diagnosis not present

## 2019-07-17 DIAGNOSIS — R102 Pelvic and perineal pain: Secondary | ICD-10-CM | POA: Diagnosis not present

## 2019-07-17 DIAGNOSIS — Z7984 Long term (current) use of oral hypoglycemic drugs: Secondary | ICD-10-CM

## 2019-07-17 DIAGNOSIS — R5381 Other malaise: Secondary | ICD-10-CM | POA: Diagnosis not present

## 2019-07-17 DIAGNOSIS — E1122 Type 2 diabetes mellitus with diabetic chronic kidney disease: Secondary | ICD-10-CM | POA: Diagnosis present

## 2019-07-17 DIAGNOSIS — N189 Chronic kidney disease, unspecified: Secondary | ICD-10-CM | POA: Diagnosis present

## 2019-07-17 DIAGNOSIS — N183 Chronic kidney disease, stage 3 unspecified: Secondary | ICD-10-CM | POA: Diagnosis present

## 2019-07-17 DIAGNOSIS — R200 Anesthesia of skin: Secondary | ICD-10-CM | POA: Diagnosis not present

## 2019-07-17 DIAGNOSIS — M4808 Spinal stenosis, sacral and sacrococcygeal region: Secondary | ICD-10-CM | POA: Diagnosis not present

## 2019-07-17 DIAGNOSIS — Z03818 Encounter for observation for suspected exposure to other biological agents ruled out: Secondary | ICD-10-CM | POA: Diagnosis not present

## 2019-07-17 DIAGNOSIS — R52 Pain, unspecified: Secondary | ICD-10-CM | POA: Diagnosis not present

## 2019-07-17 DIAGNOSIS — E1165 Type 2 diabetes mellitus with hyperglycemia: Secondary | ICD-10-CM | POA: Diagnosis present

## 2019-07-17 DIAGNOSIS — E875 Hyperkalemia: Secondary | ICD-10-CM | POA: Diagnosis present

## 2019-07-17 DIAGNOSIS — E039 Hypothyroidism, unspecified: Secondary | ICD-10-CM | POA: Diagnosis present

## 2019-07-17 DIAGNOSIS — Z87442 Personal history of urinary calculi: Secondary | ICD-10-CM

## 2019-07-17 DIAGNOSIS — Z91041 Radiographic dye allergy status: Secondary | ICD-10-CM

## 2019-07-17 DIAGNOSIS — M5126 Other intervertebral disc displacement, lumbar region: Secondary | ICD-10-CM | POA: Diagnosis not present

## 2019-07-17 DIAGNOSIS — M48061 Spinal stenosis, lumbar region without neurogenic claudication: Secondary | ICD-10-CM | POA: Diagnosis not present

## 2019-07-17 DIAGNOSIS — K219 Gastro-esophageal reflux disease without esophagitis: Secondary | ICD-10-CM | POA: Diagnosis not present

## 2019-07-17 DIAGNOSIS — Z8619 Personal history of other infectious and parasitic diseases: Secondary | ICD-10-CM

## 2019-07-17 DIAGNOSIS — S32592D Other specified fracture of left pubis, subsequent encounter for fracture with routine healing: Secondary | ICD-10-CM

## 2019-07-17 DIAGNOSIS — M47816 Spondylosis without myelopathy or radiculopathy, lumbar region: Secondary | ICD-10-CM | POA: Diagnosis not present

## 2019-07-17 DIAGNOSIS — N184 Chronic kidney disease, stage 4 (severe): Secondary | ICD-10-CM | POA: Diagnosis not present

## 2019-07-17 DIAGNOSIS — S32512A Fracture of superior rim of left pubis, initial encounter for closed fracture: Secondary | ICD-10-CM | POA: Diagnosis not present

## 2019-07-17 DIAGNOSIS — I129 Hypertensive chronic kidney disease with stage 1 through stage 4 chronic kidney disease, or unspecified chronic kidney disease: Secondary | ICD-10-CM | POA: Diagnosis present

## 2019-07-17 DIAGNOSIS — I10 Essential (primary) hypertension: Secondary | ICD-10-CM | POA: Diagnosis not present

## 2019-07-17 DIAGNOSIS — T8384XA Pain from genitourinary prosthetic devices, implants and grafts, initial encounter: Secondary | ICD-10-CM | POA: Diagnosis not present

## 2019-07-17 DIAGNOSIS — M4800 Spinal stenosis, site unspecified: Secondary | ICD-10-CM | POA: Diagnosis not present

## 2019-07-17 DIAGNOSIS — Z9181 History of falling: Secondary | ICD-10-CM | POA: Diagnosis not present

## 2019-07-17 DIAGNOSIS — E1121 Type 2 diabetes mellitus with diabetic nephropathy: Secondary | ICD-10-CM

## 2019-07-17 DIAGNOSIS — Z882 Allergy status to sulfonamides status: Secondary | ICD-10-CM

## 2019-07-17 DIAGNOSIS — M5127 Other intervertebral disc displacement, lumbosacral region: Secondary | ICD-10-CM | POA: Diagnosis present

## 2019-07-17 DIAGNOSIS — R0902 Hypoxemia: Secondary | ICD-10-CM | POA: Diagnosis not present

## 2019-07-17 DIAGNOSIS — Z79899 Other long term (current) drug therapy: Secondary | ICD-10-CM

## 2019-07-17 DIAGNOSIS — Z886 Allergy status to analgesic agent status: Secondary | ICD-10-CM

## 2019-07-17 DIAGNOSIS — Z79890 Hormone replacement therapy (postmenopausal): Secondary | ICD-10-CM

## 2019-07-17 DIAGNOSIS — Z888 Allergy status to other drugs, medicaments and biological substances status: Secondary | ICD-10-CM

## 2019-07-17 DIAGNOSIS — M5418 Radiculopathy, sacral and sacrococcygeal region: Secondary | ICD-10-CM | POA: Diagnosis not present

## 2019-07-17 DIAGNOSIS — S3210XD Unspecified fracture of sacrum, subsequent encounter for fracture with routine healing: Secondary | ICD-10-CM | POA: Diagnosis not present

## 2019-07-17 DIAGNOSIS — M4807 Spinal stenosis, lumbosacral region: Secondary | ICD-10-CM | POA: Diagnosis not present

## 2019-07-17 DIAGNOSIS — M160 Bilateral primary osteoarthritis of hip: Secondary | ICD-10-CM | POA: Diagnosis not present

## 2019-07-17 LAB — CBC WITH DIFFERENTIAL/PLATELET
Abs Immature Granulocytes: 0.03 10*3/uL (ref 0.00–0.07)
Basophils Absolute: 0 10*3/uL (ref 0.0–0.1)
Basophils Relative: 1 %
Eosinophils Absolute: 0.1 10*3/uL (ref 0.0–0.5)
Eosinophils Relative: 1 %
HCT: 31 % — ABNORMAL LOW (ref 36.0–46.0)
Hemoglobin: 10.5 g/dL — ABNORMAL LOW (ref 12.0–15.0)
Immature Granulocytes: 0 %
Lymphocytes Relative: 21 %
Lymphs Abs: 1.9 10*3/uL (ref 0.7–4.0)
MCH: 31.1 pg (ref 26.0–34.0)
MCHC: 33.9 g/dL (ref 30.0–36.0)
MCV: 91.7 fL (ref 80.0–100.0)
Monocytes Absolute: 0.7 10*3/uL (ref 0.1–1.0)
Monocytes Relative: 8 %
Neutro Abs: 6.1 10*3/uL (ref 1.7–7.7)
Neutrophils Relative %: 69 %
Platelets: 535 10*3/uL — ABNORMAL HIGH (ref 150–400)
RBC: 3.38 MIL/uL — ABNORMAL LOW (ref 3.87–5.11)
RDW: 13.2 % (ref 11.5–15.5)
WBC: 8.8 10*3/uL (ref 4.0–10.5)
nRBC: 0 % (ref 0.0–0.2)

## 2019-07-17 LAB — COMPREHENSIVE METABOLIC PANEL
ALT: 15 U/L (ref 0–44)
AST: 22 U/L (ref 15–41)
Albumin: 3.9 g/dL (ref 3.5–5.0)
Alkaline Phosphatase: 106 U/L (ref 38–126)
Anion gap: 13 (ref 5–15)
BUN: 19 mg/dL (ref 8–23)
CO2: 24 mmol/L (ref 22–32)
Calcium: 9.5 mg/dL (ref 8.9–10.3)
Chloride: 90 mmol/L — ABNORMAL LOW (ref 98–111)
Creatinine, Ser: 1.21 mg/dL — ABNORMAL HIGH (ref 0.44–1.00)
GFR calc Af Amer: 47 mL/min — ABNORMAL LOW (ref >60)
GFR calc non Af Amer: 40 mL/min — ABNORMAL LOW (ref >60)
Glucose, Bld: 130 mg/dL — ABNORMAL HIGH (ref 70–99)
Potassium: 4.4 mmol/L (ref 3.5–5.1)
Sodium: 127 mmol/L — ABNORMAL LOW (ref 135–145)
Total Bilirubin: 0.7 mg/dL (ref 0.3–1.2)
Total Protein: 6.8 g/dL (ref 6.5–8.1)

## 2019-07-17 LAB — URINALYSIS, COMPLETE (UACMP) WITH MICROSCOPIC
Bacteria, UA: NONE SEEN
Bilirubin Urine: NEGATIVE
Glucose, UA: NEGATIVE mg/dL
Hgb urine dipstick: NEGATIVE
Ketones, ur: 5 mg/dL — AB
Leukocytes,Ua: NEGATIVE
Nitrite: NEGATIVE
Protein, ur: NEGATIVE mg/dL
Specific Gravity, Urine: 1.005 (ref 1.005–1.030)
WBC, UA: NONE SEEN WBC/hpf (ref 0–5)
pH: 8 (ref 5.0–8.0)

## 2019-07-17 LAB — CBC
HCT: 30 % — ABNORMAL LOW (ref 36.0–46.0)
Hemoglobin: 10.4 g/dL — ABNORMAL LOW (ref 12.0–15.0)
MCH: 30.9 pg (ref 26.0–34.0)
MCHC: 34.7 g/dL (ref 30.0–36.0)
MCV: 89 fL (ref 80.0–100.0)
Platelets: 401 10*3/uL — ABNORMAL HIGH (ref 150–400)
RBC: 3.37 MIL/uL — ABNORMAL LOW (ref 3.87–5.11)
RDW: 12.9 % (ref 11.5–15.5)
WBC: 8.7 10*3/uL (ref 4.0–10.5)
nRBC: 0 % (ref 0.0–0.2)

## 2019-07-17 LAB — GLUCOSE, CAPILLARY
Glucose-Capillary: 128 mg/dL — ABNORMAL HIGH (ref 70–99)
Glucose-Capillary: 105 mg/dL — ABNORMAL HIGH (ref 70–99)
Glucose-Capillary: 148 mg/dL — ABNORMAL HIGH (ref 70–99)

## 2019-07-17 LAB — SEDIMENTATION RATE: Sed Rate: 14 mm/hr (ref 0–30)

## 2019-07-17 MED ORDER — PANTOPRAZOLE SODIUM 40 MG PO TBEC
40.0000 mg | DELAYED_RELEASE_TABLET | Freq: Every day | ORAL | Status: DC
  Administered 2019-07-17 – 2019-07-24 (×8): 40 mg via ORAL
  Filled 2019-07-17 (×8): qty 1

## 2019-07-17 MED ORDER — CYCLOBENZAPRINE HCL 10 MG PO TABS
5.0000 mg | ORAL_TABLET | Freq: Three times a day (TID) | ORAL | Status: DC
  Administered 2019-07-18 – 2019-07-24 (×18): 5 mg via ORAL
  Filled 2019-07-17 (×21): qty 1

## 2019-07-17 MED ORDER — ONDANSETRON HCL 4 MG/2ML IJ SOLN: 4.0000 mg | Freq: Four times a day (QID) | INTRAMUSCULAR | Status: DC | PRN

## 2019-07-17 MED ORDER — VITAMIN D 25 MCG (1000 UNIT) PO TABS
1000.0000 [IU] | ORAL_TABLET | Freq: Every day | ORAL | Status: DC
  Administered 2019-07-18 – 2019-07-24 (×7): 1000 [IU] via ORAL
  Filled 2019-07-17 (×7): qty 1

## 2019-07-17 MED ORDER — ESCITALOPRAM OXALATE 10 MG PO TABS
5.0000 mg | ORAL_TABLET | Freq: Every day | ORAL | Status: DC
  Administered 2019-07-17 – 2019-07-24 (×8): 5 mg via ORAL
  Filled 2019-07-17 (×2): qty 0.5
  Filled 2019-07-17: qty 1
  Filled 2019-07-17 (×5): qty 0.5

## 2019-07-17 MED ORDER — SIMVASTATIN 40 MG PO TABS
40.0000 mg | ORAL_TABLET | Freq: Every day | ORAL | Status: DC
  Administered 2019-07-17 – 2019-07-23 (×7): 40 mg via ORAL
  Filled 2019-07-17 (×8): qty 1

## 2019-07-17 MED ORDER — INSULIN ASPART 100 UNIT/ML ~~LOC~~ SOLN
0.0000 [IU] | Freq: Three times a day (TID) | SUBCUTANEOUS | Status: DC
  Filled 2019-07-17: qty 1

## 2019-07-17 MED ORDER — PLECANATIDE 3 MG PO TABS: 3.0000 mg | ORAL_TABLET | Freq: Every day | ORAL | Status: DC

## 2019-07-17 MED ORDER — ENSURE ENLIVE PO LIQD
237.0000 mL | Freq: Three times a day (TID) | ORAL | Status: DC
  Administered 2019-07-17 – 2019-07-24 (×14): 237 mL via ORAL

## 2019-07-17 MED ORDER — ENOXAPARIN SODIUM 30 MG/0.3ML ~~LOC~~ SOLN
30.0000 mg | SUBCUTANEOUS | Status: DC
  Administered 2019-07-17 – 2019-07-23 (×7): 30 mg via SUBCUTANEOUS
  Filled 2019-07-17 (×8): qty 0.3

## 2019-07-17 MED ORDER — CHLORHEXIDINE GLUCONATE CLOTH 2 % EX PADS
6.0000 | MEDICATED_PAD | Freq: Every day | CUTANEOUS | Status: DC
  Administered 2019-07-19 – 2019-07-23 (×4): 6 via TOPICAL

## 2019-07-17 MED ORDER — ONDANSETRON HCL 4 MG PO TABS: 4.0000 mg | ORAL_TABLET | Freq: Four times a day (QID) | ORAL | Status: DC | PRN

## 2019-07-17 MED ORDER — MORPHINE SULFATE (PF) 2 MG/ML IV SOLN
2.0000 mg | INTRAVENOUS | Status: DC | PRN
  Administered 2019-07-17 – 2019-07-18 (×4): 2 mg via INTRAVENOUS
  Filled 2019-07-17 (×5): qty 1

## 2019-07-17 MED ORDER — LEVOTHYROXINE SODIUM 50 MCG PO TABS
25.0000 ug | ORAL_TABLET | Freq: Every day | ORAL | Status: DC
  Administered 2019-07-18 – 2019-07-24 (×7): 25 ug via ORAL
  Filled 2019-07-17 (×5): qty 1
  Filled 2019-07-17: qty 0.5
  Filled 2019-07-17 (×2): qty 1

## 2019-07-17 MED ORDER — LISINOPRIL 20 MG PO TABS
40.0000 mg | ORAL_TABLET | Freq: Every day | ORAL | Status: DC
  Administered 2019-07-17 – 2019-07-21 (×5): 40 mg via ORAL
  Filled 2019-07-17 (×3): qty 2
  Filled 2019-07-17: qty 4
  Filled 2019-07-17: qty 2

## 2019-07-17 NOTE — H&P (Signed)
History and Physical    Lisa Mills:[id number] DOB: [birth year] DOA: 07/17/2019  PCP: Olin Hauser, DO   Patient coming from: Home  I have personally briefly reviewed patient's old medical records in [place]  Chief Complaint: Pain in the tailbone area                                Incontinence of urine and feces  HPI: Lisa Mills is a 86 y.o. female  with a known history of hypertension and GERD as well as a recent mechanical fall with transverse S1 fracture and bilateral sacral ala fracture for which she underwent sacral plasty by Dr. Kyla Balzarine and was discharged home.  The patient was readmitted 06/21 - 06/24 for severe constipation with no bowel movements for at least 7 days as well as persistent pain in the sacral area. Severe constipation was thought to be secondary to narcotic use and patient was discharged home with home PT and OT. She presents again today with a family member with complaints of persistent sacral pain, fecal urinary incontinence as well as numbness in the pelvic area.  During her hospitalization 2 weeks ago she was found to have urinary retention and required a Foley catheter.  This was discontinued prior to her discharge.  Patient states she can only void standing up and most of the time is incontinent of urine and feces with family members cleaning up after her.  She also complains of weakness and has difficulty with ambulation due to severe pain in her lower back. She denies having any recent trauma, no falls, no dizziness or lightheadedness. MRI of the pelvis shows no acute abnormality. Status post bilateral sacroplasty. Left superior and inferior pubic ramus fractures as seen on prior CT. Small hematomas in the left operator internus obturator externus have slightly decreased in size since the prior exam. Fluid at the hamstring origins bilaterally compatible with strain or tendinosis, more notable on the left. MRI of the lumbar spine  showed Bilateral sacral insufficiency fractures with surrounding marrow edema. Diffuse lumbar spine spondylosis as described above. At L4-5 there is a broad-based disc bulge. Severe bilateral facet arthropathy. Mild spinal stenosis. Bilateral lateral recess stenosis. Moderate bilateral foraminal stenosis. At L2-3 there is a broad-based disc bulge eccentric towards the left. Mild bilateral facet arthropathy. Moderate left foraminal stenosis. Mild right foraminal stenosis. Left subarticular recess stenosis. Mild spinal stenosis.  At L5-S1 there is a broad-based disc bulge with a small right paracentral disc protrusion. Mild bilateral foraminal stenosis.    ED Course: Patient is an 86 year old Caucasian female who presents to the emergency room with complaints of lower extremity weakness, urinary and fecal incontinence as well as worsening pain in the sacral area. Patient is status post recent sacral plasty for bilateral sacral alar fratures. She will be admitted to the hospital for further evaluation.  Review of Systems: As per HPI otherwise 10 point review of systems negative.    Past Medical History:  Diagnosis Date  . Dermatophytosis of nail   . GERD (gastroesophageal reflux disease)   . History of shingles   . Hypertension   . Keratoderma     Past Surgical History:  Procedure Laterality Date  . BREAST BIOPSY Right 09/30/2017   Affirm bx-calcs ( X clip),benign  . BREAST EXCISIONAL BIOPSY Right    neg  . cataracts    . COLONOSCOPY W/ POLYPECTOMY    .  COLONOSCOPY WITH PROPOFOL N/A 02/10/2017   Procedure: COLONOSCOPY WITH PROPOFOL;  Surgeon: Manya Silvas, MD;  Location: [facility] ENDOSCOPY;  Service: Endoscopy;  Laterality: N/A;  . ESOPHAGOGASTRODUODENOSCOPY (EGD) WITH PROPOFOL N/A 02/10/2017   Procedure: ESOPHAGOGASTRODUODENOSCOPY (EGD) WITH PROPOFOL;  Surgeon: Manya Silvas, MD;  Location: [facility] ENDOSCOPY;  Service: Endoscopy;  Laterality: N/A;  . EYE SURGERY     bilateral  cataracts  . HAND SURGERY    . KIDNEY STONE SURGERY    . SACROPLASTY N/A 07/06/2019   Procedure: SACROPLASTY;  Surgeon: Hessie Knows, MD;  Location: [facility] ORS;  Service: Orthopedics;  Laterality: N/A;  . TONSILECTOMY, ADENOIDECTOMY, BILATERAL MYRINGOTOMY AND TUBES    . TONSILLECTOMY    . TRIGGER FINGER RELEASE     Left ring finger  . tubercular peritonitis       reports that she has never smoked. She has never used smokeless tobacco. She reports previous alcohol use. She reports that she does not use drugs.  Allergies  Allergen Reactions  . Aspirin Other (See Comments)    Burns stomach  . Conray [Iothalamate] Hives    IV dye conray-400  . Dye Fdc Red [Red Dye] Hives  . Sulfa Antibiotics     Unknown Reaction, not used in years  . Prednisone     Indigestion     Family History  Adopted: Yes  Problem Relation Age of Onset  . Breast cancer Neg Hx      Prior to Admission medications   Medication Sig Start Date End Date Taking? Authorizing Provider  Blood Glucose Monitoring Suppl ([place]) w/Device KIT Use glucometer to check blood sugar daily. 02/25/16   Olin Hauser, DO  cholecalciferol (VITAMIN D) 1000 units tablet Take 1,000 Units by mouth daily.    [provider]  cyclobenzaprine (FLEXERIL) 5 MG tablet Take 1 tablet (5 mg total) by mouth 3 (three) times daily. 07/07/19   Lorella Nimrod, MD  escitalopram (LEXAPRO) 5 MG tablet TAKE 1 TABLET BY MOUTH ONCE DAILY WITH FOOD Patient taking differently: Take 5 mg by mouth daily.  08/17/18   Karamalegos, Devonne Doughty, DO  feeding supplement, ENSURE ENLIVE, (ENSURE ENLIVE) LIQD Take 237 mLs by mouth 3 (three) times daily. 07/13/19   Mikhail, Velta Addison, DO  glimepiride (AMARYL) 4 MG tablet Take 0.5 tablets (2 mg total) by mouth daily. With meal, either breakfast or dinner. 03/15/19   Karamalegos, Devonne Doughty, DO  glucose blood (ONE TOUCH ULTRA TEST) test strip CHECK BLOOD SUGAR UP TO 2 TIMES A DAY. 03/07/18    Karamalegos, Devonne Doughty, DO  levothyroxine (SYNTHROID) 25 MCG tablet TAKE 1 TABLET BY MOUTH ONCE DAILY ON AN EMPTY STOMACH. WAIT 30 MINUTES BEFORE TAKING OTHER MEDS. 03/10/19   Olin Hauser, DO  lisinopril (ZESTRIL) 40 MG tablet Take 1 tablet (40 mg total) by mouth daily. 07/14/19   Mikhail, Velta Addison, DO  omeprazole (PRILOSEC) 20 MG capsule TAKE 1 CAPSULE BY MOUTH ONCE DAILY 02/06/19   Karamalegos, Devonne Doughty, DO  ONETOUCH DELICA LANCETS 70Y MISC 1 each by Does not apply route 3 (three) times daily. Dx:E11.9 04/14/16   Karamalegos, Devonne Doughty, DO  oxyCODONE (OXY IR/ROXICODONE) 5 MG immediate release tablet Take 1 tablet (5 mg total) by mouth every 4 (four) hours as needed for severe pain. 07/07/19   Lorella Nimrod, MD  Plecanatide (TRULANCE) 3 MG TABS Take 3 mg by mouth daily. 07/13/19   Mikhail, Velta Addison, DO  senna (SENOKOT) 8.6 MG TABS tablet Take 1 tablet (  8.6 mg total) by mouth 2 (two) times daily. 07/07/19   Lorella Nimrod, MD  simvastatin (ZOCOR) 40 MG tablet TAKE 1 TABLET BY MOUTH AT BEDTIME 01/19/19   Karamalegos, Devonne Doughty, DO  traMADol (ULTRAM) 50 MG tablet Take 50 mg by mouth every 12 (twelve) hours as needed for moderate pain.    [provider]    Physical Exam: Vitals:   07/17/19 1400 07/17/19 1405 07/17/19 1430 07/17/19 1530  BP:  (!) 150/65 (!) 161/77 (!) 157/69  Pulse: 87 83 86 86  Resp:      Temp:      TempSrc:      SpO2: 97% 97% 94% 96%  Weight:      Height:         Vitals:   07/17/19 1400 07/17/19 1405 07/17/19 1430 07/17/19 1530  BP:  (!) 150/65 (!) 161/77 (!) 157/69  Pulse: 87 83 86 86  Resp:      Temp:      TempSrc:      SpO2: 97% 97% 94% 96%  Weight:      Height:        Constitutional: NAD, alert and oriented x 3. Appears comfortable and in no distress while she is not moving Eyes: PERRL, lids and conjunctivae normal ENMT: Mucous membranes are moist.  Neck: normal, supple, no masses, no thyromegaly Respiratory: clear to auscultation  bilaterally, no wheezing, no crackles. Normal respiratory effort. No accessory muscle use.  Cardiovascular: Regular rate and rhythm, no murmurs / rubs / gallops. No extremity edema. 2+ pedal pulses. No carotid bruits.  Abdomen: no tenderness, no masses palpated. No hepatosplenomegaly. Bowel sounds positive.  Musculoskeletal: no clubbing / cyanosis. No joint deformity upper and lower extremities.  Skin: no rashes, lesions, ulcers.  Neurologic: No gross focal neurologic deficit. Able to move all extremities Psychiatric: Normal mood and affect.   Labs on Admission: I have personally reviewed following labs and imaging studies  CBC: Recent Labs  Lab 07/11/19 0432 07/12/19 0618 07/13/19 0541 07/17/19 0643  WBC 9.3 9.6 8.8 8.8  8.7  NEUTROABS  --   --   --  6.1  HGB 9.7* 10.2* 10.3* 10.5*  10.4*  HCT 28.9* 30.2* 29.6* 31.0*  30.0*  MCV 92.3 91.0 88.9 91.7  89.0  PLT 362 388 403* 535*  559*   Basic Metabolic Panel: Recent Labs  Lab 07/11/19 0432 07/12/19 0618 07/13/19 0541 07/17/19 0643  NA 132* 131* 132* 127*  K 3.9 3.7 4.7 4.4  CL 99 96* 95* 90*  CO2 '26 25 28 24  '$ GLUCOSE 99 116* 129* 130*  BUN '12 12 14 19  '$ CREATININE 1.10* 1.12* 1.09* 1.21*  CALCIUM 8.5* 9.0 9.1 9.5   GFR: Estimated Creatinine Clearance: 26 mL/min (A) (by C-G formula based on SCr of 1.21 mg/dL (H)). Liver Function Tests: Recent Labs  Lab 07/17/19 0643  AST 22  ALT 15  ALKPHOS 106  BILITOT 0.7  PROT 6.8  ALBUMIN 3.9   No results for input(s): LIPASE, AMYLASE in the last 168 hours. No results for input(s): AMMONIA in the last 168 hours. Coagulation Profile: No results for input(s): INR, PROTIME in the last 168 hours. Cardiac Enzymes: No results for input(s): CKTOTAL, CKMB, CKMBINDEX, TROPONINI in the last 168 hours. BNP (last 3 results) No results for input(s): PROBNP in the last 8760 hours. HbA1C: No results for input(s): HGBA1C in the last 72 hours. CBG: Recent Labs  Lab  07/12/19 1652 07/12/19 2113 07/13/19 7416 07/13/19  1127 07/17/19 1155  GLUCAP 198* 176* 122* 185* 128*   Lipid Profile: No results for input(s): CHOL, HDL, LDLCALC, TRIG, CHOLHDL, LDLDIRECT in the last 72 hours. Thyroid Function Tests: No results for input(s): TSH, T4TOTAL, FREET4, T3FREE, THYROIDAB in the last 72 hours. Anemia Panel: No results for input(s): VITAMINB12, FOLATE, FERRITIN, TIBC, IRON, RETICCTPCT in the last 72 hours. Urine analysis:    Component Value Date/Time   COLORURINE STRAW (A) 07/17/2019 1143   APPEARANCEUR CLEAR (A) 07/17/2019 1143   APPEARANCEUR clear 03/29/2017 0000   LABSPEC 1.005 07/17/2019 1143   PHURINE 8.0 07/17/2019 1143   GLUCOSEU NEGATIVE 07/17/2019 1143   HGBUR NEGATIVE 07/17/2019 1143   BILIRUBINUR NEGATIVE 07/17/2019 1143   BILIRUBINUR Negative (A) 03/29/2017 0000   KETONESUR 5 (A) 07/17/2019 1143   PROTEINUR NEGATIVE 07/17/2019 1143   UROBILINOGEN Normal 03/29/2017 0000   NITRITE NEGATIVE 07/17/2019 1143   LEUKOCYTESUR NEGATIVE 07/17/2019 1143    Radiological Exams on Admission: MR LUMBAR SPINE WO CONTRAST  Result Date: 07/17/2019 CLINICAL DATA:  Low back pain, cauda equina syndrome. Numbness in the perineum. EXAM: MRI LUMBAR SPINE WITHOUT CONTRAST TECHNIQUE: Multiplanar, multisequence MR imaging of the lumbar spine was performed. No intravenous contrast was administered. COMPARISON:  CT lumbar spine 07/04/2019 FINDINGS: Segmentation:  Standard. Alignment: 3 mm retrolisthesis of L2 on L3 and L3 on L4. Minimal grade 1 anterolisthesis of L4 on L5. Vertebrae:  No fracture, evidence of discitis, or bone lesion. Other: Marrow edema in the sacral ala bilaterally with linear components consistent with bilateral sacral insufficiency fractures. Conus medullaris and cauda equina: Conus extends to the T12 L level. Conus and cauda equina appear normal. Paraspinal and other soft tissues: No acute paraspinal abnormality. Right renal cyst. Disc levels: Disc  spaces: Degenerative disease with disc height loss at L2-3, L3-4 and L5-S1. T12-L1: No significant disc bulge. No evidence of neural foraminal stenosis. No central canal stenosis. L1-L2: No significant disc bulge. Mild left facet arthropathy. Mild left foraminal stenosis. No right foraminal stenosis. No central canal stenosis. L2-L3: Broad-based disc bulge eccentric towards the left. Mild bilateral facet arthropathy. Moderate left foraminal stenosis. Mild right foraminal stenosis. Left subarticular recess stenosis. Mild spinal stenosis. L3-L4: Mild broad-based disc bulge. Mild bilateral facet arthropathy. Moderate bilateral foraminal stenosis. No central canal stenosis. L4-L5: Broad-based disc bulge. Severe bilateral facet arthropathy. Mild spinal stenosis. Bilateral lateral recess stenosis. Moderate bilateral foraminal stenosis. L5-S1: Broad-based disc bulge with a small right paracentral disc protrusion. Mild bilateral foraminal stenosis. No central canal stenosis. IMPRESSION: 1. Bilateral sacral insufficiency fractures with surrounding marrow edema. 2. Diffuse lumbar spine spondylosis as described above. 3. At L4-5 there is a broad-based disc bulge. Severe bilateral facet arthropathy. Mild spinal stenosis. Bilateral lateral recess stenosis. Moderate bilateral foraminal stenosis. 4. At L2-3 there is a broad-based disc bulge eccentric towards the left. Mild bilateral facet arthropathy. Moderate left foraminal stenosis. Mild right foraminal stenosis. Left subarticular recess stenosis. Mild spinal stenosis. 5. At L5-S1 there is a broad-based disc bulge with a small right paracentral disc protrusion. Mild bilateral foraminal stenosis. Electronically Signed   By: Kathreen Devoid   On: 07/17/2019 13:45   MR PELVIS WO CONTRAST  Result Date: 07/17/2019 CLINICAL DATA:  Worsening pelvic pain. History of a fall in May, 2021 resulting in sacral fractures and left pubic rami fractures. The patient underwent sacroplasty  07/06/2019. EXAM: MRI PELVIS WITHOUT CONTRAST TECHNIQUE: Multiplanar, multisequence MR imaging of the pelvis was performed. No intravenous contrast was administered. COMPARISON:  CT of  the pelvis 07/04/2019. FINDINGS: Bones/Joint/Cartilage The patient has undergone bilateral sacroplasty since the prior examination. There is residual marrow edema in the sacrum due to the patient's fractures. Also again seen are left superior and inferior pubic ramus fractures with surrounding marrow edema. No acute fracture is identified. Lower lumbar degenerative change is noted. There is mild degenerative disease about the hips, more notable on the right. No joint effusion. No worrisome marrow lesion. Ligaments Intact. Muscles and Tendons There is some edema in the piriformis muscles bilaterally. A hematoma in the left obturator internus measures 2.9 cm AP x 2.1 cm transverse x 1.7 cm craniocaudal, slightly decreased since the prior exam. A smaller hematoma is seen in the left obturator externus. There is some fluid at the hamstring origins bilaterally compatible with strain or tendinosis. Soft tissues Imaged intrapelvic contents demonstrate no acute or focal abnormality. IMPRESSION: No acute abnormality. Status post bilateral sacroplasty. Left superior and inferior pubic ramus fractures as seen on prior CT. Small hematomas in the left operator internus obturator externus have slightly decreased in size since the prior exam. Fluid at the hamstring origins bilaterally compatible with strain or tendinosis, more notable on the left. Electronically Signed   By: Inge Rise M.D.   On: 07/17/2019 14:36    EKG: Independently reviewed.   Assessment/Plan Principal Problem:   Sacral pain Active Problems:   Type 2 diabetes mellitus with stage 4 chronic kidney disease (HCC)   Hypothyroidism   GERD (gastroesophageal reflux disease)   Anemia in chronic kidney disease (CKD)    Sacral pain Patient is status post recent sacral  plasty for bilateral  Sacral alar fractures She presents for evaluation of worsening pain in the sacral area associated with urinary and fecal incontinence as well as numbness of the pelvic area Pain control We will request neurosurgery consult We will consult orthopedic surgery Place patient on fall precautions    Type 2 diabetes mellitus with Complications of stage IV chronic kidney disease Maintain consistent carbohydrate diet Glycemic control with sliding scale insulin   Anemia of chronic kidney disease H&H is stable    Hyponatremia Unclear etiology and may be related to SSRI use Stable and patient is asymptomatic   Hypertension Blood pressure stable on lisinopril   Hypothyroidism Continue Synthroid   Depression  Continue Lexapro      DVT prophylaxis: Heparin  Code Status: Full code Family Communication: Greater than 50% of time spent discussing plan of care with patient and family member at the bedside. All questions and concerns have been addressed. She verbalizes understanding and agrees with the plan.  Disposition Plan: Back to previous home environment Consults called: Neurosurgery/orthopedics    Semir Brill MD [facility]     07/17/2019, 4:04 PM

## 2019-07-17 NOTE — ED Provider Notes (Signed)
[facility] Emergency Department Provider Note   ____________________________________________   First MD Initiated Contact with Patient 07/17/19 1034     (approximate)  I have reviewed the triage vital signs and the nursing notes.   HISTORY  Chief Complaint Pelvic Pain    HPI Lisa Mills is a 86 y.o. female who reports that she fell about 6 weeks ago.  After about 2 weeks she went to see orthopedics for worsening pain.  He did a sacral plasty and glued sacral fractures together with bone glue.  Since then she has had increasing pelvic pain.  She is also having urinary incontinence and she will start leaking urine when she stands up and leak until she gets to the toilet when she sits down she is empty and cannot urinate anymore.  She complains of increasing pain in the sacral area especially with sitting.  She is able to walk.  She is also having some incontinence of stool as well.  She says her perineum is numb.  She came in about 2 weeks ago with constipation.         Past Medical History:  Diagnosis Date  . Dermatophytosis of nail   . GERD (gastroesophageal reflux disease)   . History of shingles   . Hypertension   . Keratoderma     Patient Active Problem List   Diagnosis Date Noted  . Sacral pain   . Radicular pain of sacrum 07/11/2019  . Sacral fracture, closed ([place]) 07/04/2019  . Fall 07/04/2019  . Bilateral pubic rami fractures, sequela 07/04/2019  . Background diabetic retinopathy ([place]) 01/23/2019  . Schatzki's ring 03/04/2017  . Post herpetic neuralgia 10/06/2016  . Hx of colonic polyps 09/22/2016  . Osteopenia 09/02/2016  . Anxiety associated with depression 08/25/2016  . Trapezius muscle spasm 05/25/2016  . Intermittent left lower quadrant abdominal pain 03/16/2016  . Anemia in chronic kidney disease (CKD) 02/26/2016  . Type 2 diabetes mellitus with stage 4 chronic kidney disease ([place]) 02/25/2016  . Hypothyroidism 02/25/2016    . Constipation 02/25/2016  . GERD (gastroesophageal reflux disease) 02/25/2016  . Hyperlipidemia associated with type 2 diabetes mellitus ([place]) 02/25/2016  . DJD (degenerative joint disease) of cervical spine 02/25/2016  . Osteoarthritis of multiple joints 02/25/2016  . Hiatal hernia 02/25/2016  . CKD stage 3 due to type 2 diabetes mellitus ([place]) 02/25/2016  . Chronic kidney disease, unspecified 09/05/2013  . Benign hypertension with CKD (chronic kidney disease) stage III 09/05/2013  . CMC arthritis, thumb, degenerative 06/15/2013  . Onychomycosis due to dermatophyte 05/25/2013  . Acquired keratoderma 05/25/2013    Past Surgical History:  Procedure Laterality Date  . BREAST BIOPSY Right 09/30/2017   Affirm bx-calcs ( X clip),benign  . BREAST EXCISIONAL BIOPSY Right    neg  . cataracts    . COLONOSCOPY W/ POLYPECTOMY    . COLONOSCOPY WITH PROPOFOL N/A 02/10/2017   Procedure: COLONOSCOPY WITH PROPOFOL;  Surgeon: Manya Silvas, MD;  Location: [facility] ENDOSCOPY;  Service: Endoscopy;  Laterality: N/A;  . ESOPHAGOGASTRODUODENOSCOPY (EGD) WITH PROPOFOL N/A 02/10/2017   Procedure: ESOPHAGOGASTRODUODENOSCOPY (EGD) WITH PROPOFOL;  Surgeon: Manya Silvas, MD;  Location: [facility] ENDOSCOPY;  Service: Endoscopy;  Laterality: N/A;  . EYE SURGERY     bilateral cataracts  . HAND SURGERY    . KIDNEY STONE SURGERY    . SACROPLASTY N/A 07/06/2019   Procedure: SACROPLASTY;  Surgeon: Hessie Knows, MD;  Location: [facility] ORS;  Service: Orthopedics;  Laterality: N/A;  .  TONSILECTOMY, ADENOIDECTOMY, BILATERAL MYRINGOTOMY AND TUBES    . TONSILLECTOMY    . TRIGGER FINGER RELEASE     Left ring finger  . tubercular peritonitis      Prior to Admission medications   Medication Sig Start Date End Date Taking? Authorizing Provider  Blood Glucose Monitoring Suppl ([place]) w/Device KIT Use glucometer to check blood sugar daily. 02/25/16   Olin Hauser, DO  cholecalciferol (VITAMIN D)  1000 units tablet Take 1,000 Units by mouth daily.    [provider]  cyclobenzaprine (FLEXERIL) 5 MG tablet Take 1 tablet (5 mg total) by mouth 3 (three) times daily. 07/07/19   Lorella Nimrod, MD  escitalopram (LEXAPRO) 5 MG tablet TAKE 1 TABLET BY MOUTH ONCE DAILY WITH FOOD Patient taking differently: Take 5 mg by mouth daily.  08/17/18   Karamalegos, Devonne Doughty, DO  feeding supplement, ENSURE ENLIVE, (ENSURE ENLIVE) LIQD Take 237 mLs by mouth 3 (three) times daily. 07/13/19   Mikhail, Velta Addison, DO  glimepiride (AMARYL) 4 MG tablet Take 0.5 tablets (2 mg total) by mouth daily. With meal, either breakfast or dinner. 03/15/19   Karamalegos, Devonne Doughty, DO  glucose blood (ONE TOUCH ULTRA TEST) test strip CHECK BLOOD SUGAR UP TO 2 TIMES A DAY. 03/07/18   Karamalegos, Devonne Doughty, DO  levothyroxine (SYNTHROID) 25 MCG tablet TAKE 1 TABLET BY MOUTH ONCE DAILY ON AN EMPTY STOMACH. WAIT 30 MINUTES BEFORE TAKING OTHER MEDS. 03/10/19   Olin Hauser, DO  lisinopril (ZESTRIL) 40 MG tablet Take 1 tablet (40 mg total) by mouth daily. 07/14/19   Mikhail, Velta Addison, DO  omeprazole (PRILOSEC) 20 MG capsule TAKE 1 CAPSULE BY MOUTH ONCE DAILY 02/06/19   Karamalegos, Devonne Doughty, DO  ONETOUCH DELICA LANCETS 02H MISC 1 each by Does not apply route 3 (three) times daily. Dx:E11.9 04/14/16   Karamalegos, Devonne Doughty, DO  oxyCODONE (OXY IR/ROXICODONE) 5 MG immediate release tablet Take 1 tablet (5 mg total) by mouth every 4 (four) hours as needed for severe pain. 07/07/19   Lorella Nimrod, MD  Plecanatide (TRULANCE) 3 MG TABS Take 3 mg by mouth daily. 07/13/19   Mikhail, Velta Addison, DO  senna (SENOKOT) 8.6 MG TABS tablet Take 1 tablet (8.6 mg total) by mouth 2 (two) times daily. 07/07/19   Lorella Nimrod, MD  simvastatin (ZOCOR) 40 MG tablet TAKE 1 TABLET BY MOUTH AT BEDTIME 01/19/19   Karamalegos, Devonne Doughty, DO  traMADol (ULTRAM) 50 MG tablet Take 50 mg by mouth every 12 (twelve) hours as needed for moderate pain.     [provider]    Allergies Aspirin, Conray [iothalamate], Dye fdc red [red dye], Sulfa antibiotics, and Prednisone  Family History  Adopted: Yes  Problem Relation Age of Onset  . Breast cancer Neg Hx     Social History Social History   Tobacco Use  . Smoking status: Never Smoker  . Smokeless tobacco: Never Used  Vaping Use  . Vaping Use: Never used  Substance Use Topics  . Alcohol use: Not Currently  . Drug use: No    Review of Systems  Constitutional: No fever/chills Eyes: No visual changes. ENT: No sore throat. Cardiovascular: Denies chest pain. Respiratory: Denies shortness of breath. Gastrointestinal: No abdominal pain.  No nausea, no vomiting.  No diarrhea.  No constipation. Genitourinary: Negative for dysuria. Musculoskeletal: Negative for back pain. Skin: Negative for rash. Neurological: Negative for headaches, focal weakness   ____________________________________________   PHYSICAL EXAM:  VITAL SIGNS: ED Triage  Vitals  Enc Vitals Group     BP 07/17/19 0643 (!) 124/99     Pulse Rate 07/17/19 0643 82     Resp 07/17/19 0643 18     Temp 07/17/19 0643 98.8 F (37.1 C)     Temp Source 07/17/19 0643 Oral     SpO2 07/17/19 0643 99 %     Weight 07/17/19 0641 130 lb (59 kg)     Height 07/17/19 0641 4' 11.5" (1.511 m)     Head Circumference --      Peak Flow --      Pain Score 07/17/19 0640 1     Pain Loc --      Pain Edu? --      Excl. in GC? --     Constitutional: Alert and oriented. Well appearing and in no acute distress.  She is uncomfortable if she moves. Eyes: Conjunctivae are normal.  Head: Atraumatic. Nose: No congestion/rhinnorhea. Mouth/Throat: Mucous membranes are moist.  Oropharynx non-erythematous. Neck: No stridor.  Cardiovascular: Normal rate, regular rhythm. Grossly normal heart sounds.  Good peripheral circulation. Respiratory: Normal respiratory effort.  No retractions. Lungs CTAB. Gastrointestinal: Soft and nontender.  No distention. No abdominal bruits. No CVA tenderness. Rectal: Patient has rectal tone is not the best but there is some. Musculoskeletal: No lower extremity tenderness nor edema.  No joint effusions. Neurologic:  Normal speech and language. No gross focal neurologic deficits are appreciated. No gait instability. Skin:  Skin is warm, dry and intact. No rash noted. Psychiatric: Mood and affect are normal. Speech and behavior are normal.  ____________________________________________   LABS (all labs ordered are listed, but only abnormal results are displayed)  Labs Reviewed  CBC - Abnormal; Notable for the following components:      Result Value   RBC 3.37 (*)    Hemoglobin 10.4 (*)    HCT 30.0 (*)    Platelets 401 (*)    All other components within normal limits  COMPREHENSIVE METABOLIC PANEL - Abnormal; Notable for the following components:   Sodium 127 (*)    Chloride 90 (*)    Glucose, Bld 130 (*)    Creatinine, Ser 1.21 (*)    GFR calc non Af Amer 40 (*)    GFR calc Af Amer 47 (*)    All other components within normal limits  URINALYSIS, COMPLETE (UACMP) WITH MICROSCOPIC - Abnormal; Notable for the following components:   Color, Urine STRAW (*)    APPearance CLEAR (*)    Ketones, ur 5 (*)    All other components within normal limits  CBC WITH DIFFERENTIAL/PLATELET - Abnormal; Notable for the following components:   RBC 3.38 (*)    Hemoglobin 10.5 (*)    HCT 31.0 (*)    Platelets 535 (*)    All other components within normal limits  GLUCOSE, CAPILLARY - Abnormal; Notable for the following components:   Glucose-Capillary 128 (*)    All other components within normal limits  SEDIMENTATION RATE   ____________________________________________  EKG   ____________________________________________  RADIOLOGY  ED MD interpretation: MRI read by radiology shows some spinal narrowing and the pelvic changes as described below.  Official radiology report(s): MR LUMBAR SPINE  WO CONTRAST  Result Date: 07/17/2019 CLINICAL DATA:  Low back pain, cauda equina syndrome. Numbness in the perineum. EXAM: MRI LUMBAR SPINE WITHOUT CONTRAST TECHNIQUE: Multiplanar, multisequence MR imaging of the lumbar spine was performed. No intravenous contrast was administered. COMPARISON:  CT lumbar spine 07/04/2019 FINDINGS: Segmentation:  Standard. Alignment: 3 mm retrolisthesis of L2 on L3 and L3 on L4. Minimal grade 1 anterolisthesis of L4 on L5. Vertebrae:  No fracture, evidence of discitis, or bone lesion. Other: Marrow edema in the sacral ala bilaterally with linear components consistent with bilateral sacral insufficiency fractures. Conus medullaris and cauda equina: Conus extends to the T12 L level. Conus and cauda equina appear normal. Paraspinal and other soft tissues: No acute paraspinal abnormality. Right renal cyst. Disc levels: Disc spaces: Degenerative disease with disc height loss at L2-3, L3-4 and L5-S1. T12-L1: No significant disc bulge. No evidence of neural foraminal stenosis. No central canal stenosis. L1-L2: No significant disc bulge. Mild left facet arthropathy. Mild left foraminal stenosis. No right foraminal stenosis. No central canal stenosis. L2-L3: Broad-based disc bulge eccentric towards the left. Mild bilateral facet arthropathy. Moderate left foraminal stenosis. Mild right foraminal stenosis. Left subarticular recess stenosis. Mild spinal stenosis. L3-L4: Mild broad-based disc bulge. Mild bilateral facet arthropathy. Moderate bilateral foraminal stenosis. No central canal stenosis. L4-L5: Broad-based disc bulge. Severe bilateral facet arthropathy. Mild spinal stenosis. Bilateral lateral recess stenosis. Moderate bilateral foraminal stenosis. L5-S1: Broad-based disc bulge with a small right paracentral disc protrusion. Mild bilateral foraminal stenosis. No central canal stenosis. IMPRESSION: 1. Bilateral sacral insufficiency fractures with surrounding marrow edema. 2. Diffuse  lumbar spine spondylosis as described above. 3. At L4-5 there is a broad-based disc bulge. Severe bilateral facet arthropathy. Mild spinal stenosis. Bilateral lateral recess stenosis. Moderate bilateral foraminal stenosis. 4. At L2-3 there is a broad-based disc bulge eccentric towards the left. Mild bilateral facet arthropathy. Moderate left foraminal stenosis. Mild right foraminal stenosis. Left subarticular recess stenosis. Mild spinal stenosis. 5. At L5-S1 there is a broad-based disc bulge with a small right paracentral disc protrusion. Mild bilateral foraminal stenosis. Electronically Signed   By: Kathreen Devoid   On: 07/17/2019 13:45   MR PELVIS WO CONTRAST  Result Date: 07/17/2019 CLINICAL DATA:  Worsening pelvic pain. History of a fall in May, 2021 resulting in sacral fractures and left pubic rami fractures. The patient underwent sacroplasty 07/06/2019. EXAM: MRI PELVIS WITHOUT CONTRAST TECHNIQUE: Multiplanar, multisequence MR imaging of the pelvis was performed. No intravenous contrast was administered. COMPARISON:  CT of the pelvis 07/04/2019. FINDINGS: Bones/Joint/Cartilage The patient has undergone bilateral sacroplasty since the prior examination. There is residual marrow edema in the sacrum due to the patient's fractures. Also again seen are left superior and inferior pubic ramus fractures with surrounding marrow edema. No acute fracture is identified. Lower lumbar degenerative change is noted. There is mild degenerative disease about the hips, more notable on the right. No joint effusion. No worrisome marrow lesion. Ligaments Intact. Muscles and Tendons There is some edema in the piriformis muscles bilaterally. A hematoma in the left obturator internus measures 2.9 cm AP x 2.1 cm transverse x 1.7 cm craniocaudal, slightly decreased since the prior exam. A smaller hematoma is seen in the left obturator externus. There is some fluid at the hamstring origins bilaterally compatible with strain or  tendinosis. Soft tissues Imaged intrapelvic contents demonstrate no acute or focal abnormality. IMPRESSION: No acute abnormality. Status post bilateral sacroplasty. Left superior and inferior pubic ramus fractures as seen on prior CT. Small hematomas in the left operator internus obturator externus have slightly decreased in size since the prior exam. Fluid at the hamstring origins bilaterally compatible with strain or tendinosis, more notable on the left. Electronically Signed   By: Inge Rise M.D.   On: 07/17/2019 14:36  ____________________________________________   PROCEDURES  Procedure(s) performed (including Critical Care):  Procedures   ____________________________________________   INITIAL IMPRESSION / ASSESSMENT AND PLAN / ED COURSE  Patient is hyponatremic at 127.  She is having difficulty controlling urine and bowel.  She is having increasing pain.  We will get her in the hospital.  Neurosurgery saw the patient does not feel that her symptoms are due to a neurosurgical cause.  This is very reassuring.  Hospitalist will consult with Dr. Youlanda Mighty and possibly urology as well.             ____________________________________________   FINAL CLINICAL IMPRESSION(S) / ED DIAGNOSES  Final diagnoses:  Pelvic pain in female     ED Discharge Orders    None       Note:  This document was prepared using Dragon voice recognition software and may include unintentional dictation errors.    Nena Polio, MD 07/17/19 [phone]

## 2019-07-17 NOTE — ED Triage Notes (Addendum)
Patient brought in by ems from home. Patient reports that she broke her pelvis three weeks ago and that she had surgery. Patient reports that she has had increase pain. Patient also states that she has had increase in incontinence.

## 2019-07-17 NOTE — ED Notes (Signed)
Patient transported to MRI 

## 2019-07-17 NOTE — ED Notes (Signed)
Hospitalist to bedside.

## 2019-07-17 NOTE — ED Notes (Signed)
Pt's brief checked and purewick readjusted.

## 2019-07-17 NOTE — Consult Note (Signed)
Referring Physician:  No referring provider defined for this encounter.  Primary Physician:  [name], DO  Chief Complaint:  Urinary retention  History of Present Illness: Lisa Mills is a 86 y.o. female who presents with the chief complaint of urinary retention.  She underwent a sacroplasty on 07/06/2019 with Dr. Rosita Kea.  She reported to the [facility] ED on 07/10/2019 for reports of severe constipation.  She also has a history of urinary frequency.  During that visit, urology was contacted due to urinary frequency, and she has a follow-up with urology in 2 weeks.    She reports that sometime since her sacroplasty she has developed urinary incontinence.  She reports that she has difficulty urinating until she stands up, and then she feels that the urine just "flows" out.  She reportedly said that she had genital numbness as well.  Neurosurgery was consulted for concern for cauda equina, given her urinary retention and genital numbness.  A lumbar MRI was completed which showed no evidence of cauda equina both by radiology official read as well as Dr. Adriana Simas, neurosurgery, evaluation of lumbar spine MRI.  On exam, she has full and equal strength to bilateral lower extremities.  Rectal tone is intact.  Although she says that she in the past has felt genital numbness, on exam, she is able to feel light touch as well as pinprick over the entire distribution of the rectum and genital area, with the exception of a small spot on her right labia where light touch sensation is decreased, however pinprick sensation is intact.  On repeat exam of the area, she is able to feel soft touch to the right labia.  [name] has no symptoms of cervical myelopathy.  The symptoms are causing a significant impact on the patient's life.   Review of Systems:  A 10 point review of systems is negative, except for the pertinent positives and negatives detailed in the HPI.  Past Medical History: Past  Medical History:  Diagnosis Date  . Dermatophytosis of nail   . GERD (gastroesophageal reflux disease)   . History of shingles   . Hypertension   . Keratoderma     Past Surgical History: Past Surgical History:  Procedure Laterality Date  . BREAST BIOPSY Right 09/30/2017   Affirm bx-calcs ( X clip),benign  . BREAST EXCISIONAL BIOPSY Right    neg  . cataracts    . COLONOSCOPY W/ POLYPECTOMY    . COLONOSCOPY WITH PROPOFOL N/A 02/10/2017   Procedure: COLONOSCOPY WITH PROPOFOL;  Surgeon: Scot Jun, MD;  Location: [facility] ENDOSCOPY;  Service: Endoscopy;  Laterality: N/A;  . ESOPHAGOGASTRODUODENOSCOPY (EGD) WITH PROPOFOL N/A 02/10/2017   Procedure: ESOPHAGOGASTRODUODENOSCOPY (EGD) WITH PROPOFOL;  Surgeon: Scot Jun, MD;  Location: [facility] ENDOSCOPY;  Service: Endoscopy;  Laterality: N/A;  . EYE SURGERY     bilateral cataracts  . HAND SURGERY    . KIDNEY STONE SURGERY    . SACROPLASTY N/A 07/06/2019   Procedure: SACROPLASTY;  Surgeon: Kennedy Bucker, MD;  Location: [facility] ORS;  Service: Orthopedics;  Laterality: N/A;  . TONSILECTOMY, ADENOIDECTOMY, BILATERAL MYRINGOTOMY AND TUBES    . TONSILLECTOMY    . TRIGGER FINGER RELEASE     Left ring finger  . tubercular peritonitis      Allergies: Allergies as of 07/17/2019 - Review Complete 07/17/2019  Allergen Reaction Noted  . Aspirin Other (See Comments) 02/25/2016  . Conray [iothalamate] Hives 08/27/2017  . Dye fdc red [red dye] Hives 02/25/2016  .  Sulfa antibiotics  02/25/2016  . Prednisone  11/01/2018    Medications:  Current Facility-Administered Medications:  .  [START ON 07/18/2019] cholecalciferol (VITAMIN D3) tablet 1,000 Units, 1,000 Units, Oral, Daily, Agbata, Tochukwu, MD .  cyclobenzaprine (FLEXERIL) tablet 5 mg, 5 mg, Oral, TID, Agbata, Tochukwu, MD .  enoxaparin (LOVENOX) injection 30 mg, 30 mg, Subcutaneous, Q24H, Agbata, Tochukwu, MD .  escitalopram (LEXAPRO) tablet 5 mg, 5 mg, Oral, Daily, Agbata, Tochukwu,  MD .  feeding supplement (ENSURE ENLIVE) (ENSURE ENLIVE) liquid 237 mL, 237 mL, Oral, TID, Agbata, Tochukwu, MD .  insulin aspart (novoLOG) injection 0-9 Units, 0-9 Units, Subcutaneous, TID WC, Agbata, Tochukwu, MD .  Melene Muller ON 07/18/2019] levothyroxine (SYNTHROID) tablet 25 mcg, 25 mcg, Oral, Q0600, Agbata, Tochukwu, MD .  lisinopril (ZESTRIL) tablet 40 mg, 40 mg, Oral, Daily, Agbata, Tochukwu, MD .  morphine 2 MG/ML injection 2 mg, 2 mg, Intravenous, Q4H PRN, Agbata, Tochukwu, MD .  ondansetron (ZOFRAN) tablet 4 mg, 4 mg, Oral, Q6H PRN **OR** ondansetron (ZOFRAN) injection 4 mg, 4 mg, Intravenous, Q6H PRN, Agbata, Tochukwu, MD .  pantoprazole (PROTONIX) EC tablet 40 mg, 40 mg, Oral, Daily, Agbata, Tochukwu, MD .  Plecanatide TABS 3 mg, 3 mg, Oral, Daily, Agbata, Tochukwu, MD .  simvastatin (ZOCOR) tablet 40 mg, 40 mg, Oral, QHS, Agbata, Tochukwu, MD  Current Outpatient Medications:  .  Blood Glucose Monitoring Suppl (ONE TOUCH BASIC SYSTEM) w/Device KIT, Use glucometer to check blood sugar daily., Disp: 1 each, Rfl: 0 .  cholecalciferol (VITAMIN D) 1000 units tablet, Take 2,000 Units by mouth daily. , Disp: , Rfl:  .  cyclobenzaprine (FLEXERIL) 5 MG tablet, Take 1 tablet (5 mg total) by mouth 3 (three) times daily., Disp: 30 tablet, Rfl: 0 .  escitalopram (LEXAPRO) 5 MG tablet, TAKE 1 TABLET BY MOUTH ONCE DAILY WITH FOOD (Patient taking differently: Take 5 mg by mouth daily. ), Disp: 90 tablet, Rfl: 3 .  feeding supplement, ENSURE ENLIVE, (ENSURE ENLIVE) LIQD, Take 237 mLs by mouth 3 (three) times daily., Disp: , Rfl:  .  ferrous sulfate 325 (65 FE) MG tablet, Take 325 mg by mouth daily., Disp: , Rfl:  .  glimepiride (AMARYL) 4 MG tablet, Take 2-4 mg by mouth daily. With meal, either breakfast or dinner., Disp: 45 tablet, Rfl: 1 .  glucose blood (ONE TOUCH ULTRA TEST) test strip, CHECK BLOOD SUGAR UP TO 2 TIMES A DAY., Disp: 100 each, Rfl: 11 .  levothyroxine (SYNTHROID) 25 MCG tablet, TAKE 1  TABLET BY MOUTH ONCE DAILY ON AN EMPTY STOMACH. WAIT 30 MINUTES BEFORE TAKING OTHER MEDS. (Patient taking differently: Take 25 mcg by mouth daily before breakfast. TAKE 1 TABLET BY MOUTH ONCE DAILY ON AN EMPTY STOMACH. WAIT 30 MINUTES BEFORE TAKING OTHER MEDS.), Disp: 90 tablet, Rfl: 2 .  lisinopril (ZESTRIL) 40 MG tablet, Take 1 tablet (40 mg total) by mouth daily., Disp: 30 tablet, Rfl: 0 .  Melatonin 10 MG SUBL, Place 10 mg under the tongue at bedtime as needed (sleep)., Disp: , Rfl:  .  omeprazole (PRILOSEC) 20 MG capsule, TAKE 1 CAPSULE BY MOUTH ONCE DAILY (Patient taking differently: Take 20 mg by mouth daily. ), Disp: 90 capsule, Rfl: 3 .  ONETOUCH DELICA LANCETS 33G MISC, 1 each by Does not apply route 3 (three) times daily. Dx:E11.9, Disp: 100 each, Rfl: 12 .  oxyCODONE (OXY IR/ROXICODONE) 5 MG immediate release tablet, Take 1 tablet (5 mg total) by mouth every 4 (four) hours as needed for  severe pain., Disp: 20 tablet, Rfl: 0 .  senna (SENOKOT) 8.6 MG TABS tablet, Take 1 tablet (8.6 mg total) by mouth 2 (two) times daily. (Patient taking differently: Take 1 tablet by mouth 2 (two) times daily as needed for mild constipation or moderate constipation. ), Disp: 120 tablet, Rfl: 0 .  simvastatin (ZOCOR) 40 MG tablet, TAKE 1 TABLET BY MOUTH AT BEDTIME (Patient taking differently: Take 40 mg by mouth at bedtime. ), Disp: 90 tablet, Rfl: 3 .  traMADol (ULTRAM) 50 MG tablet, Take 50 mg by mouth 4 (four) times daily as needed for moderate pain. , Disp: , Rfl:  .  Plecanatide (TRULANCE) 3 MG TABS, Take 3 mg by mouth daily. (Patient not taking: Reported on 07/17/2019), Disp: 30 tablet, Rfl: 0   Social History: Social History   Tobacco Use  . Smoking status: Never Smoker  . Smokeless tobacco: Never Used  Vaping Use  . Vaping Use: Never used  Substance Use Topics  . Alcohol use: Not Currently  . Drug use: No    Family Medical History: Family History  Adopted: Yes  Problem Relation Age of  Onset  . Breast cancer Neg Hx     Physical Examination: Vitals:   07/17/19 1530 07/17/19 1600  BP: (!) 157/69 (!) 156/137  Pulse: 86 88  Resp:    Temp:    SpO2: 96% 99%     General: Patient is well developed, well nourished, calm, collected, and in no apparent distress.  Psychiatric: Patient is non-anxious.  Head:  Pupils equal, round, and reactive to light.  ENT:  Oral mucosa appears well hydrated.  Neck:   No tenderness to palpation over cervical, thoracic spine    NEUROLOGICAL:  General: In no acute distress.   Awake, alert, oriented to person, place, and time.  Pupils equal round and reactive to light.  Facial tone is symmetric.  Tongue protrusion is midline.  There is no pronator drift.  ROM of spine: Deferred as patient laying in bed.  Strength: Side Biceps Triceps Deltoid Interossei Grip Wrist Ext. Wrist Flex.  R '5 5 5 5 5 5 5  '$ L '5 5 5 5 5 5 5   '$ Side Iliopsoas Quads Hamstring PF DF EHL  R '5 5 5 5 5 5  '$ L '5 5 5 5 5 5   '$ Reflexes are 2+ and symmetric at the biceps and brachioradialis, 3+ on L patella, 2+ on R patella.   Bilateral upper and lower extremity sensation is intact to light touch and pin prick.  Clonus is not present. Hoffman's is absent.  Sensation to perineal area is intact to light touch as well as pinprick, with the exception of a small area on the right labia wh where the patient describes decreased sensation to light touch but full sensation to pinprick.  However, on reexamination, sensation is intact to light touch to this area.  Rectal tone is intact.  Rectal sensation is intact.  Post void residual is 717 mL  Imaging:  Lumbar spine MRI: IMPRESSION: 1. Bilateral sacral insufficiency fractures with surrounding marrow edema. 2. Diffuse lumbar spine spondylosis as described above. 3. At L4-5 there is a broad-based disc bulge. Severe bilateral facet arthropathy. Mild spinal stenosis. Bilateral lateral recess stenosis. Moderate bilateral  foraminal stenosis. 4. At L2-3 there is a broad-based disc bulge eccentric towards the left. Mild bilateral facet arthropathy. Moderate left foraminal stenosis. Mild right foraminal stenosis. Left subarticular recess stenosis. Mild spinal stenosis. 5. At L5-S1 there is  a broad-based disc bulge with a small right paracentral disc protrusion. Mild bilateral foraminal stenosis.  Assessment and Plan: Ms. Whatley is a pleasant 86 y.o. female with urinary retention and incontinence.  Reassuringly, lumbar spine imaging does not show any evidence of cauda equina syndrome, and on her exam, she is neurologically intact, although she does endorse intermittent decreased sensation over the right labia. Discussed with Dr. Lacinda Axon, we do not believe her presentation is consistent with cauda equina.  We would recommend getting in touch with Dr. Rudene Christians in regard to surgery. Also recommend a urology consult while inpatient.  No acute neurosurgical intervention required at this time. There is no need for neurosurgical follow-up as outpatient unless the patient desires.   I have discussed the condition with the patient, including showing the radiographs and discussing treatment options in layman's terms.   We thank you for this consult.   Lonell Face, NP Dept. of Neurosurgery

## 2019-07-17 NOTE — ED Notes (Signed)
Report received, care assumed.

## 2019-07-17 NOTE — ED Notes (Signed)
Neurosurgery to bedside. 

## 2019-07-18 ENCOUNTER — Telehealth: Payer: Self-pay

## 2019-07-18 LAB — BASIC METABOLIC PANEL
Anion gap: 10 (ref 5–15)
BUN: 22 mg/dL (ref 8–23)
CO2: 25 mmol/L (ref 22–32)
Calcium: 9 mg/dL (ref 8.9–10.3)
Chloride: 94 mmol/L — ABNORMAL LOW (ref 98–111)
Creatinine, Ser: 1.24 mg/dL — ABNORMAL HIGH (ref 0.44–1.00)
GFR calc Af Amer: 45 mL/min — ABNORMAL LOW (ref >60)
GFR calc non Af Amer: 39 mL/min — ABNORMAL LOW (ref >60)
Glucose, Bld: 117 mg/dL — ABNORMAL HIGH (ref 70–99)
Potassium: 4.5 mmol/L (ref 3.5–5.1)
Sodium: 129 mmol/L — ABNORMAL LOW (ref 135–145)

## 2019-07-18 LAB — CBC
HCT: 28.3 % — ABNORMAL LOW (ref 36.0–46.0)
Hemoglobin: 9.6 g/dL — ABNORMAL LOW (ref 12.0–15.0)
MCH: 31.2 pg (ref 26.0–34.0)
MCHC: 33.9 g/dL (ref 30.0–36.0)
MCV: 91.9 fL (ref 80.0–100.0)
Platelets: 474 10*3/uL — ABNORMAL HIGH (ref 150–400)
RBC: 3.08 MIL/uL — ABNORMAL LOW (ref 3.87–5.11)
RDW: 13.2 % (ref 11.5–15.5)
WBC: 8.2 10*3/uL (ref 4.0–10.5)
nRBC: 0 % (ref 0.0–0.2)

## 2019-07-18 LAB — GLUCOSE, CAPILLARY
Glucose-Capillary: 126 mg/dL — ABNORMAL HIGH (ref 70–99)
Glucose-Capillary: 237 mg/dL — ABNORMAL HIGH (ref 70–99)
Glucose-Capillary: 123 mg/dL — ABNORMAL HIGH (ref 70–99)
Glucose-Capillary: 139 mg/dL — ABNORMAL HIGH (ref 70–99)

## 2019-07-18 LAB — SARS CORONAVIRUS 2 (TAT 6-24 HRS): SARS Coronavirus 2: NEGATIVE

## 2019-07-18 MED ORDER — OXYCODONE HCL 5 MG PO TABS
5.0000 mg | ORAL_TABLET | ORAL | Status: DC | PRN
  Administered 2019-07-19: 5 mg via ORAL
  Filled 2019-07-18: qty 1

## 2019-07-18 MED ORDER — POLYETHYLENE GLYCOL 3350 17 G PO PACK
17.0000 g | PACK | Freq: Every day | ORAL | Status: DC | PRN
  Administered 2019-07-21 – 2019-07-24 (×2): 17 g via ORAL
  Filled 2019-07-18 (×3): qty 1

## 2019-07-18 MED ORDER — SENNOSIDES-DOCUSATE SODIUM 8.6-50 MG PO TABS
1.0000 | ORAL_TABLET | Freq: Two times a day (BID) | ORAL | Status: DC
  Administered 2019-07-18 – 2019-07-24 (×12): 1 via ORAL
  Filled 2019-07-18 (×13): qty 1

## 2019-07-18 MED ORDER — ACETAMINOPHEN 500 MG PO TABS
1000.0000 mg | ORAL_TABLET | Freq: Four times a day (QID) | ORAL | Status: DC | PRN
  Administered 2019-07-18 – 2019-07-23 (×4): 1000 mg via ORAL
  Filled 2019-07-18 (×4): qty 2

## 2019-07-18 MED ORDER — GLIMEPIRIDE 2 MG PO TABS
2.0000 mg | ORAL_TABLET | Freq: Every day | ORAL | Status: DC
  Administered 2019-07-19 – 2019-07-24 (×6): 2 mg via ORAL
  Filled 2019-07-18 (×7): qty 1

## 2019-07-18 MED ORDER — MELATONIN 5 MG PO TABS
10.0000 mg | ORAL_TABLET | Freq: Every evening | ORAL | Status: DC | PRN
  Administered 2019-07-22: 10 mg via ORAL
  Filled 2019-07-18: qty 2

## 2019-07-18 NOTE — Progress Notes (Signed)
Physical Therapy Evaluation Patient Details Name: Lisa Mills MRN: [id number] DOB: 07-Aug-1929 Today's Date: 07/18/2019   History of Present Illness  Pt is 86 yo female that presented to ED due to severe constipation/urinary retention. PMH of hypertension, GERD and diabetes on oral hypoglycemics at home, bilateral sacral alla fracture for which she underwent sacral plasty and discharged home 07/06/2019. Multiple ED visits noted in the last 6 weeks.    Clinical Impression  Pt alert, agreeable to PT. Family and pt and PT spent extensive time discussing pt status, level of function, discharge options/planning, PT role and POC. Pt demonstrated bed mobility (log rolling) with CGA and cueing. Sit <> stand with RW and CGA, and ambulated ~53ft with RW and CGA. Pt exhibited tremors and fatigued easily while ambulating. Overall the patient continues to demonstrate limitations in functional mobility that impairs her independence and safety at home. Pt lives alone, and family has been assisting up until recent hospital visits, but is unable to provide 24/7 assistance of adequate physical assistance. The patient would benefit from further skilled PT to address these limitations to maximize safety, mobility and independence, recommendation is SNF.    Follow Up Recommendations SNF    Equipment Recommendations  None recommended by PT    Recommendations for Other Services       Precautions / Restrictions Precautions Precautions: Fall Restrictions Weight Bearing Restrictions: No      Mobility  Bed Mobility Overal bed mobility: Needs Assistance Bed Mobility: Rolling;Sidelying to Sit;Sit to Supine Rolling: Min guard Sidelying to sit: Min guard   Sit to supine: Min guard   General bed mobility comments: Reporting severe pain with mobility. Moaning. instructed in log rolling technique to minimize pain. returned to supine quickly due to pain  Transfers Overall transfer level: Needs  assistance Equipment used: Rolling walker (2 wheeled) Transfers: Sit to/from Stand Sit to Stand: Min guard;From elevated surface            Ambulation/Gait Ambulation/Gait assistance: Min guard Gait Distance (Feet): 30 Feet Assistive device: Rolling walker (2 wheeled) Gait Pattern/deviations: Step-to pattern;Shuffle;Decreased stride length Gait velocity: decr   General Gait Details: pt tremulous with ambulation, reported pain in bilateral buttocks. fatigued quickly  Stairs            Wheelchair Mobility    Modified Rankin (Stroke Patients Only)       Balance Overall balance assessment: Needs assistance Sitting-balance support: Feet supported Sitting balance-Leahy Scale: Good     Standing balance support: Bilateral upper extremity supported;During functional activity Standing balance-Leahy Scale: Good Standing balance comment: reliant on RW for pain control and safety                             Pertinent Vitals/Pain Pain Assessment: 0-10 Pain Score: 2  Pain Location: sacrum Pain Descriptors / Indicators: Sore;Aching Pain Intervention(s): Limited activity within patient's tolerance;Monitored during session;Premedicated before session;Repositioned    Home Living Family/patient expects to be discharged to:: Private residence Living Arrangements: Alone Available Help at Discharge: Family;Available 24 hours/day (for a short time, then family will need to go back to work) Type of Home: House Home Access: Stairs to enter Entrance Stairs-Rails: Right;Left;Can reach both Technical brewer of Steps: Mason: One level Lee: Grab bars - toilet;Grab bars - tub/shower;Walker - 2 wheels Additional Comments: also has 2 wheelchairs however doesn't use at baseline    Prior Function Level of Independence: Independent  Comments: Pt active and Ind with amb without an AD, walks dog multiple times per day, Ind with ADLs, fall  secondary to tripping over neighbor's dog, no other falls "in years". Since last admission, has only been household ambulator     Journalist, newspaper        Extremity/Trunk Assessment   Upper Extremity Assessment Upper Extremity Assessment: Overall WFL for tasks assessed    Lower Extremity Assessment Lower Extremity Assessment: Generalized weakness (pt reported pain with MMT testing)       Communication   Communication: No difficulties  Cognition Arousal/Alertness: Awake/alert Behavior During Therapy: WFL for tasks assessed/performed;Anxious Overall Cognitive Status: Within Functional Limits for tasks assessed                                        General Comments      Exercises Other Exercises Other Exercises: extensive time spent on education; PT role, POC, expectations of hospital stay, discharge planning options, catheter   Assessment/Plan    PT Assessment Patient needs continued PT services  PT Problem List Decreased strength;Decreased activity tolerance;Decreased balance;Decreased mobility;Decreased knowledge of use of DME;Pain       PT Treatment Interventions DME instruction;Gait training;Stair training;Functional mobility training;Therapeutic activities;Therapeutic exercise;Balance training;Patient/family education    PT Goals (Current goals can be found in the Care Plan section)  Acute Rehab PT Goals Patient Stated Goal: to walk without pain PT Goal Formulation: With patient Time For Goal Achievement: 08/01/19 Potential to Achieve Goals: Good    Frequency Min 2X/week   Barriers to discharge Decreased caregiver support;Inaccessible home environment      Co-evaluation               AM-PAC PT "6 Clicks" Mobility  Outcome Measure Help needed turning from your back to your side while in a flat bed without using bedrails?: A Little Help needed moving from lying on your back to sitting on the side of a flat bed without using bedrails?: A  Lot Help needed moving to and from a bed to a chair (including a wheelchair)?: A Little Help needed standing up from a chair using your arms (e.g., wheelchair or bedside chair)?: A Little Help needed to walk in hospital room?: A Lot Help needed climbing 3-5 steps with a railing? : A Lot 6 Click Score: 15    End of Session Equipment Utilized During Treatment: Gait belt Activity Tolerance: Patient limited by pain Patient left: in bed;with call bell/phone within reach;with bed alarm set;with family/visitor present Nurse Communication: Mobility status PT Visit Diagnosis: Unsteadiness on feet (R26.81);Muscle weakness (generalized) (M62.81);Other abnormalities of gait and mobility (R26.89);Pain Pain - Right/Left:  (bilateral) Pain - part of body:  (low back/hips)    Time: 0867-6195 PT Time Calculation (min) (ACUTE ONLY): 39 min   Charges:   PT Evaluation $PT Eval Moderate Complexity: 1 Mod PT Treatments $Therapeutic Exercise: 23-37 mins       Lieutenant Diego PT, DPT 4:27 PM,07/18/19

## 2019-07-18 NOTE — Telephone Encounter (Signed)
Patient called, left VM to return the call to the office. 

## 2019-07-18 NOTE — Consult Note (Addendum)
Urology Consult  I have been asked to see the patient by Dr. Sloan Leiter, for evaluation and management of urinary retention.  Chief Complaint: Pelvic pain  History of Present Illness: Lisa Mills is a 86 y.o. year old diabetic female with multiple recent hospitalizations due to pelvic fractures with associated constipation and recurrent urinary retention requiring Foley catheterization.  Patient was initially admitted on 6/15-18/2021 for management of worsening pelvic pain 2/2 a transverse S1 and nondisplaced bilateral sacral alar fractures caused by a mechanical fall one month prior. She underwent sacroplasty with Dr. Rudene Christians on  07/06/2019. At the time of this admission, she reported urinary incontinence and was found to be in urinary retention with bladder scan 66m. Foley was placed at the time but and removed prior to discharge; unable to determine timing of this per chart review.  Patient was readmitted from 6/20-24/2021 for management of severe constipation x7 days believed 2/2 opioid use. She reported urinary frequency during that hospitalization; bladder scan with 1665m She was scheduled for outpatient urology follow-up with Dr. MaMatilde Sprangn 08/14/2019.  She presented to the ED yesterday with reports of worsening pelvic pain, urinary incontinence, fecal incontinence, and pelvic numbness. MR pelvis and lumbar spine were obtained to evaluate her for cauda equina syndrome with findings of spinal stenosis without acute compression as well as a distended urinary bladder.  Foley catheter placed at that time; no bladder scan data available but patient reports filling her night bag approximately halfway within one hour of placement.  Admission UA yesterday with no nitrites, pyuria, microscopic hematuria, or bacteriuria.  WBC count stable at 8.8.  Creatinine 1.21, baseline around 1.1.  Today, patient reports some vulvovaginal irritation 2/2 catheterization yesterday. She denies dysuria prior  and is tolerating Foley well. She does report continued constipation and continues to require narcotic pain medication. Foley in place draining clear, yellow urine.  She reports an approximate 6 month history of dribbling urge incontinence, for which she wears pads at baseline. She states she saw a urologist approximately 20 years ago for urinary monitoring in the setting of her diabetes. She reports her urinary symptoms acutely worsened around the time of her sacroplasty, with significant new urinary leakage.  Past Medical History:  Diagnosis Date  . Dermatophytosis of nail   . GERD (gastroesophageal reflux disease)   . History of shingles   . Hypertension   . Keratoderma     Past Surgical History:  Procedure Laterality Date  . BREAST BIOPSY Right 09/30/2017   Affirm bx-calcs ( X clip),benign  . BREAST EXCISIONAL BIOPSY Right    neg  . cataracts    . COLONOSCOPY W/ POLYPECTOMY    . COLONOSCOPY WITH PROPOFOL N/A 02/10/2017   Procedure: COLONOSCOPY WITH PROPOFOL;  Surgeon: ElManya SilvasMD;  [facility];  Service: Endoscopy;  Laterality: N/A;  . ESOPHAGOGASTRODUODENOSCOPY (EGD) WITH PROPOFOL N/A 02/10/2017   Procedure: ESOPHAGOGASTRODUODENOSCOPY (EGD) WITH PROPOFOL;  Surgeon: ElManya SilvasMD;  [facility];  Service: Endoscopy;  Laterality: N/A;  . EYE SURGERY     bilateral cataracts  . HAND SURGERY    . KIDNEY STONE SURGERY    . SACROPLASTY N/A 07/06/2019   Procedure: SACROPLASTY;  Surgeon: MeHessie KnowsMD;  Location: [facility] ORS;  Service: Orthopedics;  Laterality: N/A;  . TONSILECTOMY, ADENOIDECTOMY, BILATERAL MYRINGOTOMY AND TUBES    . TONSILLECTOMY    . TRIGGER FINGER RELEASE     Left ring finger  . tubercular peritonitis  Home Medications:  Current Meds  Medication Sig  . Blood Glucose Monitoring Suppl ([place]) w/Device KIT Use glucometer to check blood sugar daily.  . cholecalciferol (VITAMIN D) 1000 units tablet Take  2,000 Units by mouth daily.   . cyclobenzaprine (FLEXERIL) 5 MG tablet Take 1 tablet (5 mg total) by mouth 3 (three) times daily.  Marland Kitchen escitalopram (LEXAPRO) 5 MG tablet TAKE 1 TABLET BY MOUTH ONCE DAILY WITH FOOD (Patient taking differently: Take 5 mg by mouth daily. )  . feeding supplement, ENSURE ENLIVE, (ENSURE ENLIVE) LIQD Take 237 mLs by mouth 3 (three) times daily.  . ferrous sulfate 325 (65 FE) MG tablet Take 325 mg by mouth daily.  Marland Kitchen glimepiride (AMARYL) 4 MG tablet Take 2-4 mg by mouth daily. With meal, either breakfast or dinner.  Marland Kitchen glucose blood (ONE TOUCH ULTRA TEST) test strip CHECK BLOOD SUGAR UP TO 2 TIMES A DAY.  Marland Kitchen levothyroxine (SYNTHROID) 25 MCG tablet TAKE 1 TABLET BY MOUTH ONCE DAILY ON AN EMPTY STOMACH. WAIT 30 MINUTES BEFORE TAKING OTHER MEDS. (Patient taking differently: Take 25 mcg by mouth daily before breakfast. TAKE 1 TABLET BY MOUTH ONCE DAILY ON AN EMPTY STOMACH. WAIT 30 MINUTES BEFORE TAKING OTHER MEDS.)  . lisinopril (ZESTRIL) 40 MG tablet Take 1 tablet (40 mg total) by mouth daily.  . Melatonin 10 MG SUBL Place 10 mg under the tongue at bedtime as needed (sleep).  Marland Kitchen omeprazole (PRILOSEC) 20 MG capsule TAKE 1 CAPSULE BY MOUTH ONCE DAILY (Patient taking differently: Take 20 mg by mouth daily. )  . ONETOUCH DELICA LANCETS 16W MISC 1 each by Does not apply route 3 (three) times daily. Dx:E11.9  . oxyCODONE (OXY IR/ROXICODONE) 5 MG immediate release tablet Take 1 tablet (5 mg total) by mouth every 4 (four) hours as needed for severe pain.  Marland Kitchen senna (SENOKOT) 8.6 MG TABS tablet Take 1 tablet (8.6 mg total) by mouth 2 (two) times daily. (Patient taking differently: Take 1 tablet by mouth 2 (two) times daily as needed for mild constipation or moderate constipation. )  . simvastatin (ZOCOR) 40 MG tablet TAKE 1 TABLET BY MOUTH AT BEDTIME (Patient taking differently: Take 40 mg by mouth at bedtime. )  . traMADol (ULTRAM) 50 MG tablet Take 50 mg by mouth 4 (four) times daily as  needed for moderate pain.     Allergies:  Allergies  Allergen Reactions  . Aspirin Other (See Comments)    Burns stomach  . Conray [Iothalamate] Hives    IV dye conray-400  . Dye Fdc Red [Red Dye] Hives  . Sulfa Antibiotics     Unknown Reaction, not used in years  . Prednisone     Indigestion     Family History  Adopted: Yes  Problem Relation Age of Onset  . Breast cancer Neg Hx     Social History:  reports that she has never smoked. She has never used smokeless tobacco. She reports previous alcohol use. She reports that she does not use drugs.  ROS: A complete review of systems was performed.  All systems are negative except for pertinent findings as noted.  Physical Exam:  Vital signs in last 24 hours: Temp:  [97.7 F (36.5 C)-98.6 F (37 C)] 97.7 F (36.5 C) (06/29 1145) Pulse Rate:  [77-89] 77 (06/29 1145) Resp:  [16-18] 18 (06/29 1145) BP: (131-169)/(60-137) 131/60 (06/29 1145) SpO2:  [94 %-99 %] 97 % (06/29 1145) Constitutional:  Alert and oriented, no acute distress HEENT:  [place] AT, moist mucus membranes Cardiovascular: No clubbing, cyanosis, or edema. Respiratory: Normal respiratory effort Skin: No rashes, bruises or suspicious lesions Neurologic: Grossly intact, moving all 4 extremities Psychiatric: Normal mood and affect  Laboratory Data:  Recent Labs    07/17/19 0643 07/18/19 0435  WBC 8.8  8.7 8.2  HGB 10.5*  10.4* 9.6*  HCT 31.0*  30.0* 28.3*   Recent Labs    07/17/19 0643 07/18/19 0435  NA 127* 129*  K 4.4 4.5  CL 90* 94*  CO2 24 25  GLUCOSE 130* 117*  BUN 19 22  CREATININE 1.21* 1.24*  CALCIUM 9.5 9.0   Urinalysis    Component Value Date/Time   COLORURINE STRAW (A) 07/17/2019 1143   APPEARANCEUR CLEAR (A) 07/17/2019 1143   APPEARANCEUR clear 03/29/2017 0000   LABSPEC 1.005 07/17/2019 [place] 8.0 07/17/2019 [place] 07/17/2019 1143   HGBUR NEGATIVE 07/17/2019 1143   BILIRUBINUR NEGATIVE 07/17/2019 1143     BILIRUBINUR Negative (A) 03/29/2017 0000   KETONESUR 5 (A) 07/17/2019 1143   PROTEINUR NEGATIVE 07/17/2019 1143   UROBILINOGEN Normal 03/29/2017 0000   NITRITE NEGATIVE 07/17/2019 1143   LEUKOCYTESUR NEGATIVE 07/17/2019 1143   Results for orders placed or performed during the hospital encounter of 07/17/19  SARS CORONAVIRUS 2 (TAT 6-24 HRS) Nasopharyngeal Nasopharyngeal Swab     Status: None   Collection Time: 07/17/19  6:42 PM   Specimen: Nasopharyngeal Swab  Result Value Ref Range Status   SARS Coronavirus 2 NEGATIVE NEGATIVE Final    Comment: (NOTE) SARS-CoV-2 target nucleic acids are NOT DETECTED.  The SARS-CoV-2 RNA is generally detectable in upper and lower respiratory specimens during the acute phase of infection. Negative results do not preclude SARS-CoV-2 infection, do not rule out co-infections with other pathogens, and should not be used as the sole basis for treatment or other patient management decisions. Negative results must be combined with clinical observations, patient history, and epidemiological information. The expected result is Negative.  Fact Sheet for Patients: SugarRoll.be  Fact Sheet for Healthcare Providers: https://www.woods-mathews.com/  This test is not yet approved or cleared by the [place] FDA and  has been authorized for detection and/or diagnosis of SARS-CoV-2 by FDA under an Emergency Use Authorization (EUA). This EUA will remain  in effect (meaning this test can be used) for the duration of the COVID-19 declaration under Se ction 564(b)(1) of the Act, 21 U.S.C. section 360bbb-3(b)(1), unless the authorization is terminated or revoked sooner.  Performed at [facility], [place] [address], [place], [place] [address]    Assessment & Plan:  86 year old female with a recent history of multiple pelvic fractures s/p sacroplasty with associated opioid induced constipation and recurrent urinary  retention requiring Foley catheter replacement 1 day ago.  Admission UA reassuring for infection.  Cannot rule out some degree of baseline bladder dysfunction with her history of diabetes and 56-monthhistory of urge incontinence.  That said, etiology of her recent recurrent retention is most likely due to her recent pelvic fractures, anesthesia, and constipation.  Urinary leakage likely represents overflow incontinence. Will defer Flomax due to concerns for increased fall risk.  Recommendations: -Continue bowel regimen -Continue Foley with plans for outpatient voiding trial in approximately 2 weeks  Thank you for involving me in this patient's care, please page with any further questions or concerns.  SDebroah Loop PA-C 07/18/2019 12:04 PM

## 2019-07-18 NOTE — Telephone Encounter (Signed)
Copied from [place] [id number]. Topic: Appointment Scheduling - Scheduling Inquiry for Clinic >> Jul 14, 2019  1:33 PM Oneta Rack wrote: Reason for CHY:IFOYDXA states she hurts when she walks and has the jitters and would like to know if [facility] follow up appointment can be a virtual visit. Also patient states she would have to find transportation.

## 2019-07-18 NOTE — Telephone Encounter (Signed)
Left detail message for the patient that is okay to have virtual visit for hospital follow up if patient calls back please replay the message, thank you.

## 2019-07-18 NOTE — Evaluation (Signed)
Occupational Therapy Evaluation Patient Details Name: Lisa Mills MRN: [id number] DOB: 1929/04/18 Today's Date: 07/18/2019    History of Present Illness Pt is 86 yo female that presented to ED due to severe constipation/urinary retention. PMH of hypertension, GERD and diabetes on oral hypoglycemics at home, bilateral sacral alla fracture for which she underwent sacral plasty and discharged home 07/06/2019. Multiple ED visits noted in the last 6 weeks.   Clinical Impression   Pt presents lying supine this afternoon with daughter present, agreeable to evaluation but limited by sacral, right hip and foley catheter pain with movement. She lived at home alone prior to mechanical fall and resulting sacroplasty (6/17), ambulated without AD and was independent in ADL. Since surgery, this is pt's second readmission. Her daughter and son have been alternating 24/7 care at home, assisting pt with ADL/IADL and per daughter report, they are feeling some stress over the burden of caregiving. Pt has been having significant issues with bladder incontinence, involuntarily voiding with sitting/standing while at home. This, in addition to pt pain levels, have made pt anxious about mobility and movement. Pt's daughter has been providing Max A for pericare and clothing changes. During session, pt was limited in ADL participation and functional mobility due to pain, however demonstrated great strength and movement for bed mobility with Mod I. She demonstrates difficulty with LB access for ADL completion and currently only wears gowns for dressing. Educated pt and daughter re: role of OT in acute care, plan of care, discharge planning options, falls prevention and alternative pain management strategies (i.e. pursed lip breathing). Pt is very motivated and determined to return to PLOF. She will benefit from skilled acute OT to address pain management strategies, safe AE/DME use for ADL and falls prevention strategies to  maximize pt independence. Recommend SNF upon discharge.      Follow Up Recommendations  SNF    Equipment Recommendations  Other (comment) (TBD at next venue)    Recommendations for Other Services       Precautions / Restrictions Precautions Precautions: Fall Restrictions Weight Bearing Restrictions: No      Mobility Bed Mobility Overal bed mobility: Needs Assistance Bed Mobility: Supine to Sit;Sit to Supine Rolling: Min guard Sidelying to sit: Min guard Supine to sit: Supervision Sit to supine: Supervision   General bed mobility comments: Pt reported severe pain with supine to sit. Pain at site of catheter and in sacrum. Pt returned to supine to minimize pain.  Transfers Overall transfer level: Needs assistance Equipment used: Rolling walker (2 wheeled) Transfers: Sit to/from Stand Sit to Stand: Min guard;From elevated surface         General transfer comment: One unsuccessful attempt, deferred 2/2 pain    Balance Overall balance assessment: Needs assistance Sitting-balance support: Feet supported;Bilateral upper extremity supported Sitting balance-Leahy Scale: Good Sitting balance - Comments: good static sitting at EOB, used BUE to relieve some sacral pressure in sitting   Standing balance support: Bilateral upper extremity supported;During functional activity Standing balance-Leahy Scale: Good Standing balance comment: reliant on RW for pain control and safety                           ADL either performed or assessed with clinical judgement   ADL Overall ADL's : Needs assistance/impaired  General ADL Comments: Pt requires Max A for LB access at bed level. Pain limited ADL performance and functional mobility this session.     Vision         Perception     Praxis      Pertinent Vitals/Pain Pain Assessment: 0-10 Pain Score: 2  (at rest) Faces Pain Scale: Hurts even more (during  movement) Pain Location: sacrum Pain Descriptors / Indicators: Sore;Aching;Grimacing;Discomfort Pain Intervention(s): Limited activity within patient's tolerance;Monitored during session;Repositioned     Hand Dominance     Extremity/Trunk Assessment Upper Extremity Assessment Upper Extremity Assessment: Overall WFL for tasks assessed   Lower Extremity Assessment Lower Extremity Assessment: Defer to PT evaluation (Difficult to assess 2/2 pain)       Communication Communication Communication: No difficulties   Cognition Arousal/Alertness: Awake/alert Behavior During Therapy: WFL for tasks assessed/performed;Anxious Overall Cognitive Status: Within Functional Limits for tasks assessed                                 General Comments: pt A&O, anxiety around movement and pain   General Comments       Exercises Other Exercises Other Exercises: extensive time spent on education; PT role, POC, expectations of hospital stay, discharge planning options, catheter Other Exercises: Educated pt and daughter re: role of OT in acute care, plan of care, discharge planning options, falls prevention and alternative pain management strategies (i.e. pursed lip breathing)   Shoulder Instructions      Home Living Family/patient expects to be discharged to:: Private residence Living Arrangements: Alone Available Help at Discharge: Family;Available 24 hours/day (son and daughter alternate) Type of Home: House Home Access: Stairs to enter [employer] of Steps: 4 Entrance Stairs-Rails: Right;Left;Can reach both Home Layout: One level     Bathroom Shower/Tub: Tub/shower unit         Home Equipment: Grab bars - toilet;Grab bars - tub/shower;Walker - 2 wheels   Additional Comments: has 2 wheelchairs however doesn't use at baseline; pt's son and daughter are feeling stretched thin      Prior Functioning/Environment Level of Independence: Independent         Comments: Pt active and Ind with amb without an AD, walks dog multiple times per day, Ind with ADLs, fall secondary to tripping over neighbor's dog, no other falls "in years". Since last admission, has only been household ambulator with RW and has struggled with urinary incontinence, using pads/Depends/etc. Difficulties sitting on toilet due to sacral pain.        OT Problem List: Decreased strength;Decreased range of motion;Decreased activity tolerance;Impaired balance (sitting and/or standing);Decreased knowledge of use of DME or AE;Pain      OT Treatment/Interventions: Self-care/ADL training;Therapeutic exercise;Energy conservation;DME and/or AE instruction;Therapeutic activities;Patient/family education;Balance training    OT Goals(Current goals can be found in the care plan section) Acute Rehab OT Goals Patient Stated Goal: To move without pain OT Goal Formulation: With patient/family Time For Goal Achievement: 08/01/19 Potential to Achieve Goals: Good ADL Goals Pt Will Perform Lower Body Dressing: with adaptive equipment;bed level;with supervision (using AE PRN) Pt Will Transfer to Toilet: ambulating;bedside commode;regular height toilet;with supervision (BSC over toilet with LRAD as needed) Additional ADL Goal #1: Pt will incorporate >3 pain management strategies during ADL performance with 1 or fewer verbal cues to reduce pt anxiety and maximize pt independence  OT Frequency: Min 1X/week   Barriers to D/C:  Co-evaluation              AM-PAC OT "6 Clicks" Daily Activity     Outcome Measure Help from another person eating meals?: None Help from another person taking care of personal grooming?: A Little Help from another person toileting, which includes using toliet, bedpan, or urinal?: A Little Help from another person bathing (including washing, rinsing, drying)?: A Lot Help from another person to put on and taking off regular upper body clothing?: A  Little Help from another person to put on and taking off regular lower body clothing?: A Lot 6 Click Score: 17   End of Session    Activity Tolerance: Patient limited by pain Patient left: in bed;with call bell/phone within reach;with bed alarm set;with nursing/sitter in room;with family/visitor present  OT Visit Diagnosis: Unsteadiness on feet (R26.81);Other abnormalities of gait and mobility (R26.89);History of falling (Z91.81);Pain Pain - Right/Left: Right (Sacrum) Pain - part of body: Hip                Time: 1535-1610 OT Time Calculation (min): 35 min Charges:  OT General Charges $OT Visit: 1 Visit OT Evaluation $OT Eval Moderate Complexity: 1 Mod OT Treatments $Self Care/Home Management : 8-22 mins   Jerilynn Birkenhead, OTS 07/18/19, 5:08 PM

## 2019-07-18 NOTE — Consult Note (Signed)
Patient is seen for follow-up of sacral plasty with persisting pelvic pain, new incontinence and persisting pain.  She is also having some posterior right thigh pain. When I saw her this morning when she is laying flat in bed she is resting comfortably and on her scans her bladder was quite distended so I suspect may be a problem with overflow incontinence.  Review of scans there does not appear to be any extravasation of the cement into a sacral nerve root so I do not think there is a sacral nerve root injury.  Additionally no evidence of cauda equina although there is spinal stenosis. On exam she is able to flex tender toes and previously noted perianal sensation by neurosurgery was reviewed. My impression is that she has overflow incontinence with persisting pain secondary sacral fracture some problems with spinal stenosis as well and I do not know if an epidural steroid injection could be done while she is here. Recommend urology consult and see if they agree that this is the cause of her incontinence.

## 2019-07-18 NOTE — Progress Notes (Signed)
PROGRESS NOTE    ZALIA HAUTALA  FXT:[id number] DOB: December 20, 1929 DOA: 07/17/2019 PCP: Olin Hauser, DO    Brief Narrative:  86 year old female with history of hypertension, GERD and diabetes on oral hypoglycemics at home, recent pelvic fracture and multiple hospitalizations, bilateral sacral alla fracture for which she underwent sacral plasty and discharged home.  Presented back 6/21-6/24 for severe constipation.  Discharge home again with home health PT OT.  Recurrent urinary retention, overflow incontinence, mobility troubles. Presented back to the emergency room with complaints of lower extremity weakness, urinary and fecal incontinence, worsening sacral area pain in the context of recent fracture, surgery.  In the emergency room, initially evaluated for cauda equina syndrome however patient with retention of urine and a Foley catheter was placed. MRI of the pelvis and lumbar spine showed multilevel degenerative changes in the spinal cord disease, no acute changes, no expanding cord compression. Patient lives at home with family support, recently having difficulty with pain management, mobility and bowel movements.   Assessment & Plan:   Principal Problem:   Sacral pain Active Problems:   Type 2 diabetes mellitus with stage 4 chronic kidney disease (HCC)   Hypothyroidism   GERD (gastroesophageal reflux disease)   Anemia in chronic kidney disease (CKD)  Sacral pain: Acute on chronic sacral pain consistent with recent history of fracture, trauma and surgery.  Inadequate pain control. Seen by neurosurgery and orthopedics, no acute surgical intervention needed. Patient will need good pain regimen.  Will start patient on around-the-clock Tylenol and oxycodone with scheduled bowel regimen.  Work with PT OT.  Will refer to skilled nursing rehab for inpatient therapies.  Urinary obstruction: Suspect overflow incontinence after urinary retention secondary to decreased mobility  from sacral pain and pelvic pain.  Recent urine culture with E. coli 10,000 colonies.  Urinalysis is clear. Patient will remobilize, good pain relief. Foley catheter was placed on arrival to the emergency room, voiding trial once she has improved mobility. Will discuss with urology.  Type 2 diabetes with stage IV chronic kidney disease, controlled with hyperglycemia: Fairly stable.  On oral hypoglycemics at home.  Continue and also on sliding scale insulin.  Hypothyroidism: On Synthroid.  Constipation: High risk of constipation and recent hospitalization.  Scheduled stool softener and laxative.  GERD: On PPI continue.   DVT prophylaxis: enoxaparin (LOVENOX) injection 30 mg Start: 07/17/19 2200   Code Status: Full code Family Communication: Patient's daughter at bedside Disposition Plan: Status is: Inpatient  Remains inpatient appropriate because:Unsafe d/c plan and Inpatient level of care appropriate due to severity of illness   Dispo: The patient is from: Home              Anticipated d/c is to: SNF              Anticipated d/c date is: 1 day              Patient currently is medically stable to d/c.  Only to the skilled level of care.         Consultants:   Orthopedics  Neurosurgery Urology Procedures:   None  Antimicrobials:   None   Subjective: Patient seen and examined.  Daughter at the bedside.  Multiple questions answered. Patient and family focused on difficulty taking care with pain. Patient and family are worried that when she is laying in the bed, she will have a lot of urine on her bladder and immediately she changes her position due to overflow.  There is  really no incontinence of stool, she had a large loose stool after laxative few days ago.  No bowel movement since then. Pain in the sacral area and some in the inner thighs.  Objective: Vitals:   07/17/19 2012 07/18/19 0049 07/18/19 0428 07/18/19 0831  BP: 133/69 139/72 140/72 (!) 151/69    Pulse: 86 81 84   Resp: 16 16 16    Temp: 98.6 F (37 C) 98.3 F (36.8 C) 98.2 F (36.8 C)   TempSrc: Oral Oral Oral   SpO2: 96% 96% 97%   Weight:      Height:        Intake/Output Summary (Last 24 hours) at 07/18/2019 1134 Last data filed at 07/18/2019 0500 Gross per 24 hour  Intake --  Output 1150 ml  Net -1150 ml   Filed Weights   07/17/19 0641  Weight: 59 kg    Examination:  General exam: Appears calm and comfortable, anxious.  Not in any distress. Respiratory system: Clear to auscultation. Respiratory effort normal. Cardiovascular system: S1 & S2 heard,  Gastrointestinal system: Abdomen is nondistended, soft and nontender.  Foley catheter with clear urine.   Central nervous system: Alert and oriented. No focal neurological deficits. Extremities: Symmetric 5 x 5 power. No extremity weakness.  Not definitely tender area.  Moderate pain on mobility in the bed.    Data Reviewed: I have personally reviewed following labs and imaging studies  CBC: Recent Labs  Lab 07/12/19 0618 07/13/19 0541 07/17/19 0643 07/18/19 0435  WBC 9.6 8.8 8.8  8.7 8.2  NEUTROABS  --   --  6.1  --   HGB 10.2* 10.3* 10.5*  10.4* 9.6*  HCT 30.2* 29.6* 31.0*  30.0* 28.3*  MCV 91.0 88.9 91.7  89.0 91.9  PLT 388 403* 535*  401* 532*   Basic Metabolic Panel: Recent Labs  Lab 07/12/19 0618 07/13/19 0541 07/17/19 0643 07/18/19 0435  NA 131* 132* 127* 129*  K 3.7 4.7 4.4 4.5  CL 96* 95* 90* 94*  CO2 25 28 24 25   GLUCOSE 116* 129* 130* 117*  BUN 12 14 19 22   CREATININE 1.12* 1.09* 1.21* 1.24*  CALCIUM 9.0 9.1 9.5 9.0   GFR: Estimated Creatinine Clearance: 25.3 mL/min (A) (by C-G formula based on SCr of 1.24 mg/dL (H)). Liver Function Tests: Recent Labs  Lab 07/17/19 0643  AST 22  ALT 15  ALKPHOS 106  BILITOT 0.7  PROT 6.8  ALBUMIN 3.9   No results for input(s): LIPASE, AMYLASE in the last 168 hours. No results for input(s): AMMONIA in the last 168  hours. Coagulation Profile: No results for input(s): INR, PROTIME in the last 168 hours. Cardiac Enzymes: No results for input(s): CKTOTAL, CKMB, CKMBINDEX, TROPONINI in the last 168 hours. BNP (last 3 results) No results for input(s): PROBNP in the last 8760 hours. HbA1C: No results for input(s): HGBA1C in the last 72 hours. CBG: Recent Labs  Lab 07/17/19 1155 07/17/19 1807 07/17/19 2113 07/18/19 0803 07/18/19 1119  GLUCAP 128* 105* 148* 126* 237*   Lipid Profile: No results for input(s): CHOL, HDL, LDLCALC, TRIG, CHOLHDL, LDLDIRECT in the last 72 hours. Thyroid Function Tests: No results for input(s): TSH, T4TOTAL, FREET4, T3FREE, THYROIDAB in the last 72 hours. Anemia Panel: No results for input(s): VITAMINB12, FOLATE, FERRITIN, TIBC, IRON, RETICCTPCT in the last 72 hours. Sepsis Labs: No results for input(s): PROCALCITON, LATICACIDVEN in the last 168 hours.  Recent Results (from the past 240 hour(s))  SARS Coronavirus 2 by RT  PCR (hospital order, performed in [facility] hospital lab) Nasopharyngeal Nasopharyngeal Swab     Status: None   Collection Time: 07/09/19 10:46 PM   Specimen: Nasopharyngeal Swab  Result Value Ref Range Status   SARS Coronavirus 2 NEGATIVE NEGATIVE Final    Comment: (NOTE) SARS-CoV-2 target nucleic acids are NOT DETECTED.  The SARS-CoV-2 RNA is generally detectable in upper and lower respiratory specimens during the acute phase of infection. The lowest concentration of SARS-CoV-2 viral copies this assay can detect is 250 copies / mL. A negative result does not preclude SARS-CoV-2 infection and should not be used as the sole basis for treatment or other patient management decisions.  A negative result may occur with improper specimen collection / handling, submission of specimen other than nasopharyngeal swab, presence of viral mutation(s) within the areas targeted by this assay, and inadequate number of viral copies (<250 copies / mL). A  negative result must be combined with clinical observations, patient history, and epidemiological information.  Fact Sheet for Patients:   StrictlyIdeas.no  Fact Sheet for Healthcare Providers: BankingDealers.co.za  This test is not yet approved or  cleared by the [place] FDA and has been authorized for detection and/or diagnosis of SARS-CoV-2 by FDA under an Emergency Use Authorization (EUA).  This EUA will remain in effect (meaning this test can be used) for the duration of the COVID-19 declaration under Section 564(b)(1) of the Act, 21 U.S.C. section 360bbb-3(b)(1), unless the authorization is terminated or revoked sooner.  Performed at [facility], [address] [place], [place] [address]   Urine Culture     Status: Abnormal   Collection Time: 07/11/19  1:21 PM   Specimen: Urine, Random  Result Value Ref Range Status   Specimen Description   Final    URINE, RANDOM Performed at [facility], [address] [place], [place] [address]    Special Requests   Final    NONE Performed at [facility], [place], [place] [address]    Culture 10,000 COLONIES/mL ESCHERICHIA COLI (A)  Final   Report Status 07/13/2019 FINAL  Final   Organism ID, Bacteria ESCHERICHIA COLI (A)  Final      Susceptibility   Escherichia coli - MIC*    AMPICILLIN 4 SENSITIVE Sensitive     CEFAZOLIN <=4 SENSITIVE Sensitive     CEFTRIAXONE <=0.25 SENSITIVE Sensitive     CIPROFLOXACIN <=0.25 SENSITIVE Sensitive     GENTAMICIN <=1 SENSITIVE Sensitive     IMIPENEM <=0.25 SENSITIVE Sensitive     NITROFURANTOIN <=16 SENSITIVE Sensitive     TRIMETH/SULFA <=20 SENSITIVE Sensitive     AMPICILLIN/SULBACTAM <=2 SENSITIVE Sensitive     PIP/TAZO <=4 SENSITIVE Sensitive     * 10,000 COLONIES/mL ESCHERICHIA COLI  SARS CORONAVIRUS 2 (TAT 6-24 HRS) Nasopharyngeal Nasopharyngeal Swab     Status: None   Collection Time:  07/17/19  6:42 PM   Specimen: Nasopharyngeal Swab  Result Value Ref Range Status   SARS Coronavirus 2 NEGATIVE NEGATIVE Final    Comment: (NOTE) SARS-CoV-2 target nucleic acids are NOT DETECTED.  The SARS-CoV-2 RNA is generally detectable in upper and lower respiratory specimens during the acute phase of infection. Negative results do not preclude SARS-CoV-2 infection, do not rule out co-infections with other pathogens, and should not be used as the sole basis for treatment or other patient management decisions. Negative results must be combined with clinical observations, patient history, and epidemiological information. The expected result is Negative.  Fact Sheet for Patients: SugarRoll.be  Fact Sheet for Healthcare Providers: https://www.woods-mathews.com/  This test is not yet approved or cleared by the [place] FDA and  has been authorized for detection and/or diagnosis of SARS-CoV-2 by FDA under an Emergency Use Authorization (EUA). This EUA will remain  in effect (meaning this test can be used) for the duration of the COVID-19 declaration under Se ction 564(b)(1) of the Act, 21 U.S.C. section 360bbb-3(b)(1), unless the authorization is terminated or revoked sooner.  Performed at [facility], [place] [address] [place],  [address]          Radiology Studies: MR LUMBAR SPINE WO CONTRAST  Result Date: 07/17/2019 CLINICAL DATA:  Low back pain, cauda equina syndrome. Numbness in the perineum. EXAM: MRI LUMBAR SPINE WITHOUT CONTRAST TECHNIQUE: Multiplanar, multisequence MR imaging of the lumbar spine was performed. No intravenous contrast was administered. COMPARISON:  CT lumbar spine 07/04/2019 FINDINGS: Segmentation:  Standard. Alignment: 3 mm retrolisthesis of L2 on L3 and L3 on L4. Minimal grade 1 anterolisthesis of L4 on L5. Vertebrae:  No fracture, evidence of discitis, or bone lesion. Other: Marrow edema in the  sacral ala bilaterally with linear components consistent with bilateral sacral insufficiency fractures. Conus medullaris and cauda equina: Conus extends to the T12 L level. Conus and cauda equina appear normal. Paraspinal and other soft tissues: No acute paraspinal abnormality. Right renal cyst. Disc levels: Disc spaces: Degenerative disease with disc height loss at L2-3, L3-4 and L5-S1. T12-L1: No significant disc bulge. No evidence of neural foraminal stenosis. No central canal stenosis. L1-L2: No significant disc bulge. Mild left facet arthropathy. Mild left foraminal stenosis. No right foraminal stenosis. No central canal stenosis. L2-L3: Broad-based disc bulge eccentric towards the left. Mild bilateral facet arthropathy. Moderate left foraminal stenosis. Mild right foraminal stenosis. Left subarticular recess stenosis. Mild spinal stenosis. L3-L4: Mild broad-based disc bulge. Mild bilateral facet arthropathy. Moderate bilateral foraminal stenosis. No central canal stenosis. L4-L5: Broad-based disc bulge. Severe bilateral facet arthropathy. Mild spinal stenosis. Bilateral lateral recess stenosis. Moderate bilateral foraminal stenosis. L5-S1: Broad-based disc bulge with a small right paracentral disc protrusion. Mild bilateral foraminal stenosis. No central canal stenosis. IMPRESSION: 1. Bilateral sacral insufficiency fractures with surrounding marrow edema. 2. Diffuse lumbar spine spondylosis as described above. 3. At L4-5 there is a broad-based disc bulge. Severe bilateral facet arthropathy. Mild spinal stenosis. Bilateral lateral recess stenosis. Moderate bilateral foraminal stenosis. 4. At L2-3 there is a broad-based disc bulge eccentric towards the left. Mild bilateral facet arthropathy. Moderate left foraminal stenosis. Mild right foraminal stenosis. Left subarticular recess stenosis. Mild spinal stenosis. 5. At L5-S1 there is a broad-based disc bulge with a small right paracentral disc protrusion. Mild  bilateral foraminal stenosis. Electronically Signed   By: Kathreen Devoid   On: 07/17/2019 13:45   MR PELVIS WO CONTRAST  Result Date: 07/17/2019 CLINICAL DATA:  Worsening pelvic pain. History of a fall in May, 2021 resulting in sacral fractures and left pubic rami fractures. The patient underwent sacroplasty 07/06/2019. EXAM: MRI PELVIS WITHOUT CONTRAST TECHNIQUE: Multiplanar, multisequence MR imaging of the pelvis was performed. No intravenous contrast was administered. COMPARISON:  CT of the pelvis 07/04/2019. FINDINGS: Bones/Joint/Cartilage The patient has undergone bilateral sacroplasty since the prior examination. There is residual marrow edema in the sacrum due to the patient's fractures. Also again seen are left superior and inferior pubic ramus fractures with surrounding marrow edema. No acute fracture is identified. Lower lumbar degenerative change is noted. There is mild  degenerative disease about the hips, more notable on the right. No joint effusion. No worrisome marrow lesion. Ligaments Intact. Muscles and Tendons There is some edema in the piriformis muscles bilaterally. A hematoma in the left obturator internus measures 2.9 cm AP x 2.1 cm transverse x 1.7 cm craniocaudal, slightly decreased since the prior exam. A smaller hematoma is seen in the left obturator externus. There is some fluid at the hamstring origins bilaterally compatible with strain or tendinosis. Soft tissues Imaged intrapelvic contents demonstrate no acute or focal abnormality. IMPRESSION: No acute abnormality. Status post bilateral sacroplasty. Left superior and inferior pubic ramus fractures as seen on prior CT. Small hematomas in the left operator internus obturator externus have slightly decreased in size since the prior exam. Fluid at the hamstring origins bilaterally compatible with strain or tendinosis, more notable on the left. Electronically Signed   By: Inge Rise M.D.   On: 07/17/2019 14:36        Scheduled  Meds: . Chlorhexidine Gluconate Cloth  6 each Topical Daily  . cholecalciferol  1,000 Units Oral Daily  . cyclobenzaprine  5 mg Oral TID  . enoxaparin (LOVENOX) injection  30 mg Subcutaneous Q24H  . escitalopram  5 mg Oral Daily  . feeding supplement (ENSURE ENLIVE)  237 mL Oral TID  . insulin aspart  0-9 Units Subcutaneous TID WC  . levothyroxine  25 mcg Oral Q0600  . lisinopril  40 mg Oral Daily  . pantoprazole  40 mg Oral Daily  . Plecanatide  3 mg Oral Daily  . senna-docusate  1 tablet Oral BID  . simvastatin  40 mg Oral QHS   Continuous Infusions:   LOS: 1 day    Time spent: 30 minutes    Barb Merino, MD [facility] Pager [phone]

## 2019-07-19 LAB — BASIC METABOLIC PANEL
Anion gap: 11 (ref 5–15)
BUN: 21 mg/dL (ref 8–23)
CO2: 23 mmol/L (ref 22–32)
Calcium: 8.8 mg/dL — ABNORMAL LOW (ref 8.9–10.3)
Chloride: 89 mmol/L — ABNORMAL LOW (ref 98–111)
Creatinine, Ser: 1.3 mg/dL — ABNORMAL HIGH (ref 0.44–1.00)
GFR calc Af Amer: 43 mL/min — ABNORMAL LOW (ref >60)
GFR calc non Af Amer: 37 mL/min — ABNORMAL LOW (ref >60)
Glucose, Bld: 234 mg/dL — ABNORMAL HIGH (ref 70–99)
Potassium: 4.6 mmol/L (ref 3.5–5.1)
Sodium: 123 mmol/L — ABNORMAL LOW (ref 135–145)

## 2019-07-19 LAB — GLUCOSE, CAPILLARY
Glucose-Capillary: 131 mg/dL — ABNORMAL HIGH (ref 70–99)
Glucose-Capillary: 188 mg/dL — ABNORMAL HIGH (ref 70–99)
Glucose-Capillary: 115 mg/dL — ABNORMAL HIGH (ref 70–99)
Glucose-Capillary: 141 mg/dL — ABNORMAL HIGH (ref 70–99)

## 2019-07-19 MED ORDER — BELLADONNA ALKALOIDS-OPIUM 16.2-30 MG RE SUPP
1.0000 | Freq: Four times a day (QID) | RECTAL | Status: DC | PRN
  Administered 2019-07-20: 1 via RECTAL
  Filled 2019-07-19 (×2): qty 1

## 2019-07-19 MED ORDER — MORPHINE SULFATE (PF) 2 MG/ML IV SOLN
1.0000 mg | INTRAVENOUS | Status: DC | PRN
  Administered 2019-07-19: 1 mg via INTRAVENOUS
  Filled 2019-07-19 (×2): qty 1

## 2019-07-19 MED ORDER — FENTANYL 12 MCG/HR TD PT72
1.0000 | MEDICATED_PATCH | TRANSDERMAL | Status: DC
  Administered 2019-07-19: 1 via TRANSDERMAL
  Filled 2019-07-19: qty 1

## 2019-07-19 MED ORDER — OXYCODONE HCL 5 MG PO TABS
5.0000 mg | ORAL_TABLET | ORAL | Status: DC | PRN
  Administered 2019-07-21 – 2019-07-22 (×4): 5 mg via ORAL
  Filled 2019-07-19 (×4): qty 1

## 2019-07-19 NOTE — Progress Notes (Signed)
PROGRESS NOTE  Lisa Mills OJJ:[id number] DOB: 1929/04/25 DOA: 07/17/2019 PCP: Olin Hauser, DO  Brief History   86 year old female with history of hypertension, GERD and diabetes on oral hypoglycemics at home, recent pelvic fracture and multiple hospitalizations, bilateral sacral alla fracture for which she underwent sacral plasty and discharged home.  Presented back 6/21-6/24 for severe constipation.  Discharge home again with home health PT OT.  Recurrent urinary retention, overflow incontinence, mobility troubles. Presented back to the emergency room with complaints of lower extremity weakness, urinary and fecal incontinence, worsening sacral area pain in the context of recent fracture, surgery.  In the emergency room, initially evaluated for cauda equina syndrome however patient with retention of urine and a Foley catheter was placed. MRI of the pelvis and lumbar spine showed multilevel degenerative changes in the spinal cord disease, no acute changes, no expanding cord compression. Patient lives at home with family support, recently having difficulty with pain management, mobility and bowel movements.  The patient was admitted to a medical bed. Urology was consulted. They have recommended leaving  Foley catheter in place until she followed up with urology as outpatient in 2 weeks for a voiding trial.  Orthopedic surgery was consulted regarding the patient's sacral pain. He did not feel that an epidural steroid injection could be done while the patient was inpatient.   She was also evaluated by neurosurgery who determined that there was no evidence for cauda equina syndrome. She was found to be neurologically intact.   Consultants  . Urology . Orthopedic surgery . Neurosurgery  Procedures  . None  Antibiotics   Anti-infectives (From admission, onward)   None    .  Subjective  The patient is resting quietly. No new complaints.  Objective   Vitals:  Vitals:     07/19/19 0826 07/19/19 1350  BP: (!) 150/78 123/60  Pulse:  79  Resp:  18  Temp:  98.4 F (36.9 C)  SpO2:  98%   Exam:  Constitutional:  The patient is awake, alert, and oriented x 3.Mild distress from urethral spasm Respiratory:  No increased work of breathing. No wheezes, rales, or rhonchi No tactile fremitus Cardiovascular:  Regular rate and rhythm No murmurs, ectopy, or gallups. No lateral PMI. No thrills. Abdomen:  Abdomen is soft, non-tender, non-distended No hernias, masses, or organomegaly Normoactive bowel sounds.  Musculoskeletal:  No cyanosis, clubbing, or edema Skin:  No rashes, lesions, ulcers palpation of skin: no induration or nodules Neurologic:  CN 2-12 intact Sensation all 4 extremities intact Psychiatric:  Mental status Mood, affect appropriate Orientation to person, place, time  judgment and insight appear intact  Scheduled Meds: . Chlorhexidine Gluconate Cloth  6 each Topical Daily  . cholecalciferol  1,000 Units Oral Daily  . cyclobenzaprine  5 mg Oral TID  . enoxaparin (LOVENOX) injection  30 mg Subcutaneous Q24H  . escitalopram  5 mg Oral Daily  . feeding supplement (ENSURE ENLIVE)  237 mL Oral TID  . fentaNYL  1 patch Transdermal Q72H  . glimepiride  2 mg Oral Q breakfast  . levothyroxine  25 mcg Oral Q0600  . lisinopril  40 mg Oral Daily  . pantoprazole  40 mg Oral Daily  . Plecanatide  3 mg Oral Daily  . senna-docusate  1 tablet Oral BID  . simvastatin  40 mg Oral QHS   Continuous Infusions:  Principal Problem:   Sacral pain Active Problems:   Type 2 diabetes mellitus with stage 4 chronic kidney disease ([place])  Hypothyroidism   GERD (gastroesophageal reflux disease)   Anemia in chronic kidney disease (CKD)   LOS: 2 days   A & P   Sacral pain: Acute on chronic sacral pain consistent with recent history of fracture, trauma and surgery.  Inadequate pain control. Seen by neurosurgery and orthopedics, no acute surgical  intervention needed. Fentanyl patch and B&O suppositories have been added to the patient's pain regimen with scheduled bowel regimen.  Work with PT OT.  Will refer to skilled nursing rehab for inpatient therapies.  Urinary obstruction: Suspect overflow incontinence after urinary retention secondary to decreased mobility from sacral pain and pelvic pain.  Recent urine culture with E. coli 10,000 colonies.  Urinalysis is clear. B&O suppository has been added as the patient seems to be having urethral spasms. Leave foley in place until she can follow up with urology as outpatient.   Type 2 diabetes with stage IV chronic kidney disease, controlled with hyperglycemia: Fairly stable.  On oral hypoglycemics at home.  Continue and also on sliding scale insulin.  Hypothyroidism: On Synthroid.  Constipation: High risk of constipation and recent hospitalization.  Scheduled stool softener and laxative.  GERD: On PPI continue.  I have seen and examined this patient myself. I have spent 38 minutes in her evaluation and care.  DVT prophylaxis: enoxaparin (LOVENOX) injection 30 mg Start: 07/17/19 2200 Code Status: Full code Family Communication: Patient's daughter at bedside Disposition Plan: Status is: Inpatient  Remains inpatient appropriate because:Unsafe d/c plan and Inpatient level of care appropriate due to severity of illness.   Dispo: The patient is from: Home  Anticipated d/c is to: SNF  Anticipated d/c date is: 2 day  Patient currently is medically stable to d/c.  Only to the skilled level of care.  Lisa Iodice, DO [facility] Direct contact: see [email or web address]  7PM-7AM contact night coverage as above 07/19/2019, 6:02 PM  LOS: 2 days

## 2019-07-19 NOTE — TOC Initial Note (Addendum)
Transition of Care [facility]) - Initial/Assessment Note    Patient Details  Name: Lisa Mills MRN: [id number] Date of Birth: 20-May-1929  Transition of Care [facility]) CM/SW Contact:    Candie Chroman, LCSW Phone Number: 07/19/2019, 11:57 AM  Clinical Narrative: CSW met with patient. Daughter at bedside. CSW introduced role and explained that PT recommendations would be discussed. Patient and her daughter agreeable to SNF placement. No facility preference. Provided CMS scores for SNF's within 25 miles of her zip code. They will review and pick out some top preferences. Patient has had both COVID vaccines. Made her aware of [facility]'s $10-$20 per day copay for the first 20 days and that it will increase to around $160 per day on day 21. Patient was set up with [facility] for [facility] health PT at her last admission. No further concerns. CSW encouraged patient and her daughter to contact CSW as needed. CSW will continue to follow patient and her daughter for support and facilitate discharge to SNF once medically stable.      4:12 pm: Provided patient with list of bed offers. She will review and CSW will follow up in the morning for choice.              Expected Discharge Plan: Skilled Nursing Facility Barriers to Discharge: Ship broker, Continued Medical Work up, SNF Pending bed offer   Patient Goals and CMS Choice Patient states their goals for this hospitalization and ongoing recovery are:: "If I could just get this pain under control I could go home." CMS Medicare.gov Compare Post Acute Care list provided to:: Patient (Daughter at bedside.)    Expected Discharge Plan and Services Expected Discharge Plan: [place] Acute Care Choice: [place] arrangements for the past 2 months: Single Family Home                                      Prior Living Arrangements/Services Living arrangements for the past 2 months: Single  Family Home Lives with:: Self Patient language and need for interpreter reviewed:: Yes Do you feel safe going back to the place where you live?: Yes      Need for Family Participation in Patient Care: Yes (Comment) Care giver support system in place?: Yes (comment) Current home services: Home PT, DME Criminal Activity/Legal Involvement Pertinent to Current Situation/Hospitalization: No - Comment as needed  Activities of Daily Living Home Assistive Devices/Equipment: Walker (specify type), Dentures (specify type) ADL Screening (condition at time of admission) Patient's cognitive ability adequate to safely complete daily activities?: Yes Is the patient deaf or have difficulty hearing?: No Does the patient have difficulty seeing, even when wearing glasses/contacts?: No Does the patient have difficulty concentrating, remembering, or making decisions?: Yes Patient able to express need for assistance with ADLs?: Yes Does the patient have difficulty dressing or bathing?: Yes Independently performs ADLs?: No Does the patient have difficulty walking or climbing stairs?: Yes Weakness of Legs: Both Weakness of Arms/Hands: None  Permission Sought/Granted Permission sought to share information with : Facility Sport and exercise psychologist, Family Supports Permission granted to share information with : Yes, Verbal Permission Granted     Permission granted to share info w AGENCY: SNF's  Permission granted to share info w Relationship: Daughter at bedside     Emotional Assessment Appearance:: Appears stated age Attitude/Demeanor/Rapport: Engaged, Gracious Affect (typically observed): Accepting, Appropriate, Calm,  Pleasant Orientation: : Oriented to Self, Oriented to Place, Oriented to  Time, Oriented to Situation Alcohol / Substance Use: Not Applicable Psych Involvement: No (comment)  Admission diagnosis:  Pelvic pain in female [R10.2] Sacral pain [M53.3] Patient Active Problem List   Diagnosis  Date Noted  . Sacral pain   . Radicular pain of sacrum 07/11/2019  . Sacral fracture, closed ([place]) 07/04/2019  . Fall 07/04/2019  . Bilateral pubic rami fractures, sequela 07/04/2019  . Background diabetic retinopathy ([place]) 01/23/2019  . Schatzki's ring 03/04/2017  . Post herpetic neuralgia 10/06/2016  . Hx of colonic polyps 09/22/2016  . Osteopenia 09/02/2016  . Anxiety associated with depression 08/25/2016  . Trapezius muscle spasm 05/25/2016  . Intermittent left lower quadrant abdominal pain 03/16/2016  . Anemia in chronic kidney disease (CKD) 02/26/2016  . Type 2 diabetes mellitus with stage 4 chronic kidney disease ([place]) 02/25/2016  . Hypothyroidism 02/25/2016  . Constipation 02/25/2016  . GERD (gastroesophageal reflux disease) 02/25/2016  . Hyperlipidemia associated with type 2 diabetes mellitus ([place]) 02/25/2016  . DJD (degenerative joint disease) of cervical spine 02/25/2016  . Osteoarthritis of multiple joints 02/25/2016  . Hiatal hernia 02/25/2016  . CKD stage 3 due to type 2 diabetes mellitus ([place]) 02/25/2016  . Chronic kidney disease, unspecified 09/05/2013  . Benign hypertension with CKD (chronic kidney disease) stage III 09/05/2013  . CMC arthritis, thumb, degenerative 06/15/2013  . Onychomycosis due to dermatophyte 05/25/2013  . Acquired keratoderma 05/25/2013   PCP:  Olin Hauser, DO Pharmacy:   [facility], [place] [address] Phone: [phone] Fax: [phone]     Social Determinants of Health (SDOH) Interventions    Readmission Risk Interventions No flowsheet data found.

## 2019-07-19 NOTE — NC FL2 (Signed)
[place] LEVEL OF CARE SCREENING TOOL     IDENTIFICATION  Patient Name: Lisa Mills Birthdate: November 15, 1929 Sex: female Admission Date (Current Location): 07/17/2019  [place] and [place] Number:  Engineering geologist and Address:  [facility], [name] [place], [place] [address]      Provider Number: [id number]  Attending Physician Name and Address:  Karie Kirks, DO  Relative Name and Phone Number:       Current Level of Care: Hospital Recommended Level of Care: [place] Prior Approval Number:    Date Approved/Denied:   PASRR Number: [id number]  Discharge Plan: SNF    Current Diagnoses: Patient Active Problem List   Diagnosis Date Noted  . Sacral pain   . Radicular pain of sacrum 07/11/2019  . Sacral fracture, closed ([place]) 07/04/2019  . Fall 07/04/2019  . Bilateral pubic rami fractures, sequela 07/04/2019  . Background diabetic retinopathy ([place]) 01/23/2019  . Schatzki's ring 03/04/2017  . Post herpetic neuralgia 10/06/2016  . Hx of colonic polyps 09/22/2016  . Osteopenia 09/02/2016  . Anxiety associated with depression 08/25/2016  . Trapezius muscle spasm 05/25/2016  . Intermittent left lower quadrant abdominal pain 03/16/2016  . Anemia in chronic kidney disease (CKD) 02/26/2016  . Type 2 diabetes mellitus with stage 4 chronic kidney disease ([place]) 02/25/2016  . Hypothyroidism 02/25/2016  . Constipation 02/25/2016  . GERD (gastroesophageal reflux disease) 02/25/2016  . Hyperlipidemia associated with type 2 diabetes mellitus ([place]) 02/25/2016  . DJD (degenerative joint disease) of cervical spine 02/25/2016  . Osteoarthritis of multiple joints 02/25/2016  . Hiatal hernia 02/25/2016  . CKD stage 3 due to type 2 diabetes mellitus ([place]) 02/25/2016  . Chronic kidney disease, unspecified 09/05/2013  . Benign hypertension with CKD (chronic kidney disease) stage III 09/05/2013  . CMC arthritis, thumb,  degenerative 06/15/2013  . Onychomycosis due to dermatophyte 05/25/2013  . Acquired keratoderma 05/25/2013    Orientation RESPIRATION BLADDER Height & Weight     Self, Time, Situation, Place  Normal Incontinent, Indwelling catheter Weight: 130 lb (59 kg) Height:  4' 11.5" (151.1 cm)  BEHAVIORAL SYMPTOMS/MOOD NEUROLOGICAL BOWEL NUTRITION STATUS     (None) Continent Diet (Carb modified. Fluid restriction 1200 mL.)  AMBULATORY STATUS COMMUNICATION OF NEEDS Skin   Limited Assist Verbally Surgical wounds                       Personal Care Assistance Level of Assistance  Bathing, Feeding, Dressing Bathing Assistance: Maximum assistance Feeding assistance: Limited assistance Dressing Assistance: Maximum assistance     Functional Limitations Info  Sight, Hearing, Speech Sight Info: Adequate Hearing Info: Adequate Speech Info: Adequate    SPECIAL CARE FACTORS FREQUENCY  PT (By licensed PT), OT (By licensed OT)     PT Frequency: 5 x week OT Frequency: 5 x week            Contractures Contractures Info: Not present    Additional Factors Info  Code Status, Allergies Code Status Info: Full code Allergies Info: Aspirin, Conray (Iothalamate), Dye Fdc Red (Red Dye), Sulfa Antibiotics, Prednisone           Current Medications (07/19/2019):  This is the current hospital active medication list Current Facility-Administered Medications  Medication Dose Route Frequency Provider Last Rate Last Admin  . acetaminophen (TYLENOL) tablet 1,000 mg  1,000 mg Oral Q6H PRN Barb Merino, MD   1,000 mg at 07/18/19 1612  . Chlorhexidine Gluconate Cloth 2 %  PADS 6 each  6 each Topical Daily Agbata, Tochukwu, MD      . cholecalciferol (VITAMIN D3) tablet 1,000 Units  1,000 Units Oral Daily Agbata, Tochukwu, MD   1,000 Units at 07/19/19 0825  . cyclobenzaprine (FLEXERIL) tablet 5 mg  5 mg Oral TID Agbata, Tochukwu, MD   5 mg at 07/19/19 0826  . enoxaparin (LOVENOX) injection 30 mg  30 mg  Subcutaneous Q24H Agbata, Tochukwu, MD   30 mg at 07/18/19 2011  . escitalopram (LEXAPRO) tablet 5 mg  5 mg Oral Daily Agbata, Tochukwu, MD   5 mg at 07/19/19 0825  . feeding supplement (ENSURE ENLIVE) (ENSURE ENLIVE) liquid 237 mL  237 mL Oral TID Agbata, Tochukwu, MD   237 mL at 07/18/19 2011  . glimepiride (AMARYL) tablet 2 mg  2 mg Oral Q breakfast Barb Merino, MD   2 mg at 07/19/19 0825  . levothyroxine (SYNTHROID) tablet 25 mcg  25 mcg Oral Q0600 Agbata, Tochukwu, MD   25 mcg at 07/19/19 0500  . lisinopril (ZESTRIL) tablet 40 mg  40 mg Oral Daily Agbata, Tochukwu, MD   40 mg at 07/19/19 0826  . melatonin tablet 10 mg  10 mg Oral QHS PRN Barb Merino, MD      . morphine 2 MG/ML injection 2 mg  2 mg Intravenous Q4H PRN Agbata, Tochukwu, MD   2 mg at 07/18/19 2132  . ondansetron (ZOFRAN) tablet 4 mg  4 mg Oral Q6H PRN Agbata, Tochukwu, MD       Or  . ondansetron (ZOFRAN) injection 4 mg  4 mg Intravenous Q6H PRN Agbata, Tochukwu, MD      . oxyCODONE (Oxy IR/ROXICODONE) immediate release tablet 5 mg  5 mg Oral Q4H PRN Barb Merino, MD   5 mg at 07/19/19 0826  . pantoprazole (PROTONIX) EC tablet 40 mg  40 mg Oral Daily Agbata, Tochukwu, MD   40 mg at 07/19/19 0825  . Plecanatide TABS 3 mg  3 mg Oral Daily Agbata, Tochukwu, MD      . polyethylene glycol (MIRALAX / GLYCOLAX) packet 17 g  17 g Oral Daily PRN Barb Merino, MD      . senna-docusate (Senokot-S) tablet 1 tablet  1 tablet Oral BID Barb Merino, MD   1 tablet at 07/19/19 0825  . simvastatin (ZOCOR) tablet 40 mg  40 mg Oral QHS Agbata, Tochukwu, MD   40 mg at 07/18/19 2011     Discharge Medications: Please see discharge summary for a list of discharge medications.  Relevant Imaging Results:  Relevant Lab Results:   Additional Information SS#: [id number]  Candie Chroman, LCSW

## 2019-07-20 ENCOUNTER — Inpatient Hospital Stay: Payer: PPO | Admitting: Family Medicine

## 2019-07-20 LAB — GLUCOSE, CAPILLARY
Glucose-Capillary: 110 mg/dL — ABNORMAL HIGH (ref 70–99)
Glucose-Capillary: 227 mg/dL — ABNORMAL HIGH (ref 70–99)
Glucose-Capillary: 232 mg/dL — ABNORMAL HIGH (ref 70–99)
Glucose-Capillary: 129 mg/dL — ABNORMAL HIGH (ref 70–99)

## 2019-07-20 LAB — BASIC METABOLIC PANEL
Anion gap: 9 (ref 5–15)
BUN: 26 mg/dL — ABNORMAL HIGH (ref 8–23)
CO2: 27 mmol/L (ref 22–32)
Calcium: 9 mg/dL (ref 8.9–10.3)
Chloride: 91 mmol/L — ABNORMAL LOW (ref 98–111)
Creatinine, Ser: 1.26 mg/dL — ABNORMAL HIGH (ref 0.44–1.00)
GFR calc Af Amer: 44 mL/min — ABNORMAL LOW (ref >60)
GFR calc non Af Amer: 38 mL/min — ABNORMAL LOW (ref >60)
Glucose, Bld: 126 mg/dL — ABNORMAL HIGH (ref 70–99)
Potassium: 4.6 mmol/L (ref 3.5–5.1)
Sodium: 127 mmol/L — ABNORMAL LOW (ref 135–145)

## 2019-07-20 MED ORDER — FENTANYL 12 MCG/HR TD PT72
2.0000 | MEDICATED_PATCH | TRANSDERMAL | Status: DC
  Filled 2019-07-20: qty 2

## 2019-07-20 NOTE — Care Management Important Message (Signed)
Important Message  Patient Details  Name: GERILYN STARGELL MRN: [id number] Date of Birth: 05-05-1929   Medicare Important Message Given:  Yes     Dannette Barbara 07/20/2019, 1:56 PM

## 2019-07-20 NOTE — TOC Progression Note (Addendum)
Transition of Care [facility]) - Progression Note    Patient Details  Name: Lisa Mills MRN: [id number] Date of Birth: 1929-10-13  Transition of Care [facility]) CM/SW [place], LCSW Phone Number:  07/20/2019, 10:07 AM  Clinical Narrative: Patient would like to accept bed offer from [facility]. CSW left message for admissions coordinator to notify.     10:52 am: Per MD, likely discharge tomorrow. SNF admissions coordinator is aware and agreeable. Called [employer] to start insurance authorization for SNF and EMS transport. Patient will not need a new COVID test since she has had her COVID vaccines.  Expected Discharge Plan: Skilled Nursing Facility Barriers to Discharge: Ship broker, Continued Medical Work up, SNF Pending bed offer  Expected Discharge Plan and Services Expected Discharge Plan: [place] Choice: [place] arrangements for the past 2 months: Single Family Home                                       Social Determinants of Health (SDOH) Interventions    Readmission Risk Interventions No flowsheet data found.

## 2019-07-20 NOTE — Progress Notes (Signed)
PROGRESS NOTE  Lisa Mills GGY:[id number] DOB: [birth year] DOA: 07/17/2019 PCP: Olin Hauser, DO  Brief History   86 year old female with history of hypertension, GERD and diabetes on oral hypoglycemics at home, recent pelvic fracture and multiple hospitalizations, bilateral sacral alla fracture for which she underwent sacral plasty and discharged home.  Presented back 6/21-6/24 for severe constipation.  Discharge home again with home health PT OT.  Recurrent urinary retention, overflow incontinence, mobility troubles. Presented back to the emergency room with complaints of lower extremity weakness, urinary and fecal incontinence, worsening sacral area pain in the context of recent fracture, surgery.  In the emergency room, initially evaluated for cauda equina syndrome however patient with retention of urine and a Foley catheter was placed. MRI of the pelvis and lumbar spine showed multilevel degenerative changes in the spinal cord disease, no acute changes, no expanding cord compression. Patient lives at home with family support, recently having difficulty with pain management, mobility and bowel movements.  The patient was admitted to a medical bed. Urology was consulted. They have recommended leaving  Foley catheter in place until she followed up with urology as outpatient in 2 weeks for a voiding trial.  Orthopedic surgery was consulted regarding the patient's sacral pain. He did not feel that an epidural steroid injection could be done while the patient was inpatient.   She was also evaluated by neurosurgery who determined that there was no evidence for cauda equina syndrome. She was found to be neurologically intact.   Consultants  . Urology . Orthopedic surgery . Neurosurgery  Procedures  . None  Antibiotics   Anti-infectives (From admission, onward)   None     Subjective  The patient is resting quietly. She continues to complain of pain from the foley  catheter and in her sacrum, although pain is not her first complaint upon my entering the room as it was yesterday.  Objective   Vitals:  Vitals:   07/20/19 0932 07/20/19 1145  BP: (!) 145/74 121/69  Pulse:  81  Resp:  14  Temp:  98.6 F (37 C)  SpO2:  98%   Exam:  Constitutional:  The patient is awake, alert, and oriented x 3. She is complaining of difficulty getting someone to help her get situated in bed. Respiratory:  No increased work of breathing. No wheezes, rales, or rhonchi No tactile fremitus Cardiovascular:  Regular rate and rhythm No murmurs, ectopy, or gallups. No lateral PMI. No thrills. Abdomen:  Abdomen is soft, non-tender, non-distended No hernias, masses, or organomegaly Normoactive bowel sounds.  Musculoskeletal:  No cyanosis, clubbing, or edema Skin:  No rashes, lesions, ulcers palpation of skin: no induration or nodules Neurologic:  CN 2-12 intact Sensation all 4 extremities intact Psychiatric:  Mental status Mood, affect appropriate Orientation to person, place, time  judgment and insight appear intact  Scheduled Meds: . Chlorhexidine Gluconate Cloth  6 each Topical Daily  . cholecalciferol  1,000 Units Oral Daily  . cyclobenzaprine  5 mg Oral TID  . enoxaparin (LOVENOX) injection  30 mg Subcutaneous Q24H  . escitalopram  5 mg Oral Daily  . feeding supplement (ENSURE ENLIVE)  237 mL Oral TID  . [START ON 07/22/2019] fentaNYL  2 patch Transdermal Q72H  . glimepiride  2 mg Oral Q breakfast  . levothyroxine  25 mcg Oral Q0600  . lisinopril  40 mg Oral Daily  . pantoprazole  40 mg Oral Daily  . Plecanatide  3 mg Oral Daily  . senna-docusate  1 tablet Oral BID  . simvastatin  40 mg Oral QHS   Continuous Infusions:  Principal Problem:   Sacral pain Active Problems:   Type 2 diabetes mellitus with stage 4 chronic kidney disease (HCC)   Hypothyroidism   GERD (gastroesophageal reflux disease)   Anemia in chronic kidney disease (CKD)    LOS: 3 days   A & P   Sacral pain: Acute on chronic sacral pain consistent with recent history of fracture, trauma and surgery.  Inadequate pain control. I had added a 12.5 mcg patch yesterday. I have added a second patch today. Seen by neurosurgery and orthopedics, no acute surgical intervention needed. Fentanyl patch and B&O suppositories have been added to the patient's pain regimen with scheduled bowel regimen.  Work with PT OT.  Will refer to skilled nursing rehab for inpatient therapies.  Urinary obstruction: Suspect overflow incontinence after urinary retention secondary to decreased mobility from sacral pain and pelvic pain.  Recent urine culture with E. coli 10,000 colonies.  Urinalysis is clear. B&O suppository has been added as the patient seems to be having urethral spasms. Leave foley in place until she can follow up with urology as outpatient.   Type 2 diabetes with stage IV chronic kidney disease, controlled with hyperglycemia: Fairly stable.  On oral hypoglycemics at home.  Continue and also on sliding scale insulin. Glucoses have been 110-227 in the last 24 hours.  Hypothyroidism: On Synthroid.  Constipation: High risk of constipation and recent hospitalization.  Scheduled stool softener and laxative.  GERD: On PPI continue.  I have seen and examined this patient myself. I have spent 32 minutes in her evaluation and care.  DVT prophylaxis: enoxaparin (LOVENOX) injection 30 mg Start: 07/17/19 2200 Code Status: Full code Family Communication: Patient's daughter at bedside Disposition Plan: Status is: Inpatient  Remains inpatient appropriate because:Unsafe d/c plan and Inpatient level of care appropriate due to severity of illness.  Dispo: The patient is from: Home  Anticipated d/c is to: SNF  Anticipated d/c date is: 2 day  Patient currently is medically stable to d/c.  Only to the skilled level of care. Barrier to discharge  includes improved control of pain.  Lamel Mccarley, DO [facility] Direct contact: see [email or web address]  7PM-7AM contact night coverage as above 07/20/2019, 4:18 PM  LOS: 2 days

## 2019-07-20 NOTE — Progress Notes (Signed)
Physical Therapy Treatment Patient Details Name: Lisa Mills MRN: [id number] DOB: 1929-11-28 Today's Date: 07/20/2019    History of Present Illness Pt is 86 yo female that presented to ED due to severe constipation/urinary retention. PMH of hypertension, GERD and diabetes on oral hypoglycemics at home, bilateral sacral alla fracture for which she underwent sacral plasty and discharged home 07/06/2019. Multiple ED visits noted in the last 6 weeks.    PT Comments    Pt alert, eager to attempt out of bed mobility, very motivated to return to PLOF. Pt with anxiety with all movements, encouragement and motivation from PT throughout session. The patient was able to perform supine exercises with verbal/tactile cueing for LEs. Log roll technique with minA to sit EOB, pt complained of severe pain at catheter site in sitting. Sit <> stand and ambulated ~79ft in room with RW and CGA, fatigued quickly and reported pain 5/10. Pt up in chair (Extensive repositioning, pillows, and time needed to maximize pt comfort) with nurse tech at bedside. The patient would benefit from further skilled PT intervention to continue to progress towards goals. Recommendation remains appropriate.     Follow Up Recommendations  SNF     Equipment Recommendations  None recommended by PT    Recommendations for Other Services       Precautions / Restrictions Precautions Precautions: Fall Restrictions Weight Bearing Restrictions: No    Mobility  Bed Mobility Overal bed mobility: Needs Assistance Bed Mobility: Rolling;Sidelying to Sit Rolling: Min guard Sidelying to sit: Min assist       General bed mobility comments: reliant on bed rails for bed mobility, very light minA to come to fully seated position due to pain  Transfers Overall transfer level: Needs assistance Equipment used: Rolling walker (2 wheeled) Transfers: Sit to/from Stand Sit to Stand: Min guard;From elevated surface         General  transfer comment: extended time needed, reliant on UE support  Ambulation/Gait Ambulation/Gait assistance: Min guard Gait Distance (Feet): 30 Feet Assistive device: Rolling walker (2 wheeled) Gait Pattern/deviations: Step-through pattern;Decreased step length - right;Decreased step length - left;Decreased stride length;Shuffle Gait velocity: decr   General Gait Details: fatigued quickly   Stairs             Wheelchair Mobility    Modified Rankin (Stroke Patients Only)       Balance Overall balance assessment: Needs assistance Sitting-balance support: Feet supported Sitting balance-Leahy Scale: Fair Sitting balance - Comments: good static sitting at EOB, used BUE to relieve some sacral pressure in sitting   Standing balance support: Bilateral upper extremity supported;During functional activity Standing balance-Leahy Scale: Good Standing balance comment: reliant on RW for pain control and safety                            Cognition Arousal/Alertness: Awake/alert Behavior During Therapy: WFL for tasks assessed/performed;Anxious Overall Cognitive Status: Within Functional Limits for tasks assessed                                 General Comments: pt A&O, anxiety around movement and pain      Exercises General Exercises - Lower Extremity Ankle Circles/Pumps: AROM;Strengthening;Both;10 reps Heel Slides: AROM;Strengthening;Both;15 reps Hip ABduction/ADduction: AROM;Strengthening;Both;15 reps Other Exercises Other Exercises: Pt up in chair, extensive pillows used for comfort and repositioning needed.    General Comments  Pertinent Vitals/Pain Pain Assessment: 0-10 Pain Score: 5  Pain Location: sacrum during/post ambulation Pain Descriptors / Indicators: Sore;Aching;Grimacing;Discomfort Pain Intervention(s): Limited activity within patient's tolerance;Monitored during session;Repositioned    Home Living                       Prior Function            PT Goals (current goals can now be found in the care plan section) Progress towards PT goals: Progressing toward goals    Frequency    Min 2X/week      PT Plan Current plan remains appropriate    Co-evaluation              AM-PAC PT "6 Clicks" Mobility   Outcome Measure  Help needed turning from your back to your side while in a flat bed without using bedrails?: A Little Help needed moving from lying on your back to sitting on the side of a flat bed without using bedrails?: A Lot Help needed moving to and from a bed to a chair (including a wheelchair)?: A Little Help needed standing up from a chair using your arms (e.g., wheelchair or bedside chair)?: A Little Help needed to walk in hospital room?: A Lot Help needed climbing 3-5 steps with a railing? : A Lot 6 Click Score: 15    End of Session Equipment Utilized During Treatment: Gait belt Activity Tolerance: Patient limited by pain Patient left: in chair;with call bell/phone within reach;with nursing/sitter in room Nurse Communication: Mobility status PT Visit Diagnosis: Unsteadiness on feet (R26.81);Muscle weakness (generalized) (M62.81);Other abnormalities of gait and mobility (R26.89);Pain Pain - Right/Left:  (bilateral) Pain - part of body:  (low back/hips)     Time: 2585-2778 PT Time Calculation (min) (ACUTE ONLY): 28 min  Charges:  $Therapeutic Exercise: 23-37 mins                     Lieutenant Diego PT, DPT 10:57 AM,07/20/19

## 2019-07-21 LAB — BASIC METABOLIC PANEL
Anion gap: 11 (ref 5–15)
BUN: 33 mg/dL — ABNORMAL HIGH (ref 8–23)
CO2: 27 mmol/L (ref 22–32)
Calcium: 9.1 mg/dL (ref 8.9–10.3)
Chloride: 90 mmol/L — ABNORMAL LOW (ref 98–111)
Creatinine, Ser: 1.48 mg/dL — ABNORMAL HIGH (ref 0.44–1.00)
GFR calc Af Amer: 37 mL/min — ABNORMAL LOW (ref >60)
GFR calc non Af Amer: 32 mL/min — ABNORMAL LOW (ref >60)
Glucose, Bld: 151 mg/dL — ABNORMAL HIGH (ref 70–99)
Potassium: 4.7 mmol/L (ref 3.5–5.1)
Sodium: 128 mmol/L — ABNORMAL LOW (ref 135–145)

## 2019-07-21 LAB — GLUCOSE, CAPILLARY
Glucose-Capillary: 123 mg/dL — ABNORMAL HIGH (ref 70–99)
Glucose-Capillary: 131 mg/dL — ABNORMAL HIGH (ref 70–99)
Glucose-Capillary: 155 mg/dL — ABNORMAL HIGH (ref 70–99)
Glucose-Capillary: 172 mg/dL — ABNORMAL HIGH (ref 70–99)

## 2019-07-21 MED ORDER — OXYBUTYNIN CHLORIDE 5 MG PO TABS
5.0000 mg | ORAL_TABLET | Freq: Four times a day (QID) | ORAL | Status: DC
  Administered 2019-07-21 – 2019-07-24 (×14): 5 mg via ORAL
  Filled 2019-07-21 (×14): qty 1

## 2019-07-21 NOTE — TOC Progression Note (Addendum)
Transition of Care [facility]) - Progression Note    Patient Details  Name: Lisa Mills MRN: [id number] Date of Birth: 02-09-1929  Transition of Care [facility]) CM/SW [place], LCSW Phone Number: 07/21/2019, 11:52 AM  Clinical Narrative:  Insurance authorization approved for SNF placement [phone] for 7 days) and EMS transport [phone]). SNF admissions coordinator aware. Sent secure chat to MD to notify.   12:17 pm: Per MD, patient will likely discharge tomorrow instead to try and sort out foley pain. SNF is aware and agreeable. CSW met with patient to provide update. Let her know that she will not have a daily copay with [employer] but will have a copay of $250 for EMS transport.  Expected Discharge Plan: Skilled Nursing Facility Barriers to Discharge: Ship broker, Continued Medical Work up, SNF Pending bed offer  Expected Discharge Plan and Services Expected Discharge Plan: [place] Choice: [place] arrangements for the past 2 months: Single Family Home                                       Social Determinants of Health (SDOH) Interventions    Readmission Risk Interventions No flowsheet data found.

## 2019-07-21 NOTE — Progress Notes (Signed)
PT Cancellation Note  Patient Details Name: Lisa Mills MRN: [id number] DOB: [birth year]   Cancelled Treatment:     PT attempt. Pt refused 2/2 to c/o pain. Encouragement given to attempt OOB activity but pt unwilling. " It just hurts to bad to move right now." pt referring to pain in vagina/foley insertion site. She reports she is planning to DC to SNF tomorrow and ask several questions about what to expect from PT going forward. Acute PT will continue to follow per POC.    Willette Pa 07/21/2019, 2:21 PM

## 2019-07-22 LAB — BASIC METABOLIC PANEL
Anion gap: 11 (ref 5–15)
Anion gap: 9 (ref 5–15)
BUN: 54 mg/dL — ABNORMAL HIGH (ref 8–23)
BUN: 55 mg/dL — ABNORMAL HIGH (ref 8–23)
CO2: 27 mmol/L (ref 22–32)
CO2: 28 mmol/L (ref 22–32)
Calcium: 9 mg/dL (ref 8.9–10.3)
Calcium: 8.8 mg/dL — ABNORMAL LOW (ref 8.9–10.3)
Chloride: 86 mmol/L — ABNORMAL LOW (ref 98–111)
Chloride: 89 mmol/L — ABNORMAL LOW (ref 98–111)
Creatinine, Ser: 1.75 mg/dL — ABNORMAL HIGH (ref 0.44–1.00)
Creatinine, Ser: 1.58 mg/dL — ABNORMAL HIGH (ref 0.44–1.00)
GFR calc Af Amer: 30 mL/min — ABNORMAL LOW (ref >60)
GFR calc Af Amer: 34 mL/min — ABNORMAL LOW (ref >60)
GFR calc non Af Amer: 26 mL/min — ABNORMAL LOW (ref >60)
GFR calc non Af Amer: 29 mL/min — ABNORMAL LOW (ref >60)
Glucose, Bld: 165 mg/dL — ABNORMAL HIGH (ref 70–99)
Glucose, Bld: 149 mg/dL — ABNORMAL HIGH (ref 70–99)
Potassium: 5.6 mmol/L — ABNORMAL HIGH (ref 3.5–5.1)
Potassium: 5.3 mmol/L — ABNORMAL HIGH (ref 3.5–5.1)
Sodium: 124 mmol/L — ABNORMAL LOW (ref 135–145)
Sodium: 126 mmol/L — ABNORMAL LOW (ref 135–145)

## 2019-07-22 LAB — GLUCOSE, CAPILLARY
Glucose-Capillary: 144 mg/dL — ABNORMAL HIGH (ref 70–99)
Glucose-Capillary: 227 mg/dL — ABNORMAL HIGH (ref 70–99)
Glucose-Capillary: 130 mg/dL — ABNORMAL HIGH (ref 70–99)
Glucose-Capillary: 145 mg/dL — ABNORMAL HIGH (ref 70–99)

## 2019-07-22 MED ORDER — SODIUM CHLORIDE 0.9 % IV SOLN: INTRAVENOUS | Status: DC

## 2019-07-22 MED ORDER — FENTANYL 25 MCG/HR TD PT72
1.0000 | MEDICATED_PATCH | TRANSDERMAL | Status: DC
  Administered 2019-07-22: 1 via TRANSDERMAL
  Filled 2019-07-22: qty 1

## 2019-07-22 NOTE — Progress Notes (Signed)
PROGRESS NOTE  Lisa Mills VCB:[id number] DOB: 06/11/1929 DOA: 07/17/2019 PCP: Olin Hauser, DO  Brief History   86 year old female with history of hypertension, GERD and diabetes on oral hypoglycemics at home, recent pelvic fracture and multiple hospitalizations, bilateral sacral alla fracture for which she underwent sacral plasty and discharged home.  Presented back 6/21-6/24 for severe constipation.  Discharge home again with home health PT OT.  Recurrent urinary retention, overflow incontinence, mobility troubles. Presented back to the emergency room with complaints of lower extremity weakness, urinary and fecal incontinence, worsening sacral area pain in the context of recent fracture, surgery.  In the emergency room, initially evaluated for cauda equina syndrome however patient with retention of urine and a Foley catheter was placed. MRI of the pelvis and lumbar spine showed multilevel degenerative changes in the spinal cord disease, no acute changes, no expanding cord compression. Patient lives at home with family support, recently having difficulty with pain management, mobility and bowel movements.  The patient was admitted to a medical bed. Urology was consulted. They have recommended leaving  Foley catheter in place until she followed up with urology as outpatient in 2 weeks for a voiding trial.  Orthopedic surgery was consulted regarding the patient's sacral pain. He did not feel that an epidural steroid injection could be done while the patient was inpatient.   She was also evaluated by neurosurgery who determined that there was no evidence for cauda equina syndrome. She was found to be neurologically intact.   Consultants  . Urology . Orthopedic surgery . Neurosurgery  Procedures  . None  Antibiotics   Anti-infectives (From admission, onward)   None     Subjective  The patient is resting quietly. She continues to complain of pain from the foley  catheter, although she states that is only when she is getting in and out of bed now. Pain in sacrum appers to be less of an issue today.  Objective   Vitals:  Vitals:   07/22/19 0419 07/22/19 1143  BP: (!) 90/44 (!) 94/45  Pulse: 82 85  Resp: 16   Temp: 98.3 F (36.8 C) 98 F (36.7 C)  SpO2: 92% 91%   Exam:  Constitutional:  The patient is awake, alert, and oriented x 3. Mild distress from pain from foley catheter. Respiratory:  No increased work of breathing. No wheezes, rales, or rhonchi No tactile fremitus Cardiovascular:  Regular rate and rhythm No murmurs, ectopy, or gallups. No lateral PMI. No thrills. Abdomen:  Abdomen is soft, non-tender, non-distended No hernias, masses, or organomegaly Normoactive bowel sounds.  Musculoskeletal:  No cyanosis, clubbing, or edema Skin:  No rashes, lesions, ulcers palpation of skin: no induration or nodules Neurologic:  CN 2-12 intact Sensation all 4 extremities intact Psychiatric:  Mental status Mood, affect appropriate Orientation to person, place, time  judgment and insight appear intact  Scheduled Meds: . Chlorhexidine Gluconate Cloth  6 each Topical Daily  . cholecalciferol  1,000 Units Oral Daily  . cyclobenzaprine  5 mg Oral TID  . enoxaparin (LOVENOX) injection  30 mg Subcutaneous Q24H  . escitalopram  5 mg Oral Daily  . feeding supplement (ENSURE ENLIVE)  237 mL Oral TID  . fentaNYL  2 patch Transdermal Q72H  . glimepiride  2 mg Oral Q breakfast  . levothyroxine  25 mcg Oral Q0600  . oxybutynin  5 mg Oral QID  . pantoprazole  40 mg Oral Daily  . Plecanatide  3 mg Oral Daily  . senna-docusate  1 tablet Oral BID  . simvastatin  40 mg Oral QHS   Continuous Infusions: . sodium chloride 100 mL/hr at 07/22/19 1232    Principal Problem:   Sacral pain Active Problems:   Type 2 diabetes mellitus with stage 4 chronic kidney disease (HCC)   Hypothyroidism   GERD (gastroesophageal reflux disease)   Anemia in  chronic kidney disease (CKD)   LOS: 5 days   A & P   Sacral pain: Acute on chronic sacral pain consistent with recent history of fracture, trauma and surgery.  Inadequate pain control. I had added a 12.5 mcg patch on 07/19/2019. I have added a second patch yesterday. Seen by neurosurgery and orthopedics, no acute surgical intervention needed. Fentanyl patch and B&O suppositories have been added to the patient's pain regimen with scheduled bowel regimen.  Work with PT OT.  Will refer to skilled nursing rehab for inpatient therapies.  Urinary retention: Suspect overflow incontinence after urinary retention secondary to decreased mobility from sacral pain and pelvic pain.  Recent urine culture with E. coli 10,000 colonies.  Urinalysis is clear. B&O suppository does not seem to have helped with discomfort. Oxybutinin added. Leave foley in place for two weeks or until she can follow up with urology as outpatient.   AKI: Creatinine has been slowly increasing. Today up to 1.78 with elevation of potassium to 5.6. She is on lisinopril 40 mg daily. This will be held, and she will receive IV fluids.   Hyponatremia: Had improved with fluid restriction, but now lower to 123. Will give IV NS and monitor carefully. Will recheck tonight.  Type 2 diabetes with stage IV chronic kidney disease, controlled with hyperglycemia: Fairly stable.  On oral hypoglycemics at home.  Continue and also on sliding scale insulin. Glucoses have been 121- 232 in the last 24 hours.  Hypothyroidism: On Synthroid.  Constipation: High risk of constipation and recent hospitalization.  Scheduled stool softener and laxative.  GERD: On PPI continue.  I have seen and examined this patient myself. I have spent 30 minutes in her evaluation and care.  DVT prophylaxis: enoxaparin (LOVENOX) injection 30 mg Start: 07/17/19 2200 Code Status: Full code Family Communication: Patient's daughter at bedside Disposition Plan: Status is:  Inpatient  Remains inpatient appropriate because:Unsafe d/c plan and Inpatient level of care appropriate due to severity of illness.  Dispo: The patient is from: Home  Anticipated d/c is to: SNF  Anticipated d/c date is: 2 day  Patient currently is not medically stable to d/c.  Only to the skilled level of care. Barrier to discharge includes worsening hyponatremia and renal insufficiency with hyperkalemia.  Mlissa Tamayo, DO [facility] Direct contact: see [email or web address]  7PM-7AM contact night coverage as above 07/22/2019, 1:05 PM  LOS: 2 days

## 2019-07-22 NOTE — Progress Notes (Signed)
PROGRESS NOTE  Lisa Mills:[id number] DOB: 05-01-1929 DOA: 07/17/2019 PCP: Olin Hauser, DO  Brief History   86 year old female with history of hypertension, GERD and diabetes on oral hypoglycemics at home, recent pelvic fracture and multiple hospitalizations, bilateral sacral alla fracture for which she underwent sacral plasty and discharged home.  Presented back 6/21-6/24 for severe constipation.  Discharge home again with home health PT OT.  Recurrent urinary retention, overflow incontinence, mobility troubles. Presented back to the emergency room with complaints of lower extremity weakness, urinary and fecal incontinence, worsening sacral area pain in the context of recent fracture, surgery.  In the emergency room, initially evaluated for cauda equina syndrome however patient with retention of urine and a Foley catheter was placed. MRI of the pelvis and lumbar spine showed multilevel degenerative changes in the spinal cord disease, no acute changes, no expanding cord compression. Patient lives at home with family support, recently having difficulty with pain management, mobility and bowel movements.  The patient was admitted to a medical bed. Urology was consulted. They have recommended leaving  Foley catheter in place until she followed up with urology as outpatient in 2 weeks for a voiding trial.  Orthopedic surgery was consulted regarding the patient's sacral pain. He did not feel that an epidural steroid injection could be done while the patient was inpatient.   She was also evaluated by neurosurgery who determined that there was no evidence for cauda equina syndrome. She was found to be neurologically intact.   Consultants  . Urology . Orthopedic surgery . Neurosurgery  Procedures  . None  Antibiotics   Anti-infectives (From admission, onward)   None     Subjective  The patient is resting quietly. She continues to complain of pain from the foley  catheter. Pain in sacrum appers to be less of a hindrance to her participation in therapy.  Objective   Vitals:  Vitals:   07/22/19 0419 07/22/19 1143  BP: (!) 90/44 (!) 94/45  Pulse: 82 85  Resp: 16   Temp: 98.3 F (36.8 C) 98 F (36.7 C)  SpO2: 92% 91%   Exam:  Constitutional:  The patient is awake, alert, and oriented x 3. Mild distress from pain from foley catheter. Respiratory:  No increased work of breathing. No wheezes, rales, or rhonchi No tactile fremitus Cardiovascular:  Regular rate and rhythm No murmurs, ectopy, or gallups. No lateral PMI. No thrills. Abdomen:  Abdomen is soft, non-tender, non-distended No hernias, masses, or organomegaly Normoactive bowel sounds.  Musculoskeletal:  No cyanosis, clubbing, or edema Skin:  No rashes, lesions, ulcers palpation of skin: no induration or nodules Neurologic:  CN 2-12 intact Sensation all 4 extremities intact Psychiatric:  Mental status Mood, affect appropriate Orientation to person, place, time  judgment and insight appear intact  Scheduled Meds: . Chlorhexidine Gluconate Cloth  6 each Topical Daily  . cholecalciferol  1,000 Units Oral Daily  . cyclobenzaprine  5 mg Oral TID  . enoxaparin (LOVENOX) injection  30 mg Subcutaneous Q24H  . escitalopram  5 mg Oral Daily  . feeding supplement (ENSURE ENLIVE)  237 mL Oral TID  . fentaNYL  2 patch Transdermal Q72H  . glimepiride  2 mg Oral Q breakfast  . levothyroxine  25 mcg Oral Q0600  . oxybutynin  5 mg Oral QID  . pantoprazole  40 mg Oral Daily  . Plecanatide  3 mg Oral Daily  . senna-docusate  1 tablet Oral BID  . simvastatin  40 mg Oral  QHS   Continuous Infusions: . sodium chloride 100 mL/hr at 07/22/19 1232    Principal Problem:   Sacral pain Active Problems:   Type 2 diabetes mellitus with stage 4 chronic kidney disease (HCC)   Hypothyroidism   GERD (gastroesophageal reflux disease)   Anemia in chronic kidney disease (CKD)   LOS: 5 days     A & P   Sacral pain: Acute on chronic sacral pain consistent with recent history of fracture, trauma and surgery.  Inadequate pain control. I had added a 12.5 mcg patch on 07/19/2019. I have added a second patch yesterday. Seen by neurosurgery and orthopedics, no acute surgical intervention needed. Fentanyl patch and B&O suppositories have been added to the patient's pain regimen with scheduled bowel regimen.  Work with PT OT.  Will refer to skilled nursing rehab for inpatient therapies.  Urinary retention: Suspect overflow incontinence after urinary retention secondary to decreased mobility from sacral pain and pelvic pain.  Recent urine culture with E. coli 10,000 colonies.  Urinalysis is clear. B&O suppository does not seem to have helped with discomfort. Oxybutinin added. Leave foley in place for two weeks or until she can follow up with urology as outpatient.   Type 2 diabetes with stage IV chronic kidney disease, controlled with hyperglycemia: Fairly stable.  On oral hypoglycemics at home.  Continue and also on sliding scale insulin. Glucoses have been 121- 232 in the last 24 hours.  Hypothyroidism: On Synthroid.  Constipation: High risk of constipation and recent hospitalization.  Scheduled stool softener and laxative.  GERD: On PPI continue.  I have seen and examined this patient myself. I have spent 30 minutes in her evaluation and care.  DVT prophylaxis: enoxaparin (LOVENOX) injection 30 mg Start: 07/17/19 2200 Code Status: Full code Family Communication: Patient's daughter at bedside Disposition Plan: Status is: Inpatient  Remains inpatient appropriate because:Unsafe d/c plan and Inpatient level of care appropriate due to severity of illness.  Dispo: The patient is from: Home  Anticipated d/c is to: SNF  Anticipated d/c date is: 2 day  Patient currently is medically stable to d/c.  Only to the skilled level of care. Barrier to  discharge includes improved control of pain.  Beauregard Jarrells, DO [facility] Direct contact: see [email or web address]  7PM-7AM contact night coverage as above 07/21/2019, 1:01 PM  LOS: 2 days

## 2019-07-22 NOTE — TOC Progression Note (Signed)
Transition of Care [facility]) - Progression Note    Patient Details  Name: Lisa Mills MRN: [id number] Date of Birth: [birth year]  Transition of Care [facility]) CM/SW Contact  Elliot Gurney [place], [place] Phone Number: [phone] 07/22/2019, 12:25 PM  Clinical Narrative:    Per MD, patient not stable for discharge today. [facility] notified.  Expected Discharge Plan: Skilled Nursing Facility Barriers to Discharge: Ship broker, Continued Medical Work up, SNF Pending bed offer  Expected Discharge Plan and Services Expected Discharge Plan: [place] Choice: [place] arrangements for the past 2 months: Single Family Home                                       Social Determinants of Health (SDOH) Interventions    Readmission Risk Interventions No flowsheet data found.

## 2019-07-23 LAB — BASIC METABOLIC PANEL
Anion gap: 9 (ref 5–15)
BUN: 47 mg/dL — ABNORMAL HIGH (ref 8–23)
CO2: 28 mmol/L (ref 22–32)
Calcium: 9 mg/dL (ref 8.9–10.3)
Chloride: 92 mmol/L — ABNORMAL LOW (ref 98–111)
Creatinine, Ser: 1.37 mg/dL — ABNORMAL HIGH (ref 0.44–1.00)
GFR calc Af Amer: 40 mL/min — ABNORMAL LOW (ref >60)
GFR calc non Af Amer: 35 mL/min — ABNORMAL LOW (ref >60)
Glucose, Bld: 157 mg/dL — ABNORMAL HIGH (ref 70–99)
Potassium: 4.6 mmol/L (ref 3.5–5.1)
Sodium: 129 mmol/L — ABNORMAL LOW (ref 135–145)

## 2019-07-23 LAB — GLUCOSE, CAPILLARY
Glucose-Capillary: 135 mg/dL — ABNORMAL HIGH (ref 70–99)
Glucose-Capillary: 168 mg/dL — ABNORMAL HIGH (ref 70–99)
Glucose-Capillary: 108 mg/dL — ABNORMAL HIGH (ref 70–99)
Glucose-Capillary: 133 mg/dL — ABNORMAL HIGH (ref 70–99)

## 2019-07-23 MED ORDER — LACTULOSE 10 GM/15ML PO SOLN
10.0000 g | Freq: Two times a day (BID) | ORAL | Status: DC
  Administered 2019-07-23 – 2019-07-24 (×3): 10 g via ORAL
  Filled 2019-07-23 (×3): qty 30

## 2019-07-23 NOTE — Progress Notes (Signed)
PROGRESS NOTE  Lisa Mills ZHG:[id number] DOB: 29-Sep-1929 DOA: 07/17/2019 PCP: Olin Hauser, DO  Brief History   86 year old female with history of hypertension, GERD and diabetes on oral hypoglycemics at home, recent pelvic fracture and multiple hospitalizations, bilateral sacral alla fracture for which she underwent sacral plasty and discharged home.  Presented back 6/21-6/24 for severe constipation.  Discharge home again with home health PT OT.  Recurrent urinary retention, overflow incontinence, mobility troubles. Presented back to the emergency room with complaints of lower extremity weakness, urinary and fecal incontinence, worsening sacral area pain in the context of recent fracture, surgery.  In the emergency room, initially evaluated for cauda equina syndrome however patient with retention of urine and a Foley catheter was placed. MRI of the pelvis and lumbar spine showed multilevel degenerative changes in the spinal cord disease, no acute changes, no expanding cord compression. Patient lives at home with family support, recently having difficulty with pain management, mobility and bowel movements.  The patient was admitted to a medical bed. Urology was consulted. They have recommended leaving  Foley catheter in place until she followed up with urology as outpatient in 2 weeks for a voiding trial.  Orthopedic surgery was consulted regarding the patient's sacral pain. He did not feel that an epidural steroid injection could be done while the patient was inpatient.   She was also evaluated by neurosurgery who determined that there was no evidence for cauda equina syndrome. She was found to be neurologically intact.   Consultants  . Urology . Orthopedic surgery . Neurosurgery  Procedures  . None  Antibiotics   Anti-infectives (From admission, onward)   None     Subjective  The patient is resting quietly.No new complaints. Pain in sacrum appers to be less of an  issue today.  Objective   Vitals:  Vitals:   07/22/19 2021 07/23/19 0335  BP: 106/70 (!) 127/51  Pulse: 81 80  Resp: 20 20  Temp: 98.6 F (37 C) 97.8 F (36.6 C)  SpO2: 95% 93%   Exam:  Constitutional:  The patient is awake, alert, and oriented x 3. Mild distress from pain from foley catheter. Respiratory:  No increased work of breathing. No wheezes, rales, or rhonchi No tactile fremitus Cardiovascular:  Regular rate and rhythm No murmurs, ectopy, or gallups. No lateral PMI. No thrills. Abdomen:  Abdomen is soft, non-tender, non-distended No hernias, masses, or organomegaly Normoactive bowel sounds.  Musculoskeletal:  No cyanosis, clubbing, or edema Skin:  No rashes, lesions, ulcers palpation of skin: no induration or nodules Neurologic:  CN 2-12 intact Sensation all 4 extremities intact Psychiatric:  Mental status Mood, affect appropriate Orientation to person, place, time  judgment and insight appear intact  Scheduled Meds: . Chlorhexidine Gluconate Cloth  6 each Topical Daily  . cholecalciferol  1,000 Units Oral Daily  . cyclobenzaprine  5 mg Oral TID  . enoxaparin (LOVENOX) injection  30 mg Subcutaneous Q24H  . escitalopram  5 mg Oral Daily  . feeding supplement (ENSURE ENLIVE)  237 mL Oral TID  . fentaNYL  1 patch Transdermal Q72H  . glimepiride  2 mg Oral Q breakfast  . lactulose  10 g Oral BID  . levothyroxine  25 mcg Oral Q0600  . oxybutynin  5 mg Oral QID  . pantoprazole  40 mg Oral Daily  . Plecanatide  3 mg Oral Daily  . senna-docusate  1 tablet Oral BID  . simvastatin  40 mg Oral QHS   Continuous Infusions: .  sodium chloride 100 mL/hr at 07/23/19 2244    Principal Problem:   Sacral pain Active Problems:   Type 2 diabetes mellitus with stage 4 chronic kidney disease (HCC)   Hypothyroidism   GERD (gastroesophageal reflux disease)   Anemia in chronic kidney disease (CKD)   LOS: 6 days   A & P   Sacral pain: Acute on chronic sacral  pain consistent with recent history of fracture, trauma and surgery.  Inadequate pain control. I had added a 12.5 mcg patch on 07/19/2019. I have added a second patch yesterday. Seen by neurosurgery and orthopedics, no acute surgical intervention needed. Fentanyl patch and B&O suppositories have been added to the patient's pain regimen with scheduled bowel regimen.  Work with PT OT.  Will refer to skilled nursing rehab for inpatient therapies.  Urinary retention: Suspect overflow incontinence after urinary retention secondary to decreased mobility from sacral pain and pelvic pain.  Recent urine culture with E. coli 10,000 colonies.  Urinalysis is clear. B&O suppository does not seem to have helped with discomfort. Oxybutinin added. Leave foley in place for two weeks or until she can follow up with urology as outpatient.   AKI: Resolving. Creatinine has been slowly increasing. Today back down to 1.37 with reduction in potassium to 4.6. Lisinopril has been held. Today will reduce rate of IV fluids to 75 cc/hr.  Hyponatremia: Improved from 123 to 129 on IV fluids and discontinuation of lisinopril. Continue IV NS at a lower rate. Monitor carefully.  Type 2 diabetes with stage IV chronic kidney disease, controlled with hyperglycemia: Fairly stable.  On oral hypoglycemics at home.  Continue and also on sliding scale insulin. Glucoses have been 130- 227 in the last 24 hours.  Hypothyroidism: On Synthroid.  Constipation: High risk of constipation and recent hospitalization. Scheduled stool softener and laxative.  GERD: On PPI continue.  I have seen and examined this patient myself. I have spent 32 minutes in her evaluation and care.  DVT prophylaxis: enoxaparin (LOVENOX) injection 30 mg Start: 07/17/19 2200 Code Status: Full code Family Communication: Patient's daughter at bedside Disposition Plan: Status is: Inpatient. The patient is from home. Anticipate discharge to SNF. Barrier to discharge SNF  bed.   Remains inpatient appropriate because:Unsafe d/c plan and Inpatient level of care appropriate due to severity of illness.  Dispo: The patient is from: Home  Anticipated d/c is to: SNF  Anticipated d/c date is: 2 day  Patient currently is medically stable to d/c.  Awaiting SNF bed.  Saqib Cazarez, DO [facility] Direct contact: see [email or web address]  7PM-7AM contact night coverage as above 07/23/2019, 11:21 PM  LOS: 2 days

## 2019-07-24 DIAGNOSIS — S3210XD Unspecified fracture of sacrum, subsequent encounter for fracture with routine healing: Secondary | ICD-10-CM | POA: Diagnosis not present

## 2019-07-24 DIAGNOSIS — Z79899 Other long term (current) drug therapy: Secondary | ICD-10-CM | POA: Diagnosis not present

## 2019-07-24 DIAGNOSIS — R319 Hematuria, unspecified: Secondary | ICD-10-CM | POA: Diagnosis not present

## 2019-07-24 DIAGNOSIS — D649 Anemia, unspecified: Secondary | ICD-10-CM | POA: Diagnosis not present

## 2019-07-24 DIAGNOSIS — R0902 Hypoxemia: Secondary | ICD-10-CM | POA: Diagnosis not present

## 2019-07-24 DIAGNOSIS — Z7401 Bed confinement status: Secondary | ICD-10-CM | POA: Diagnosis not present

## 2019-07-24 DIAGNOSIS — I10 Essential (primary) hypertension: Secondary | ICD-10-CM | POA: Diagnosis not present

## 2019-07-24 DIAGNOSIS — E119 Type 2 diabetes mellitus without complications: Secondary | ICD-10-CM | POA: Diagnosis not present

## 2019-07-24 DIAGNOSIS — K219 Gastro-esophageal reflux disease without esophagitis: Secondary | ICD-10-CM | POA: Diagnosis not present

## 2019-07-24 DIAGNOSIS — K5900 Constipation, unspecified: Secondary | ICD-10-CM | POA: Diagnosis not present

## 2019-07-24 DIAGNOSIS — E871 Hypo-osmolality and hyponatremia: Secondary | ICD-10-CM | POA: Diagnosis not present

## 2019-07-24 DIAGNOSIS — R6889 Other general symptoms and signs: Secondary | ICD-10-CM | POA: Diagnosis not present

## 2019-07-24 DIAGNOSIS — M2550 Pain in unspecified joint: Secondary | ICD-10-CM | POA: Diagnosis not present

## 2019-07-24 DIAGNOSIS — E039 Hypothyroidism, unspecified: Secondary | ICD-10-CM | POA: Diagnosis not present

## 2019-07-24 DIAGNOSIS — M533 Sacrococcygeal disorders, not elsewhere classified: Secondary | ICD-10-CM | POA: Diagnosis not present

## 2019-07-24 DIAGNOSIS — R339 Retention of urine, unspecified: Secondary | ICD-10-CM | POA: Diagnosis not present

## 2019-07-24 DIAGNOSIS — N390 Urinary tract infection, site not specified: Secondary | ICD-10-CM | POA: Diagnosis not present

## 2019-07-24 DIAGNOSIS — Z9181 History of falling: Secondary | ICD-10-CM | POA: Diagnosis not present

## 2019-07-24 LAB — BASIC METABOLIC PANEL
Anion gap: 9 (ref 5–15)
BUN: 30 mg/dL — ABNORMAL HIGH (ref 8–23)
CO2: 25 mmol/L (ref 22–32)
Calcium: 9.1 mg/dL (ref 8.9–10.3)
Chloride: 96 mmol/L — ABNORMAL LOW (ref 98–111)
Creatinine, Ser: 1.07 mg/dL — ABNORMAL HIGH (ref 0.44–1.00)
GFR calc Af Amer: 54 mL/min — ABNORMAL LOW (ref >60)
GFR calc non Af Amer: 47 mL/min — ABNORMAL LOW (ref >60)
Glucose, Bld: 103 mg/dL — ABNORMAL HIGH (ref 70–99)
Potassium: 4.3 mmol/L (ref 3.5–5.1)
Sodium: 130 mmol/L — ABNORMAL LOW (ref 135–145)

## 2019-07-24 LAB — GLUCOSE, CAPILLARY
Glucose-Capillary: 91 mg/dL (ref 70–99)
Glucose-Capillary: 170 mg/dL — ABNORMAL HIGH (ref 70–99)
Glucose-Capillary: 73 mg/dL (ref 70–99)

## 2019-07-24 MED ORDER — BISACODYL 10 MG RE SUPP
10.0000 mg | Freq: Once | RECTAL | Status: AC
  Administered 2019-07-24: 10 mg via RECTAL
  Filled 2019-07-24: qty 1

## 2019-07-24 MED ORDER — POLYETHYLENE GLYCOL 3350 17 G PO PACK: 17.0000 g | PACK | Freq: Every day | ORAL | 0 refills | Status: AC | PRN

## 2019-07-24 MED ORDER — SENNOSIDES-DOCUSATE SODIUM 8.6-50 MG PO TABS: 1.0000 | ORAL_TABLET | Freq: Two times a day (BID) | ORAL | 0 refills | Status: DC

## 2019-07-24 MED ORDER — GLIMEPIRIDE 2 MG PO TABS: 2.0000 mg | ORAL_TABLET | Freq: Every day | ORAL | 0 refills | Status: DC

## 2019-07-24 MED ORDER — OXYCODONE HCL 5 MG PO TABS: 5.0000 mg | ORAL_TABLET | ORAL | 0 refills | Status: DC | PRN

## 2019-07-24 MED ORDER — OXYBUTYNIN CHLORIDE 5 MG PO TABS: 5.0000 mg | ORAL_TABLET | Freq: Four times a day (QID) | ORAL | 0 refills | Status: DC

## 2019-07-24 MED ORDER — FENTANYL 50 MCG/HR TD PT72: 1.0000 | MEDICATED_PATCH | TRANSDERMAL | 0 refills | Status: DC

## 2019-07-24 NOTE — Progress Notes (Signed)
Pt discharged to [facility] via EMS. Pt stable.

## 2019-07-24 NOTE — TOC Transition Note (Signed)
Transition of Care [facility]) - CM/SW Discharge Note   Patient Details  Name: DELOISE MARCHANT MRN: [id number] Date of Birth: 1929-02-14  Transition of Care [facility]) CM/SW Contact:  Candie Chroman, LCSW Phone Number: 07/24/2019, 3:02 PM   Clinical Narrative: Patient has orders to discharge to [facility] today. RN has already called report. EMS transport has been set up. Patient is fourth on the list. No further concerns. CSW signing off.    Final next level of care: Skilled Nursing Facility Barriers to Discharge: Barriers Resolved   Patient Goals and CMS Choice Patient states their goals for this hospitalization and ongoing recovery are:: "If I could just get this pain under control I could go home." CMS Medicare.gov Compare Post Acute Care list provided to:: Patient (Daughter at bedside.) Choice offered to / list presented to : Patient  Discharge Placement PASRR number recieved: 07/19/19            Patient chooses bed at: [facility] [place] Patient to be transferred to facility by: EMS Name of family member notified: Patient will call her family. Patient and family notified of of transfer: 07/24/19  Discharge Plan and Services     Post Acute Care Choice: [place]                               Social Determinants of Health (SDOH) Interventions     Readmission Risk Interventions No flowsheet data found.

## 2019-07-24 NOTE — Discharge Summary (Addendum)
Physician Discharge Summary  RIATA IKEDA FAO:[id number] DOB: 1929/12/04 DOA: 07/17/2019  PCP: Olin Hauser, DO  Admit date: 07/17/2019 Discharge date: 07/24/2019  Recommendations for Outpatient Follow-up:  1. Discharge to SNF with PT/OT 2. Draw chemistry on 07/27/2019 and report results to facility physician. 3. Follow up with [facility] Vaillancourt in 2 weeks. 4. Follow up with Dr. Rudene Christians in 3 weeks.   Contact information for after-discharge care    Destination    [facility] .   Service: Skilled Nursing Contact information: [address] [place] [phone]                 Discharge Diagnoses: Principal diagnosis is #1 1. Left superior and inferior pubic rami fracture 2. Sacral pain 3. Urinary retension 4. AKI on CKD IV 5. Hyponatremia 6. Hyperkalemia 7. DM II 8. Hypothyroidism 9. Constipation 10. GERD  Discharge Condition: Fair  Disposition: SNF  Diet recommendation: Heart healthy with modified carbohydrates  Filed Weights   07/17/19 0641  Weight: 59 kg    History of present illness:   Lisa Mills is a 86 y.o. female  with a known history of hypertension and GERD as well as a recent mechanical fall with transverse S1 fracture and bilateral sacral ala fracture for which she underwent sacral plasty by Dr. Kyla Balzarine and was discharged home.  The patient was readmitted 06/21 - 06/24 for severe constipation with no bowel movements for at least 7 days as well as persistent pain in the sacral area. Severe constipation was thought to be secondary to narcotic use and patient was discharged home with home PT and OT. She presents again today with a family member with complaints of persistent sacral pain, fecal urinary incontinence as well as numbness in the pelvic area.  During her hospitalization 2 weeks ago she was found to have urinary retention and required a Foley catheter.  This was discontinued prior  to her discharge.  Patient states she can only void standing up and most of the time is incontinent of urine and feces with family members cleaning up after her.  She also complains of weakness and has difficulty with ambulation due to severe pain in her lower back. She denies having any recent trauma, no falls, no dizziness or lightheadedness. MRI of the pelvis shows no acute abnormality. Status post bilateral sacroplasty. Left superior and inferior pubic ramus fractures as seen on prior CT. Small hematomas in the left operator internus obturator externus have slightly decreased in size since the prior exam. Fluid at the hamstring origins bilaterally compatible with strain or tendinosis, more notable on the left. MRI of the lumbar spine showed Bilateral sacral insufficiency fractures with surrounding marrow edema. Diffuse lumbar spine spondylosis as described above. At L4-5 there is a broad-based disc bulge. Severe bilateral facet arthropathy. Mild spinal stenosis. Bilateral lateral recess stenosis. Moderate bilateral foraminal stenosis. At L2-3 there is a broad-based disc bulge eccentric towards the left. Mild bilateral facet arthropathy. Moderate left foraminal stenosis. Mild right foraminal stenosis. Left subarticular recess stenosis. Mild spinal stenosis.  At L5-S1 there is a broad-based disc bulge with a small right paracentral disc protrusion. Mild bilateral foraminal stenosis.  ED Course: Patient is an 86 year old Caucasian female who presents to the emergency room with complaints of lower extremity weakness, urinary and fecal incontinence as well as worsening pain in the sacral area. Patient is status post recent sacral plasty for bilateral sacral alar fratures. She will be admitted  to the hospital for further evaluation.  Hospital Course:  Orthopedic surgery was consulted regarding the patient's sacral pain. He did not feel that an epidural steroid injection could be done while the patient  was inpatient.   She was also evaluated by neurosurgery who determined that there was no evidence for cauda equina syndrome. She was found to be neurologically intact.   The patient was admitted to a telemetry bed. We have achieved fair pain control with fentanyl patch 50 mcg and prn oxycodone. She continues to have the foley catheter as per urology recommendations. Oxybutinin has helped with her discomfort from the catheter. During her stay the patient developed AKI and hyponatremia and hyperkalemia. This was corrected with IV NS. The patient is also constipated. Today she is appropriate for discharge to SNF once she has a BM.  Today's assessment: S: The patient satates that she is feeling better. No new complaints. O: Vitals:  Vitals:   07/24/19 0437 07/24/19 1204  BP: (!) 145/73 (!) 156/71  Pulse: 96 94  Resp: 18 14  Temp: (!) 97.5 F (36.4 C) 98.8 F (37.1 C)  SpO2: 96% 95%    Exam:    Constitutional:   The patient is awake, alert, and oriented x 3. Mild distress from pain from foley catheter.  Respiratory:   No increased work of breathing.  No wheezes, rales, or rhonchi  No tactile fremitus  Cardiovascular:   Regular rate and rhythm  No murmurs, ectopy, or gallups.  No lateral PMI. No thrills.  Abdomen:   Abdomen is soft, non-tender, non-distended  No hernias, masses, or organomegaly  Normoactive bowel sounds.   Musculoskeletal:   No cyanosis, clubbing, or edema  Skin:   No rashes, lesions, ulcers  palpation of skin: no induration or nodules  Neurologic:   CN 2-12 intact  Sensation all 4 extremities intact  Psychiatric:   Mental status  Mood, affect appropriate  Orientation to person, place, time   judgment and insight appear intact  Discharge Instructions  Discharge Instructions    Activity as tolerated - No restrictions   Complete by: As directed    Call MD for:  severe uncontrolled pain   Complete by: As directed    Diet  - low sodium heart healthy   Complete by: As directed    Discharge instructions   Complete by: As directed    Discharge to SNF with PT/OT Draw chemistry on 07/27/2019 and report results to facility physician. Follow up with [facility] Vaillancourt in 2 weeks. Follow up with Dr. Rudene Christians in 3 weeks.   Increase activity slowly   Complete by: As directed      Allergies as of 07/24/2019      Reactions   Aspirin Other (See Comments)   Burns stomach   Conray [iothalamate] Hives   IV dye conray-400   Dye Fdc Red [red Dye] Hives   Sulfa Antibiotics    Unknown Reaction, not used in years   Prednisone    Indigestion       Medication List    STOP taking these medications   lisinopril 40 MG tablet Commonly known as: ZESTRIL   traMADol 50 MG tablet Commonly known as: ULTRAM     TAKE these medications   cholecalciferol 1000 units tablet Commonly known as: VITAMIN D Take 2,000 Units by mouth daily.   cyclobenzaprine 5 MG tablet Commonly known as: FLEXERIL Take 1 tablet (5 mg total) by mouth 3 (three) times daily.   escitalopram 5 MG  tablet Commonly known as: LEXAPRO TAKE 1 TABLET BY MOUTH ONCE DAILY WITH FOOD What changed: See the new instructions.   feeding supplement (ENSURE ENLIVE) Liqd Take 237 mLs by mouth 3 (three) times daily.   fentaNYL 50 MCG/HR Commonly known as: DURAGESIC Place 1 patch onto the skin every 3 (three) days for 15 days.   ferrous sulfate 325 (65 FE) MG tablet Take 325 mg by mouth daily.   glimepiride 2 MG tablet Commonly known as: AMARYL Take 1 tablet (2 mg total) by mouth daily. With meal, either breakfast or dinner. What changed:   medication strength  how much to take   glucose blood test strip Commonly known as: ONE TOUCH ULTRA TEST CHECK BLOOD SUGAR UP TO 2 TIMES A DAY.   levothyroxine 25 MCG tablet Commonly known as: SYNTHROID TAKE 1 TABLET BY MOUTH ONCE DAILY ON AN EMPTY STOMACH. WAIT 30 MINUTES BEFORE TAKING OTHER MEDS. What changed: See the  new instructions.   Melatonin 10 MG Subl Place 10 mg under the tongue at bedtime as needed (sleep).   omeprazole 20 MG capsule Commonly known as: PRILOSEC TAKE 1 CAPSULE BY MOUTH ONCE DAILY   ONE TOUCH BASIC SYSTEM w/Device Kit Use glucometer to check blood sugar daily.   OneTouch Delica Lancets 33G Misc 1 each by Does not apply route 3 (three) times daily. Dx:E11.9   oxybutynin 5 MG tablet Commonly known as: DITROPAN Take 1 tablet (5 mg total) by mouth 4 (four) times daily.   oxyCODONE 5 MG immediate release tablet Commonly known as: Oxy IR/ROXICODONE Take 1 tablet (5 mg total) by mouth every 4 (four) hours as needed for moderate pain, severe pain or breakthrough pain. What changed: reasons to take this   polyethylene glycol 17 g packet Commonly known as: MIRALAX / GLYCOLAX Take 17 g by mouth daily as needed for moderate constipation or severe constipation.   senna 8.6 MG Tabs tablet Commonly known as: SENOKOT Take 1 tablet (8.6 mg total) by mouth 2 (two) times daily. What changed:   when to take this  reasons to take this   senna-docusate 8.6-50 MG tablet Commonly known as: Senokot-S Take 1 tablet by mouth 2 (two) times daily.   simvastatin 40 MG tablet Commonly known as: ZOCOR TAKE 1 TABLET BY MOUTH AT BEDTIME   Trulance 3 MG Tabs Generic drug: Plecanatide Take 3 mg by mouth daily.       Allergies  Allergen Reactions  . Aspirin Other (See Comments)    Burns stomach  . Conray [Iothalamate] Hives    IV dye conray-400  . Dye Fdc Red [Red Dye] Hives  . Sulfa Antibiotics     Unknown Reaction, not used in years  . Prednisone     Indigestion     The results of significant diagnostics from this hospitalization (including imaging, microbiology, ancillary and laboratory) are listed below for reference.    Significant Diagnostic Studies: DG Sacrum/Coccyx  Result Date: 07/06/2019 CLINICAL DATA:  Sacroplasty EXAM: DG C-ARM 1-60 MIN; SACRUM AND COCCYX - 2+  VIEW COMPARISON:  07/04/2019 FINDINGS: Multiple C-arm images show bilateral sacroplasty. Bilateral methylmethacrylate without identifiable complicating feature. IMPRESSION: Bilateral sacroplasty. Electronically Signed   By: Paulina Fusi M.D.   On: 07/06/2019 19:00   DG Abdomen 1 View  Result Date: 07/09/2019 CLINICAL DATA:  Constipation. EXAM: ABDOMEN - 1 VIEW COMPARISON:  Radiograph dated 03/16/2016 and CT scan of the pelvis dated 07/04/2019 FINDINGS: There are no dilated loops of large or small bowel.  There is moderate stool in the hepatic flexure and in the splenic flexure and descending colon but there is no fecal impaction in the rectum. There is a displaced fracture of the left pubic body and left superior pubic ramus as demonstrated on the prior CT scan of 07/04/2019. The patient has undergone bilateral sacroplasty for the bilateral sacral ala fractures demonstrated on that CT scan. No acute bone abnormality. IMPRESSION: 1. Benign-appearing abdomen. Moderate stool in the hepatic flexure and descending colon. No fecal impaction in the rectum. 2. Displaced fractures of the left pubic body and left superior pubic ramus as demonstrated on the prior CT scan. Electronically Signed   By: Lorriane Shire M.D.   On: 07/09/2019 17:42   CT Lumbar Spine Wo Contrast  Result Date: 07/04/2019 CLINICAL DATA:  Fall 1 month ago.  Pain in tailbone.  Low back pain. EXAM: CT LUMBAR SPINE WITHOUT CONTRAST TECHNIQUE: Multidetector CT imaging of the lumbar spine was performed without intravenous contrast administration. Multiplanar CT image reconstructions were also generated. COMPARISON:  None. FINDINGS: Segmentation: 5 non rib-bearing lumbar type vertebral bodies are present. The lowest fully formed vertebral body is L5. Alignment: Grade 1 anterolisthesis at L4-5 measures 5 mm. Slight retrolisthesis present at L2-3 and L3-4. Vertebrae: Vertebral body heights are maintained. No acute vertebral fracture is present. Bilateral  sacral ala fractures are present. Fracture is somewhat more prominent on the right than on the left. Transverse S1 fracture is present. Paraspinal and other soft tissues: Atherosclerotic calcifications are present in the aorta without aneurysm. Benign appearing cyst is noted anteriorly in the right kidney. No other solid organ lesions are present. Disc levels: L1-2: Leftward disc bulging is present. No significant stenosis is present. L2-3: A broad-based disc protrusion is present. Moderate facet hypertrophy is present. Mild central canal narrowing is present. Moderate foraminal stenosis is present bilaterally. L3-4: A broad-based disc protrusion is present. Advanced facet hypertrophy is noted bilaterally. No significant stenosis is present. L4-5: A broad-based disc protrusion is present. Advanced facet hypertrophy and spurring is noted bilaterally. Mild central canal narrowing is worse on the left. Moderate foraminal stenosis is worse on left. L5-S1: Moderate facet hypertrophy is noted bilaterally. Mild disc bulging is present. Moderate foraminal narrowing present bilaterally. The central canal is patent. IMPRESSION: 1. Bilateral sacral ala fractures and transverse S1 fracture appear acute and likely are the source of the patient's pain. 2. Multilevel spondylosis of the lumbar spine as described. 3. Mild central canal narrowing and moderate foraminal stenosis bilaterally at L2-3 and L4-5. 4. Moderate foraminal stenosis bilaterally at L5-S1. 5. Aortic Atherosclerosis (ICD10-I70.0). Electronically Signed   By: San Morelle M.D.   On: 07/04/2019 11:32   CT PELVIS WO CONTRAST  Result Date: 07/04/2019 CLINICAL DATA:  Pelvic pain.  Fall approximately 1 month ago EXAM: CT PELVIS WITHOUT CONTRAST TECHNIQUE: Multidetector CT imaging of the pelvis was performed following the standard protocol without intravenous contrast. COMPARISON:  X-ray 06/21/2019 FINDINGS: Bones/Joint/Cartilage Subacute displaced fracture of  the parasymphyseal aspect of the left pubic bone with involvement of the superior pubic ramus with moderate angulation and early callus formation (series 5, image 40). Moderately displaced fracture of the mid left inferior pubic ramus with early callus formation (series 3, image 95). These fractures appear unchanged in alignment compared to radiographic study of 06/21/2019. Pubic symphysis is intact with moderate arthropathy including chondrocalcinosis. Nondisplaced mildly comminuted fracture involving the peripheral aspect of the right sacroiliac joint (series 5, image 60) with extension into the right S1 neural  foramen (series 3, image 33). Possible extension to the right S2 foramen (series 3, image 35). Fracture extends to the superior aspect of the right SI joint. No SI joint diastasis. There is mild sclerosis within the right hemi sacrum suggesting subacute chronicity. Nondisplaced fracture of the left sacral ala with involvement of the left SI joint (series 5, images 61-68). No SI joint diastasis. No evidence of fracture extension into the sacral foramina. Remaining included osseous structures appear intact. There is moderate right and mild-to-moderate left hip osteoarthritis. No hip fracture or dislocation. No lytic or sclerotic bone lesion is seen. Ligaments Suboptimally assessed by CT. Muscles and Tendons Rounded fluid collection within the left obturator internus muscle measuring up to 2.4 cm (series 7, image 66; series 4, image 81) adjacent to left inferior pubic ramus fracture. No acute tendinous abnormality within the limitations of CT. Soft tissues Remaining soft tissues appear within normal limits. IMPRESSION: 1. Subacute appearing nondisplaced bilateral sacral alar fractures, as described above. 2. Subacute fractures of the left superior and inferior pubic rami. These fractures appear unchanged in alignment compared to radiographic study of 06/21/2019. 3. Rounded fluid collection within the left  obturator internus muscle measuring up to 2.4 cm adjacent to the left inferior pubic ramus fracture, likely reflecting an involving hematoma. Electronically Signed   By: Davina Poke D.O.   On: 07/04/2019 11:31   MR LUMBAR SPINE WO CONTRAST  Result Date: 07/17/2019 CLINICAL DATA:  Low back pain, cauda equina syndrome. Numbness in the perineum. EXAM: MRI LUMBAR SPINE WITHOUT CONTRAST TECHNIQUE: Multiplanar, multisequence MR imaging of the lumbar spine was performed. No intravenous contrast was administered. COMPARISON:  CT lumbar spine 07/04/2019 FINDINGS: Segmentation:  Standard. Alignment: 3 mm retrolisthesis of L2 on L3 and L3 on L4. Minimal grade 1 anterolisthesis of L4 on L5. Vertebrae:  No fracture, evidence of discitis, or bone lesion. Other: Marrow edema in the sacral ala bilaterally with linear components consistent with bilateral sacral insufficiency fractures. Conus medullaris and cauda equina: Conus extends to the T12 L level. Conus and cauda equina appear normal. Paraspinal and other soft tissues: No acute paraspinal abnormality. Right renal cyst. Disc levels: Disc spaces: Degenerative disease with disc height loss at L2-3, L3-4 and L5-S1. T12-L1: No significant disc bulge. No evidence of neural foraminal stenosis. No central canal stenosis. L1-L2: No significant disc bulge. Mild left facet arthropathy. Mild left foraminal stenosis. No right foraminal stenosis. No central canal stenosis. L2-L3: Broad-based disc bulge eccentric towards the left. Mild bilateral facet arthropathy. Moderate left foraminal stenosis. Mild right foraminal stenosis. Left subarticular recess stenosis. Mild spinal stenosis. L3-L4: Mild broad-based disc bulge. Mild bilateral facet arthropathy. Moderate bilateral foraminal stenosis. No central canal stenosis. L4-L5: Broad-based disc bulge. Severe bilateral facet arthropathy. Mild spinal stenosis. Bilateral lateral recess stenosis. Moderate bilateral foraminal stenosis.  L5-S1: Broad-based disc bulge with a small right paracentral disc protrusion. Mild bilateral foraminal stenosis. No central canal stenosis. IMPRESSION: 1. Bilateral sacral insufficiency fractures with surrounding marrow edema. 2. Diffuse lumbar spine spondylosis as described above. 3. At L4-5 there is a broad-based disc bulge. Severe bilateral facet arthropathy. Mild spinal stenosis. Bilateral lateral recess stenosis. Moderate bilateral foraminal stenosis. 4. At L2-3 there is a broad-based disc bulge eccentric towards the left. Mild bilateral facet arthropathy. Moderate left foraminal stenosis. Mild right foraminal stenosis. Left subarticular recess stenosis. Mild spinal stenosis. 5. At L5-S1 there is a broad-based disc bulge with a small right paracentral disc protrusion. Mild bilateral foraminal stenosis. Electronically Signed  By: Kathreen Devoid   On: 07/17/2019 13:45   MR PELVIS WO CONTRAST  Result Date: 07/17/2019 CLINICAL DATA:  Worsening pelvic pain. History of a fall in May, 2021 resulting in sacral fractures and left pubic rami fractures. The patient underwent sacroplasty 07/06/2019. EXAM: MRI PELVIS WITHOUT CONTRAST TECHNIQUE: Multiplanar, multisequence MR imaging of the pelvis was performed. No intravenous contrast was administered. COMPARISON:  CT of the pelvis 07/04/2019. FINDINGS: Bones/Joint/Cartilage The patient has undergone bilateral sacroplasty since the prior examination. There is residual marrow edema in the sacrum due to the patient's fractures. Also again seen are left superior and inferior pubic ramus fractures with surrounding marrow edema. No acute fracture is identified. Lower lumbar degenerative change is noted. There is mild degenerative disease about the hips, more notable on the right. No joint effusion. No worrisome marrow lesion. Ligaments Intact. Muscles and Tendons There is some edema in the piriformis muscles bilaterally. A hematoma in the left obturator internus measures 2.9  cm AP x 2.1 cm transverse x 1.7 cm craniocaudal, slightly decreased since the prior exam. A smaller hematoma is seen in the left obturator externus. There is some fluid at the hamstring origins bilaterally compatible with strain or tendinosis. Soft tissues Imaged intrapelvic contents demonstrate no acute or focal abnormality. IMPRESSION: No acute abnormality. Status post bilateral sacroplasty. Left superior and inferior pubic ramus fractures as seen on prior CT. Small hematomas in the left operator internus obturator externus have slightly decreased in size since the prior exam. Fluid at the hamstring origins bilaterally compatible with strain or tendinosis, more notable on the left. Electronically Signed   By: Inge Rise M.D.   On: 07/17/2019 14:36   DG C-Arm 1-60 Min  Result Date: 07/06/2019 CLINICAL DATA:  Sacroplasty EXAM: DG C-ARM 1-60 MIN; SACRUM AND COCCYX - 2+ VIEW COMPARISON:  07/04/2019 FINDINGS: Multiple C-arm images show bilateral sacroplasty. Bilateral methylmethacrylate without identifiable complicating feature. IMPRESSION: Bilateral sacroplasty. Electronically Signed   By: Nelson Chimes M.D.   On: 07/06/2019 19:00    Microbiology: Recent Results (from the past 240 hour(s))  SARS CORONAVIRUS 2 (TAT 6-24 HRS) Nasopharyngeal Nasopharyngeal Swab     Status: None   Collection Time: 07/17/19  6:42 PM   Specimen: Nasopharyngeal Swab  Result Value Ref Range Status   SARS Coronavirus 2 NEGATIVE NEGATIVE Final    Comment: (NOTE) SARS-CoV-2 target nucleic acids are NOT DETECTED.  The SARS-CoV-2 RNA is generally detectable in upper and lower respiratory specimens during the acute phase of infection. Negative results do not preclude SARS-CoV-2 infection, do not rule out co-infections with other pathogens, and should not be used as the sole basis for treatment or other patient management decisions. Negative results must be combined with clinical observations, patient history, and  epidemiological information. The expected result is Negative.  Fact Sheet for Patients: SugarRoll.be  Fact Sheet for Healthcare Providers: https://www.woods-mathews.com/  This test is not yet approved or cleared by the [place] FDA and  has been authorized for detection and/or diagnosis of SARS-CoV-2 by FDA under an Emergency Use Authorization (EUA). This EUA will remain  in effect (meaning this test can be used) for the duration of the COVID-19 declaration under Se ction 564(b)(1) of the Act, 21 U.S.C. section 360bbb-3(b)(1), unless the authorization is terminated or revoked sooner.  Performed at [facility], [place] [address] [place], [place] [address]      Labs: Basic Metabolic Panel: Recent Labs  Lab 07/21/19 0408 07/22/19 0532 07/22/19 1841 07/23/19 1062  07/24/19 0608  NA 128* 124* 126* 129* 130*  K 4.7 5.6* 5.3* 4.6 4.3  CL 90* 86* 89* 92* 96*  CO2 '27 27 28 28 25  '$ GLUCOSE 151* 165* 149* 157* 103*  BUN 33* 54* 55* 47* 30*  CREATININE 1.48* 1.75* 1.58* 1.37* 1.07*  CALCIUM 9.1 9.0 8.8* 9.0 9.1   Liver Function Tests: No results for input(s): AST, ALT, ALKPHOS, BILITOT, PROT, ALBUMIN in the last 168 hours. No results for input(s): LIPASE, AMYLASE in the last 168 hours. No results for input(s): AMMONIA in the last 168 hours. CBC: Recent Labs  Lab 07/18/19 0435  WBC 8.2  HGB 9.6*  HCT 28.3*  MCV 91.9  PLT 474*   Cardiac Enzymes: No results for input(s): CKTOTAL, CKMB, CKMBINDEX, TROPONINI in the last 168 hours. BNP: BNP (last 3 results) No results for input(s): BNP in the last 8760 hours.  ProBNP (last 3 results) No results for input(s): PROBNP in the last 8760 hours.  CBG: Recent Labs  Lab 07/23/19 1149 07/23/19 1612 07/23/19 2120 07/24/19 0749 07/24/19 1201  GLUCAP 168* 108* 133* 91 170*    Principal Problem:   Sacral pain Active Problems:   Type 2 diabetes mellitus with stage 4 chronic  kidney disease (HCC)   Hypothyroidism   GERD (gastroesophageal reflux disease)   Anemia in chronic kidney disease (CKD)   Time coordinating discharge: 38 minutes.  Signed:        Jahari Wiginton, DO [facility]  07/24/2019, 1:06 PM

## 2019-07-24 NOTE — Care Management Important Message (Signed)
Important Message  Patient Details  Name: Lisa Mills MRN: [id number] Date of Birth: 1929/09/22   Medicare Important Message Given:  Yes     Dannette Barbara 07/24/2019, 11:12 AM

## 2019-07-25 DIAGNOSIS — E039 Hypothyroidism, unspecified: Secondary | ICD-10-CM | POA: Diagnosis not present

## 2019-07-25 DIAGNOSIS — D649 Anemia, unspecified: Secondary | ICD-10-CM | POA: Diagnosis not present

## 2019-07-25 DIAGNOSIS — M533 Sacrococcygeal disorders, not elsewhere classified: Secondary | ICD-10-CM | POA: Diagnosis not present

## 2019-07-25 DIAGNOSIS — K5900 Constipation, unspecified: Secondary | ICD-10-CM | POA: Diagnosis not present

## 2019-07-25 DIAGNOSIS — Z9181 History of falling: Secondary | ICD-10-CM | POA: Diagnosis not present

## 2019-07-25 DIAGNOSIS — S3210XD Unspecified fracture of sacrum, subsequent encounter for fracture with routine healing: Secondary | ICD-10-CM | POA: Diagnosis not present

## 2019-07-25 DIAGNOSIS — E119 Type 2 diabetes mellitus without complications: Secondary | ICD-10-CM | POA: Diagnosis not present

## 2019-07-25 DIAGNOSIS — K219 Gastro-esophageal reflux disease without esophagitis: Secondary | ICD-10-CM | POA: Diagnosis not present

## 2019-07-26 LAB — URINE CULTURE

## 2019-07-26 LAB — CBC AND DIFFERENTIAL
HCT: 28 — AB (ref 36–46)
Hemoglobin: 9.4 — AB (ref 12.0–16.0)
Platelets: 536 — AB (ref 150–399)
WBC: 11.7

## 2019-07-26 LAB — BASIC METABOLIC PANEL
BUN: 21 (ref 4–21)
CO2: 20 (ref 13–22)
Chloride: 89 — AB (ref 99–108)
Creatinine: 0.9 (ref 0.5–1.1)
Glucose: 75
Potassium: 3.8 (ref 3.4–5.3)
Sodium: 126 — AB (ref 137–147)

## 2019-07-26 LAB — HEPATIC FUNCTION PANEL
ALT: 18 (ref 7–35)
AST: 41 — AB (ref 13–35)

## 2019-07-26 LAB — COMPREHENSIVE METABOLIC PANEL
Albumin: 3.8 (ref 3.5–5.0)
Calcium: 9.3 (ref 8.7–10.7)

## 2019-07-26 LAB — OSMOLALITY: Osmolality: 254.9 — AB (ref 285–295)

## 2019-07-27 LAB — BASIC METABOLIC PANEL
BUN: 20 (ref 4–21)
CO2: 20 (ref 13–22)
Chloride: 89 — AB (ref 99–108)
Creatinine: 0.9 (ref 0.5–1.1)
Glucose: 89
Potassium: 4.4 (ref 3.4–5.3)
Sodium: 121 — AB (ref 137–147)

## 2019-07-27 LAB — COMPREHENSIVE METABOLIC PANEL
Albumin: 3.6 (ref 3.5–5.0)
Calcium: 9.2 (ref 8.7–10.7)

## 2019-07-28 LAB — BASIC METABOLIC PANEL
BUN: 21 (ref 4–21)
CO2: 21 (ref 13–22)
Chloride: 89 — AB (ref 99–108)
Creatinine: 0.9 (ref 0.5–1.1)
Glucose: 88
Potassium: 4.3 (ref 3.4–5.3)
Sodium: 124 — AB (ref 137–147)

## 2019-08-01 ENCOUNTER — Other Ambulatory Visit: Payer: Self-pay | Admitting: Family Medicine

## 2019-08-01 DIAGNOSIS — E871 Hypo-osmolality and hyponatremia: Secondary | ICD-10-CM | POA: Diagnosis not present

## 2019-08-01 DIAGNOSIS — R339 Retention of urine, unspecified: Secondary | ICD-10-CM | POA: Diagnosis not present

## 2019-08-01 DIAGNOSIS — E119 Type 2 diabetes mellitus without complications: Secondary | ICD-10-CM | POA: Diagnosis not present

## 2019-08-01 DIAGNOSIS — M533 Sacrococcygeal disorders, not elsewhere classified: Secondary | ICD-10-CM | POA: Diagnosis not present

## 2019-08-01 DIAGNOSIS — K5900 Constipation, unspecified: Secondary | ICD-10-CM | POA: Diagnosis not present

## 2019-08-01 DIAGNOSIS — E039 Hypothyroidism, unspecified: Secondary | ICD-10-CM | POA: Diagnosis not present

## 2019-08-01 DIAGNOSIS — D649 Anemia, unspecified: Secondary | ICD-10-CM | POA: Diagnosis not present

## 2019-08-01 DIAGNOSIS — K219 Gastro-esophageal reflux disease without esophagitis: Secondary | ICD-10-CM | POA: Diagnosis not present

## 2019-08-01 DIAGNOSIS — S3210XD Unspecified fracture of sacrum, subsequent encounter for fracture with routine healing: Secondary | ICD-10-CM | POA: Diagnosis not present

## 2019-08-01 DIAGNOSIS — Z9181 History of falling: Secondary | ICD-10-CM | POA: Diagnosis not present

## 2019-08-07 ENCOUNTER — Other Ambulatory Visit: Payer: Self-pay

## 2019-08-07 ENCOUNTER — Ambulatory Visit (INDEPENDENT_AMBULATORY_CARE_PROVIDER_SITE_OTHER): Payer: PPO | Admitting: Family Medicine

## 2019-08-07 ENCOUNTER — Encounter: Payer: Self-pay | Admitting: Family Medicine

## 2019-08-07 VITALS — BP 133/63 | HR 97 | Temp 97.5°F | Resp 16 | Ht 59.0 in | Wt 118.0 lb

## 2019-08-07 DIAGNOSIS — E871 Hypo-osmolality and hyponatremia: Secondary | ICD-10-CM

## 2019-08-07 DIAGNOSIS — N1831 Chronic kidney disease, stage 3a: Secondary | ICD-10-CM

## 2019-08-07 DIAGNOSIS — M533 Sacrococcygeal disorders, not elsewhere classified: Secondary | ICD-10-CM | POA: Diagnosis not present

## 2019-08-07 DIAGNOSIS — S3210XS Unspecified fracture of sacrum, sequela: Secondary | ICD-10-CM

## 2019-08-07 DIAGNOSIS — G2581 Restless legs syndrome: Secondary | ICD-10-CM | POA: Diagnosis not present

## 2019-08-07 DIAGNOSIS — D631 Anemia in chronic kidney disease: Secondary | ICD-10-CM | POA: Diagnosis not present

## 2019-08-07 DIAGNOSIS — E1122 Type 2 diabetes mellitus with diabetic chronic kidney disease: Secondary | ICD-10-CM

## 2019-08-07 DIAGNOSIS — F5104 Psychophysiologic insomnia: Secondary | ICD-10-CM

## 2019-08-07 DIAGNOSIS — F418 Other specified anxiety disorders: Secondary | ICD-10-CM | POA: Diagnosis not present

## 2019-08-07 DIAGNOSIS — N183 Chronic kidney disease, stage 3 unspecified: Secondary | ICD-10-CM

## 2019-08-07 MED ORDER — BACLOFEN 10 MG PO TABS: 5.0000 mg | ORAL_TABLET | Freq: Three times a day (TID) | ORAL | 2 refills | Status: DC | PRN

## 2019-08-07 MED ORDER — GABAPENTIN 250 MG/5ML PO SOLN: 100.0000 mg | Freq: Every day | ORAL | 0 refills | Status: DC

## 2019-08-07 NOTE — Progress Notes (Signed)
Subjective:    Patient ID: Lisa Mills, female    DOB: December 13, 1929, 86 y.o.   MRN: [id number]  Lisa Mills is a 86 y.o. female presenting on 08/07/2019 [facility] and sacral fracture  Accompanied by daughter Kyra Searles, who is in [place] but she is here now. Patient's son [name] lives nearby with patient locally not here today.  HPI   HFU Admit 6/28 Discharge to SNF 07/24/19 Discharged from [facility] to home on 08/01/19  Reviewed prior history, had fall injury, treated, ultimately inadequate improvement, hospitalized, orthopedic surgery with sacroplasty repair. On higher dose pain control, required higher dose opiates. - Fall injury caused problem with bladder, overactive bladder, drinking more water regularly, now she has scaled back on water intake. Waking up up to 4-5 times overnight to change depends. - Taking 4 sodium tablets daily, and now intake more sodium in diet - Admits muscle cramps worse overnight, and restless leg syndrome, will get up and difficulty sleeping. - Took some oxycodone 5mg  x 4 a day - she has 34 pills left, asking for help with pain - Admits difficulty with sleep for several factors - Admits anxiety and stress, family has caregiver stress as well. They are asking for Ativan or something to help with her nerves. She is already on Escitalopram 5 mg   Home Health PT OT RN, they will set this up soon. She is asking about when  Hyponatremia Ranging 124-126, was 122 on 7/13,then last checked Fri 7/16 reported to be 126.  - Asking When to repeat sodium lab - She has apt with Urologist on Mon 7/26 with new patient Urologist. She has had Urinary retention and had Foley catheter - Poor PO intake, weight lost about 12 lbs  Dry mouth, lost voice. On oxybutynin  Swollen hemorrhoids - may use Preparation H - OK to use  Anxiety / Stress Asking about Ativan, severe stress On Escitalopram already  - if stand up with walker more than 5-6 minutes, will  start shaking    Depression screen [facility] 2/9 03/15/2019 01/09/2019 11/01/2018  Decreased Interest 0 0 0  Down, Depressed, Hopeless 0 0 0  PHQ - 2 Score 0 0 0  Altered sleeping 0 3 -  Tired, decreased energy 0 0 -  Change in appetite 0 0 -  Feeling bad or failure about yourself  0 0 -  Trouble concentrating 0 0 -  Moving slowly or fidgety/restless 0 0 -  Suicidal thoughts 0 0 -  PHQ-9 Score 0 3 -  Difficult doing work/chores Not difficult at all Not difficult at all -  Some recent data might be hidden    Social History   Tobacco Use  . Smoking status: Never Smoker  . Smokeless tobacco: Never Used  Vaping Use  . Vaping Use: Never used  Substance Use Topics  . Alcohol use: Not Currently  . Drug use: No    Review of Systems Per HPI unless specifically indicated above     Objective:    BP 133/63   Pulse 97   Temp (!) 97.5 F (36.4 C) (Temporal)   Resp 16   Ht 4\' 11"  (1.499 m)   Wt 118 lb (53.5 kg)   SpO2 98%   BMI 23.83 kg/m   Wt Readings from Last 3 Encounters:  08/07/19 118 lb (53.5 kg)  07/17/19 130 lb (59 kg)  07/09/19 125 lb 10.6 oz (57 kg)    Physical Exam Vitals and nursing note  reviewed.  Constitutional:      General: She is not in acute distress.    Appearance: She is well-developed. She is not diaphoretic.     Comments: Chronically ill 85 year old elderly female, uncomfortable from sacral pain but  cooperative  HENT:     Head: Normocephalic and atraumatic.     Comments: Mildly hoarse voice  Eyes:     General:        Right eye: No discharge.        Left eye: No discharge.     Conjunctiva/sclera: Conjunctivae normal.  Neck:     Thyroid: No thyromegaly.  Cardiovascular:     Rate and Rhythm: Normal rate and regular rhythm.     Heart sounds: Normal heart sounds. No murmur heard.   Pulmonary:     Effort: Pulmonary effort is normal. No respiratory distress.     Breath sounds: Normal breath sounds. No wheezing or rales.  Musculoskeletal:      Cervical back: Normal range of motion and neck supple.     Comments: Sitting on pillows  Lymphadenopathy:     Cervical: No cervical adenopathy.  Skin:    General: Skin is warm and dry.     Findings: No erythema or rash.  Neurological:     Mental Status: She is alert and oriented to person, place, and time.  Psychiatric:        Behavior: Behavior normal.     Comments: Well groomed, good eye contact, normal speech and thoughts      Results for orders placed or performed during the hospital encounter of 07/17/19  SARS CORONAVIRUS 2 (TAT 6-24 HRS) Nasopharyngeal Nasopharyngeal Swab   Specimen: Nasopharyngeal Swab  Result Value Ref Range   SARS Coronavirus 2 NEGATIVE NEGATIVE  CBC  Result Value Ref Range   WBC 8.7 4.0 - 10.5 K/uL   RBC 3.37 (L) 3.87 - 5.11 MIL/uL   Hemoglobin 10.4 (L) 12.0 - 15.0 g/dL   HCT 30.0 (L) 36 - 46 %   MCV 89.0 80.0 - 100.0 fL   MCH 30.9 26.0 - 34.0 pg   MCHC 34.7 30.0 - 36.0 g/dL   RDW 12.9 11.5 - 15.5 %   Platelets 401 (H) 150 - 400 K/uL   nRBC 0.0 0.0 - 0.2 %  Comprehensive metabolic panel  Result Value Ref Range   Sodium 127 (L) 135 - 145 mmol/L   Potassium 4.4 3.5 - 5.1 mmol/L   Chloride 90 (L) 98 - 111 mmol/L   CO2 24 22 - 32 mmol/L   Glucose, Bld 130 (H) 70 - 99 mg/dL   BUN 19 8 - 23 mg/dL   Creatinine, Ser 1.21 (H) 0.44 - 1.00 mg/dL   Calcium 9.5 8.9 - 10.3 mg/dL   Total Protein 6.8 6.5 - 8.1 g/dL   Albumin 3.9 3.5 - 5.0 g/dL   AST 22 15 - 41 U/L   ALT 15 0 - 44 U/L   Alkaline Phosphatase 106 38 - 126 U/L   Total Bilirubin 0.7 0.3 - 1.2 mg/dL   GFR calc non Af Amer 40 (L) >60 mL/min   GFR calc Af Amer 47 (L) >60 mL/min   Anion gap 13 5 - 15  Urinalysis, Complete w Microscopic  Result Value Ref Range   Color, Urine STRAW (A) YELLOW   APPearance CLEAR (A) CLEAR   Specific Gravity, Urine 1.005 1.005 - 1.030   pH 8.0 5.0 - 8.0   Glucose, UA NEGATIVE NEGATIVE mg/dL  Hgb urine dipstick NEGATIVE NEGATIVE   Bilirubin Urine NEGATIVE  NEGATIVE   Ketones, ur 5 (A) NEGATIVE mg/dL   Protein, ur NEGATIVE NEGATIVE mg/dL   Nitrite NEGATIVE NEGATIVE   Leukocytes,Ua NEGATIVE NEGATIVE   RBC / HPF 0-5 0 - 5 RBC/hpf   WBC, UA NONE SEEN 0 - 5 WBC/hpf   Bacteria, UA NONE SEEN NONE SEEN   Squamous Epithelial / LPF 0-5 0 - 5  Sedimentation rate  Result Value Ref Range   Sed Rate 14 0 - 30 mm/hr  CBC with Differential/Platelet  Result Value Ref Range   WBC 8.8 4.0 - 10.5 K/uL   RBC 3.38 (L) 3.87 - 5.11 MIL/uL   Hemoglobin 10.5 (L) 12.0 - 15.0 g/dL   HCT 31.0 (L) 36 - 46 %   MCV 91.7 80.0 - 100.0 fL   MCH 31.1 26.0 - 34.0 pg   MCHC 33.9 30.0 - 36.0 g/dL   RDW 13.2 11.5 - 15.5 %   Platelets 535 (H) 150 - 400 K/uL   nRBC 0.0 0.0 - 0.2 %   Neutrophils Relative % 69 %   Neutro Abs 6.1 1.7 - 7.7 K/uL   Lymphocytes Relative 21 %   Lymphs Abs 1.9 0.7 - 4.0 K/uL   Monocytes Relative 8 %   Monocytes Absolute 0.7 0 - 1 K/uL   Eosinophils Relative 1 %   Eosinophils Absolute 0.1 0 - 0 K/uL   Basophils Relative 1 %   Basophils Absolute 0.0 0 - 0 K/uL   Immature Granulocytes 0 %   Abs Immature Granulocytes 0.03 0.00 - 0.07 K/uL  Glucose, capillary  Result Value Ref Range   Glucose-Capillary 128 (H) 70 - 99 mg/dL   Comment 1 Notify RN   CBC  Result Value Ref Range   WBC 8.2 4.0 - 10.5 K/uL   RBC 3.08 (L) 3.87 - 5.11 MIL/uL   Hemoglobin 9.6 (L) 12.0 - 15.0 g/dL   HCT 28.3 (L) 36 - 46 %   MCV 91.9 80.0 - 100.0 fL   MCH 31.2 26.0 - 34.0 pg   MCHC 33.9 30.0 - 36.0 g/dL   RDW 13.2 11.5 - 15.5 %   Platelets 474 (H) 150 - 400 K/uL   nRBC 0.0 0.0 - 0.2 %  Basic metabolic panel  Result Value Ref Range   Sodium 129 (L) 135 - 145 mmol/L   Potassium 4.5 3.5 - 5.1 mmol/L   Chloride 94 (L) 98 - 111 mmol/L   CO2 25 22 - 32 mmol/L   Glucose, Bld 117 (H) 70 - 99 mg/dL   BUN 22 8 - 23 mg/dL   Creatinine, Ser 1.24 (H) 0.44 - 1.00 mg/dL   Calcium 9.0 8.9 - 10.3 mg/dL   GFR calc non Af Amer 39 (L) >60 mL/min   GFR calc Af Amer 45 (L)  >60 mL/min   Anion gap 10 5 - 15  Glucose, capillary  Result Value Ref Range   Glucose-Capillary 105 (H) 70 - 99 mg/dL  Glucose, capillary  Result Value Ref Range   Glucose-Capillary 148 (H) 70 - 99 mg/dL  Glucose, capillary  Result Value Ref Range   Glucose-Capillary 126 (H) 70 - 99 mg/dL  Glucose, capillary  Result Value Ref Range   Glucose-Capillary 237 (H) 70 - 99 mg/dL  Glucose, capillary  Result Value Ref Range   Glucose-Capillary 123 (H) 70 - 99 mg/dL  Glucose, capillary  Result Value Ref Range   Glucose-Capillary 139 (H) 70 -  99 mg/dL  Basic metabolic panel  Result Value Ref Range   Sodium 123 (L) 135 - 145 mmol/L   Potassium 4.6 3.5 - 5.1 mmol/L   Chloride 89 (L) 98 - 111 mmol/L   CO2 23 22 - 32 mmol/L   Glucose, Bld 234 (H) 70 - 99 mg/dL   BUN 21 8 - 23 mg/dL   Creatinine, Ser 1.30 (H) 0.44 - 1.00 mg/dL   Calcium 8.8 (L) 8.9 - 10.3 mg/dL   GFR calc non Af Amer 37 (L) >60 mL/min   GFR calc Af Amer 43 (L) >60 mL/min   Anion gap 11 5 - 15  Glucose, capillary  Result Value Ref Range   Glucose-Capillary 131 (H) 70 - 99 mg/dL  Glucose, capillary  Result Value Ref Range   Glucose-Capillary 188 (H) 70 - 99 mg/dL  Glucose, capillary  Result Value Ref Range   Glucose-Capillary 115 (H) 70 - 99 mg/dL  Glucose, capillary  Result Value Ref Range   Glucose-Capillary 141 (H) 70 - 99 mg/dL  Glucose, capillary  Result Value Ref Range   Glucose-Capillary 110 (H) 70 - 99 mg/dL  Basic metabolic panel  Result Value Ref Range   Sodium 127 (L) 135 - 145 mmol/L   Potassium 4.6 3.5 - 5.1 mmol/L   Chloride 91 (L) 98 - 111 mmol/L   CO2 27 22 - 32 mmol/L   Glucose, Bld 126 (H) 70 - 99 mg/dL   BUN 26 (H) 8 - 23 mg/dL   Creatinine, Ser 1.26 (H) 0.44 - 1.00 mg/dL   Calcium 9.0 8.9 - 10.3 mg/dL   GFR calc non Af Amer 38 (L) >60 mL/min   GFR calc Af Amer 44 (L) >60 mL/min   Anion gap 9 5 - 15  Glucose, capillary  Result Value Ref Range   Glucose-Capillary 227 (H) 70 - 99 mg/dL    Glucose, capillary  Result Value Ref Range   Glucose-Capillary 232 (H) 70 - 99 mg/dL  Basic metabolic panel  Result Value Ref Range   Sodium 128 (L) 135 - 145 mmol/L   Potassium 4.7 3.5 - 5.1 mmol/L   Chloride 90 (L) 98 - 111 mmol/L   CO2 27 22 - 32 mmol/L   Glucose, Bld 151 (H) 70 - 99 mg/dL   BUN 33 (H) 8 - 23 mg/dL   Creatinine, Ser 1.48 (H) 0.44 - 1.00 mg/dL   Calcium 9.1 8.9 - 10.3 mg/dL   GFR calc non Af Amer 32 (L) >60 mL/min   GFR calc Af Amer 37 (L) >60 mL/min   Anion gap 11 5 - 15  Glucose, capillary  Result Value Ref Range   Glucose-Capillary 129 (H) 70 - 99 mg/dL  Glucose, capillary  Result Value Ref Range   Glucose-Capillary 123 (H) 70 - 99 mg/dL  Glucose, capillary  Result Value Ref Range   Glucose-Capillary 131 (H) 70 - 99 mg/dL  Glucose, capillary  Result Value Ref Range   Glucose-Capillary 155 (H) 70 - 99 mg/dL  Basic metabolic panel  Result Value Ref Range   Sodium 124 (L) 135 - 145 mmol/L   Potassium 5.6 (H) 3.5 - 5.1 mmol/L   Chloride 86 (L) 98 - 111 mmol/L   CO2 27 22 - 32 mmol/L   Glucose, Bld 165 (H) 70 - 99 mg/dL   BUN 54 (H) 8 - 23 mg/dL   Creatinine, Ser 1.75 (H) 0.44 - 1.00 mg/dL   Calcium 9.0 8.9 - 10.3 mg/dL  GFR calc non Af Amer 26 (L) >60 mL/min   GFR calc Af Amer 30 (L) >60 mL/min   Anion gap 11 5 - 15  Glucose, capillary  Result Value Ref Range   Glucose-Capillary 172 (H) 70 - 99 mg/dL  Glucose, capillary  Result Value Ref Range   Glucose-Capillary 144 (H) 70 - 99 mg/dL  Glucose, capillary  Result Value Ref Range   Glucose-Capillary 227 (H) 70 - 99 mg/dL  Glucose, capillary  Result Value Ref Range   Glucose-Capillary 130 (H) 70 - 99 mg/dL  Basic metabolic panel  Result Value Ref Range   Sodium 126 (L) 135 - 145 mmol/L   Potassium 5.3 (H) 3.5 - 5.1 mmol/L   Chloride 89 (L) 98 - 111 mmol/L   CO2 28 22 - 32 mmol/L   Glucose, Bld 149 (H) 70 - 99 mg/dL   BUN 55 (H) 8 - 23 mg/dL   Creatinine, Ser 1.58 (H) 0.44 - 1.00 mg/dL    Calcium 8.8 (L) 8.9 - 10.3 mg/dL   GFR calc non Af Amer 29 (L) >60 mL/min   GFR calc Af Amer 34 (L) >60 mL/min   Anion gap 9 5 - 15  Basic metabolic panel  Result Value Ref Range   Sodium 129 (L) 135 - 145 mmol/L   Potassium 4.6 3.5 - 5.1 mmol/L   Chloride 92 (L) 98 - 111 mmol/L   CO2 28 22 - 32 mmol/L   Glucose, Bld 157 (H) 70 - 99 mg/dL   BUN 47 (H) 8 - 23 mg/dL   Creatinine, Ser 1.37 (H) 0.44 - 1.00 mg/dL   Calcium 9.0 8.9 - 10.3 mg/dL   GFR calc non Af Amer 35 (L) >60 mL/min   GFR calc Af Amer 40 (L) >60 mL/min   Anion gap 9 5 - 15  Glucose, capillary  Result Value Ref Range   Glucose-Capillary 145 (H) 70 - 99 mg/dL  Glucose, capillary  Result Value Ref Range   Glucose-Capillary 135 (H) 70 - 99 mg/dL  Glucose, capillary  Result Value Ref Range   Glucose-Capillary 168 (H) 70 - 99 mg/dL  Glucose, capillary  Result Value Ref Range   Glucose-Capillary 108 (H) 70 - 99 mg/dL  Basic metabolic panel  Result Value Ref Range   Sodium 130 (L) 135 - 145 mmol/L   Potassium 4.3 3.5 - 5.1 mmol/L   Chloride 96 (L) 98 - 111 mmol/L   CO2 25 22 - 32 mmol/L   Glucose, Bld 103 (H) 70 - 99 mg/dL   BUN 30 (H) 8 - 23 mg/dL   Creatinine, Ser 1.07 (H) 0.44 - 1.00 mg/dL   Calcium 9.1 8.9 - 10.3 mg/dL   GFR calc non Af Amer 47 (L) >60 mL/min   GFR calc Af Amer 54 (L) >60 mL/min   Anion gap 9 5 - 15  Glucose, capillary  Result Value Ref Range   Glucose-Capillary 133 (H) 70 - 99 mg/dL  Glucose, capillary  Result Value Ref Range   Glucose-Capillary 91 70 - 99 mg/dL  Glucose, capillary  Result Value Ref Range   Glucose-Capillary 170 (H) 70 - 99 mg/dL  Glucose, capillary  Result Value Ref Range   Glucose-Capillary 73 70 - 99 mg/dL      Assessment & Plan:   Problem List Items Addressed This Visit    Sacral pain   Relevant Medications   baclofen (LIORESAL) 10 MG tablet   gabapentin (NEURONTIN) 250 MG/5ML solution  Other Relevant Orders   Amb Referral to Palliative Care   Sacral  fracture, closed ([place]) - Primary   Relevant Medications   baclofen (LIORESAL) 10 MG tablet   gabapentin (NEURONTIN) 250 MG/5ML solution   Other Relevant Orders   Amb Referral to Palliative Care   CKD stage 3 due to type 2 diabetes mellitus ([place])   Relevant Orders   Amb Referral to Palliative Care   COMPLETE METABOLIC PANEL WITH GFR   CBC with Differential/Platelet   Anxiety associated with depression   Relevant Orders   Amb Referral to Palliative Care   Anemia in chronic kidney disease (CKD)   Relevant Orders   CBC with Differential/Platelet    Other Visit Diagnoses    Hyponatremia       Relevant Orders   COMPLETE METABOLIC PANEL WITH GFR   Restless leg syndrome       Relevant Medications   baclofen (LIORESAL) 10 MG tablet   gabapentin (NEURONTIN) 250 MG/5ML solution   Other Relevant Orders   Amb Referral to Palliative Care   Psychophysiological insomnia       Relevant Orders   Amb Referral to Palliative Care      #Sacral fracture S/p sacroplasty Follow up with orthopedic as advised Continue with precautions for sitting Use current Oxycodone 5mg  IR q 4 hr PRN up to 4 a day or her previous dose, caution dose increase. No ne refill, has 34 pills approx for about 1 week.  Refill her Baclofen for muscle cramps and pain. Add Gabapentin also for RLS muscle cramps  Referral to Palliative for assist with pain control / anxiety / insomnia / GOC  Add Fish Oil for Dry Mouth Should come off Oxybutynin if no longer effective may cause side effect dry mouth, can f/u with Urology as scheduled on Monday 7/26, had foley previously, having worse incontinence.  HypoNa Recheck chemistry tomorrow Seems improved with reduced fluid intake, sodium tablets, dietary changes.  Orders Placed This Encounter  Procedures  . COMPLETE METABOLIC PANEL WITH GFR  . CBC with Differential/Platelet  . Amb Referral to Palliative Care    Referral Priority:   Routine    Referral Type:   Consultation      Number of Visits Requested:   1     Meds ordered this encounter  Medications  . baclofen (LIORESAL) 10 MG tablet    Sig: Take 0.5-1 tablets (5-10 mg total) by mouth 3 (three) times daily as needed for muscle spasms.    Dispense:  30 each    Refill:  2  . gabapentin (NEURONTIN) 250 MG/5ML solution    Sig: Take 2 mLs (100 mg total) by mouth at bedtime. Every 3-5 days if needed, may increase by 2 additional mL (dose of 83mL or 200mg , then up to max dose 42mL or 300mg )    Dispense:  180 mL    Refill:  0     Follow up plan: Return in about 4 weeks (around 09/04/2019), or if symptoms worsen or fail to improve, for 3-4 weeks follow-up Pain / Palliative / Anxiety / Sodium.  Future labs ordered for tomorrow CMET, CBC   Nobie Putnam, DO [facility] 08/07/2019, 3:40 PM

## 2019-08-07 NOTE — Patient Instructions (Addendum)
Thank you for coming to the office today.  Urologist on Monday.  Recommend to start taking Tylenol Extra Strength 500mg  tabs - take 1 to 2 tabs per dose (max 1000mg ) every 6-8 hours for pain (take regularly, don't skip a dose for next 7 days), max 24 hour daily dose is 6 tablets or 3000mg . In the future you can repeat the same everyday Tylenol course for 1-2 weeks at a time.  [email or web address] / [phone] [place], [place] [address] Ph: [phone]  Try Fish Oil Omega3 1,000 to 2,000 mg per dose once or twice a day - for Dry Mouth  Stay tuned for referral to Palliative, home visit for symptom management  Keep on Oxycodone, don't run out, we can order more, let us know a few days prior to running out, but let's get palliative to help with this.  They can help with sleep and stress and anxiety.  Start taking Baclofen (Lioresal) 10mg  (muscle relaxant) - start with half (cut) to one whole pill at night as needed for next 1-3 nights (may make you drowsy, caution with driving) see how it affects you, then if tolerated increase to one pill 2 to 3 times a day or (every 8 hours as needed)  --------------------------   DUE for FASTING BLOOD WORK (no food or drink after midnight before the lab appointment, only water or coffee without cream/sugar on the morning of)  SCHEDULE "Lab Only" visit in the morning at the clinic for lab draw in 1 day  - Make sure Lab Only appointment is at about 1 week before your next appointment, so that results will be available  For Lab Results, once available within 2-3 days of blood draw, you can can log in to MyChart online to view your results and a brief explanation. Also, we can discuss results at next follow-up visit.    Please schedule a Follow-up Appointment to: Return in about 4 weeks (around 09/04/2019), or if symptoms worsen or fail to improve, for 3-4 weeks follow-up Pain / Palliative / Anxiety / Sodium.  If you  have any other questions or concerns, please feel free to call the office or send a message through [place]. You may also schedule an earlier appointment if necessary.  Additionally, you may be receiving a survey about your experience at our office within a few days to 1 week by e-mail or mail. We value your feedback.  Nobie Putnam, DO [place]

## 2019-08-08 ENCOUNTER — Other Ambulatory Visit: Payer: PPO

## 2019-08-08 DIAGNOSIS — E1122 Type 2 diabetes mellitus with diabetic chronic kidney disease: Secondary | ICD-10-CM | POA: Diagnosis not present

## 2019-08-08 DIAGNOSIS — N183 Chronic kidney disease, stage 3 unspecified: Secondary | ICD-10-CM | POA: Diagnosis not present

## 2019-08-08 DIAGNOSIS — D631 Anemia in chronic kidney disease: Secondary | ICD-10-CM | POA: Diagnosis not present

## 2019-08-08 DIAGNOSIS — N1831 Chronic kidney disease, stage 3a: Secondary | ICD-10-CM | POA: Diagnosis not present

## 2019-08-08 DIAGNOSIS — E871 Hypo-osmolality and hyponatremia: Secondary | ICD-10-CM | POA: Diagnosis not present

## 2019-08-09 ENCOUNTER — Ambulatory Visit: Payer: Self-pay

## 2019-08-09 ENCOUNTER — Telehealth: Payer: Self-pay

## 2019-08-09 DIAGNOSIS — S32509D Unspecified fracture of unspecified pubis, subsequent encounter for fracture with routine healing: Secondary | ICD-10-CM | POA: Diagnosis not present

## 2019-08-09 DIAGNOSIS — N189 Chronic kidney disease, unspecified: Secondary | ICD-10-CM | POA: Diagnosis not present

## 2019-08-09 DIAGNOSIS — D631 Anemia in chronic kidney disease: Secondary | ICD-10-CM | POA: Diagnosis not present

## 2019-08-09 DIAGNOSIS — Z86010 Personal history of colonic polyps: Secondary | ICD-10-CM | POA: Diagnosis not present

## 2019-08-09 DIAGNOSIS — K219 Gastro-esophageal reflux disease without esophagitis: Secondary | ICD-10-CM | POA: Diagnosis not present

## 2019-08-09 DIAGNOSIS — R339 Retention of urine, unspecified: Secondary | ICD-10-CM | POA: Diagnosis not present

## 2019-08-09 DIAGNOSIS — S3210XD Unspecified fracture of sacrum, subsequent encounter for fracture with routine healing: Secondary | ICD-10-CM | POA: Diagnosis not present

## 2019-08-09 DIAGNOSIS — F329 Major depressive disorder, single episode, unspecified: Secondary | ICD-10-CM | POA: Diagnosis not present

## 2019-08-09 DIAGNOSIS — I129 Hypertensive chronic kidney disease with stage 1 through stage 4 chronic kidney disease, or unspecified chronic kidney disease: Secondary | ICD-10-CM | POA: Diagnosis not present

## 2019-08-09 DIAGNOSIS — M1990 Unspecified osteoarthritis, unspecified site: Secondary | ICD-10-CM | POA: Diagnosis not present

## 2019-08-09 DIAGNOSIS — Z87442 Personal history of urinary calculi: Secondary | ICD-10-CM | POA: Diagnosis not present

## 2019-08-09 DIAGNOSIS — E039 Hypothyroidism, unspecified: Secondary | ICD-10-CM | POA: Diagnosis not present

## 2019-08-09 DIAGNOSIS — Z9181 History of falling: Secondary | ICD-10-CM | POA: Diagnosis not present

## 2019-08-09 DIAGNOSIS — E1122 Type 2 diabetes mellitus with diabetic chronic kidney disease: Secondary | ICD-10-CM | POA: Diagnosis not present

## 2019-08-09 DIAGNOSIS — K5900 Constipation, unspecified: Secondary | ICD-10-CM | POA: Diagnosis not present

## 2019-08-09 DIAGNOSIS — E785 Hyperlipidemia, unspecified: Secondary | ICD-10-CM | POA: Diagnosis not present

## 2019-08-09 LAB — COMPLETE METABOLIC PANEL WITH GFR
AG Ratio: 1.7 (calc) (ref 1.0–2.5)
ALT: 13 U/L (ref 6–29)
AST: 16 U/L (ref 10–35)
Albumin: 4 g/dL (ref 3.6–5.1)
Alkaline phosphatase (APISO): 90 U/L (ref 37–153)
BUN/Creatinine Ratio: 12 (calc) (ref 6–22)
BUN: 15 mg/dL (ref 7–25)
CO2: 23 mmol/L (ref 20–32)
Calcium: 9.5 mg/dL (ref 8.6–10.4)
Chloride: 92 mmol/L — ABNORMAL LOW (ref 98–110)
Creat: 1.23 mg/dL — ABNORMAL HIGH (ref 0.60–0.88)
GFR, Est African American: 46 mL/min/{1.73_m2} — ABNORMAL LOW (ref >=60)
GFR, Est Non African American: 39 mL/min/{1.73_m2} — ABNORMAL LOW (ref >=60)
Globulin: 2.3 g/dL (calc) (ref 1.9–3.7)
Glucose, Bld: 287 mg/dL — ABNORMAL HIGH (ref 65–99)
Potassium: 5.2 mmol/L (ref 3.5–5.3)
Sodium: 127 mmol/L — ABNORMAL LOW (ref 135–146)
Total Bilirubin: 0.5 mg/dL (ref 0.2–1.2)
Total Protein: 6.3 g/dL (ref 6.1–8.1)

## 2019-08-09 LAB — CBC WITH DIFFERENTIAL/PLATELET
Absolute Monocytes: 832 cells/uL (ref 200–950)
Basophils Absolute: 54 cells/uL (ref 0–200)
Basophils Relative: 0.5 %
Eosinophils Absolute: 119 cells/uL (ref 15–500)
Eosinophils Relative: 1.1 %
HCT: 30.4 % — ABNORMAL LOW (ref 35.0–45.0)
Hemoglobin: 9.9 g/dL — ABNORMAL LOW (ref 11.7–15.5)
Lymphs Abs: 2948 cells/uL (ref 850–3900)
MCH: 31.4 pg (ref 27.0–33.0)
MCHC: 32.6 g/dL (ref 32.0–36.0)
MCV: 96.5 fL (ref 80.0–100.0)
MPV: 8 fL (ref 7.5–12.5)
Monocytes Relative: 7.7 %
Neutro Abs: 6847 cells/uL (ref 1500–7800)
Neutrophils Relative %: 63.4 %
Platelets: 487 10*3/uL — ABNORMAL HIGH (ref 140–400)
RBC: 3.15 10*6/uL — ABNORMAL LOW (ref 3.80–5.10)
RDW: 13 % (ref 11.0–15.0)
Total Lymphocyte: 27.3 %
WBC: 10.8 10*3/uL (ref 3.8–10.8)

## 2019-08-09 NOTE — Telephone Encounter (Signed)
Pt. Calling to get lab results. States "I take salt pills for sodium and my left foot and ankle are swollen." Noticed it this morning. No other symptoms. No redness, no pain.Please advise pt.  Answer Assessment - Initial Assessment Questions 1. ONSET: "When did the swelling start?" (e.g., minutes, hours, days)     This morning 2. LOCATION: "What part of the leg is swollen?"  "Are both legs swollen or just one leg?"     Left leg - foot and ankle 3. SEVERITY: "How bad is the swelling?" (e.g., localized; mild, moderate, severe)  - Localized - small area of swelling localized to one leg  - MILD pedal edema - swelling limited to foot and ankle, pitting edema < 1/4 inch (6 mm) deep, rest and elevation eliminate most or all swelling  - MODERATE edema - swelling of lower leg to knee, pitting edema > 1/4 inch (6 mm) deep, rest and elevation only partially reduce swelling  - SEVERE edema - swelling extends above knee, facial or hand swelling present      Mild 4. REDNESS: "Does the swelling look red or infected?"     No 5. PAIN: "Is the swelling painful to touch?" If Yes, ask: "How painful is it?"   (Scale 1-10; mild, moderate or severe)     No 6. FEVER: "Do you have a fever?" If Yes, ask: "What is it, how was it measured, and when did it start?"      No 7. CAUSE: "What do you think is causing the leg swelling?"     Unsure 8. MEDICAL HISTORY: "Do you have a history of heart failure, kidney disease, liver failure, or cancer?"     Kidney 9. RECURRENT SYMPTOM: "Have you had leg swelling before?" If Yes, ask: "When was the last time?" "What happened that time?"     No 10. OTHER SYMPTOMS: "Do you have any other symptoms?" (e.g., chest pain, difficulty breathing)       No 11. PREGNANCY: "Is there any chance you are pregnant?" "When was your last menstrual period?"       No  Protocols used: LEG SWELLING AND EDEMA-A-AH

## 2019-08-09 NOTE — Telephone Encounter (Signed)
Forwarded results to [facility] Clinical Pool  Sodium is improved. It is at 127, this is fairly stable, but it is not back to normal. The glucose was higher, this actually means that the sodium is better.  Kidney function has improved but still it is chronically reduced.  Anemia is stable without any worsening.  For swelling, I would recommend - RICE therapy. Rest, ice, compression (ACE wrap, or stockings) and elevation.  Keep appointment with Palliative Care tomorrow on Thurs 7/22  Lisa Mills, [facility] 08/09/2019, 3:57 PM

## 2019-08-09 NOTE — Telephone Encounter (Signed)
Palliative care SW spoke with patient and scheduled in home visit. Visit confirmed for Thu 08-10-2019 @0930 .

## 2019-08-09 NOTE — Telephone Encounter (Signed)
Patient advised.

## 2019-08-10 ENCOUNTER — Other Ambulatory Visit: Payer: Self-pay

## 2019-08-10 ENCOUNTER — Other Ambulatory Visit: Payer: PPO

## 2019-08-10 DIAGNOSIS — Z515 Encounter for palliative care: Secondary | ICD-10-CM

## 2019-08-10 NOTE — Progress Notes (Signed)
COMMUNITY PALLIATIVE CARE SW NOTE  PATIENT NAME: Lisa Mills DOB: 1929-07-13 MRN: [id number]  PRIMARY CARE PROVIDER: Olin Hauser, DO  RESPONSIBLE PARTY:  Acct ID - Guarantor Home Phone Work Phone Relationship Acct Type  [id number] ERCELL, [phone]  Self P/F     [place], [place] [address]     PLAN OF CARE and INTERVENTIONS:             1. GOALS OF CARE/ ADVANCE CARE PLANNING: Patient is a DNR. Son is HCPOA. Patient has living will. Patients goal is to become independent again and gain strength in legs.  2. SOCIAL/EMOTIONAL/SPIRITUAL ASSESSMENT/ INTERVENTIONS:  SW Met with patient in patients home, patients caregiver, Janace Hoard was present. Patient shared that she is taking it one day at a time and today is a good day for her. Patient recently loss her husband in April and had a fall in May which resulted in her recent hospitalization. Patient suffers from recent sacrum fx and was recently discharged from [facility] on Fri 08-04-2019. Patient lives alone but has support from her son who visits daily and stays with her on the weekend and her daughter who lives in [place] but has been visiting and staying with patient more often now. Patients caregiver started yesterday and is a friend of the family. Patient shared that she is in some sort of pain daily but it is not unbearable and that she takes pain medication that helps. Patient stated that she is schedueled to see a urologist on Mon 08-14-2019 due to having incontinence episodes. Patient shares that her sodium levels have been a main focus of her medical conditions and was told yesterday that they have improved but are not within normal limits, patient takes four sodium pills a day. Patient shares that she manages all her medications on her own and has them all lined up on her kitchen counter. Patient's appetite has improved over, she had not had an appetite mostly since the passing of her husband. Pt does not keep  up with her weight but knows she had lost nearly 12 lbs since April. Patient shared that she does not have a cough but does become SOB when talking, due to feeling anxious and nervous. SW, discussed goals, reviewed care plan, provided emotional support, used active and reflective listening. SW provided education on ensuring patient follows doctors recommendations of keeping her legs elevated to prevent swelling and education and information on palliative care. Patient is in agreement with palliative care services. Will continue to follow for symptom management and grief support.    3. PATIENT/CAREGIVER EDUCATION/ COPING:  Patient A&O x3 and engaged in discussion with SW. Patient had moments of becoming tearful when talking about her deceased husband and required time to breath deeply before starting her next sentence or thoughts. Patient can benefit from continued grief support. Patient has not gone through grieving process yet. Family is supportive and visits and stays with patient often/daily. 4. PERSONAL EMERGENCY PLAN:  Patient to call 911 for emergencies. 5. COMMUNITY RESOURCES COORDINATION/ HEALTH CARE NAVIGATION:  Patient manages her own care with the assistance of her son. [facility] to begin PT/OT services next week. Patient has private/persoanl caregiver 7 days a week from 8am-Noon, mostly for companionship.  6. FINANCIAL/LEGAL CONCERNS/INTERVENTIONS:  None     SOCIAL HX:  Social History   Tobacco Use  . Smoking status: Never Smoker  . Smokeless tobacco: Never Used  Substance Use Topics  . Alcohol use:  Not Currently    CODE STATUS: DNR ADVANCED DIRECTIVES: Y MOST FORM COMPLETE:  N HOSPICE EDUCATION PROVIDED: N  PPS: Patient is ambulatory with RW and is able to complete ADLS independently. Patient shared that she has not attempted to bathe her self since being home due to not having much leg strength. Family provides meals.   Time Spent: 55 min.   Doreene Eland, [place]

## 2019-08-11 ENCOUNTER — Other Ambulatory Visit: Payer: Self-pay | Admitting: Family Medicine

## 2019-08-11 ENCOUNTER — Inpatient Hospital Stay: Payer: PPO | Admitting: Family Medicine

## 2019-08-11 DIAGNOSIS — Z515 Encounter for palliative care: Secondary | ICD-10-CM

## 2019-08-11 DIAGNOSIS — S32592D Other specified fracture of left pubis, subsequent encounter for fracture with routine healing: Secondary | ICD-10-CM

## 2019-08-11 MED ORDER — OXYCODONE HCL 5 MG PO TABS: 5.0000 mg | ORAL_TABLET | Freq: Four times a day (QID) | ORAL | 0 refills | Status: DC | PRN

## 2019-08-11 NOTE — Telephone Encounter (Signed)
Requested medication (s) are due for refill today:   unknown  Requested medication (s) are on the active medication list:   Yes  Future visit scheduled:   Yes   Last ordered: Prescribed by a historical provider.   Requested Prescriptions  Pending Prescriptions Disp Refills   oxyCODONE (OXY IR/ROXICODONE) 5 MG immediate release tablet [Pharmacy Med Name: OXYCODONE HCL 5 MG TAB] 30 tablet     Sig: TAKE 1 TABLET BY MOUTH EVERY 4 HOURS AS NEEDED FOR PAIN      Not Delegated - Analgesics:  Opioid Agonists Failed - 08/11/2019 12:01 PM      Failed - This refill cannot be delegated      Failed - Urine Drug Screen completed in last 360 days.      Passed - Valid encounter within last 6 months    Recent Outpatient Visits           4 days ago Closed fracture of sacrum, unspecified portion of sacrum, sequela   [place], DO   1 month ago Closed fracture of multiple rami of left pubis with routine healing, subsequent encounter   [place], DO   1 month ago Acute hip pain, left   [place], DO   4 months ago Controlled type 2 diabetes mellitus with diabetic nephropathy, without long-term current use of insulin [facility])   [place], DO   7 months ago Primary osteoarthritis involving multiple joints   [place], Devonne Doughty, DO       Future Appointments             In 3 days MacDiarmid, Nicki Reaper, MD [place]   In 1 month [place], Devonne Doughty, [facility], [facility]

## 2019-08-11 NOTE — Telephone Encounter (Signed)
Requested medication (s) are due for refill today:   unknown  Requested medication (s) are on the active medication list:   Yes  Future visit scheduled:   Yes in 1 mo.   Last ordered: 2 wks ago  Returned because this is a non delegated refill.   Requested Prescriptions  Pending Prescriptions Disp Refills   oxyCODONE (OXY IR/ROXICODONE) 5 MG immediate release tablet 20 tablet 0    Sig: Take 1 tablet (5 mg total) by mouth every 4 (four) hours as needed for moderate pain, severe pain or breakthrough pain.      Not Delegated - Analgesics:  Opioid Agonists Failed - 08/11/2019  8:06 AM      Failed - This refill cannot be delegated      Failed - Urine Drug Screen completed in last 360 days.      Passed - Valid encounter within last 6 months    Recent Outpatient Visits           4 days ago Closed fracture of sacrum, unspecified portion of sacrum, sequela   [place], DO   1 month ago Closed fracture of multiple rami of left pubis with routine healing, subsequent encounter   [place], DO   1 month ago Acute hip pain, left   [place], DO   4 months ago Controlled type 2 diabetes mellitus with diabetic nephropathy, without long-term current use of insulin [facility])   [place], DO   7 months ago Primary osteoarthritis involving multiple joints   [place], Devonne Doughty, DO       Future Appointments             In 3 days MacDiarmid, Nicki Reaper, MD [place]   In 1 month Parks Ranger, Devonne Doughty, [facility], [facility]

## 2019-08-11 NOTE — Telephone Encounter (Signed)
Copied from [place] [id number]. Topic: Quick Communication - Rx Refill/Question >> Aug 11, 2019  8:01 AM Leward Quan A wrote: Medication: oxyCODONE (OXY IR/ROXICODONE) 5 MG immediate release tablet   Has the patient contacted their pharmacy? Yes.   (Agent: If no, request that the patient contact the pharmacy for the refill.) (Agent: If yes, when and what did the pharmacy advise?)  Preferred Pharmacy (with phone number or street name): [facility] [place], [place].  Phone:  [phone] Fax:  [phone]     Agent: Please be advised that RX refills may take up to 3 business days. We ask that you follow-up with your pharmacy.

## 2019-08-14 ENCOUNTER — Ambulatory Visit (INDEPENDENT_AMBULATORY_CARE_PROVIDER_SITE_OTHER): Payer: PPO | Admitting: Urology

## 2019-08-14 ENCOUNTER — Ambulatory Visit: Payer: Self-pay | Admitting: Family Medicine

## 2019-08-14 ENCOUNTER — Telehealth: Payer: Self-pay | Admitting: Family Medicine

## 2019-08-14 ENCOUNTER — Other Ambulatory Visit: Payer: Self-pay

## 2019-08-14 ENCOUNTER — Encounter: Payer: Self-pay | Admitting: Urology

## 2019-08-14 ENCOUNTER — Other Ambulatory Visit: Payer: Self-pay | Admitting: Family Medicine

## 2019-08-14 VITALS — BP 167/75 | HR 99 | Ht 59.0 in | Wt 118.8 lb

## 2019-08-14 DIAGNOSIS — R3129 Other microscopic hematuria: Secondary | ICD-10-CM | POA: Diagnosis not present

## 2019-08-14 DIAGNOSIS — E1122 Type 2 diabetes mellitus with diabetic chronic kidney disease: Secondary | ICD-10-CM | POA: Diagnosis not present

## 2019-08-14 DIAGNOSIS — N3946 Mixed incontinence: Secondary | ICD-10-CM | POA: Diagnosis not present

## 2019-08-14 DIAGNOSIS — Z87442 Personal history of urinary calculi: Secondary | ICD-10-CM | POA: Diagnosis not present

## 2019-08-14 DIAGNOSIS — Z86010 Personal history of colonic polyps: Secondary | ICD-10-CM | POA: Diagnosis not present

## 2019-08-14 DIAGNOSIS — Z9181 History of falling: Secondary | ICD-10-CM | POA: Diagnosis not present

## 2019-08-14 DIAGNOSIS — S3210XD Unspecified fracture of sacrum, subsequent encounter for fracture with routine healing: Secondary | ICD-10-CM | POA: Diagnosis not present

## 2019-08-14 DIAGNOSIS — M1990 Unspecified osteoarthritis, unspecified site: Secondary | ICD-10-CM | POA: Diagnosis not present

## 2019-08-14 DIAGNOSIS — N189 Chronic kidney disease, unspecified: Secondary | ICD-10-CM | POA: Diagnosis not present

## 2019-08-14 DIAGNOSIS — D631 Anemia in chronic kidney disease: Secondary | ICD-10-CM | POA: Diagnosis not present

## 2019-08-14 DIAGNOSIS — E785 Hyperlipidemia, unspecified: Secondary | ICD-10-CM | POA: Diagnosis not present

## 2019-08-14 DIAGNOSIS — K219 Gastro-esophageal reflux disease without esophagitis: Secondary | ICD-10-CM | POA: Diagnosis not present

## 2019-08-14 DIAGNOSIS — S32509D Unspecified fracture of unspecified pubis, subsequent encounter for fracture with routine healing: Secondary | ICD-10-CM | POA: Diagnosis not present

## 2019-08-14 DIAGNOSIS — I129 Hypertensive chronic kidney disease with stage 1 through stage 4 chronic kidney disease, or unspecified chronic kidney disease: Secondary | ICD-10-CM | POA: Diagnosis not present

## 2019-08-14 DIAGNOSIS — F329 Major depressive disorder, single episode, unspecified: Secondary | ICD-10-CM | POA: Diagnosis not present

## 2019-08-14 DIAGNOSIS — K5900 Constipation, unspecified: Secondary | ICD-10-CM | POA: Diagnosis not present

## 2019-08-14 DIAGNOSIS — E039 Hypothyroidism, unspecified: Secondary | ICD-10-CM | POA: Diagnosis not present

## 2019-08-14 DIAGNOSIS — R339 Retention of urine, unspecified: Secondary | ICD-10-CM | POA: Diagnosis not present

## 2019-08-14 MED ORDER — CIPROFLOXACIN HCL 500 MG PO TABS
500.0000 mg | ORAL_TABLET | Freq: Two times a day (BID) | ORAL | 0 refills | Status: DC
Start: 2019-08-14 — End: 2019-09-13

## 2019-08-14 MED ORDER — NITROFURANTOIN MACROCRYSTAL 100 MG PO CAPS
100.0000 mg | ORAL_CAPSULE | Freq: Every day | ORAL | 11 refills | Status: DC
Start: 2019-08-14 — End: 2019-11-27

## 2019-08-14 NOTE — Progress Notes (Signed)
08/14/2019 3:07 PM   Lisa Mills 1929-04-19 [id number]  Referring provider: Olin Hauser, DO [address] [place],  [place] [address]  Chief Complaint  Patient presents with   Urinary Frequency    HPI: Patient was recently discharged from the hospital with pelvic fracture.  She fractured the sacrum bilaterally.  She had surgery.  She was having a lot of sacral pain.  She had a Foley catheter.  She said she could only void standing and was incontinent of urine and feces.  She had leg weakness.  She was seen by neurosurgery and they did not think she had cauda equina syndrome.  I was consulted to assess the patient's urinary incontinence.  She has high-volume voiding when she goes from a sitting to standing position that she does not feel.  She is not feeling urgency.  She has stress incontinence and moderately severe bedwetting.  She wears 6 pads a day sometimes damp and sometimes soaked.  8 months ago she described foot on the floor syndrome but she says her incontinence is now much worse since the fractured tailbone and pelvis.  She is now mobile with a walker and things are little bit better than they were a few weeks ago.  She is now going twice a night instead of 4 times.  She is voiding every 2 hours.  She recently in rehab center was treated for a bladder infection but does not normally get them.  Urine currently foul-smelling  She has a prescription for oxybutynin 5 mg 4 times a day but she is never tried it  Patient having a tremendous time having bowel movements.  Apparently being treated and has not had a good bowel movement for a week.  She may have decreased sensation in the perineum area but was nonspecific  No history of bladder surgery.  No history of kidney stones.     PMH: Past Medical History:  Diagnosis Date   Dermatophytosis of nail    GERD (gastroesophageal reflux disease)    History of shingles    Hypertension    Keratoderma      Surgical History: Past Surgical History:  Procedure Laterality Date   BREAST BIOPSY Right 09/30/2017   Affirm bx-calcs ( X clip),benign   BREAST EXCISIONAL BIOPSY Right    neg   cataracts     COLONOSCOPY W/ POLYPECTOMY     COLONOSCOPY WITH PROPOFOL N/A 02/10/2017   Procedure: COLONOSCOPY WITH PROPOFOL;  Surgeon: Manya Silvas, MD;  Location: [facility] ENDOSCOPY;  Service: Endoscopy;  Laterality: N/A;   ESOPHAGOGASTRODUODENOSCOPY (EGD) WITH PROPOFOL N/A 02/10/2017   Procedure: ESOPHAGOGASTRODUODENOSCOPY (EGD) WITH PROPOFOL;  Surgeon: Manya Silvas, MD;  Location: [facility] ENDOSCOPY;  Service: Endoscopy;  Laterality: N/A;   EYE SURGERY     bilateral cataracts   HAND SURGERY     KIDNEY STONE SURGERY     SACROPLASTY N/A 07/06/2019   Procedure: SACROPLASTY;  Surgeon: Hessie Knows, MD;  Location: [facility] ORS;  Service: Orthopedics;  Laterality: N/A;   TONSILECTOMY, ADENOIDECTOMY, BILATERAL MYRINGOTOMY AND TUBES     TONSILLECTOMY     TRIGGER FINGER RELEASE     Left ring finger   tubercular peritonitis      Home Medications:  Allergies as of 08/14/2019      Reactions   Aspirin Other (See Comments)   Burns stomach   Conray [iothalamate] Hives   IV dye conray-400   Dye Fdc Red [red Dye] Hives   Sulfa Antibiotics  Unknown Reaction, not used in years   Prednisone    Indigestion       Medication List       Accurate as of August 14, 2019  3:07 PM. If you have any questions, ask your nurse or doctor.        baclofen 10 MG tablet Commonly known as: LIORESAL Take 0.5-1 tablets (5-10 mg total) by mouth 3 (three) times daily as needed for muscle spasms.   cholecalciferol 1000 units tablet Commonly known as: VITAMIN D Take 2,000 Units by mouth daily.   cyclobenzaprine 5 MG tablet Commonly known as: FLEXERIL Take 1 tablet (5 mg total) by mouth 3 (three) times daily.   escitalopram 5 MG tablet Commonly known as: LEXAPRO TAKE 1 TABLET BY MOUTH ONCE DAILY WITH  FOOD What changed: See the new instructions.   feeding supplement (ENSURE ENLIVE) Liqd Take 237 mLs by mouth 3 (three) times daily.   fentaNYL 50 MCG/HR Commonly known as: [place] onto the skin.   ferrous sulfate 325 (65 FE) MG tablet Take 325 mg by mouth daily.   gabapentin 250 MG/5ML solution Commonly known as: NEURONTIN Take 2 mLs (100 mg total) by mouth at bedtime. Every 3-5 days if needed, may increase by 2 additional mL (dose of 77m or 2024m then up to max dose 40m29mr 300m440m glimepiride 2 MG tablet Commonly known as: AMARYL Take 1 tablet (2 mg total) by mouth daily. With meal, either breakfast or dinner.   glimepiride 4 MG tablet Commonly known as: AMARYL TAKE 1/2 TO 1 TABLET BY MOUTH ONCE DAILYBEFORE BREAKFAST   glucose blood test strip Commonly known as: ONE TOUCH ULTRA TEST CHECK BLOOD SUGAR UP TO 2 TIMES A DAY.   levothyroxine 25 MCG tablet Commonly known as: SYNTHROID TAKE 1 TABLET BY MOUTH ONCE DAILY ON AN EMPTY STOMACH. WAIT 30 MINUTES BEFORE TAKING OTHER MEDS. What changed: See the new instructions.   lisinopril 5 MG tablet Commonly known as: ZESTRIL Take 5 mg by mouth daily.   Melatonin 10 MG Subl Place 10 mg under the tongue at bedtime as needed (sleep).   omeprazole 20 MG capsule Commonly known as: PRILOSEC TAKE 1 CAPSULE BY MOUTH ONCE DAILY   ONE TOUCH BASIC SYSTEM w/Device Kit Use glucometer to check blood sugar daily.   OneTouch Delica Lancets 33G 90Wc 1 each by Does not apply route 3 (three) times daily. Dx:E11.9   oxybutynin 5 MG tablet Commonly known as: DITROPAN Take 1 tablet (5 mg total) by mouth 4 (four) times daily.   oxyCODONE 5 MG immediate release tablet Commonly known as: Oxy IR/ROXICODONE Take 1 tablet (5 mg total) by mouth every 6 (six) hours as needed for moderate pain, severe pain or breakthrough pain. Max 4 per day.   polyethylene glycol 17 g packet Commonly known as: MIRALAX / GLYCOLAX Take 17 g by mouth  daily as needed for moderate constipation or severe constipation.   senna 8.6 MG Tabs tablet Commonly known as: SENOKOT Take 1 tablet (8.6 mg total) by mouth 2 (two) times daily. What changed:   when to take this  reasons to take this   senna-docusate 8.6-50 MG tablet Commonly known as: Senokot-S Take 1 tablet by mouth 2 (two) times daily.   simvastatin 40 MG tablet Commonly known as: ZOCOR TAKE 1 TABLET BY MOUTH AT BEDTIME   Trulance 3 MG Tabs Generic drug: Plecanatide Take 3 mg by mouth daily.       Allergies:  Allergies  Allergen Reactions   Aspirin Other (See Comments)    Burns stomach   Conray [Iothalamate] Hives    IV dye conray-400   Dye Fdc Red [Red Dye] Hives   Sulfa Antibiotics     Unknown Reaction, not used in years   Prednisone     Indigestion     Family History: Family History  Adopted: Yes  Problem Relation Age of Onset   Breast cancer Neg Hx     Social History:  reports that she has never smoked. She has never used smokeless tobacco. She reports previous alcohol use. She reports that she does not use drugs.  ROS:                                        Physical Exam: BP (!) 167/75 (BP Location: Left Arm, Patient Position: Sitting, Cuff Size: Normal)    Pulse 99    Ht _0  (1.499 m)    Wt 118 lb 12.8 oz (53.9 kg)    BMI 23.99 kg/m   Constitutional:  Alert and oriented, No acute distress. HEENT: [place] AT, moist mucus membranes.  Trachea midline, no masses. Cardiovascular: No clubbing, cyanosis, or edema. Respiratory: Normal respiratory effort, no increased work of breathing. GI: Abdomen is soft, nontender, nondistended, no abdominal masses GU: No CVA tenderness.  Well supported bladder neck with narrow introitus.  Perhaps mild decrease sensation of the perineum Skin: No rashes, bruises or suspicious lesions. Lymph: No cervical or inguinal adenopathy. Neurologic: Grossly intact, no focal deficits, moving all 4  extremities. Psychiatric: Normal mood and affect.  Laboratory Data: Lab Results  Component Value Date   WBC 10.8 08/08/2019   HGB 9.9 (L) 08/08/2019   HCT 30.4 (L) 08/08/2019   MCV 96.5 08/08/2019   PLT 487 (H) 08/08/2019    Lab Results  Component Value Date   CREATININE 1.23 (H) 08/08/2019    No results found for: PSA  No results found for: TESTOSTERONE  Lab Results  Component Value Date   HGBA1C 7.2 (H) 07/10/2019    Urinalysis    Component Value Date/Time   COLORURINE STRAW (A) 07/17/2019 1143   APPEARANCEUR CLEAR (A) 07/17/2019 1143   APPEARANCEUR clear 03/29/2017 0000   LABSPEC 1.005 07/17/2019 1143   PHURINE 8.0 07/17/2019 1143   GLUCOSEU NEGATIVE 07/17/2019 1143   HGBUR NEGATIVE 07/17/2019 1143   BILIRUBINUR NEGATIVE 07/17/2019 1143   BILIRUBINUR Negative (A) 03/29/2017 0000   KETONESUR 5 (A) 07/17/2019 1143   PROTEINUR NEGATIVE 07/17/2019 1143   UROBILINOGEN Normal 03/29/2017 0000   NITRITE NEGATIVE 07/17/2019 1143   LEUKOCYTESUR NEGATIVE 07/17/2019 1143    Pertinent Imaging:   Assessment & Plan: Patient clinically had urge incontinence and likely now has a functional component but has a high risk factor for neurogenic bladder as well.  Hopefully things can also settle with more mobility.  Clinically she may have a bladder infection.  No recent renal x-rays.  I am suspect based upon the presentation and her urinalysis that once again she is infected.  She had a pansensitive bladder infection in June 2021.  I would like to give her ciprofloxacin 250 mg twice a day for 1 week and put her on daily Macrodantin suppression therapy at least short-term.  I would like to see her in about 6 or 7 weeks once her functional status has improved and see if her incontinence  is settled down.  If suppression therapy seems to be working I may or may not continue it.  I do not want to treat her overactive bladder yet or order urodynamics yet.  Too early since her injury.   Patient was primary care for bowel dysfunction doctor.  She understands she could have an element of a neurogenic bladder and time can help with this as well.   There are no diagnoses linked to this encounter.  No follow-ups on file.  Reece Packer, MD  [place] [address], [place], [place] [address] [phone]

## 2019-08-14 NOTE — Telephone Encounter (Addendum)
  Pt reports last normal BM 10 days ago. States had "Small hard BM 2 days ago, just a little bit."  States h/o constipation. Reports takes Miralax and senna tabs both,2 times daily. Has tired suppository "But comes right out." Reports hemorrhoids painful but no bleeding. Denies any abdominal pain, no vomiting. States "Feels like its right at my rectum."  Pt sounds very anxious "I just want an appointment."  Pt advised to try suppository, reviewed proper technique, follow with fleets if needed. Pt is on fluid restrictions.  Attempted to call office, no answer. Did schedule with Dr. Parks Ranger for tomorrow as pt requested; review chronic constipation and hemorrhoids.. Care advise per protocol.  Did place pt on CB for triage to reassess results after care advise.  1820: Attempted to reach pt, "Call cannot be completed at this time." Reason for Disposition . Last bowel movement (BM) > 4 days ago  Answer Assessment - Initial Assessment Questions 1. STOOL PATTERN OR FREQUENCY: "How often do you pass bowel movements (BMs)?"  (Normal range: tid to q 3 days)  "When was the last BM passed?"      "I have a lot of constipation" 2. STRAINING: "Do you have to strain to have a BM?"      Yes 3. RECTAL PAIN: "Does your rectum hurt when the stool comes out?" If Yes, ask: "Do you have hemorrhoids? How bad is the pain?"  (Scale 1-10; or mild, moderate, severe)     Yes, hemorrhoids for years 4. STOOL COMPOSITION: "Are the stools hard?"      2 days ago passed a little, hard 5. BLOOD ON STOOLS: "Has there been any blood on the toilet tissue or on the surface of the BM?" If Yes, ask: "When was the last time?"      no 6. CHRONIC CONSTIPATION: "Is this a new problem for you?"  If no, ask: "How long have you had this problem?" (days, weeks, months)      "years" 7. CHANGES IN DIET OR HYDRATION: "Have there been any recent changes in your diet?" "How much fluids are you drinking consuming on a daily basis?"  "How much have  you had to drink today?"     Fluid restriction 8. MEDICATIONS: "Have you been taking any new medications?" "Are you taking any narcotic pain medications?" (e.g., Vicoden, Percocet, morphine, dilaudid)     no 9. LAXATIVES: "Have you been using any stool softeners, laxatives, or enemas?"  If yes, ask "What, how often, and when was the last time?" "Miralax 2 xs a day, senna 2 times a day." 10.ACTIVITY:  "How much walking do you do every day? on a daily basis?"  "Has your activity level decreased in the past week?"        yes 11. CAUSE: "What do you think is causing the constipation?"        "I have it all the time" 12. OTHER SYMPTOMS: "Do you have any other symptoms?" (e.g., abdominal pain, bloating, fever, vomiting)      no 13. MEDICAL HISTORY: "Do you have a history of hemorrhoids, rectal fissures, or rectal surgery or rectal abscess?"        Yes hemmorhoids  Protocols used: CONSTIPATION-A-AH

## 2019-08-14 NOTE — Telephone Encounter (Signed)
Could you call Lisa Mills - [phone] to check when this patient is next scheduled to have Nurse Practitioner do home visit evaluation for symptom management?  She had Palliative Clinical Social Worker come out to do home visit on 08/10/19.  But I do not see any Nurse Practitioner visit documented or scheduled.  She will be in our office this afternoon (Tues 7/27) so I wanted to try to get this answer before we see her in person today so we can tell her when they will be coming to see her.  Thanks  Nobie Putnam, [facility] 08/14/2019, 6:29 PM

## 2019-08-15 ENCOUNTER — Encounter: Payer: Self-pay | Admitting: Family Medicine

## 2019-08-15 ENCOUNTER — Ambulatory Visit (INDEPENDENT_AMBULATORY_CARE_PROVIDER_SITE_OTHER): Payer: PPO | Admitting: Family Medicine

## 2019-08-15 ENCOUNTER — Other Ambulatory Visit: Payer: Self-pay

## 2019-08-15 VITALS — BP 118/62 | HR 101 | Temp 97.5°F | Ht 59.0 in | Wt 118.0 lb

## 2019-08-15 DIAGNOSIS — T402X5A Adverse effect of other opioids, initial encounter: Secondary | ICD-10-CM

## 2019-08-15 DIAGNOSIS — K644 Residual hemorrhoidal skin tags: Secondary | ICD-10-CM | POA: Diagnosis not present

## 2019-08-15 DIAGNOSIS — K648 Other hemorrhoids: Secondary | ICD-10-CM

## 2019-08-15 DIAGNOSIS — K5903 Drug induced constipation: Secondary | ICD-10-CM | POA: Diagnosis not present

## 2019-08-15 LAB — URINALYSIS, COMPLETE
Bilirubin, UA: NEGATIVE
Glucose, UA: NEGATIVE
Ketones, UA: NEGATIVE
Nitrite, UA: POSITIVE — AB
RBC, UA: NEGATIVE
Specific Gravity, UA: 1.015 (ref 1.005–1.030)
Urobilinogen, Ur: 0.2 mg/dL (ref 0.2–1.0)
pH, UA: 5.5 (ref 5.0–7.5)

## 2019-08-15 LAB — MICROSCOPIC EXAMINATION: WBC, UA: >30 /hpf — AB (ref 0–5)

## 2019-08-15 MED ORDER — HYDROCORTISONE ACETATE 25 MG RE SUPP: 25.0000 mg | Freq: Two times a day (BID) | RECTAL | 0 refills | Status: DC

## 2019-08-15 MED ORDER — HYDROCORTISONE (PERIANAL) 2.5 % EX CREA: 1.0000 "application " | TOPICAL_CREAM | Freq: Two times a day (BID) | CUTANEOUS | 0 refills | Status: DC

## 2019-08-15 NOTE — Patient Instructions (Addendum)
Thank you for coming to the office today.  May try to taper down on the Oxycodone pain medicine.  Would go down to 1 pill 3 times a day for 3 days, then can go down to 1 pill twice a day for 3 days, then down to 1 pill only as needed.  Keep up tylenol dosing.  Recommend to start taking Tylenol Extra Strength 500mg  tabs - take 12 tabs per dose (max 1000mg ) every 6-8 hours or THREE TIMES A DAY. max 24 hour daily dose is 6 tablets or 3000mg . In the future you can repeat the same everyday Tylenol course for 1-2 weeks at a time.   The [facility] Nurse Practitioner should schedule a HOME VISIT soon for symptom management.  For hemorrhoid - Use the rx Anusol suppository twice a day May use Witch Hazel and topical Proctozone cream as needed.   For Constipation (less frequent bowel movement that can be hard dry or involve straining).  Recommend trying OTC Miralax 17g = 1 capful in large glass water once daily for now, try several days to see if working, goal is soft stool or BM 1-2 times daily, if too loose then reduce dose or try every other day. If not effective may need to increase it to 2 doses at once in AM or may do 1 in morning and 1 in afternoon/evening  May repeat enema tomorrow morning if needed.  FOR TODAY - For miralax - you can increase to 2 or 3 tablespoons in one glass at a time. You can increase further if needed. Max dose is 6 tablespoons in 24 hours.  - This medicine is very safe and can be used often without any problem and will not make you dehydrated. It is good for use on AS NEEDED BASIS or even MAINTENANCE therapy for longer term for several days to weeks at a time to help regulate bowel movements  Other more natural remedies or preventative treatment: - Increase hydration with water - Increase fiber in diet (high fiber foods = vegetables, leafy greens, oats/grains) - May take OTC Fiber supplement (metamucil powder or pill/gummy) - May try OTC Probiotic  Please  schedule a Follow-up Appointment to: Return in about 2 weeks (around 08/29/2019), or if symptoms worsen or fail to improve, for constipation, hemorrhoid.  If you have any other questions or concerns, please feel free to call the office or send a message through [facility]. You may also schedule an earlier appointment if necessary.  Additionally, you may be receiving a survey about your experience at our office within a few days to 1 week by e-mail or mail. We value your feedback.  Nobie Putnam, DO [facility]

## 2019-08-15 NOTE — Telephone Encounter (Addendum)
Alright. Sounds good. I still would expect the NP to see her soon, since they have not seen her as far as I can tell yet.  If they call back and need anything else or questions from me, let me know I can talk to them as well.  I was hoping to tell patient today when her home visit from NP would be.  Nobie Putnam, [facility] 08/15/2019, 11:48 AM

## 2019-08-15 NOTE — Telephone Encounter (Signed)
As per  [employer] receptionist, pt is under [facility] service which involves RN and social service monthly and once patient's condition stabilized or changes then NP can see the patient. Ask her for more detail the way how it work like can NP see pt at least once, she mentioned that one of the RN will call me back they all are busy now but can call later.

## 2019-08-15 NOTE — Progress Notes (Signed)
Subjective:    Patient ID: Lisa Mills, female    DOB: 07-26-1929, 86 y.o.   MRN: [id number]  Lisa Mills is a 86 y.o. female presenting on 08/15/2019 for Constipation (Hemorrhoids--bleeding)   HPI   Hemorrhoids, bleeding internal / external Opoid Induced Constipation Sacral fracture, s/p repair Pain Reports since on higher amount opiates for pain after surgery and continued, has had worse constipation. She is asking to reduce her pain medicine. Has not been seen by [facility] NP provider yet Tried miralax 1 tablespoon 1-2 times a day limited relief, tried senna as well. Recently had an enema today this AM. With some improvement and some partial bowel evacuation. She has hemorrhoid with some swollen and bleeding. Tried witch hazel preparation H topical She has home care aide coming out most days for 5+ hours. Denies any dark stools, nausea vomiting, active abdominal pain, dizziness lightheadedness  Depression screen [facility] 2/9 03/15/2019 01/09/2019 11/01/2018  Decreased Interest 0 0 0  Down, Depressed, Hopeless 0 0 0  PHQ - 2 Score 0 0 0  Altered sleeping 0 3 -  Tired, decreased energy 0 0 -  Change in appetite 0 0 -  Feeling bad or failure about yourself  0 0 -  Trouble concentrating 0 0 -  Moving slowly or fidgety/restless 0 0 -  Suicidal thoughts 0 0 -  PHQ-9 Score 0 3 -  Difficult doing work/chores Not difficult at all Not difficult at all -  Some recent data might be hidden    Social History   Tobacco Use  . Smoking status: Never Smoker  . Smokeless tobacco: Never Used  Vaping Use  . Vaping Use: Never used  Substance Use Topics  . Alcohol use: Not Currently  . Drug use: No    Review of Systems Per HPI unless specifically indicated above     Objective:    BP (!) 118/62   Pulse 101   Temp (!) 97.5 F (36.4 C) (Temporal)   Ht 4\' 11"  (1.499 m)   Wt 118 lb (53.5 kg)   SpO2 100%   BMI 23.83 kg/m   Wt Readings from Last 3 Encounters:  08/15/19 118  lb (53.5 kg)  08/14/19 118 lb 12.8 oz (53.9 kg)  08/07/19 118 lb (53.5 kg)    Physical Exam Vitals and nursing note reviewed.  Constitutional:      General: She is not in acute distress.    Appearance: She is well-developed. She is not diaphoretic.     Comments: Chronically ill thin 86 yr old female  HENT:     Head: Normocephalic and atraumatic.     Comments: Mildly hoarse voice  Eyes:     General:        Right eye: No discharge.        Left eye: No discharge.     Conjunctiva/sclera: Conjunctivae normal.  Neck:     Thyroid: No thyromegaly.  Cardiovascular:     Rate and Rhythm: Normal rate and regular rhythm.     Heart sounds: Normal heart sounds. No murmur heard.   Pulmonary:     Effort: Pulmonary effort is normal. No respiratory distress.     Breath sounds: Normal breath sounds. No wheezing or rales.  Musculoskeletal:     Cervical back: Normal range of motion and neck supple.     Comments: Sitting on donut inflatable  Lymphadenopathy:     Cervical: No cervical adenopathy.  Skin:    General: Skin is warm  and dry.     Findings: No erythema or rash.  Neurological:     Mental Status: She is alert and oriented to person, place, and time.  Psychiatric:        Behavior: Behavior normal.     Comments: Well groomed, good eye contact, normal speech and thoughts    Results for orders placed or performed in visit on 08/14/19  Microscopic Examination   Urine  Result Value Ref Range   WBC, UA >30 (A) 0 - 5 /hpf   RBC 0-2 0 - 2 /hpf   Epithelial Cells (non renal) 0-10 0 - 10 /hpf   Renal Epithel, UA 0-10 (A) None seen /hpf   Bacteria, UA Many (A) None seen/Few  Urinalysis, Complete  Result Value Ref Range   Specific Gravity, UA 1.015 1.005 - 1.030   pH, UA 5.5 5.0 - 7.5   Color, UA Yellow Yellow   Appearance Ur Cloudy (A) Clear   Leukocytes,UA 1+ (A) Negative   Protein,UA Trace (A) Negative/Trace   Glucose, UA Negative Negative   Ketones, UA Negative Negative   RBC, UA  Negative Negative   Bilirubin, UA Negative Negative   Urobilinogen, Ur 0.2 0.2 - 1.0 mg/dL   Nitrite, UA Positive (A) Negative   Microscopic Examination See below:       Assessment & Plan:   Problem List Items Addressed This Visit    Internal and external bleeding hemorrhoids   Relevant Medications   hydrocortisone (PROCTOZONE-HC) 2.5 % rectal cream   hydrocortisone (ANUSOL-HC) 25 MG suppository    Other Visit Diagnoses    Therapeutic opioid-induced constipation (OIC)    -  Primary      #Constipation, likely OIC On oxycodone, opiates since prior surgery recently for sacroplasty Discussed bowel regimen May use increased doses Miralax now up to 2-3 tablespoons per glass 1-2 times a day, would reasonably go up to dose 6 tablespoon per day for now if need May still benefit from enema as needed, suppository  #Hemorrhoids, int / ext bleeding Mixture of hemorrhoids causing complication worse with constipation, straining and sacral pain Sent rx procotzone HC topical cream generic and anusol 25mg  suppository to trial Treat constipation first  We will put call into [facility] NP today, already contacted them and we left message for NP to call back, requesting patient to be seen by NP soon for symptom management. She was seen by CSW already but not NP.  She is interested in reducing pain control opioids, has had anxiety worsening and other acute symptoms such as the constipation in setting of multiple chronic conditions and she would meet criteria for palliative care symptom management.   Meds ordered this encounter  Medications  . hydrocortisone (PROCTOZONE-HC) 2.5 % rectal cream    Sig: Place 1 application rectally 2 (two) times daily. For hemorrhoid    Dispense:  30 g    Refill:  0  . hydrocortisone (ANUSOL-HC) 25 MG suppository    Sig: Place 1 suppository (25 mg total) rectally 2 (two) times daily. For 7 days for hemorrhoid    Dispense:  14 suppository    Refill:  0       Follow up plan: Return in about 2 weeks (around 08/29/2019), or if symptoms worsen or fail to improve, for constipation, hemorrhoid.   Nobie Putnam, [facility] 08/15/2019, 11:52 AM

## 2019-08-17 ENCOUNTER — Encounter: Payer: Self-pay | Admitting: Family Medicine

## 2019-08-17 LAB — CULTURE, URINE COMPREHENSIVE

## 2019-08-21 DIAGNOSIS — S3210XD Unspecified fracture of sacrum, subsequent encounter for fracture with routine healing: Secondary | ICD-10-CM | POA: Diagnosis not present

## 2019-08-21 DIAGNOSIS — D631 Anemia in chronic kidney disease: Secondary | ICD-10-CM | POA: Diagnosis not present

## 2019-08-21 DIAGNOSIS — I129 Hypertensive chronic kidney disease with stage 1 through stage 4 chronic kidney disease, or unspecified chronic kidney disease: Secondary | ICD-10-CM | POA: Diagnosis not present

## 2019-08-21 DIAGNOSIS — K5900 Constipation, unspecified: Secondary | ICD-10-CM | POA: Diagnosis not present

## 2019-08-21 DIAGNOSIS — E039 Hypothyroidism, unspecified: Secondary | ICD-10-CM | POA: Diagnosis not present

## 2019-08-21 DIAGNOSIS — N189 Chronic kidney disease, unspecified: Secondary | ICD-10-CM | POA: Diagnosis not present

## 2019-08-21 DIAGNOSIS — K219 Gastro-esophageal reflux disease without esophagitis: Secondary | ICD-10-CM | POA: Diagnosis not present

## 2019-08-21 DIAGNOSIS — E785 Hyperlipidemia, unspecified: Secondary | ICD-10-CM | POA: Diagnosis not present

## 2019-08-21 DIAGNOSIS — Z9181 History of falling: Secondary | ICD-10-CM | POA: Diagnosis not present

## 2019-08-21 DIAGNOSIS — Z87442 Personal history of urinary calculi: Secondary | ICD-10-CM | POA: Diagnosis not present

## 2019-08-21 DIAGNOSIS — R339 Retention of urine, unspecified: Secondary | ICD-10-CM | POA: Diagnosis not present

## 2019-08-21 DIAGNOSIS — Z86010 Personal history of colonic polyps: Secondary | ICD-10-CM | POA: Diagnosis not present

## 2019-08-21 DIAGNOSIS — F329 Major depressive disorder, single episode, unspecified: Secondary | ICD-10-CM | POA: Diagnosis not present

## 2019-08-21 DIAGNOSIS — S32509D Unspecified fracture of unspecified pubis, subsequent encounter for fracture with routine healing: Secondary | ICD-10-CM | POA: Diagnosis not present

## 2019-08-21 DIAGNOSIS — M1990 Unspecified osteoarthritis, unspecified site: Secondary | ICD-10-CM | POA: Diagnosis not present

## 2019-08-21 DIAGNOSIS — E1122 Type 2 diabetes mellitus with diabetic chronic kidney disease: Secondary | ICD-10-CM | POA: Diagnosis not present

## 2019-08-22 DIAGNOSIS — S32509D Unspecified fracture of unspecified pubis, subsequent encounter for fracture with routine healing: Secondary | ICD-10-CM | POA: Diagnosis not present

## 2019-08-22 DIAGNOSIS — S3210XD Unspecified fracture of sacrum, subsequent encounter for fracture with routine healing: Secondary | ICD-10-CM | POA: Diagnosis not present

## 2019-08-22 DIAGNOSIS — E1122 Type 2 diabetes mellitus with diabetic chronic kidney disease: Secondary | ICD-10-CM | POA: Diagnosis not present

## 2019-08-22 DIAGNOSIS — Z9181 History of falling: Secondary | ICD-10-CM | POA: Diagnosis not present

## 2019-08-22 DIAGNOSIS — Z87442 Personal history of urinary calculi: Secondary | ICD-10-CM | POA: Diagnosis not present

## 2019-08-22 DIAGNOSIS — E785 Hyperlipidemia, unspecified: Secondary | ICD-10-CM | POA: Diagnosis not present

## 2019-08-22 DIAGNOSIS — Z86010 Personal history of colonic polyps: Secondary | ICD-10-CM | POA: Diagnosis not present

## 2019-08-22 DIAGNOSIS — I129 Hypertensive chronic kidney disease with stage 1 through stage 4 chronic kidney disease, or unspecified chronic kidney disease: Secondary | ICD-10-CM | POA: Diagnosis not present

## 2019-08-22 DIAGNOSIS — K219 Gastro-esophageal reflux disease without esophagitis: Secondary | ICD-10-CM | POA: Diagnosis not present

## 2019-08-22 DIAGNOSIS — N189 Chronic kidney disease, unspecified: Secondary | ICD-10-CM | POA: Diagnosis not present

## 2019-08-22 DIAGNOSIS — D631 Anemia in chronic kidney disease: Secondary | ICD-10-CM | POA: Diagnosis not present

## 2019-08-22 DIAGNOSIS — M1990 Unspecified osteoarthritis, unspecified site: Secondary | ICD-10-CM | POA: Diagnosis not present

## 2019-08-22 DIAGNOSIS — R339 Retention of urine, unspecified: Secondary | ICD-10-CM | POA: Diagnosis not present

## 2019-08-22 DIAGNOSIS — E039 Hypothyroidism, unspecified: Secondary | ICD-10-CM | POA: Diagnosis not present

## 2019-08-22 DIAGNOSIS — F329 Major depressive disorder, single episode, unspecified: Secondary | ICD-10-CM | POA: Diagnosis not present

## 2019-08-22 DIAGNOSIS — K5900 Constipation, unspecified: Secondary | ICD-10-CM | POA: Diagnosis not present

## 2019-08-28 DIAGNOSIS — E785 Hyperlipidemia, unspecified: Secondary | ICD-10-CM | POA: Diagnosis not present

## 2019-08-28 DIAGNOSIS — Z86010 Personal history of colonic polyps: Secondary | ICD-10-CM | POA: Diagnosis not present

## 2019-08-28 DIAGNOSIS — F329 Major depressive disorder, single episode, unspecified: Secondary | ICD-10-CM | POA: Diagnosis not present

## 2019-08-28 DIAGNOSIS — M1990 Unspecified osteoarthritis, unspecified site: Secondary | ICD-10-CM | POA: Diagnosis not present

## 2019-08-28 DIAGNOSIS — I129 Hypertensive chronic kidney disease with stage 1 through stage 4 chronic kidney disease, or unspecified chronic kidney disease: Secondary | ICD-10-CM | POA: Diagnosis not present

## 2019-08-28 DIAGNOSIS — K219 Gastro-esophageal reflux disease without esophagitis: Secondary | ICD-10-CM | POA: Diagnosis not present

## 2019-08-28 DIAGNOSIS — S3210XD Unspecified fracture of sacrum, subsequent encounter for fracture with routine healing: Secondary | ICD-10-CM | POA: Diagnosis not present

## 2019-08-28 DIAGNOSIS — N189 Chronic kidney disease, unspecified: Secondary | ICD-10-CM | POA: Diagnosis not present

## 2019-08-28 DIAGNOSIS — S32509D Unspecified fracture of unspecified pubis, subsequent encounter for fracture with routine healing: Secondary | ICD-10-CM | POA: Diagnosis not present

## 2019-08-28 DIAGNOSIS — Z87442 Personal history of urinary calculi: Secondary | ICD-10-CM | POA: Diagnosis not present

## 2019-08-28 DIAGNOSIS — S32599D Other specified fracture of unspecified pubis, subsequent encounter for fracture with routine healing: Secondary | ICD-10-CM | POA: Diagnosis not present

## 2019-08-28 DIAGNOSIS — D631 Anemia in chronic kidney disease: Secondary | ICD-10-CM | POA: Diagnosis not present

## 2019-08-28 DIAGNOSIS — K5900 Constipation, unspecified: Secondary | ICD-10-CM | POA: Diagnosis not present

## 2019-08-28 DIAGNOSIS — E039 Hypothyroidism, unspecified: Secondary | ICD-10-CM | POA: Diagnosis not present

## 2019-08-28 DIAGNOSIS — Z9181 History of falling: Secondary | ICD-10-CM | POA: Diagnosis not present

## 2019-08-28 DIAGNOSIS — R339 Retention of urine, unspecified: Secondary | ICD-10-CM | POA: Diagnosis not present

## 2019-08-28 DIAGNOSIS — E1122 Type 2 diabetes mellitus with diabetic chronic kidney disease: Secondary | ICD-10-CM | POA: Diagnosis not present

## 2019-08-30 ENCOUNTER — Telehealth: Payer: Self-pay

## 2019-08-30 NOTE — Telephone Encounter (Signed)
Telephone call to schedule time for NP Ralene Bathe to visit.  Patient in agreement with visit 09/05/19 at 2:00 PM.

## 2019-08-30 NOTE — Telephone Encounter (Signed)
Patient called today following up on urine culture results. Urine culture was positive Cipro given at time of visit will work to treat. Patient states she did complete the course and is start macrobid daily as preventative. She states she is still having leakage. This was previously discussed at her visit. She denies any urinary symptoms at this time except leakage. She was instructed to keep follow up as scheduled and call if she develops increased frequency, urgency, dysuria, blood, fever or chills. Patient verbalized understanding

## 2019-08-31 ENCOUNTER — Telehealth: Payer: Self-pay | Admitting: Primary Care

## 2019-08-31 ENCOUNTER — Other Ambulatory Visit: Payer: Self-pay

## 2019-08-31 ENCOUNTER — Other Ambulatory Visit: Payer: Self-pay | Admitting: Family Medicine

## 2019-08-31 ENCOUNTER — Other Ambulatory Visit: Payer: PPO | Admitting: Primary Care

## 2019-08-31 DIAGNOSIS — M158 Other polyosteoarthritis: Secondary | ICD-10-CM

## 2019-08-31 DIAGNOSIS — Z515 Encounter for palliative care: Secondary | ICD-10-CM | POA: Diagnosis not present

## 2019-08-31 DIAGNOSIS — F418 Other specified anxiety disorders: Secondary | ICD-10-CM

## 2019-08-31 DIAGNOSIS — E1122 Type 2 diabetes mellitus with diabetic chronic kidney disease: Secondary | ICD-10-CM

## 2019-08-31 DIAGNOSIS — S3210XS Unspecified fracture of sacrum, sequela: Secondary | ICD-10-CM

## 2019-08-31 DIAGNOSIS — D631 Anemia in chronic kidney disease: Secondary | ICD-10-CM

## 2019-08-31 DIAGNOSIS — N183 Chronic kidney disease, stage 3 unspecified: Secondary | ICD-10-CM | POA: Diagnosis not present

## 2019-08-31 DIAGNOSIS — F5104 Psychophysiologic insomnia: Secondary | ICD-10-CM

## 2019-08-31 DIAGNOSIS — E1121 Type 2 diabetes mellitus with diabetic nephropathy: Secondary | ICD-10-CM

## 2019-08-31 NOTE — Progress Notes (Signed)
[facility] Consult Note Telephone: [phone]  Fax: [phone]  PATIENT NAME: Lisa Mills [address] [place] [address] [phone] (home)  DOB: Apr 19, 1929 MRN: [id number]  PRIMARY CARE PROVIDER:    Olin Hauser, DO,  [place] [address] [phone]  REFERRING PROVIDER:   Olin Hauser, DO [address] [place],  [place] [address] [phone]  RESPONSIBLE PARTY:   Extended Emergency Contact Information Primary Emergency Contact: Juliann Pulse [place] Phone: [phone] Mobile Phone: [phone] Relation: Son  I met face to face with patient  in home.  ASSESSMENT AND RECOMMENDATIONS:   1. Advance Care Planning/Goals of Care: Goals include to maximize quality of life and symptom management. Patient goals include being in her own home and remaining independent.  2. Symptom Management:   Pain: Acetaminophen arthritis CR 1300 mg q 8 hrs. Endorses great pain esp at hs.We discussed how NSAIDS are contra indicated.  States gabapentin made her dizzy but did relieve her pain.   We reviewed her gabapentin and it's liquid, and she may have gotten confused on dosing. She prefers pills and I would recommend dispensing 50 mg gabapentin and instruct her to titrate to effect. She will take 50 mg for a few nights then increase to 100 mg. She was confused as to the titration of the liquid scheduling as well.   Hemorrhoid: Has had some cream and suppository she felt came out due to copious loose stools. She had taken a laxative and is also on abx. She reports what could be abx related diarrhea, I have requested her to order pro biotics from her pharmacy for delivery.  Incontinence: She reports continual incontinence which is distressing. We discussed Kegel exercises and timed toileting.  Diabetic management: She states some wt loss but this should be corroborated. During interview  she became unable to find words and fatigued appearing. She has been taking her amaryl in the AM. She could not entirely remember what she had for lunch, and didn't have any supper planned. I gave her some snack of cracker and Ensure,  and she became more alert. She needs some guidance and meal planning to have sufficient food and protein intake.  3. Follow up Palliative Care Visit: Palliative care will continue to follow for goals of care clarification and symptom management. Return 1 weeks or prn.  4. Family /Caregiver/Community Supports: Has a caregiver named Angie who comes at 64 am.Son and daughter in the area. Would benefit from more hours of caregivers if possible.  5. Cognitive / Functional decline: A and O x 3. Became slightly confused during interview, relieved by some food intake. Walking well with walker.  I spent 75 minutes providing this consultation,  from 1500 to 1615. More than 50% of the time in this consultation was spent coordinating communication.   CHIEF COMPLAINT: pain in LE, caregiving deficits  HISTORY OF PRESENT ILLNESS:  Lisa Mills is a 86 y.o. year old female with multiple medical problems including DM, neuropathy, debility, mild cognitive impairment, pain. Palliative Care was asked to follow this patient by consultation request of Nobie Putnam * to help address advance care planning and goals of care. This is the initial  visit.  CODE STATUS: TBD  PPS: 60%  HOSPICE ELIGIBILITY/DIAGNOSIS: no  PAST MEDICAL HISTORY:  Past Medical History:  Diagnosis Date   Dermatophytosis of nail    GERD (gastroesophageal reflux disease)    History of  shingles    Hypertension    Keratoderma     SOCIAL HX:  Social History   Tobacco Use   Smoking status: Never Smoker   Smokeless tobacco: Never Used  Substance Use Topics   Alcohol use: Not Currently   FAMILY HX:  Family History  Adopted: Yes  Problem Relation Age of Onset   Breast cancer Neg Hx       ALLERGIES:  Allergies  Allergen Reactions   Aspirin Other (See Comments)    Burns stomach   Conray [Iothalamate] Hives    IV dye conray-400   Dye Fdc Red [Red Dye] Hives   Sulfa Antibiotics     Unknown Reaction, not used in years   Prednisone     Indigestion      PERTINENT MEDICATIONS:  Outpatient Encounter Medications as of 08/31/2019  Medication Sig   Blood Glucose Monitoring Suppl ([place]) w/Device KIT Use glucometer to check blood sugar daily.   feeding supplement, ENSURE ENLIVE, (ENSURE ENLIVE) LIQD Take 237 mLs by mouth 3 (three) times daily.   gabapentin (NEURONTIN) 250 MG/5ML solution Take 2 mLs (100 mg total) by mouth at bedtime. Every 3-5 days if needed, may increase by 2 additional mL (dose of 37m or 203m then up to max dose 74m174mr 300m174m glimepiride (AMARYL) 2 MG tablet Take 1 tablet (2 mg total) by mouth daily. With meal, either breakfast or dinner.   glucose blood (ONE TOUCH ULTRA TEST) test strip CHECK BLOOD SUGAR UP TO 2 TIMES A DAY.   hydrocortisone (ANUSOL-HC) 25 MG suppository Place 1 suppository (25 mg total) rectally 2 (two) times daily. For 7 days for hemorrhoid   hydrocortisone (PROCTOZONE-HC) 2.5 % rectal cream Place 1 application rectally 2 (two) times daily. For hemorrhoid   ONETOUCH DELICA LANCETS 33G 34LC 1 each by Does not apply route 3 (three) times daily. Dx:E11.9   oxyCODONE (OXY IR/ROXICODONE) 5 MG immediate release tablet Take 1 tablet (5 mg total) by mouth every 6 (six) hours as needed for moderate pain, severe pain or breakthrough pain. Max 4 per day.   polyethylene glycol (MIRALAX / GLYCOLAX) 17 g packet Take 17 g by mouth daily as needed for moderate constipation or severe constipation.   senna (SENOKOT) 8.6 MG TABS tablet Take 1 tablet (8.6 mg total) by mouth 2 (two) times daily. (Patient taking differently: Take 1 tablet by mouth 2 (two) times daily as needed for mild constipation or moderate constipation. )    senna-docusate (SENOKOT-S) 8.6-50 MG tablet Take 1 tablet by mouth 2 (two) times daily.   simvastatin (ZOCOR) 40 MG tablet TAKE 1 TABLET BY MOUTH AT BEDTIME (Patient taking differently: Take 40 mg by mouth at bedtime. )   baclofen (LIORESAL) 10 MG tablet Take 0.5-1 tablets (5-10 mg total) by mouth 3 (three) times daily as needed for muscle spasms. (Patient not taking: Reported on 08/31/2019)   cholecalciferol (VITAMIN D) 1000 units tablet Take 2,000 Units by mouth daily.    ciprofloxacin (CIPRO) 500 MG tablet Take 1 tablet (500 mg total) by mouth every 12 (twelve) hours.   cyclobenzaprine (FLEXERIL) 5 MG tablet Take 1 tablet (5 mg total) by mouth 3 (three) times daily. (Patient not taking: Reported on 08/31/2019)   escitalopram (LEXAPRO) 5 MG tablet TAKE 1 TABLET BY MOUTH ONCE DAILY WITH FOOD (Patient taking differently: Take 5 mg by mouth daily. )   ferrous sulfate 325 (65 FE) MG tablet Take 325 mg by mouth daily.  levothyroxine (SYNTHROID) 25 MCG tablet TAKE 1 TABLET BY MOUTH ONCE DAILY ON AN EMPTY STOMACH. WAIT 30 MINUTES BEFORE TAKING OTHER MEDS. (Patient taking differently: Take 25 mcg by mouth daily before breakfast. TAKE 1 TABLET BY MOUTH ONCE DAILY ON AN EMPTY STOMACH. WAIT 30 MINUTES BEFORE TAKING OTHER MEDS.)   lisinopril (ZESTRIL) 5 MG tablet Take 5 mg by mouth daily.   Melatonin 10 MG SUBL Place 10 mg under the tongue at bedtime as needed (sleep).   nitrofurantoin (MACRODANTIN) 100 MG capsule Take 1 capsule (100 mg total) by mouth daily.   omeprazole (PRILOSEC) 20 MG capsule TAKE 1 CAPSULE BY MOUTH ONCE DAILY (Patient taking differently: Take 20 mg by mouth daily. )   Plecanatide (TRULANCE) 3 MG TABS Take 3 mg by mouth daily.   [DISCONTINUED] fentaNYL (DURAGESIC) 50 MCG/HR Place onto the skin.   [DISCONTINUED] glimepiride (AMARYL) 4 MG tablet TAKE 1/2 TO 1 TABLET BY MOUTH ONCE DAILYBEFORE BREAKFAST   No facility-administered encounter medications on file as of  08/31/2019.     PHYSICAL EXAM / ROS:  160/71      Hr   78  RR 18 Current and past weights:4'11"; Reported  113 lb,  Reports 18 lb loss over 3-4 mos. BMI = 22.6 General: frail appearing, thin Cardiovascular: no chest pain reported, no edema  Pulmonary: no cough, no increased SOB, room air Abdomen: appetite fair, endorses diarrhea, incontinent of bowel  GU: denies dysuria, incontinent of urine MSK:  ++ joint and ROM abnormalities, ambulatory with walker Skin: no rashes or wounds reported Neurological: Weakness, Alert and Oriented x 3. 4/10 pain from neuropathy in [place], NP , DNP, MPH, [facility]  COVID-19 PATIENT SCREENING TOOL  Person answering questions: ___________Self____ _____   1.  Is the patient or any family member in the home showing any signs or symptoms regarding respiratory infection?               Person with Symptom- __________NA_________________  a. Fever                                                                          Yes___ No___          ___________________  b. Shortness of breath                                                    Yes___ No___          ___________________ c. Cough/congestion                                       Yes___  No___         ___________________ d. Body aches/pains  Yes___ No___        ____________________ e. Gastrointestinal symptoms (diarrhea, nausea)           Yes___ No___        ____________________  2. Within the past 14 days, has anyone living in the home had any contact with someone with or under investigation for COVID-19?    Yes___ No_X_   Person __________________

## 2019-08-31 NOTE — Telephone Encounter (Signed)
Spoke with patient to see if we could reschedule the visit with the NP from 8/17 to today between 1 - 1:30 PM, and she was in agreement with this.

## 2019-09-01 ENCOUNTER — Telehealth: Payer: Self-pay | Admitting: Family Medicine

## 2019-09-01 DIAGNOSIS — R1084 Generalized abdominal pain: Secondary | ICD-10-CM | POA: Diagnosis not present

## 2019-09-01 DIAGNOSIS — K5900 Constipation, unspecified: Secondary | ICD-10-CM | POA: Diagnosis not present

## 2019-09-01 DIAGNOSIS — S32592D Other specified fracture of left pubis, subsequent encounter for fracture with routine healing: Secondary | ICD-10-CM | POA: Diagnosis not present

## 2019-09-01 DIAGNOSIS — K649 Unspecified hemorrhoids: Secondary | ICD-10-CM | POA: Diagnosis not present

## 2019-09-01 DIAGNOSIS — M160 Bilateral primary osteoarthritis of hip: Secondary | ICD-10-CM | POA: Diagnosis not present

## 2019-09-01 DIAGNOSIS — M47816 Spondylosis without myelopathy or radiculopathy, lumbar region: Secondary | ICD-10-CM | POA: Diagnosis not present

## 2019-09-01 NOTE — Chronic Care Management (AMB) (Signed)
  Chronic Care Management   Note  09/01/2019 Name: SAMARIAH HOKENSON MRN: [id number] DOB: 1929-03-18  ANALEIA ISMAEL is a 86 y.o. year old female who is a primary care patient of Olin Hauser, DO. I reached out to Raenette Rover by phone today in response to a referral sent by Ms. Burnard Bunting PCP, Dr. Parks Ranger     Ms. Burtch was given information about Chronic Care Management services today including:  1. CCM service includes personalized support from designated clinical staff supervised by her physician, including individualized plan of care and coordination with other care providers 2. 24/7 contact phone numbers for assistance for urgent and routine care needs. 3. Service will only be billed when office clinical staff spend 20 minutes or more in a month to coordinate care. 4. Only one practitioner may furnish and bill the service in a calendar month. 5. The patient may stop CCM services at any time (effective at the end of the month) by phone call to the office staff. 6. The patient will be responsible for cost sharing (co-pay) of up to 20% of the service fee (after annual deductible is met).  Patient agreed to services and verbal consent obtained.   Follow up plan: Telephone appointment with care management team member scheduled for: LCSW 09/19/2019, RNCM 09/21/2019  Noreene Larsson, [place], [place], [place] [address] Direct Dial: [phone] [email or web address]

## 2019-09-04 ENCOUNTER — Ambulatory Visit (INDEPENDENT_AMBULATORY_CARE_PROVIDER_SITE_OTHER): Payer: PPO | Admitting: Pharmacist

## 2019-09-04 DIAGNOSIS — K5900 Constipation, unspecified: Secondary | ICD-10-CM | POA: Diagnosis not present

## 2019-09-04 DIAGNOSIS — Z9181 History of falling: Secondary | ICD-10-CM | POA: Diagnosis not present

## 2019-09-04 DIAGNOSIS — R339 Retention of urine, unspecified: Secondary | ICD-10-CM | POA: Diagnosis not present

## 2019-09-04 DIAGNOSIS — Z87442 Personal history of urinary calculi: Secondary | ICD-10-CM | POA: Diagnosis not present

## 2019-09-04 DIAGNOSIS — D631 Anemia in chronic kidney disease: Secondary | ICD-10-CM | POA: Diagnosis not present

## 2019-09-04 DIAGNOSIS — Z86010 Personal history of colonic polyps: Secondary | ICD-10-CM | POA: Diagnosis not present

## 2019-09-04 DIAGNOSIS — S3210XD Unspecified fracture of sacrum, subsequent encounter for fracture with routine healing: Secondary | ICD-10-CM | POA: Diagnosis not present

## 2019-09-04 DIAGNOSIS — K219 Gastro-esophageal reflux disease without esophagitis: Secondary | ICD-10-CM | POA: Diagnosis not present

## 2019-09-04 DIAGNOSIS — N189 Chronic kidney disease, unspecified: Secondary | ICD-10-CM | POA: Diagnosis not present

## 2019-09-04 DIAGNOSIS — E1122 Type 2 diabetes mellitus with diabetic chronic kidney disease: Secondary | ICD-10-CM | POA: Diagnosis not present

## 2019-09-04 DIAGNOSIS — I129 Hypertensive chronic kidney disease with stage 1 through stage 4 chronic kidney disease, or unspecified chronic kidney disease: Secondary | ICD-10-CM | POA: Diagnosis not present

## 2019-09-04 DIAGNOSIS — S32509D Unspecified fracture of unspecified pubis, subsequent encounter for fracture with routine healing: Secondary | ICD-10-CM | POA: Diagnosis not present

## 2019-09-04 DIAGNOSIS — E785 Hyperlipidemia, unspecified: Secondary | ICD-10-CM | POA: Diagnosis not present

## 2019-09-04 DIAGNOSIS — M1990 Unspecified osteoarthritis, unspecified site: Secondary | ICD-10-CM | POA: Diagnosis not present

## 2019-09-04 DIAGNOSIS — F329 Major depressive disorder, single episode, unspecified: Secondary | ICD-10-CM | POA: Diagnosis not present

## 2019-09-04 DIAGNOSIS — E1121 Type 2 diabetes mellitus with diabetic nephropathy: Secondary | ICD-10-CM

## 2019-09-04 DIAGNOSIS — E039 Hypothyroidism, unspecified: Secondary | ICD-10-CM | POA: Diagnosis not present

## 2019-09-04 DIAGNOSIS — N183 Chronic kidney disease, stage 3 unspecified: Secondary | ICD-10-CM

## 2019-09-04 NOTE — Patient Instructions (Signed)
Thank you allowing the Chronic Care Management Team to be a part of your care! It was a pleasure speaking with you today!     CCM (Chronic Care Management) Team    Noreene Larsson RN, MSN, CCM Nurse Care Coordinator  [phone]   Harlow Asa PharmD  Clinical Pharmacist  [phone]   Eula Fried LCSW Clinical Social Worker [phone]  Visit Information  Goals Addressed            This Visit's Progress   . PharmD - Medication Managment       CARE PLAN ENTRY (see longitudinal plan of care for additional care plan information)   Current Barriers:  . Chronic Disease Management support, education, and care coordination needs related to T2DM, CKD, HLD, hypothyroidism, anxiety and constipation  Pharmacist Clinical Goal(s):  Marland Kitchen Over the next 30 days, patient will work with CM Pharmacist to complete medication review and address needs identified  Interventions: . Receive coordination of care message from PCP. Provider referring patient to [facility] Pharmacist for medication review/medication adherence/disease state education.  . Perform chart review . Outreach to patient today to discuss medication management o Patient reports PT coming for appointment today o Schedule appointment to complete medication review . Patient reports she manages her own medications.  o Reports using a weekly pillbox for some of the medications (those that she has been on for a long time), while taking more recent medications from bottle. o Start to discuss larger pillbox to allow space for all maintenance medications, but patient states current method working for her. o Will discuss further with next call. . Discuss with patient importance of blood sugar management and monitoring o Reports taking glimepiride 4mg  -  tablet (2 mg) daily each morning - Counsel to take with meal o Reports has recently lost weight, >10 lbs over past 3 months. o Reports has home blood sugar meter, but denies monitoring  recently - Encourage patient to resume home blood sugar monitoring o Reports unsure if she has had lows recently. States sometimes has felt shaky, but not sure if this is due to other medical concerns - Counsel on s/s of low blood sugar and to check blood sugar if has any symptoms.  - Attempt to discuss how to treat low blood sugars, but patient states that she does not have time today. . Will collaborate with PCP . Provider and Inter-disciplinary care team collaboration (see longitudinal plan of care)  Patient Self Care Activities:  . Attends all scheduled provider appointments o Appointment with Palliative Care NP on 8/19 o Appointment with [facility] GI on 8/23 o Next appointment with PCP on 8/25   Initial goal documentation        Patient verbalizes understanding of instructions provided today.   Telephone follow up appointment with care management team member scheduled for: 9/1 at 12 pm  Harlow Asa, PharmD, [place] [phone]

## 2019-09-04 NOTE — Chronic Care Management (AMB) (Signed)
Chronic Care Management   Follow Up Note   09/04/2019 Name: Lisa Mills MRN: [id number] DOB: 02-28-1929  Referred by: Olin Hauser, DO Reason for referral : Chronic Care Management (Patient Phone Call)   BRITLEE Mills is a 86 y.o. year old female who is a primary care patient of Olin Hauser, DO. The CCM team was consulted for assistance with chronic disease management and care coordination needs.    I reached out to Raenette Rover by phone today.   Review of patient status, including review of consultants reports, relevant laboratory and other test results, and collaboration with appropriate care team members and the patient's provider was performed as part of comprehensive patient evaluation and provision of chronic care management services.     Outpatient Encounter Medications as of 09/04/2019  Medication Sig Note   baclofen (LIORESAL) 10 MG tablet Take 0.5-1 tablets (5-10 mg total) by mouth 3 (three) times daily as needed for muscle spasms. (Patient not taking: Reported on 08/31/2019)    Blood Glucose Monitoring Suppl ([place]) w/Device KIT Use glucometer to check blood sugar daily.    cholecalciferol (VITAMIN D) 1000 units tablet Take 2,000 Units by mouth daily.     ciprofloxacin (CIPRO) 500 MG tablet Take 1 tablet (500 mg total) by mouth every 12 (twelve) hours.    cyclobenzaprine (FLEXERIL) 5 MG tablet Take 1 tablet (5 mg total) by mouth 3 (three) times daily. (Patient not taking: Reported on 08/31/2019)    escitalopram (LEXAPRO) 5 MG tablet TAKE 1 TABLET BY MOUTH ONCE DAILY WITH FOOD (Patient taking differently: Take 5 mg by mouth daily. )    feeding supplement, ENSURE ENLIVE, (ENSURE ENLIVE) LIQD Take 237 mLs by mouth 3 (three) times daily.    ferrous sulfate 325 (65 FE) MG tablet Take 325 mg by mouth daily.    gabapentin (NEURONTIN) 250 MG/5ML solution Take 2 mLs (100 mg total) by mouth at bedtime. Every 3-5 days if needed, may  increase by 2 additional mL (dose of 23m or '200mg'$ , then up to max dose 673mor '300mg'$ )    glimepiride (AMARYL) 2 MG tablet Take 1 tablet (2 mg total) by mouth daily. With meal, either breakfast or dinner.    glucose blood (ONE TOUCH ULTRA TEST) test strip CHECK BLOOD SUGAR UP TO 2 TIMES A DAY.    hydrocortisone (ANUSOL-HC) 25 MG suppository Place 1 suppository (25 mg total) rectally 2 (two) times daily. For 7 days for hemorrhoid    hydrocortisone (PROCTOZONE-HC) 2.5 % rectal cream Place 1 application rectally 2 (two) times daily. For hemorrhoid    levothyroxine (SYNTHROID) 25 MCG tablet TAKE 1 TABLET BY MOUTH ONCE DAILY ON AN EMPTY STOMACH. WAIT 30 MINUTES BEFORE TAKING OTHER MEDS. (Patient taking differently: Take 25 mcg by mouth daily before breakfast. TAKE 1 TABLET BY MOUTH ONCE DAILY ON AN EMPTY STOMACH. WAIT 30 MINUTES BEFORE TAKING OTHER MEDS.)    lisinopril (ZESTRIL) 5 MG tablet Take 5 mg by mouth daily.    Melatonin 10 MG SUBL Place 10 mg under the tongue at bedtime as needed (sleep).    nitrofurantoin (MACRODANTIN) 100 MG capsule Take 1 capsule (100 mg total) by mouth daily.    omeprazole (PRILOSEC) 20 MG capsule TAKE 1 CAPSULE BY MOUTH ONCE DAILY (Patient taking differently: Take 20 mg by mouth daily. )    ONETOUCH DELICA LANCETS 3357QISC 1 each by Does not apply route 3 (three) times daily. Dx:E11.9    oxyCODONE (  OXY IR/ROXICODONE) 5 MG immediate release tablet Take 1 tablet (5 mg total) by mouth every 6 (six) hours as needed for moderate pain, severe pain or breakthrough pain. Max 4 per day.    Plecanatide (TRULANCE) 3 MG TABS Take 3 mg by mouth daily.    polyethylene glycol (MIRALAX / GLYCOLAX) 17 g packet Take 17 g by mouth daily as needed for moderate constipation or severe constipation.    senna (SENOKOT) 8.6 MG TABS tablet Take 1 tablet (8.6 mg total) by mouth 2 (two) times daily. (Patient taking differently: Take 1 tablet by mouth 2 (two) times daily as needed for mild  constipation or moderate constipation. )    senna-docusate (SENOKOT-S) 8.6-50 MG tablet Take 1 tablet by mouth 2 (two) times daily.    simvastatin (ZOCOR) 40 MG tablet TAKE 1 TABLET BY MOUTH AT BEDTIME (Patient taking differently: Take 40 mg by mouth at bedtime. ) 07/04/2019: May interact with patients prednisone   No facility-administered encounter medications on file as of 09/04/2019.    Goals Addressed            This Visit's Progress    PharmD - Medication Managment       CARE PLAN ENTRY (see longitudinal plan of care for additional care plan information)   Current Barriers:   Chronic Disease Management support, education, and care coordination needs related to T2DM, CKD, HLD, hypothyroidism, anxiety and constipation  Pharmacist Clinical Goal(s):   Over the next 30 days, patient will work with CM Pharmacist to complete medication review and address needs identified  Interventions:  Receive coordination of care message from PCP. Provider referring patient to [facility] Pharmacist for medication review/medication adherence/disease state education.   Perform chart review  Outreach to patient today to discuss medication management o Patient reports PT coming for appointment today o Schedule appointment to complete medication review  Patient reports she manages her own medications.  o Reports using a weekly pillbox for some of the medications (those that she has been on for a long time), while taking more recent medications from bottle. o Start to discuss larger pillbox to allow space for all maintenance medications, but patient states current method working for her. o Will discuss further with next call.  Discuss with patient importance of blood sugar management and monitoring o Reports taking glimepiride '4mg'$  -  tablet (2 mg) daily each morning - Counsel to take with meal o Reports has recently lost weight, >10 lbs over past 3 months. o Reports has home blood sugar meter, but  denies monitoring recently - Encourage patient to resume home blood sugar monitoring o Reports unsure if she has had lows recently. States sometimes has felt shaky, but not sure if this is due to other medical concerns - Counsel on s/s of low blood sugar and to check blood sugar if has any symptoms.  - Attempt to discuss how to treat low blood sugars, but patient states that she does not have time today.  Will collaborate with PCP  Provider and Inter-disciplinary care team collaboration (see longitudinal plan of care)  Patient Self Care Activities:   Attends all scheduled provider appointments o Appointment with Palliative Care NP on 8/19 o Appointment with [place] on 8/23 o Next appointment with PCP on 8/25   Initial goal documentation        Plan  Telephone follow up appointment with care management team member scheduled for: 9/1 at 5 pm  Harlow Asa, PharmD, [place]  [facility] [employer] [phone]

## 2019-09-05 ENCOUNTER — Other Ambulatory Visit: Payer: PPO | Admitting: Primary Care

## 2019-09-06 ENCOUNTER — Other Ambulatory Visit: Payer: PPO

## 2019-09-06 DIAGNOSIS — E1169 Type 2 diabetes mellitus with other specified complication: Secondary | ICD-10-CM

## 2019-09-06 DIAGNOSIS — Z0000 Encounter for general adult medical examination without abnormal findings: Secondary | ICD-10-CM

## 2019-09-06 DIAGNOSIS — E038 Other specified hypothyroidism: Secondary | ICD-10-CM | POA: Diagnosis not present

## 2019-09-06 DIAGNOSIS — I129 Hypertensive chronic kidney disease with stage 1 through stage 4 chronic kidney disease, or unspecified chronic kidney disease: Secondary | ICD-10-CM | POA: Diagnosis not present

## 2019-09-06 DIAGNOSIS — E1121 Type 2 diabetes mellitus with diabetic nephropathy: Secondary | ICD-10-CM

## 2019-09-06 DIAGNOSIS — E1122 Type 2 diabetes mellitus with diabetic chronic kidney disease: Secondary | ICD-10-CM

## 2019-09-06 DIAGNOSIS — E785 Hyperlipidemia, unspecified: Secondary | ICD-10-CM | POA: Diagnosis not present

## 2019-09-06 DIAGNOSIS — E063 Autoimmune thyroiditis: Secondary | ICD-10-CM | POA: Diagnosis not present

## 2019-09-06 DIAGNOSIS — N183 Chronic kidney disease, stage 3 unspecified: Secondary | ICD-10-CM | POA: Diagnosis not present

## 2019-09-07 ENCOUNTER — Ambulatory Visit: Payer: Self-pay | Admitting: General Practice

## 2019-09-07 ENCOUNTER — Other Ambulatory Visit: Payer: Self-pay

## 2019-09-07 ENCOUNTER — Other Ambulatory Visit: Payer: PPO | Admitting: Primary Care

## 2019-09-07 DIAGNOSIS — N183 Chronic kidney disease, stage 3 unspecified: Secondary | ICD-10-CM

## 2019-09-07 DIAGNOSIS — F418 Other specified anxiety disorders: Secondary | ICD-10-CM

## 2019-09-07 DIAGNOSIS — E038 Other specified hypothyroidism: Secondary | ICD-10-CM | POA: Diagnosis not present

## 2019-09-07 DIAGNOSIS — S3210XS Unspecified fracture of sacrum, sequela: Secondary | ICD-10-CM

## 2019-09-07 DIAGNOSIS — E1122 Type 2 diabetes mellitus with diabetic chronic kidney disease: Secondary | ICD-10-CM

## 2019-09-07 DIAGNOSIS — E063 Autoimmune thyroiditis: Secondary | ICD-10-CM

## 2019-09-07 DIAGNOSIS — E785 Hyperlipidemia, unspecified: Secondary | ICD-10-CM

## 2019-09-07 DIAGNOSIS — E1121 Type 2 diabetes mellitus with diabetic nephropathy: Secondary | ICD-10-CM

## 2019-09-07 DIAGNOSIS — W19XXXD Unspecified fall, subsequent encounter: Secondary | ICD-10-CM

## 2019-09-07 DIAGNOSIS — E1169 Type 2 diabetes mellitus with other specified complication: Secondary | ICD-10-CM

## 2019-09-07 DIAGNOSIS — Z515 Encounter for palliative care: Secondary | ICD-10-CM

## 2019-09-07 DIAGNOSIS — I129 Hypertensive chronic kidney disease with stage 1 through stage 4 chronic kidney disease, or unspecified chronic kidney disease: Secondary | ICD-10-CM

## 2019-09-07 LAB — LIPID PANEL
Cholesterol: 168 mg/dL (ref <200)
HDL: 66 mg/dL (ref >=50)
LDL Cholesterol (Calc): 75 mg/dL (calc)
Non-HDL Cholesterol (Calc): 102 mg/dL (calc) (ref <130)
Total CHOL/HDL Ratio: 2.5 (calc) (ref <5.0)
Triglycerides: 173 mg/dL — ABNORMAL HIGH (ref <150)

## 2019-09-07 LAB — COMPLETE METABOLIC PANEL WITH GFR
AG Ratio: 1.9 (calc) (ref 1.0–2.5)
ALT: 12 U/L (ref 6–29)
AST: 17 U/L (ref 10–35)
Albumin: 4 g/dL (ref 3.6–5.1)
Alkaline phosphatase (APISO): 69 U/L (ref 37–153)
BUN/Creatinine Ratio: 16 (calc) (ref 6–22)
BUN: 15 mg/dL (ref 7–25)
CO2: 26 mmol/L (ref 20–32)
Calcium: 9.6 mg/dL (ref 8.6–10.4)
Chloride: 102 mmol/L (ref 98–110)
Creat: 0.94 mg/dL — ABNORMAL HIGH (ref 0.60–0.88)
GFR, Est African American: 63 mL/min/{1.73_m2} (ref >=60)
GFR, Est Non African American: 54 mL/min/{1.73_m2} — ABNORMAL LOW (ref >=60)
Globulin: 2.1 g/dL (calc) (ref 1.9–3.7)
Glucose, Bld: 116 mg/dL — ABNORMAL HIGH (ref 65–99)
Potassium: 4.1 mmol/L (ref 3.5–5.3)
Sodium: 136 mmol/L (ref 135–146)
Total Bilirubin: 0.4 mg/dL (ref 0.2–1.2)
Total Protein: 6.1 g/dL (ref 6.1–8.1)

## 2019-09-07 LAB — HEMOGLOBIN A1C
Hgb A1c MFr Bld: 6.6 % of total Hgb — ABNORMAL HIGH (ref <5.7)
Mean Plasma Glucose: 143 (calc)
eAG (mmol/L): 7.9 (calc)

## 2019-09-07 LAB — CBC WITH DIFFERENTIAL/PLATELET
Absolute Monocytes: 525 cells/uL (ref 200–950)
Basophils Absolute: 28 cells/uL (ref 0–200)
Basophils Relative: 0.4 %
Eosinophils Absolute: 142 cells/uL (ref 15–500)
Eosinophils Relative: 2 %
HCT: 30.5 % — ABNORMAL LOW (ref 35.0–45.0)
Hemoglobin: 9.8 g/dL — ABNORMAL LOW (ref 11.7–15.5)
Lymphs Abs: 2513 cells/uL (ref 850–3900)
MCH: 31.1 pg (ref 27.0–33.0)
MCHC: 32.1 g/dL (ref 32.0–36.0)
MCV: 96.8 fL (ref 80.0–100.0)
MPV: 8.8 fL (ref 7.5–12.5)
Monocytes Relative: 7.4 %
Neutro Abs: 3891 cells/uL (ref 1500–7800)
Neutrophils Relative %: 54.8 %
Platelets: 364 10*3/uL (ref 140–400)
RBC: 3.15 10*6/uL — ABNORMAL LOW (ref 3.80–5.10)
RDW: 13.9 % (ref 11.0–15.0)
Total Lymphocyte: 35.4 %
WBC: 7.1 10*3/uL (ref 3.8–10.8)

## 2019-09-07 LAB — TSH: TSH: 5.61 mIU/L — ABNORMAL HIGH (ref 0.40–4.50)

## 2019-09-07 LAB — T4, FREE: Free T4: 1.3 ng/dL (ref 0.8–1.8)

## 2019-09-07 NOTE — Progress Notes (Signed)
[facility] Consult Note Telephone: [phone]  Fax: [phone]  PATIENT NAME: Lisa Mills [address] [place] [address] [phone] (home)  DOB: January 24, 1929 MRN: [id number]  PRIMARY CARE PROVIDER:    Olin Hauser, DO,  [place] [address] [phone]  REFERRING PROVIDER:   Olin Hauser, DO [address] [place],  [place] [address] [phone]  RESPONSIBLE PARTY:   Extended Emergency Contact Information Primary Emergency Contact: Juliann Pulse [place] Phone: [phone] Mobile Phone: [phone] Relation: Son  I met face to face with patient and family in home/facility.  ASSESSMENT AND RECOMMENDATIONS:   1. Advance Care Planning/Goals of Care: Goals include to maximize quality of life and symptom management. Our advance care planning conversation included a discussion about:     The value and importance of advance care planning   Experiences with loved ones who have been seriously ill or have died   Exploration of personal, cultural or spiritual beliefs that might influence medical decisions   Exploration of goals of care in the event of a sudden injury or illness   Identification  of a healthcare agent  - Son Review of an  advance directive document .- to discuss with son.    Patient recounts her husbands death in May 01, 2022. It was fairly sudden hospital death. We discussed her goals of care and I introduced the MOST form. She said that they had done a Living Will but had not really thought about it or looked at it in a while. We discussed the difference between the last will, the Living Will, and the most form or advance directives. I reviewed the choices and she will discuss with her son.   2. Symptom Management:   I met again with patient to do in her home. I had expected to meet her caregiver but while out with patient yesterday, she was pulled over and  arrested for drug possession and shop lifting. Patient is very shaken up about this. We discussed possible venues for finding care workers. Her son is helping her with caregiver hiring.   Med review: We reviewed her meds. She wants to discontinue Flexeril and Robaxin and in fact threw these medicines away today. She also wanted the capsule form of gabapentin so she has thrown out her liquid and I have called in 100 mg capsules for 1 to 2 per night. HS. She describes significant and disruptive restless leg syndrome. She will start with the gabapentin 100 mg capsules, and if that is not helpful perhaps PCP will consider a ropinirole.   She admits to taking synthroid with meals, which may be the reason for her increased TSH. I reviewed taking it on empty stomach, no food x 30 min for absorption.  Nutrition we reviewed some of her labs and her hemoglobin is low. She recounts that she had taken iron for about a year but then stopped probably around March. A review of her previous labs from last year do you show a better hemoglobin so I asked her to go ahead and start back on the iron and treat her constipation more aggressively with MiraLAX and senna rather than discontinue the iron. She is also probably not eating as well as she should we discuss supplements and how she could eat more iron in her diet.   Her electrolytes were also improved on her sodium tablet. She is not fond of them but I encouraged her to  comply with the qid dosing because it was helping her serum sodium. She will but she does report excessive thirst. She has no edema but is plagued by nocturia. We discussed her limiting after dinner liquids if possible due to Nocturia.   Mood: Despite not sleeping well last night patient does seem more alert and a better historian this morning then when I saw her in the late afternoon last week, probably due to fatigue and perhaps not eating well.   The plan is she will resume her iron, try to find some  low glucose meal nutritional supplements, begin her gabapentin, and follow up with PCP next week to discuss any further concerns.   3. Follow up Palliative Care Visit: Palliative care will continue to follow for goals of care clarification and symptom management. I would like to follow up either personally or with a PMPM team within 3 to 4 weeks  4. Family /Caregiver/Community Supports: Lives in own home, alone. Had a paid caregiver. Son oversees care. We discussed facility living, she was reticent.   5. Cognitive / Functional decline:   I spent 60 minutes providing this consultation,  from 0900 to 1000. More than 50% of the time in this consultation was spent coordinating communication.   CHIEF COMPLAINT: leg pain  HISTORY OF PRESENT ILLNESS:  Lisa Mills is a 86 y.o. year old female with multiple medical problems including PND, DM, weight loss. Palliative Care was asked to follow this patient by consultation request of Nobie Putnam * to help address advance care planning and goals of care. This is a follow up visit.  CODE STATUS: TBD  PPS: 60%  HOSPICE ELIGIBILITY/DIAGNOSIS: TBD  PAST MEDICAL HISTORY:  Past Medical History:  Diagnosis Date   Dermatophytosis of nail    GERD (gastroesophageal reflux disease)    History of shingles    Hypertension    Keratoderma     SOCIAL HX:  Social History   Tobacco Use   Smoking status: Never Smoker   Smokeless tobacco: Never Used  Substance Use Topics   Alcohol use: Not Currently   FAMILY HX:  Family History  Adopted: Yes  Problem Relation Age of Onset   Breast cancer Neg Hx     ALLERGIES:  Allergies  Allergen Reactions   Aspirin Other (See Comments)    Burns stomach   Conray [Iothalamate] Hives    IV dye conray-400   Dye Fdc Red [Red Dye] Hives   Sulfa Antibiotics     Unknown Reaction, not used in years   Prednisone     Indigestion      PERTINENT MEDICATIONS:  Outpatient Encounter  Medications as of 09/07/2019  Medication Sig   Blood Glucose Monitoring Suppl ([place]) w/Device KIT Use glucometer to check blood sugar daily.   cholecalciferol (VITAMIN D) 1000 units tablet Take 2,000 Units by mouth daily.    escitalopram (LEXAPRO) 5 MG tablet TAKE 1 TABLET BY MOUTH ONCE DAILY WITH FOOD (Patient taking differently: Take 5 mg by mouth daily. )   feeding supplement, ENSURE ENLIVE, (ENSURE ENLIVE) LIQD Take 237 mLs by mouth 3 (three) times daily.   gabapentin (NEURONTIN) 100 MG capsule Take 100 mg by mouth at bedtime. May increase to 200 mg nightly if needed and no dizziness.   glimepiride (AMARYL) 2 MG tablet Take 1 tablet (2 mg total) by mouth daily. With meal, either breakfast or dinner.   glucose blood (ONE TOUCH ULTRA TEST) test strip CHECK BLOOD SUGAR UP  TO 2 TIMES A DAY.   hydrocortisone (ANUSOL-HC) 25 MG suppository Place 1 suppository (25 mg total) rectally 2 (two) times daily. For 7 days for hemorrhoid   hydrocortisone (PROCTOZONE-HC) 2.5 % rectal cream Place 1 application rectally 2 (two) times daily. For hemorrhoid   levothyroxine (SYNTHROID) 25 MCG tablet TAKE 1 TABLET BY MOUTH ONCE DAILY ON AN EMPTY STOMACH. WAIT 30 MINUTES BEFORE TAKING OTHER MEDS. (Patient taking differently: Take 25 mcg by mouth daily before breakfast. TAKE 1 TABLET BY MOUTH ONCE DAILY ON AN EMPTY STOMACH. WAIT 30 MINUTES BEFORE TAKING OTHER MEDS.)   lisinopril (ZESTRIL) 5 MG tablet Take 5 mg by mouth daily.   Melatonin 10 MG SUBL Place 10 mg under the tongue at bedtime as needed (sleep).   nitrofurantoin (MACRODANTIN) 100 MG capsule Take 1 capsule (100 mg total) by mouth daily.   omeprazole (PRILOSEC) 20 MG capsule TAKE 1 CAPSULE BY MOUTH ONCE DAILY (Patient taking differently: Take 20 mg by mouth daily. )   ONETOUCH DELICA LANCETS 78G MISC 1 each by Does not apply route 3 (three) times daily. Dx:E11.9   oxyCODONE (OXY IR/ROXICODONE) 5 MG immediate release tablet Take 1  tablet (5 mg total) by mouth every 6 (six) hours as needed for moderate pain, severe pain or breakthrough pain. Max 4 per day.   polyethylene glycol (MIRALAX / GLYCOLAX) 17 g packet Take 17 g by mouth daily as needed for moderate constipation or severe constipation.   senna (SENOKOT) 8.6 MG TABS tablet Take 1 tablet (8.6 mg total) by mouth 2 (two) times daily. (Patient taking differently: Take 1 tablet by mouth 2 (two) times daily as needed for mild constipation or moderate constipation. )   simvastatin (ZOCOR) 40 MG tablet TAKE 1 TABLET BY MOUTH AT BEDTIME (Patient taking differently: Take 40 mg by mouth at bedtime. )   baclofen (LIORESAL) 10 MG tablet Take 0.5-1 tablets (5-10 mg total) by mouth 3 (three) times daily as needed for muscle spasms. (Patient not taking: Reported on 08/31/2019)   ciprofloxacin (CIPRO) 500 MG tablet Take 1 tablet (500 mg total) by mouth every 12 (twelve) hours. (Patient not taking: Reported on 09/07/2019)   cyclobenzaprine (FLEXERIL) 5 MG tablet Take 1 tablet (5 mg total) by mouth 3 (three) times daily. (Patient not taking: Reported on 08/31/2019)   ferrous sulfate 325 (65 FE) MG tablet Take 325 mg by mouth daily. (Patient not taking: Reported on 09/07/2019)   gabapentin (NEURONTIN) 250 MG/5ML solution Take 2 mLs (100 mg total) by mouth at bedtime. Every 3-5 days if needed, may increase by 2 additional mL (dose of 55mL or $Remov'200mg'kFWdKK$ , then up to max dose 87mL or $Remov'300mg'eQWiYD$ ) (Patient not taking: Reported on 09/07/2019)   Plecanatide (TRULANCE) 3 MG TABS Take 3 mg by mouth daily. (Patient not taking: Reported on 09/07/2019)   [DISCONTINUED] senna-docusate (SENOKOT-S) 8.6-50 MG tablet Take 1 tablet by mouth 2 (two) times daily.   No facility-administered encounter medications on file as of 09/07/2019.    PHYSICAL EXAM / ROS:   Current and past weights: 113 reported General: NAD, frail appearing, thin Cardiovascular: no chest pain reported, no edema  Pulmonary: no cough, no  increased SOB, room air Abdomen: appetite fair, endorses constipation, continent of bowel GU: denies dysuria, continent of urine, Rx for UTI MSK:  no joint and ROM abnormalities, ambulatory with walker Skin: no rashes or wounds reported Neurological: Weakness, reports LE pain, restless leg, insomnia  Jason Coop, NP , [place], MPH, [facility]  COVID-19  PATIENT SCREENING TOOL  Person answering questions: ____________self_____ _____   1.  Is the patient or any family member in the home showing any signs or symptoms regarding respiratory infection?               Person with Symptom- __________NA_________________  a. Fever                                                                          Yes___ No___          ___________________  b. Shortness of breath                                                    Yes___ No___          ___________________ c. Cough/congestion                                       Yes___  No___         ___________________ d. Body aches/pains                                                         Yes___ No___        ____________________ e. Gastrointestinal symptoms (diarrhea, nausea)           Yes___ No___        ____________________  2. Within the past 14 days, has anyone living in the home had any contact with someone with or under investigation for COVID-19?    Yes___ No_X_   Person __________________

## 2019-09-07 NOTE — Chronic Care Management (AMB) (Signed)
Chronic Care Management   Initial Visit Note  09/07/2019 Name: ZURIA FOSDICK MRN: [id number] DOB: 1929-07-14  Referred by: Olin Hauser, DO Reason for referral : Chronic Care Management (RNCM Initial Outreach for Chronic Disease Management and Care Coordination Needs)   MEDORA ROORDA is a 86 y.o. year old female who is a primary care patient of Olin Hauser, DO. The CCM team was consulted for assistance with chronic disease management and care coordination needs related to HTN, HLD, DMII, CKD Stage 3, Anxiety and Hypothyroidism  Review of patient status, including review of consultants reports, relevant laboratory and other test results, and collaboration with appropriate care team members and the patient's provider was performed as part of comprehensive patient evaluation and provision of chronic care management services.    SDOH (Social Determinants of Health) assessments performed: Yes See Care Plan activities for detailed interventions related to SDOH  SDOH Interventions     Most Recent Value  SDOH Interventions  Physical Activity Interventions Other (Comments)  [the patient is currently working wtih PT in the home. She is ambulating with a walker]  Stress Interventions Other (Comment)  [the patient has a good support system, she is working with pcp and ccm team also]  Social Connections Interventions Other (Comment)  [has a good support system]       Medications: Outpatient Encounter Medications as of 09/07/2019  Medication Sig Note   baclofen (LIORESAL) 10 MG tablet Take 0.5-1 tablets (5-10 mg total) by mouth 3 (three) times daily as needed for muscle spasms. (Patient not taking: Reported on 08/31/2019)    Blood Glucose Monitoring Suppl ([place]) w/Device KIT Use glucometer to check blood sugar daily.    cholecalciferol (VITAMIN D) 1000 units tablet Take 2,000 Units by mouth daily.     ciprofloxacin (CIPRO) 500 MG tablet Take 1 tablet  (500 mg total) by mouth every 12 (twelve) hours. (Patient not taking: Reported on 09/07/2019)    cyclobenzaprine (FLEXERIL) 5 MG tablet Take 1 tablet (5 mg total) by mouth 3 (three) times daily. (Patient not taking: Reported on 08/31/2019)    escitalopram (LEXAPRO) 5 MG tablet TAKE 1 TABLET BY MOUTH ONCE DAILY WITH FOOD (Patient taking differently: Take 5 mg by mouth daily. )    feeding supplement, ENSURE ENLIVE, (ENSURE ENLIVE) LIQD Take 237 mLs by mouth 3 (three) times daily.    ferrous sulfate 325 (65 FE) MG tablet Take 325 mg by mouth daily. (Patient not taking: Reported on 09/07/2019)    gabapentin (NEURONTIN) 100 MG capsule Take 100 mg by mouth at bedtime. May increase to 200 mg nightly if needed and no dizziness.    gabapentin (NEURONTIN) 250 MG/5ML solution Take 2 mLs (100 mg total) by mouth at bedtime. Every 3-5 days if needed, may increase by 2 additional mL (dose of 60m or 2052m then up to max dose 1m10mr 300m71mPatient not taking: Reported on 09/07/2019)    glimepiride (AMARYL) 2 MG tablet Take 1 tablet (2 mg total) by mouth daily. With meal, either breakfast or dinner.    glucose blood (ONE TOUCH ULTRA TEST) test strip CHECK BLOOD SUGAR UP TO 2 TIMES A DAY.    hydrocortisone (ANUSOL-HC) 25 MG suppository Place 1 suppository (25 mg total) rectally 2 (two) times daily. For 7 days for hemorrhoid    hydrocortisone (PROCTOZONE-HC) 2.5 % rectal cream Place 1 application rectally 2 (two) times daily. For hemorrhoid    levothyroxine (SYNTHROID) 25 MCG tablet TAKE 1 TABLET  BY MOUTH ONCE DAILY ON AN EMPTY STOMACH. WAIT 30 MINUTES BEFORE TAKING OTHER MEDS. (Patient taking differently: Take 25 mcg by mouth daily before breakfast. TAKE 1 TABLET BY MOUTH ONCE DAILY ON AN EMPTY STOMACH. WAIT 30 MINUTES BEFORE TAKING OTHER MEDS.)    lisinopril (ZESTRIL) 5 MG tablet Take 5 mg by mouth daily.    Melatonin 10 MG SUBL Place 10 mg under the tongue at bedtime as needed (sleep).    nitrofurantoin  (MACRODANTIN) 100 MG capsule Take 1 capsule (100 mg total) by mouth daily.    omeprazole (PRILOSEC) 20 MG capsule TAKE 1 CAPSULE BY MOUTH ONCE DAILY (Patient taking differently: Take 20 mg by mouth daily. )    ONETOUCH DELICA LANCETS 16X MISC 1 each by Does not apply route 3 (three) times daily. Dx:E11.9    oxyCODONE (OXY IR/ROXICODONE) 5 MG immediate release tablet Take 1 tablet (5 mg total) by mouth every 6 (six) hours as needed for moderate pain, severe pain or breakthrough pain. Max 4 per day.    Plecanatide (TRULANCE) 3 MG TABS Take 3 mg by mouth daily. (Patient not taking: Reported on 09/07/2019)    polyethylene glycol (MIRALAX / GLYCOLAX) 17 g packet Take 17 g by mouth daily as needed for moderate constipation or severe constipation.    senna (SENOKOT) 8.6 MG TABS tablet Take 1 tablet (8.6 mg total) by mouth 2 (two) times daily. (Patient taking differently: Take 1 tablet by mouth 2 (two) times daily as needed for mild constipation or moderate constipation. )    simvastatin (ZOCOR) 40 MG tablet TAKE 1 TABLET BY MOUTH AT BEDTIME (Patient taking differently: Take 40 mg by mouth at bedtime. ) 07/04/2019: May interact with patients prednisone   No facility-administered encounter medications on file as of 09/07/2019.     Objective:  BP Readings from Last 3 Encounters:  08/15/19 (!) 118/62  08/14/19 (!) 167/75  08/07/19 133/63   Lab Results  Component Value Date   HGBA1C 6.6 (H) 09/06/2019    Goals Addressed              This Visit's Progress     RNCM: Pt- "I need a new body" (pt-stated)        CARE PLAN ENTRY (see longtitudinal plan of care for additional care plan information)  Current Barriers:   Chronic Disease Management support, education, and care coordination needs related to HTN, HLD, DMII, CKD Stage 3, Anxiety, and hypothyroidism  Clinical Goal(s) related to HTN, HLD, DMII, CKD Stage 3, Anxiety, and Hypothyroidism:  Over the next 120 days, patient will:   Work  with the care management team to address educational, disease management, and care coordination needs   Begin or continue self health monitoring activities as directed today Measure and record cbg (blood glucose) 1/2 times a week, Measure and record blood pressure 2 times per week, and adhere to a heart healthy/ADA diet  Call provider office for new or worsened signs and symptoms Blood glucose findings outside established parameters, Blood pressure findings outside established parameters, Shortness of breath, and New or worsened symptom related to HLD/Anxiety/CKD/Hypothyroidism   Call care management team with questions or concerns  Verbalize basic understanding of patient centered plan of care established today  Interventions related to HTN, HLD, DMII, CKD Stage 3, Anxiety, and hypothyroidism :   Evaluation of current treatment plans and patient's adherence to plan as established by provider. The patient has a good understanding of her plan of care. She is thankful for  the help in managing her care. The patient is currently dealing with constipation and hemorrhoids but states her symptoms are better since she saw the pcp.  She has had a  hard time over the last couple of months due to her fall and sacral fracture.   Assessed patient understanding of disease states.  The patient understands her chronic conditions.  She lives alone but has help from her son. He checks on her daily. She lost her husband in April and is mourning the loss of her husband. Empathetic listening and support given. She has 2 step daughters that live in [place]. They all came and celebrated her birthday this week. This made the patient happy.   Assessed patient's education and care coordination needs.  The patient does have a need for transportation as her son is working a lot of overtime; however she does have some family and friends that can help with appointments. Offered a care guide referral for the patient but she  verbalized  until she was walking better she did not want to go on a bus or arranged transportation where they would drop her off and come back and get her. Eduction on the care guide team available to help as needed. The patient verbalized understanding.   Provided disease specific education to patient.  Education on taking medications as directed, the patient verbalized compliance. Education on heatlhy/ADA diet. The patient has been losing weight. Discussed supplements. The patient comes back to see pcp next week for follow up.   Collaborated with appropriate clinical care team members regarding patient needs. Contact information provided to the patient for the [facility].   LCSW and Pharm D are currently working with the patient. The patient is thankful for the team working with her and she is very happy with her pcp.  Evaluation of upcoming appointments: Sees pcp on 09-13-2019 at 2 pm  Patient Self Care Activities related to HTN, HLD, DMII, CKD Stage 3, Anxiety, and hypothyroidism :   Patient is unable to independently self-manage chronic health conditions  Initial goal documentation       RNCM: Pt: "I am walking with a walker"        [place] (see longitudinal plan of care for additional care plan information)  Current Barriers:   Knowledge Deficits related to fall precautions in patient with fall x 3 plus months ago with hospitalization (sacral fracture) and other chronic conditions.   Decreased adherence to prescribed treatment for fall prevention  Care Coordination needs related to possible need for transportation/in home services in a patient with HTN/CKD3/Dm2/Hypothyroidism and Anxiety (disease states)  Lacks caregiver support. Son is very supportive but works at [employer] and is not always able to take her to appointments. Has 2 step-daughters that live in [place]- has to depend on family and friends for transporation   Clinical Goal(s):   Over the  next 120 days, patient will demonstrate improved adherence to prescribed treatment plan for decreasing falls as evidenced by patient reporting and review of EMR  Over the next 120 days, patient will verbalize using fall risk reduction strategies discussed  Over the next 120 days, patient will not experience additional falls   Interventions:   Provided written and verbal education re: Potential causes of falls and Fall prevention strategies  Reviewed medications and discussed potential side effects of medications such as dizziness and frequent urination  Assessed for s/s of orthostatic hypotension  Assessed for falls since last encounter. Has not had  any new falls at this time  Assessed patients knowledge of fall risk prevention secondary to previously provided education.  Assessed working status of life alert bracelet and patient adherence  Provided patient information for fall alert systems  Discussed plans with patient for ongoing care management follow up and provided patient with direct contact information for care management team  Patient Self Care Activities:   Utilize walker (assistive device) appropriately with all ambulation  De-clutter walkways  Change positions slowly  Wear secure fitting shoes at all times with ambulation  Utilize home lighting for dim lit areas  Have self and pet awareness at all times  Working with PT/OT and nursing in the home for strengthening and support  Plan:  CCM RN CM will follow up in 14 days   Initial goal documentation         Ms. Neary was given information about Chronic Care Management services today including:  1. CCM service includes personalized support from designated clinical staff supervised by her physician, including individualized plan of care and coordination with other care providers 2. 24/7 contact phone numbers for assistance for urgent and routine care needs. 3. Service will only be billed when office  clinical staff spend 20 minutes or more in a month to coordinate care. 4. Only one practitioner may furnish and bill the service in a calendar month. 5. The patient may stop CCM services at any time (effective at the end of the month) by phone call to the office staff. 6. The patient will be responsible for cost sharing (co-pay) of up to 20% of the service fee (after annual deductible is met).  Patient agreed to services and verbal consent obtained.   Plan:   Telephone follow up appointment with care management team member scheduled for: 09-21-2019 at 1 pm  Noreene Larsson RN, MSN, [facility] Mobile: [phone]

## 2019-09-07 NOTE — Patient Instructions (Signed)
Visit Information  Goals Addressed              This Visit's Progress     RNCM: Pt- "I need a new body" (pt-stated)        CARE PLAN ENTRY (see longtitudinal plan of care for additional care plan information)  Current Barriers:   Chronic Disease Management support, education, and care coordination needs related to HTN, HLD, DMII, CKD Stage 3, Anxiety, and hypothyroidism  Clinical Goal(s) related to HTN, HLD, DMII, CKD Stage 3, Anxiety, and Hypothyroidism:  Over the next 120 days, patient will:   Work with the care management team to address educational, disease management, and care coordination needs   Begin or continue self health monitoring activities as directed today Measure and record cbg (blood glucose) 1/2 times a week, Measure and record blood pressure 2 times per week, and adhere to a heart healthy/ADA diet  Call provider office for new or worsened signs and symptoms Blood glucose findings outside established parameters, Blood pressure findings outside established parameters, Shortness of breath, and New or worsened symptom related to HLD/Anxiety/CKD/Hypothyroidism   Call care management team with questions or concerns  Verbalize basic understanding of patient centered plan of care established today  Interventions related to HTN, HLD, DMII, CKD Stage 3, Anxiety, and hypothyroidism :   Evaluation of current treatment plans and patient's adherence to plan as established by provider. The patient has a good understanding of her plan of care. She is thankful for the help in managing her care. The patient is currently dealing with constipation and hemorrhoids but states her symptoms are better since she saw the pcp.  She has had a  hard time over the last couple of months due to her fall and sacral fracture.   Assessed patient understanding of disease states.  The patient understands her chronic conditions.  She lives alone but has help from her son. He checks on her daily. She  lost her husband in April and is mourning the loss of her husband. Empathetic listening and support given. She has 2 step daughters that live in [place]. They all came and celebrated her birthday this week. This made the patient happy.   Assessed patient's education and care coordination needs.  The patient does have a need for transportation as her son is working a lot of overtime; however she does have some family and friends that can help with appointments. Offered a care guide referral for the patient but she verbalized  until she was walking better she did not want to go on a bus or arranged transportation where they would drop her off and come back and get her. Eduction on the care guide team available to help as needed. The patient verbalized understanding.   Provided disease specific education to patient.  Education on taking medications as directed, the patient verbalized compliance. Education on heatlhy/ADA diet. The patient has been losing weight. Discussed supplements. The patient comes back to see pcp next week for follow up.   Collaborated with appropriate clinical care team members regarding patient needs. Contact information provided to the patient for the [facility].   LCSW and Pharm D are currently working with the patient. The patient is thankful for the team working with her and she is very happy with her pcp.  Evaluation of upcoming appointments: Sees pcp on 09-13-2019 at 2 pm  Patient Self Care Activities related to HTN, HLD, DMII, CKD Stage 3, Anxiety, and hypothyroidism :   Patient is unable  to independently self-manage chronic health conditions  Initial goal documentation       RNCM: Pt: "I am walking with a walker"        [place] (see longitudinal plan of care for additional care plan information)  Current Barriers:   Knowledge Deficits related to fall precautions in patient with fall x 3 plus months ago with hospitalization (sacral fracture) and other chronic  conditions.   Decreased adherence to prescribed treatment for fall prevention  Care Coordination needs related to possible need for transportation/in home services in a patient with HTN/CKD3/Dm2/Hypothyroidism and Anxiety (disease states)  Lacks caregiver support. Son is very supportive but works at [employer] and is not always able to take her to appointments. Has 2 step-daughters that live in [place] barriers- has to depend on family and friends for transporation   Clinical Goal(s):   Over the next 120 days, patient will demonstrate improved adherence to prescribed treatment plan for decreasing falls as evidenced by patient reporting and review of EMR  Over the next 120 days, patient will verbalize using fall risk reduction strategies discussed  Over the next 120 days, patient will not experience additional falls   Interventions:   Provided written and verbal education re: Potential causes of falls and Fall prevention strategies  Reviewed medications and discussed potential side effects of medications such as dizziness and frequent urination  Assessed for s/s of orthostatic hypotension  Assessed for falls since last encounter. Has not had any new falls at this time  Assessed patients knowledge of fall risk prevention secondary to previously provided education.  Assessed working status of life alert bracelet and patient adherence  Provided patient information for fall alert systems  Discussed plans with patient for ongoing care management follow up and provided patient with direct contact information for care management team  Patient Self Care Activities:   Utilize walker (assistive device) appropriately with all ambulation  De-clutter walkways  Change positions slowly  Wear secure fitting shoes at all times with ambulation  Utilize home lighting for dim lit areas  Have self and pet awareness at all times  Working with PT/OT and nursing in the home for  strengthening and support  Plan:  CCM RN CM will follow up in 14 days   Initial goal documentation        Ms. Apel was given information about Chronic Care Management services today including:  1. CCM service includes personalized support from designated clinical staff supervised by her physician, including individualized plan of care and coordination with other care providers 2. 24/7 contact phone numbers for assistance for urgent and routine care needs. 3. Service will only be billed when office clinical staff spend 20 minutes or more in a month to coordinate care. 4. Only one practitioner may furnish and bill the service in a calendar month. 5. The patient may stop CCM services at any time (effective at the end of the month) by phone call to the office staff. 6. The patient will be responsible for cost sharing (co-pay) of up to 20% of the service fee (after annual deductible is met).  Patient agreed to services and verbal consent obtained.   Patient verbalizes understanding of instructions provided today.   Telephone follow up appointment with care management team member scheduled for: 09-21-2019 at 1 pm  Noreene Larsson RN, MSN, [facility] Mobile: [phone]   Understanding Your Risk for Falls  Each year, millions of people have serious injuries from falls. It is important to understand your risk for falling. Talk with your health care provider about your risk and what you can do to lower it. There are actions you can take at home to lower your risk. If you do have a serious fall, make sure you tell your health care provider. Falling once raises your risk for falling again. How can falls affect me? Serious injuries from falls are common. These include:  Broken bones, such as hip fractures.  Head injuries, such as traumatic brain injuries (TBI). Fear of falling can also cause you to avoid  activities and stay at home. This can make your muscles weaker and actually raise your risk for a fall. What can increase my risk? There are a number of risk factors that increase your risk for falling. The more risk factors you have, the higher your risk for falling. Serious injuries from a fall most often happen to people older than age 81. Children and young adults ages 65-29 are also at higher risk. Common risk factors include:  Weakness in the lower body.  Lack (deficiency) of vitamin D.  Being generally weak or confused due to long-term (chronic) illness.  Dizziness or balance problems.  Poor vision.  Medicines that cause dizziness or drowsiness. These can include medicines for your blood pressure, heart, anxiety, insomnia, or edema, as well as pain medicines and muscle relaxants. Other risk factors include:  Drinking alcohol.  Having had a fall in the past.  Having depression.  Foot pain or improper footwear.  Working at a dangerous job.  Having any of the following in your home: ? Tripping hazards, such as floor clutter or loose rugs. ? Poor lighting. ? Pets or clutter.  Dementia or memory loss. What actions can I take to lower my risk of falling?     Physical activity Maintain physical fitness. Do strength and balance exercises. Consider taking a regular class to build strength and balance. Yoga and tai chi are good options. Vision Have your eyes checked every year and your vision prescription updated as needed. Walking aids and footwear  Wear nonskid shoes. Do not wear high heels.  Do not walk around the house in socks or slippers.  Use a cane or walker as told by your health care provider. Home safety  Attach secure railings on both sides of your stairs.  Install grab bars for your tub, shower, and toilet. Use a bath mat in your tub or shower.  Use good lighting in all rooms. Keep a flashlight near your bed.  Make sure there is a clear path from  your bed to the bathroom. Use night-lights.  Do not use throw rugs. Make sure all carpeting is taped or tacked down securely.  Remove all clutter from walkways and stairways, including extension cords.  Repair uneven or broken steps.  Avoid walking on icy or slippery surfaces. Walk on the grass instead of on icy or slick sidewalks. Where you can, use ice melt to get rid of ice on walkways.  Use a cordless phone. Questions to ask your health care provider  Can you help me check my risk for a fall?  Do any of my medicines make me more likely to fall?  Should I take a vitamin D supplement?  What exercises can I do to improve my strength and balance?  Should I make an appointment to have my vision checked?  Do I need a bone density  test to check for weak bones or osteoporosis?  Would it help to use a cane or a walker? Where to find more information  Centers for Disease Control and Prevention, STEADI: http://www.wolf.info/  Community-Based Fall Prevention Programs: http://www.wolf.info/  National Institute on Aging: ToneConnect.com.ee Contact a health care provider if:  You fall at home.  You are afraid of falling at home.  You feel weak, drowsy, or dizzy. Summary  People 42 and older are at high risk for falling. However, older people are not the only ones injured in falls. Children and young adults have a higher-than-normal risk too.  Talk with your health care provider about your risks for falling and how to lower those risks.  Taking certain precautions at home can lower your risk for falling.  If you fall, always tell your health care provider. This information is not intended to replace advice given to you by your health care provider. Make sure you discuss any questions you have with your health care provider. Document Revised: 07/13/2018 Document Reviewed: 07/13/2018 Elsevier Patient Education  [employer]. It is important to avoid accidents which may result in broken  bones.  Here are a few ideas on how to make your home safer so you will be less likely to trip or fall.  1. Use nonskid mats or non slip strips in your shower or tub, on your bathroom floor and around sinks.  If you know that you have spilled water, wipe it up! 2. In the bathroom, it is important to have properly installed grab bars on the walls or on the edge of the tub.  Towel racks are NOT strong enough for you to hold onto or to pull on for support. 3. Stairs and hallways should have enough light.  Add lamps or night lights if you need ore light. 4. It is good to have handrails on both sides of the stairs if possible.  Always fix broken handrails right away. 5. It is important to see the edges of steps.  Paint the edges of outdoor steps white so you can see them better.  Put colored tape on the edge of inside steps. 6. Throw-rugs are dangerous because they can slide.  Removing the rugs is the best idea, but if they must stay, add adhesive carpet tape to prevent slipping. 7. Do not keep things on stairs or in the halls.  Remove small furniture that blocks the halls as it may cause you to trip.  Keep telephone and electrical cords out of the way where you walk. 8. Always were sturdy, rubber-soled shoes for good support.  Never wear just socks, especially on the stairs.  Socks may cause you to slip or fall.  Do not wear full-length housecoats as you can easily trip on the bottom.  9. Place the things you use the most on the shelves that are the easiest to reach.  If you use a stepstool, make sure it is in good condition.  If you feel unsteady, DO NOT climb, ask for help. 10. If a health professional advises you to use a cane or walker, do not be ashamed.  These items can keep you from falling and breaking your bones. Carbohydrate Counting for Diabetes Mellitus, Adult  Carbohydrate counting is a method of keeping track of how many carbohydrates you eat. Eating carbohydrates naturally increases the  amount of sugar (glucose) in the blood. Counting how many carbohydrates you eat helps keep your blood glucose within normal limits, which helps you  manage your diabetes (diabetes mellitus). It is important to know how many carbohydrates you can safely have in each meal. This is different for every person. A diet and nutrition specialist (registered dietitian) can help you make a meal plan and calculate how many carbohydrates you should have at each meal and snack. Carbohydrates are found in the following foods:  Grains, such as breads and cereals.  Dried beans and soy products.  Starchy vegetables, such as potatoes, peas, and corn.  Fruit and fruit juices.  Milk and yogurt.  Sweets and snack foods, such as cake, cookies, candy, chips, and soft drinks. How do I count carbohydrates? There are two ways to count carbohydrates in food. You can use either of the methods or a combination of both. Reading "Nutrition Facts" on packaged food The "Nutrition Facts" list is included on the labels of almost all packaged foods and beverages in the [place] It includes:  The serving size.  Information about nutrients in each serving, including the grams (g) of carbohydrate per serving. To use the Nutrition Facts":  Decide how many servings you will have.  Multiply the number of servings by the number of carbohydrates per serving.  The resulting number is the total amount of carbohydrates that you will be having. Learning standard serving sizes of other foods When you eat carbohydrate foods that are not packaged or do not include "Nutrition Facts" on the label, you need to measure the servings in order to count the amount of carbohydrates:  Measure the foods that you will eat with a food scale or measuring cup, if needed.  Decide how many standard-size servings you will eat.  Multiply the number of servings by 15. Most carbohydrate-rich foods have about 15 g of carbohydrates per serving. ? For  example, if you eat 8 oz (170 g) of strawberries, you will have eaten 2 servings and 30 g of carbohydrates (2 servings x 15 g = 30 g).  For foods that have more than one food mixed, such as soups and casseroles, you must count the carbohydrates in each food that is included. The following list contains standard serving sizes of common carbohydrate-rich foods. Each of these servings has about 15 g of carbohydrates:   hamburger bun or  English muffin.   oz (15 mL) syrup.   oz (14 g) jelly.  1 slice of bread.  1 six-inch tortilla.  3 oz (85 g) cooked rice or pasta.  4 oz (113 g) cooked dried beans.  4 oz (113 g) starchy vegetable, such as peas, corn, or potatoes.  4 oz (113 g) hot cereal.  4 oz (113 g) mashed potatoes or  of a large baked potato.  4 oz (113 g) canned or frozen fruit.  4 oz (120 mL) fruit juice.  4-6 crackers.  6 chicken nuggets.  6 oz (170 g) unsweetened dry cereal.  6 oz (170 g) plain fat-free yogurt or yogurt sweetened with artificial sweeteners.  8 oz (240 mL) milk.  8 oz (170 g) fresh fruit or one small piece of fruit.  24 oz (680 g) popped popcorn. Example of carbohydrate counting Sample meal  3 oz (85 g) chicken breast.  6 oz (170 g) brown rice.  4 oz (113 g) corn.  8 oz (240 mL) milk.  8 oz (170 g) strawberries with sugar-free whipped topping. Carbohydrate calculation 1. Identify the foods that contain carbohydrates: ? Rice. ? Corn. ? Milk. ? Strawberries. 2. Calculate how many servings you have of each  food: ? 2 servings rice. ? 1 serving corn. ? 1 serving milk. ? 1 serving strawberries. 3. Multiply each number of servings by 15 g: ? 2 servings rice x 15 g = 30 g. ? 1 serving corn x 15 g = 15 g. ? 1 serving milk x 15 g = 15 g. ? 1 serving strawberries x 15 g = 15 g. 4. Add together all of the amounts to find the total grams of carbohydrates eaten: ? 30 g + 15 g + 15 g + 15 g = 75 g of carbohydrates  total. Summary  Carbohydrate counting is a method of keeping track of how many carbohydrates you eat.  Eating carbohydrates naturally increases the amount of sugar (glucose) in the blood.  Counting how many carbohydrates you eat helps keep your blood glucose within normal limits, which helps you manage your diabetes.  A diet and nutrition specialist (registered dietitian) can help you make a meal plan and calculate how many carbohydrates you should have at each meal and snack. This information is not intended to replace advice given to you by your health care provider. Make sure you discuss any questions you have with your health care provider. Document Revised: 07/30/2016 Document Reviewed: 06/19/2015 Elsevier Patient Education  [employer].

## 2019-09-11 ENCOUNTER — Ambulatory Visit: Payer: PPO | Admitting: Pharmacist

## 2019-09-11 ENCOUNTER — Other Ambulatory Visit: Payer: Self-pay | Admitting: Family Medicine

## 2019-09-11 ENCOUNTER — Other Ambulatory Visit: Payer: Self-pay | Admitting: Student

## 2019-09-11 DIAGNOSIS — G2581 Restless legs syndrome: Secondary | ICD-10-CM

## 2019-09-11 DIAGNOSIS — K5909 Other constipation: Secondary | ICD-10-CM | POA: Diagnosis not present

## 2019-09-11 DIAGNOSIS — R6881 Early satiety: Secondary | ICD-10-CM | POA: Diagnosis not present

## 2019-09-11 DIAGNOSIS — K644 Residual hemorrhoidal skin tags: Secondary | ICD-10-CM

## 2019-09-11 DIAGNOSIS — K5900 Constipation, unspecified: Secondary | ICD-10-CM | POA: Diagnosis not present

## 2019-09-11 DIAGNOSIS — R634 Abnormal weight loss: Secondary | ICD-10-CM

## 2019-09-11 DIAGNOSIS — R1310 Dysphagia, unspecified: Secondary | ICD-10-CM | POA: Diagnosis not present

## 2019-09-11 DIAGNOSIS — M160 Bilateral primary osteoarthritis of hip: Secondary | ICD-10-CM | POA: Diagnosis not present

## 2019-09-11 DIAGNOSIS — K219 Gastro-esophageal reflux disease without esophagitis: Secondary | ICD-10-CM

## 2019-09-11 DIAGNOSIS — K449 Diaphragmatic hernia without obstruction or gangrene: Secondary | ICD-10-CM | POA: Diagnosis not present

## 2019-09-11 DIAGNOSIS — R1319 Other dysphagia: Secondary | ICD-10-CM

## 2019-09-11 DIAGNOSIS — K649 Unspecified hemorrhoids: Secondary | ICD-10-CM | POA: Diagnosis not present

## 2019-09-11 DIAGNOSIS — E1121 Type 2 diabetes mellitus with diabetic nephropathy: Secondary | ICD-10-CM

## 2019-09-11 NOTE — Chronic Care Management (AMB) (Signed)
Chronic Care Management   Follow Up Note   09/11/2019 Name: Lisa Mills MRN: [id number] DOB: 01-May-1929  Referred by: Olin Hauser, DO Reason for referral : Chronic Care Management (Patient Phone Call)   Lisa Mills is a 86 y.o. year old female who is a primary care patient of Olin Hauser, DO. The CCM team was consulted for assistance with chronic disease management and care coordination needs.    Receive a coordination of care secure chat message from Estevan Oaks, NP with [place].  I reached out to Raenette Rover by phone today.   Review of patient status, including review of consultants reports, relevant laboratory and other test results, and collaboration with appropriate care team members and the patient's provider was performed as part of comprehensive patient evaluation and provision of chronic care management services.    SDOH (Social Determinants of Health) assessments performed: No See Care Plan activities for detailed interventions related to [facility])     Outpatient Encounter Medications as of 09/11/2019  Medication Sig Note  . baclofen (LIORESAL) 10 MG tablet Take 0.5-1 tablets (5-10 mg total) by mouth 3 (three) times daily as needed for muscle spasms. (Patient not taking: Reported on 08/31/2019)   . Blood Glucose Monitoring Suppl ([place]) w/Device KIT Use glucometer to check blood sugar daily.   . cholecalciferol (VITAMIN D) 1000 units tablet Take 2,000 Units by mouth daily.    . ciprofloxacin (CIPRO) 500 MG tablet Take 1 tablet (500 mg total) by mouth every 12 (twelve) hours. (Patient not taking: Reported on 09/07/2019)   . cyclobenzaprine (FLEXERIL) 5 MG tablet Take 1 tablet (5 mg total) by mouth 3 (three) times daily. (Patient not taking: Reported on 08/31/2019)   . escitalopram (LEXAPRO) 5 MG tablet TAKE 1 TABLET BY MOUTH ONCE DAILY WITH FOOD (Patient taking differently: Take 5 mg by mouth daily. )   .  feeding supplement, ENSURE ENLIVE, (ENSURE ENLIVE) LIQD Take 237 mLs by mouth 3 (three) times daily.   . ferrous sulfate 325 (65 FE) MG tablet Take 325 mg by mouth daily. (Patient not taking: Reported on 09/07/2019)   . gabapentin (NEURONTIN) 100 MG capsule Take 100 mg by mouth at bedtime. May increase to 200 mg nightly if needed and no dizziness.   . gabapentin (NEURONTIN) 250 MG/5ML solution Take 2 mLs (100 mg total) by mouth at bedtime. Every 3-5 days if needed, may increase by 2 additional mL (dose of 52m or 2021m then up to max dose 52m35mr 300m20mPatient not taking: Reported on 09/07/2019)   . glimepiride (AMARYL) 2 MG tablet Take 1 tablet (2 mg total) by mouth daily. With meal, either breakfast or dinner.   . glMarland Kitchencose blood (ONE TOUCH ULTRA TEST) test strip CHECK BLOOD SUGAR UP TO 2 TIMES A DAY.   . hydrocortisone (ANUSOL-HC) 25 MG suppository Place 1 suppository (25 mg total) rectally 2 (two) times daily. For 7 days for hemorrhoid   . hydrocortisone (PROCTOZONE-HC) 2.5 % rectal cream Place 1 application rectally 2 (two) times daily. For hemorrhoid   . levothyroxine (SYNTHROID) 25 MCG tablet TAKE 1 TABLET BY MOUTH ONCE DAILY ON AN EMPTY STOMACH. WAIT 30 MINUTES BEFORE TAKING OTHER MEDS. (Patient taking differently: Take 25 mcg by mouth daily before breakfast. TAKE 1 TABLET BY MOUTH ONCE DAILY ON AN EMPTY STOMACH. WAIT 30 MINUTES BEFORE TAKING OTHER MEDS.)   . lisinopril (ZESTRIL) 5 MG tablet Take 5 mg by mouth daily.   .Marland Kitchen  Melatonin 10 MG SUBL Place 10 mg under the tongue at bedtime as needed (sleep).   . nitrofurantoin (MACRODANTIN) 100 MG capsule Take 1 capsule (100 mg total) by mouth daily.   Marland Kitchen omeprazole (PRILOSEC) 20 MG capsule TAKE 1 CAPSULE BY MOUTH ONCE DAILY (Patient taking differently: Take 20 mg by mouth daily. )   . ONETOUCH DELICA LANCETS 37S MISC 1 each by Does not apply route 3 (three) times daily. Dx:E11.9   . oxyCODONE (OXY IR/ROXICODONE) 5 MG immediate release tablet Take 1 tablet  (5 mg total) by mouth every 6 (six) hours as needed for moderate pain, severe pain or breakthrough pain. Max 4 per day.   Marland Kitchen Plecanatide (TRULANCE) 3 MG TABS Take 3 mg by mouth daily. (Patient not taking: Reported on 09/07/2019)   . polyethylene glycol (MIRALAX / GLYCOLAX) 17 g packet Take 17 g by mouth daily as needed for moderate constipation or severe constipation.   . senna (SENOKOT) 8.6 MG TABS tablet Take 1 tablet (8.6 mg total) by mouth 2 (two) times daily. (Patient taking differently: Take 1 tablet by mouth 2 (two) times daily as needed for mild constipation or moderate constipation. )   . simvastatin (ZOCOR) 40 MG tablet TAKE 1 TABLET BY MOUTH AT BEDTIME (Patient taking differently: Take 40 mg by mouth at bedtime. ) 07/04/2019: May interact with patients prednisone   No facility-administered encounter medications on file as of 09/11/2019.    Goals Addressed            This Visit's Progress   . PharmD - Medication Managment       CARE PLAN ENTRY (see longitudinal plan of care for additional care plan information)   Current Barriers:  . Chronic Disease Management support, education, and care coordination needs related to T2DM, CKD, HLD, hypothyroidism, anxiety and constipation  Pharmacist Clinical Goal(s):  Marland Kitchen Over the next 30 days, patient will work with CM Pharmacist to complete medication review and address needs identified  Interventions: . Receive secure message from Estevan Oaks, NP with [facility]. Provider received a message that patient had a "reaction" to gabapentin over the weekend and provider requests my assistance with reaching out to patient today. o From review of chart, note on 8/19, patient requested a switch of her gabapentin from liquid to capsule dosage form. Palliative Care NP prescribed gabapentin 100 mg capsules  with direction: Take 100 mg by mouth at bedtime. May increase to 200 mg nightly if needed and no dizziness. . Follow up with Ms.  Hauter today regarding her gabapentin. Patient states "I don't think that it's agreeing with me." Reports she has been taking gabapentin 1 capsule (100 mg) nightly. Describes feeling a little more weak in mornings since started. Denies dizziness or other symptoms. Denies monitoring her blood sugar. Patient declines to speak with me further, stating that she would prefer to discuss with PCP at upcoming appointment on 8/25. . Will collaborate with Palliative Care NP and PCP. Marland Kitchen Provider and Inter-disciplinary care team collaboration (see longitudinal plan of care)  Patient Self Care Activities:  . Attends all scheduled provider appointments o Next appointment with PCP on 8/25   Initial goal documentation and Please see past updates related to this goal by clicking on the "Past Updates" button in the selected goal         Plan  Telephone follow up appointment with care management team member scheduled for: 9/1  Harlow Asa, PharmD, [facility] Clinical Pharmacist [facility] [phone]

## 2019-09-11 NOTE — Patient Instructions (Signed)
Thank you allowing the Chronic Care Management Team to be a part of your care! It was a pleasure speaking with you today!     CCM (Chronic Care Management) Team    Noreene Larsson RN, MSN, CCM Nurse Care Coordinator  [phone]   Harlow Asa PharmD  Clinical Pharmacist  [phone]   Eula Fried LCSW Clinical Social Worker [phone]  Visit Information  Goals Addressed            This Visit's Progress   . PharmD - Medication Managment       CARE PLAN ENTRY (see longitudinal plan of care for additional care plan information)   Current Barriers:  . Chronic Disease Management support, education, and care coordination needs related to T2DM, CKD, HLD, hypothyroidism, anxiety and constipation  Pharmacist Clinical Goal(s):  Marland Kitchen Over the next 30 days, patient will work with CM Pharmacist to complete medication review and address needs identified  Interventions: . Receive secure message from Estevan Oaks, NP with [facility] Palliative Care. Provider received a message that patient had a "reaction" to gabapentin over the weekend and provider requests my assistance with reaching out to patient today. o From review of chart, note on 8/19, patient requested a switch of her gabapentin from liquid to capsule dosage form. Palliative Care NP prescribed gabapentin 100 mg capsules  with direction: Take 100 mg by mouth at bedtime. May increase to 200 mg nightly if needed and no dizziness. . Follow up with Ms. Cervantez today regarding her gabapentin. Patient states "I don't think that it's agreeing with me." Reports she has been taking gabapentin 1 capsule (100 mg) nightly. Describes feeling a little more weak in mornings since started. Denies dizziness or other symptoms. Denies monitoring her blood sugar. Patient declines to speak with me further, stating that she would prefer to discuss with PCP at upcoming appointment on 8/25. . Will collaborate with Palliative Care NP and  PCP. Marland Kitchen Provider and Inter-disciplinary care team collaboration (see longitudinal plan of care)  Patient Self Care Activities:  . Attends all scheduled provider appointments o Next appointment with PCP on 8/25   Initial goal documentation and Please see past updates related to this goal by clicking on the "Past Updates" button in the selected goal         Patient verbalizes understanding of instructions provided today.   Telephone follow up appointment with care management team member scheduled for:9/1  Harlow Asa, PharmD, [place] [phone]

## 2019-09-13 ENCOUNTER — Encounter: Payer: Self-pay | Admitting: Family Medicine

## 2019-09-13 ENCOUNTER — Ambulatory Visit (INDEPENDENT_AMBULATORY_CARE_PROVIDER_SITE_OTHER): Payer: PPO | Admitting: Family Medicine

## 2019-09-13 ENCOUNTER — Other Ambulatory Visit: Payer: Self-pay

## 2019-09-13 VITALS — BP 141/65 | HR 88 | Temp 98.0°F | Resp 16 | Ht 59.0 in | Wt 110.6 lb

## 2019-09-13 DIAGNOSIS — F329 Major depressive disorder, single episode, unspecified: Secondary | ICD-10-CM | POA: Diagnosis not present

## 2019-09-13 DIAGNOSIS — N183 Chronic kidney disease, stage 3 unspecified: Secondary | ICD-10-CM

## 2019-09-13 DIAGNOSIS — E039 Hypothyroidism, unspecified: Secondary | ICD-10-CM | POA: Diagnosis not present

## 2019-09-13 DIAGNOSIS — S3210XD Unspecified fracture of sacrum, subsequent encounter for fracture with routine healing: Secondary | ICD-10-CM | POA: Diagnosis not present

## 2019-09-13 DIAGNOSIS — F5104 Psychophysiologic insomnia: Secondary | ICD-10-CM

## 2019-09-13 DIAGNOSIS — E1122 Type 2 diabetes mellitus with diabetic chronic kidney disease: Secondary | ICD-10-CM | POA: Diagnosis not present

## 2019-09-13 DIAGNOSIS — G2581 Restless legs syndrome: Secondary | ICD-10-CM | POA: Diagnosis not present

## 2019-09-13 DIAGNOSIS — F418 Other specified anxiety disorders: Secondary | ICD-10-CM

## 2019-09-13 DIAGNOSIS — E1121 Type 2 diabetes mellitus with diabetic nephropathy: Secondary | ICD-10-CM

## 2019-09-13 DIAGNOSIS — I10 Essential (primary) hypertension: Secondary | ICD-10-CM | POA: Diagnosis not present

## 2019-09-13 DIAGNOSIS — K219 Gastro-esophageal reflux disease without esophagitis: Secondary | ICD-10-CM | POA: Diagnosis not present

## 2019-09-13 DIAGNOSIS — I129 Hypertensive chronic kidney disease with stage 1 through stage 4 chronic kidney disease, or unspecified chronic kidney disease: Secondary | ICD-10-CM | POA: Diagnosis not present

## 2019-09-13 DIAGNOSIS — D631 Anemia in chronic kidney disease: Secondary | ICD-10-CM | POA: Diagnosis not present

## 2019-09-13 DIAGNOSIS — E785 Hyperlipidemia, unspecified: Secondary | ICD-10-CM | POA: Diagnosis not present

## 2019-09-13 DIAGNOSIS — Z86010 Personal history of colonic polyps: Secondary | ICD-10-CM | POA: Diagnosis not present

## 2019-09-13 DIAGNOSIS — Z87442 Personal history of urinary calculi: Secondary | ICD-10-CM | POA: Diagnosis not present

## 2019-09-13 DIAGNOSIS — S32509D Unspecified fracture of unspecified pubis, subsequent encounter for fracture with routine healing: Secondary | ICD-10-CM | POA: Diagnosis not present

## 2019-09-13 DIAGNOSIS — K5900 Constipation, unspecified: Secondary | ICD-10-CM | POA: Diagnosis not present

## 2019-09-13 DIAGNOSIS — N189 Chronic kidney disease, unspecified: Secondary | ICD-10-CM | POA: Diagnosis not present

## 2019-09-13 DIAGNOSIS — R339 Retention of urine, unspecified: Secondary | ICD-10-CM | POA: Diagnosis not present

## 2019-09-13 DIAGNOSIS — M1990 Unspecified osteoarthritis, unspecified site: Secondary | ICD-10-CM | POA: Diagnosis not present

## 2019-09-13 DIAGNOSIS — Z9181 History of falling: Secondary | ICD-10-CM | POA: Diagnosis not present

## 2019-09-13 MED ORDER — MIRTAZAPINE 15 MG PO TABS: 15.0000 mg | ORAL_TABLET | Freq: Every day | ORAL | 2 refills | Status: DC

## 2019-09-13 MED ORDER — PRAMIPEXOLE DIHYDROCHLORIDE 0.25 MG PO TABS: 0.2500 mg | ORAL_TABLET | Freq: Every day | ORAL | 1 refills | Status: DC

## 2019-09-13 NOTE — Progress Notes (Signed)
Subjective:    Patient ID: Lisa Mills, female    DOB: [birth year], 86 y.o.   MRN: [id number]  Lisa Mills is a 86 y.o. female presenting on 09/13/2019 for Diabetes   HPI   Carea team - [facility] Ralene Bathe NP - Chronic Care Management Team  Followed by [facility] Barium swallow on 09/29/19 Has heartburn Constipation improved. She was on iron in past.  Hemorrhoids, bleeding internal / external Opoid Induced Constipation Sacral fracture, s/p repair Off Opiates now  Anxiety with Mood/Depression recurren Insomnia On escitalopram Ineffective, took Gabapentin 100mg  nightly and then took a 2nd dose made her sick. She stopped taking  Shaking / Tremors Type 2 Diabetes Admits weaker and shaking worse if standing She was having regular headaches over past 2 weeks. Poor sleeping. - She was taking Glimepiride half pill for 2mg  daily - She feels weak at times. She had Hypoglycemia 74, and avg 90s  Asks about Handicap placard  Depression screen [facility] 2/9 09/14/2019 09/07/2019 03/15/2019  Decreased Interest 0 0 0  Down, Depressed, Hopeless 0 0 0  PHQ - 2 Score 0 0 0  Altered sleeping 3 - 0  Tired, decreased energy 1 - 0  Change in appetite 2 - 0  Feeling bad or failure about yourself  0 - 0  Trouble concentrating 0 - 0  Moving slowly or fidgety/restless 0 - 0  Suicidal thoughts 0 - 0  PHQ-9 Score 6 - 0  Difficult doing work/chores Somewhat difficult - Not difficult at all  Some recent data might be hidden    Social History   Tobacco Use  . Smoking status: Never Smoker  . Smokeless tobacco: Never Used  Vaping Use  . Vaping Use: Never used  Substance Use Topics  . Alcohol use: Not Currently  . Drug use: No    Review of Systems Per HPI unless specifically indicated above     Objective:    BP (!) 141/65   Pulse 88   Temp 98 F (36.7 C) (Temporal)   Resp 16   Ht 4\' 11"  (1.499 m)   Wt 110 lb 9.6 oz (50.2 kg)   SpO2 98%   BMI 22.34 kg/m   Wt  Readings from Last 3 Encounters:  09/13/19 110 lb 9.6 oz (50.2 kg)  08/15/19 118 lb (53.5 kg)  08/14/19 118 lb 12.8 oz (53.9 kg)    Physical Exam Vitals and nursing note reviewed.  Constitutional:      General: She is not in acute distress.    Appearance: She is well-developed. She is not diaphoretic.     Comments: Chronically ill thin 87 yr old female, thin  HENT:     Head: Normocephalic and atraumatic.     Comments: Mildly hoarse voice improved Eyes:     General:        Right eye: No discharge.        Left eye: No discharge.     Conjunctiva/sclera: Conjunctivae normal.  Neck:     Thyroid: No thyromegaly.  Cardiovascular:     Rate and Rhythm: Normal rate and regular rhythm.     Heart sounds: Normal heart sounds. No murmur heard.   Pulmonary:     Effort: Pulmonary effort is normal. No respiratory distress.     Breath sounds: Normal breath sounds. No wheezing or rales.  Musculoskeletal:     Cervical back: Normal range of motion and neck supple.     Comments: Has walker  Lymphadenopathy:  Cervical: No cervical adenopathy.  Skin:    General: Skin is warm and dry.     Findings: No erythema or rash.  Neurological:     Mental Status: She is alert and oriented to person, place, and time.  Psychiatric:        Behavior: Behavior normal.     Comments: Well groomed, good eye contact, normal speech and thoughts       Diabetic Foot Exam - Simple   Simple Foot Form Diabetic Foot exam was performed with the following findings: Yes 09/13/2019  2:15 PM  Visual Inspection No deformities, no ulcerations, no other skin breakdown bilaterally: Yes Sensation Testing Intact to touch and monofilament testing bilaterally: Yes Pulse Check Posterior Tibialis and Dorsalis pulse intact bilaterally: Yes Comments     Results for orders placed or performed in visit on 09/06/19  T4, free  Result Value Ref Range   Free T4 1.3 0.8 - 1.8 ng/dL  TSH  Result Value Ref Range   TSH 5.61 (H)  0.40 - 4.50 mIU/L  Lipid panel  Result Value Ref Range   Cholesterol 168 <200 mg/dL   HDL 66 > OR = 50 mg/dL   Triglycerides 173 (H) <150 mg/dL   LDL Cholesterol (Calc) 75 mg/dL (calc)   Total CHOL/HDL Ratio 2.5 <5.0 (calc)   Non-HDL Cholesterol (Calc) 102 <130 mg/dL (calc)  COMPLETE METABOLIC PANEL WITH GFR  Result Value Ref Range   Glucose, Bld 116 (H) 65 - 99 mg/dL   BUN 15 7 - 25 mg/dL   Creat 0.94 (H) 0.60 - 0.88 mg/dL   GFR, Est Non African American 54 (L) > OR = 60 mL/min/1.54m2   GFR, Est African American 63 > OR = 60 mL/min/1.48m2   BUN/Creatinine Ratio 16 6 - 22 (calc)   Sodium 136 135 - 146 mmol/L   Potassium 4.1 3.5 - 5.3 mmol/L   Chloride 102 98 - 110 mmol/L   CO2 26 20 - 32 mmol/L   Calcium 9.6 8.6 - 10.4 mg/dL   Total Protein 6.1 6.1 - 8.1 g/dL   Albumin 4.0 3.6 - 5.1 g/dL   Globulin 2.1 1.9 - 3.7 g/dL (calc)   AG Ratio 1.9 1.0 - 2.5 (calc)   Total Bilirubin 0.4 0.2 - 1.2 mg/dL   Alkaline phosphatase (APISO) 69 37 - 153 U/L   AST 17 10 - 35 U/L   ALT 12 6 - 29 U/L  CBC with Differential/Platelet  Result Value Ref Range   WBC 7.1 3.8 - 10.8 Thousand/uL   RBC 3.15 (L) 3.80 - 5.10 Million/uL   Hemoglobin 9.8 (L) 11.7 - 15.5 g/dL   HCT 30.5 (L) 35 - 45 %   MCV 96.8 80.0 - 100.0 fL   MCH 31.1 27.0 - 33.0 pg   MCHC 32.1 32.0 - 36.0 g/dL   RDW 13.9 11.0 - 15.0 %   Platelets 364 140 - 400 Thousand/uL   MPV 8.8 7.5 - 12.5 fL   Neutro Abs 3,891 1,500 - 7,800 cells/uL   Lymphs Abs 2,513 850 - 3,900 cells/uL   Absolute Monocytes 525 200 - 950 cells/uL   Eosinophils Absolute 142 15 - 500 cells/uL   Basophils Absolute 28 0 - 200 cells/uL   Neutrophils Relative % 54.8 %   Total Lymphocyte 35.4 %   Monocytes Relative 7.4 %   Eosinophils Relative 2.0 %   Basophils Relative 0.4 %  Hemoglobin A1c  Result Value Ref Range   Hgb A1c MFr Bld  6.6 (H) <5.7 % of total Hgb   Mean Plasma Glucose 143 (calc)   eAG (mmol/L) 7.9 (calc)      Assessment & Plan:   Problem  List Items Addressed This Visit    CKD stage 3 due to type 2 diabetes mellitus (HCC)   Anxiety associated with depression   Relevant Medications   mirtazapine (REMERON) 15 MG tablet    Other Visit Diagnoses    Controlled type 2 diabetes mellitus with diabetic nephropathy, without long-term current use of insulin (HCC)    -  Primary   Restless leg syndrome       Relevant Medications   pramipexole (MIRAPEX) 0.25 MG tablet   Essential hypertension       Psychophysiological insomnia       Relevant Medications   mirtazapine (REMERON) 15 MG tablet      #Insomnia / Poor appetite / anxiety / RLS Worsening problem, difficulty in determining cause, seems RLS may be source, Palliative had recommended ropinirole, however on attempting to prescribe she has red dye allergy that would limit her taking that medication. - Limited benefit on escitalopram but difficult to determine if still helping - Failed melatonin but questionable adherence - Will START trial of Mirtazapine 15mg  nightly for insomnia, mood/anxiety, and improve appetite PO for maintain weight - STOP Gabapentin. - Trial on Pramipexole (mirapex) nightly with titration dose, instead of ropinirole  Diabetes / Hypoglycemia A1c down to 6.6 Concern that she may be experiencing hypoglycemic symptoms often, based on her reported CBG and concerns, still on sulfonylurea with poor PO - DISCONTINUE Glimepiride entirely (was on half 4mg  or 2mg  dose daily) - Counseling on acceptable CBG range, okay with hyperglycemia >150-200 for now goal to avoid hypo  Pain / Hx sacral fracture Using walker Less pain but still has issues with limited mobility function by her report Off opiates Advised to keep working with Palliative care on pain management options for functional goals, will defer any new pain management changes today given already starting new medication and made changes to existing meds.  Hyponatremia has improved, she may limit sodium tablets  and maintain some limit on water, avoid excessive water intake.  Meds ordered this encounter  Medications  . mirtazapine (REMERON) 15 MG tablet    Sig: Take 1 tablet (15 mg total) by mouth at bedtime.    Dispense:  30 tablet    Refill:  2  . pramipexole (MIRAPEX) 0.25 MG tablet    Sig: Take 1 tablet (0.25 mg total) by mouth at bedtime. May increase to 2 tabs after 1 week if not improved. For Restless Legs    Dispense:  60 tablet    Refill:  1     Follow up plan: Return in about 4 weeks (around 10/11/2019), or if symptoms worsen or fail to improve, for 4 week follow-up insomnia, RLS, weakness palliatiave.  Completed Handicap placard form for her.  Nobie Putnam, DO [facility] 09/13/2019, 1:52 PM

## 2019-09-13 NOTE — Patient Instructions (Addendum)
Thank you for coming to the office today.  STOP Glimepiride. It is okay if your sugar is 100-200 range.  Sodium is normal.  START Mirtazapine 15mg  nightly at bedtime for mood and insomnia. It can help improve appetite.  Start Pramipexole 0.25mg  nightly at bedtime for restless leg. After 1 week, can increase to 2 pills.  May limit salt pills for now. You can still drink water. Stay hydrated.  We will reach out to [place] as well.  May reconsider pain management options.    Please schedule a Follow-up Appointment to: Return in about 4 weeks (around 10/11/2019), or if symptoms worsen or fail to improve, for 4 week follow-up insomnia, RLS, weakness palliatiave.  If you have any other questions or concerns, please feel free to call the office or send a message through [place]. You may also schedule an earlier appointment if necessary.  Additionally, you may be receiving a survey about your experience at our office within a few days to 1 week by e-mail or mail. We value your feedback.  Nobie Putnam, DO [place]

## 2019-09-14 ENCOUNTER — Encounter: Payer: Self-pay | Admitting: Family Medicine

## 2019-09-19 ENCOUNTER — Telehealth: Payer: PPO

## 2019-09-19 ENCOUNTER — Ambulatory Visit: Payer: Self-pay | Admitting: *Deleted

## 2019-09-19 ENCOUNTER — Telehealth: Payer: Self-pay | Admitting: Licensed Clinical Social Worker

## 2019-09-19 NOTE — Telephone Encounter (Signed)
Chronic Care Management    Clinical Social Work General Follow Up Note  09/19/2019 Name: NURIA PHEBUS MRN: [id number] DOB: Mar 19, 1929  Lisa Mills is a 86 y.o. year old female who is a primary care patient of [name], DO. The CCM team was consulted for assistance with Grief Counseling.   Review of patient status, including review of consultants reports, relevant laboratory and other test results, and collaboration with appropriate care team members and the patient's provider was performed as part of comprehensive patient evaluation and provision of chronic care management services.    LCSW completed two CCM outreach attempts today but was unable to reach patient successfully. A HIPPA compliant voice message was left encouraging patient to return call once available. LCSW will ask Scheduling Care Guide to contact patient and reschedule initial CCM Social Work appointment.   Outpatient Encounter Medications as of 09/19/2019  Medication Sig Note  . Blood Glucose Monitoring Suppl (ONE TOUCH BASIC SYSTEM) w/Device KIT Use glucometer to check blood sugar daily.   . cholecalciferol (VITAMIN D) 1000 units tablet Take 2,000 Units by mouth daily.    Marland Kitchen escitalopram (LEXAPRO) 5 MG tablet TAKE 1 TABLET BY MOUTH ONCE DAILY WITH FOOD (Patient taking differently: Take 5 mg by mouth daily. )   . esomeprazole (NEXIUM) 20 MG capsule Take 20 mg by mouth daily at 12 noon.   . feeding supplement, ENSURE ENLIVE, (ENSURE ENLIVE) LIQD Take 237 mLs by mouth 3 (three) times daily.   . ferrous sulfate 325 (65 FE) MG tablet Take 325 mg by mouth daily.    Marland Kitchen glucose blood (ONE TOUCH ULTRA TEST) test strip CHECK BLOOD SUGAR UP TO 2 TIMES A DAY.   . hydrocortisone (ANUSOL-HC) 2.5 % rectal cream USE 1 APPLICATION RECTALLY TWICE DAILY AS DIRECTED   . hydrocortisone (ANUSOL-HC) 25 MG suppository Place 1 suppository (25 mg total) rectally 2 (two) times daily. For 7 days for hemorrhoid   . hydrocortisone 2.5  % cream Place rectally.   Marland Kitchen levothyroxine (SYNTHROID) 25 MCG tablet TAKE 1 TABLET BY MOUTH ONCE DAILY ON AN EMPTY STOMACH. WAIT 30 MINUTES BEFORE TAKING OTHER MEDS. (Patient taking differently: Take 25 mcg by mouth daily before breakfast. TAKE 1 TABLET BY MOUTH ONCE DAILY ON AN EMPTY STOMACH. WAIT 30 MINUTES BEFORE TAKING OTHER MEDS.)   . lisinopril (ZESTRIL) 5 MG tablet Take 5 mg by mouth daily.   . mirtazapine (REMERON) 15 MG tablet Take 1 tablet (15 mg total) by mouth at bedtime.   . nitrofurantoin (MACRODANTIN) 100 MG capsule Take 1 capsule (100 mg total) by mouth daily.   Letta Pate DELICA LANCETS 33G MISC 1 each by Does not apply route 3 (three) times daily. Dx:E11.9   . Plecanatide (TRULANCE) 3 MG TABS Take 3 mg by mouth daily.   . polyethylene glycol (MIRALAX / GLYCOLAX) 17 g packet Take 17 g by mouth daily as needed for moderate constipation or severe constipation.   . pramipexole (MIRAPEX) 0.25 MG tablet Take 1 tablet (0.25 mg total) by mouth at bedtime. May increase to 2 tabs after 1 week if not improved. For Restless Legs   . Pramoxine-HC (HYDROCORTISONE ACE-PRAMOXINE) 2.5-1 % CREA Place rectally.   . senna (SENOKOT) 8.6 MG TABS tablet Take 1 tablet (8.6 mg total) by mouth 2 (two) times daily. (Patient taking differently: Take 1 tablet by mouth 2 (two) times daily as needed for mild constipation or moderate constipation. )   . simvastatin (ZOCOR) 40 MG tablet TAKE  1 TABLET BY MOUTH AT BEDTIME (Patient taking differently: Take 40 mg by mouth at bedtime. ) 07/04/2019: May interact with patients prednisone   No facility-administered encounter medications on file as of 09/19/2019.    Follow Up Plan: [place] will reach out to patient to reschedule appointment.   Eula Fried, BSW, MSW, [email or web address] Phone: [phone]

## 2019-09-19 NOTE — Telephone Encounter (Signed)
Patient states she thinks she has a UTI- she had been given medication to help with her sleep and that is when her symptoms started- patient thought her symptoms were related to her medication- but she stopped the medication and her symptoms are still present.  Patient had scheduled appointment with urology on Friday but she can not find a ride. Patient reports that she was given medication that has helped her feel the need to go to bathroom again and she is pleased. Patient has been perscribed Macrodantin- she is taking every day. Patient is having pain with urination, urine has odor, back pain and bladder pain. Patient has stopped the new sleep medications for now- she does not want to continue them until after her symptoms have resolved.  Patient request medication if possible- she is having trouble finding rides to the doctor. Patient advised that she has been taking prophylactic antibiotic for her urinary tract and she may need to have a culture to verify bacteria .  Reason for Disposition . Age > 50 years  Answer Assessment - Initial Assessment Questions 1. SEVERITY: "How bad is the pain?"  (e.g., Scale 1-10; mild, moderate, or severe)   - MILD (1-3): complains slightly about urination hurting   - MODERATE (4-7): interferes with normal activities     - SEVERE (8-10): excruciating, unwilling or unable to urinate because of the pain      moderate 2. FREQUENCY: "How many times have you had painful urination today?"      3 times 3. PATTERN: "Is pain present every time you urinate or just sometimes?"      Patient states she goes frequently 4. ONSET: "When did the painful urination start?"      09/13/19- started 5. FEVER: "Do you have a fever?" If Yes, ask: "What is your temperature, how was it measured, and when did it start?"     no 6. PAST UTI: "Have you had a urine infection before?" If Yes, ask: "When was the last time?" and "What happened that time?"      Yes- 1 time- was asymptomatic - 3  years ago 7. CAUSE: "What do you think is causing the painful urination?"  (e.g., UTI, scratch, Herpes sore)     UTI 8. OTHER SYMPTOMS: "Do you have any other symptoms?" (e.g., flank pain, vaginal discharge, genital sores, urgency, blood in urine)     Back pain, bladder pain 9. PREGNANCY: "Is there any chance you are pregnant?" "When was your last menstrual period?"     n/a  Protocols used: [place]

## 2019-09-20 ENCOUNTER — Other Ambulatory Visit: Payer: Self-pay

## 2019-09-20 ENCOUNTER — Telehealth: Payer: Self-pay | Admitting: Pharmacist

## 2019-09-20 ENCOUNTER — Telehealth (INDEPENDENT_AMBULATORY_CARE_PROVIDER_SITE_OTHER): Payer: PPO | Admitting: Family Medicine

## 2019-09-20 ENCOUNTER — Encounter: Payer: Self-pay | Admitting: Family Medicine

## 2019-09-20 ENCOUNTER — Telehealth: Payer: PPO

## 2019-09-20 DIAGNOSIS — N3946 Mixed incontinence: Secondary | ICD-10-CM | POA: Diagnosis not present

## 2019-09-20 DIAGNOSIS — N3000 Acute cystitis without hematuria: Secondary | ICD-10-CM | POA: Diagnosis not present

## 2019-09-20 MED ORDER — CIPROFLOXACIN HCL 250 MG PO TABS: 250.0000 mg | ORAL_TABLET | Freq: Two times a day (BID) | ORAL | 0 refills | Status: DC

## 2019-09-20 NOTE — Telephone Encounter (Signed)
Can you notify her we would need to address this with a Virtual Telephone visit? We have some openings today, we can discuss and treat on the phone for now if she cannot make it to office.  Nobie Putnam, DO [facility] 09/20/2019, 10:06 AM

## 2019-09-20 NOTE — Telephone Encounter (Signed)
  Chronic Care Management   Outreach Note  09/20/2019 Name: Lisa Mills MRN: [id number] DOB: [birth year]  Referred by: Olin Hauser, DO Reason for referral : No chief complaint on file.  Was unable to reach patient via telephone today and have left HIPAA compliant voicemail asking patient to return my call.    Follow Up Plan: Will collaborate with Care Guide to outreach to schedule follow up with me  Harlow Asa, PharmD, [facility] [phone]

## 2019-09-20 NOTE — Progress Notes (Signed)
Virtual Visit via Telephone The purpose of this virtual visit is to provide medical care while limiting exposure to the novel coronavirus (COVID19) for both patient and office staff.  Consent was obtained for phone visit:  Yes.   Answered questions that patient had about telehealth interaction:  Yes.   I discussed the limitations, risks, security and privacy concerns of performing an evaluation and management service by telephone. I also discussed with the patient that there may be a patient responsible charge related to this service. The patient expressed understanding and agreed to proceed.  Patient Location: Home Provider Location: [facility]  ---------------------------------------------------------------------- Chief Complaint  Patient presents with  . Urinary Tract Infection    onset week    S: Reviewed CMA documentation. I have called patient and gathered additional HPI as follows:  UTI, recurrent / overactive bladder mixed incontinence - Last visit with me 09/13/19, for same problems including RLS insomnia, treated with Mirtazapine $RemoveBeforeDE'15mg'[id number]$  and Pramipexole 0.$RemoveBeforeDEI'25mg'[id number]$  nightly, see prior notes for background information. - Interval update with started those medicines at night to help her RLS and sleep, she has had persistent episodes of nocturia and urinary frequency with dysuria. She had symptoms onset 2 weeks ago with difficulty urinating and seen by Urology had pan sensitive urine culture, treated with Cipro 250 BID x 7 days then Macrobid 100 mg daily for prophylaxis, awaiting to return on 10/02/19 for BUA Urology for further testing may do urodynamics - She had prior history of side effects on oxybutynin in past  - Today patient reports had worsening onset past few nights, with dysuria frequency. Unable to get transportation to come to office for urine sample. - Still taking macrobid $RemoveBeforeD'100mg'DsZyaApcvpUaGg$  daily prophylaxis - Denies any fever or chills  Last seen on 09/07/19 with  [facility] Ralene Bathe, NP   Denies any high risk travel to areas of current concern for COVID19. Denies any known or suspected exposure to person with or possibly with COVID19.  Denies any fevers, chills, sweats, body ache, cough, shortness of breath, sinus pain or pressure, headache, abdominal pain, diarrhea  Past Medical History:  Diagnosis Date  . Dermatophytosis of nail   . GERD (gastroesophageal reflux disease)   . History of shingles   . Hypertension   . Keratoderma    Social History   Tobacco Use  . Smoking status: Never Smoker  . Smokeless tobacco: Never Used  Vaping Use  . Vaping Use: Never used  Substance Use Topics  . Alcohol use: Not Currently  . Drug use: No    Current Outpatient Medications:  .  Blood Glucose Monitoring Suppl ([facility]) w/Device KIT, Use glucometer to check blood sugar daily., Disp: 1 each, Rfl: 0 .  cholecalciferol (VITAMIN D) 1000 units tablet, Take 2,000 Units by mouth daily. , Disp: , Rfl:  .  escitalopram (LEXAPRO) 5 MG tablet, TAKE 1 TABLET BY MOUTH ONCE DAILY WITH FOOD (Patient taking differently: Take 5 mg by mouth daily. ), Disp: 90 tablet, Rfl: 3 .  esomeprazole (NEXIUM) 20 MG capsule, Take 20 mg by mouth daily at 12 noon., Disp: , Rfl:  .  feeding supplement, ENSURE ENLIVE, (ENSURE ENLIVE) LIQD, Take 237 mLs by mouth 3 (three) times daily., Disp: , Rfl:  .  glucose blood (ONE TOUCH ULTRA TEST) test strip, CHECK BLOOD SUGAR UP TO 2 TIMES A DAY., Disp: 100 each, Rfl: 11 .  hydrocortisone (ANUSOL-HC) 2.5 % rectal cream, USE 1 APPLICATION RECTALLY TWICE DAILY AS  DIRECTED, Disp: 30 g, Rfl: 0 .  hydrocortisone (ANUSOL-HC) 25 MG suppository, Place 1 suppository (25 mg total) rectally 2 (two) times daily. For 7 days for hemorrhoid, Disp: 14 suppository, Rfl: 0 .  levothyroxine (SYNTHROID) 25 MCG tablet, TAKE 1 TABLET BY MOUTH ONCE DAILY ON AN EMPTY STOMACH. WAIT 30 MINUTES BEFORE TAKING OTHER MEDS. (Patient taking  differently: Take 25 mcg by mouth daily before breakfast. TAKE 1 TABLET BY MOUTH ONCE DAILY ON AN EMPTY STOMACH. WAIT 30 MINUTES BEFORE TAKING OTHER MEDS.), Disp: 90 tablet, Rfl: 2 .  lisinopril (ZESTRIL) 5 MG tablet, Take 5 mg by mouth daily., Disp: , Rfl:  .  nitrofurantoin (MACRODANTIN) 100 MG capsule, Take 1 capsule (100 mg total) by mouth daily., Disp: 30 capsule, Rfl: 11 .  ONETOUCH DELICA LANCETS 96P MISC, 1 each by Does not apply route 3 (three) times daily. Dx:E11.9, Disp: 100 each, Rfl: 12 .  polyethylene glycol (MIRALAX / GLYCOLAX) 17 g packet, Take 17 g by mouth daily as needed for moderate constipation or severe constipation., Disp: 14 each, Rfl: 0 .  Pramoxine-HC (HYDROCORTISONE ACE-PRAMOXINE) 2.5-1 % CREA, Place rectally., Disp: , Rfl:  .  senna (SENOKOT) 8.6 MG TABS tablet, Take 1 tablet (8.6 mg total) by mouth 2 (two) times daily. (Patient taking differently: Take 1 tablet by mouth 2 (two) times daily as needed for mild constipation or moderate constipation. ), Disp: 120 tablet, Rfl: 0 .  simvastatin (ZOCOR) 40 MG tablet, TAKE 1 TABLET BY MOUTH AT BEDTIME (Patient taking differently: Take 40 mg by mouth at bedtime. ), Disp: 90 tablet, Rfl: 3 .  ferrous sulfate 325 (65 FE) MG tablet, Take 325 mg by mouth daily.  (Patient not taking: Reported on 09/20/2019), Disp: , Rfl:  .  hydrocortisone 2.5 % cream, Place rectally. (Patient not taking: Reported on 09/20/2019), Disp: , Rfl:  .  mirtazapine (REMERON) 15 MG tablet, Take 1 tablet (15 mg total) by mouth at bedtime. (Patient not taking: Reported on 09/20/2019), Disp: 30 tablet, Rfl: 2 .  Plecanatide (TRULANCE) 3 MG TABS, Take 3 mg by mouth daily. (Patient not taking: Reported on 09/20/2019), Disp: 30 tablet, Rfl: 0 .  pramipexole (MIRAPEX) 0.25 MG tablet, Take 1 tablet (0.25 mg total) by mouth at bedtime. May increase to 2 tabs after 1 week if not improved. For Restless Legs (Patient not taking: Reported on 09/20/2019), Disp: 60 tablet, Rfl:  1  Depression screen [facility] 2/9 09/14/2019 09/07/2019 03/15/2019  Decreased Interest 0 0 0  Down, Depressed, Hopeless 0 0 0  PHQ - 2 Score 0 0 0  Altered sleeping 3 - 0  Tired, decreased energy 1 - 0  Change in appetite 2 - 0  Feeling bad or failure about yourself  0 - 0  Trouble concentrating 0 - 0  Moving slowly or fidgety/restless 0 - 0  Suicidal thoughts 0 - 0  PHQ-9 Score 6 - 0  Difficult doing work/chores Somewhat difficult - Not difficult at all  Some recent data might be hidden    GAD 7 : Generalized Anxiety Score 06/06/2018 03/02/2018 12/01/2016 10/06/2016  Nervous, Anxious, on Edge (No Data) 0 0 0  Control/stop worrying - 0 1 0  Worry too much - different things - 0 0 0  Trouble relaxing - 0 0 0  Restless - 0 0 0  Easily annoyed or irritable - 0 0 0  Afraid - awful might happen - 0 0 0  Total GAD 7 Score - 0 1 0  Anxiety Difficulty - Not difficult at all Not difficult at all Not difficult at all    -------------------------------------------------------------------------- O: No physical exam performed due to remote telephone encounter.  Lab results reviewed.  Recent Results (from the past 2160 hour(s))  Basic metabolic panel     Status: Abnormal   Collection Time: 07/04/19 12:20 PM  Result Value Ref Range   Sodium 129 (L) 135 - 145 mmol/L   Potassium 4.4 3.5 - 5.1 mmol/L   Chloride 94 (L) 98 - 111 mmol/L   CO2 24 22 - 32 mmol/L   Glucose, Bld 134 (H) 70 - 99 mg/dL    Comment: Glucose reference range applies only to samples taken after fasting for at least 8 hours.   BUN 39 (H) 8 - 23 mg/dL   Creatinine, Ser 1.63 (H) 0.44 - 1.00 mg/dL   Calcium 9.2 8.9 - 10.3 mg/dL   GFR calc non Af Amer 28 (L) >60 mL/min   GFR calc Af Amer 32 (L) >60 mL/min   Anion gap 11 5 - 15    Comment: Performed at [facility], [address] [place], [place] [address]  CBC with Differential     Status: Abnormal   Collection Time: 07/04/19 12:20 PM  Result Value Ref Range   WBC  13.8 (H) 4.0 - 10.5 K/uL   RBC 3.40 (L) 3.87 - 5.11 MIL/uL   Hemoglobin 10.5 (L) 12.0 - 15.0 g/dL   HCT 31.5 (L) 36 - 46 %   MCV 92.6 80.0 - 100.0 fL   MCH 30.9 26.0 - 34.0 pg   MCHC 33.3 30.0 - 36.0 g/dL   RDW 12.8 11.5 - 15.5 %   Platelets 419 (H) 150 - 400 K/uL   nRBC 0.0 0.0 - 0.2 %   Neutrophils Relative % 76 %   Neutro Abs 10.6 (H) 1.7 - 7.7 K/uL   Lymphocytes Relative 14 %   Lymphs Abs 1.9 0.7 - 4.0 K/uL   Monocytes Relative 8 %   Monocytes Absolute 1.1 (H) 0 - 1 K/uL   Eosinophils Relative 1 %   Eosinophils Absolute 0.1 0 - 0 K/uL   Basophils Relative 0 %   Basophils Absolute 0.1 0 - 0 K/uL   Immature Granulocytes 1 %   Abs Immature Granulocytes 0.12 (H) 0.00 - 0.07 K/uL    Comment: Performed at [facility], [address], [place], [place] [address]  SARS Coronavirus 2 by RT PCR (hospital order, performed in [facility] hospital lab) Nasopharyngeal Nasopharyngeal Swab     Status: None   Collection Time: 07/04/19 12:58 PM   Specimen: Nasopharyngeal Swab  Result Value Ref Range   SARS Coronavirus 2 NEGATIVE NEGATIVE    Comment: (NOTE) SARS-CoV-2 target nucleic acids are NOT DETECTED.  The SARS-CoV-2 RNA is generally detectable in upper and lower respiratory specimens during the acute phase of infection. The lowest concentration of SARS-CoV-2 viral copies this assay can detect is 250 copies / mL. A negative result does not preclude SARS-CoV-2 infection and should not be used as the sole basis for treatment or other patient management decisions.  A negative result may occur with improper specimen collection / handling, submission of specimen other than nasopharyngeal swab, presence of viral mutation(s) within the areas targeted by this assay, and inadequate number of viral copies (<250 copies / mL). A negative result must be combined with clinical observations, patient history, and epidemiological information.  Fact Sheet for Patients:    StrictlyIdeas.no  Fact Sheet for Healthcare Providers: BankingDealers.co.za  This test is not yet approved or  cleared by the [place] FDA and has been authorized for detection and/or diagnosis of SARS-CoV-2 by FDA under an Emergency Use Authorization (EUA).  This EUA will remain in effect (meaning this test can be used) for the duration of the COVID-19 declaration under Section 564(b)(1) of the Act, 21 U.S.C. section 360bbb-3(b)(1), unless the authorization is terminated or revoked sooner.  Performed at [facility], [address] [place], [place] [address]   Urinalysis, Complete w Microscopic     Status: Abnormal   Collection Time: 07/04/19  3:55 PM  Result Value Ref Range   Color, Urine YELLOW (A) YELLOW   APPearance HAZY (A) CLEAR   Specific Gravity, Urine 1.011 1.005 - 1.030   pH 5.0 5.0 - 8.0   Glucose, UA NEGATIVE NEGATIVE mg/dL   Hgb urine dipstick NEGATIVE NEGATIVE   Bilirubin Urine NEGATIVE NEGATIVE   Ketones, ur NEGATIVE NEGATIVE mg/dL   Protein, ur NEGATIVE NEGATIVE mg/dL   Nitrite NEGATIVE NEGATIVE   Leukocytes,Ua SMALL (A) NEGATIVE   RBC / HPF 0-5 0 - 5 RBC/hpf   WBC, UA 6-10 0 - 5 WBC/hpf   Bacteria, UA RARE (A) NONE SEEN   Squamous Epithelial / LPF 6-10 0 - 5   Mucus PRESENT     Comment: Performed at [facility], [address] [place], [place] [address]  Urine Culture     Status: Abnormal   Collection Time: 07/04/19  3:55 PM   Specimen: Urine, Random  Result Value Ref Range   Specimen Description      URINE, RANDOM Performed at [facility], [address] [place], [place] [address]    Special Requests      NONE Performed at [facility], [address] [place], [place] [address]    Culture MULTIPLE SPECIES PRESENT, SUGGEST RECOLLECTION (A)    Report Status 07/06/2019 FINAL   CBC     Status: Abnormal   Collection Time: 07/05/19  4:44 AM  Result Value  Ref Range   WBC 8.8 4.0 - 10.5 K/uL   RBC 3.18 (L) 3.87 - 5.11 MIL/uL   Hemoglobin 10.0 (L) 12.0 - 15.0 g/dL   HCT 28.7 (L) 36 - 46 %   MCV 90.3 80.0 - 100.0 fL   MCH 31.4 26.0 - 34.0 pg   MCHC 34.8 30.0 - 36.0 g/dL   RDW 13.0 11.5 - 15.5 %   Platelets 386 150 - 400 K/uL   nRBC 0.0 0.0 - 0.2 %    Comment: Performed at [facility], [address] [place], [place] [address]  Basic metabolic panel     Status: Abnormal   Collection Time: 07/05/19  4:44 AM  Result Value Ref Range   Sodium 131 (L) 135 - 145 mmol/L   Potassium 4.8 3.5 - 5.1 mmol/L   Chloride 99 98 - 111 mmol/L   CO2 25 22 - 32 mmol/L   Glucose, Bld 130 (H) 70 - 99 mg/dL    Comment: Glucose reference range applies only to samples taken after fasting for at least 8 hours.   BUN 30 (H) 8 - 23 mg/dL   Creatinine, Ser 1.33 (H) 0.44 - 1.00 mg/dL   Calcium 8.6 (L) 8.9 - 10.3 mg/dL   GFR calc non Af Amer 36 (L) >60 mL/min   GFR calc Af Amer 42 (L) >60 mL/min   Anion gap 7 5 -  15    Comment: Performed at [facility], [address] [place], [place] [address]  CBC     Status: Abnormal   Collection Time: 07/06/19  5:26 AM  Result Value Ref Range   WBC 8.3 4.0 - 10.5 K/uL   RBC 2.80 (L) 3.87 - 5.11 MIL/uL   Hemoglobin 8.9 (L) 12.0 - 15.0 g/dL   HCT 25.4 (L) 36 - 46 %   MCV 90.7 80.0 - 100.0 fL   MCH 31.8 26.0 - 34.0 pg   MCHC 35.0 30.0 - 36.0 g/dL   RDW 13.1 11.5 - 15.5 %   Platelets 347 150 - 400 K/uL   nRBC 0.0 0.0 - 0.2 %    Comment: Performed at [facility], [address] [place], [place] [address]  Basic metabolic panel     Status: Abnormal   Collection Time: 07/06/19  5:26 AM  Result Value Ref Range   Sodium 133 (L) 135 - 145 mmol/L   Potassium 4.7 3.5 - 5.1 mmol/L   Chloride 104 98 - 111 mmol/L   CO2 22 22 - 32 mmol/L   Glucose, Bld 147 (H) 70 - 99 mg/dL    Comment: Glucose reference range applies only to samples taken after fasting for at least 8 hours.   BUN 21 8 - 23  mg/dL   Creatinine, Ser 1.13 (H) 0.44 - 1.00 mg/dL   Calcium 8.6 (L) 8.9 - 10.3 mg/dL   GFR calc non Af Amer 44 (L) >60 mL/min   GFR calc Af Amer 51 (L) >60 mL/min   Anion gap 7 5 - 15    Comment: Performed at [facility], [address] [place], [place] [address]  Glucose, capillary     Status: Abnormal   Collection Time: 07/06/19  9:45 AM  Result Value Ref Range   Glucose-Capillary 108 (H) 70 - 99 mg/dL    Comment: Glucose reference range applies only to samples taken after fasting for at least 8 hours.  Glucose, capillary     Status: Abnormal   Collection Time: 07/06/19  3:24 PM  Result Value Ref Range   Glucose-Capillary 101 (H) 70 - 99 mg/dL    Comment: Glucose reference range applies only to samples taken after fasting for at least 8 hours.  Glucose, capillary     Status: Abnormal   Collection Time: 07/06/19  4:55 PM  Result Value Ref Range   Glucose-Capillary 118 (H) 70 - 99 mg/dL    Comment: Glucose reference range applies only to samples taken after fasting for at least 8 hours.  Urine Culture     Status: None   Collection Time: 07/06/19  6:16 PM   Specimen: Urine, Random  Result Value Ref Range   Specimen Description      URINE, RANDOM Performed at [facility], [address] [place], [place] [address]    Special Requests      NONE Performed at [facility], [address] [place], [place] [address]    Culture      NO GROWTH Performed at [facility], [place] [address], [place],  [address]    Report Status 07/09/2019 FINAL   Glucose, capillary     Status: Abnormal   Collection Time: 07/06/19  9:09 PM  Result Value Ref Range   Glucose-Capillary 192 (H) 70 - 99 mg/dL    Comment: Glucose reference range applies only to samples taken after fasting for at least 8 hours.  Comment 1 Notify RN   CBC     Status: Abnormal   Collection Time: 07/07/19  5:16 AM  Result Value Ref Range   WBC 8.2 4.0 - 10.5 K/uL   RBC 2.78 (L)  3.87 - 5.11 MIL/uL   Hemoglobin 8.7 (L) 12.0 - 15.0 g/dL   HCT 26.1 (L) 36 - 46 %   MCV 93.9 80.0 - 100.0 fL   MCH 31.3 26.0 - 34.0 pg   MCHC 33.3 30.0 - 36.0 g/dL   RDW 12.7 11.5 - 15.5 %   Platelets 341 150 - 400 K/uL   nRBC 0.0 0.0 - 0.2 %    Comment: Performed at [facility], [address] [place], [place] [address]  Basic metabolic panel     Status: Abnormal   Collection Time: 07/07/19  5:16 AM  Result Value Ref Range   Sodium 135 135 - 145 mmol/L   Potassium 4.4 3.5 - 5.1 mmol/L   Chloride 105 98 - 111 mmol/L   CO2 24 22 - 32 mmol/L   Glucose, Bld 132 (H) 70 - 99 mg/dL    Comment: Glucose reference range applies only to samples taken after fasting for at least 8 hours.   BUN 18 8 - 23 mg/dL   Creatinine, Ser 1.19 (H) 0.44 - 1.00 mg/dL   Calcium 8.5 (L) 8.9 - 10.3 mg/dL   GFR calc non Af Amer 41 (L) >60 mL/min   GFR calc Af Amer 48 (L) >60 mL/min   Anion gap 6 5 - 15    Comment: Performed at [facility], [address] [place], [place] [address]  Glucose, capillary     Status: Abnormal   Collection Time: 07/07/19  7:44 AM  Result Value Ref Range   Glucose-Capillary 146 (H) 70 - 99 mg/dL    Comment: Glucose reference range applies only to samples taken after fasting for at least 8 hours.   Comment 1 Notify RN   CBC with Differential     Status: Abnormal   Collection Time: 07/09/19  2:32 PM  Result Value Ref Range   WBC 12.5 (H) 4.0 - 10.5 K/uL   RBC 3.59 (L) 3.87 - 5.11 MIL/uL   Hemoglobin 11.2 (L) 12.0 - 15.0 g/dL   HCT 32.3 (L) 36 - 46 %   MCV 90.0 80.0 - 100.0 fL   MCH 31.2 26.0 - 34.0 pg   MCHC 34.7 30.0 - 36.0 g/dL   RDW 12.7 11.5 - 15.5 %   Platelets 387 150 - 400 K/uL   nRBC 0.0 0.0 - 0.2 %   Neutrophils Relative % 76 %   Neutro Abs 9.7 (H) 1.7 - 7.7 K/uL   Lymphocytes Relative 12 %   Lymphs Abs 1.5 0.7 - 4.0 K/uL   Monocytes Relative 8 %   Monocytes Absolute 1.0 0 - 1 K/uL   Eosinophils Relative 2 %   Eosinophils Absolute 0.2 0  - 0 K/uL   Basophils Relative 1 %   Basophils Absolute 0.1 0 - 0 K/uL   Immature Granulocytes 1 %   Abs Immature Granulocytes 0.06 0.00 - 0.07 K/uL    Comment: Performed at [facility], [address] [place], [place] [address]  Comprehensive metabolic panel     Status: Abnormal   Collection Time: 07/09/19  2:32 PM  Result Value Ref Range   Sodium 130 (L) 135 - 145 mmol/L   Potassium 4.4 3.5 - 5.1 mmol/L   Chloride 96 (  L) 98 - 111 mmol/L   CO2 21 (L) 22 - 32 mmol/L   Glucose, Bld 116 (H) 70 - 99 mg/dL    Comment: Glucose reference range applies only to samples taken after fasting for at least 8 hours.   BUN 15 8 - 23 mg/dL   Creatinine, Ser 1.09 (H) 0.44 - 1.00 mg/dL   Calcium 10.0 8.9 - 10.3 mg/dL   Total Protein 7.2 6.5 - 8.1 g/dL   Albumin 4.0 3.5 - 5.0 g/dL   AST 25 15 - 41 U/L   ALT 13 0 - 44 U/L   Alkaline Phosphatase 88 38 - 126 U/L   Total Bilirubin 0.8 0.3 - 1.2 mg/dL   GFR calc non Af Amer 46 (L) >60 mL/min   GFR calc Af Amer 53 (L) >60 mL/min   Anion gap 13 5 - 15    Comment: Performed at [facility], [address] [place], [place] [address]  Urinalysis, Complete w Microscopic     Status: Abnormal   Collection Time: 07/09/19  8:54 PM  Result Value Ref Range   Color, Urine STRAW (A) YELLOW   APPearance CLEAR (A) CLEAR   Specific Gravity, Urine 1.006 1.005 - 1.030   pH 5.0 5.0 - 8.0   Glucose, UA NEGATIVE NEGATIVE mg/dL   Hgb urine dipstick MODERATE (A) NEGATIVE   Bilirubin Urine NEGATIVE NEGATIVE   Ketones, ur NEGATIVE NEGATIVE mg/dL   Protein, ur NEGATIVE NEGATIVE mg/dL   Nitrite NEGATIVE NEGATIVE   Leukocytes,Ua NEGATIVE NEGATIVE   RBC / HPF 0-5 0 - 5 RBC/hpf   WBC, UA 0-5 0 - 5 WBC/hpf   Bacteria, UA NONE SEEN NONE SEEN   Squamous Epithelial / LPF NONE SEEN 0 - 5   Mucus PRESENT     Comment: Performed at [facility], [address] [place], [place] [address]  SARS Coronavirus 2 by RT PCR (hospital order, performed in  [place] hospital lab) Nasopharyngeal Nasopharyngeal Swab     Status: None   Collection Time: 07/09/19 10:46 PM   Specimen: Nasopharyngeal Swab  Result Value Ref Range   SARS Coronavirus 2 NEGATIVE NEGATIVE    Comment: (NOTE) SARS-CoV-2 target nucleic acids are NOT DETECTED.  The SARS-CoV-2 RNA is generally detectable in upper and lower respiratory specimens during the acute phase of infection. The lowest concentration of SARS-CoV-2 viral copies this assay can detect is 250 copies / mL. A negative result does not preclude SARS-CoV-2 infection and should not be used as the sole basis for treatment or other patient management decisions.  A negative result may occur with improper specimen collection / handling, submission of specimen other than nasopharyngeal swab, presence of viral mutation(s) within the areas targeted by this assay, and inadequate number of viral copies (<250 copies / mL). A negative result must be combined with clinical observations, patient history, and epidemiological information.  Fact Sheet for Patients:   StrictlyIdeas.no  Fact Sheet for Healthcare Providers: BankingDealers.co.za  This test is not yet approved or  cleared by the [place] FDA and has been authorized for detection and/or diagnosis of SARS-CoV-2 by FDA under an Emergency Use Authorization (EUA).  This EUA will remain in effect (meaning this test can be used) for the duration of the COVID-19 declaration under Section 564(b)(1) of the Act, 21 U.S.C. section 360bbb-3(b)(1), unless the authorization is terminated or revoked sooner.  Performed at [facility], [address] [place], [place] [address]   Basic metabolic panel  Status: Abnormal   Collection Time: 07/10/19  4:03 AM  Result Value Ref Range   Sodium 133 (L) 135 - 145 mmol/L   Potassium 3.9 3.5 - 5.1 mmol/L   Chloride 98 98 - 111 mmol/L   CO2 29 22 - 32 mmol/L    Glucose, Bld 53 (L) 70 - 99 mg/dL    Comment: Glucose reference range applies only to samples taken after fasting for at least 8 hours.   BUN 14 8 - 23 mg/dL   Creatinine, Ser 1.08 (H) 0.44 - 1.00 mg/dL   Calcium 9.1 8.9 - 10.3 mg/dL   GFR calc non Af Amer 46 (L) >60 mL/min   GFR calc Af Amer 53 (L) >60 mL/min   Anion gap 6 5 - 15    Comment: Performed at [facility], [address] [place], [place] [address]  CBC     Status: Abnormal   Collection Time: 07/10/19  4:03 AM  Result Value Ref Range   WBC 9.2 4.0 - 10.5 K/uL   RBC 3.09 (L) 3.87 - 5.11 MIL/uL   Hemoglobin 9.5 (L) 12.0 - 15.0 g/dL   HCT 27.4 (L) 36 - 46 %   MCV 88.7 80.0 - 100.0 fL   MCH 30.7 26.0 - 34.0 pg   MCHC 34.7 30.0 - 36.0 g/dL   RDW 12.7 11.5 - 15.5 %   Platelets 357 150 - 400 K/uL   nRBC 0.0 0.0 - 0.2 %    Comment: Performed at [facility], [address] [place], [place] [address]  Hemoglobin A1c     Status: Abnormal   Collection Time: 07/10/19  4:03 AM  Result Value Ref Range   Hgb A1c MFr Bld 7.2 (H) 4.8 - 5.6 %    Comment: (NOTE)         Prediabetes: 5.7 - 6.4         Diabetes: >6.4         Glycemic control for adults with diabetes: <7.0    Mean Plasma Glucose 160 mg/dL    Comment: (NOTE) Performed At: [facility] [place], [place] [id number] Rush Farmer MD [id number]   Glucose, capillary     Status: Abnormal   Collection Time: 07/10/19  7:32 AM  Result Value Ref Range   Glucose-Capillary 49 (L) 70 - 99 mg/dL    Comment: Glucose reference range applies only to samples taken after fasting for at least 8 hours.   Comment 1 Notify RN    Comment 2 Document in Chart   Glucose, capillary     Status: Abnormal   Collection Time: 07/10/19  9:06 AM  Result Value Ref Range   Glucose-Capillary 128 (H) 70 - 99 mg/dL    Comment: Glucose reference range applies only to samples taken after fasting for at least 8 hours.  Glucose, capillary     Status:  Abnormal   Collection Time: 07/10/19 11:55 AM  Result Value Ref Range   Glucose-Capillary 44 (LL) 70 - 99 mg/dL    Comment: Glucose reference range applies only to samples taken after fasting for at least 8 hours. REPEATED TO VERIFY, CHARGE CREDITED Performed at [facility], [address] [place], [place] [address]    Comment 1 Notify RN    Comment 2 Document in Chart   Glucose, capillary     Status: Abnormal   Collection Time: 07/10/19 12:02 PM  Result Value Ref Range   Glucose-Capillary 41 (LL) 70 -  99 mg/dL    Comment: Glucose reference range applies only to samples taken after fasting for at least 8 hours.   Comment 1 Notify RN    Comment 2 Document in Chart   Glucose, capillary     Status: Abnormal   Collection Time: 07/10/19  1:04 PM  Result Value Ref Range   Glucose-Capillary 100 (H) 70 - 99 mg/dL    Comment: Glucose reference range applies only to samples taken after fasting for at least 8 hours.   Comment 1 Notify RN    Comment 2 Document in Chart   Glucose, capillary     Status: Abnormal   Collection Time: 07/10/19  4:51 PM  Result Value Ref Range   Glucose-Capillary 132 (H) 70 - 99 mg/dL    Comment: Glucose reference range applies only to samples taken after fasting for at least 8 hours.   Comment 1 Notify RN    Comment 2 Document in Chart   Glucose, capillary     Status: Abnormal   Collection Time: 07/10/19 10:30 PM  Result Value Ref Range   Glucose-Capillary 109 (H) 70 - 99 mg/dL    Comment: Glucose reference range applies only to samples taken after fasting for at least 8 hours.  CBC     Status: Abnormal   Collection Time: 07/11/19  4:32 AM  Result Value Ref Range   WBC 9.3 4.0 - 10.5 K/uL   RBC 3.13 (L) 3.87 - 5.11 MIL/uL   Hemoglobin 9.7 (L) 12.0 - 15.0 g/dL   HCT 28.9 (L) 36 - 46 %   MCV 92.3 80.0 - 100.0 fL   MCH 31.0 26.0 - 34.0 pg   MCHC 33.6 30.0 - 36.0 g/dL   RDW 12.7 11.5 - 15.5 %   Platelets 362 150 - 400 K/uL   nRBC 0.0 0.0 - 0.2 %     Comment: Performed at [facility], [address], [place], [place] [address]  Basic metabolic panel     Status: Abnormal   Collection Time: 07/11/19  4:32 AM  Result Value Ref Range   Sodium 132 (L) 135 - 145 mmol/L   Potassium 3.9 3.5 - 5.1 mmol/L   Chloride 99 98 - 111 mmol/L   CO2 26 22 - 32 mmol/L   Glucose, Bld 99 70 - 99 mg/dL    Comment: Glucose reference range applies only to samples taken after fasting for at least 8 hours.   BUN 12 8 - 23 mg/dL   Creatinine, Ser 1.10 (H) 0.44 - 1.00 mg/dL   Calcium 8.5 (L) 8.9 - 10.3 mg/dL   GFR calc non Af Amer 45 (L) >60 mL/min   GFR calc Af Amer 52 (L) >60 mL/min   Anion gap 7 5 - 15    Comment: Performed at [facility], [address] [place], [place] [address]  Glucose, capillary     Status: None   Collection Time: 07/11/19  8:19 AM  Result Value Ref Range   Glucose-Capillary 98 70 - 99 mg/dL    Comment: Glucose reference range applies only to samples taken after fasting for at least 8 hours.   Comment 1 Notify RN    Comment 2 Document in Chart   Glucose, capillary     Status: None   Collection Time: 07/11/19 11:43 AM  Result Value Ref Range   Glucose-Capillary 98 70 - 99 mg/dL    Comment: Glucose reference range applies only to samples taken after fasting for  at least 8 hours.   Comment 1 Notify RN    Comment 2 Document in Chart   Urinalysis, Complete w Microscopic     Status: Abnormal   Collection Time: 07/11/19  1:21 PM  Result Value Ref Range   Color, Urine COLORLESS (A) YELLOW   APPearance CLEAR (A) CLEAR   Specific Gravity, Urine 1.005 1.005 - 1.030   pH 8.0 5.0 - 8.0   Glucose, UA NEGATIVE NEGATIVE mg/dL   Hgb urine dipstick NEGATIVE NEGATIVE   Bilirubin Urine NEGATIVE NEGATIVE   Ketones, ur NEGATIVE NEGATIVE mg/dL   Protein, ur NEGATIVE NEGATIVE mg/dL   Nitrite NEGATIVE NEGATIVE   Leukocytes,Ua NEGATIVE NEGATIVE   RBC / HPF 0-5 0 - 5 RBC/hpf   WBC, UA NONE SEEN 0 - 5 WBC/hpf   Bacteria,  UA RARE (A) NONE SEEN   Squamous Epithelial / LPF 0-5 0 - 5    Comment: Performed at [facility], [address] [place], [place] [address]  Urine Culture     Status: Abnormal   Collection Time: 07/11/19  1:21 PM   Specimen: Urine, Random  Result Value Ref Range   Specimen Description      URINE, RANDOM Performed at [facility], [address] [place], [place] [address]    Special Requests      NONE Performed at [facility], [place], [place] [address]    Culture 10,000 COLONIES/mL ESCHERICHIA COLI (A)    Report Status 07/13/2019 FINAL    Organism ID, Bacteria ESCHERICHIA COLI (A)       Susceptibility   Escherichia coli - MIC*    AMPICILLIN 4 SENSITIVE Sensitive     CEFAZOLIN <=4 SENSITIVE Sensitive     CEFTRIAXONE <=0.25 SENSITIVE Sensitive     CIPROFLOXACIN <=0.25 SENSITIVE Sensitive     GENTAMICIN <=1 SENSITIVE Sensitive     IMIPENEM <=0.25 SENSITIVE Sensitive     NITROFURANTOIN <=16 SENSITIVE Sensitive     TRIMETH/SULFA <=20 SENSITIVE Sensitive     AMPICILLIN/SULBACTAM <=2 SENSITIVE Sensitive     PIP/TAZO <=4 SENSITIVE Sensitive     * 10,000 COLONIES/mL ESCHERICHIA COLI  Glucose, capillary     Status: Abnormal   Collection Time: 07/11/19  4:40 PM  Result Value Ref Range   Glucose-Capillary 179 (H) 70 - 99 mg/dL    Comment: Glucose reference range applies only to samples taken after fasting for at least 8 hours.   Comment 1 Notify RN    Comment 2 Document in Chart   Glucose, capillary     Status: Abnormal   Collection Time: 07/11/19  9:35 PM  Result Value Ref Range   Glucose-Capillary 153 (H) 70 - 99 mg/dL    Comment: Glucose reference range applies only to samples taken after fasting for at least 8 hours.   Comment 1 Notify RN   CBC     Status: Abnormal   Collection Time: 07/12/19  6:18 AM  Result Value Ref Range   WBC 9.6 4.0 - 10.5 K/uL   RBC 3.32 (L) 3.87 - 5.11 MIL/uL   Hemoglobin 10.2 (L) 12.0 - 15.0 g/dL    HCT 30.2 (L) 36 - 46 %   MCV 91.0 80.0 - 100.0 fL   MCH 30.7 26.0 - 34.0 pg   MCHC 33.8 30.0 - 36.0 g/dL   RDW 12.7 11.5 - 15.5 %   Platelets 388 150 - 400 K/uL   nRBC 0.0 0.0 - 0.2 %  Comment: Performed at [facility], [address] [place], [place] [address]  Basic metabolic panel     Status: Abnormal   Collection Time: 07/12/19  6:18 AM  Result Value Ref Range   Sodium 131 (L) 135 - 145 mmol/L   Potassium 3.7 3.5 - 5.1 mmol/L   Chloride 96 (L) 98 - 111 mmol/L   CO2 25 22 - 32 mmol/L   Glucose, Bld 116 (H) 70 - 99 mg/dL    Comment: Glucose reference range applies only to samples taken after fasting for at least 8 hours.   BUN 12 8 - 23 mg/dL   Creatinine, Ser 1.12 (H) 0.44 - 1.00 mg/dL   Calcium 9.0 8.9 - 10.3 mg/dL   GFR calc non Af Amer 44 (L) >60 mL/min   GFR calc Af Amer 51 (L) >60 mL/min   Anion gap 10 5 - 15    Comment: Performed at [facility], [address] [place], [place] [address]  Glucose, capillary     Status: Abnormal   Collection Time: 07/12/19  7:49 AM  Result Value Ref Range   Glucose-Capillary 117 (H) 70 - 99 mg/dL    Comment: Glucose reference range applies only to samples taken after fasting for at least 8 hours.   Comment 1 Notify RN    Comment 2 Document in Chart   Glucose, capillary     Status: Abnormal   Collection Time: 07/12/19 11:32 AM  Result Value Ref Range   Glucose-Capillary 249 (H) 70 - 99 mg/dL    Comment: Glucose reference range applies only to samples taken after fasting for at least 8 hours.   Comment 1 Notify RN    Comment 2 Document in Chart   Glucose, capillary     Status: Abnormal   Collection Time: 07/12/19  4:52 PM  Result Value Ref Range   Glucose-Capillary 198 (H) 70 - 99 mg/dL    Comment: Glucose reference range applies only to samples taken after fasting for at least 8 hours.   Comment 1 Notify RN    Comment 2 Document in Chart   Glucose, capillary     Status: Abnormal   Collection Time: 07/12/19   9:13 PM  Result Value Ref Range   Glucose-Capillary 176 (H) 70 - 99 mg/dL    Comment: Glucose reference range applies only to samples taken after fasting for at least 8 hours.   Comment 1 Notify RN   CBC     Status: Abnormal   Collection Time: 07/13/19  5:41 AM  Result Value Ref Range   WBC 8.8 4.0 - 10.5 K/uL   RBC 3.33 (L) 3.87 - 5.11 MIL/uL   Hemoglobin 10.3 (L) 12.0 - 15.0 g/dL   HCT 29.6 (L) 36 - 46 %   MCV 88.9 80.0 - 100.0 fL   MCH 30.9 26.0 - 34.0 pg   MCHC 34.8 30.0 - 36.0 g/dL   RDW 12.6 11.5 - 15.5 %   Platelets 403 (H) 150 - 400 K/uL   nRBC 0.0 0.0 - 0.2 %    Comment: Performed at [facility], [address], [place], [place] [address]  Basic metabolic panel     Status: Abnormal   Collection Time: 07/13/19  5:41 AM  Result Value Ref Range   Sodium 132 (L) 135 - 145 mmol/L   Potassium 4.7 3.5 - 5.1 mmol/L   Chloride 95 (L) 98 - 111 mmol/L   CO2 28 22 - 32 mmol/L  Glucose, Bld 129 (H) 70 - 99 mg/dL    Comment: Glucose reference range applies only to samples taken after fasting for at least 8 hours.   BUN 14 8 - 23 mg/dL   Creatinine, Ser 1.09 (H) 0.44 - 1.00 mg/dL   Calcium 9.1 8.9 - 10.3 mg/dL   GFR calc non Af Amer 46 (L) >60 mL/min   GFR calc Af Amer 53 (L) >60 mL/min   Anion gap 9 5 - 15    Comment: Performed at [facility], [address] [place], [place] [address]  Glucose, capillary     Status: Abnormal   Collection Time: 07/13/19  8:11 AM  Result Value Ref Range   Glucose-Capillary 122 (H) 70 - 99 mg/dL    Comment: Glucose reference range applies only to samples taken after fasting for at least 8 hours.  Glucose, capillary     Status: Abnormal   Collection Time: 07/13/19 11:27 AM  Result Value Ref Range   Glucose-Capillary 185 (H) 70 - 99 mg/dL    Comment: Glucose reference range applies only to samples taken after fasting for at least 8 hours.  CBC     Status: Abnormal   Collection Time: 07/17/19  6:43 AM  Result Value Ref  Range   WBC 8.7 4.0 - 10.5 K/uL   RBC 3.37 (L) 3.87 - 5.11 MIL/uL   Hemoglobin 10.4 (L) 12.0 - 15.0 g/dL   HCT 30.0 (L) 36 - 46 %   MCV 89.0 80.0 - 100.0 fL   MCH 30.9 26.0 - 34.0 pg   MCHC 34.7 30.0 - 36.0 g/dL   RDW 12.9 11.5 - 15.5 %   Platelets 401 (H) 150 - 400 K/uL   nRBC 0.0 0.0 - 0.2 %    Comment: Performed at [facility], [address] [place], [place] [address]  Comprehensive metabolic panel     Status: Abnormal   Collection Time: 07/17/19  6:43 AM  Result Value Ref Range   Sodium 127 (L) 135 - 145 mmol/L   Potassium 4.4 3.5 - 5.1 mmol/L   Chloride 90 (L) 98 - 111 mmol/L   CO2 24 22 - 32 mmol/L   Glucose, Bld 130 (H) 70 - 99 mg/dL    Comment: Glucose reference range applies only to samples taken after fasting for at least 8 hours.   BUN 19 8 - 23 mg/dL   Creatinine, Ser 1.21 (H) 0.44 - 1.00 mg/dL   Calcium 9.5 8.9 - 10.3 mg/dL   Total Protein 6.8 6.5 - 8.1 g/dL   Albumin 3.9 3.5 - 5.0 g/dL   AST 22 15 - 41 U/L   ALT 15 0 - 44 U/L   Alkaline Phosphatase 106 38 - 126 U/L   Total Bilirubin 0.7 0.3 - 1.2 mg/dL   GFR calc non Af Amer 40 (L) >60 mL/min   GFR calc Af Amer 47 (L) >60 mL/min   Anion gap 13 5 - 15    Comment: Performed at [facility], [address] [place], [place] [address]  Sedimentation rate     Status: None   Collection Time: 07/17/19  6:43 AM  Result Value Ref Range   Sed Rate 14 0 - 30 mm/hr    Comment: Performed at [facility], [address] [place], [place] [address]  CBC with Differential/Platelet     Status: Abnormal   Collection Time: 07/17/19  6:43 AM  Result Value Ref Range   WBC  8.8 4.0 - 10.5 K/uL   RBC 3.38 (L) 3.87 - 5.11 MIL/uL   Hemoglobin 10.5 (L) 12.0 - 15.0 g/dL   HCT 31.0 (L) 36 - 46 %   MCV 91.7 80.0 - 100.0 fL   MCH 31.1 26.0 - 34.0 pg   MCHC 33.9 30.0 - 36.0 g/dL   RDW 13.2 11.5 - 15.5 %   Platelets 535 (H) 150 - 400 K/uL   nRBC 0.0 0.0 - 0.2 %   Neutrophils Relative % 69 %   Neutro  Abs 6.1 1.7 - 7.7 K/uL   Lymphocytes Relative 21 %   Lymphs Abs 1.9 0.7 - 4.0 K/uL   Monocytes Relative 8 %   Monocytes Absolute 0.7 0 - 1 K/uL   Eosinophils Relative 1 %   Eosinophils Absolute 0.1 0 - 0 K/uL   Basophils Relative 1 %   Basophils Absolute 0.0 0 - 0 K/uL   Immature Granulocytes 0 %   Abs Immature Granulocytes 0.03 0.00 - 0.07 K/uL    Comment: Performed at [facility], [address] [place], [place] [address]  Urinalysis, Complete w Microscopic     Status: Abnormal   Collection Time: 07/17/19 11:43 AM  Result Value Ref Range   Color, Urine STRAW (A) YELLOW   APPearance CLEAR (A) CLEAR   Specific Gravity, Urine 1.005 1.005 - 1.030   pH 8.0 5.0 - 8.0   Glucose, UA NEGATIVE NEGATIVE mg/dL   Hgb urine dipstick NEGATIVE NEGATIVE   Bilirubin Urine NEGATIVE NEGATIVE   Ketones, ur 5 (A) NEGATIVE mg/dL   Protein, ur NEGATIVE NEGATIVE mg/dL   Nitrite NEGATIVE NEGATIVE   Leukocytes,Ua NEGATIVE NEGATIVE   RBC / HPF 0-5 0 - 5 RBC/hpf   WBC, UA NONE SEEN 0 - 5 WBC/hpf   Bacteria, UA NONE SEEN NONE SEEN   Squamous Epithelial / LPF 0-5 0 - 5    Comment: Performed at [facility], [address] [place], [place] [address]  Glucose, capillary     Status: Abnormal   Collection Time: 07/17/19 11:55 AM  Result Value Ref Range   Glucose-Capillary 128 (H) 70 - 99 mg/dL    Comment: Glucose reference range applies only to samples taken after fasting for at least 8 hours.   Comment 1 Notify RN   Glucose, capillary     Status: Abnormal   Collection Time: 07/17/19  6:07 PM  Result Value Ref Range   Glucose-Capillary 105 (H) 70 - 99 mg/dL    Comment: Glucose reference range applies only to samples taken after fasting for at least 8 hours.  SARS CORONAVIRUS 2 (TAT 6-24 HRS) Nasopharyngeal Nasopharyngeal Swab     Status: None   Collection Time: 07/17/19  6:42 PM   Specimen: Nasopharyngeal Swab  Result Value Ref Range   SARS Coronavirus 2 NEGATIVE NEGATIVE     Comment: (NOTE) SARS-CoV-2 target nucleic acids are NOT DETECTED.  The SARS-CoV-2 RNA is generally detectable in upper and lower respiratory specimens during the acute phase of infection. Negative results do not preclude SARS-CoV-2 infection, do not rule out co-infections with other pathogens, and should not be used as the sole basis for treatment or other patient management decisions. Negative results must be combined with clinical observations, patient history, and epidemiological information. The expected result is Negative.  Fact Sheet for Patients: SugarRoll.be  Fact Sheet for Healthcare Providers: https://www.woods-mathews.com/  This test is not yet approved or cleared by the [place] FDA and  has been  authorized for detection and/or diagnosis of SARS-CoV-2 by FDA under an Emergency Use Authorization (EUA). This EUA will remain  in effect (meaning this test can be used) for the duration of the COVID-19 declaration under Se ction 564(b)(1) of the Act, 21 U.S.C. section 360bbb-3(b)(1), unless the authorization is terminated or revoked sooner.  Performed at [facility], [place] [address], [place], [place] [address]   Glucose, capillary     Status: Abnormal   Collection Time: 07/17/19  9:13 PM  Result Value Ref Range   Glucose-Capillary 148 (H) 70 - 99 mg/dL    Comment: Glucose reference range applies only to samples taken after fasting for at least 8 hours.  CBC     Status: Abnormal   Collection Time: 07/18/19  4:35 AM  Result Value Ref Range   WBC 8.2 4.0 - 10.5 K/uL   RBC 3.08 (L) 3.87 - 5.11 MIL/uL   Hemoglobin 9.6 (L) 12.0 - 15.0 g/dL   HCT 28.3 (L) 36 - 46 %   MCV 91.9 80.0 - 100.0 fL   MCH 31.2 26.0 - 34.0 pg   MCHC 33.9 30.0 - 36.0 g/dL   RDW 13.2 11.5 - 15.5 %   Platelets 474 (H) 150 - 400 K/uL   nRBC 0.0 0.0 - 0.2 %    Comment: Performed at [facility], [address], [place], [place] [address]   Basic metabolic panel     Status: Abnormal   Collection Time: 07/18/19  4:35 AM  Result Value Ref Range   Sodium 129 (L) 135 - 145 mmol/L   Potassium 4.5 3.5 - 5.1 mmol/L   Chloride 94 (L) 98 - 111 mmol/L   CO2 25 22 - 32 mmol/L   Glucose, Bld 117 (H) 70 - 99 mg/dL    Comment: Glucose reference range applies only to samples taken after fasting for at least 8 hours.   BUN 22 8 - 23 mg/dL   Creatinine, Ser 1.24 (H) 0.44 - 1.00 mg/dL   Calcium 9.0 8.9 - 10.3 mg/dL   GFR calc non Af Amer 39 (L) >60 mL/min   GFR calc Af Amer 45 (L) >60 mL/min   Anion gap 10 5 - 15    Comment: Performed at [facility], [address] [place],  [address]  Glucose, capillary     Status: Abnormal   Collection Time: 07/18/19  8:03 AM  Result Value Ref Range   Glucose-Capillary 126 (H) 70 - 99 mg/dL    Comment: Glucose reference range applies only to samples taken after fasting for at least 8 hours.  Glucose, capillary     Status: Abnormal   Collection Time: 07/18/19 11:19 AM  Result Value Ref Range   Glucose-Capillary 237 (H) 70 - 99 mg/dL    Comment: Glucose reference range applies only to samples taken after fasting for at least 8 hours.  Glucose, capillary     Status: Abnormal   Collection Time: 07/18/19  5:07 PM  Result Value Ref Range   Glucose-Capillary 123 (H) 70 - 99 mg/dL    Comment: Glucose reference range applies only to samples taken after fasting for at least 8 hours.  Glucose, capillary     Status: Abnormal   Collection Time: 07/18/19  8:15 PM  Result Value Ref Range   Glucose-Capillary 139 (H) 70 - 99 mg/dL    Comment: Glucose reference range applies only to samples taken after fasting for at least 8 hours.  Glucose, capillary  Status: Abnormal   Collection Time: 07/19/19  8:37 AM  Result Value Ref Range   Glucose-Capillary 131 (H) 70 - 99 mg/dL    Comment: Glucose reference range applies only to samples taken after fasting for at least 8 hours.  Basic metabolic  panel     Status: Abnormal   Collection Time: 07/19/19  8:55 AM  Result Value Ref Range   Sodium 123 (L) 135 - 145 mmol/L   Potassium 4.6 3.5 - 5.1 mmol/L   Chloride 89 (L) 98 - 111 mmol/L   CO2 23 22 - 32 mmol/L   Glucose, Bld 234 (H) 70 - 99 mg/dL    Comment: Glucose reference range applies only to samples taken after fasting for at least 8 hours.   BUN 21 8 - 23 mg/dL   Creatinine, Ser 1.30 (H) 0.44 - 1.00 mg/dL   Calcium 8.8 (L) 8.9 - 10.3 mg/dL   GFR calc non Af Amer 37 (L) >60 mL/min   GFR calc Af Amer 43 (L) >60 mL/min   Anion gap 11 5 - 15    Comment: Performed at [facility], [address] [place], [place] [address]  Glucose, capillary     Status: Abnormal   Collection Time: 07/19/19 11:41 AM  Result Value Ref Range   Glucose-Capillary 188 (H) 70 - 99 mg/dL    Comment: Glucose reference range applies only to samples taken after fasting for at least 8 hours.  Glucose, capillary     Status: Abnormal   Collection Time: 07/19/19  4:36 PM  Result Value Ref Range   Glucose-Capillary 115 (H) 70 - 99 mg/dL    Comment: Glucose reference range applies only to samples taken after fasting for at least 8 hours.  Glucose, capillary     Status: Abnormal   Collection Time: 07/19/19 10:28 PM  Result Value Ref Range   Glucose-Capillary 141 (H) 70 - 99 mg/dL    Comment: Glucose reference range applies only to samples taken after fasting for at least 8 hours.  Glucose, capillary     Status: Abnormal   Collection Time: 07/20/19  7:59 AM  Result Value Ref Range   Glucose-Capillary 110 (H) 70 - 99 mg/dL    Comment: Glucose reference range applies only to samples taken after fasting for at least 8 hours.  Basic metabolic panel     Status: Abnormal   Collection Time: 07/20/19  9:13 AM  Result Value Ref Range   Sodium 127 (L) 135 - 145 mmol/L   Potassium 4.6 3.5 - 5.1 mmol/L   Chloride 91 (L) 98 - 111 mmol/L   CO2 27 22 - 32 mmol/L   Glucose, Bld 126 (H) 70 - 99 mg/dL     Comment: Glucose reference range applies only to samples taken after fasting for at least 8 hours.   BUN 26 (H) 8 - 23 mg/dL   Creatinine, Ser 1.26 (H) 0.44 - 1.00 mg/dL   Calcium 9.0 8.9 - 10.3 mg/dL   GFR calc non Af Amer 38 (L) >60 mL/min   GFR calc Af Amer 44 (L) >60 mL/min   Anion gap 9 5 - 15    Comment: Performed at [facility], [address] [place], [place] [address]  Glucose, capillary     Status: Abnormal   Collection Time: 07/20/19 11:46 AM  Result Value Ref Range   Glucose-Capillary 227 (H) 70 - 99 mg/dL    Comment: Glucose reference range applies only to samples  taken after fasting for at least 8 hours.  Glucose, capillary     Status: Abnormal   Collection Time: 07/20/19  5:06 PM  Result Value Ref Range   Glucose-Capillary 232 (H) 70 - 99 mg/dL    Comment: Glucose reference range applies only to samples taken after fasting for at least 8 hours.  Glucose, capillary     Status: Abnormal   Collection Time: 07/20/19  9:15 PM  Result Value Ref Range   Glucose-Capillary 129 (H) 70 - 99 mg/dL    Comment: Glucose reference range applies only to samples taken after fasting for at least 8 hours.  Basic metabolic panel     Status: Abnormal   Collection Time: 07/21/19  4:08 AM  Result Value Ref Range   Sodium 128 (L) 135 - 145 mmol/L   Potassium 4.7 3.5 - 5.1 mmol/L   Chloride 90 (L) 98 - 111 mmol/L   CO2 27 22 - 32 mmol/L   Glucose, Bld 151 (H) 70 - 99 mg/dL    Comment: Glucose reference range applies only to samples taken after fasting for at least 8 hours.   BUN 33 (H) 8 - 23 mg/dL   Creatinine, Ser 1.48 (H) 0.44 - 1.00 mg/dL   Calcium 9.1 8.9 - 10.3 mg/dL   GFR calc non Af Amer 32 (L) >60 mL/min   GFR calc Af Amer 37 (L) >60 mL/min   Anion gap 11 5 - 15    Comment: Performed at [facility], [address] [place], [place] [address]  Glucose, capillary     Status: Abnormal   Collection Time: 07/21/19  7:53 AM  Result Value Ref Range    Glucose-Capillary 123 (H) 70 - 99 mg/dL    Comment: Glucose reference range applies only to samples taken after fasting for at least 8 hours.  Glucose, capillary     Status: Abnormal   Collection Time: 07/21/19 11:38 AM  Result Value Ref Range   Glucose-Capillary 131 (H) 70 - 99 mg/dL    Comment: Glucose reference range applies only to samples taken after fasting for at least 8 hours.  Glucose, capillary     Status: Abnormal   Collection Time: 07/21/19  5:42 PM  Result Value Ref Range   Glucose-Capillary 155 (H) 70 - 99 mg/dL    Comment: Glucose reference range applies only to samples taken after fasting for at least 8 hours.  Glucose, capillary     Status: Abnormal   Collection Time: 07/21/19  9:59 PM  Result Value Ref Range   Glucose-Capillary 172 (H) 70 - 99 mg/dL    Comment: Glucose reference range applies only to samples taken after fasting for at least 8 hours.  Basic metabolic panel     Status: Abnormal   Collection Time: 07/22/19  5:32 AM  Result Value Ref Range   Sodium 124 (L) 135 - 145 mmol/L   Potassium 5.6 (H) 3.5 - 5.1 mmol/L   Chloride 86 (L) 98 - 111 mmol/L   CO2 27 22 - 32 mmol/L   Glucose, Bld 165 (H) 70 - 99 mg/dL    Comment: Glucose reference range applies only to samples taken after fasting for at least 8 hours.   BUN 54 (H) 8 - 23 mg/dL   Creatinine, Ser 1.75 (H) 0.44 - 1.00 mg/dL   Calcium 9.0 8.9 - 10.3 mg/dL   GFR calc non Af Amer 26 (L) >60 mL/min   GFR calc Af Amer 30 (L) >60  mL/min   Anion gap 11 5 - 15    Comment: Performed at [facility], [address] [place], [place] [address]  Glucose, capillary     Status: Abnormal   Collection Time: 07/22/19  7:47 AM  Result Value Ref Range   Glucose-Capillary 144 (H) 70 - 99 mg/dL    Comment: Glucose reference range applies only to samples taken after fasting for at least 8 hours.  Glucose, capillary     Status: Abnormal   Collection Time: 07/22/19 11:40 AM  Result Value Ref Range    Glucose-Capillary 227 (H) 70 - 99 mg/dL    Comment: Glucose reference range applies only to samples taken after fasting for at least 8 hours.  Glucose, capillary     Status: Abnormal   Collection Time: 07/22/19  4:26 PM  Result Value Ref Range   Glucose-Capillary 130 (H) 70 - 99 mg/dL    Comment: Glucose reference range applies only to samples taken after fasting for at least 8 hours.  Basic metabolic panel     Status: Abnormal   Collection Time: 07/22/19  6:41 PM  Result Value Ref Range   Sodium 126 (L) 135 - 145 mmol/L   Potassium 5.3 (H) 3.5 - 5.1 mmol/L   Chloride 89 (L) 98 - 111 mmol/L   CO2 28 22 - 32 mmol/L   Glucose, Bld 149 (H) 70 - 99 mg/dL    Comment: Glucose reference range applies only to samples taken after fasting for at least 8 hours.   BUN 55 (H) 8 - 23 mg/dL   Creatinine, Ser 1.58 (H) 0.44 - 1.00 mg/dL   Calcium 8.8 (L) 8.9 - 10.3 mg/dL   GFR calc non Af Amer 29 (L) >60 mL/min   GFR calc Af Amer 34 (L) >60 mL/min   Anion gap 9 5 - 15    Comment: Performed at [facility], [address] [place], [place] [address]  Glucose, capillary     Status: Abnormal   Collection Time: 07/22/19  9:22 PM  Result Value Ref Range   Glucose-Capillary 145 (H) 70 - 99 mg/dL    Comment: Glucose reference range applies only to samples taken after fasting for at least 8 hours.  Basic metabolic panel     Status: Abnormal   Collection Time: 07/23/19  4:18 AM  Result Value Ref Range   Sodium 129 (L) 135 - 145 mmol/L   Potassium 4.6 3.5 - 5.1 mmol/L   Chloride 92 (L) 98 - 111 mmol/L   CO2 28 22 - 32 mmol/L   Glucose, Bld 157 (H) 70 - 99 mg/dL    Comment: Glucose reference range applies only to samples taken after fasting for at least 8 hours.   BUN 47 (H) 8 - 23 mg/dL   Creatinine, Ser 1.37 (H) 0.44 - 1.00 mg/dL   Calcium 9.0 8.9 - 10.3 mg/dL   GFR calc non Af Amer 35 (L) >60 mL/min   GFR calc Af Amer 40 (L) >60 mL/min   Anion gap 9 5 - 15    Comment: Performed at  [facility], [address] [place], [place] [address]  Glucose, capillary     Status: Abnormal   Collection Time: 07/23/19  7:28 AM  Result Value Ref Range   Glucose-Capillary 135 (H) 70 - 99 mg/dL    Comment: Glucose reference range applies only to samples taken after fasting for at least 8 hours.  Glucose, capillary  Status: Abnormal   Collection Time: 07/23/19 11:49 AM  Result Value Ref Range   Glucose-Capillary 168 (H) 70 - 99 mg/dL    Comment: Glucose reference range applies only to samples taken after fasting for at least 8 hours.  Glucose, capillary     Status: Abnormal   Collection Time: 07/23/19  4:12 PM  Result Value Ref Range   Glucose-Capillary 108 (H) 70 - 99 mg/dL    Comment: Glucose reference range applies only to samples taken after fasting for at least 8 hours.  Glucose, capillary     Status: Abnormal   Collection Time: 07/23/19  9:20 PM  Result Value Ref Range   Glucose-Capillary 133 (H) 70 - 99 mg/dL    Comment: Glucose reference range applies only to samples taken after fasting for at least 8 hours.  Basic metabolic panel     Status: Abnormal   Collection Time: 07/24/19  6:08 AM  Result Value Ref Range   Sodium 130 (L) 135 - 145 mmol/L   Potassium 4.3 3.5 - 5.1 mmol/L   Chloride 96 (L) 98 - 111 mmol/L   CO2 25 22 - 32 mmol/L   Glucose, Bld 103 (H) 70 - 99 mg/dL    Comment: Glucose reference range applies only to samples taken after fasting for at least 8 hours.   BUN 30 (H) 8 - 23 mg/dL   Creatinine, Ser 1.07 (H) 0.44 - 1.00 mg/dL   Calcium 9.1 8.9 - 10.3 mg/dL   GFR calc non Af Amer 47 (L) >60 mL/min   GFR calc Af Amer 54 (L) >60 mL/min   Anion gap 9 5 - 15    Comment: Performed at [facility], [address] [place], [place] [address]  Glucose, capillary     Status: None   Collection Time: 07/24/19  7:49 AM  Result Value Ref Range   Glucose-Capillary 91 70 - 99 mg/dL    Comment: Glucose reference range applies only to samples  taken after fasting for at least 8 hours.  Glucose, capillary     Status: Abnormal   Collection Time: 07/24/19 12:01 PM  Result Value Ref Range   Glucose-Capillary 170 (H) 70 - 99 mg/dL    Comment: Glucose reference range applies only to samples taken after fasting for at least 8 hours.  Glucose, capillary     Status: None   Collection Time: 07/24/19  4:23 PM  Result Value Ref Range   Glucose-Capillary 73 70 - 99 mg/dL    Comment: Glucose reference range applies only to samples taken after fasting for at least 8 hours.  Basic metabolic panel     Status: Abnormal   Collection Time: 07/26/19 12:00 AM  Result Value Ref Range   Glucose 75    BUN 21 4 - 21   CO2 20 13 - 22   Creatinine 0.9 0.5 - 1.1   Potassium 3.8 3.4 - 5.3   Sodium 126 (A) 137 - 147   Chloride 89 (A) 99 - 108  Comprehensive metabolic panel     Status: None   Collection Time: 07/26/19 12:00 AM  Result Value Ref Range   Calcium 9.3 8.7 - 10.7   Albumin 3.8 3.5 - 5.0  Hepatic function panel     Status: Abnormal   Collection Time: 07/26/19 12:00 AM  Result Value Ref Range   ALT 18 7 - 35   AST 41 (A) 13 - 35  CBC and differential  Status: Abnormal   Collection Time: 07/26/19 12:00 AM  Result Value Ref Range   Hemoglobin 9.4 (A) 12.0 - 16.0   HCT 28 (A) 36 - 46   WBC 11.7   CBC and differential     Status: Abnormal   Collection Time: 07/26/19 12:00 AM  Result Value Ref Range   Platelets 536 (A) 150 - 399  Urine Culture     Status: None   Collection Time: 07/26/19 12:00 AM   Specimen: Urine  Result Value Ref Range   Urine Culture Result Klebsiella >100CFU     Comment: Resistant to Ampicillin. On Augmentin treatment.  Osmolality     Status: Abnormal   Collection Time: 07/26/19 12:00 AM  Result Value Ref Range   Osmolality 254.9 (A) 285 - 923  Basic metabolic panel     Status: Abnormal   Collection Time: 07/27/19 12:00 AM  Result Value Ref Range   Glucose 89    BUN 20 4 - 21   CO2 20 13 - 22    Creatinine 0.9 0.5 - 1.1   Potassium 4.4 3.4 - 5.3   Sodium 121 (A) 137 - 147   Chloride 89 (A) 99 - 108  Comprehensive metabolic panel     Status: None   Collection Time: 07/27/19 12:00 AM  Result Value Ref Range   Calcium 9.2 8.7 - 10.7   Albumin 3.6 3.5 - 5.0  Basic metabolic panel     Status: Abnormal   Collection Time: 07/28/19 12:00 AM  Result Value Ref Range   Glucose 88    BUN 21 4 - 21   CO2 21 13 - 22   Creatinine 0.9 0.5 - 1.1   Potassium 4.3 3.4 - 5.3   Sodium 124 (A) 137 - 147   Chloride 89 (A) 99 - 108  COMPLETE METABOLIC PANEL WITH GFR     Status: Abnormal   Collection Time: 08/08/19  9:02 AM  Result Value Ref Range   Glucose, Bld 287 (H) 65 - 99 mg/dL    Comment: .            Fasting reference interval . For someone without known diabetes, a glucose value >125 mg/dL indicates that they may have diabetes and this should be confirmed with a follow-up test. .    BUN 15 7 - 25 mg/dL   Creat 1.23 (H) 0.60 - 0.88 mg/dL    Comment: For patients >29 years of age, the reference limit for Creatinine is approximately 13% higher for people identified as African-American. .    GFR, Est Non African American 39 (L) > OR = 60 mL/min/1.45m2   GFR, Est African American 46 (L) > OR = 60 mL/min/1.38m2   BUN/Creatinine Ratio 12 6 - 22 (calc)   Sodium 127 (L) 135 - 146 mmol/L   Potassium 5.2 3.5 - 5.3 mmol/L   Chloride 92 (L) 98 - 110 mmol/L   CO2 23 20 - 32 mmol/L   Calcium 9.5 8.6 - 10.4 mg/dL   Total Protein 6.3 6.1 - 8.1 g/dL   Albumin 4.0 3.6 - 5.1 g/dL   Globulin 2.3 1.9 - 3.7 g/dL (calc)   AG Ratio 1.7 1.0 - 2.5 (calc)   Total Bilirubin 0.5 0.2 - 1.2 mg/dL   Alkaline phosphatase (APISO) 90 37 - 153 U/L   AST 16 10 - 35 U/L   ALT 13 6 - 29 U/L  CBC with Differential/Platelet     Status: Abnormal  Collection Time: 08/08/19  9:02 AM  Result Value Ref Range   WBC 10.8 3.8 - 10.8 Thousand/uL   RBC 3.15 (L) 3.80 - 5.10 Million/uL   Hemoglobin 9.9 (L) 11.7 -  15.5 g/dL   HCT 30.4 (L) 35 - 45 %   MCV 96.5 80.0 - 100.0 fL   MCH 31.4 27.0 - 33.0 pg   MCHC 32.6 32.0 - 36.0 g/dL   RDW 13.0 11.0 - 15.0 %   Platelets 487 (H) 140 - 400 Thousand/uL   MPV 8.0 7.5 - 12.5 fL   Neutro Abs 6,847 1,500 - 7,800 cells/uL   Lymphs Abs 2,948 850 - 3,900 cells/uL   Absolute Monocytes 832 200 - 950 cells/uL   Eosinophils Absolute 119 15 - 500 cells/uL   Basophils Absolute 54 0 - 200 cells/uL   Neutrophils Relative % 63.4 %   Total Lymphocyte 27.3 %   Monocytes Relative 7.7 %   Eosinophils Relative 1.1 %   Basophils Relative 0.5 %  Urinalysis, Complete     Status: Abnormal   Collection Time: 08/14/19  2:54 PM  Result Value Ref Range   Specific Gravity, UA 1.015 1.005 - 1.030   pH, UA 5.5 5.0 - 7.5   Color, UA Yellow Yellow   Appearance Ur Cloudy (A) Clear   Leukocytes,UA 1+ (A) Negative   Protein,UA Trace (A) Negative/Trace   Glucose, UA Negative Negative   Ketones, UA Negative Negative   RBC, UA Negative Negative   Bilirubin, UA Negative Negative   Urobilinogen, Ur 0.2 0.2 - 1.0 mg/dL   Nitrite, UA Positive (A) Negative   Microscopic Examination See below:   CULTURE, URINE COMPREHENSIVE     Status: Abnormal   Collection Time: 08/14/19  2:54 PM   Specimen: Urine   UR  Result Value Ref Range   Urine Culture, Comprehensive Final report (A)    Organism ID, Bacteria Escherichia coli (A)     Comment: Multi-Drug Resistant Organism Greater than 100,000 colony forming units per mL Cefazolin with an MIC <=16 predicts susceptibility to the oral agents cefaclor, cefdinir, cefpodoxime, cefprozil, cefuroxime, cephalexin, and loracarbef when used for therapy of uncomplicated urinary tract infections due to E. coli, Klebsiella pneumoniae, and Proteus mirabilis.    ANTIMICROBIAL SUSCEPTIBILITY Comment     Comment:       ** S = Susceptible; I = Intermediate; R = Resistant **                    P = Positive; N = Negative             MICS are expressed in  micrograms per mL    Antibiotic                 RSLT#1    RSLT#2    RSLT#3    RSLT#4 Amoxicillin/Clavulanic Acid    I Ampicillin                     R Cefazolin                      S Cefepime                       S Ceftriaxone                    S Cefuroxime  S Ciprofloxacin                  S Ertapenem                      S Gentamicin                     S Imipenem                       S Levofloxacin                   S Meropenem                      S Nitrofurantoin                 S Piperacillin/Tazobactam        R Tetracycline                   R Tobramycin                     S Trimethoprim/Sulfa             R   Microscopic Examination     Status: Abnormal   Collection Time: 08/14/19  2:54 PM   Urine  Result Value Ref Range   WBC, UA >30 (A) 0 - 5 /hpf   RBC 0-2 0 - 2 /hpf   Epithelial Cells (non renal) 0-10 0 - 10 /hpf   Renal Epithel, UA 0-10 (A) None seen /hpf   Bacteria, UA Many (A) None seen/Few  T4, free     Status: None   Collection Time: 09/06/19  8:08 AM  Result Value Ref Range   Free T4 1.3 0.8 - 1.8 ng/dL  TSH     Status: Abnormal   Collection Time: 09/06/19  8:08 AM  Result Value Ref Range   TSH 5.61 (H) 0.40 - 4.50 mIU/L  Lipid panel     Status: Abnormal   Collection Time: 09/06/19  8:08 AM  Result Value Ref Range   Cholesterol 168 <200 mg/dL   HDL 66 > OR = 50 mg/dL   Triglycerides 173 (H) <150 mg/dL   LDL Cholesterol (Calc) 75 mg/dL (calc)    Comment: Reference range: <100 . Desirable range <100 mg/dL for primary prevention;   <70 mg/dL for patients with CHD or diabetic patients  with > or = 2 CHD risk factors. Marland Kitchen LDL-C is now calculated using the Martin-Hopkins  calculation, which is a validated novel method providing  better accuracy than the Friedewald equation in the  estimation of LDL-C.  Cresenciano Genre et al. Annamaria Helling. 6962;952(84): 2061-2068  (http://education.[facility].com/faq/FAQ164)    Total CHOL/HDL Ratio 2.5  <5.0 (calc)   Non-HDL Cholesterol (Calc) 102 <130 mg/dL (calc)    Comment: For patients with diabetes plus 1 major ASCVD risk  factor, treating to a non-HDL-C goal of <100 mg/dL  (LDL-C of <70 mg/dL) is considered a therapeutic  option.   COMPLETE METABOLIC PANEL WITH GFR     Status: Abnormal   Collection Time: 09/06/19  8:08 AM  Result Value Ref Range   Glucose, Bld 116 (H) 65 - 99 mg/dL    Comment: .            Fasting reference interval . For someone without known diabetes, a glucose value between 100 and 125 mg/dL is consistent  with prediabetes and should be confirmed with a follow-up test. .    BUN 15 7 - 25 mg/dL   Creat 0.94 (H) 0.60 - 0.88 mg/dL    Comment: For patients >50 years of age, the reference limit for Creatinine is approximately 13% higher for people identified as African-American. .    GFR, Est Non African American 54 (L) > OR = 60 mL/min/1.19m2   GFR, Est African American 63 > OR = 60 mL/min/1.39m2   BUN/Creatinine Ratio 16 6 - 22 (calc)   Sodium 136 135 - 146 mmol/L   Potassium 4.1 3.5 - 5.3 mmol/L   Chloride 102 98 - 110 mmol/L   CO2 26 20 - 32 mmol/L   Calcium 9.6 8.6 - 10.4 mg/dL   Total Protein 6.1 6.1 - 8.1 g/dL   Albumin 4.0 3.6 - 5.1 g/dL   Globulin 2.1 1.9 - 3.7 g/dL (calc)   AG Ratio 1.9 1.0 - 2.5 (calc)   Total Bilirubin 0.4 0.2 - 1.2 mg/dL   Alkaline phosphatase (APISO) 69 37 - 153 U/L   AST 17 10 - 35 U/L   ALT 12 6 - 29 U/L  CBC with Differential/Platelet     Status: Abnormal   Collection Time: 09/06/19  8:08 AM  Result Value Ref Range   WBC 7.1 3.8 - 10.8 Thousand/uL   RBC 3.15 (L) 3.80 - 5.10 Million/uL   Hemoglobin 9.8 (L) 11.7 - 15.5 g/dL   HCT 30.5 (L) 35 - 45 %   MCV 96.8 80.0 - 100.0 fL   MCH 31.1 27.0 - 33.0 pg   MCHC 32.1 32.0 - 36.0 g/dL   RDW 13.9 11.0 - 15.0 %   Platelets 364 140 - 400 Thousand/uL   MPV 8.8 7.5 - 12.5 fL   Neutro Abs 3,891 1,500 - 7,800 cells/uL   Lymphs Abs 2,513 850 - 3,900 cells/uL   Absolute  Monocytes 525 200 - 950 cells/uL   Eosinophils Absolute 142 15 - 500 cells/uL   Basophils Absolute 28 0 - 200 cells/uL   Neutrophils Relative % 54.8 %   Total Lymphocyte 35.4 %   Monocytes Relative 7.4 %   Eosinophils Relative 2.0 %   Basophils Relative 0.4 %  Hemoglobin A1c     Status: Abnormal   Collection Time: 09/06/19  8:08 AM  Result Value Ref Range   Hgb A1c MFr Bld 6.6 (H) <5.7 % of total Hgb    Comment: For someone without known diabetes, a hemoglobin A1c value of 6.5% or greater indicates that they may have  diabetes and this should be confirmed with a follow-up  test. . For someone with known diabetes, a value <7% indicates  that their diabetes is well controlled and a value  greater than or equal to 7% indicates suboptimal  control. A1c targets should be individualized based on  duration of diabetes, age, comorbid conditions, and  other considerations. . Currently, no consensus exists regarding use of hemoglobin A1c for diagnosis of diabetes for children. .    Mean Plasma Glucose 143 (calc)   eAG (mmol/L) 7.9 (calc)    -------------------------------------------------------------------------- A&P:  Problem List Items Addressed This Visit    None    Visit Diagnoses    Acute cystitis without hematuria    -  Primary   Relevant Medications   ciprofloxacin (CIPRO) 250 MG tablet   Mixed incontinence         Clinically with complex history of recurrent UTI and mixed incontinence symptoms  May have underlying neurogenic bladder as well Followed by [facility] Dr Matilde Sprang, next apt 10/02/19 Already on antibiotic prophylaxis Macrobid 100mg  daily - seems may have had breakthrough UTI, unsure since cannot get to office to leave urine sample due to lack of transportation Agreed to treat her virtually today - we discussed her recent med changes, she was questioning side effect on new meds Mirtazapine  - seems less likely otherwise she is probably still having recurrence  with known bladder problems.  Trial back on Cipro 250 BID x 7 days short course for likely UTI. Empiric treatment without urine today HOLD Macrobid 100mg  daily prophylaxis while on Cipro, can resume after F/u with Urology  Will forward message to Ralene Bathe NP [facility] to keep her updated and check in for her next follow-up with patient, since we have been working on adjusting her medications and improve her symptoms.   No orders of the defined types were placed in this encounter.   Follow-up: as scheduled  Patient verbalizes understanding with the above medical recommendations including the limitation of remote medical advice.  Specific follow-up and call-back criteria were given for patient to follow-up or seek medical care more urgently if needed.   - Time spent in direct consultation with patient on phone: 15 minutes   Nobie Putnam, [facility] 09/20/2019, 1:48 PM

## 2019-09-21 ENCOUNTER — Telehealth: Payer: PPO

## 2019-09-21 ENCOUNTER — Ambulatory Visit (INDEPENDENT_AMBULATORY_CARE_PROVIDER_SITE_OTHER): Payer: PPO | Admitting: General Practice

## 2019-09-21 ENCOUNTER — Telehealth: Payer: Self-pay | Admitting: General Practice

## 2019-09-21 ENCOUNTER — Telehealth: Payer: PPO | Admitting: General Practice

## 2019-09-21 DIAGNOSIS — I10 Essential (primary) hypertension: Secondary | ICD-10-CM

## 2019-09-21 DIAGNOSIS — E1121 Type 2 diabetes mellitus with diabetic nephropathy: Secondary | ICD-10-CM

## 2019-09-21 DIAGNOSIS — E1122 Type 2 diabetes mellitus with diabetic chronic kidney disease: Secondary | ICD-10-CM

## 2019-09-21 DIAGNOSIS — E1169 Type 2 diabetes mellitus with other specified complication: Secondary | ICD-10-CM

## 2019-09-21 DIAGNOSIS — N183 Chronic kidney disease, stage 3 unspecified: Secondary | ICD-10-CM

## 2019-09-21 DIAGNOSIS — E063 Autoimmune thyroiditis: Secondary | ICD-10-CM

## 2019-09-21 DIAGNOSIS — N3946 Mixed incontinence: Secondary | ICD-10-CM

## 2019-09-21 DIAGNOSIS — W19XXXD Unspecified fall, subsequent encounter: Secondary | ICD-10-CM

## 2019-09-21 DIAGNOSIS — F418 Other specified anxiety disorders: Secondary | ICD-10-CM

## 2019-09-21 DIAGNOSIS — K648 Other hemorrhoids: Secondary | ICD-10-CM

## 2019-09-21 DIAGNOSIS — E038 Other specified hypothyroidism: Secondary | ICD-10-CM

## 2019-09-21 DIAGNOSIS — E785 Hyperlipidemia, unspecified: Secondary | ICD-10-CM

## 2019-09-21 DIAGNOSIS — K644 Residual hemorrhoidal skin tags: Secondary | ICD-10-CM

## 2019-09-21 DIAGNOSIS — N3000 Acute cystitis without hematuria: Secondary | ICD-10-CM

## 2019-09-21 DIAGNOSIS — K5903 Drug induced constipation: Secondary | ICD-10-CM

## 2019-09-21 NOTE — Telephone Encounter (Signed)
  Chronic Care Management   Note  09/21/2019 Name: CATHRINE KRIZAN MRN: [id number] DOB: 1929/07/02  The patient called back. New encounter created. See care plan for details.  Follow up plan: As recorded on encounter from reaching the patient   [place], MSN, [facility] Mobile: [phone]

## 2019-09-21 NOTE — Chronic Care Management (AMB) (Signed)
Chronic Care Management   Follow Up Note   09/21/2019 Name: AVIAH SORCI MRN: [id number] DOB: [birth year]  Referred by: Olin Hauser, DO Reason for referral : Chronic Care Management [facility] Outreach follow up for Chronic Disease Management and Care coordination needs)   SHEMEKA WARDLE is a 86 y.o. year old female who is a primary care patient of Olin Hauser, DO. The CCM team was consulted for assistance with chronic disease management and care coordination needs.    Review of patient status, including review of consultants reports, relevant laboratory and other test results, and collaboration with appropriate care team members and the patient's provider was performed as part of comprehensive patient evaluation and provision of chronic care management services.    SDOH (Social Determinants of Health) assessments performed: Yes See Care Plan activities for detailed interventions related to [facility])     Outpatient Encounter Medications as of 09/21/2019  Medication Sig Note  . Blood Glucose Monitoring Suppl ([place]) w/Device KIT Use glucometer to check blood sugar daily.   . cholecalciferol (VITAMIN D) 1000 units tablet Take 2,000 Units by mouth daily.    . ciprofloxacin (CIPRO) 250 MG tablet Take 1 tablet (250 mg total) by mouth 2 (two) times daily. For 7 days   . escitalopram (LEXAPRO) 5 MG tablet TAKE 1 TABLET BY MOUTH ONCE DAILY WITH FOOD (Patient taking differently: Take 5 mg by mouth daily. )   . esomeprazole (NEXIUM) 20 MG capsule Take 20 mg by mouth daily at 12 noon.   . feeding supplement, ENSURE ENLIVE, (ENSURE ENLIVE) LIQD Take 237 mLs by mouth 3 (three) times daily.   . ferrous sulfate 325 (65 FE) MG tablet Take 325 mg by mouth daily.  (Patient not taking: Reported on 09/20/2019)   . glucose blood (ONE TOUCH ULTRA TEST) test strip CHECK BLOOD SUGAR UP TO 2 TIMES A DAY.   . hydrocortisone (ANUSOL-HC) 2.5 % rectal cream USE 1 APPLICATION RECTALLY  TWICE DAILY AS DIRECTED   . hydrocortisone (ANUSOL-HC) 25 MG suppository Place 1 suppository (25 mg total) rectally 2 (two) times daily. For 7 days for hemorrhoid   . hydrocortisone 2.5 % cream Place rectally. (Patient not taking: Reported on 09/20/2019)   . levothyroxine (SYNTHROID) 25 MCG tablet TAKE 1 TABLET BY MOUTH ONCE DAILY ON AN EMPTY STOMACH. WAIT 30 MINUTES BEFORE TAKING OTHER MEDS. (Patient taking differently: Take 25 mcg by mouth daily before breakfast. TAKE 1 TABLET BY MOUTH ONCE DAILY ON AN EMPTY STOMACH. WAIT 30 MINUTES BEFORE TAKING OTHER MEDS.)   . lisinopril (ZESTRIL) 5 MG tablet Take 5 mg by mouth daily.   . mirtazapine (REMERON) 15 MG tablet Take 1 tablet (15 mg total) by mouth at bedtime. (Patient not taking: Reported on 09/20/2019)   . nitrofurantoin (MACRODANTIN) 100 MG capsule Take 1 capsule (100 mg total) by mouth daily.   Glory Rosebush DELICA LANCETS 15A MISC 1 each by Does not apply route 3 (three) times daily. Dx:E11.9   . Plecanatide (TRULANCE) 3 MG TABS Take 3 mg by mouth daily. (Patient not taking: Reported on 09/20/2019)   . polyethylene glycol (MIRALAX / GLYCOLAX) 17 g packet Take 17 g by mouth daily as needed for moderate constipation or severe constipation.   . pramipexole (MIRAPEX) 0.25 MG tablet Take 1 tablet (0.25 mg total) by mouth at bedtime. May increase to 2 tabs after 1 week if not improved. For Restless Legs (Patient not taking: Reported on 09/20/2019)   . Pramoxine-HC (  HYDROCORTISONE ACE-PRAMOXINE) 2.5-1 % CREA Place rectally.   . senna (SENOKOT) 8.6 MG TABS tablet Take 1 tablet (8.6 mg total) by mouth 2 (two) times daily. (Patient taking differently: Take 1 tablet by mouth 2 (two) times daily as needed for mild constipation or moderate constipation. )   . simvastatin (ZOCOR) 40 MG tablet TAKE 1 TABLET BY MOUTH AT BEDTIME (Patient taking differently: Take 40 mg by mouth at bedtime. ) 07/04/2019: May interact with patients prednisone   No facility-administered encounter  medications on file as of 09/21/2019.     Objective:  BP Readings from Last 3 Encounters:  09/13/19 (!) 141/65  08/15/19 (!) 118/62  08/14/19 (!) 167/75   Lab Results  Component Value Date   HGBA1C 6.6 (H) 09/06/2019    Goals Addressed              This Visit's Progress   .  RNCM: Pt- "I feel better today" (pt-stated)        CARE PLAN ENTRY (see longitudinal plan of care for additional care plan information)  Current Barriers:  Marland Kitchen Knowledge Deficits related to recurrent UTI, OAB and urinary incontinence  . Care Coordination needs related to urinary health in a patient with recurrent UTIs/OAD/Urinary incontinence  . Chronic Disease Management support and education needs related to frequent UTI, OAD, and urinary incontinence  . Lacks caregiver support.   Nurse Case Manager Clinical Goal(s):  Marland Kitchen Over the next 120 days, patient will verbalize understanding of plan for treatment and prevention of UTIs . Over the next 120 days, patient will work with [facility] and pcp to address needs related to UTIs/OAB/Urinary incontinence  . Over the next 120 days, patient will attend all scheduled medical appointments: sees urology specialist on 09-29-2019. Sees pcp again on 10-16-2019 at 1:40 pm . Over the next 120 days, patient will verbalize basic understanding of cause of reoccurring UTI and urinary  disease process and self health management plan as evidenced by decrease incident of UTIs. Increased pelvic floor muscle activity by doing Kegel exercises, and better bladder control . Over the next 120 days, the patient will demonstrate ongoing self health care management ability as evidenced by no new UTIs and increased urinary health and maintenance.   Interventions:  . Inter-disciplinary care team collaboration (see longitudinal plan of care) . Evaluation of current treatment plan related to UTIs/OAB/Urinary incontience and patient's adherence to plan as established by provider. . Advised patient to  call the provider for worsening urinary sx and sx . Provided education to patient re: kegel exercises, bladder emptying schedule, hydration, safety, and cleanliness of peri area . Discussed plans with patient for ongoing care management follow up and provided patient with direct contact information for care management team . Provided patient with urinary health educational materials related to kegal exercises and help with urinary incontinence through the my chart functionality.  . Reviewed scheduled/upcoming provider appointments including: 09-29-2019 with the specialist and 10-16-2019 with the pcp.   Patient Self Care Activities:  . Patient verbalizes understanding of plan to work with pcp, specialist and CCM team to meet urinary health needs and wellness . Attends all scheduled provider appointments . Calls provider office for new concerns or questions . Unable to independently manage UTIs/OAB/urinary incontinence   Initial goal documentation     .  RNCM: Pt- "I need a new body" (pt-stated)        CARE PLAN ENTRY (see longtitudinal plan of care for additional care plan information)  Current Barriers:  .  Chronic Disease Management support, education, and care coordination needs related to HTN, HLD, DMII, CKD Stage 3, Anxiety, and hypothyroidism  Clinical Goal(s) related to HTN, HLD, DMII, CKD Stage 3, Anxiety, and Hypothyroidism:  Over the next 120 days, patient will:  . Work with the care management team to address educational, disease management, and care coordination needs  . Begin or continue self health monitoring activities as directed today Measure and record cbg (blood glucose) 1/2 times a week, Measure and record blood pressure 2 times per week, and adhere to a heart healthy/ADA diet . Call provider office for new or worsened signs and symptoms Blood glucose findings outside established parameters, Blood pressure findings outside established parameters, Shortness of breath, and New  or worsened symptom related to HLD/Anxiety/CKD/Hypothyroidism  . Call care management team with questions or concerns . Verbalize basic understanding of patient centered plan of care established today  Interventions related to HTN, HLD, DMII, CKD Stage 3, Anxiety, and hypothyroidism :  . Evaluation of current treatment plans and patient's adherence to plan as established by provider. The patient has a good understanding of her plan of care. She is thankful for the help in managing her care. The patient is currently dealing with constipation and hemorrhoids but states her symptoms are better since she saw the pcp.  She has had a  hard time over the last couple of months due to her fall and sacral fracture. 09-21-2019: The patient states the constipation is better. She is still working on getting it to where she does not have to take Miralax because she knows it is not good for her kidneys.  . Assessed patient understanding of disease states.  The patient understands her chronic conditions.  She lives alone but has help from her son. He checks on her daily. She lost her husband in April and is mourning the loss of her husband. Empathetic listening and support given. She has 2 step daughters that live in [place]. They all came and celebrated her birthday this week. This made the patient happy.  . Assessed patient's education and care coordination needs.  The patient does have a need for transportation as her son is working a lot of overtime; however she does have some family and friends that can help with appointments. Offered a care guide referral for the patient but she verbalized  until she was walking better she did not want to go on a bus or arranged transportation where they would drop her off and come back and get her. Eduction on the care guide team available to help as needed. The patient verbalized understanding.  . Provided disease specific education to patient.  Education on taking medications as  directed, the patient verbalized compliance. Education on heatlhy/ADA diet. The patient has been losing weight. Discussed supplements.  Nash Dimmer with appropriate clinical care team members regarding patient needs. Contact information provided to the patient for the [facility].   LCSW and Pharm D are currently working with the patient. The patient is thankful for the team working with her and she is very happy with her pcp. . Evaluation of upcoming appointments: Sees pcp on 10-16-2019 at 1:40 pm.  The RNCM will plan on seeing the patient when she comes in for her visit with pcp.   Patient Self Care Activities related to HTN, HLD, DMII, CKD Stage 3, Anxiety, and hypothyroidism :  . Patient is unable to independently self-manage chronic health conditions  Please see past updates related to this goal by  clicking on the "Past Updates" button in the selected goal      .  RNCM: Pt: "I am walking with a walker"        [place] (see longitudinal plan of care for additional care plan information)  Current Barriers:  Marland Kitchen Knowledge Deficits related to fall precautions in patient with fall x 3 plus months ago with hospitalization (sacral fracture) and other chronic conditions.  . Decreased adherence to prescribed treatment for fall prevention . Care Coordination needs related to possible need for transportation/in home services in a patient with HTN/CKD3/Dm2/Hypothyroidism and Anxiety (disease states) . Lacks caregiver support. Son is very supportive but works at [employer] and is not always able to take her to appointments. Has 2 step-daughters that live in [place] . Transportation barriers- has to depend on family and friends for transporation   Clinical Goal(s):  Marland Kitchen Over the next 120 days, patient will demonstrate improved adherence to prescribed treatment plan for decreasing falls as evidenced by patient reporting and review of EMR . Over the next 120 days, patient will verbalize using fall risk  reduction strategies discussed . Over the next 120 days, patient will not experience additional falls   Interventions:  . Provided written and verbal education re: Potential causes of falls and Fall prevention strategies . Reviewed medications and discussed potential side effects of medications such as dizziness and frequent urination . Assessed for s/s of orthostatic hypotension . Assessed for falls since last encounter. Has not had any new falls at this time. 09-21-2019: The patient denies any falls and the patient is being safe. She continues to use her walker without difficulty.  . Assessed patients knowledge of fall risk prevention secondary to previously provided education. . Assessed working status of life alert bracelet and patient adherence . Provided patient information for fall alert systems . Discussed plans with patient for ongoing care management follow up and provided patient with direct contact information for care management team  Patient Self Care Activities:  . Utilize walker (assistive device) appropriately with all ambulation . De-clutter walkways . Change positions slowly . Wear secure fitting shoes at all times with ambulation . Utilize home lighting for dim lit areas . Have self and pet awareness at all times . Working with PT/OT and nursing in the home for strengthening and support  Plan: . CCM RN CM will follow up in 14 days   Please see past updates related to this goal by clicking on the "Past Updates" button in the selected goal          Plan:   Telephone follow up appointment with care management team member scheduled for: 11-23-2019 at 1:45pm   [place], MSN, [place] Mobile: [phone]

## 2019-09-21 NOTE — Patient Instructions (Signed)
Visit Information  Goals Addressed              This Visit's Progress     RNCM: Pt- "I feel better today" (pt-stated)        CARE PLAN ENTRY (see longitudinal plan of care for additional care plan information)  Current Barriers:   Knowledge Deficits related to recurrent UTI, OAB and urinary incontinence   Care Coordination needs related to urinary health in a patient with recurrent UTIs/OAD/Urinary incontinence   Chronic Disease Management support and education needs related to frequent UTI, OAD, and urinary incontinence   Lacks caregiver support.   Nurse Case Manager Clinical Goal(s):   Over the next 120 days, patient will verbalize understanding of plan for treatment and prevention of UTIs  Over the next 120 days, patient will work with [facility] and pcp to address needs related to UTIs/OAB/Urinary incontinence   Over the next 120 days, patient will attend all scheduled medical appointments: sees urology specialist on 09-29-2019. Sees pcp again on 10-16-2019 at 1:40 pm  Over the next 120 days, patient will verbalize basic understanding of cause of reoccurring UTI and urinary  disease process and self health management plan as evidenced by decrease incident of UTIs. Increased pelvic floor muscle activity by doing Kegel exercises, and better bladder control  Over the next 120 days, the patient will demonstrate ongoing self health care management ability as evidenced by no new UTIs and increased urinary health and maintenance.   Interventions:   Inter-disciplinary care team collaboration (see longitudinal plan of care)  Evaluation of current treatment plan related to UTIs/OAB/Urinary incontience and patient's adherence to plan as established by provider.  Advised patient to call the provider for worsening urinary sx and sx  Provided education to patient re: kegel exercises, bladder emptying schedule, hydration, safety, and cleanliness of peri area  Discussed plans with patient  for ongoing care management follow up and provided patient with direct contact information for care management team  Provided patient with urinary health educational materials related to kegal exercises and help with urinary incontinence through the my chart functionality.   Reviewed scheduled/upcoming provider appointments including: 09-29-2019 with the specialist and 10-16-2019 with the pcp.   Patient Self Care Activities:   Patient verbalizes understanding of plan to work with pcp, specialist and CCM team to meet urinary health needs and wellness  Attends all scheduled provider appointments  Calls provider office for new concerns or questions  Unable to independently manage UTIs/OAB/urinary incontinence   Initial goal documentation       RNCM: Pt- "I need a new body" (pt-stated)        CARE PLAN ENTRY (see longtitudinal plan of care for additional care plan information)  Current Barriers:   Chronic Disease Management support, education, and care coordination needs related to HTN, HLD, DMII, CKD Stage 3, Anxiety, and hypothyroidism  Clinical Goal(s) related to HTN, HLD, DMII, CKD Stage 3, Anxiety, and Hypothyroidism:  Over the next 120 days, patient will:   Work with the care management team to address educational, disease management, and care coordination needs   Begin or continue self health monitoring activities as directed today Measure and record cbg (blood glucose) 1/2 times a week, Measure and record blood pressure 2 times per week, and adhere to a heart healthy/ADA diet  Call provider office for new or worsened signs and symptoms Blood glucose findings outside established parameters, Blood pressure findings outside established parameters, Shortness of breath, and New or worsened symptom related  to HLD/Anxiety/CKD/Hypothyroidism   Call care management team with questions or concerns  Verbalize basic understanding of patient centered plan of care established  today  Interventions related to HTN, HLD, DMII, CKD Stage 3, Anxiety, and hypothyroidism :   Evaluation of current treatment plans and patient's adherence to plan as established by provider. The patient has a good understanding of her plan of care. She is thankful for the help in managing her care. The patient is currently dealing with constipation and hemorrhoids but states her symptoms are better since she saw the pcp.  She has had a  hard time over the last couple of months due to her fall and sacral fracture. 09-21-2019: The patient states the constipation is better. She is still working on getting it to where she does not have to take Miralax because she knows it is not good for her kidneys.   Assessed patient understanding of disease states.  The patient understands her chronic conditions.  She lives alone but has help from her son. He checks on her daily. She lost her husband in April and is mourning the loss of her husband. Empathetic listening and support given. She has 2 step daughters that live in [place]. They all came and celebrated her birthday this week. This made the patient happy.   Assessed patient's education and care coordination needs.  The patient does have a need for transportation as her son is working a lot of overtime; however she does have some family and friends that can help with appointments. Offered a care guide referral for the patient but she verbalized  until she was walking better she did not want to go on a bus or arranged transportation where they would drop her off and come back and get her. Eduction on the care guide team available to help as needed. The patient verbalized understanding.   Provided disease specific education to patient.  Education on taking medications as directed, the patient verbalized compliance. Education on heatlhy/ADA diet. The patient has been losing weight. Discussed supplements.   Collaborated with appropriate clinical care team members  regarding patient needs. Contact information provided to the patient for the [facility].   LCSW and Pharm D are currently working with the patient. The patient is thankful for the team working with her and she is very happy with her pcp.  Evaluation of upcoming appointments: Sees pcp on 10-16-2019 at 1:40 pm.  The RNCM will plan on seeing the patient when she comes in for her visit with pcp.   Patient Self Care Activities related to HTN, HLD, DMII, CKD Stage 3, Anxiety, and hypothyroidism :   Patient is unable to independently self-manage chronic health conditions  Please see past updates related to this goal by clicking on the "Past Updates" button in the selected goal        RNCM: Pt: "I am walking with a walker"        CARE PLAN ENTRY (see longitudinal plan of care for additional care plan information)  Current Barriers:   Knowledge Deficits related to fall precautions in patient with fall x 3 plus months ago with hospitalization (sacral fracture) and other chronic conditions.   Decreased adherence to prescribed treatment for fall prevention  Care Coordination needs related to possible need for transportation/in home services in a patient with HTN/CKD3/Dm2/Hypothyroidism and Anxiety (disease states)  Lacks caregiver support. Son is very supportive but works at [employer] and is not always able to take her to appointments. Has 2 step-daughters  that live in [place]- has to depend on family and friends for transporation   Clinical Goal(s):   Over the next 120 days, patient will demonstrate improved adherence to prescribed treatment plan for decreasing falls as evidenced by patient reporting and review of EMR  Over the next 120 days, patient will verbalize using fall risk reduction strategies discussed  Over the next 120 days, patient will not experience additional falls   Interventions:   Provided written and verbal education re: Potential causes of falls and Fall  prevention strategies  Reviewed medications and discussed potential side effects of medications such as dizziness and frequent urination  Assessed for s/s of orthostatic hypotension  Assessed for falls since last encounter. Has not had any new falls at this time. 09-21-2019: The patient denies any falls and the patient is being safe. She continues to use her walker without difficulty.   Assessed patients knowledge of fall risk prevention secondary to previously provided education.  Assessed working status of life alert bracelet and patient adherence  Provided patient information for fall alert systems  Discussed plans with patient for ongoing care management follow up and provided patient with direct contact information for care management team  Patient Self Care Activities:   Utilize walker (assistive device) appropriately with all ambulation  De-clutter walkways  Change positions slowly  Wear secure fitting shoes at all times with ambulation  Utilize home lighting for dim lit areas  Have self and pet awareness at all times  Working with PT/OT and nursing in the home for strengthening and support  Plan:  CCM RN CM will follow up in 14 days   Please see past updates related to this goal by clicking on the "Past Updates" button in the selected goal         Patient verbalizes understanding of instructions provided today.   Telephone follow up appointment with care management team member scheduled for: 11-23-2019 at 1:45pm  Noreene Larsson RN, MSN, [facility] Mobile: [phone]  Urinary Incontinence  Urinary incontinence refers to a condition in which a person is unable to control where and when to pass urine. A person with this condition will urinate when he or she does not mean to (involuntarily). What are the causes? This condition may be caused  by:  Medicines.  Infections.  Constipation.  Overactive bladder muscles.  Weak bladder muscles.  Weak pelvic floor muscles. These muscles provide support for the bladder, intestine, and, in women, the uterus.  Enlarged prostate in men. The prostate is a gland near the bladder. When it gets too big, it can pinch the urethra. With the urethra blocked, the bladder can weaken and lose the ability to empty properly.  Surgery.  Emotional factors, such as anxiety, stress, or post-traumatic stress disorder (PTSD).  Pelvic organ prolapse. This happens in women when organs shift out of place and into the vagina. This shift can prevent the bladder and urethra from working properly. What increases the risk? The following factors may make you more likely to develop this condition:  Older age.  Obesity and physical inactivity.  Pregnancy and childbirth.  Menopause.  Diseases that affect the nerves or spinal cord (neurological diseases).  Long-term (chronic) coughing. This can increase pressure on the bladder and pelvic floor muscles. What are the signs or symptoms? Symptoms may vary depending on the type of urinary incontinence you have. They include:  A sudden  urge to urinate, but passing urine involuntarily before you can get to a bathroom (urge incontinence).  Suddenly passing urine with any activity that forces urine to pass, such as coughing, laughing, exercise, or sneezing (stress incontinence).  Needing to urinate often, but urinating only a small amount, or constantly dribbling urine (overflow incontinence).  Urinating because you cannot get to the bathroom in time due to a physical disability, such as arthritis or injury, or communication and thinking problems, such as Alzheimer disease (functional incontinence). How is this diagnosed? This condition may be diagnosed based on:  Your medical history.  A physical exam.  Tests, such as: ? Urine tests. ? X-rays of your  kidney and bladder. ? Ultrasound. ? CT scan. ? Cystoscopy. In this procedure, a health care provider inserts a tube with a light and camera (cystoscope) through the urethra and into the bladder in order to check for problems. ? Urodynamic testing. These tests assess how well the bladder, urethra, and sphincter can store and release urine. There are different types of urodynamic tests, and they vary depending on what the test is measuring. To help diagnose your condition, your health care provider may recommend that you keep a log of when you urinate and how much you urinate. How is this treated? Treatment for this condition depends on the type of incontinence that you have and its cause. Treatment may include:  Lifestyle changes, such as: ? Quitting smoking. ? Maintaining a healthy weight. ? Staying active. Try to get 150 minutes of moderate-intensity exercise every week. Ask your health care provider which activities are safe for you. ? Eating a healthy diet.  Avoid high-fat foods, like fried foods.  Avoid refined carbohydrates like white bread and white rice.  Limit how much alcohol and caffeine you drink.  Increase your fiber intake. Foods such as fresh fruits, vegetables, beans, and whole grains are healthy sources of fiber.  Pelvic floor muscle exercises.  Bladder training, such as lengthening the amount of time between bathroom breaks, or using the bathroom at regular intervals.  Using techniques to suppress bladder urges. This can include distraction techniques or controlled breathing exercises.  Medicines to relax the bladder muscles and prevent bladder spasms.  Medicines to help slow or prevent the growth of a man's prostate.  Botox injections. These can help relax the bladder muscles.  Using pulses of electricity to help change bladder reflexes (electrical nerve stimulation).  For women, using a medical device to prevent urine leaks. This is a small, tampon-like,  disposable device that is inserted into the urethra.  Injecting collagen or carbon beads (bulking agents) into the urinary sphincter. These can help thicken tissue and close the bladder opening.  Surgery. Follow these instructions at home: Lifestyle  Limit alcohol and caffeine. These can fill your bladder quickly and irritate it.  Keep yourself clean to help prevent odors and skin damage. Ask your doctor about special skin creams and cleansers that can protect the skin from urine.  Consider wearing pads or adult diapers. Make sure to change them regularly, and always change them right after experiencing incontinence. General instructions  Take over-the-counter and prescription medicines only as told by your health care provider.  Use the bathroom about every 3-4 hours, even if you do not feel the need to urinate. Try to empty your bladder completely every time. After urinating, wait a minute. Then try to urinate again.  Make sure you are in a relaxed position while urinating.  If your incontinence is  caused by nerve problems, keep a log of the medicines you take and the times you go to the bathroom.  Keep all follow-up visits as told by your health care provider. This is important. Contact a health care provider if:  You have pain that gets worse.  Your incontinence gets worse. Get help right away if:  You have a fever or chills.  You are unable to urinate.  You have redness in your groin area or down your legs. Summary  Urinary incontinence refers to a condition in which a person is unable to control where and when to pass urine.  This condition may be caused by medicines, infection, weak bladder muscles, weak pelvic floor muscles, enlargement of the prostate (in men), or surgery.  The following factors increase your risk for developing this condition: older age, obesity, pregnancy and childbirth, menopause, neurological diseases, and chronic coughing.  There are several  types of urinary incontinence. They include urge incontinence, stress incontinence, overflow incontinence, and functional incontinence.  This condition is usually treated first with lifestyle and behavioral changes, such as quitting smoking, eating a healthier diet, and doing regular pelvic floor exercises. Other treatment options include medicines, bulking agents, medical devices, electrical nerve stimulation, or surgery. This information is not intended to replace advice given to you by your health care provider. Make sure you discuss any questions you have with your health care provider. Document Revised: 01/15/2017 Document Reviewed: 04/16/2016 Elsevier Patient Education  [place].  Acute Urinary Retention, Female  Acute urinary retention means that you cannot pee (urinate) at all, or that you pee too little and your bladder is not emptied completely. If it is not treated, it can lead to kidney damage or other serious problems. Follow these instructions at home:  Take over-the-counter and prescription medicines only as told by your doctor. Ask your doctor what medicines you should stay away from. Do not take any medicine unless your doctor says it is okay to do so.  If you were sent home with a tube that drains pee from the bladder (catheter), take care of it as told by your doctor.  Drink enough fluid to keep your pee clear or pale yellow.  If you were given an antibiotic, take it as told by your doctor. Do not stop taking the antibiotic even if you start to feel better.  Do not use any products that contain nicotine or tobacco, such as cigarettes and e-cigarettes. If you need help quitting, ask your doctor.  Watch for changes in your symptoms. Tell your doctor about them.  If told, keep track of any changes in your blood pressure at home. Tell your doctor about them.  Keep all follow-up visits as told by your doctor. This is important. Contact a doctor if:  You have  spasms or you leak pee when you have spasms. Get help right away if:  You have chills or a fever.  You have blood in your pee.  You have a tube that drains the bladder and: ? The tube stops draining pee. ? The tube falls out. Summary  Acute urinary retention means that you cannot pee at all, or that you pee too little and your bladder is not emptied completely. If it is not treated, it can result in kidney damage or other serious problems.  If you were sent home with a tube that drains pee from the bladder, take care of it as told by your doctor.  Pay attention to any changes  in your symptoms. Tell your doctor about them. This information is not intended to replace advice given to you by your health care provider. Make sure you discuss any questions you have with your health care provider. Document Revised: 12/18/2016 Document Reviewed: 02/07/2016 Elsevier Patient Education  [place].  Urinary Tract Infection, Adult A urinary tract infection (UTI) is an infection of any part of the urinary tract. The urinary tract includes:  The kidneys.  The ureters.  The bladder.  The urethra. These organs make, store, and get rid of pee (urine) in the body. What are the causes? This is caused by germs (bacteria) in your genital area. These germs grow and cause swelling (inflammation) of your urinary tract. What increases the risk? You are more likely to develop this condition if:  You have a small, thin tube (catheter) to drain pee.  You cannot control when you pee or poop (incontinence).  You are female, and: ? You use these methods to prevent pregnancy:  A medicine that kills sperm (spermicide).  A device that blocks sperm (diaphragm). ? You have low levels of a female hormone (estrogen). ? You are pregnant.  You have genes that add to your risk.  You are sexually active.  You take antibiotic medicines.  You have trouble peeing because of: ? A prostate that is  bigger than normal, if you are female. ? A blockage in the part of your body that drains pee from the bladder (urethra). ? A kidney stone. ? A nerve condition that affects your bladder (neurogenic bladder). ? Not getting enough to drink. ? Not peeing often enough.  You have other conditions, such as: ? Diabetes. ? A weak disease-fighting system (immune system). ? Sickle cell disease. ? Gout. ? Injury of the spine. What are the signs or symptoms? Symptoms of this condition include:  Needing to pee right away (urgently).  Peeing often.  Peeing small amounts often.  Pain or burning when peeing.  Blood in the pee.  Pee that smells bad or not like normal.  Trouble peeing.  Pee that is cloudy.  Fluid coming from the vagina, if you are female.  Pain in the belly or lower back. Other symptoms include:  Throwing up (vomiting).  No urge to eat.  Feeling mixed up (confused).  Being tired and grouchy (irritable).  A fever.  Watery poop (diarrhea). How is this treated? This condition may be treated with:  Antibiotic medicine.  Other medicines.  Drinking enough water. Follow these instructions at home:  Medicines  Take over-the-counter and prescription medicines only as told by your doctor.  If you were prescribed an antibiotic medicine, take it as told by your doctor. Do not stop taking it even if you start to feel better. General instructions  Make sure you: ? Pee until your bladder is empty. ? Do not hold pee for a long time. ? Empty your bladder after sex. ? Wipe from front to back after pooping if you are a female. Use each tissue one time when you wipe.  Drink enough fluid to keep your pee pale yellow.  Keep all follow-up visits as told by your doctor. This is important. Contact a doctor if:  You do not get better after 1-2 days.  Your symptoms go away and then come back. Get help right away if:  You have very bad back pain.  You have very bad  pain in your lower belly.  You have a fever.  You are sick  to your stomach (nauseous).  You are throwing up. Summary  A urinary tract infection (UTI) is an infection of any part of the urinary tract.  This condition is caused by germs in your genital area.  There are many risk factors for a UTI. These include having a small, thin tube to drain pee and not being able to control when you pee or poop.  Treatment includes antibiotic medicines for germs.  Drink enough fluid to keep your pee pale yellow. This information is not intended to replace advice given to you by your health care provider. Make sure you discuss any questions you have with your health care provider. Document Revised: 12/23/2017 Document Reviewed: 07/15/2017 Elsevier Patient Education  2020 [employer].

## 2019-09-22 ENCOUNTER — Ambulatory Visit: Payer: PPO | Admitting: Physician Assistant

## 2019-09-26 ENCOUNTER — Telehealth: Payer: Self-pay

## 2019-09-26 NOTE — Telephone Encounter (Signed)
Palliative care SW outreached patient to schedule in home visit. SW also attempted to outreach patient son, [name], to make aware of visit, SW unable to LVM for son due to VM not being set up. Visit scheduled for Wed 09-27-2019 @1pm .

## 2019-09-26 NOTE — Telephone Encounter (Signed)
Pt has been r/s for 10/04/2019

## 2019-09-26 NOTE — Telephone Encounter (Signed)
Pt has been r/s for 10/10/2019  

## 2019-09-27 ENCOUNTER — Other Ambulatory Visit: Payer: Self-pay

## 2019-09-27 ENCOUNTER — Other Ambulatory Visit: Payer: PPO

## 2019-09-27 DIAGNOSIS — Z515 Encounter for palliative care: Secondary | ICD-10-CM

## 2019-09-27 NOTE — Progress Notes (Signed)
COMMUNITY PALLIATIVE CARE SW NOTE  PATIENT NAME: Lisa Mills DOB: [birth year] MRN: [id number]  PRIMARY CARE PROVIDER: Olin Hauser, DO  RESPONSIBLE PARTY:  Acct ID - Guarantor Home Phone Work Phone Relationship Acct Type  [id number] [name], [name] [phone]  Self P/F     [place], [place] [address]     PLAN OF CARE and INTERVENTIONS:             1. GOALS OF CARE/ ADVANCE CARE PLANNING: Patient is a DNR. Son is HCPOA. Patient has living will. Patients goal is to become independent again and gain strength in legs.  2. SOCIAL/EMOTIONAL/SPIRITUAL ASSESSMENT/ INTERVENTIONS:  SW and RN Almyra Free met with patient in patients home. Patient updated SW and RN on medical conditions. Patient shared she continues to be constipated and she takes miralax in her coffee and drinks warm prune juice, patient shared that she is continuously working with with PCP on this issue. Patient stated that she still haves pain in her pelvic and lower back area from her surgery she had 4 months ago. Patient stated that she does not take pain medicine besides Tylenol. Patient continues to live alone but has support from her son who visits daily and stays with her on the weekend and her daughter who lives in [place] but has been visiting and staying with patient more often now. Patients no longer has previous caregiver due to caregiver having legal issues. Patient stated that she is not interested in having another caregiver at this time unless she knows that they are trustworthy. Patient states that she is fine alone for now with her children checking on her daily. SW advised patient that SW can provide agencies for patient to consider caregivers, patient shared that agencies cost too much. SW advised patient obtain a life alert. Patient shares that she manages all her medications on her own and has them all lined up on her kitchen counter. Patient's appetite has improved over ad eats 3 meals a day. SW, discussed  goals, reviewed care plan, provided emotional support, used active and reflective listening. Will continue to follow for symptom management and grief support.    3. PATIENT/CAREGIVER EDUCATION/ COPING:  Patient A&O x3 and engaged in discussion with SW. Patient denied any feelings of anxiousness or depression. Patient continues going through grieving process. Family is supportive and visits and stays with patient often/daily. 4. PERSONAL EMERGENCY PLAN:  Patient to call 911 for emergencies. 5. COMMUNITY RESOURCES COORDINATION/ HEALTH CARE NAVIGATION:  Patient manages her own care with the assistance of her son.  FINANCIAL/LEGAL CONCERNS/INTERVENTIONS:  None. Patient on fixed income.     SOCIAL HX:  Social History   Tobacco Use  . Smoking status: Never Smoker  . Smokeless tobacco: Never Used  Substance Use Topics  . Alcohol use: Not Currently    CODE STATUS: DNR  ADVANCED DIRECTIVES: Y MOST FORM COMPLETE: N HOSPICE EDUCATION PROVIDED: N  PPS: Patient is independent with all ADL's. Patient is able to meal prep but eats frozen meals and family brings meals throughout the week.    Time Spent: 45 min      [place], [place]

## 2019-09-28 ENCOUNTER — Other Ambulatory Visit: Payer: Self-pay | Admitting: Gastroenterology

## 2019-09-28 DIAGNOSIS — K5900 Constipation, unspecified: Secondary | ICD-10-CM

## 2019-09-28 DIAGNOSIS — R634 Abnormal weight loss: Secondary | ICD-10-CM

## 2019-09-28 DIAGNOSIS — R1084 Generalized abdominal pain: Secondary | ICD-10-CM

## 2019-09-29 ENCOUNTER — Ambulatory Visit
Admission: RE | Admit: 2019-09-29 | Discharge: 2019-09-29 | Disposition: A | Discharge status: Home / Self Care | Payer: PPO | Source: Ambulatory Visit | Attending: Student | Admitting: Student

## 2019-09-29 ENCOUNTER — Other Ambulatory Visit: Payer: Self-pay

## 2019-09-29 DIAGNOSIS — R1310 Dysphagia, unspecified: Secondary | ICD-10-CM | POA: Diagnosis not present

## 2019-09-29 DIAGNOSIS — K219 Gastro-esophageal reflux disease without esophagitis: Secondary | ICD-10-CM | POA: Diagnosis not present

## 2019-09-29 DIAGNOSIS — R6881 Early satiety: Secondary | ICD-10-CM | POA: Diagnosis not present

## 2019-09-29 DIAGNOSIS — R1319 Other dysphagia: Secondary | ICD-10-CM

## 2019-09-29 DIAGNOSIS — R634 Abnormal weight loss: Secondary | ICD-10-CM

## 2019-10-02 ENCOUNTER — Ambulatory Visit: Payer: PPO | Admitting: Urology

## 2019-10-02 ENCOUNTER — Encounter: Payer: Self-pay | Admitting: Urology

## 2019-10-02 ENCOUNTER — Other Ambulatory Visit: Payer: Self-pay

## 2019-10-02 VITALS — BP 158/70 | HR 69 | Ht 59.0 in | Wt 110.0 lb

## 2019-10-02 DIAGNOSIS — N3946 Mixed incontinence: Secondary | ICD-10-CM | POA: Diagnosis not present

## 2019-10-02 NOTE — Progress Notes (Signed)
10/02/2019 2:28 PM   Lisa Mills 03-Aug-1929 [id number]  Referring provider: Olin Hauser, DO [address] [place],  [place] [address]  Chief Complaint  Patient presents with  . Follow-up    HPI:  Patient was recently discharged from the hospital with pelvic fracture.  She fractured the sacrum bilaterally.  She had surgery.  She was having a lot of sacral pain.  She had a Foley catheter.  She said she could only void standing and was incontinent of urine and feces.  She had leg weakness.  She was seen by neurosurgery and they did not think she had cauda equina syndrome.  I was consulted to assess the patient's urinary incontinence.  She has high-volume voiding when she goes from a sitting to standing position that she does not feel.  She is not feeling urgency.  She has stress incontinence and moderately severe bedwetting.  She wears 6 pads a day sometimes damp and sometimes soaked.  8 months ago she described foot on the floor syndrome but she says her incontinence is now much worse since the fractured tailbone and pelvis.  She is now mobile with a walker and things are little bit better than they were a few weeks ago.  She is now going twice a night instead of 4 times.  She is voiding every 2 hours.  She recently in rehab center was treated for a bladder infection but does not normally get them.  Patient having a tremendous time having bowel movements.  Apparently being treated and has not had a good bowel movement for a week.  She may have decreased sensation in the perineum area but was nonspecific  Patient clinically had urge incontinence and likely now has a functional component but has a high risk factor for neurogenic bladder as well.  Hopefully things can also settle with more mobility.   I am suspect based upon the presentation and her urinalysis that once again she is infected.  She had a pansensitive bladder infection in June 2021.  I would like to give her  ciprofloxacin 250 mg twice a day for 1 week and put her on daily Macrodantin suppression therapy at least short-term.  I would like to see her in about 6 or 7 weeks once her functional status has improved and see if her incontinence is settled down.  If suppression therapy seems to be working I may or may not continue it.  I do not want to treat her overactive bladder yet or order urodynamics yet.  Too early since her injury.  Patient was primary care for bowel dysfunction doctor.  She understands she could have an element of a neurogenic bladder and time can help with this as well.  Today Frequency stable.  Last urine culture positive I believe the patient did have a breakthrough infection with burning and foul-smelling urine effectively treated with Cipro by primary care a few weeks ago.  Having said that I will keep her on the daily Macrodantin.  If she gets a second breakthrough I will switch it  She says her incontinence is much better.  High-volume flooding episodes upon standing have greatly decreased.  Still has some foot on the floor syndrome little night.  She is now walking with or without a walker.  Still has constipation then can have issues with some fecal incontinence    PMH: Past Medical History:  Diagnosis Date  . Dermatophytosis of nail   . GERD (gastroesophageal reflux disease)   .  History of shingles   . Hypertension   . Keratoderma     Surgical History: Past Surgical History:  Procedure Laterality Date  . BREAST BIOPSY Right 09/30/2017   Affirm bx-calcs ( X clip),benign  . BREAST EXCISIONAL BIOPSY Right    neg  . cataracts    . COLONOSCOPY W/ POLYPECTOMY    . COLONOSCOPY WITH PROPOFOL N/A 02/10/2017   Procedure: COLONOSCOPY WITH PROPOFOL;  Surgeon: Manya Silvas, MD;  Location: [facility] ENDOSCOPY;  Service: Endoscopy;  Laterality: N/A;  . ESOPHAGOGASTRODUODENOSCOPY (EGD) WITH PROPOFOL N/A 02/10/2017   Procedure: ESOPHAGOGASTRODUODENOSCOPY (EGD) WITH PROPOFOL;   Surgeon: Manya Silvas, MD;  Location: [facility] ENDOSCOPY;  Service: Endoscopy;  Laterality: N/A;  . EYE SURGERY     bilateral cataracts  . HAND SURGERY    . KIDNEY STONE SURGERY    . SACROPLASTY N/A 07/06/2019   Procedure: SACROPLASTY;  Surgeon: Hessie Knows, MD;  Location: [facility] ORS;  Service: Orthopedics;  Laterality: N/A;  . TONSILECTOMY, ADENOIDECTOMY, BILATERAL MYRINGOTOMY AND TUBES    . TONSILLECTOMY    . TRIGGER FINGER RELEASE     Left ring finger  . tubercular peritonitis      Home Medications:  Allergies as of 10/02/2019      Reactions   Aspirin Other (See Comments)   Burns stomach   Conray [iothalamate] Hives   IV dye conray-400   Dye Fdc Red [red Dye] Hives   Sulfa Antibiotics    Unknown Reaction, not used in years   Prednisone    Indigestion       Medication List       Accurate as of October 02, 2019  2:28 PM. If you have any questions, ask your nurse or doctor.        cholecalciferol 1000 units tablet Commonly known as: VITAMIN D Take 2,000 Units by mouth daily.   ciprofloxacin 250 MG tablet Commonly known as: Cipro Take 1 tablet (250 mg total) by mouth 2 (two) times daily. For 7 days   escitalopram 5 MG tablet Commonly known as: LEXAPRO TAKE 1 TABLET BY MOUTH ONCE DAILY WITH FOOD What changed: See the new instructions.   esomeprazole 20 MG capsule Commonly known as: NEXIUM Take 20 mg by mouth daily at 12 noon.   feeding supplement (ENSURE ENLIVE) Liqd Take 237 mLs by mouth 3 (three) times daily.   ferrous sulfate 325 (65 FE) MG tablet Take 325 mg by mouth daily.   glucose blood test strip Commonly known as: ONE TOUCH ULTRA TEST CHECK BLOOD SUGAR UP TO 2 TIMES A DAY.   hydrocortisone 2.5 % cream Place rectally.   hydrocortisone 2.5 % rectal cream Commonly known as: ANUSOL-HC USE 1 APPLICATION RECTALLY TWICE DAILY AS DIRECTED   hydrocortisone 25 MG suppository Commonly known as: ANUSOL-HC Place 1 suppository (25 mg total) rectally 2  (two) times daily. For 7 days for hemorrhoid   levothyroxine 25 MCG tablet Commonly known as: SYNTHROID TAKE 1 TABLET BY MOUTH ONCE DAILY ON AN EMPTY STOMACH. WAIT 30 MINUTES BEFORE TAKING OTHER MEDS. What changed: See the new instructions.   lisinopril 5 MG tablet Commonly known as: ZESTRIL Take 5 mg by mouth daily.   mirtazapine 15 MG tablet Commonly known as: Remeron Take 1 tablet (15 mg total) by mouth at bedtime.   nitrofurantoin 100 MG capsule Commonly known as: Macrodantin Take 1 capsule (100 mg total) by mouth daily.   [place] w/Device Kit Use glucometer to check blood sugar daily.  OneTouch Delica Lancets 60A Misc 1 each by Does not apply route 3 (three) times daily. Dx:E11.9   polyethylene glycol 17 g packet Commonly known as: MIRALAX / GLYCOLAX Take 17 g by mouth daily as needed for moderate constipation or severe constipation.   pramipexole 0.25 MG tablet Commonly known as: Mirapex Take 1 tablet (0.25 mg total) by mouth at bedtime. May increase to 2 tabs after 1 week if not improved. For Restless Legs   senna 8.6 MG Tabs tablet Commonly known as: SENOKOT Take 1 tablet (8.6 mg total) by mouth 2 (two) times daily. What changed:   when to take this  reasons to take this   simvastatin 40 MG tablet Commonly known as: ZOCOR TAKE 1 TABLET BY MOUTH AT BEDTIME   Trulance 3 MG Tabs Generic drug: Plecanatide Take 3 mg by mouth daily.       Allergies:  Allergies  Allergen Reactions  . Aspirin Other (See Comments)    Burns stomach  . Conray [Iothalamate] Hives    IV dye conray-400  . Dye Fdc Red [Red Dye] Hives  . Sulfa Antibiotics     Unknown Reaction, not used in years  . Prednisone     Indigestion     Family History: Family History  Adopted: Yes  Problem Relation Age of Onset  . Breast cancer Neg Hx     Social History:  reports that she has never smoked. She has never used smokeless tobacco. She reports previous alcohol use.  She reports that she does not use drugs.  ROS:                                        Physical Exam: BP (!) 158/70 (BP Location: Left Arm, Patient Position: Sitting, Cuff Size: Normal)   Pulse 69   Ht _0  (1.499 m)   Wt 49.9 kg   BMI 22.22 kg/m   Constitutional:  Alert and oriented, No acute distress.   Laboratory Data: Lab Results  Component Value Date   WBC 7.1 09/06/2019   HGB 9.8 (L) 09/06/2019   HCT 30.5 (L) 09/06/2019   MCV 96.8 09/06/2019   PLT 364 09/06/2019    Lab Results  Component Value Date   CREATININE 0.94 (H) 09/06/2019    No results found for: PSA  No results found for: TESTOSTERONE  Lab Results  Component Value Date   HGBA1C 6.6 (H) 09/06/2019    Urinalysis    Component Value Date/Time   COLORURINE STRAW (A) 07/17/2019 1143   APPEARANCEUR Cloudy (A) 08/14/2019 1454   LABSPEC 1.005 07/17/2019 1143   PHURINE 8.0 07/17/2019 1143   GLUCOSEU Negative 08/14/2019 1454   HGBUR NEGATIVE 07/17/2019 1143   BILIRUBINUR Negative 08/14/2019 1454   KETONESUR 5 (A) 07/17/2019 1143   PROTEINUR Trace (A) 08/14/2019 1454   PROTEINUR NEGATIVE 07/17/2019 1143   UROBILINOGEN Normal 03/29/2017 0000   NITRITE Positive (A) 08/14/2019 1454   NITRITE NEGATIVE 07/17/2019 1143   LEUKOCYTESUR 1+ (A) 08/14/2019 1454   LEUKOCYTESUR NEGATIVE 07/17/2019 1143    Pertinent Imaging:   Assessment & Plan: Reassess in 8 weeks on daily Macrodantin.  Switch prophylaxis if she gets a second breakthrough.  I will not start an overactive bladder medication yet and see how much she improves.  She agreed with the treatment plan.  Call if today's urine culture positive  There are no diagnoses linked  to this encounter.  No follow-ups on file.  Reece Packer, MD  [facility] [address] [place], [place] [address] [phone]

## 2019-10-03 LAB — MICROSCOPIC EXAMINATION

## 2019-10-03 LAB — URINALYSIS, COMPLETE
Bilirubin, UA: NEGATIVE
Glucose, UA: NEGATIVE
Ketones, UA: NEGATIVE
Nitrite, UA: NEGATIVE
RBC, UA: NEGATIVE
Specific Gravity, UA: 1.015 (ref 1.005–1.030)
Urobilinogen, Ur: 0.2 mg/dL (ref 0.2–1.0)
pH, UA: 7 (ref 5.0–7.5)

## 2019-10-04 ENCOUNTER — Ambulatory Visit: Payer: PPO | Admitting: Pharmacist

## 2019-10-04 DIAGNOSIS — E785 Hyperlipidemia, unspecified: Secondary | ICD-10-CM

## 2019-10-04 DIAGNOSIS — E038 Other specified hypothyroidism: Secondary | ICD-10-CM

## 2019-10-04 DIAGNOSIS — E1121 Type 2 diabetes mellitus with diabetic nephropathy: Secondary | ICD-10-CM

## 2019-10-04 DIAGNOSIS — E1122 Type 2 diabetes mellitus with diabetic chronic kidney disease: Secondary | ICD-10-CM | POA: Diagnosis not present

## 2019-10-04 DIAGNOSIS — N183 Chronic kidney disease, stage 3 unspecified: Secondary | ICD-10-CM

## 2019-10-04 DIAGNOSIS — N3946 Mixed incontinence: Secondary | ICD-10-CM

## 2019-10-04 DIAGNOSIS — E1169 Type 2 diabetes mellitus with other specified complication: Secondary | ICD-10-CM

## 2019-10-04 DIAGNOSIS — I10 Essential (primary) hypertension: Secondary | ICD-10-CM | POA: Diagnosis not present

## 2019-10-04 DIAGNOSIS — F418 Other specified anxiety disorders: Secondary | ICD-10-CM

## 2019-10-04 DIAGNOSIS — E063 Autoimmune thyroiditis: Secondary | ICD-10-CM

## 2019-10-04 NOTE — Patient Instructions (Signed)
Thank you allowing the Chronic Care Management Team to be a part of your care! It was a pleasure speaking with you today!     CCM (Chronic Care Management) Team    Noreene Larsson RN, MSN, CCM Nurse Care Coordinator  [phone]   Harlow Asa PharmD  Clinical Pharmacist  [phone]   Eula Fried LCSW Clinical Social Worker [phone]  Visit Information  Goals Addressed            This Visit's Progress   . PharmD - Medication Managment       CARE PLAN ENTRY (see longitudinal plan of care for additional care plan information)   Current Barriers:  . Chronic Disease Management support, education, and care coordination needs related to T2DM, CKD, HLD, hypothyroidism, anxiety and constipation  Pharmacist Clinical Goal(s):  Marland Kitchen Over the next 30 days, patient will work with CM Pharmacist to complete medication review and address needs identified  Interventions: . Comprehensive medication review performed; medication list updated in electronic medical record o Confirms completed 7-day course of Cipro as prescribed by PCP on 9/1 and has since resumed taking Macrobid 100 mg daily as directed - Reports burning with urination resolved since course of Cipro o Reports stopped taking both pramipexole and mirtazapine ~9/1 when had burning with urination as wondered if these new medications were related.  - Reports now planning to resume mirtazapine as directed as urinary pain resolved with course of Cipro and continues to have insomnia o Reports adjusts Miralax and stool softener doses as needed based on bowel movements following direction from [place] not taking Trulance. Reports never started due to cost (not on health plan formulary) - Reports not currently constipated, rather having soft, but formed stools.  - Encourage to continue to follow up with provider as needed.  o Reports previously held ferrous sulfate supplement due to constipation, but planning  to restart now that constipation improved. o Confirms taking omeprazole 40 mg twice daily as directed by [place] no longer taking glimepiride. Note this medication discontinued by PCP on 8/25 to reduce risk of hypoglycemia . Discuss importance of medication adherence o Reports uses weekly pillbox to organize morning medications. Reports fills pillbox herself each weekend. Denies difficulty with visualizing medication or other barriers to managing this herself o Encourage patient to start using weekly pillbox for evening medications as well, rather than taking from bottle o Confirms taking levothyroxine 30 minutes prior to food or other medications . Reports she no longer has caregiver throughout the day, but that son comes daily after work ~4pm to help her o Reports working with Education officer, museum with [place] and will reach out to her in future when she is ready to find a new caregiver . Note patient scheduled to see Elmyra Ricks tomorrow for evaluation following recent fall . Provider and Inter-disciplinary care team collaboration (see longitudinal plan of care)  Patient Self Care Activities:  . Attends all scheduled provider appointments o Next appointment with PCP office on 9/16 o Next appointment with Urology on 11/8 o Next appointment with GI on 12/2  Please see past updates related to this goal by clicking on the "Past Updates" button in the selected goal         Patient verbalizes understanding of instructions provided today.   The care management team will reach out to the patient again over the next 30 days.   Harlow Asa, PharmD, [facility] [phone]

## 2019-10-04 NOTE — Chronic Care Management (AMB) (Signed)
Chronic Care Management   Follow Up Note   10/04/2019 Name: Lisa Mills MRN: [id number] DOB: 05/10/1929  Referred by: Karamalegos, Alexander J, DO Reason for referral : Chronic Care Management (Patient Phone Call)   Lisa Mills is a 86 y.o. year old female who is a primary care patient of Karamalegos, Alexander J, DO. The CCM team was consulted for assistance with chronic disease management and care coordination needs.    I reached out to Lisa Mills by phone today.   Review of patient status, including review of consultants reports, relevant laboratory and other test results, and collaboration with appropriate care team members and the patient's provider was performed as part of comprehensive patient evaluation and provision of chronic care management services.    SDOH (Social Determinants of Health) assessments performed: No See Care Plan activities for detailed interventions related to SDOH)   Objective  Lab Results  Component Value Date   CREATININE 0.94 (H) 09/06/2019   BUN 15 09/06/2019   NA 136 09/06/2019   K 4.1 09/06/2019   CL 102 09/06/2019   CO2 26 09/06/2019    Last thyroid functions Lab Results  Component Value Date   TSH 5.61 (H) 09/06/2019   T4, free 1.3 09/06/2019     Lab Results  Component Value Date   CHOL 168 09/06/2019   HDL 66 09/06/2019   LDLCALC 75 09/06/2019   TRIG 173 (H) 09/06/2019   CHOLHDL 2.5 09/06/2019    Outpatient Encounter Medications as of 10/04/2019  Medication Sig  . cholecalciferol (VITAMIN D) 1000 units tablet Take 2,000 Units by mouth daily.   . escitalopram (LEXAPRO) 5 MG tablet TAKE 1 TABLET BY MOUTH ONCE DAILY WITH FOOD (Patient taking differently: Take 5 mg by mouth daily. )  . feeding supplement, ENSURE ENLIVE, (ENSURE ENLIVE) LIQD Take 237 mLs by mouth 3 (three) times daily.  . levothyroxine (SYNTHROID) 25 MCG tablet TAKE 1 TABLET BY MOUTH ONCE DAILY ON AN EMPTY STOMACH. WAIT 30 MINUTES BEFORE TAKING OTHER  MEDS. (Patient taking differently: Take 25 mcg by mouth daily before breakfast. TAKE 1 TABLET BY MOUTH ONCE DAILY ON AN EMPTY STOMACH. WAIT 30 MINUTES BEFORE TAKING OTHER MEDS.)  . lisinopril (ZESTRIL) 5 MG tablet Take 5 mg by mouth daily.  . nitrofurantoin (MACRODANTIN) 100 MG capsule Take 1 capsule (100 mg total) by mouth daily.  . omeprazole (PRILOSEC) 40 MG capsule Take 1 tablet 30 mins before breakfast and 1 tablet 30 mins before dinner. Take on an empty stomach, without other medications.  . polyethylene glycol (MIRALAX / GLYCOLAX) 17 g packet Take 17 g by mouth daily as needed for moderate constipation or severe constipation.  . simvastatin (ZOCOR) 40 MG tablet TAKE 1 TABLET BY MOUTH AT BEDTIME (Patient taking differently: Take 40 mg by mouth at bedtime. )  . Blood Glucose Monitoring Suppl (ONE TOUCH BASIC SYSTEM) w/Device KIT Use glucometer to check blood sugar daily.  . ferrous sulfate 325 (65 FE) MG tablet Take 325 mg by mouth daily.  (Patient not taking: Reported on 10/04/2019)  . glucose blood (ONE TOUCH ULTRA TEST) test strip CHECK BLOOD SUGAR UP TO 2 TIMES A DAY.  . hydrocortisone (ANUSOL-HC) 2.5 % rectal cream USE 1 APPLICATION RECTALLY TWICE DAILY AS DIRECTED  . hydrocortisone (ANUSOL-HC) 25 MG suppository Place 1 suppository (25 mg total) rectally 2 (two) times daily. For 7 days for hemorrhoid  . hydrocortisone 2.5 % cream Place rectally.   . mirtazapine (REMERON) 15 MG tablet   Take 1 tablet (15 mg total) by mouth at bedtime. (Patient not taking: Reported on 10/04/2019)  . ONETOUCH DELICA LANCETS 57Q MISC 1 each by Does not apply route 3 (three) times daily. Dx:E11.9  . pramipexole (MIRAPEX) 0.25 MG tablet Take 1 tablet (0.25 mg total) by mouth at bedtime. May increase to 2 tabs after 1 week if not improved. For Restless Legs (Patient not taking: Reported on 10/04/2019)  . senna (SENOKOT) 8.6 MG TABS tablet Take 1 tablet (8.6 mg total) by mouth 2 (two) times daily. (Patient not taking:  Reported on 10/04/2019)  . [DISCONTINUED] ciprofloxacin (CIPRO) 250 MG tablet Take 1 tablet (250 mg total) by mouth 2 (two) times daily. For 7 days (Patient not taking: Reported on 10/02/2019)  . [DISCONTINUED] esomeprazole (NEXIUM) 20 MG capsule Take 20 mg by mouth daily at 12 noon. (Patient not taking: Reported on 10/04/2019)  . [DISCONTINUED] Plecanatide (TRULANCE) 3 MG TABS Take 3 mg by mouth daily. (Patient not taking: Reported on 10/04/2019)   No facility-administered encounter medications on file as of 10/04/2019.    Goals Addressed            This Visit's Progress   . PharmD - Medication Managment       CARE PLAN ENTRY (see longitudinal plan of care for additional care plan information)   Current Barriers:  . Chronic Disease Management support, education, and care coordination needs related to T2DM, CKD, HLD, hypothyroidism, anxiety and constipation  Pharmacist Clinical Goal(s):  Marland Kitchen Over the next 30 days, patient will work with CM Pharmacist to complete medication review and address needs identified  Interventions: . Comprehensive medication review performed; medication list updated in electronic medical record o Confirms completed 7-day course of Cipro as prescribed by PCP on 9/1 and has since resumed taking Macrobid 100 mg daily as directed - Reports burning with urination resolved since course of Cipro - Note patient followed by Urology. Last seen on 9/13 - plan to continue Macrodantin prophylaxis and reassess at next appointment o Reports stopped taking both pramipexole and mirtazapine ~9/1 when had burning with urination as wondered if these new medications were related.  - Reports now planning to resume mirtazapine as directed as urinary pain resolved with course of Cipro and continues to have insomnia o Reports adjusts Miralax and stool softener doses as needed based on bowel movements following direction from [place] not taking Trulance. Reports never  started due to cost (not on health plan formulary) - Reports not currently constipated, rather having soft, but formed stools.  - Encourage to continue to follow up with provider as needed.  o Reports previously held ferrous sulfate supplement due to constipation, but planning to restart now that constipation improved. o Confirms taking omeprazole 40 mg twice daily as directed by [facility] no longer taking glimepiride. Note this medication discontinued by PCP on 8/25 to reduce risk of hypoglycemia . Discuss importance of medication adherence o Reports uses weekly pillbox to organize morning medications. Reports fills pillbox herself each weekend. Denies difficulty with visualizing medication or other barriers to managing this herself o Encourage patient to start using weekly pillbox for evening medications as well, rather than taking from bottle o Confirms taking levothyroxine 30 minutes prior to food or other medications . Reports she no longer has caregiver throughout the day, but that son comes daily after work ~4pm to help her o Reports working with Education officer, museum with [place] and will reach out to  her in future when she is ready to find a new caregiver . Note patient scheduled to see Nicole tomorrow for evaluation following recent fall . Provider and Inter-disciplinary care team collaboration (see longitudinal plan of care)  Patient Self Care Activities:  . Attends all scheduled provider appointments o Next appointment with PCP office on 9/16 o Next appointment with Urology on 11/8 o Next appointment with GI on 12/2  Please see past updates related to this goal by clicking on the "Past Updates" button in the selected goal         Plan  The care management team will reach out to the patient again over the next 30 days.   Elisabeth Dhalla, PharmD, BCACP Clinical Pharmacist [facility] [phone] 

## 2019-10-05 ENCOUNTER — Other Ambulatory Visit: Payer: Self-pay

## 2019-10-05 ENCOUNTER — Encounter: Payer: Self-pay | Admitting: Family Medicine

## 2019-10-05 ENCOUNTER — Ambulatory Visit (INDEPENDENT_AMBULATORY_CARE_PROVIDER_SITE_OTHER): Payer: PPO | Admitting: Family Medicine

## 2019-10-05 VITALS — BP 112/68 | HR 72 | Temp 98.3°F | Resp 18 | Ht 59.0 in | Wt 107.0 lb

## 2019-10-05 DIAGNOSIS — R519 Headache, unspecified: Secondary | ICD-10-CM

## 2019-10-05 DIAGNOSIS — R32 Unspecified urinary incontinence: Secondary | ICD-10-CM

## 2019-10-05 DIAGNOSIS — F5104 Psychophysiologic insomnia: Secondary | ICD-10-CM | POA: Diagnosis not present

## 2019-10-05 MED ORDER — SALONPAS 3.1-6-10 % EX PTCH: 1.0000 | MEDICATED_PATCH | Freq: Two times a day (BID) | CUTANEOUS | 1 refills | Status: DC

## 2019-10-05 MED ORDER — TRAZODONE HCL 50 MG PO TABS: 25.0000 mg | ORAL_TABLET | Freq: Every evening | ORAL | 3 refills | Status: DC | PRN

## 2019-10-05 NOTE — Patient Instructions (Signed)
As we discussed, I have switched you from mirtazapine to trazodone for night to help with sleep.  Can take 1/2 to 1 tablet (25-50mg ) 1 hour before bed.  I would encourage you to sleep with an incontinence pad to help with nighttime urination so you do not have to change your clothes/sheets until you see Urology again.  Can use topical salonpas patches on the upper trapezius muscles to help with tension headaches.  Can also use these patches on the lower back as needed for discomfort.  They are good for 12 hours on, 12 hours off  Call and schedule a follow up appointment with Dr. Parks Ranger in the next 1-2 months to review your insomnia  You will receive a survey after today's visit either digitally by e-mail or paper by USPS mail. Your experiences and feedback matter to [place].  Please respond so we know how we are doing as we provide care for you.  Call us with any questions/concerns/needs.  It is my goal to be available to you for your health concerns.  Thanks for choosing me to be a partner in your healthcare needs!  Harlin Rain, FNP-C Family Nurse Practitioner [facility] Phone: [phone]

## 2019-10-05 NOTE — Progress Notes (Signed)
Subjective:    Patient ID: Lisa Mills, female    DOB: January 01, 1930, 86 y.o.   MRN: [id number]  Lisa Mills is a 86 y.o. female presenting on 10/05/2019 for Urinary Incontinence (several episodes of urinary incontinence after taking a sleeping pill to help with insomnia lastnight.  She state her buttock hurt worse and she felt s little off when she woke up this morning ) and Headache (intermittent headaches. Pt contributes the headaches from a fall that happen on memorial day. She fell back and hit her head. Her last headache was yesterday. )   HPI  Ms. Haaland presents to clinic for follow up on insomnia and headaches with some increased urinary incontinence.  Is currently following with Urology for her incontinence.  Has concerns for insomnia, had taken mirtazapine last evening and does not believe it has helped, believes it may have increased her urinary frequency.    Has intermittent headaches that come/go since she had fallen on [place] and hit her head.  Reports headaches are intermittent, spontaneously resolve, when present does not have dizziness, lightheadedness, visual changes.  Do not present with a sudden onset and headache is not present at today's visit.  Depression screen [facility] 2/9 09/14/2019 09/07/2019 03/15/2019  Decreased Interest 0 0 0  Down, Depressed, Hopeless 0 0 0  PHQ - 2 Score 0 0 0  Altered sleeping 3 - 0  Tired, decreased energy 1 - 0  Change in appetite 2 - 0  Feeling bad or failure about yourself  0 - 0  Trouble concentrating 0 - 0  Moving slowly or fidgety/restless 0 - 0  Suicidal thoughts 0 - 0  PHQ-9 Score 6 - 0  Difficult doing work/chores Somewhat difficult - Not difficult at all  Some recent data might be hidden    Social History   Tobacco Use  . Smoking status: Never Smoker  . Smokeless tobacco: Never Used  Vaping Use  . Vaping Use: Never used  Substance Use Topics  . Alcohol use: Not Currently  . Drug use: No    Review of Systems    Constitutional: Negative.   HENT: Negative.   Eyes: Negative.   Respiratory: Negative.   Cardiovascular: Negative.   Gastrointestinal: Negative.   Endocrine: Negative.   Genitourinary: Negative.   Musculoskeletal: Negative.   Skin: Negative.   Allergic/Immunologic: Negative.   Neurological: Positive for headaches. Negative for dizziness, tremors, seizures, syncope, facial asymmetry, speech difficulty, weakness, light-headedness and numbness.  Hematological: Negative.   Psychiatric/Behavioral: Positive for sleep disturbance. Negative for agitation, behavioral problems, confusion, decreased concentration, dysphoric mood, hallucinations, self-injury and suicidal ideas. The patient is not nervous/anxious and is not hyperactive.    Per HPI unless specifically indicated above     Objective:    BP 112/68 (BP Location: Right Arm, Patient Position: Sitting, Cuff Size: Normal)   Pulse 72   Temp 98.3 F (36.8 C) (Oral)   Resp 18   Ht 4\' 11"  (1.499 m)   Wt 107 lb (48.5 kg)   SpO2 97%   BMI 21.61 kg/m   Wt Readings from Last 3 Encounters:  10/05/19 107 lb (48.5 kg)  10/02/19 110 lb (49.9 kg)  09/13/19 110 lb 9.6 oz (50.2 kg)    Physical Exam Vitals reviewed.  Constitutional:      General: She is not in acute distress.    Appearance: Normal appearance. She is well-developed, well-groomed and normal weight. She is not ill-appearing or toxic-appearing.  HENT:  Head: Normocephalic and atraumatic.     Nose:     Comments: Lizbeth Bark is in place, covering mouth and nose. Eyes:     General: Lids are normal. Vision grossly intact.        Right eye: No discharge.        Left eye: No discharge.     Extraocular Movements: Extraocular movements intact.     Conjunctiva/sclera: Conjunctivae normal.     Pupils: Pupils are equal, round, and reactive to light.  Cardiovascular:     Rate and Rhythm: Normal rate and regular rhythm.     Pulses: Normal pulses.          Dorsalis pedis pulses are  2+ on the right side and 2+ on the left side.     Heart sounds: Normal heart sounds. No murmur heard.  No friction rub. No gallop.   Pulmonary:     Effort: Pulmonary effort is normal. No respiratory distress.     Breath sounds: Normal breath sounds.  Musculoskeletal:     Cervical back: Normal.     Thoracic back: Normal.     Lumbar back: Normal.       Back:     Right lower leg: No edema.     Left lower leg: No edema.  Skin:    General: Skin is warm and dry.     Capillary Refill: Capillary refill takes less than 2 seconds.  Neurological:     General: No focal deficit present.     Mental Status: She is alert and oriented to person, place, and time.  Psychiatric:        Attention and Perception: Attention and perception normal.        Mood and Affect: Mood and affect normal.        Speech: Speech normal.        Behavior: Behavior normal. Behavior is cooperative.        Thought Content: Thought content normal.        Cognition and Memory: Cognition and memory normal.        Judgment: Judgment normal.    Results for orders placed or performed in visit on 10/02/19  CULTURE, URINE COMPREHENSIVE   Specimen: Urine   UR  Result Value Ref [facility]   Urine Culture, Comprehensive Final report    Organism ID, Bacteria Comment   Microscopic Examination   Urine  Result Value Ref [facility]   WBC, UA 0-5 0 - 5 /hpf   RBC 0-2 0 - 2 /hpf   Epithelial Cells (non renal) 0-10 0 - 10 /hpf   Bacteria, UA Few None seen/Few  Urinalysis, Complete  Result Value Ref [facility]   Specific Gravity, UA 1.015 1.005 - 1.030   pH, UA 7.0 5.0 - 7.5   Color, UA Yellow Yellow   Appearance Ur Hazy (A) Clear   Leukocytes,UA Trace (A) Negative   Protein,UA Trace (A) Negative/Trace   Glucose, UA Negative Negative   Ketones, UA Negative Negative   RBC, UA Negative Negative   Bilirubin, UA Negative Negative   Urobilinogen, Ur 0.2 0.2 - 1.0 mg/dL   Nitrite, UA Negative Negative   Microscopic Examination See below:         Assessment & Plan:   Problem List Items Addressed This Visit      Other   Psychophysiological insomnia - Primary    Believes was exacerbated with mirtazapine.  Discussed discontinuation of this prescription and beginning to use Trazodone 25-50mg  at bedtime.  Son at appointment with patient and both agreeable to change in medication.    Plan: 1. STOP Mirtazapine and START trazodone 25-50mg  at bedtime. 2. Keep scheduled follow up appointment with Dr. Parks Ranger      Relevant Medications   traZODone (DESYREL) 50 MG tablet   Nonintractable headache    Likely tension type headache related to tenderness/tightness in bilateral trapezius muscles.  Encouraged topical use of salonpas patches.  Will try these and if no improvement in symptoms, will return to clinic.  Plan: 1. Can use topical Salonpas patches to bilateral trapezius.   2. RTC if symptoms worsen or fail to improve      Relevant Medications   traZODone (DESYREL) 50 MG tablet   Camphor-Menthol-Methyl Sal (SALONPAS) 3.01-25-08 % PTCH   Urinary incontinence    Following with Urology, encouraged to contact office for follow up         Meds ordered this encounter  Medications  . traZODone (DESYREL) 50 MG tablet    Sig: Take 0.5-1 tablets (25-50 mg total) by mouth at bedtime as needed for sleep.    Dispense:  30 tablet    Refill:  3  . Camphor-Menthol-Methyl Sal (SALONPAS) 3.01-25-08 % PTCH    Sig: Apply 1 patch topically 2 (two) times daily.    Dispense:  60 patch    Refill:  1    Follow up plan: Return in about 2 months (around 12/05/2019) for Insomnia follow up visit.   Harlin Rain, [place] Family Nurse Practitioner [facility] 10/05/2019, 4:53 PM

## 2019-10-06 DIAGNOSIS — F5104 Psychophysiologic insomnia: Secondary | ICD-10-CM | POA: Insufficient documentation

## 2019-10-06 DIAGNOSIS — R519 Headache, unspecified: Secondary | ICD-10-CM | POA: Insufficient documentation

## 2019-10-06 DIAGNOSIS — R32 Unspecified urinary incontinence: Secondary | ICD-10-CM | POA: Insufficient documentation

## 2019-10-06 LAB — CULTURE, URINE COMPREHENSIVE

## 2019-10-06 NOTE — Assessment & Plan Note (Signed)
Likely tension type headache related to tenderness/tightness in bilateral trapezius muscles.  Encouraged topical use of salonpas patches.  Will try these and if no improvement in symptoms, will return to clinic.  Plan: 1. Can use topical Salonpas patches to bilateral trapezius.   2. RTC if symptoms worsen or fail to improve

## 2019-10-06 NOTE — Assessment & Plan Note (Signed)
Following with Urology, encouraged to contact office for follow up

## 2019-10-06 NOTE — Assessment & Plan Note (Signed)
Believes was exacerbated with mirtazapine.  Discussed discontinuation of this prescription and beginning to use Trazodone 25-50mg  at bedtime.  Son at appointment with patient and both agreeable to change in medication.    Plan: 1. STOP Mirtazapine and START trazodone 25-50mg  at bedtime. 2. Keep scheduled follow up appointment with Dr. Parks Ranger

## 2019-10-10 ENCOUNTER — Telehealth: Payer: Self-pay | Admitting: Licensed Clinical Social Worker

## 2019-10-10 ENCOUNTER — Telehealth: Payer: PPO

## 2019-10-10 NOTE — Telephone Encounter (Signed)
Chronic Care Management    Clinical Social Work General Follow Up Note  10/10/2019 Name: Lisa Mills MRN: [id number] DOB: Jun 17, 1929  Lisa Mills is a 86 y.o. year old female who is a primary care patient of Olin Hauser, DO. The CCM team was consulted for assistance with [employer]  and Grief Counseling.   Review of patient status, including review of consultants reports, relevant laboratory and other test results, and collaboration with appropriate care team members and the patient's provider was performed as part of comprehensive patient evaluation and provision of chronic care management services.    LCSW completed CCM outreach attempt today but was unable to reach patient successfully. A HIPPA compliant voice message was left encouraging patient to return call once available. LCSW will ask Scheduling Care Guide to reschedule CCM SW appointment with patient as well.  Outpatient Encounter Medications as of 10/10/2019  Medication Sig   Blood Glucose Monitoring Suppl ([place]) w/Device KIT Use glucometer to check blood sugar daily.   Camphor-Menthol-Methyl Sal (SALONPAS) 3.01-25-08 % PTCH Apply 1 patch topically 2 (two) times daily.   cholecalciferol (VITAMIN D) 1000 units tablet Take 2,000 Units by mouth daily.    escitalopram (LEXAPRO) 5 MG tablet TAKE 1 TABLET BY MOUTH ONCE DAILY WITH FOOD (Patient taking differently: Take 5 mg by mouth daily. )   feeding supplement, ENSURE ENLIVE, (ENSURE ENLIVE) LIQD Take 237 mLs by mouth 3 (three) times daily.   ferrous sulfate 325 (65 FE) MG tablet Take 325 mg by mouth daily.  (Patient not taking: Reported on 10/05/2019)   glucose blood (ONE TOUCH ULTRA TEST) test strip CHECK BLOOD SUGAR UP TO 2 TIMES A DAY.   hydrocortisone (ANUSOL-HC) 2.5 % rectal cream USE 1 APPLICATION RECTALLY TWICE DAILY AS DIRECTED   hydrocortisone (ANUSOL-HC) 25 MG suppository Place 1 suppository (25 mg total) rectally 2 (two)  times daily. For 7 days for hemorrhoid   hydrocortisone 2.5 % cream Place rectally.    levothyroxine (SYNTHROID) 25 MCG tablet TAKE 1 TABLET BY MOUTH ONCE DAILY ON AN EMPTY STOMACH. WAIT 30 MINUTES BEFORE TAKING OTHER MEDS. (Patient taking differently: Take 25 mcg by mouth daily before breakfast. TAKE 1 TABLET BY MOUTH ONCE DAILY ON AN EMPTY STOMACH. WAIT 30 MINUTES BEFORE TAKING OTHER MEDS.)   lisinopril (ZESTRIL) 5 MG tablet Take 5 mg by mouth daily.   nitrofurantoin (MACRODANTIN) 100 MG capsule Take 1 capsule (100 mg total) by mouth daily.   omeprazole (PRILOSEC) 40 MG capsule Take 1 tablet 30 mins before breakfast and 1 tablet 30 mins before dinner. Take on an empty stomach, without other medications.   ONETOUCH DELICA LANCETS 60F MISC 1 each by Does not apply route 3 (three) times daily. Dx:E11.9   polyethylene glycol (MIRALAX / GLYCOLAX) 17 g packet Take 17 g by mouth daily as needed for moderate constipation or severe constipation.   pramipexole (MIRAPEX) 0.25 MG tablet Take 1 tablet (0.25 mg total) by mouth at bedtime. May increase to 2 tabs after 1 week if not improved. For Restless Legs (Patient not taking: Reported on 10/05/2019)   senna (SENOKOT) 8.6 MG TABS tablet Take 1 tablet (8.6 mg total) by mouth 2 (two) times daily.   simvastatin (ZOCOR) 40 MG tablet TAKE 1 TABLET BY MOUTH AT BEDTIME (Patient taking differently: Take 40 mg by mouth at bedtime. )   traZODone (DESYREL) 50 MG tablet Take 0.5-1 tablets (25-50 mg total) by mouth at bedtime as needed for sleep.  No facility-administered encounter medications on file as of 10/10/2019.    Follow Up Plan: [facility] will reach out to patient to reschedule appointment.   Eula Fried, BSW, MSW, [email or web address] Phone: [phone]

## 2019-10-16 ENCOUNTER — Ambulatory Visit (INDEPENDENT_AMBULATORY_CARE_PROVIDER_SITE_OTHER): Payer: PPO | Admitting: Family Medicine

## 2019-10-16 ENCOUNTER — Encounter: Payer: Self-pay | Admitting: Family Medicine

## 2019-10-16 ENCOUNTER — Telehealth: Payer: Self-pay

## 2019-10-16 DIAGNOSIS — G2581 Restless legs syndrome: Secondary | ICD-10-CM | POA: Diagnosis not present

## 2019-10-16 DIAGNOSIS — D631 Anemia in chronic kidney disease: Secondary | ICD-10-CM | POA: Diagnosis not present

## 2019-10-16 DIAGNOSIS — F5104 Psychophysiologic insomnia: Secondary | ICD-10-CM

## 2019-10-16 DIAGNOSIS — K5903 Drug induced constipation: Secondary | ICD-10-CM

## 2019-10-16 DIAGNOSIS — N1831 Chronic kidney disease, stage 3a: Secondary | ICD-10-CM | POA: Diagnosis not present

## 2019-10-16 NOTE — Progress Notes (Signed)
Virtual Visit via Telephone The purpose of this virtual visit is to provide medical care while limiting exposure to the novel coronavirus (COVID19) for both patient and office staff.  Consent was obtained for phone visit:  Yes.   Answered questions that patient had about telehealth interaction:  Yes.   I discussed the limitations, risks, security and privacy concerns of performing an evaluation and management service by telephone. I also discussed with the patient that there may be a patient responsible charge related to this service. The patient expressed understanding and agreed to proceed.  Patient Location: Home Provider Location: [facility])  ---------------------------------------------------------------------- Chief Complaint  Patient presents with  . Insomnia  . Constipation    S: Reviewed CMA documentation. I have called patient and gathered additional HPI as follows:  Care Team - Lee Vining NP - Chronic Care Management Team  Insomnia Anxiety with Mood/Depression recurrent RLS Anemia  Interval update, 10/05/19 was last visit here, seen Cyndia Skeeters, FNP, she was switched from Mirtazapine now to Trazodone, she tried Trazodone half pill PRN with some good results.  She is off Mirtazapine, Pramipexole, Gabapentin  She has not restarted ferrous sulfate, last lab 08/2019 showed persistent anemia Hgb 9.8  On escitalopram She denies any other hypoglycemia She has had reduced RLS tremors now with better sleep  Constipation Followed by [facility], last phone call 09/2019 with updates, she is on multiple stool softeners and miralax, she has significant clean out recently over weekend.   Denies any known or suspected exposure to person with or possibly with COVID19.  Denies any fevers, chills, sweats, body ache, cough, shortness of breath, sinus pain or pressure, headache, abdominal pain, diarrhea  Past  Medical History:  Diagnosis Date  . Dermatophytosis of nail   . GERD (gastroesophageal reflux disease)   . History of shingles   . Hypertension   . Keratoderma    Social History   Tobacco Use  . Smoking status: Never Smoker  . Smokeless tobacco: Never Used  Vaping Use  . Vaping Use: Never used  Substance Use Topics  . Alcohol use: Not Currently  . Drug use: No    Current Outpatient Medications:  .  Blood Glucose Monitoring Suppl ([place]) w/Device KIT, Use glucometer to check blood sugar daily., Disp: 1 each, Rfl: 0 .  Camphor-Menthol-Methyl Sal (SALONPAS) 3.01-25-08 % PTCH, Apply 1 patch topically 2 (two) times daily., Disp: 60 patch, Rfl: 1 .  cholecalciferol (VITAMIN D) 1000 units tablet, Take 2,000 Units by mouth daily. , Disp: , Rfl:  .  escitalopram (LEXAPRO) 5 MG tablet, TAKE 1 TABLET BY MOUTH ONCE DAILY WITH FOOD (Patient taking differently: Take 5 mg by mouth daily. ), Disp: 90 tablet, Rfl: 3 .  feeding supplement, ENSURE ENLIVE, (ENSURE ENLIVE) LIQD, Take 237 mLs by mouth 3 (three) times daily., Disp: , Rfl:  .  ferrous sulfate 325 (65 FE) MG tablet, Take 325 mg by mouth daily. , Disp: , Rfl:  .  glucose blood (ONE TOUCH ULTRA TEST) test strip, CHECK BLOOD SUGAR UP TO 2 TIMES A DAY., Disp: 100 each, Rfl: 11 .  hydrocortisone (ANUSOL-HC) 2.5 % rectal cream, USE 1 APPLICATION RECTALLY TWICE DAILY AS DIRECTED, Disp: 30 g, Rfl: 0 .  hydrocortisone (ANUSOL-HC) 25 MG suppository, Place 1 suppository (25 mg total) rectally 2 (two) times daily. For 7 days for hemorrhoid, Disp: 14 suppository, Rfl: 0 .  hydrocortisone 2.5 % cream, Place rectally. ,  Disp: , Rfl:  .  levothyroxine (SYNTHROID) 25 MCG tablet, TAKE 1 TABLET BY MOUTH ONCE DAILY ON AN EMPTY STOMACH. WAIT 30 MINUTES BEFORE TAKING OTHER MEDS. (Patient taking differently: Take 25 mcg by mouth daily before breakfast. TAKE 1 TABLET BY MOUTH ONCE DAILY ON AN EMPTY STOMACH. WAIT 30 MINUTES BEFORE TAKING OTHER MEDS.),  Disp: 90 tablet, Rfl: 2 .  lisinopril (ZESTRIL) 5 MG tablet, Take 5 mg by mouth daily., Disp: , Rfl:  .  nitrofurantoin (MACRODANTIN) 100 MG capsule, Take 1 capsule (100 mg total) by mouth daily., Disp: 30 capsule, Rfl: 11 .  omeprazole (PRILOSEC) 40 MG capsule, Take 1 tablet 30 mins before breakfast and 1 tablet 30 mins before dinner. Take on an empty stomach, without other medications., Disp: , Rfl:  .  ONETOUCH DELICA LANCETS 77O MISC, 1 each by Does not apply route 3 (three) times daily. Dx:E11.9, Disp: 100 each, Rfl: 12 .  polyethylene glycol (MIRALAX / GLYCOLAX) 17 g packet, Take 17 g by mouth daily as needed for moderate constipation or severe constipation., Disp: 14 each, Rfl: 0 .  senna (SENOKOT) 8.6 MG TABS tablet, Take 1 tablet (8.6 mg total) by mouth 2 (two) times daily., Disp: 120 tablet, Rfl: 0 .  simvastatin (ZOCOR) 40 MG tablet, TAKE 1 TABLET BY MOUTH AT BEDTIME (Patient taking differently: Take 40 mg by mouth at bedtime. ), Disp: 90 tablet, Rfl: 3 .  traZODone (DESYREL) 50 MG tablet, Take 0.5-1 tablets (25-50 mg total) by mouth at bedtime as needed for sleep., Disp: 30 tablet, Rfl: 3  Depression screen [facility] 2/9 09/14/2019 09/07/2019 03/15/2019  Decreased Interest 0 0 0  Down, Depressed, Hopeless 0 0 0  PHQ - 2 Score 0 0 0  Altered sleeping 3 - 0  Tired, decreased energy 1 - 0  Change in appetite 2 - 0  Feeling bad or failure about yourself  0 - 0  Trouble concentrating 0 - 0  Moving slowly or fidgety/restless 0 - 0  Suicidal thoughts 0 - 0  PHQ-9 Score 6 - 0  Difficult doing work/chores Somewhat difficult - Not difficult at all  Some recent data might be hidden    GAD 7 : Generalized Anxiety Score 06/06/2018 03/02/2018 12/01/2016 10/06/2016  Nervous, Anxious, on Edge (No Data) 0 0 0  Control/stop worrying - 0 1 0  Worry too much - different things - 0 0 0  Trouble relaxing - 0 0 0  Restless - 0 0 0  Easily annoyed or irritable - 0 0 0  Afraid - awful might happen - 0 0 0   Total GAD 7 Score - 0 1 0  Anxiety Difficulty - Not difficult at all Not difficult at all Not difficult at all    -------------------------------------------------------------------------- O: No physical exam performed due to remote telephone encounter.  Lab results reviewed.  Recent Results (from the past 2160 hour(s))  Glucose, capillary     Status: Abnormal   Collection Time: 07/18/19  5:07 PM  Result Value Ref Range   Glucose-Capillary 123 (H) 70 - 99 mg/dL    Comment: Glucose reference range applies only to samples taken after fasting for at least 8 hours.  Glucose, capillary     Status: Abnormal   Collection Time: 07/18/19  8:15 PM  Result Value Ref Range   Glucose-Capillary 139 (H) 70 - 99 mg/dL    Comment: Glucose reference range applies only to samples taken after fasting for at least 8 hours.  Glucose,  capillary     Status: Abnormal   Collection Time: 07/19/19  8:37 AM  Result Value Ref Range   Glucose-Capillary 131 (H) 70 - 99 mg/dL    Comment: Glucose reference range applies only to samples taken after fasting for at least 8 hours.  Basic metabolic panel     Status: Abnormal   Collection Time: 07/19/19  8:55 AM  Result Value Ref Range   Sodium 123 (L) 135 - 145 mmol/L   Potassium 4.6 3.5 - 5.1 mmol/L   Chloride 89 (L) 98 - 111 mmol/L   CO2 23 22 - 32 mmol/L   Glucose, Bld 234 (H) 70 - 99 mg/dL    Comment: Glucose reference range applies only to samples taken after fasting for at least 8 hours.   BUN 21 8 - 23 mg/dL   Creatinine, Ser 0.27 (H) 0.44 - 1.00 mg/dL   Calcium 8.8 (L) 8.9 - 10.3 mg/dL   GFR calc non Af Amer 37 (L) >60 mL/min   GFR calc Af Amer 43 (L) >60 mL/min   Anion gap 11 5 - 15    Comment: Performed at [facility], [address] [place], [place] [address]  Glucose, capillary     Status: Abnormal   Collection Time: 07/19/19 11:41 AM  Result Value Ref Range   Glucose-Capillary 188 (H) 70 - 99 mg/dL    Comment: Glucose reference range  applies only to samples taken after fasting for at least 8 hours.  Glucose, capillary     Status: Abnormal   Collection Time: 07/19/19  4:36 PM  Result Value Ref Range   Glucose-Capillary 115 (H) 70 - 99 mg/dL    Comment: Glucose reference range applies only to samples taken after fasting for at least 8 hours.  Glucose, capillary     Status: Abnormal   Collection Time: 07/19/19 10:28 PM  Result Value Ref Range   Glucose-Capillary 141 (H) 70 - 99 mg/dL    Comment: Glucose reference range applies only to samples taken after fasting for at least 8 hours.  Glucose, capillary     Status: Abnormal   Collection Time: 07/20/19  7:59 AM  Result Value Ref Range   Glucose-Capillary 110 (H) 70 - 99 mg/dL    Comment: Glucose reference range applies only to samples taken after fasting for at least 8 hours.  Basic metabolic panel     Status: Abnormal   Collection Time: 07/20/19  9:13 AM  Result Value Ref Range   Sodium 127 (L) 135 - 145 mmol/L   Potassium 4.6 3.5 - 5.1 mmol/L   Chloride 91 (L) 98 - 111 mmol/L   CO2 27 22 - 32 mmol/L   Glucose, Bld 126 (H) 70 - 99 mg/dL    Comment: Glucose reference range applies only to samples taken after fasting for at least 8 hours.   BUN 26 (H) 8 - 23 mg/dL   Creatinine, Ser 7.86 (H) 0.44 - 1.00 mg/dL   Calcium 9.0 8.9 - 76.7 mg/dL   GFR calc non Af Amer 38 (L) >60 mL/min   GFR calc Af Amer 44 (L) >60 mL/min   Anion gap 9 5 - 15    Comment: Performed at [facility], [address] [place], [place] [address]  Glucose, capillary     Status: Abnormal   Collection Time: 07/20/19 11:46 AM  Result Value Ref Range   Glucose-Capillary 227 (H) 70 - 99 mg/dL    Comment: Glucose reference range  applies only to samples taken after fasting for at least 8 hours.  Glucose, capillary     Status: Abnormal   Collection Time: 07/20/19  5:06 PM  Result Value Ref Range   Glucose-Capillary 232 (H) 70 - 99 mg/dL    Comment: Glucose reference range applies only to  samples taken after fasting for at least 8 hours.  Glucose, capillary     Status: Abnormal   Collection Time: 07/20/19  9:15 PM  Result Value Ref Range   Glucose-Capillary 129 (H) 70 - 99 mg/dL    Comment: Glucose reference range applies only to samples taken after fasting for at least 8 hours.  Basic metabolic panel     Status: Abnormal   Collection Time: 07/21/19  4:08 AM  Result Value Ref Range   Sodium 128 (L) 135 - 145 mmol/L   Potassium 4.7 3.5 - 5.1 mmol/L   Chloride 90 (L) 98 - 111 mmol/L   CO2 27 22 - 32 mmol/L   Glucose, Bld 151 (H) 70 - 99 mg/dL    Comment: Glucose reference range applies only to samples taken after fasting for at least 8 hours.   BUN 33 (H) 8 - 23 mg/dL   Creatinine, Ser 1.48 (H) 0.44 - 1.00 mg/dL   Calcium 9.1 8.9 - 10.3 mg/dL   GFR calc non Af Amer 32 (L) >60 mL/min   GFR calc Af Amer 37 (L) >60 mL/min   Anion gap 11 5 - 15    Comment: Performed at [facility], [address] [place], [place] [address]  Glucose, capillary     Status: Abnormal   Collection Time: 07/21/19  7:53 AM  Result Value Ref Range   Glucose-Capillary 123 (H) 70 - 99 mg/dL    Comment: Glucose reference range applies only to samples taken after fasting for at least 8 hours.  Glucose, capillary     Status: Abnormal   Collection Time: 07/21/19 11:38 AM  Result Value Ref Range   Glucose-Capillary 131 (H) 70 - 99 mg/dL    Comment: Glucose reference range applies only to samples taken after fasting for at least 8 hours.  Glucose, capillary     Status: Abnormal   Collection Time: 07/21/19  5:42 PM  Result Value Ref Range   Glucose-Capillary 155 (H) 70 - 99 mg/dL    Comment: Glucose reference range applies only to samples taken after fasting for at least 8 hours.  Glucose, capillary     Status: Abnormal   Collection Time: 07/21/19  9:59 PM  Result Value Ref Range   Glucose-Capillary 172 (H) 70 - 99 mg/dL    Comment: Glucose reference range applies only to samples taken  after fasting for at least 8 hours.  Basic metabolic panel     Status: Abnormal   Collection Time: 07/22/19  5:32 AM  Result Value Ref Range   Sodium 124 (L) 135 - 145 mmol/L   Potassium 5.6 (H) 3.5 - 5.1 mmol/L   Chloride 86 (L) 98 - 111 mmol/L   CO2 27 22 - 32 mmol/L   Glucose, Bld 165 (H) 70 - 99 mg/dL    Comment: Glucose reference range applies only to samples taken after fasting for at least 8 hours.   BUN 54 (H) 8 - 23 mg/dL   Creatinine, Ser 1.75 (H) 0.44 - 1.00 mg/dL   Calcium 9.0 8.9 - 10.3 mg/dL   GFR calc non Af Amer 26 (L) >60 mL/min   GFR calc  Af Amer 30 (L) >60 mL/min   Anion gap 11 5 - 15    Comment: Performed at [facility], [address] [place], [place] [address]  Glucose, capillary     Status: Abnormal   Collection Time: 07/22/19  7:47 AM  Result Value Ref Range   Glucose-Capillary 144 (H) 70 - 99 mg/dL    Comment: Glucose reference range applies only to samples taken after fasting for at least 8 hours.  Glucose, capillary     Status: Abnormal   Collection Time: 07/22/19 11:40 AM  Result Value Ref Range   Glucose-Capillary 227 (H) 70 - 99 mg/dL    Comment: Glucose reference range applies only to samples taken after fasting for at least 8 hours.  Glucose, capillary     Status: Abnormal   Collection Time: 07/22/19  4:26 PM  Result Value Ref Range   Glucose-Capillary 130 (H) 70 - 99 mg/dL    Comment: Glucose reference range applies only to samples taken after fasting for at least 8 hours.  Basic metabolic panel     Status: Abnormal   Collection Time: 07/22/19  6:41 PM  Result Value Ref Range   Sodium 126 (L) 135 - 145 mmol/L   Potassium 5.3 (H) 3.5 - 5.1 mmol/L   Chloride 89 (L) 98 - 111 mmol/L   CO2 28 22 - 32 mmol/L   Glucose, Bld 149 (H) 70 - 99 mg/dL    Comment: Glucose reference range applies only to samples taken after fasting for at least 8 hours.   BUN 55 (H) 8 - 23 mg/dL   Creatinine, Ser 1.58 (H) 0.44 - 1.00 mg/dL   Calcium 8.8 (L) 8.9  - 10.3 mg/dL   GFR calc non Af Amer 29 (L) >60 mL/min   GFR calc Af Amer 34 (L) >60 mL/min   Anion gap 9 5 - 15    Comment: Performed at [facility] [address] [place], [place] [address]  Glucose, capillary     Status: Abnormal   Collection Time: 07/22/19  9:22 PM  Result Value Ref Range   Glucose-Capillary 145 (H) 70 - 99 mg/dL    Comment: Glucose reference range applies only to samples taken after fasting for at least 8 hours.  Basic metabolic panel     Status: Abnormal   Collection Time: 07/23/19  4:18 AM  Result Value Ref Range   Sodium 129 (L) 135 - 145 mmol/L   Potassium 4.6 3.5 - 5.1 mmol/L   Chloride 92 (L) 98 - 111 mmol/L   CO2 28 22 - 32 mmol/L   Glucose, Bld 157 (H) 70 - 99 mg/dL    Comment: Glucose reference range applies only to samples taken after fasting for at least 8 hours.   BUN 47 (H) 8 - 23 mg/dL   Creatinine, Ser 1.37 (H) 0.44 - 1.00 mg/dL   Calcium 9.0 8.9 - 10.3 mg/dL   GFR calc non Af Amer 35 (L) >60 mL/min   GFR calc Af Amer 40 (L) >60 mL/min   Anion gap 9 5 - 15    Comment: Performed at [facility], [address] [place], [place] [address]  Glucose, capillary     Status: Abnormal   Collection Time: 07/23/19  7:28 AM  Result Value Ref Range   Glucose-Capillary 135 (H) 70 - 99 mg/dL    Comment: Glucose reference range applies only to samples taken after fasting for at least 8 hours.  Glucose,  capillary     Status: Abnormal   Collection Time: 07/23/19 11:49 AM  Result Value Ref Range   Glucose-Capillary 168 (H) 70 - 99 mg/dL    Comment: Glucose reference range applies only to samples taken after fasting for at least 8 hours.  Glucose, capillary     Status: Abnormal   Collection Time: 07/23/19  4:12 PM  Result Value Ref Range   Glucose-Capillary 108 (H) 70 - 99 mg/dL    Comment: Glucose reference range applies only to samples taken after fasting for at least 8 hours.  Glucose, capillary     Status: Abnormal   Collection  Time: 07/23/19  9:20 PM  Result Value Ref Range   Glucose-Capillary 133 (H) 70 - 99 mg/dL    Comment: Glucose reference range applies only to samples taken after fasting for at least 8 hours.  Basic metabolic panel     Status: Abnormal   Collection Time: 07/24/19  6:08 AM  Result Value Ref Range   Sodium 130 (L) 135 - 145 mmol/L   Potassium 4.3 3.5 - 5.1 mmol/L   Chloride 96 (L) 98 - 111 mmol/L   CO2 25 22 - 32 mmol/L   Glucose, Bld 103 (H) 70 - 99 mg/dL    Comment: Glucose reference range applies only to samples taken after fasting for at least 8 hours.   BUN 30 (H) 8 - 23 mg/dL   Creatinine, Ser 1.07 (H) 0.44 - 1.00 mg/dL   Calcium 9.1 8.9 - 10.3 mg/dL   GFR calc non Af Amer 47 (L) >60 mL/min   GFR calc Af Amer 54 (L) >60 mL/min   Anion gap 9 5 - 15    Comment: Performed at [facility], [address] [place], [place] [address]  Glucose, capillary     Status: None   Collection Time: 07/24/19  7:49 AM  Result Value Ref Range   Glucose-Capillary 91 70 - 99 mg/dL    Comment: Glucose reference range applies only to samples taken after fasting for at least 8 hours.  Glucose, capillary     Status: Abnormal   Collection Time: 07/24/19 12:01 PM  Result Value Ref Range   Glucose-Capillary 170 (H) 70 - 99 mg/dL    Comment: Glucose reference range applies only to samples taken after fasting for at least 8 hours.  Glucose, capillary     Status: None   Collection Time: 07/24/19  4:23 PM  Result Value Ref Range   Glucose-Capillary 73 70 - 99 mg/dL    Comment: Glucose reference range applies only to samples taken after fasting for at least 8 hours.  Basic metabolic panel     Status: Abnormal   Collection Time: 07/26/19 12:00 AM  Result Value Ref Range   Glucose 75    BUN 21 4 - 21   CO2 20 13 - 22   Creatinine 0.9 0.5 - 1.1   Potassium 3.8 3.4 - 5.3   Sodium 126 (A) 137 - 147   Chloride 89 (A) 99 - 108  Comprehensive metabolic panel     Status: None   Collection Time:  07/26/19 12:00 AM  Result Value Ref Range   Calcium 9.3 8.7 - 10.7   Albumin 3.8 3.5 - 5.0  Hepatic function panel     Status: Abnormal   Collection Time: 07/26/19 12:00 AM  Result Value Ref Range   ALT 18 7 - 35   AST 41 (A) 13 - 35  CBC  and differential     Status: Abnormal   Collection Time: 07/26/19 12:00 AM  Result Value Ref Range   Hemoglobin 9.4 (A) 12.0 - 16.0   HCT 28 (A) 36 - 46   WBC 11.7   CBC and differential     Status: Abnormal   Collection Time: 07/26/19 12:00 AM  Result Value Ref Range   Platelets 536 (A) 150 - 399  Urine Culture     Status: None   Collection Time: 07/26/19 12:00 AM   Specimen: Urine  Result Value Ref Range   Urine Culture Result Klebsiella >100CFU     Comment: Resistant to Ampicillin. On Augmentin treatment.  Osmolality     Status: Abnormal   Collection Time: 07/26/19 12:00 AM  Result Value Ref Range   Osmolality 254.9 (A) 285 - 656  Basic metabolic panel     Status: Abnormal   Collection Time: 07/27/19 12:00 AM  Result Value Ref Range   Glucose 89    BUN 20 4 - 21   CO2 20 13 - 22   Creatinine 0.9 0.5 - 1.1   Potassium 4.4 3.4 - 5.3   Sodium 121 (A) 137 - 147   Chloride 89 (A) 99 - 108  Comprehensive metabolic panel     Status: None   Collection Time: 07/27/19 12:00 AM  Result Value Ref Range   Calcium 9.2 8.7 - 10.7   Albumin 3.6 3.5 - 5.0  Basic metabolic panel     Status: Abnormal   Collection Time: 07/28/19 12:00 AM  Result Value Ref Range   Glucose 88    BUN 21 4 - 21   CO2 21 13 - 22   Creatinine 0.9 0.5 - 1.1   Potassium 4.3 3.4 - 5.3   Sodium 124 (A) 137 - 147   Chloride 89 (A) 99 - 108  COMPLETE METABOLIC PANEL WITH GFR     Status: Abnormal   Collection Time: 08/08/19  9:02 AM  Result Value Ref Range   Glucose, Bld 287 (H) 65 - 99 mg/dL    Comment: .            Fasting reference interval . For someone without known diabetes, a glucose value >125 mg/dL indicates that they may have diabetes and this should be  confirmed with a follow-up test. .    BUN 15 7 - 25 mg/dL   Creat 1.23 (H) 0.60 - 0.88 mg/dL    Comment: For patients >85 years of age, the reference limit for Creatinine is approximately 13% higher for people identified as African-American. .    GFR, Est Non African American 39 (L) > OR = 60 mL/min/1.64m2   GFR, Est African American 46 (L) > OR = 60 mL/min/1.41m2   BUN/Creatinine Ratio 12 6 - 22 (calc)   Sodium 127 (L) 135 - 146 mmol/L   Potassium 5.2 3.5 - 5.3 mmol/L   Chloride 92 (L) 98 - 110 mmol/L   CO2 23 20 - 32 mmol/L   Calcium 9.5 8.6 - 10.4 mg/dL   Total Protein 6.3 6.1 - 8.1 g/dL   Albumin 4.0 3.6 - 5.1 g/dL   Globulin 2.3 1.9 - 3.7 g/dL (calc)   AG Ratio 1.7 1.0 - 2.5 (calc)   Total Bilirubin 0.5 0.2 - 1.2 mg/dL   Alkaline phosphatase (APISO) 90 37 - 153 U/L   AST 16 10 - 35 U/L   ALT 13 6 - 29 U/L  CBC with Differential/Platelet  Status: Abnormal   Collection Time: 08/08/19  9:02 AM  Result Value Ref Range   WBC 10.8 3.8 - 10.8 Thousand/uL   RBC 3.15 (L) 3.80 - 5.10 Million/uL   Hemoglobin 9.9 (L) 11.7 - 15.5 g/dL   HCT 30.4 (L) 35 - 45 %   MCV 96.5 80.0 - 100.0 fL   MCH 31.4 27.0 - 33.0 pg   MCHC 32.6 32.0 - 36.0 g/dL   RDW 13.0 11.0 - 15.0 %   Platelets 487 (H) 140 - 400 Thousand/uL   MPV 8.0 7.5 - 12.5 fL   Neutro Abs 6,847 1,500 - 7,800 cells/uL   Lymphs Abs 2,948 850 - 3,900 cells/uL   Absolute Monocytes 832 200 - 950 cells/uL   Eosinophils Absolute 119 15 - 500 cells/uL   Basophils Absolute 54 0 - 200 cells/uL   Neutrophils Relative % 63.4 %   Total Lymphocyte 27.3 %   Monocytes Relative 7.7 %   Eosinophils Relative 1.1 %   Basophils Relative 0.5 %  Urinalysis, Complete     Status: Abnormal   Collection Time: 08/14/19  2:54 PM  Result Value Ref Range   Specific Gravity, UA 1.015 1.005 - 1.030   pH, UA 5.5 5.0 - 7.5   Color, UA Yellow Yellow   Appearance Ur Cloudy (A) Clear   Leukocytes,UA 1+ (A) Negative   Protein,UA Trace (A)  Negative/Trace   Glucose, UA Negative Negative   Ketones, UA Negative Negative   RBC, UA Negative Negative   Bilirubin, UA Negative Negative   Urobilinogen, Ur 0.2 0.2 - 1.0 mg/dL   Nitrite, UA Positive (A) Negative   Microscopic Examination See below:   CULTURE, URINE COMPREHENSIVE     Status: Abnormal   Collection Time: 08/14/19  2:54 PM   Specimen: Urine   UR  Result Value Ref Range   Urine Culture, Comprehensive Final report (A)    Organism ID, Bacteria Escherichia coli (A)     Comment: Multi-Drug Resistant Organism Greater than 100,000 colony forming units per mL Cefazolin with an MIC <=16 predicts susceptibility to the oral agents cefaclor, cefdinir, cefpodoxime, cefprozil, cefuroxime, cephalexin, and loracarbef when used for therapy of uncomplicated urinary tract infections due to E. coli, Klebsiella pneumoniae, and Proteus mirabilis.    ANTIMICROBIAL SUSCEPTIBILITY Comment     Comment:       ** S = Susceptible; I = Intermediate; R = Resistant **                    P = Positive; N = Negative             MICS are expressed in micrograms per mL    Antibiotic                 RSLT#1    RSLT#2    RSLT#3    RSLT#4 Amoxicillin/Clavulanic Acid    I Ampicillin                     R Cefazolin                      S Cefepime                       S Ceftriaxone                    S Cefuroxime  S Ciprofloxacin                  S Ertapenem                      S Gentamicin                     S Imipenem                       S Levofloxacin                   S Meropenem                      S Nitrofurantoin                 S Piperacillin/Tazobactam        R Tetracycline                   R Tobramycin                     S Trimethoprim/Sulfa             R   Microscopic Examination     Status: Abnormal   Collection Time: 08/14/19  2:54 PM   Urine  Result Value Ref Range   WBC, UA >30 (A) 0 - 5 /hpf   RBC 0-2 0 - 2 /hpf   Epithelial Cells (non renal) 0-10 0  - 10 /hpf   Renal Epithel, UA 0-10 (A) None seen /hpf   Bacteria, UA Many (A) None seen/Few  T4, free     Status: None   Collection Time: 09/06/19  8:08 AM  Result Value Ref Range   Free T4 1.3 0.8 - 1.8 ng/dL  TSH     Status: Abnormal   Collection Time: 09/06/19  8:08 AM  Result Value Ref Range   TSH 5.61 (H) 0.40 - 4.50 mIU/L  Lipid panel     Status: Abnormal   Collection Time: 09/06/19  8:08 AM  Result Value Ref Range   Cholesterol 168 <200 mg/dL   HDL 66 > OR = 50 mg/dL   Triglycerides 173 (H) <150 mg/dL   LDL Cholesterol (Calc) 75 mg/dL (calc)    Comment: Reference range: <100 . Desirable range <100 mg/dL for primary prevention;   <70 mg/dL for patients with CHD or diabetic patients  with > or = 2 CHD risk factors. Marland Kitchen LDL-C is now calculated using the Martin-Hopkins  calculation, which is a validated novel method providing  better accuracy than the Friedewald equation in the  estimation of LDL-C.  Cresenciano Genre et al. Annamaria Helling. [id number];779(39): 2061-2068  (http://education.[facility].com/faq/FAQ164)    Total CHOL/HDL Ratio 2.5 <5.0 (calc)   Non-HDL Cholesterol (Calc) 102 <130 mg/dL (calc)    Comment: For patients with diabetes plus 1 major ASCVD risk  factor, treating to a non-HDL-C goal of <100 mg/dL  (LDL-C of <70 mg/dL) is considered a therapeutic  option.   COMPLETE METABOLIC PANEL WITH GFR     Status: Abnormal   Collection Time: 09/06/19  8:08 AM  Result Value Ref Range   Glucose, Bld 116 (H) 65 - 99 mg/dL    Comment: .            Fasting reference interval . For someone without known diabetes, a glucose value between 100 and 125 mg/dL is consistent  with prediabetes and should be confirmed with a follow-up test. .    BUN 15 7 - 25 mg/dL   Creat 0.94 (H) 0.60 - 0.88 mg/dL    Comment: For patients >23 years of age, the reference limit for Creatinine is approximately 13% higher for people identified as African-American. .    GFR, Est Non African American 54  (L) > OR = 60 mL/min/1.99m2   GFR, Est African American 63 > OR = 60 mL/min/1.50m2   BUN/Creatinine Ratio 16 6 - 22 (calc)   Sodium 136 135 - 146 mmol/L   Potassium 4.1 3.5 - 5.3 mmol/L   Chloride 102 98 - 110 mmol/L   CO2 26 20 - 32 mmol/L   Calcium 9.6 8.6 - 10.4 mg/dL   Total Protein 6.1 6.1 - 8.1 g/dL   Albumin 4.0 3.6 - 5.1 g/dL   Globulin 2.1 1.9 - 3.7 g/dL (calc)   AG Ratio 1.9 1.0 - 2.5 (calc)   Total Bilirubin 0.4 0.2 - 1.2 mg/dL   Alkaline phosphatase (APISO) 69 37 - 153 U/L   AST 17 10 - 35 U/L   ALT 12 6 - 29 U/L  CBC with Differential/Platelet     Status: Abnormal   Collection Time: 09/06/19  8:08 AM  Result Value Ref Range   WBC 7.1 3.8 - 10.8 Thousand/uL   RBC 3.15 (L) 3.80 - 5.10 Million/uL   Hemoglobin 9.8 (L) 11.7 - 15.5 g/dL   HCT 30.5 (L) 35 - 45 %   MCV 96.8 80.0 - 100.0 fL   MCH 31.1 27.0 - 33.0 pg   MCHC 32.1 32.0 - 36.0 g/dL   RDW 13.9 11.0 - 15.0 %   Platelets 364 140 - 400 Thousand/uL   MPV 8.8 7.5 - 12.5 fL   Neutro Abs 3,891 1,500 - 7,800 cells/uL   Lymphs Abs 2,513 850 - 3,900 cells/uL   Absolute Monocytes 525 200 - 950 cells/uL   Eosinophils Absolute 142 15 - 500 cells/uL   Basophils Absolute 28 0 - 200 cells/uL   Neutrophils Relative % 54.8 %   Total Lymphocyte 35.4 %   Monocytes Relative 7.4 %   Eosinophils Relative 2.0 %   Basophils Relative 0.4 %  Hemoglobin A1c     Status: Abnormal   Collection Time: 09/06/19  8:08 AM  Result Value Ref Range   Hgb A1c MFr Bld 6.6 (H) <5.7 % of total Hgb    Comment: For someone without known diabetes, a hemoglobin A1c value of 6.5% or greater indicates that they may have  diabetes and this should be confirmed with a follow-up  test. . For someone with known diabetes, a value <7% indicates  that their diabetes is well controlled and a value  greater than or equal to 7% indicates suboptimal  control. A1c targets should be individualized based on  duration of diabetes, age, comorbid conditions, and   other considerations. . Currently, no consensus exists regarding use of hemoglobin A1c for diagnosis of diabetes for children. .    Mean Plasma Glucose 143 (calc)   eAG (mmol/L) 7.9 (calc)  Urinalysis, Complete     Status: Abnormal   Collection Time: 10/02/19  3:09 PM  Result Value Ref Range   Specific Gravity, UA 1.015 1.005 - 1.030   pH, UA 7.0 5.0 - 7.5   Color, UA Yellow Yellow   Appearance Ur Hazy (A) Clear   Leukocytes,UA Trace (A) Negative   Protein,UA Trace (A) Negative/Trace   Glucose,  UA Negative Negative   Ketones, UA Negative Negative   RBC, UA Negative Negative   Bilirubin, UA Negative Negative   Urobilinogen, Ur 0.2 0.2 - 1.0 mg/dL   Nitrite, UA Negative Negative   Microscopic Examination See below:     Comment: Results from sub-optimal specimen performed at the client's request. Interpret result(s) with caution. Microscopic exam performed on unspun urine. Reference range(s) not established for this type of specimen.   CULTURE, URINE COMPREHENSIVE     Status: None   Collection Time: 10/02/19  3:09 PM   Specimen: Urine   UR  Result Value Ref Range   Urine Culture, Comprehensive Final report    Organism ID, Bacteria Comment     Comment: Mixed urogenital flora 10,000-25,000 colony forming units per mL   Microscopic Examination     Status: None   Collection Time: 10/02/19  3:09 PM   Urine  Result Value Ref Range   WBC, UA 0-5 0 - 5 /hpf   RBC 0-2 0 - 2 /hpf   Epithelial Cells (non renal) 0-10 0 - 10 /hpf   Bacteria, UA Few None seen/Few    -------------------------------------------------------------------------- A&P:  Problem List Items Addressed This Visit    Psychophysiological insomnia - Primary   Constipation   Anemia in chronic kidney disease (CKD)    Other Visit Diagnoses    Restless leg syndrome         #Insomnia, RLS Improved Remain off Mirtazapine, Gabapentin, Pramipexole CONTINUE Trazodone 50mg  (half or whole tab nightly PRN) gave  her verbal permission to take this more regularly now she was worried about it, it does help only tried few times. Less RLS while on med.  #Constipation - improved Followed by [facility] GI On increased bowel regimen, she will use this PRN if flares and some maintenance dosing.  #Anemia, CKDIII Encouraged her to resume Ferrous sulfate daily with meal if constipation is controlled, can adjust her bowel regimen accordingly.  No orders of the defined types were placed in this encounter.   Follow-up: - Return in 3 months for follow-up  Patient verbalizes understanding with the above medical recommendations including the limitation of remote medical advice.  Specific follow-up and call-back criteria were given for patient to follow-up or seek medical care more urgently if needed.   - Time spent in direct consultation with patient on phone: 10 minutes  Nobie Putnam, [facility] 10/16/2019, 2:03 PM

## 2019-10-16 NOTE — Patient Instructions (Addendum)
Recommend using Trazodone half or whole pill 50mg  more often nightly for sleep and RLS. Stop Mirtazapine, Gabapentin, Pramipexole  Restart Ferrous sulfate daily with meal.  Continue constipation regimen per GI.  Please schedule a Follow-up Appointment to: Return in about 3 months (around 01/15/2020) for 3 month follow-up.  If you have any other questions or concerns, please feel free to call the office or send a message through [place]. You may also schedule an earlier appointment if necessary.  Additionally, you may be receiving a survey about your experience at our office within a few days to 1 week by e-mail or mail. We value your feedback.  Nobie Putnam, DO [place]

## 2019-10-16 NOTE — Chronic Care Management (AMB) (Signed)
  Care Management   Note  10/16/2019 Name: Lisa Mills MRN: [id number] DOB: 12-Oct-1929  Lisa Mills is a 86 y.o. year old female who is a primary care patient of Olin Hauser, DO and is actively engaged with the care management team. I reached out to Raenette Rover by phone today to assist with re-scheduling a follow up visit with the Licensed Clinical Social Worker  Follow up plan: Unsuccessful telephone outreach attempt made. A HIPAA compliant phone message was left for the patient providing contact information and requesting a return call.  The care management team will reach out to the patient again over the next 7 days.  If patient returns call to provider office, please advise to call [facility]  at [place], [place], [place], [place] [address] Direct Dial: [phone] [email or web address] Website: [email or web address]

## 2019-10-23 ENCOUNTER — Encounter: Payer: PPO | Admitting: Dermatology

## 2019-10-24 ENCOUNTER — Other Ambulatory Visit: Payer: Self-pay

## 2019-10-24 ENCOUNTER — Ambulatory Visit
Admission: RE | Admit: 2019-10-24 | Discharge: 2019-10-24 | Disposition: A | Discharge status: Home / Self Care | Payer: PPO | Source: Ambulatory Visit | Attending: Gastroenterology | Admitting: Gastroenterology

## 2019-10-24 DIAGNOSIS — K5900 Constipation, unspecified: Secondary | ICD-10-CM | POA: Diagnosis not present

## 2019-10-24 DIAGNOSIS — N200 Calculus of kidney: Secondary | ICD-10-CM | POA: Diagnosis not present

## 2019-10-24 DIAGNOSIS — R1084 Generalized abdominal pain: Secondary | ICD-10-CM | POA: Insufficient documentation

## 2019-10-24 DIAGNOSIS — R634 Abnormal weight loss: Secondary | ICD-10-CM | POA: Insufficient documentation

## 2019-10-24 DIAGNOSIS — K449 Diaphragmatic hernia without obstruction or gangrene: Secondary | ICD-10-CM | POA: Diagnosis not present

## 2019-10-24 DIAGNOSIS — N281 Cyst of kidney, acquired: Secondary | ICD-10-CM | POA: Diagnosis not present

## 2019-10-24 NOTE — Chronic Care Management (AMB) (Signed)
  Care Management   Note  10/24/2019 Name: Lisa Mills MRN: [id number] DOB: November 15, 1929  Lisa Mills is a 86 y.o. year old female who is a primary care patient of Olin Hauser, DO and is actively engaged with the care management team. I reached out to Raenette Rover by phone today to assist with re-scheduling a follow up visit with the Licensed Clinical Social Worker  Follow up plan: Unsuccessful telephone outreach attempt made. A HIPAA compliant phone message was left for the patient providing contact information and requesting a return call.  The care management team will reach out to the patient again over the next 7 days.  If patient returns call to provider office, please advise to call [facility]  at [place], [address], [place], [place] [address] Direct Dial: [phone] [email or web address] Website: [email or web address]

## 2019-10-26 NOTE — Chronic Care Management (AMB) (Signed)
  Care Management   Note  10/26/2019 Name: LYRIKA SOUDERS MRN: [id number] DOB: [birth year]  FRANCILE WOOLFORD is a 86 y.o. year old female who is a primary care patient of Olin Hauser, DO and is actively engaged with the care management team. I reached out to Raenette Rover by phone today to assist with re-scheduling an initial visit with the Licensed Clinical Social Worker  Follow up plan: Telephone appointment with care management team member scheduled for:12/05/2019  Noreene Larsson, [place], [place] [facility]  [place], [place] [address] Direct Dial: [phone] [email or web address] Website: [email or web address]

## 2019-10-26 NOTE — Telephone Encounter (Signed)
Pt has been r/s  

## 2019-11-06 ENCOUNTER — Telehealth: Payer: Self-pay

## 2019-11-06 NOTE — Telephone Encounter (Signed)
3:50pm. Palliative care SW outreached patient to schedule in home visit. Visit scheduled for Thu 10/21 @11am .

## 2019-11-08 ENCOUNTER — Ambulatory Visit: Payer: PPO | Admitting: Pharmacist

## 2019-11-08 DIAGNOSIS — F5104 Psychophysiologic insomnia: Secondary | ICD-10-CM

## 2019-11-08 DIAGNOSIS — D631 Anemia in chronic kidney disease: Secondary | ICD-10-CM

## 2019-11-08 NOTE — Patient Instructions (Signed)
Thank you allowing the Chronic Care Management Team to be a part of your care! It was a pleasure speaking with you today!     CCM (Chronic Care Management) Team    Lisa Larsson RN, MSN, CCM Nurse Care Coordinator  [phone]   Lisa Mills PharmD  Clinical Pharmacist  [phone]   Lisa Fried LCSW Clinical Social Worker [phone]  Visit Information  Goals Addressed            This Visit's Progress   . PharmD - Medication Managment       CARE PLAN ENTRY (see longitudinal plan of care for additional care plan information)   Current Barriers:  . Chronic Disease Management support, education, and care coordination needs related to T2DM, CKD, HLD, hypothyroidism, anxiety and constipation  Pharmacist Clinical Goal(s):  Marland Kitchen Over the next 30 days, patient will work with CM Pharmacist to complete medication review and address needs identified  Interventions: . Perform chart review . Today [name] reports that she is doing well and is pleased that she is gaining some weight back . Follow up with patient regarding constipation/iron supplement o Reports that she has resumed taking the ferrous sulfate once daily with breakfast and that she is doing okay with tolerating this dose o Reports continuing bowel regimen as directed by [name]. Note patient has follow up appointment with GI on 10/26 . Follow up regarding insomina o Reports feels like her insomina is improved with her being more active during the day o Reports has trazodone to use as directed by PCP, but denies using recently as prefers not to take . Provider and Inter-disciplinary care team collaboration (see longitudinal plan of care) . Patient denies further medication question/concerns at this time  Patient Self Care Activities:  . Attends all scheduled provider appointments o Next appointment with [name] on 10/26 o Next appointment with Urology on 11/8  Please  see past updates related to this goal by clicking on the "Past Updates" button in the selected goal         Patient verbalizes understanding of instructions provided today.   1) Patient denies further medication concerns or need for further follow up from CM Pharmacist at this time   Lisa Mills, PharmD, [place] [phone]

## 2019-11-08 NOTE — Chronic Care Management (AMB) (Signed)
Chronic Care Management   Follow Up Note   11/08/2019 Name: Lisa Mills MRN: [id number] DOB: [birth year]  Referred by: Olin Hauser, DO Reason for referral : Chronic Care Management (Patient Phone Call)   Lisa Mills is a 86 y.o. year old female who is a primary care patient of Olin Hauser, DO. The CCM team was consulted for assistance with chronic disease management and care coordination needs.    I reached out to Raenette Rover by phone today.   Review of patient status, including review of consultants reports, relevant laboratory and other test results, and collaboration with appropriate care team members and the patient's provider was performed as part of comprehensive patient evaluation and provision of chronic care management services.    SDOH (Social Determinants of Health) assessments performed: No See Care Plan activities for detailed interventions related to [facility])     Outpatient Encounter Medications as of 11/08/2019  Medication Sig  . ferrous sulfate 325 (65 FE) MG tablet Take 325 mg by mouth daily.   . polyethylene glycol (MIRALAX / GLYCOLAX) 17 g packet Take 17 g by mouth daily as needed for moderate constipation or severe constipation.  . Blood Glucose Monitoring Suppl ([place]) w/Device KIT Use glucometer to check blood sugar daily.  . Camphor-Menthol-Methyl Sal (SALONPAS) 3.01-25-08 % PTCH Apply 1 patch topically 2 (two) times daily.  . cholecalciferol (VITAMIN D) 1000 units tablet Take 2,000 Units by mouth daily.   Marland Kitchen escitalopram (LEXAPRO) 5 MG tablet TAKE 1 TABLET BY MOUTH ONCE DAILY WITH FOOD (Patient taking differently: Take 5 mg by mouth daily. )  . feeding supplement, ENSURE ENLIVE, (ENSURE ENLIVE) LIQD Take 237 mLs by mouth 3 (three) times daily.  Marland Kitchen glucose blood (ONE TOUCH ULTRA TEST) test strip CHECK BLOOD SUGAR UP TO 2 TIMES A DAY.  . hydrocortisone (ANUSOL-HC) 2.5 % rectal cream USE 1 APPLICATION RECTALLY TWICE  DAILY AS DIRECTED  . hydrocortisone (ANUSOL-HC) 25 MG suppository Place 1 suppository (25 mg total) rectally 2 (two) times daily. For 7 days for hemorrhoid  . hydrocortisone 2.5 % cream Place rectally.   Marland Kitchen levothyroxine (SYNTHROID) 25 MCG tablet TAKE 1 TABLET BY MOUTH ONCE DAILY ON AN EMPTY STOMACH. WAIT 30 MINUTES BEFORE TAKING OTHER MEDS. (Patient taking differently: Take 25 mcg by mouth daily before breakfast. TAKE 1 TABLET BY MOUTH ONCE DAILY ON AN EMPTY STOMACH. WAIT 30 MINUTES BEFORE TAKING OTHER MEDS.)  . lisinopril (ZESTRIL) 5 MG tablet Take 5 mg by mouth daily.  . nitrofurantoin (MACRODANTIN) 100 MG capsule Take 1 capsule (100 mg total) by mouth daily.  Marland Kitchen omeprazole (PRILOSEC) 40 MG capsule Take 1 tablet 30 mins before breakfast and 1 tablet 30 mins before dinner. Take on an empty stomach, without other medications.  Glory [place] DELICA LANCETS 13K MISC 1 each by Does not apply route 3 (three) times daily. Dx:E11.9  . senna (SENOKOT) 8.6 MG TABS tablet Take 1 tablet (8.6 mg total) by mouth 2 (two) times daily.  . simvastatin (ZOCOR) 40 MG tablet TAKE 1 TABLET BY MOUTH AT BEDTIME (Patient taking differently: Take 40 mg by mouth at bedtime. )  . traZODone (DESYREL) 50 MG tablet Take 0.5-1 tablets (25-50 mg total) by mouth at bedtime as needed for sleep. (Patient not taking: Reported on 11/08/2019)   No facility-administered encounter medications on file as of 11/08/2019.    Goals Addressed            This Visit's Progress   .  PharmD - Medication Managment       CARE PLAN ENTRY (see longitudinal plan of care for additional care plan information)   Current Barriers:  . Chronic Disease Management support, education, and care coordination needs related to T2DM, CKD, HLD, hypothyroidism, anxiety and constipation  Pharmacist Clinical Goal(s):  Marland Kitchen Over the next 30 days, patient will work with CM Pharmacist to complete medication review and address needs  identified  Interventions: . Perform chart review . Today Ms. Mckowen reports that she is doing well and is pleased that she is gaining some weight back . Follow up with patient regarding constipation/iron supplement o Reports that she has resumed taking the ferrous sulfate once daily with breakfast and that she is doing okay with tolerating this dose o Reports continuing bowel regimen as directed by [facility]. Note patient has follow up appointment with GI on 10/26 . Follow up regarding insomina o Reports feels like her insomina is improved with her being more active during the day o Reports has trazodone to use as directed by PCP, but denies using recently as prefers not to take . Provider and Inter-disciplinary care team collaboration (see longitudinal plan of care) . Patient denies further medication question/concerns at this time  Patient Self Care Activities:  . Attends all scheduled provider appointments o Next appointment with [facility] on 10/26 o Next appointment with Urology on 11/8  Please see past updates related to this goal by clicking on the "Past Updates" button in the selected goal         Plan  1) Patient denies further medication concerns or need for further follow up from CM Pharmacist at this time 2) The patient has been provided with contact information for the care management team and has been advised to call with any health related questions or concerns.   Harlow Asa, PharmD, [place] [employer] [phone]

## 2019-11-09 ENCOUNTER — Other Ambulatory Visit: Payer: PPO

## 2019-11-09 ENCOUNTER — Other Ambulatory Visit: Payer: Self-pay

## 2019-11-09 DIAGNOSIS — Z515 Encounter for palliative care: Secondary | ICD-10-CM

## 2019-11-09 NOTE — Progress Notes (Signed)
COMMUNITY PALLIATIVE CARE SW NOTE  PATIENT NAME: Lisa Mills DOB: 08/08/1929 MRN: [id number]  PRIMARY CARE PROVIDER: Olin Hauser, DO  RESPONSIBLE PARTY:  Acct ID - Guarantor Home Phone Work Phone Relationship Acct Type  [id number] Lisa Mills, Lisa [phone]  Self P/F     [place], [place] [address]     PLAN OF CARE and INTERVENTIONS:             1. Patient is a DNR. Son is HCPOA. Patient has living will. Patients goal is to become independent again and gain strength in legs.  2. SOCIAL/EMOTIONAL/SPIRITUAL ASSESSMENT/ INTERVENTIONS:  SW met with patient in patients home for monthly in home visit. Patient on medical conditions. Patient shared she her urine incontinence has improved and she is no longer having constipation issues due to taking laxatives but now she has bowel movements and does not know shes having them. Patient has GI appt Tues 10/29. Patient stated that does not have pain unless she is bending over to pick something up off the floor. Patient stated that she does not take pain medicine besides Tylenol. Patient continues to live alone but has support from her son who visits daily and stays with her on the weekend and her daughter who lives in [place] but has been visiting and staying with patient more often now. Patient stated that she is still not interested in having another caregiver at this time, and that she will let SW know when she is ready to look into this. Patient currently driving herself to appointments when son is unavailable. Patient states that she is fine alone for now with her children checking on her daily. Patient shared that she will be looking into life alert system offered through the hospital. Patient shared that her appetite has improved and has gained nearly 10lbs. Patient is sleeping well. SW, discussed goals, reviewed care plan, provided emotional support, used active and reflective listening. Will continue to follow for symptom management  and grief support.    3. PATIENT/CAREGIVER EDUCATION/ COPING:  Patient A&O x3 and engaged in discussion with SW. Patient denied any feelings of anxiousness or depression. Patient continues going through grieving process. Family is supportive and visits and stays with patient often/daily. 4. PERSONAL EMERGENCY PLAN:  Patient to call 911 for emergencies. 5. COMMUNITY RESOURCES COORDINATION/ HEALTH CARE NAVIGATION:  Patient manages her own care with the assistance of her son.  6. FINANCIAL/LEGAL CONCERNS/INTERVENTIONS:  None. Patient on fixed income.     SOCIAL HX:  Social History   Tobacco Use  . Smoking status: Never Smoker  . Smokeless tobacco: Never Used  Substance Use Topics  . Alcohol use: Not Currently    CODE STATUS: DNR ADVANCED DIRECTIVES: Y MOST FORM COMPLETE: N HOSPICE EDUCATION PROVIDED: N  PPS: Patient is independent with ADL's and meal prep. Patient has RW and SPC.  Time spent: 30 min      [place], [place]

## 2019-11-14 DIAGNOSIS — K219 Gastro-esophageal reflux disease without esophagitis: Secondary | ICD-10-CM | POA: Diagnosis not present

## 2019-11-14 DIAGNOSIS — M6289 Other specified disorders of muscle: Secondary | ICD-10-CM | POA: Diagnosis not present

## 2019-11-14 DIAGNOSIS — K5909 Other constipation: Secondary | ICD-10-CM | POA: Diagnosis not present

## 2019-11-23 ENCOUNTER — Telehealth: Payer: PPO

## 2019-11-23 ENCOUNTER — Telehealth: Payer: Self-pay | Admitting: General Practice

## 2019-11-23 NOTE — Telephone Encounter (Signed)
°  Chronic Care Management   Outreach Note  11/23/2019 Name: Lisa Mills MRN: [id number] DOB: [birth year]  Referred by: Olin Hauser, DO Reason for referral : Chronic Care Management (RNCM Follow up for Chronic Disease Managment and Care Coordination Needs)   An unsuccessful telephone outreach was attempted today. The patient was referred to the case management team for assistance with care management and care coordination.   Follow Up Plan: A HIPAA compliant phone message was left for the patient providing contact information and requesting a return call.   Noreene Larsson RN, MSN, [place] Mobile: [phone]

## 2019-11-27 ENCOUNTER — Other Ambulatory Visit: Payer: Self-pay

## 2019-11-27 ENCOUNTER — Ambulatory Visit (INDEPENDENT_AMBULATORY_CARE_PROVIDER_SITE_OTHER): Payer: PPO | Admitting: Urology

## 2019-11-27 VITALS — BP 119/72 | HR 69 | Wt 109.8 lb

## 2019-11-27 DIAGNOSIS — N3020 Other chronic cystitis without hematuria: Secondary | ICD-10-CM

## 2019-11-27 MED ORDER — NITROFURANTOIN MACROCRYSTAL 100 MG PO CAPS: 100.0000 mg | ORAL_CAPSULE | Freq: Every day | ORAL | 11 refills | Status: DC

## 2019-11-27 NOTE — Progress Notes (Signed)
11/27/2019 2:15 PM   Lisa Mills 01-02-1930 [id number]  Referring provider: Olin Hauser, DO [address] [place],  [place] [address]  Chief Complaint  Patient presents with  . Urinary Incontinence    HPI: Patient was recently discharged from the hospital with pelvic fracture.She fractured the sacrum bilaterally. She had surgery. She was having a lot of sacral pain. She had a Foley catheter. She said she could only void standing and was incontinent of urine and feces. She had leg weakness. She was seen by neurosurgery and they did not think she had cauda equina syndrome.  I was consulted to assess the patient's urinary incontinence. She has high-volume voiding when she goes from a sitting to standing position that she does not feel. She is not feeling urgency. She has stress incontinence and moderately severe bedwetting. She wears 6 pads a day sometimes damp and sometimes soaked.  8 months ago she described foot on the floor syndrome but she says her incontinence is now much worse since the fractured tailbone and pelvis. She is now mobile with a walker and things are little bit better than they were a few weeks ago.  She is now going twice a night instead of 4 times. She is voiding every 2 hours. She recently in rehab center was treated for a bladder infection but does not normally get them.  Patient having a tremendous time having bowel movements. Apparently being treated and has not had a good bowel movement for a week. She may have decreased sensation in the perineum area but was nonspecific  Patient clinically had urge incontinence and likely now has a functional component but has a high risk factor for neurogenic bladder as well. Hopefully things can also settle with more mobility.   I would like to see her in about 6 or 7 weeks once her functional status has improved and see if her incontinence is settled down. If suppression therapy seems to be  working I may or may not continue it. I do not want to treat her overactive bladder yet or order urodynamics yet. Too early since her injury.Patient was primary care for bowel dysfunction doctor. She understands she could have an element of a neurogenic bladder and time can help with this as well.  I believe the patient did have a breakthrough infection with burning and foul-smelling urine effectively treated with Cipro by primary care a few weeks ago.  Having said that I will keep her on the daily Macrodantin.  If she gets a second breakthrough I will switch it  She says her incontinence is much better.  High-volume flooding episodes upon standing have greatly decreased.  Still has some foot on the floor syndrome little night.  She is now walking with or without a walker.  Still has constipation then can have issues with some fecal incontinence  Reassess in 8 weeks on daily Macrodantin.  Switch prophylaxis if she gets a second breakthrough.  I will not start an overactive bladder medication yet and see how much she improves.  She agreed with the treatment plan.  Call if today's urine culture positive  Today Frequency stable.  Last culture negative  Infection free on daily Macrodantin.  Almost no incontinence.  1 rare episode if she holds it too long with foot on the floor syndrome.  Clinically not infected today    PMH: Past Medical History:  Diagnosis Date  . Dermatophytosis of nail   . GERD (gastroesophageal reflux disease)   .  History of shingles   . Hypertension   . Keratoderma     Surgical History: Past Surgical History:  Procedure Laterality Date  . BREAST BIOPSY Right 09/30/2017   Affirm bx-calcs ( X clip),benign  . BREAST EXCISIONAL BIOPSY Right    neg  . cataracts    . COLONOSCOPY W/ POLYPECTOMY    . COLONOSCOPY WITH PROPOFOL N/A 02/10/2017   Procedure: COLONOSCOPY WITH PROPOFOL;  Surgeon: Manya Silvas, MD;  Location: [facility] ENDOSCOPY;  Service: Endoscopy;   Laterality: N/A;  . ESOPHAGOGASTRODUODENOSCOPY (EGD) WITH PROPOFOL N/A 02/10/2017   Procedure: ESOPHAGOGASTRODUODENOSCOPY (EGD) WITH PROPOFOL;  Surgeon: Manya Silvas, MD;  Location: [facility] ENDOSCOPY;  Service: Endoscopy;  Laterality: N/A;  . EYE SURGERY     bilateral cataracts  . HAND SURGERY    . KIDNEY STONE SURGERY    . SACROPLASTY N/A 07/06/2019   Procedure: SACROPLASTY;  Surgeon: Hessie Knows, MD;  Location: [facility] ORS;  Service: Orthopedics;  Laterality: N/A;  . TONSILECTOMY, ADENOIDECTOMY, BILATERAL MYRINGOTOMY AND TUBES    . TONSILLECTOMY    . TRIGGER FINGER RELEASE     Left ring finger  . tubercular peritonitis      Home Medications:  Allergies as of 11/27/2019      Reactions   Aspirin Other (See Comments)   Burns stomach   Conray [iothalamate] Hives   IV dye conray-400   Dye Fdc Red [red Dye] Hives   Sulfa Antibiotics    Unknown Reaction, not used in years   Prednisone    Indigestion       Medication List       Accurate as of November 27, 2019  2:15 PM. If you have any questions, ask your nurse or doctor.        cholecalciferol 1000 units tablet Commonly known as: VITAMIN D Take 2,000 Units by mouth daily.   escitalopram 5 MG tablet Commonly known as: LEXAPRO TAKE 1 TABLET BY MOUTH ONCE DAILY WITH FOOD What changed: See the new instructions.   feeding supplement Liqd Take 237 mLs by mouth 3 (three) times daily.   ferrous sulfate 325 (65 FE) MG tablet Take 325 mg by mouth daily.   glucose blood test strip Commonly known as: ONE TOUCH ULTRA TEST CHECK BLOOD SUGAR UP TO 2 TIMES A DAY.   hydrocortisone 2.5 % cream Place rectally.   hydrocortisone 2.5 % rectal cream Commonly known as: ANUSOL-HC USE 1 APPLICATION RECTALLY TWICE DAILY AS DIRECTED   hydrocortisone 25 MG suppository Commonly known as: ANUSOL-HC Place 1 suppository (25 mg total) rectally 2 (two) times daily. For 7 days for hemorrhoid   levothyroxine 25 MCG tablet Commonly known as:  SYNTHROID TAKE 1 TABLET BY MOUTH ONCE DAILY ON AN EMPTY STOMACH. WAIT 30 MINUTES BEFORE TAKING OTHER MEDS. What changed: See the new instructions.   lisinopril 5 MG tablet Commonly known as: ZESTRIL Take 5 mg by mouth daily.   nitrofurantoin 100 MG capsule Commonly known as: Macrodantin Take 1 capsule (100 mg total) by mouth daily.   omeprazole 40 MG capsule Commonly known as: PRILOSEC Take 1 tablet 30 mins before breakfast and 1 tablet 30 mins before dinner. Take on an empty stomach, without other medications.   [place]udson w/Device Kit Use glucometer to check blood sugar daily.   OneTouch Delica Lancets 16X Misc 1 each by Does not apply route 3 (three) times daily. Dx:E11.9   polyethylene glycol 17 g packet Commonly known as: MIRALAX / GLYCOLAX Take 17 g  by mouth daily as needed for moderate constipation or severe constipation.   Salonpas 3.01-25-08 % Ptch Generic drug: Camphor-Menthol-Methyl Sal Apply 1 patch topically 2 (two) times daily.   senna 8.6 MG Tabs tablet Commonly known as: SENOKOT Take 1 tablet (8.6 mg total) by mouth 2 (two) times daily.   simvastatin 40 MG tablet Commonly known as: ZOCOR TAKE 1 TABLET BY MOUTH AT BEDTIME   traZODone 50 MG tablet Commonly known as: DESYREL Take 0.5-1 tablets (25-50 mg total) by mouth at bedtime as needed for sleep.       Allergies:  Allergies  Allergen Reactions  . Aspirin Other (See Comments)    Burns stomach  . Conray [Iothalamate] Hives    IV dye conray-400  . Dye Fdc Red [Red Dye] Hives  . Sulfa Antibiotics     Unknown Reaction, not used in years  . Prednisone     Indigestion     Family History: Family History  Adopted: Yes  Problem Relation Age of Onset  . Breast cancer Neg Hx     Social History:  reports that she has never smoked. She has never used smokeless tobacco. She reports previous alcohol use. She reports that she does not use drugs.  ROS:                                          Physical Exam: BP 119/72   Pulse 69   Wt 109 lb 12.8 oz (49.8 kg)   BMI 22.18 kg/m   Constitutional:  Alert and oriented, No acute distress.  Laboratory Data: Lab Results  Component Value Date   WBC 7.1 09/06/2019   HGB 9.8 (L) 09/06/2019   HCT 30.5 (L) 09/06/2019   MCV 96.8 09/06/2019   PLT 364 09/06/2019    Lab Results  Component Value Date   CREATININE 0.94 (H) 09/06/2019    No results found for: PSA  No results found for: TESTOSTERONE  Lab Results  Component Value Date   HGBA1C 6.6 (H) 09/06/2019    Urinalysis    Component Value Date/Time   COLORURINE STRAW (A) 07/17/2019 1143   APPEARANCEUR Hazy (A) 10/02/2019 1509   LABSPEC 1.005 07/17/2019 1143   PHURINE 8.0 07/17/2019 1143   GLUCOSEU Negative 10/02/2019 1509   HGBUR NEGATIVE 07/17/2019 1143   BILIRUBINUR Negative 10/02/2019 1509   KETONESUR 5 (A) 07/17/2019 1143   PROTEINUR Trace (A) 10/02/2019 1509   PROTEINUR NEGATIVE 07/17/2019 1143   UROBILINOGEN Normal 03/29/2017 0000   NITRITE Negative 10/02/2019 1509   NITRITE NEGATIVE 07/17/2019 1143   LEUKOCYTESUR Trace (A) 10/02/2019 1509   LEUKOCYTESUR NEGATIVE 07/17/2019 1143    Pertinent Imaging:   Assessment & Plan: Reassess 1 year prescription renewed 90x3  There are no diagnoses linked to this encounter.  No follow-ups on file.  Reece Packer, MD  [facility] [address], [place], [place] [address] [phone]

## 2019-11-28 ENCOUNTER — Ambulatory Visit (INDEPENDENT_AMBULATORY_CARE_PROVIDER_SITE_OTHER): Payer: PPO

## 2019-11-28 VITALS — Ht 59.5 in | Wt 109.0 lb

## 2019-11-28 DIAGNOSIS — Z0000 Encounter for general adult medical examination without abnormal findings: Secondary | ICD-10-CM | POA: Diagnosis not present

## 2019-11-28 NOTE — Patient Instructions (Signed)
Lisa Mills , Thank you for taking time to come for your Medicare Wellness Visit. I appreciate your ongoing commitment to your health goals. Please review the following plan we discussed and let me know if I can assist you in the future.   Screening recommendations/referrals: Colonoscopy: not required Mammogram: not required Bone Density: completed 09/02/2016, due 09/02/2021 Recommended yearly ophthalmology/optometry visit for glaucoma screening and checkup Recommended yearly dental visit for hygiene and checkup  Vaccinations: Influenza vaccine: will get in December Pneumococcal vaccine: completed 08/25/2016 Tdap vaccine: completed 09/26/2010 Shingles vaccine: completed   Covid-19: 04/19/2019, 03/26/2019  Advanced directives: Please bring a copy of your POA (Power of Attorney) and/or Living Will to your next appointment.    Conditions/risks identified: none  Next appointment: Follow up in one year for your annual wellness visit    Preventive Care 65 Years and Older, Female Preventive care refers to lifestyle choices and visits with your health care provider that can promote health and wellness. What does preventive care include?  A yearly physical exam. This is also called an annual well check.  Dental exams once or twice a year.  Routine eye exams. Ask your health care provider how often you should have your eyes checked.  Personal lifestyle choices, including:  Daily care of your teeth and gums.  Regular physical activity.  Eating a healthy diet.  Avoiding tobacco and drug use.  Limiting alcohol use.  Practicing safe sex.  Taking low-dose aspirin every day.  Taking vitamin and mineral supplements as recommended by your health care provider. What happens during an annual well check? The services and screenings done by your health care provider during your annual well check will depend on your age, overall health, lifestyle risk factors, and family history of  disease. Counseling  Your health care provider may ask you questions about your:  Alcohol use.  Tobacco use.  Drug use.  Emotional well-being.  Home and relationship well-being.  Sexual activity.  Eating habits.  History of falls.  Memory and ability to understand (cognition).  Work and work Statistician.  Reproductive health. Screening  You may have the following tests or measurements:  Height, weight, and BMI.  Blood pressure.  Lipid and cholesterol levels. These may be checked every 5 years, or more frequently if you are over 54 years old.  Skin check.  Lung cancer screening. You may have this screening every year starting at age 84 if you have a 30-pack-year history of smoking and currently smoke or have quit within the past 15 years.  Fecal occult blood test (FOBT) of the stool. You may have this test every year starting at age 84.  Flexible sigmoidoscopy or colonoscopy. You may have a sigmoidoscopy every 5 years or a colonoscopy every 10 years starting at age 84.  Hepatitis C blood test.  Hepatitis B blood test.  Sexually transmitted disease (STD) testing.  Diabetes screening. This is done by checking your blood sugar (glucose) after you have not eaten for a while (fasting). You may have this done every 1-3 years.  Bone density scan. This is done to screen for osteoporosis. You may have this done starting at age 84.  Mammogram. This may be done every 1-2 years. Talk to your health care provider about how often you should have regular mammograms. Talk with your health care provider about your test results, treatment options, and if necessary, the need for more tests. Vaccines  Your health care provider may recommend certain vaccines, such as:  Influenza  vaccine. This is recommended every year.  Tetanus, diphtheria, and acellular pertussis (Tdap, Td) vaccine. You may need a Td booster every 10 years.  Zoster vaccine. You may need this after age  84.  Pneumococcal 13-valent conjugate (PCV13) vaccine. One dose is recommended after age 84.  Pneumococcal polysaccharide (PPSV23) vaccine. One dose is recommended after age 84. Talk to your health care provider about which screenings and vaccines you need and how often you need them. This information is not intended to replace advice given to you by your health care provider. Make sure you discuss any questions you have with your health care provider. Document Released: 02/01/2015 Document Revised: 09/25/2015 Document Reviewed: 11/06/2014 Elsevier Interactive Patient Education  2017 [employer] Prevention in the Home Falls can cause injuries. They can happen to people of all ages. There are many things you can do to make your home safe and to help prevent falls. What can I do on the outside of my home?  Regularly fix the edges of walkways and driveways and fix any cracks.  Remove anything that might make you trip as you walk through a door, such as a raised step or threshold.  Trim any bushes or trees on the path to your home.  Use bright outdoor lighting.  Clear any walking paths of anything that might make someone trip, such as rocks or tools.  Regularly check to see if handrails are loose or broken. Make sure that both sides of any steps have handrails.  Any raised decks and porches should have guardrails on the edges.  Have any leaves, snow, or ice cleared regularly.  Use sand or salt on walking paths during winter.  Clean up any spills in your garage right away. This includes oil or grease spills. What can I do in the bathroom?  Use night lights.  Install grab bars by the toilet and in the tub and shower. Do not use towel bars as grab bars.  Use non-skid mats or decals in the tub or shower.  If you need to sit down in the shower, use a plastic, non-slip stool.  Keep the floor dry. Clean up any water that spills on the floor as soon as it happens.  Remove  soap buildup in the tub or shower regularly.  Attach bath mats securely with double-sided non-slip rug tape.  Do not have throw rugs and other things on the floor that can make you trip. What can I do in the bedroom?  Use night lights.  Make sure that you have a light by your bed that is easy to reach.  Do not use any sheets or blankets that are too big for your bed. They should not hang down onto the floor.  Have a firm chair that has side arms. You can use this for support while you get dressed.  Do not have throw rugs and other things on the floor that can make you trip. What can I do in the kitchen?  Clean up any spills right away.  Avoid walking on wet floors.  Keep items that you use a lot in easy-to-reach places.  If you need to reach something above you, use a strong step stool that has a grab bar.  Keep electrical cords out of the way.  Do not use floor polish or wax that makes floors slippery. If you must use wax, use non-skid floor wax.  Do not have throw rugs and other things on the floor that can  make you trip. What can I do with my stairs?  Do not leave any items on the stairs.  Make sure that there are handrails on both sides of the stairs and use them. Fix handrails that are broken or loose. Make sure that handrails are as long as the stairways.  Check any carpeting to make sure that it is firmly attached to the stairs. Fix any carpet that is loose or worn.  Avoid having throw rugs at the top or bottom of the stairs. If you do have throw rugs, attach them to the floor with carpet tape.  Make sure that you have a light switch at the top of the stairs and the bottom of the stairs. If you do not have them, ask someone to add them for you. What else can I do to help prevent falls?  Wear shoes that:  Do not have high heels.  Have rubber bottoms.  Are comfortable and fit you well.  Are closed at the toe. Do not wear sandals.  If you use a  stepladder:  Make sure that it is fully opened. Do not climb a closed stepladder.  Make sure that both sides of the stepladder are locked into place.  Ask someone to hold it for you, if possible.  Clearly mark and make sure that you can see:  Any grab bars or handrails.  First and last steps.  Where the edge of each step is.  Use tools that help you move around (mobility aids) if they are needed. These include:  Canes.  Walkers.  Scooters.  Crutches.  Turn on the lights when you go into a dark area. Replace any light bulbs as soon as they burn out.  Set up your furniture so you have a clear path. Avoid moving your furniture around.  If any of your floors are uneven, fix them.  If there are any pets around you, be aware of where they are.  Review your medicines with your doctor. Some medicines can make you feel dizzy. This can increase your chance of falling. Ask your doctor what other things that you can do to help prevent falls. This information is not intended to replace advice given to you by your health care provider. Make sure you discuss any questions you have with your health care provider. Document Released: 11/01/2008 Document Revised: 06/13/2015 Document Reviewed: 02/09/2014 Elsevier Interactive Patient Education  2017 [employer].

## 2019-11-28 NOTE — Progress Notes (Signed)
I connected with Vanetta Rule today by telephone and verified that I am speaking with the correct person using two identifiers. Location patient: home Location provider: work Persons participating in the virtual visit: Aerith, Canal LPN.   I discussed the limitations, risks, security and privacy concerns of performing an evaluation and management service by telephone and the availability of in person appointments. I also discussed with the patient that there may be a patient responsible charge related to this service. The patient expressed understanding and verbally consented to this telephonic visit.    Interactive audio and video telecommunications were attempted between this provider and patient, however failed, due to patient having technical difficulties OR patient did not have access to video capability.  We continued and completed visit with audio only.     Vital signs may be patient reported or missing.  Subjective:   Lisa Mills is a 86 y.o. female who presents for Medicare Annual (Subsequent) preventive examination.  Review of Systems     Cardiac Risk Factors include: advanced age (>26mn, >>3women);diabetes mellitus;sedentary lifestyle     Objective:    Today's Vitals   11/28/19 1541  Weight: 109 lb (49.4 kg)  Height: 4' 11.5" (1.511 m)   Body mass index is 21.65 kg/m.  Advanced Directives 11/28/2019 09/07/2019 07/17/2019 07/17/2019 07/09/2019 07/04/2019 10/19/2017  Does Patient Have a Medical Advance Directive? Yes No Yes No No No Yes  Type of [place]aramed[place]of [place] will - [place] will - - - [place] will  Does patient want to make changes to medical advance directive? - - No - Patient declined - - - -  Copy of [place] Chart? No - copy requested - - - - - Yes  Would patient like information on creating a medical advance directive? - No - Patient  declined No - Patient declined - No - Patient declined No - Patient declined -    Current Medications (verified) Outpatient Encounter Medications as of 11/28/2019  Medication Sig  . Blood Glucose Monitoring Suppl (ODeputy w/Device KIT Use glucometer to check blood sugar daily.  . cholecalciferol (VITAMIN D) 1000 units tablet Take 2,000 Units by mouth daily.   .Marland Kitchenescitalopram (LEXAPRO) 5 MG tablet TAKE 1 TABLET BY MOUTH ONCE DAILY WITH FOOD (Patient taking differently: Take 5 mg by mouth daily. )  . feeding supplement, ENSURE ENLIVE, (ENSURE ENLIVE) LIQD Take 237 mLs by mouth 3 (three) times daily.  . ferrous sulfate 325 (65 FE) MG tablet Take 325 mg by mouth daily.   .Marland Kitchenglucose blood (ONE TOUCH ULTRA TEST) test strip CHECK BLOOD SUGAR UP TO 2 TIMES A DAY.  . hydrocortisone (ANUSOL-HC) 2.5 % rectal cream USE 1 APPLICATION RECTALLY TWICE DAILY AS DIRECTED  . hydrocortisone (ANUSOL-HC) 25 MG suppository Place 1 suppository (25 mg total) rectally 2 (two) times daily. For 7 days for hemorrhoid  . hydrocortisone 2.5 % cream Place rectally.   .Marland Kitchenlevothyroxine (SYNTHROID) 25 MCG tablet TAKE 1 TABLET BY MOUTH ONCE DAILY ON AN EMPTY STOMACH. WAIT 30 MINUTES BEFORE TAKING OTHER MEDS. (Patient taking differently: Take 25 mcg by mouth daily before breakfast. TAKE 1 TABLET BY MOUTH ONCE DAILY ON AN EMPTY STOMACH. WAIT 30 MINUTES BEFORE TAKING OTHER MEDS.)  . lisinopril (ZESTRIL) 5 MG tablet Take 5 mg by mouth daily.  . nitrofurantoin (MACRODANTIN) 100 MG capsule Take 1 capsule (100 mg total) by mouth daily.  .Marland Kitchen  omeprazole (PRILOSEC) 40 MG capsule Take 1 tablet 30 mins before breakfast and 1 tablet 30 mins before dinner. Take on an empty stomach, without other medications.  Glory Rosebush DELICA LANCETS 94B MISC 1 each by Does not apply route 3 (three) times daily. Dx:E11.9  . polyethylene glycol (MIRALAX / GLYCOLAX) 17 g packet Take 17 g by mouth daily as needed for moderate constipation or severe  constipation.  . senna (SENOKOT) 8.6 MG TABS tablet Take 1 tablet (8.6 mg total) by mouth 2 (two) times daily.  . simvastatin (ZOCOR) 40 MG tablet TAKE 1 TABLET BY MOUTH AT BEDTIME (Patient taking differently: Take 40 mg by mouth at bedtime. )  . traZODone (DESYREL) 50 MG tablet Take 0.5-1 tablets (25-50 mg total) by mouth at bedtime as needed for sleep.  . Camphor-Menthol-Methyl Sal (SALONPAS) 3.01-25-08 % PTCH Apply 1 patch topically 2 (two) times daily. (Patient not taking: Reported on 11/28/2019)   No facility-administered encounter medications on file as of 11/28/2019.    Allergies (verified) Aspirin, Conray [iothalamate], Dye fdc red [red dye], Sulfa antibiotics, and Prednisone   History: Past Medical History:  Diagnosis Date  . Dermatophytosis of nail   . GERD (gastroesophageal reflux disease)   . History of shingles   . Hypertension   . Keratoderma    Past Surgical History:  Procedure Laterality Date  . BREAST BIOPSY Right 09/30/2017   Affirm bx-calcs ( X clip),benign  . BREAST EXCISIONAL BIOPSY Right    neg  . cataracts    . COLONOSCOPY W/ POLYPECTOMY    . COLONOSCOPY WITH PROPOFOL N/A 02/10/2017   Procedure: COLONOSCOPY WITH PROPOFOL;  Surgeon: Manya Silvas, MD;  Location[facility] ENDOSCOPY;  Service: Endoscopy;  Laterality: N/A;  . ESOPHAGOGASTRODUODENOSCOPY (EGD) WITH PROPOFOL N/A 02/10/2017   Procedure: ESOPHAGOGASTRODUODENOSCOPY (EGD) WITH PROPOFOL;  Surgeon: Manya Silvas, MD;  Location: [facility] ENDOSCOPY;  Service: Endoscopy;  Laterality: N/A;  . EYE SURGERY     bilateral cataracts  . HAND SURGERY    . KIDNEY STONE SURGERY    . SACROPLASTY N/A 07/06/2019   Procedure: SACROPLASTY;  Surgeon: Hessie Knows, MD;  Location: [facility] ORS;  Service: Orthopedics;  Laterality: N/A;  . TONSILECTOMY, ADENOIDECTOMY, BILATERAL MYRINGOTOMY AND TUBES    . TONSILLECTOMY    . TRIGGER FINGER RELEASE     Left ring finger  . tubercular peritonitis     Family History  Adopted: Yes    Problem Relation Age of Onset  . Breast cancer Neg Hx    Social History   Socioeconomic History  . Marital status: Widowed    Spouse name: Not on file  . Number of children: 2  . Years of education: Not on file  . Highest education level: Some college, no degree  Occupational History  . Not on file  Tobacco Use  . Smoking status: Never Smoker  . Smokeless tobacco: Never Used  Vaping Use  . Vaping Use: Never used  Substance and Sexual Activity  . Alcohol use: Not Currently  . Drug use: No  . Sexual activity: Not on file  Other Topics Concern  . Not on file  Social History Narrative  . Not on file   Social Determinants of Health   Financial Resource Strain: Low Risk   . Difficulty of Paying Living Expenses: Not hard at all  Food Insecurity: No Food Insecurity  . Worried About Charity fundraiser in the Last Year: Never true  . Ran Out of Food in the Last  Year: Never true  Transportation Needs: No Transportation Needs  . Lack of Transportation (Medical): No  . Lack of Transportation (Non-Medical): No  Physical Activity: Inactive  . Days of Exercise per Week: 0 days  . Minutes of Exercise per Session: 0 min  Stress: No Stress Concern Present  . Feeling of Stress : Not at all  Social Connections: Socially Isolated  . Frequency of Communication with Friends and Family: More than three times a week  . Frequency of Social Gatherings with Friends and Family: More than three times a week  . Attends Religious Services: Never  . Active Member of Clubs or Organizations: No  . Attends Archivist Meetings: Never  . Marital Status: Widowed    Tobacco Counseling Counseling given: Not Answered   Clinical Intake:  Pre-visit preparation completed: Yes  Pain : No/denies pain     Nutritional Status: BMI of 19-24  Normal Nutritional Risks: Unintentional weight gain Diabetes: Yes  How often do you need to have someone help you when you read instructions,  pamphlets, or other written materials from your doctor or pharmacy?: 1 - Never What is the last grade level you completed in school?: junior college  Diabetic? Yes Nutrition Risk Assessment:  Has the patient had any N/V/D within the last 2 months?  No  Does the patient have any non-healing wounds?  No  Has the patient had any unintentional weight loss or weight gain?  Yes   Diabetes:  Is the patient diabetic?  Yes  If diabetic, was a CBG obtained today?  No  Did the patient bring in their glucometer from home?  No  How often do you monitor your CBG's? none.   Financial Strains and Diabetes Management:  Are you having any financial strains with the device, your supplies or your medication? No .  Does the patient want to be seen by Chronic Care Management for management of their diabetes?  No  Would the patient like to be referred to a Nutritionist or for Diabetic Management?  No   Diabetic Exams:  Diabetic Eye Exam: Completed 03/15/2019 Diabetic Foot Exam: Completed 09/13/2019   Interpreter Needed?: No  Information entered by :: NAllen LPN   Activities of Daily Living In your present state of health, do you have any difficulty performing the following activities: 11/28/2019 07/17/2019  Hearing? Y N  Comment have hearing aides -  Vision? N N  Difficulty concentrating or making decisions? N Y  Walking or climbing stairs? N Y  Dressing or bathing? N Y  Doing errands, shopping? N N  Preparing Food and eating ? N -  Using the Toilet? N -  In the past six months, have you accidently leaked urine? Y -  Do you have problems with loss of bowel control? N -  Managing your Medications? N -  Managing your Finances? N -  Housekeeping or managing your Housekeeping? N -  Some recent data might be hidden    Patient Care Team: Olin Hauser, DO as PCP - General (Family Medicine) Jason Coop, NP as Nurse Practitioner Greg Cutter, LCSW as [place] Management (Licensed Clinical Social Worker) Vanita Ingles, RN as Case Manager ([place]) [place], Virl Diamond, [facility] as Pharmacist (Pharmacist)  Indicate any recent Medical Services you may have received from other than [facility] providers in the past year (date may be approximate).     Assessment:   This is a routine wellness examination for [facility].  Hearing/Vision screen  Hearing Screening   _0  _1  _2  _3  _4  _5  _6  _7  _8   Right ear:           Left ear:           Vision Screening Comments: Regular eye exams, Dr. Wallace Going  Dietary issues and exercise activities discussed: Current Exercise Habits: The patient does not participate in regular exercise at present  Goals    .  DIET - INCREASE WATER INTAKE      Recommend drinking at least 6-8 glasses of water a day     .  Patient Stated      11/28/2019, wants to walk without cane while out    .  PharmD - Medication Managment      CARE PLAN ENTRY (see longitudinal plan of care for additional care plan information)   Current Barriers:  . Chronic Disease Management support, education, and care coordination needs related to T2DM, CKD, HLD, hypothyroidism, anxiety and constipation  Pharmacist Clinical Goal(s):  Marland Kitchen Over the next 30 days, patient will work with CM Pharmacist to complete medication review and address needs identified  Interventions: . Perform chart review . Today Ms. Pitz reports that she is doing well and is pleased that she is gaining some weight back . Follow up with patient regarding constipation/iron supplement o Reports that she has resumed taking the ferrous sulfate once daily with breakfast and that she is doing okay with tolerating this dose o Reports continuing bowel regimen as directed by [place]. Note patient has follow up appointment with GI on 10/26 . Follow up regarding insomina o Reports feels like her insomina is improved with her being more  active during the day o Reports has trazodone to use as directed by PCP, but denies using recently as prefers not to take . Provider and Inter-disciplinary care team collaboration (see longitudinal plan of care) . Patient denies further medication question/concerns at this time  Patient Self Care Activities:  . Attends all scheduled provider appointments o Next appointment with [facility] on 10/26 o Next appointment with Urology on 11/8  Please see past updates related to this goal by clicking on the "Past Updates" button in the selected goal      .  RNCM: Pt- "I feel better today" (pt-stated)      CARE PLAN ENTRY (see longitudinal plan of care for additional care plan information)  Current Barriers:  Marland Kitchen Knowledge Deficits related to recurrent UTI, OAB and urinary incontinence  . Care Coordination needs related to urinary health in a patient with recurrent UTIs/OAD/Urinary incontinence  . Chronic Disease Management support and education needs related to frequent UTI, OAD, and urinary incontinence  . Lacks caregiver support.   Nurse Case Manager Clinical Goal(s):  Marland Kitchen Over the next 120 days, patient will verbalize understanding of plan for treatment and prevention of UTIs . Over the next 120 days, patient will work with [facility] and pcp to address needs related to UTIs/OAB/Urinary incontinence  . Over the next 120 days, patient will attend all scheduled medical appointments: sees urology specialist on 09-29-2019. Sees pcp again on 10-16-2019 at 1:40 pm . Over the next 120 days, patient will verbalize basic understanding of cause of reoccurring UTI and urinary  disease process and self health management plan as evidenced by decrease incident of UTIs. Increased pelvic floor muscle activity by doing Kegel exercises, and better bladder control . Over the next 120 days, the patient will demonstrate ongoing self health care  management ability as evidenced by no new UTIs and  increased urinary health and maintenance.   Interventions:  . Inter-disciplinary care team collaboration (see longitudinal plan of care) . Evaluation of current treatment plan related to UTIs/OAB/Urinary incontience and patient's adherence to plan as established by provider. . Advised patient to call the provider for worsening urinary sx and sx . Provided education to patient re: kegel exercises, bladder emptying schedule, hydration, safety, and cleanliness of peri area . Discussed plans with patient for ongoing care management follow up and provided patient with direct contact information for care management team . Provided patient with urinary health educational materials related to kegal exercises and help with urinary incontinence through the my chart functionality.  . Reviewed scheduled/upcoming provider appointments including: 09-29-2019 with the specialist and 10-16-2019 with the pcp.   Patient Self Care Activities:  . Patient verbalizes understanding of plan to work with pcp, specialist and CCM team to meet urinary health needs and wellness . Attends all scheduled provider appointments . Calls provider office for new concerns or questions . Unable to independently manage UTIs/OAB/urinary incontinence   Initial goal documentation     .  RNCM: Pt- "I need a new body" (pt-stated)      CARE PLAN ENTRY (see longtitudinal plan of care for additional care plan information)  Current Barriers:  . Chronic Disease Management support, education, and care coordination needs related to HTN, HLD, DMII, CKD Stage 3, Anxiety, and hypothyroidism  Clinical Goal(s) related to HTN, HLD, DMII, CKD Stage 3, Anxiety, and Hypothyroidism:  Over the next 120 days, patient will:  . Work with the care management team to address educational, disease management, and care coordination needs  . Begin or continue self health monitoring activities as directed today Measure and record cbg (blood glucose) 1/2 times a  week, Measure and record blood pressure 2 times per week, and adhere to a heart healthy/ADA diet . Call provider office for new or worsened signs and symptoms Blood glucose findings outside established parameters, Blood pressure findings outside established parameters, Shortness of breath, and New or worsened symptom related to HLD/Anxiety/CKD/Hypothyroidism  . Call care management team with questions or concerns . Verbalize basic understanding of patient centered plan of care established today  Interventions related to HTN, HLD, DMII, CKD Stage 3, Anxiety, and hypothyroidism :  . Evaluation of current treatment plans and patient's adherence to plan as established by provider. The patient has a good understanding of her plan of care. She is thankful for the help in managing her care. The patient is currently dealing with constipation and hemorrhoids but states her symptoms are better since she saw the pcp.  She has had a  hard time over the last couple of months due to her fall and sacral fracture. 09-21-2019: The patient states the constipation is better. She is still working on getting it to where she does not have to take Miralax because she knows it is not good for her kidneys.  . Assessed patient understanding of disease states.  The patient understands her chronic conditions.  She lives alone but has help from her son. He checks on her daily. She lost her husband in April and is mourning the loss of her husband. Empathetic listening and support given. She has 2 step daughters that live in [place]. They all came and celebrated her birthday this week. This made the patient happy.  . Assessed patient's education and care coordination needs.  The patient does have a need for  transportation as her son is working a lot of overtime; however she does have some family and friends that can help with appointments. Offered a care guide referral for the patient but she verbalized  until she was walking better she did  not want to go on a bus or arranged transportation where they would drop her off and come back and get her. Eduction on the care guide team available to help as needed. The patient verbalized understanding.  . Provided disease specific education to patient.  Education on taking medications as directed, the patient verbalized compliance. Education on heatlhy/ADA diet. The patient has been losing weight. Discussed supplements.  Nash Dimmer with appropriate clinical care team members regarding patient needs. Contact information provided to the patient for the [facility].   LCSW and Pharm D are currently working with the patient. The patient is thankful for the team working with her and she is very happy with her pcp. . Evaluation of upcoming appointments: Sees pcp on 10-16-2019 at 1:40 pm.  The RNCM will plan on seeing the patient when she comes in for her visit with pcp.   Patient Self Care Activities related to HTN, HLD, DMII, CKD Stage 3, Anxiety, and hypothyroidism :  . Patient is unable to independently self-manage chronic health conditions  Please see past updates related to this goal by clicking on the "Past Updates" button in the selected goal      .  RNCM: Pt: "I am walking with a walker"      CARE PLAN ENTRY (see longitudinal plan of care for additional care plan information)  Current Barriers:  Marland Kitchen Knowledge Deficits related to fall precautions in patient with fall x 3 plus months ago with hospitalization (sacral fracture) and other chronic conditions.  . Decreased adherence to prescribed treatment for fall prevention . Care Coordination needs related to possible need for transportation/in home services in a patient with HTN/CKD3/Dm2/Hypothyroidism and Anxiety (disease states) . Lacks caregiver support. Son is very supportive but works at [employer] and is not always able to take her to appointments. Has 2 step-daughters that live in [place] . Transportation barriers- has to depend on family and  friends for transporation   Clinical Goal(s):  Marland Kitchen Over the next 120 days, patient will demonstrate improved adherence to prescribed treatment plan for decreasing falls as evidenced by patient reporting and review of EMR . Over the next 120 days, patient will verbalize using fall risk reduction strategies discussed . Over the next 120 days, patient will not experience additional falls   Interventions:  . Provided written and verbal education re: Potential causes of falls and Fall prevention strategies . Reviewed medications and discussed potential side effects of medications such as dizziness and frequent urination . Assessed for s/s of orthostatic hypotension . Assessed for falls since last encounter. Has not had any new falls at this time. 09-21-2019: The patient denies any falls and the patient is being safe. She continues to use her walker without difficulty.  . Assessed patients knowledge of fall risk prevention secondary to previously provided education. . Assessed working status of life alert bracelet and patient adherence . Provided patient information for fall alert systems . Discussed plans with patient for ongoing care management follow up and provided patient with direct contact information for care management team  Patient Self Care Activities:  . Utilize walker (assistive device) appropriately with all ambulation . De-clutter walkways . Change positions slowly . Wear secure fitting shoes at all times with ambulation .  Utilize home lighting for dim lit areas . Have self and pet awareness at all times . Working with PT/OT and nursing in the home for strengthening and support  Plan: . CCM RN CM will follow up in 14 days   Please see past updates related to this goal by clicking on the "Past Updates" button in the selected goal        Depression Screen PHQ 2/9 Scores 11/28/2019 09/14/2019 09/07/2019 03/15/2019 01/09/2019 11/01/2018 09/12/2018  PHQ - 2 Score 0 0 0 0 0 0 0  PHQ-  9 Score - 6 - 0 3 - 0  Exception Documentation - - - - - - -    Fall Risk Fall Risk  11/28/2019 09/13/2019 03/15/2019 11/01/2018 09/12/2018  Falls in the past year? 1 0 0 0 0  Number falls in past yr: 1 0 0 0 0  Injury with Fall? 1 0 0 0 0  Comment cracked pelvis - - - -  Risk for fall due to : Impaired balance/gait;Medication side effect - - - -  Follow up Falls evaluation completed;Education provided;Falls prevention discussed Falls evaluation completed Falls evaluation completed - -    Any stairs in or around the home? Yes  If so, are there any without handrails? No  Home free of loose throw rugs in walkways, pet beds, electrical cords, etc? Yes  Adequate lighting in your home to reduce risk of falls? Yes   ASSISTIVE DEVICES UTILIZED TO PREVENT FALLS:  Life alert? No  Use of a cane, walker or w/c? Yes  Grab bars in the bathroom? Yes  Shower chair or bench in shower? Yes  Elevated toilet seat or a handicapped toilet? No   TIMED UP AND GO:  Was the test performed? No .   Cognitive Function:     6CIT Screen 11/28/2019 11/01/2018 10/19/2017  What Year? 0 points 0 points 0 points  What month? 0 points 0 points 0 points  What time? 0 points 0 points 0 points  Count back from 20 0 points 0 points 0 points  Months in reverse 0 points 0 points 0 points  Repeat phrase 2 points 0 points 0 points  Total Score 2 0 0    Immunizations Immunization History  Administered Date(s) Administered  . Fluad Quad(high Dose 65+) 11/01/2018  . Influenza, High Dose Seasonal PF 10/28/2016, 10/19/2017  . Influenza-Unspecified 10/17/2011, 11/09/2012, 10/30/2013, 11/02/2014, 11/10/2015  . PFIZER SARS-COV-2 Vaccination 03/26/2019, 04/19/2019  . Pneumococcal Conjugate-13 12/10/2012  . Pneumococcal Polysaccharide-23 08/25/2016  . Zoster Recombinat (Shingrix) 12/11/2016, 08/28/2017    TDAP status: Up to date Flu Vaccine status: will get at a later date Pneumococcal vaccine status: Up to  date Covid-19 vaccine status: Completed vaccines  Qualifies for Shingles Vaccine? Yes   Zostavax completed No   Shingrix Completed?: Yes  Screening Tests Health Maintenance  Topic Date Due  . INFLUENZA VACCINE  08/20/2019  . HEMOGLOBIN A1C  03/08/2020  . OPHTHALMOLOGY EXAM  03/14/2020  . FOOT EXAM  09/12/2020  . TETANUS/TDAP  09/25/2020  . DEXA SCAN  Completed  . COVID-19 Vaccine  Completed  . PNA vac Low Risk Adult  Completed    Health Maintenance  Health Maintenance Due  Topic Date Due  . INFLUENZA VACCINE  08/20/2019    Colorectal cancer screening: No longer required.  Mammogram status: No longer required.  Bone Density status: Completed 09/02/2016. Results reflect: Bone density results: OSTEOPENIA. Repeat every 5 years.  Lung Cancer Screening: (Low Dose CT Chest  recommended if Age 18-80 years, 82 pack-year currently smoking OR have quit w/in 15years.) does not qualify.   Lung Cancer Screening Referral: no  Additional Screening:  Hepatitis C Screening: does not qualify;   Vision Screening: Recommended annual ophthalmology exams for early detection of glaucoma and other disorders of the eye. Is the patient up to date with their annual eye exam?  Yes  Who is the provider or what is the name of the office in which the patient attends annual eye exams? Dr. Wallace Going If pt is not established with a provider, would they like to be referred to a provider to establish care? Yes .   Dental Screening: Recommended annual dental exams for proper oral hygiene  Community Resource Referral / Chronic Care Management: CRR required this visit?  No   CCM required this visit?  No      Plan:     I have personally reviewed and noted the following in the patient's chart:   . Medical and social history . Use of alcohol, tobacco or illicit drugs  . Current medications and supplements . Functional ability and status . Nutritional status . Physical activity . Advanced  directives . List of other physicians . Hospitalizations, surgeries, and ER visits in previous 12 months . Vitals . Screenings to include cognitive, depression, and falls . Referrals and appointments  In addition, I have reviewed and discussed with patient certain preventive protocols, quality metrics, and best practice recommendations. A written personalized care plan for preventive services as well as general preventive health recommendations were provided to patient.     Kellie Simmering, LPN   94/07/759   Nurse Notes:

## 2019-12-02 ENCOUNTER — Other Ambulatory Visit: Payer: Self-pay | Admitting: Family Medicine

## 2019-12-02 DIAGNOSIS — E038 Other specified hypothyroidism: Secondary | ICD-10-CM

## 2019-12-02 DIAGNOSIS — E063 Autoimmune thyroiditis: Secondary | ICD-10-CM

## 2019-12-02 NOTE — Telephone Encounter (Signed)
Requested Prescriptions  Pending Prescriptions Disp Refills  . levothyroxine (SYNTHROID) 25 MCG tablet [Pharmacy Med Name: LEVOTHYROXINE SODIUM 25 MCG TAB] 90 tablet 2    Sig: TAKE 1 TABLET BY MOUTH ONCE DAILY ON AN EMPTY STOMACH. WAIT 30 MINUTES BEFORE TAKING OTHER MEDS.     Endocrinology:  Hypothyroid Agents Failed - 12/02/2019 10:58 AM      Failed - TSH needs to be rechecked within 3 months after an abnormal result. Refill until TSH is due.      Failed - TSH in normal range and within 360 days    TSH  Date Value Ref Range Status  09/06/2019 5.61 (H) 0.40 - 4.50 mIU/L Final         Passed - Valid encounter within last 12 months    Recent Outpatient Visits          1 month ago Psychophysiological insomnia   [facility] [place], Devonne Doughty, DO   1 month ago Psychophysiological insomnia   [facility], Lupita Raider, FNP   2 months ago Acute cystitis without hematuria   [place], DO   2 months ago Controlled type 2 diabetes mellitus with diabetic nephropathy, without long-term current use of insulin [facility])   [place], DO   3 months ago Therapeutic opioid-induced constipation (OIC)   [facility] Olin Hauser, DO      Future Appointments            In 1 month [place], Devonne Doughty, [facility], [place]   In 1 year MacDiarmid, Nicki Reaper, MD [place]   In 1 year  [facility], [facility]

## 2019-12-05 ENCOUNTER — Other Ambulatory Visit: Payer: Self-pay | Admitting: Family Medicine

## 2019-12-05 ENCOUNTER — Ambulatory Visit: Payer: PPO | Admitting: Licensed Clinical Social Worker

## 2019-12-05 DIAGNOSIS — E1169 Type 2 diabetes mellitus with other specified complication: Secondary | ICD-10-CM

## 2019-12-05 DIAGNOSIS — D631 Anemia in chronic kidney disease: Secondary | ICD-10-CM

## 2019-12-05 DIAGNOSIS — N1831 Chronic kidney disease, stage 3a: Secondary | ICD-10-CM

## 2019-12-05 DIAGNOSIS — F418 Other specified anxiety disorders: Secondary | ICD-10-CM

## 2019-12-05 DIAGNOSIS — E785 Hyperlipidemia, unspecified: Secondary | ICD-10-CM

## 2019-12-05 DIAGNOSIS — F5104 Psychophysiologic insomnia: Secondary | ICD-10-CM

## 2019-12-05 DIAGNOSIS — N184 Chronic kidney disease, stage 4 (severe): Secondary | ICD-10-CM

## 2019-12-05 DIAGNOSIS — E1122 Type 2 diabetes mellitus with diabetic chronic kidney disease: Secondary | ICD-10-CM

## 2019-12-05 NOTE — Telephone Encounter (Signed)
Pt has been r/s  

## 2019-12-05 NOTE — Chronic Care Management (AMB) (Signed)
Chronic Care Management    Clinical Social Work Follow Up Note  12/05/2019 Name: Lisa Mills MRN: [id number] DOB: 1929-06-23  Lisa Mills is a 86 y.o. year old female who is a primary care patient of Olin Hauser, DO. The CCM team was consulted for assistance with Level of Care Concerns.   Review of patient status, including review of consultants reports, other relevant assessments, and collaboration with appropriate care team members and the patient's provider was performed as part of comprehensive patient evaluation and provision of chronic care management services.    SDOH (Social Determinants of Health) assessments performed: Yes    Outpatient Encounter Medications as of 12/05/2019  Medication Sig   Blood Glucose Monitoring Suppl ([place]) w/Device KIT Use glucometer to check blood sugar daily.   Camphor-Menthol-Methyl Sal (SALONPAS) 3.01-25-08 % PTCH Apply 1 patch topically 2 (two) times daily. (Patient not taking: Reported on 11/28/2019)   cholecalciferol (VITAMIN D) 1000 units tablet Take 2,000 Units by mouth daily.    escitalopram (LEXAPRO) 5 MG tablet TAKE 1 TABLET BY MOUTH ONCE DAILY WITH FOOD (Patient taking differently: Take 5 mg by mouth daily. )   feeding supplement, ENSURE ENLIVE, (ENSURE ENLIVE) LIQD Take 237 mLs by mouth 3 (three) times daily.   ferrous sulfate 325 (65 FE) MG tablet Take 325 mg by mouth daily.    glucose blood (ONE TOUCH ULTRA TEST) test strip CHECK BLOOD SUGAR UP TO 2 TIMES A DAY.   hydrocortisone (ANUSOL-HC) 2.5 % rectal cream USE 1 APPLICATION RECTALLY TWICE DAILY AS DIRECTED   hydrocortisone (ANUSOL-HC) 25 MG suppository Place 1 suppository (25 mg total) rectally 2 (two) times daily. For 7 days for hemorrhoid   hydrocortisone 2.5 % cream Place rectally.    levothyroxine (SYNTHROID) 25 MCG tablet TAKE 1 TABLET BY MOUTH ONCE DAILY ON AN EMPTY STOMACH. WAIT 30 MINUTES BEFORE TAKING OTHER MEDS.   lisinopril  (ZESTRIL) 5 MG tablet Take 5 mg by mouth daily.   nitrofurantoin (MACRODANTIN) 100 MG capsule Take 1 capsule (100 mg total) by mouth daily.   omeprazole (PRILOSEC) 40 MG capsule Take 1 tablet 30 mins before breakfast and 1 tablet 30 mins before dinner. Take on an empty stomach, without other medications.   ONETOUCH DELICA LANCETS 61W MISC 1 each by Does not apply route 3 (three) times daily. Dx:E11.9   polyethylene glycol (MIRALAX / GLYCOLAX) 17 g packet Take 17 g by mouth daily as needed for moderate constipation or severe constipation.   senna (SENOKOT) 8.6 MG TABS tablet Take 1 tablet (8.6 mg total) by mouth 2 (two) times daily.   simvastatin (ZOCOR) 40 MG tablet TAKE 1 TABLET BY MOUTH AT BEDTIME (Patient taking differently: Take 40 mg by mouth at bedtime. )   traZODone (DESYREL) 50 MG tablet Take 0.5-1 tablets (25-50 mg total) by mouth at bedtime as needed for sleep.   No facility-administered encounter medications on file as of 12/05/2019.     Goals Addressed     SW-Find Help in My Community       Follow Up Date 19 days from 12/05/19   - begin a notebook of services in my neighborhood or community - call 211 when I need some help - follow-up on any referrals for help I am given - think ahead to make sure my need does not become an emergency - make a note about what I need to have by the phone or take with me, like an identification card or  social security number have a back-up plan - have a back-up plan - make a list of family or friends that I can call -consider hiring in home support since having to discontinue services with last aide    Why is this important?   Knowing how and where to find help for yourself or family in your neighborhood and community is an important skill.  You will want to take some steps to learn how.    Notes: Son comes daily for several hours to check on patient.     SW-Matintain My Quality of Life       Follow Up Date - 69 days from 12/05/19     - check out options for in-home help, long-term care or hospice - complete a living will - discuss my treatment options with the doctor or nurse - do one enjoyable thing every day - do something different, like talking to a new person or going to a new place, every day - learn something new by asking, reading and searching the Internet every day - make an audio or video recording for my loved ones - make shared treatment decisions with doctor - meditate daily - name a health care proxy (decision maker) - share memories using a picture album or scrapbook with my loved ones - spend time with a child every day, borrow one if I have to - spend time outdoors at least 3 times a week - strengthen or fix relationships with loved ones  -maintain healthy eating and drinking. Patient continues to lose weight.  -take cane and phone with her everywhere at all times -continue healthy socialization    Why is this important?   Having a long-term illness can be scary.  It can also be stressful for you and your caregiver.  These steps may help.    Notes:     Follow Up Plan: SW will follow up with patient by phone over the next quarter  Eula Fried, [place], MSW, [email or web address] Phone: [phone]

## 2019-12-05 NOTE — Chronic Care Management (AMB) (Deleted)
Chronic Care Management    Clinical Social Work Follow Up Note  12/05/2019 Name: Lisa Mills MRN: [id number] DOB: 03-Jul-1929  Lisa Mills is a 86 y.o. year old female who is a primary care patient of Olin Hauser, DO. The CCM team was consulted for assistance with Level of Care Concerns.   Review of patient status, including review of consultants reports, other relevant assessments, and collaboration with appropriate care team members and the patient's provider was performed as part of comprehensive patient evaluation and provision of chronic care management services.    SDOH (Social Determinants of Health) assessments performed: Yes    Outpatient Encounter Medications as of 12/05/2019  Medication Sig  . Blood Glucose Monitoring Suppl ([place]) w/Device KIT Use glucometer to check blood sugar daily.  . Camphor-Menthol-Methyl Sal (SALONPAS) 3.01-25-08 % PTCH Apply 1 patch topically 2 (two) times daily. (Patient not taking: Reported on 11/28/2019)  . cholecalciferol (VITAMIN D) 1000 units tablet Take 2,000 Units by mouth daily.   Marland Kitchen escitalopram (LEXAPRO) 5 MG tablet TAKE 1 TABLET BY MOUTH ONCE DAILY WITH FOOD (Patient taking differently: Take 5 mg by mouth daily. )  . feeding supplement, ENSURE ENLIVE, (ENSURE ENLIVE) LIQD Take 237 mLs by mouth 3 (three) times daily.  . ferrous sulfate 325 (65 FE) MG tablet Take 325 mg by mouth daily.   Marland Kitchen glucose blood (ONE TOUCH ULTRA TEST) test strip CHECK BLOOD SUGAR UP TO 2 TIMES A DAY.  . hydrocortisone (ANUSOL-HC) 2.5 % rectal cream USE 1 APPLICATION RECTALLY TWICE DAILY AS DIRECTED  . hydrocortisone (ANUSOL-HC) 25 MG suppository Place 1 suppository (25 mg total) rectally 2 (two) times daily. For 7 days for hemorrhoid  . hydrocortisone 2.5 % cream Place rectally.   Marland Kitchen levothyroxine (SYNTHROID) 25 MCG tablet TAKE 1 TABLET BY MOUTH ONCE DAILY ON AN EMPTY STOMACH. WAIT 30 MINUTES BEFORE TAKING OTHER MEDS.  Marland Kitchen lisinopril  (ZESTRIL) 5 MG tablet Take 5 mg by mouth daily.  . nitrofurantoin (MACRODANTIN) 100 MG capsule Take 1 capsule (100 mg total) by mouth daily.  Mar[place] Kitchen omeprazole (PRILOSEC) 40 MG capsule Take 1 tablet 30 mins before breakfast and 1 tablet 30 mins before dinner. Take on an empty stomach, without other medications.  Glory Rose[place]ush DELICA LANCETS 49S MISC 1 each by Does not apply route 3 (three) times daily. Dx:E11.9  . polyethylene glycol (MIRALAX / GLYCOLAX) 17 g packet Take 17 g by mouth daily as needed for moderate constipation or severe constipation.  . senna (SENOKOT) 8.6 MG TABS tablet Take 1 tablet (8.6 mg total) by mouth 2 (two) times daily.  . simvastatin (ZOCOR) 40 MG tablet TAKE 1 TABLET BY MOUTH AT BEDTIME (Patient taking differently: Take 40 mg by mouth at bedtime. )  . traZODone (DESYREL) 50 MG tablet Take 0.5-1 tablets (25-50 mg total) by mouth at bedtime as needed for sleep.   No facility-administered encounter medications on file as of 12/05/2019.     Goals Addressed            This Visit's Progress   . SW-Find Help in My Community       Follow Up Date 90 days from 12/05/19   - begin a notebook of services in my neighborhood or community - call 211 when I need some help - follow-up on any referrals for help I am given - think ahead to make sure my need does not become an emergency - make a note about what I need to  have by the phone or take with me, like an identification card or social security number have a back-up plan - have a back-up plan - make a list of family or friends that I can call -consider hiring in home support since having to discontinue services with last aide    Why is this important?   Knowing how and where to find help for yourself or family in your neighborhood and community is an important skill.  You will want to take some steps to learn how.    Notes: Son comes daily for several hours to check on patient.    Marland Kitchen SW-Matintain My Quality of Life        Follow Up Date - 40 days from 12/05/19   - check out options for in-home help, long-term care or hospice - complete a living will - discuss my treatment options with the doctor or nurse - do one enjoyable thing every day - do something different, like talking to a new person or going to a new place, every day - learn something new by asking, reading and searching the Internet every day - make an audio or video recording for my loved ones - make shared treatment decisions with doctor - meditate daily - name a health care proxy (decision maker) - share memories using a picture album or scrapbook with my loved ones - spend time with a child every day, borrow one if I have to - spend time outdoors at least 3 times a week - strengthen or fix relationships with loved ones  -maintain healthy eating and drinking. Patient continues to lose weight.  -take cane and phone with her everywhere at all times -continue healthy socialization  -patient reports that she is driving herself now   Why is this important?   Having a long-term illness can be scary.  It can also be stressful for you and your caregiver.  These steps may help.    Notes: Pt wishes for son to decrease his daily visits but patient was advised that she needs some sort of in home support at this time and this is very beneficial for her.     Follow Up Plan: SW will follow up with patient by phone over the next quarter   SIGNATURE

## 2019-12-05 NOTE — Telephone Encounter (Signed)
Requested Prescriptions  Pending Prescriptions Disp Refills   escitalopram (LEXAPRO) 5 MG tablet [Pharmacy Med Name: ESCITALOPRAM OXALATE 5 MG TAB] 90 tablet 0    Sig: TAKE 1 TABLET BY MOUTH ONCE DAILY WITH FOOD     Psychiatry:  Antidepressants - SSRI Passed - 12/05/2019  5:19 PM      Passed - Completed PHQ-2 or PHQ-9 in the last 360 days      Passed - Valid encounter within last 6 months    Recent Outpatient Visits          1 month ago Psychophysiological insomnia   [facility] [place], Devonne Doughty, DO   2 months ago Psychophysiological insomnia   [facility], Lupita Raider, FNP   2 months ago Acute cystitis without hematuria   [place], DO   2 months ago Controlled type 2 diabetes mellitus with diabetic nephropathy, without long-term current use of insulin [facility])   [place], DO   3 months ago Therapeutic opioid-induced constipation (OIC)   [facility] [place], Devonne Doughty, DO      Future Appointments            In 4 weeks [place], Devonne Doughty, DO [facility], [place]   In 12 months MacDiarmid, Nicki Reaper, MD [place]   In 12 months  [facility], [facility]

## 2019-12-11 ENCOUNTER — Ambulatory Visit: Payer: Self-pay | Admitting: General Practice

## 2019-12-11 ENCOUNTER — Telehealth: Payer: Self-pay | Admitting: General Practice

## 2019-12-11 ENCOUNTER — Telehealth: Payer: PPO | Admitting: General Practice

## 2019-12-11 DIAGNOSIS — N184 Chronic kidney disease, stage 4 (severe): Secondary | ICD-10-CM

## 2019-12-11 DIAGNOSIS — E1122 Type 2 diabetes mellitus with diabetic chronic kidney disease: Secondary | ICD-10-CM

## 2019-12-11 DIAGNOSIS — E1169 Type 2 diabetes mellitus with other specified complication: Secondary | ICD-10-CM

## 2019-12-11 DIAGNOSIS — E785 Hyperlipidemia, unspecified: Secondary | ICD-10-CM

## 2019-12-11 DIAGNOSIS — K5903 Drug induced constipation: Secondary | ICD-10-CM

## 2019-12-11 DIAGNOSIS — N3946 Mixed incontinence: Secondary | ICD-10-CM

## 2019-12-11 DIAGNOSIS — N183 Chronic kidney disease, stage 3 unspecified: Secondary | ICD-10-CM

## 2019-12-11 DIAGNOSIS — F418 Other specified anxiety disorders: Secondary | ICD-10-CM

## 2019-12-11 DIAGNOSIS — I10 Essential (primary) hypertension: Secondary | ICD-10-CM

## 2019-12-11 DIAGNOSIS — R32 Unspecified urinary incontinence: Secondary | ICD-10-CM

## 2019-12-11 DIAGNOSIS — W19XXXD Unspecified fall, subsequent encounter: Secondary | ICD-10-CM

## 2019-12-11 NOTE — Telephone Encounter (Signed)
°  Chronic Care Management   Note  12/11/2019 Name: Lisa Mills MRN: [id number] DOB: [birth year]  The patient called back and new encounter completed.   Follow up plan: Telephone follow up appointment with care management team member scheduled for: as scheduled in January  [place], MSN, [place] Mobile: [phone]

## 2019-12-11 NOTE — Chronic Care Management (AMB) (Signed)
Chronic Care Management   Follow Up Note   12/11/2019 Name: Lisa Mills MRN: [id number] DOB: 08/16/1929  Referred by: Olin Hauser, DO Reason for referral : Chronic Care Management (RNCM Follow up for Chronic Disease Management and Care Coordination Needs )   Lisa Mills is a 86 y.o. year old female who is a primary care patient of Olin Hauser, DO. The CCM team was consulted for assistance with chronic disease management and care coordination needs.    Review of patient status, including review of consultants reports, relevant laboratory and other test results, and collaboration with appropriate care team members and the patient's provider was performed as part of comprehensive patient evaluation and provision of chronic care management services.    SDOH (Social Determinants of Health) assessments performed: Yes See Care Plan activities for detailed interventions related to [facility])     Outpatient Encounter Medications as of 12/11/2019  Medication Sig  . Blood Glucose Monitoring Suppl ([place]) w/Device KIT Use glucometer to check blood sugar daily.  . Camphor-Menthol-Methyl Sal (SALONPAS) 3.01-25-08 % PTCH Apply 1 patch topically 2 (two) times daily. (Patient not taking: Reported on 11/28/2019)  . cholecalciferol (VITAMIN D) 1000 units tablet Take 2,000 Units by mouth daily.   Marland Kitchen escitalopram (LEXAPRO) 5 MG tablet TAKE 1 TABLET BY MOUTH ONCE DAILY WITH FOOD  . feeding supplement, ENSURE ENLIVE, (ENSURE ENLIVE) LIQD Take 237 mLs by mouth 3 (three) times daily.  . ferrous sulfate 325 (65 FE) MG tablet Take 325 mg by mouth daily.   Marland Kitchen glucose blood (ONE TOUCH ULTRA TEST) test strip CHECK BLOOD SUGAR UP TO 2 TIMES A DAY.  . hydrocortisone (ANUSOL-HC) 2.5 % rectal cream USE 1 APPLICATION RECTALLY TWICE DAILY AS DIRECTED  . hydrocortisone (ANUSOL-HC) 25 MG suppository Place 1 suppository (25 mg total) rectally 2 (two) times daily. For 7 days for  hemorrhoid  . hydrocortisone 2.5 % cream Place rectally.   Marland Kitchen levothyroxine (SYNTHROID) 25 MCG tablet TAKE 1 TABLET BY MOUTH ONCE DAILY ON AN EMPTY STOMACH. WAIT 30 MINUTES BEFORE TAKING OTHER MEDS.  Marland Kitchen lisinopril (ZESTRIL) 5 MG tablet Take 5 mg by mouth daily.  . nitrofurantoin (MACRODANTIN) 100 MG capsule Take 1 capsule (100 mg total) by mouth daily.  Marland Kitchen omeprazole (PRILOSEC) 40 MG capsule Take 1 tablet 30 mins before breakfast and 1 tablet 30 mins before dinner. Take on an empty stomach, without other medications.  Glory Rosebush DELICA LANCETS 32G MISC 1 each by Does not apply route 3 (three) times daily. Dx:E11.9  . polyethylene glycol (MIRALAX / GLYCOLAX) 17 g packet Take 17 g by mouth daily as needed for moderate constipation or severe constipation.  . senna (SENOKOT) 8.6 MG TABS tablet Take 1 tablet (8.6 mg total) by mouth 2 (two) times daily.  . simvastatin (ZOCOR) 40 MG tablet TAKE 1 TABLET BY MOUTH AT BEDTIME (Patient taking differently: Take 40 mg by mouth at bedtime. )  . traZODone (DESYREL) 50 MG tablet Take 0.5-1 tablets (25-50 mg total) by mouth at bedtime as needed for sleep.   No facility-administered encounter medications on file as of 12/11/2019.     Objective:  BP Readings from Last 3 Encounters:  11/27/19 119/72  10/05/19 112/68  10/02/19 (!) 158/70    Goals Addressed              This Visit's Progress   .  RNCM: Pt- "I feel better today" (pt-stated)        CARE  PLAN ENTRY (see longitudinal plan of care for additional care plan information)  Current Barriers:  Marland Kitchen Knowledge Deficits related to recurrent UTI, OAB and urinary incontinence  . Care Coordination needs related to urinary health in a patient with recurrent UTIs/OAD/Urinary incontinence  . Chronic Disease Management support and education needs related to frequent UTI, OAD, and urinary incontinence  . Lacks caregiver support.   Nurse Case Manager Clinical Goal(s):  Marland Kitchen Over the next 120 days, patient will  verbalize understanding of plan for treatment and prevention of UTIs . Over the next 120 days, patient will work with [facility] and pcp to address needs related to UTIs/OAB/Urinary incontinence  . Over the next 120 days, patient will attend all scheduled medical appointments: sees urology specialist on 09-29-2019. Sees pcp again on 01-02-2020 at 1:40 pm . Over the next 120 days, patient will verbalize basic understanding of cause of reoccurring UTI and urinary  disease process and self health management plan as evidenced by decrease incident of UTIs. Increased pelvic floor muscle activity by doing Kegel exercises, and better bladder control . Over the next 120 days, the patient will demonstrate ongoing self health care management ability as evidenced by no new UTIs and increased urinary health and maintenance.   Interventions:  . Inter-disciplinary care team collaboration (see longitudinal plan of care) . Evaluation of current treatment plan related to UTIs/OAB/Urinary incontience and patient's adherence to plan as established by provider. 12-11-2019: The patient is doing well and has not had any other issues at this time with UTI's.  She states the medication is working well for her and the specialist does not need to see her for 12 months.   . Advised patient to call the provider for worsening urinary sx and sx . Provided education to patient re: kegel exercises, bladder emptying schedule, hydration, safety, and cleanliness of peri area . Discussed plans with patient for ongoing care management follow up and provided patient with direct contact information for care management team . Provided patient with urinary health educational materials related to kegal exercises and help with urinary incontinence through the my chart functionality.  . Reviewed scheduled/upcoming provider appointments including: 01-02-2020 with the pcp.   Patient Self Care Activities:  . Patient verbalizes understanding of plan to  work with pcp, specialist and CCM team to meet urinary health needs and wellness . Attends all scheduled provider appointments . Calls provider office for new concerns or questions . Unable to independently manage UTIs/OAB/urinary incontinence   Please see past updates related to this goal by clicking on the "Past Updates" button in the selected goal      .  RNCM: Pt- "I need a new body" (pt-stated)        CARE PLAN ENTRY (see longtitudinal plan of care for additional care plan information)  Current Barriers:  . Chronic Disease Management support, education, and care coordination needs related to HTN, HLD, DMII, CKD Stage 3, Anxiety, and hypothyroidism  Clinical Goal(s) related to HTN, HLD, DMII, CKD Stage 3, Anxiety, and Hypothyroidism:  Over the next 120 days, patient will:  . Work with the care management team to address educational, disease management, and care coordination needs  . Begin or continue self health monitoring activities as directed today Measure and record cbg (blood glucose) 1/2 times a week, Measure and record blood pressure 2 times per week, and adhere to a heart healthy/ADA diet . Call provider office for new or worsened signs and symptoms Blood glucose findings outside established parameters,  Blood pressure findings outside established parameters, Shortness of breath, and New or worsened symptom related to HLD/Anxiety/CKD/Hypothyroidism  . Call care management team with questions or concerns . Verbalize basic understanding of patient centered plan of care established today  Interventions related to HTN, HLD, DMII, CKD Stage 3, Anxiety, and hypothyroidism :  . Evaluation of current treatment plans and patient's adherence to plan as established by provider. The patient has a good understanding of her plan of care. She is thankful for the help in managing her care. The patient is currently dealing with constipation and hemorrhoids but states her symptoms are better since  she saw the pcp.  She has had a  hard time over the last couple of months due to her fall and sacral fracture. 12-11-2019: The patient states the constipation is better. She is still working on getting it to where she does not have to take Miralax because she knows it is not good for her kidneys. She has not taken Miralax in several days, but still takes the senna. She is doing well with this. States that her other chronic conditions are doing well at this time. She is happy and loves her time with her little dog.  She is currently making a wreath for her husbands grave. She is happy to be more independent.  . Assessed patient understanding of disease states.  The patient understands her chronic conditions.  She lives alone but has help from her son. He checks on her daily. She lost her husband in April and is mourning the loss of her husband. Empathetic listening and support given. She has 2 step daughters that live in [place]. They all came and celebrated her birthday this week. This made the patient happy. 12-11-2019: The patient states that her son still comes by every day even though she is doing much better. She is going shopping and doing things she enjoys doing.   . Assessed patient's education and care coordination needs.  The patient does have a need for transportation as her son is working a lot of overtime; however she does have some family and friends that can help with appointments. Offered a care guide referral for the patient but she verbalized  until she was walking better she did not want to go on a bus or arranged transportation where they would drop her off and come back and get her. Eduction on the care guide team available to help as needed. The patient verbalized understanding. 12-11-2019: The patient denies any needs at this time with transportation or any other SDOH.  The patient is doing well and able to drive and go grocery shopping and other things. . Provided disease specific  education to patient.  Education on taking medications as directed, the patient verbalized compliance. Education on heatlhy/ADA diet. The patient has been losing weight. Discussed supplements. 12-11-2019: The patient states she is stable right now and eating good. Watching what she eats to help better control the constipation.  Steele Sizer with appropriate clinical care team members regarding patient needs. Contact information provided to the patient for the [facility].   LCSW and Pharm D are currently working with the patient. The patient is thankful for the team working with her and she is very happy with her pcp. . Evaluation of upcoming appointments: Sees pcp on 01-02-2020 at 1:40 pm.   Patient Self Care Activities related to HTN, HLD, DMII, CKD Stage 3, Anxiety, and hypothyroidism :  . Patient is unable to independently self-manage chronic health  conditions  Please see past updates related to this goal by clicking on the "Past Updates" button in the selected goal      .  RNCM: Pt: "I am walking with a walker"        [place] (see longitudinal plan of care for additional care plan information)  Current Barriers:  Marland Kitchen Knowledge Deficits related to fall precautions in patient with fall x 3 plus months ago with hospitalization (sacral fracture) and other chronic conditions.  . Decreased adherence to prescribed treatment for fall prevention . Care Coordination needs related to possible need for transportation/in home services in a patient with HTN/CKD3/Dm2/Hypothyroidism and Anxiety (disease states) . Lacks caregiver support. Son is very supportive but works at [employer] and is not always able to take her to appointments. Has 2 step-daughters that live in [place] . Transportation barriers- has to depend on family and friends for transporation   Clinical Goal(s):  Marland Kitchen Over the next 120 days, patient will demonstrate improved adherence to prescribed treatment plan for decreasing falls as evidenced  by patient reporting and review of EMR . Over the next 120 days, patient will verbalize using fall risk reduction strategies discussed . Over the next 120 days, patient will not experience additional falls   Interventions:  . Provided written and verbal education re: Potential causes of falls and Fall prevention strategies . Reviewed medications and discussed potential side effects of medications such as dizziness and frequent urination . Assessed for s/s of orthostatic hypotension.  Denies any episodes of hypotension. . Assessed for falls since last encounter. Has not had any new falls at this time. 12-11-2019: The patient denies any falls and the patient is being safe. She continues to use her walker without difficulty.  . Assessed patients knowledge of fall risk prevention secondary to previously provided education. . Assessed working status of life alert bracelet and patient adherence . Provided patient information for fall alert systems . Discussed plans with patient for ongoing care management follow up and provided patient with direct contact information for care management team  Patient Self Care Activities:  . Utilize walker (assistive device) appropriately with all ambulation- using her walker when needed. Has it readily available. 12-11-2019: Not having trouble walking. The patient uses a cane when she is out in public. Uses a cart when she goes grocery shopping, or other places to shop.  Marland Kitchen De-clutter walkways- review  . Change positions slowly- review . Wear secure fitting shoes at all times with ambulation . Utilize home lighting for dim lit areas- review  . Have self and pet awareness at all times- review . Working with PT/OT and nursing in the home for strengthening and support- completed- patient is doing well.   Plan: . CCM RN CM will follow up in 30 to 60 days   Please see past updates related to this goal by clicking on the "Past Updates" button in the selected goal          There are no care plans to display for this patient.   Plan:   Telephone follow up appointment with care management team member scheduled for: 02-05-2020 at 11:15 am   Noreene Larsson RN, MSN, [place] Mobile: [phone]

## 2019-12-11 NOTE — Patient Instructions (Signed)
Visit Information  Goals Addressed              This Visit's Progress   .  RNCM: Pt- "I feel better today" (pt-stated)        CARE PLAN ENTRY (see longitudinal plan of care for additional care plan information)  Current Barriers:  Marland Kitchen Knowledge Deficits related to recurrent UTI, OAB and urinary incontinence  . Care Coordination needs related to urinary health in a patient with recurrent UTIs/OAD/Urinary incontinence  . Chronic Disease Management support and education needs related to frequent UTI, OAD, and urinary incontinence  . Lacks caregiver support.   Nurse Case Manager Clinical Goal(s):  Marland Kitchen Over the next 120 days, patient will verbalize understanding of plan for treatment and prevention of UTIs . Over the next 120 days, patient will work with [facility] and pcp to address needs related to UTIs/OAB/Urinary incontinence  . Over the next 120 days, patient will attend all scheduled medical appointments: sees urology specialist on 09-29-2019. Sees pcp again on 01-02-2020 at 1:40 pm . Over the next 120 days, patient will verbalize basic understanding of cause of reoccurring UTI and urinary  disease process and self health management plan as evidenced by decrease incident of UTIs. Increased pelvic floor muscle activity by doing Kegel exercises, and better bladder control . Over the next 120 days, the patient will demonstrate ongoing self health care management ability as evidenced by no new UTIs and increased urinary health and maintenance.   Interventions:  . Inter-disciplinary care team collaboration (see longitudinal plan of care) . Evaluation of current treatment plan related to UTIs/OAB/Urinary incontience and patient's adherence to plan as established by provider. 12-11-2019: The patient is doing well and has not had any other issues at this time with UTI's.  She states the medication is working well for her and the specialist does not need to see her for 12 months.   . Advised patient to  call the provider for worsening urinary sx and sx . Provided education to patient re: kegel exercises, bladder emptying schedule, hydration, safety, and cleanliness of peri area . Discussed plans with patient for ongoing care management follow up and provided patient with direct contact information for care management team . Provided patient with urinary health educational materials related to kegal exercises and help with urinary incontinence through the my chart functionality.  . Reviewed scheduled/upcoming provider appointments including: 01-02-2020 with the pcp.   Patient Self Care Activities:  . Patient verbalizes understanding of plan to work with pcp, specialist and CCM team to meet urinary health needs and wellness . Attends all scheduled provider appointments . Calls provider office for new concerns or questions . Unable to independently manage UTIs/OAB/urinary incontinence   Please see past updates related to this goal by clicking on the "Past Updates" button in the selected goal      .  RNCM: Pt- "I need a new body" (pt-stated)        CARE PLAN ENTRY (see longtitudinal plan of care for additional care plan information)  Current Barriers:  . Chronic Disease Management support, education, and care coordination needs related to HTN, HLD, DMII, CKD Stage 3, Anxiety, and hypothyroidism  Clinical Goal(s) related to HTN, HLD, DMII, CKD Stage 3, Anxiety, and Hypothyroidism:  Over the next 120 days, patient will:  . Work with the care management team to address educational, disease management, and care coordination needs  . Begin or continue self health monitoring activities as directed today Measure and record cbg (  blood glucose) 1/2 times a week, Measure and record blood pressure 2 times per week, and adhere to a heart healthy/ADA diet . Call provider office for new or worsened signs and symptoms Blood glucose findings outside established parameters, Blood pressure findings outside  established parameters, Shortness of breath, and New or worsened symptom related to HLD/Anxiety/CKD/Hypothyroidism  . Call care management team with questions or concerns . Verbalize basic understanding of patient centered plan of care established today  Interventions related to HTN, HLD, DMII, CKD Stage 3, Anxiety, and hypothyroidism :  . Evaluation of current treatment plans and patient's adherence to plan as established by provider. The patient has a good understanding of her plan of care. She is thankful for the help in managing her care. The patient is currently dealing with constipation and hemorrhoids but states her symptoms are better since she saw the pcp.  She has had a  hard time over the last couple of months due to her fall and sacral fracture. 12-11-2019: The patient states the constipation is better. She is still working on getting it to where she does not have to take Miralax because she knows it is not good for her kidneys. She has not taken Miralax in several days, but still takes the senna. She is doing well with this. States that her other chronic conditions are doing well at this time. She is happy and loves her time with her little dog.  She is currently making a wreath for her husbands grave. She is happy to be more independent.  . Assessed patient understanding of disease states.  The patient understands her chronic conditions.  She lives alone but has help from her son. He checks on her daily. She lost her husband in April and is mourning the loss of her husband. Empathetic listening and support given. She has 2 step daughters that live in [place]. They all came and celebrated her birthday this week. This made the patient happy. 12-11-2019: The patient states that her son still comes by every day even though she is doing much better. She is going shopping and doing things she enjoys doing.   . Assessed patient's education and care coordination needs.  The patient does have a need for  transportation as her son is working a lot of overtime; however she does have some family and friends that can help with appointments. Offered a care guide referral for the patient but she verbalized  until she was walking better she did not want to go on a bus or arranged transportation where they would drop her off and come back and get her. Eduction on the care guide team available to help as needed. The patient verbalized understanding. 12-11-2019: The patient denies any needs at this time with transportation or any other SDOH.  The patient is doing well and able to drive and go grocery shopping and other things. . Provided disease specific education to patient.  Education on taking medications as directed, the patient verbalized compliance. Education on heatlhy/ADA diet. The patient has been losing weight. Discussed supplements. 12-11-2019: The patient states she is stable right now and eating good. Watching what she eats to help better control the constipation.  Nash Dimmer with appropriate clinical care team members regarding patient needs. Contact information provided to the patient for the [facility].   LCSW and Pharm D are currently working with the patient. The patient is thankful for the team working with her and she is very happy with her pcp. Marland Kitchen  Evaluation of upcoming appointments: Sees pcp on 01-02-2020 at 1:40 pm.   Patient Self Care Activities related to HTN, HLD, DMII, CKD Stage 3, Anxiety, and hypothyroidism :  . Patient is unable to independently self-manage chronic health conditions  Please see past updates related to this goal by clicking on the "Past Updates" button in the selected goal      .  RNCM: Pt: "I am walking with a walker"        CARE PLAN ENTRY (see longitudinal plan of care for additional care plan information)  Current Barriers:  Marland Kitchen Knowledge Deficits related to fall precautions in patient with fall x 3 plus months ago with hospitalization (sacral fracture) and other  chronic conditions.  . Decreased adherence to prescribed treatment for fall prevention . Care Coordination needs related to possible need for transportation/in home services in a patient with HTN/CKD3/Dm2/Hypothyroidism and Anxiety (disease states) . Lacks caregiver support. Son is very supportive but works at [employer] and is not always able to take her to appointments. Has 2 step-daughters that live in [place] . Transportation barriers- has to depend on family and friends for transporation   Clinical Goal(s):  Marland Kitchen Over the next 120 days, patient will demonstrate improved adherence to prescribed treatment plan for decreasing falls as evidenced by patient reporting and review of EMR . Over the next 120 days, patient will verbalize using fall risk reduction strategies discussed . Over the next 120 days, patient will not experience additional falls   Interventions:  . Provided written and verbal education re: Potential causes of falls and Fall prevention strategies . Reviewed medications and discussed potential side effects of medications such as dizziness and frequent urination . Assessed for s/s of orthostatic hypotension.  Denies any episodes of hypotension. . Assessed for falls since last encounter. Has not had any new falls at this time. 12-11-2019: The patient denies any falls and the patient is being safe. She continues to use her walker without difficulty.  . Assessed patients knowledge of fall risk prevention secondary to previously provided education. . Assessed working status of life alert bracelet and patient adherence . Provided patient information for fall alert systems . Discussed plans with patient for ongoing care management follow up and provided patient with direct contact information for care management team  Patient Self Care Activities:  . Utilize walker (assistive device) appropriately with all ambulation- using her walker when needed. Has it readily available. 12-11-2019: Not  having trouble walking. The patient uses a cane when she is out in public. Uses a cart when she goes grocery shopping, or other places to shop.  Marland Kitchen De-clutter walkways- review  . Change positions slowly- review . Wear secure fitting shoes at all times with ambulation . Utilize home lighting for dim lit areas- review  . Have self and pet awareness at all times- review . Working with PT/OT and nursing in the home for strengthening and support- completed- patient is doing well.   Plan: . CCM RN CM will follow up in 30 to 60 days   Please see past updates related to this goal by clicking on the "Past Updates" button in the selected goal         The patient verbalized understanding of instructions, educational materials, and care plan provided today and declined offer to receive copy of patient instructions, educational materials, and care plan.   Telephone follow up appointment with care management team member scheduled for: 02-05-2020 at 11:15 am  Noreene Larsson RN, MSN,  [facility] Mobile: [phone]

## 2019-12-19 ENCOUNTER — Telehealth: Payer: Self-pay

## 2019-12-19 NOTE — Telephone Encounter (Signed)
10:05AM: Palliative care SW outreached patient to complete telephonic visit.   Palliative care SW outreached patient to complete telephonic visit. Patient provided update on medical condition and/or changes. Patient shared that she has been doing well, shares that she had a follow up appointment provider last week and is not having any issues with urine or UTI's. Patient shares that she had a good thanksgiving, her family came to visit her and she was happy to see them.  No current pain reported. No recent falls. Patients appetite is the same she is eating more now that she has her appetite back. She has not loss any weight that she can tell. Patient shared that she was still experiencing some constipation and when she took too much miralax she would have loose bowels/diarrhea. Patient saw GI a couple weeks and was advised to take Metamucil cookies and may be receiving PT to assist with pelvic muscles. Patient denies financial, food insecurities or issues with transportation. SW continues to encourage patient to outreach SW should she want to look into another private duty home care aid, patient states that she is maintaining fine for now. No other psychosocial needs. Patient appreciative of telephonic check in.   Plan: Palliative care will continue to monitor and assist with long term care planning as needed.

## 2020-01-02 ENCOUNTER — Ambulatory Visit: Payer: PPO | Admitting: Family Medicine

## 2020-01-02 ENCOUNTER — Encounter: Payer: Self-pay | Admitting: Family Medicine

## 2020-01-02 NOTE — Progress Notes (Signed)
Updated patients booster provided by patients card

## 2020-01-04 ENCOUNTER — Emergency Department: Payer: PPO

## 2020-01-04 ENCOUNTER — Other Ambulatory Visit: Payer: Self-pay

## 2020-01-04 ENCOUNTER — Emergency Department
Admission: EM | Admit: 2020-01-04 | Discharge: 2020-01-04 | Disposition: A | Discharge status: Home / Self Care | Payer: PPO | Attending: Emergency Medicine | Admitting: Emergency Medicine

## 2020-01-04 DIAGNOSIS — M25421 Effusion, right elbow: Secondary | ICD-10-CM | POA: Diagnosis not present

## 2020-01-04 DIAGNOSIS — S42411A Displaced simple supracondylar fracture without intercondylar fracture of right humerus, initial encounter for closed fracture: Secondary | ICD-10-CM | POA: Diagnosis not present

## 2020-01-04 DIAGNOSIS — E11319 Type 2 diabetes mellitus with unspecified diabetic retinopathy without macular edema: Secondary | ICD-10-CM | POA: Insufficient documentation

## 2020-01-04 DIAGNOSIS — E1122 Type 2 diabetes mellitus with diabetic chronic kidney disease: Secondary | ICD-10-CM | POA: Diagnosis not present

## 2020-01-04 DIAGNOSIS — E039 Hypothyroidism, unspecified: Secondary | ICD-10-CM | POA: Insufficient documentation

## 2020-01-04 DIAGNOSIS — Z79899 Other long term (current) drug therapy: Secondary | ICD-10-CM | POA: Diagnosis not present

## 2020-01-04 DIAGNOSIS — N184 Chronic kidney disease, stage 4 (severe): Secondary | ICD-10-CM | POA: Diagnosis not present

## 2020-01-04 DIAGNOSIS — S59901A Unspecified injury of right elbow, initial encounter: Secondary | ICD-10-CM | POA: Diagnosis present

## 2020-01-04 DIAGNOSIS — W01198A Fall on same level from slipping, tripping and stumbling with subsequent striking against other object, initial encounter: Secondary | ICD-10-CM | POA: Insufficient documentation

## 2020-01-04 DIAGNOSIS — Y92002 Bathroom of unspecified non-institutional (private) residence single-family (private) house as the place of occurrence of the external cause: Secondary | ICD-10-CM | POA: Diagnosis not present

## 2020-01-04 DIAGNOSIS — I129 Hypertensive chronic kidney disease with stage 1 through stage 4 chronic kidney disease, or unspecified chronic kidney disease: Secondary | ICD-10-CM | POA: Insufficient documentation

## 2020-01-04 MED ORDER — HYDROCODONE-ACETAMINOPHEN 5-325 MG PO TABS
1.0000 | ORAL_TABLET | ORAL | Status: AC
Start: 2020-01-04 — End: 2020-01-04
  Administered 2020-01-04: 1 via ORAL
  Filled 2020-01-04: qty 1

## 2020-01-04 MED ORDER — HYDROCODONE-ACETAMINOPHEN 5-325 MG PO TABS: 1.0000 | ORAL_TABLET | Freq: Four times a day (QID) | ORAL | 0 refills | Status: DC | PRN

## 2020-01-04 NOTE — Discharge Instructions (Signed)
Please keep splint on at all times, keep clean and dry.  Use sling as needed for comfort.  Call orthopedic office tomorrow morning to schedule follow-up appointment.  Please use Norco as needed for moderate to severe pain and Tylenol as needed for mild pain.  Avoid placing any weight in the right upper extremity.  Return to the ER for any increased pain swelling numbness or tingling

## 2020-01-04 NOTE — ED Triage Notes (Signed)
PT to ED via POV from home. PT was rushing to the restroom and tripped and fell into her dresser. Injured her right elbow and mild abrasions noted to right cheek but pt c/o elbow. No LOC, no anticoags.

## 2020-01-04 NOTE — ED Provider Notes (Signed)
[facility] EMERGENCY DEPARTMENT Provider Note   CSN: [id number] Arrival date & time: 01/04/20  0867     History Chief Complaint  Patient presents with  . Fall    Lisa Mills is a 86 y.o. female presents to the emergency department for evaluation of a fall that occurred today just prior to arrival.  Patient states she was going to the bathroom when she tripped and fell into her dresser.  She denies falling to the floor.  She states she hit her right elbow up against the dresser and developed pain and swelling.  She denies hitting her head, losing consciousness.  No neck pain back pain or lower extremity discomfort.  She denies any shoulder or wrist discomfort.  No numbness or tingling throughout the right upper extremity.  She has not had a medications for pain.  She lives at home, she drives.  She denies any difficulty with ambulation post fall today. HPI     Past Medical History:  Diagnosis Date  . Dermatophytosis of nail   . GERD (gastroesophageal reflux disease)   . History of shingles   . Hypertension   . Keratoderma     Patient Active Problem List   Diagnosis Date Noted  . Psychophysiological insomnia 10/06/2019  . Nonintractable headache 10/06/2019  . Urinary incontinence 10/06/2019  . Internal and external bleeding hemorrhoids 08/15/2019  . Sacral pain   . Radicular pain of sacrum 07/11/2019  . Sacral fracture, closed ([place]) 07/04/2019  . Fall 07/04/2019  . Bilateral pubic rami fractures, sequela 07/04/2019  . Background diabetic retinopathy ([place]) 01/23/2019  . Schatzki's ring 03/04/2017  . Post herpetic neuralgia 10/06/2016  . Hx of colonic polyps 09/22/2016  . Osteopenia 09/02/2016  . Anxiety associated with depression 08/25/2016  . Trapezius muscle spasm 05/25/2016  . Intermittent left lower quadrant abdominal pain 03/16/2016  . Anemia in chronic kidney disease (CKD) 02/26/2016  . Type 2 diabetes mellitus with stage 4 chronic kidney  disease ([place]) 02/25/2016  . Hypothyroidism 02/25/2016  . Constipation 02/25/2016  . GERD (gastroesophageal reflux disease) 02/25/2016  . Hyperlipidemia associated with type 2 diabetes mellitus ([place]) 02/25/2016  . DJD (degenerative joint disease) of cervical spine 02/25/2016  . Osteoarthritis of multiple joints 02/25/2016  . Hiatal hernia 02/25/2016  . CKD stage 3 due to type 2 diabetes mellitus ([place]) 02/25/2016  . Chronic kidney disease, unspecified 09/05/2013  . Benign hypertension with CKD (chronic kidney disease) stage III ([place]) 09/05/2013  . CMC arthritis, thumb, degenerative 06/15/2013  . Onychomycosis due to dermatophyte 05/25/2013  . Acquired keratoderma 05/25/2013    Past Surgical History:  Procedure Laterality Date  . BREAST BIOPSY Right 09/30/2017   Affirm bx-calcs ( X clip),benign  . BREAST EXCISIONAL BIOPSY Right    neg  . cataracts    . COLONOSCOPY W/ POLYPECTOMY    . COLONOSCOPY WITH PROPOFOL N/A 02/10/2017   Procedure: COLONOSCOPY WITH PROPOFOL;  Surgeon: Manya Silvas, MD;  Location: [facility] ENDOSCOPY;  Service: Endoscopy;  Laterality: N/A;  . ESOPHAGOGASTRODUODENOSCOPY (EGD) WITH PROPOFOL N/A 02/10/2017   Procedure: ESOPHAGOGASTRODUODENOSCOPY (EGD) WITH PROPOFOL;  Surgeon: Manya Silvas, MD;  Location: [facility] ENDOSCOPY;  Service: Endoscopy;  Laterality: N/A;  . EYE SURGERY     bilateral cataracts  . HAND SURGERY    . KIDNEY STONE SURGERY    . SACROPLASTY N/A 07/06/2019   Procedure: SACROPLASTY;  Surgeon: Hessie Knows, MD;  Location: [facility] ORS;  Service: Orthopedics;  Laterality: N/A;  . TONSILECTOMY, ADENOIDECTOMY, BILATERAL  MYRINGOTOMY AND TUBES    . TONSILLECTOMY    . TRIGGER FINGER RELEASE     Left ring finger  . tubercular peritonitis       OB History    Gravida  2   Para  2   Term      Preterm      AB      Living        SAB      IAB      Ectopic      Multiple      Live Births              Family History  Adopted: Yes   Problem Relation Age of Onset  . Breast cancer Neg Hx     Social History   Tobacco Use  . Smoking status: Never Smoker  . Smokeless tobacco: Never Used  Vaping Use  . Vaping Use: Never used  Substance Use Topics  . Alcohol use: Not Currently  . Drug use: No    Home Medications Prior to Admission medications   Medication Sig Start Date End Date Taking? Authorizing Provider  Blood Glucose Monitoring Suppl ([place]) w/Device KIT Use glucometer to check blood sugar daily. 02/25/16   Karamalegos, Devonne Doughty, DO  Camphor-Menthol-Methyl Sal (SALONPAS) 3.01-25-08 % PTCH Apply 1 patch topically 2 (two) times daily. Patient not taking: Reported on 11/28/2019 10/05/19   Verl Bangs, FNP  cholecalciferol (VITAMIN D) 1000 units tablet Take 2,000 Units by mouth daily.     [provider]  escitalopram (LEXAPRO) 5 MG tablet TAKE 1 TABLET BY MOUTH ONCE DAILY WITH FOOD 12/05/19   Parks Ranger, Devonne Doughty, DO  feeding supplement, ENSURE ENLIVE, (ENSURE ENLIVE) LIQD Take 237 mLs by mouth 3 (three) times daily. 07/13/19   Mikhail, Velta Addison, DO  ferrous sulfate 325 (65 FE) MG tablet Take 325 mg by mouth daily.     [provider]  glucose blood (ONE TOUCH ULTRA TEST) test strip CHECK BLOOD SUGAR UP TO 2 TIMES A DAY. 03/07/18   Karamalegos, Devonne Doughty, DO  hydrocortisone (ANUSOL-HC) 2.5 % rectal cream USE 1 APPLICATION RECTALLY TWICE DAILY AS DIRECTED 09/11/19   Karamalegos, Devonne Doughty, DO  hydrocortisone (ANUSOL-HC) 25 MG suppository Place 1 suppository (25 mg total) rectally 2 (two) times daily. For 7 days for hemorrhoid 08/15/19   Olin Hauser, DO  hydrocortisone 2.5 % cream Place rectally.  08/15/19   [provider]  levothyroxine (SYNTHROID) 25 MCG tablet TAKE 1 TABLET BY MOUTH ONCE DAILY ON AN EMPTY STOMACH. WAIT 30 MINUTES BEFORE TAKING OTHER MEDS. 12/02/19   Karamalegos, Devonne Doughty, DO  lisinopril (ZESTRIL) 5 MG tablet Take 5 mg by mouth daily.  08/01/19   [provider]  nitrofurantoin (MACRODANTIN) 100 MG capsule Take 1 capsule (100 mg total) by mouth daily. 11/27/19   Bjorn Loser, MD  omeprazole (PRILOSEC) 40 MG capsule Take 1 tablet 30 mins before breakfast and 1 tablet 30 mins before dinner. Take on an empty stomach, without other medications. 10/02/19   [provider]  Jonetta Speak LANCETS 03K MISC 1 each by Does not apply route 3 (three) times daily. Dx:E11.9 04/14/16   Karamalegos, Devonne Doughty, DO  polyethylene glycol (MIRALAX / GLYCOLAX) 17 g packet Take 17 g by mouth daily as needed for moderate constipation or severe constipation. 07/24/19   Swayze, Ava, DO  senna (SENOKOT) 8.6 MG TABS tablet Take 1 tablet (8.6 mg total) by mouth 2 (  two) times daily. 07/07/19   Lorella Nimrod, MD  simvastatin (ZOCOR) 40 MG tablet TAKE 1 TABLET BY MOUTH AT BEDTIME Patient taking differently: Take 40 mg by mouth at bedtime.  01/19/19   Karamalegos, Devonne Doughty, DO  traZODone (DESYREL) 50 MG tablet Take 0.5-1 tablets (25-50 mg total) by mouth at bedtime as needed for sleep. 10/05/19   Malfi, Lupita Raider, FNP    Allergies    Aspirin, Conray [iothalamate], Dye fdc red [red dye], Sulfa antibiotics, and Prednisone  Review of Systems   Review of Systems  Respiratory: Negative for shortness of breath.   Cardiovascular: Negative for chest pain.  Gastrointestinal: Negative for nausea and vomiting.  Musculoskeletal: Positive for arthralgias and joint swelling. Negative for gait problem and neck pain.  Skin: Negative for rash and wound.  Neurological: Negative for dizziness, light-headedness and headaches.    Physical Exam Updated Vital Signs BP (!) 154/60 (BP Location: Left Arm)   Pulse 66   Temp 98.2 F (36.8 C) (Oral)   Resp 18   Ht 4' 11.5" (1.511 m)   Wt 49.9 kg   SpO2 99%   BMI 21.85 kg/m   Physical Exam Constitutional:      Appearance: She is well-developed and well-nourished.  HENT:     Head: Normocephalic and  atraumatic.  Eyes:     Conjunctiva/sclera: Conjunctivae normal.  Cardiovascular:     Rate and Rhythm: Normal rate.  Pulmonary:     Effort: Pulmonary effort is normal. No respiratory distress.  Musculoskeletal:        General: Normal range of motion.     Cervical back: Normal range of motion.     Comments: No tenderness along the cervical thoracic or lumbar spinous process.  No proximal humeral tenderness.  She is nontender throughout the forearm wrist or digits.  She does have swelling and deformity to the right elbow with tenderness to palpation.  No pain with bilateral hip internal or external rotation.  No tenderness throughout the lower extremities.  Skin:    General: Skin is warm.     Findings: No rash.  Neurological:     Mental Status: She is alert and oriented to person, place, and time.  Psychiatric:        Mood and Affect: Mood and affect normal.        Behavior: Behavior normal.        Thought Content: Thought content normal.     ED Results / Procedures / Treatments   Labs (all labs ordered are listed, but only abnormal results are displayed) Labs Reviewed - No data to display  EKG None  Radiology DG Elbow Complete Right  Result Date: 01/04/2020 CLINICAL DATA:  Fall, right elbow pain EXAM: RIGHT ELBOW - COMPLETE 3+ VIEW COMPARISON:  None. FINDINGS: Four view radiograph right elbow demonstrates an acute right supracondylar fracture with 1 cm ulnar displacement, 1 cm override, and roughly 50 degrees anterior angulation of the distal fracture fragment. Superimposed mild ulnar humeral degenerative arthritis noted. Large right elbow effusion present. IMPRESSION: Acute displaced, angulated right supracondylar fracture. Electronically Signed   By: Fidela Salisbury MD   On: 01/04/2020 19:53    Procedures .Ortho Injury Treatment  Date/Time: 01/04/2020 9:02 PM Performed by: Duanne Guess, PA-C Authorized by: Duanne Guess, PA-C   Consent:    Consent obtained:   Verbal   Consent given by:  Patient   Risks discussed:  FractureInjury location: elbow Location details: right elbow Injury type: fracture Pre-procedure  neurovascular assessment: neurovascularly intact Pre-procedure distal perfusion: normal Pre-procedure neurological function: normal Pre-procedure range of motion: reduced Immobilization: splint Supplies used: cotton padding,  elastic bandage and Ortho-Glass Post-procedure neurovascular assessment: post-procedure neurovascularly intact Post-procedure distal perfusion: normal Post-procedure neurological function: normal Post-procedure range of motion: unchanged    (including critical care time)  Medications Ordered in ED Medications  HYDROcodone-acetaminophen (NORCO/VICODIN) 5-325 MG per tablet 1 tablet (has no administration in time range)    ED Course  I have reviewed the triage vital signs and the nursing notes.  Pertinent labs & imaging results that were available during my care of the patient were reviewed by me and considered in my medical decision making (see chart for details).    MDM Rules/Calculators/A&P                          86 year old female with fall onto her dresser, only complains of elbow pain. X-ray showed displaced supracondylar fracture. She is neurovascular intact in right upper extremity. Pain is well controlled. No other injury to her body. She is placed into a posterior splint and sling and will call orthopedics tomorrow to schedule follow-up appointment. Final Clinical Impression(s) / ED Diagnoses Final diagnoses:  Closed supracondylar fracture of right humerus, initial encounter    Rx / DC Orders ED Discharge Orders    None       Renata Caprice 01/04/20 2144    Arta Silence, MD 01/04/20 2236

## 2020-01-05 ENCOUNTER — Telehealth: Payer: Self-pay

## 2020-01-05 NOTE — Telephone Encounter (Signed)
Volunteer called patient on behalf of Palliative Care and did not get a answer from patient/family. ° °

## 2020-01-09 DIAGNOSIS — M6289 Other specified disorders of muscle: Secondary | ICD-10-CM | POA: Diagnosis not present

## 2020-01-09 DIAGNOSIS — K5909 Other constipation: Secondary | ICD-10-CM | POA: Diagnosis not present

## 2020-01-10 ENCOUNTER — Telehealth (INDEPENDENT_AMBULATORY_CARE_PROVIDER_SITE_OTHER): Payer: PPO | Admitting: Family Medicine

## 2020-01-10 ENCOUNTER — Other Ambulatory Visit: Payer: Self-pay

## 2020-01-10 ENCOUNTER — Telehealth: Payer: Self-pay | Admitting: Family Medicine

## 2020-01-10 ENCOUNTER — Encounter: Payer: Self-pay | Admitting: Family Medicine

## 2020-01-10 DIAGNOSIS — E1122 Type 2 diabetes mellitus with diabetic chronic kidney disease: Secondary | ICD-10-CM | POA: Diagnosis not present

## 2020-01-10 DIAGNOSIS — N184 Chronic kidney disease, stage 4 (severe): Secondary | ICD-10-CM | POA: Diagnosis not present

## 2020-01-10 DIAGNOSIS — F5104 Psychophysiologic insomnia: Secondary | ICD-10-CM | POA: Diagnosis not present

## 2020-01-10 DIAGNOSIS — R296 Repeated falls: Secondary | ICD-10-CM | POA: Diagnosis not present

## 2020-01-10 DIAGNOSIS — S42401A Unspecified fracture of lower end of right humerus, initial encounter for closed fracture: Secondary | ICD-10-CM

## 2020-01-10 DIAGNOSIS — S42411A Displaced simple supracondylar fracture without intercondylar fracture of right humerus, initial encounter for closed fracture: Secondary | ICD-10-CM | POA: Diagnosis not present

## 2020-01-10 MED ORDER — ONETOUCH DELICA LANCETS 33G MISC: 1.0000 | Freq: Three times a day (TID) | 12 refills | Status: DC

## 2020-01-10 MED ORDER — GLUCOSE BLOOD VI STRP
ORAL_STRIP | 11 refills | Status: DC
Start: 2020-01-10 — End: 2021-03-14

## 2020-01-10 NOTE — Progress Notes (Signed)
Virtual Visit via Telephone The purpose of this virtual visit is to provide medical care while limiting exposure to the novel coronavirus (COVID19) for both patient and office staff.  Consent was obtained for phone visit:  Yes.   Answered questions that patient had about telehealth interaction:  Yes.   I discussed the limitations, risks, security and privacy concerns of performing an evaluation and management service by telephone. I also discussed with the patient that there may be a patient responsible charge related to this service. The patient expressed understanding and agreed to proceed.  Patient Location: Home Provider Location: [facility] (Office)  Participants in virtual visit: - Patient: Lisa Mills. [name] - CMA: Frederich Cha, CMA - Provider: Dr Parks Ranger  ---------------------------------------------------------------------- Chief Complaint  Patient presents with  . Diabetes    S: Reviewed CMA documentation. I have called patient and gathered additional HPI as follows:    GERD / Indigestion She reports that she took Omeprazole 58m BID in the past and ultimately did not need it, it did cause some side effects with indigestion. She has reduced dose to 284mdaily once daily and now has done better. She admits had issue with meals and would trigger some symptoms. She has plenty of medication. [facility], just had telemedicine visit with them yesterday  Recurrent Falls History of Right Elbow Fracture Fall on 01/04/20, [place], they did x-ray and confirmed fractured elbow. She was referred to orthopedics. [facility].  Insomnia Anxiety with Mood/Depression recurrent RLS Anemia  Interval update, now much improved on sleep. She took one dose Trazodone half tab back when first symptoms started. Now does not take  anymore. Her sleep is better She is off Mirtazapine, Pramipexole, Gabapentin On escitalopram She denies any other hypoglycemia She has had reduced RLS tremors now with better sleep  Denies any fevers, chills, sweats, body ache, cough, shortness of breath, sinus pain or pressure, headache, abdominal pain, diarrhea  Past Medical History:  Diagnosis Date  . Dermatophytosis of nail   . GERD (gastroesophageal reflux disease)   . History of shingles   . Hypertension   . Keratoderma    Social History   Tobacco Use  . Smoking status: Never Smoker  . Smokeless tobacco: Never Used  Vaping Use  . Vaping Use: Never used  Substance Use Topics  . Alcohol use: Not Currently  . Drug use: No    Current Outpatient Medications:  .  cholecalciferol (VITAMIN D) 1000 units tablet, Take 2,000 Units by mouth daily. , Disp: , Rfl:  .  escitalopram (LEXAPRO) 5 MG tablet, TAKE 1 TABLET BY MOUTH ONCE DAILY WITH FOOD, Disp: 90 tablet, Rfl: 0 .  ferrous sulfate 325 (65 FE) MG tablet, Take 325 mg by mouth daily. , Disp: , Rfl:  .  hydrocortisone (ANUSOL-HC) 2.5 % rectal cream, USE 1 APPLICATION RECTALLY TWICE DAILY AS DIRECTED, Disp: 30 g, Rfl: 0 .  levothyroxine (SYNTHROID) 25 MCG tablet, TAKE 1 TABLET BY MOUTH ONCE DAILY ON AN EMPTY STOMACH. WAIT 30 MINUTES BEFORE TAKING OTHER MEDS., Disp: 90 tablet, Rfl: 0 .  lisinopril (ZESTRIL) 5 MG tablet, Take 5 mg by mouth daily., Disp: , Rfl:  .  nitrofurantoin (MACRODANTIN) 100 MG capsule, Take 1 capsule (100 mg total) by mouth daily., Disp: 30 capsule, Rfl: 11 .  omeprazole (PRILOSEC) 20 MG capsule,  Take 20 mg by mouth daily before breakfast., Disp: , Rfl:  .  polyethylene glycol (MIRALAX / GLYCOLAX) 17 g packet, Take 17 g by mouth daily as needed for moderate constipation or severe constipation., Disp: 14 each, Rfl: 0 .  simvastatin (ZOCOR) 40 MG tablet, TAKE 1 TABLET BY MOUTH AT BEDTIME (Patient taking differently: Take 40 mg by mouth at bedtime.), Disp: 90  tablet, Rfl: 3 .  traZODone (DESYREL) 50 MG tablet, Take 0.5-1 tablets (25-50 mg total) by mouth at bedtime as needed for sleep., Disp: 30 tablet, Rfl: 3 .  Blood Glucose Monitoring Suppl ([place]) w/Device KIT, Use glucometer to check blood sugar daily. (Patient not taking: Reported on 01/10/2020), Disp: 1 each, Rfl: 0 .  Camphor-Menthol-Methyl Sal (SALONPAS) 3.01-25-08 % PTCH, Apply 1 patch topically 2 (two) times daily. (Patient not taking: No sig reported), Disp: 60 patch, Rfl: 1 .  feeding supplement, ENSURE ENLIVE, (ENSURE ENLIVE) LIQD, Take 237 mLs by mouth 3 (three) times daily. (Patient not taking: Reported on 01/10/2020), Disp: , Rfl:  .  glucose blood (ONE TOUCH ULTRA TEST) test strip, CHECK BLOOD SUGAR UP TO 2 TIMES A DAY., Disp: 100 each, Rfl: 11 .  HYDROcodone-acetaminophen (NORCO) 5-325 MG tablet, Take 1 tablet by mouth every 6 (six) hours as needed for moderate pain. (Patient not taking: Reported on 01/10/2020), Disp: 20 tablet, Rfl: 0 .  OneTouch Delica Lancets 62I MISC, 1 each by Does not apply route 3 (three) times daily. Dx:E11.9, Disp: 100 each, Rfl: 12 .  senna (SENOKOT) 8.6 MG TABS tablet, Take 1 tablet (8.6 mg total) by mouth 2 (two) times daily. (Patient not taking: Reported on 01/10/2020), Disp: 120 tablet, Rfl: 0  Depression screen [facility] 2/9 11/28/2019 09/14/2019 09/07/2019  Decreased Interest 0 0 0  Down, Depressed, Hopeless 0 0 0  PHQ - 2 Score 0 0 0  Altered sleeping - 3 -  Tired, decreased energy - 1 -  Change in appetite - 2 -  Feeling bad or failure about yourself  - 0 -  Trouble concentrating - 0 -  Moving slowly or fidgety/restless - 0 -  Suicidal thoughts - 0 -  PHQ-9 Score - 6 -  Difficult doing work/chores - Somewhat difficult -  Some recent data might be hidden    GAD 7 : Generalized Anxiety Score 06/06/2018 03/02/2018 12/01/2016 10/06/2016  Nervous, Anxious, on Edge (No Data) 0 0 0  Control/stop worrying - 0 1 0  Worry too much - different  things - 0 0 0  Trouble relaxing - 0 0 0  Restless - 0 0 0  Easily annoyed or irritable - 0 0 0  Afraid - awful might happen - 0 0 0  Total GAD 7 Score - 0 1 0  Anxiety Difficulty - Not difficult at all Not difficult at all Not difficult at all    -------------------------------------------------------------------------- O: No physical exam performed due to remote telephone encounter.  Lab results reviewed.  I have personally reviewed the radiology report from 01/04/20 RIGHT Elbow X-ray.  DG Elbow Complete RightPerformed 01/04/2020 Final result  Study Result CLINICAL DATA: Fall, right elbow pain  EXAM: RIGHT ELBOW - COMPLETE 3+ VIEW  COMPARISON: None.  FINDINGS: Four view radiograph right elbow demonstrates an acute right supracondylar fracture with 1 cm ulnar displacement, 1 cm override, and roughly 50 degrees anterior angulation of the distal fracture fragment. Superimposed mild ulnar humeral degenerative arthritis noted. Large right elbow effusion present.  IMPRESSION: Acute displaced,  angulated right supracondylar fracture.   Electronically Signed By: Fidela Salisbury MD On: 01/04/2020 19:53     No results found for this or any previous visit (from the past 2160 hour(s)).  -------------------------------------------------------------------------- A&P:  Problem List Items Addressed This Visit    Type 2 diabetes mellitus with stage 4 chronic kidney disease (HCC)   Relevant Medications   glucose blood (ONE TOUCH ULTRA TEST) test strip   OneTouch Delica Lancets 54T MISC   Psychophysiological insomnia    Other Visit Diagnoses    Recurrent falls    -  Primary   Relevant Orders   AMB Referral to [facility]   Closed fracture dislocation of right elbow, initial encounter       Relevant Orders   AMB Referral to [facility]     #Right Elbow Fracture Recurrent Falls Recent injury from 01/04/20, she is doing okay with this managing  it, and has upcoming apt this afternoon in person w/ Orthopedic Dr Rudene Christians Reviewed ED visit and X-ray  She requests Life Alert system for [place] - I sent a care guides referral and they can contact her with more information.  #T2DM Controlled on last A1c Re order her DM testing supplies for One Touch glucometer Remain off medication for DM  #Insomnia Improved No longer on Trazodone, only took half dose initially  #GERD Improved, back on lower dose 74m daily, no longer BID dosing.  Orders Placed This Encounter  Procedures  . [facility]    Referral Priority:   Routine    Referral Type:   Consultation    Referral Reason:   Care Coordination    Number of Visits Requested:   1     Meds ordered this encounter  Medications  . glucose blood (ONE TOUCH ULTRA TEST) test strip    Sig: CHECK BLOOD SUGAR UP TO 2 TIMES A DAY.    Dispense:  100 each    Refill:  11  . OneTouch Delica Lancets 362BMISC    Sig: 1 each by Does not apply route 3 (three) times daily. Dx:E11.9    Dispense:  100 each    Refill:  12    Follow-up: as needed  Patient verbalizes understanding with the above medical recommendations including the limitation of remote medical advice.  Specific follow-up and call-back criteria were given for patient to follow-up or seek medical care more urgently if needed.   - Time spent in direct consultation with patient on phone: 15 minutes    ANobie Putnam D[facility] 01/10/2020, 11:00 AM

## 2020-01-10 NOTE — Telephone Encounter (Signed)
Lisa Mills 01/10/2020 Called pt regarding community resource referral received. Left message for pt to call me back, my info is [phone] please see ref notes for more details.  [place], Care Management

## 2020-01-18 ENCOUNTER — Telehealth: Payer: Self-pay | Admitting: Family Medicine

## 2020-01-18 NOTE — Telephone Encounter (Signed)
Lisa Mills 01/18/2020 Called pt regarding community resource referral received. Left message for pt to call me back, my info is [phone] please see ref notes for more details.  [place], Care Management

## 2020-01-22 ENCOUNTER — Telehealth: Payer: Self-pay | Admitting: Family Medicine

## 2020-01-22 NOTE — Telephone Encounter (Signed)
Patient is calling to ask Dr. Raliegh Ip if he has an opportunity to check into about a life alert system that would call straight to hospital that she has fallen.  Wanting to know if staff has any information on this system. Patient was given the number for life alert. But she has not called them. She wanted to use something local is possible  Please advise CB- [phone]

## 2020-01-22 NOTE — Telephone Encounter (Signed)
I have placed a Care Guides referral for patient back on 01/10/20.  Looks like they have attempted to contact her multiple times.  Here is copy of last documentation.  Conversation: [employer] (Newest Message First)  Johnston Ebbs  01/18/20 3:03 PM Note Claretta Fraise 01/18/2020 Called pt regarding community resource referral received. Left message for pt to call me back, my info is [phone] please see ref notes for more details.  [place], Care Management       Will forward this message to Claretta Fraise, Care Guide to reach the patient again.  Nobie Putnam, DO [facility] 01/22/2020, 3:59 PM

## 2020-01-24 ENCOUNTER — Telehealth: Payer: Self-pay | Admitting: Family Medicine

## 2020-01-24 NOTE — Telephone Encounter (Signed)
Lisa Mills 01/24/2020 Called pt regarding community resource referral received. My info is [phone] please see ref notes for more details. Thank you.  [place], Care Management

## 2020-01-31 DIAGNOSIS — S42411A Displaced simple supracondylar fracture without intercondylar fracture of right humerus, initial encounter for closed fracture: Secondary | ICD-10-CM | POA: Diagnosis not present

## 2020-02-01 ENCOUNTER — Ambulatory Visit: Payer: PPO | Admitting: Licensed Clinical Social Worker

## 2020-02-01 DIAGNOSIS — R296 Repeated falls: Secondary | ICD-10-CM

## 2020-02-01 DIAGNOSIS — F418 Other specified anxiety disorders: Secondary | ICD-10-CM

## 2020-02-01 DIAGNOSIS — E1122 Type 2 diabetes mellitus with diabetic chronic kidney disease: Secondary | ICD-10-CM

## 2020-02-01 DIAGNOSIS — E785 Hyperlipidemia, unspecified: Secondary | ICD-10-CM

## 2020-02-01 DIAGNOSIS — N184 Chronic kidney disease, stage 4 (severe): Secondary | ICD-10-CM

## 2020-02-01 DIAGNOSIS — S42401A Unspecified fracture of lower end of right humerus, initial encounter for closed fracture: Secondary | ICD-10-CM

## 2020-02-01 DIAGNOSIS — E1169 Type 2 diabetes mellitus with other specified complication: Secondary | ICD-10-CM

## 2020-02-01 NOTE — Chronic Care Management (AMB) (Signed)
Chronic Care Management    Clinical Social Work Note  02/01/2020 Name: Lisa Mills MRN: [id number] DOB: 27-Mar-1929  Lisa Mills is a 87 y.o. year old female who is a primary care patient of Olin Hauser, DO. The CCM team was consulted to assist the patient with chronic disease management and/or care coordination needs related to: Level of Care Concerns and Mental Health Counseling and Resources.   Engaged with patient by telephone for follow up visit in response to provider referral for social work chronic care management and care coordination services.   Consent to Services:  The patient was given the following information about Chronic Care Management services today, agreed to services, and gave verbal consent: 1. CCM service includes personalized support from designated clinical staff supervised by the primary care provider, including individualized plan of care and coordination with other care providers 2. 24/7 contact phone numbers for assistance for urgent and routine care needs. 3. Service will only be billed when office clinical staff spend 20 minutes or more in a month to coordinate care. 4. Only one practitioner may furnish and bill the service in a calendar month. 5.The patient may stop CCM services at any time (effective at the end of the month) by phone call to the office staff. 6. The patient will be responsible for cost sharing (co-pay) of up to 20% of the service fee (after annual deductible is met). Patient agreed to services and consent obtained.  Patient agreed to services and consent obtained.   Assessment: Review of patient past medical history, allergies, medications, and health status, including review of relevant consultants reports was performed today as part of a comprehensive evaluation and provision of chronic care management and care coordination services.     SDOH (Social Determinants of Health) assessments and interventions performed:    Advanced  Directives Status: See Care Plan for related entries.  CCM Care Plan  Allergies  Allergen Reactions  . Aspirin Other (See Comments)    Burns stomach  . Conray [Iothalamate] Hives    IV dye conray-400  . Dye Fdc Red [Red Dye] Hives  . Sulfa Antibiotics     Unknown Reaction, not used in years  . Prednisone     Indigestion     Outpatient Encounter Medications as of 02/01/2020  Medication Sig  . Blood Glucose Monitoring Suppl ([place]) w/Device KIT Use glucometer to check blood sugar daily. (Patient not taking: Reported on 01/10/2020)  . Camphor-Menthol-Methyl Sal (SALONPAS) 3.01-25-08 % PTCH Apply 1 patch topically 2 (two) times daily. (Patient not taking: No sig reported)  . cholecalciferol (VITAMIN D) 1000 units tablet Take 2,000 Units by mouth daily.   Marland Kitchen escitalopram (LEXAPRO) 5 MG tablet TAKE 1 TABLET BY MOUTH ONCE DAILY WITH FOOD  . feeding supplement, ENSURE ENLIVE, (ENSURE ENLIVE) LIQD Take 237 mLs by mouth 3 (three) times daily. (Patient not taking: Reported on 01/10/2020)  . ferrous sulfate 325 (65 FE) MG tablet Take 325 mg by mouth daily.   Marland Kitchen glucose blood (ONE TOUCH ULTRA TEST) test strip CHECK BLOOD SUGAR UP TO 2 TIMES A DAY.  Marland Kitchen HYDROcodone-acetaminophen (NORCO) 5-325 MG tablet Take 1 tablet by mouth every 6 (six) hours as needed for moderate pain. (Patient not taking: Reported on 01/10/2020)  . hydrocortisone (ANUSOL-HC) 2.5 % rectal cream USE 1 APPLICATION RECTALLY TWICE DAILY AS DIRECTED  . levothyroxine (SYNTHROID) 25 MCG tablet TAKE 1 TABLET BY MOUTH ONCE DAILY ON AN EMPTY STOMACH. WAIT 30 MINUTES BEFORE  TAKING OTHER MEDS.  Marland Kitchen lisinopril (ZESTRIL) 5 MG tablet Take 5 mg by mouth daily.  . nitrofurantoin (MACRODANTIN) 100 MG capsule Take 1 capsule (100 mg total) by mouth daily.  Marland Kitchen omeprazole (PRILOSEC) 20 MG capsule Take 20 mg by mouth daily before breakfast.  . OneTouch Delica Lancets 58I MISC 1 each by Does not apply route 3 (three) times daily. Dx:E11.9  .  polyethylene glycol (MIRALAX / GLYCOLAX) 17 g packet Take 17 g by mouth daily as needed for moderate constipation or severe constipation.  . senna (SENOKOT) 8.6 MG TABS tablet Take 1 tablet (8.6 mg total) by mouth 2 (two) times daily. (Patient not taking: Reported on 01/10/2020)  . simvastatin (ZOCOR) 40 MG tablet TAKE 1 TABLET BY MOUTH AT BEDTIME (Patient taking differently: Take 40 mg by mouth at bedtime.)  . traZODone (DESYREL) 50 MG tablet Take 0.5-1 tablets (25-50 mg total) by mouth at bedtime as needed for sleep.   No facility-administered encounter medications on file as of 02/01/2020.    Patient Active Problem List   Diagnosis Date Noted  . Psychophysiological insomnia 10/06/2019  . Nonintractable headache 10/06/2019  . Urinary incontinence 10/06/2019  . Internal and external bleeding hemorrhoids 08/15/2019  . Sacral pain   . Radicular pain of sacrum 07/11/2019  . Sacral fracture, closed ([place]) 07/04/2019  . Fall 07/04/2019  . Bilateral pubic rami fractures, sequela 07/04/2019  . Background diabetic retinopathy ([place]) 01/23/2019  . Schatzki's ring 03/04/2017  . Post herpetic neuralgia 10/06/2016  . Hx of colonic polyps 09/22/2016  . Osteopenia 09/02/2016  . Anxiety associated with depression 08/25/2016  . Trapezius muscle spasm 05/25/2016  . Intermittent left lower quadrant abdominal pain 03/16/2016  . Anemia in chronic kidney disease (CKD) 02/26/2016  . Type 2 diabetes mellitus with stage 4 chronic kidney disease ([place]) 02/25/2016  . Hypothyroidism 02/25/2016  . Constipation 02/25/2016  . GERD (gastroesophageal reflux disease) 02/25/2016  . Hyperlipidemia associated with type 2 diabetes mellitus ([place]) 02/25/2016  . DJD (degenerative joint disease) of cervical spine 02/25/2016  . Osteoarthritis of multiple joints 02/25/2016  . Hiatal hernia 02/25/2016  . CKD stage 3 due to type 2 diabetes mellitus ([place]) 02/25/2016  . Chronic kidney disease, unspecified 09/05/2013  . Benign  hypertension with CKD (chronic kidney disease) stage III ([place]) 09/05/2013  . CMC arthritis, thumb, degenerative 06/15/2013  . Onychomycosis due to dermatophyte 05/25/2013  . Acquired keratoderma 05/25/2013    Conditions to be addressed/monitored: Anxiety and Depression; Limited social support, Mental Health Concerns , Social Isolation and Limited access to caregiver  Care Plan : General Social Work (Adult)  Updates made by Greg Cutter, LCSW since 02/01/2020 12:00 AM    Problem: Coping Skills (General Plan of Care)     Goal: Coping Skills Enhanced   Start Date: 02/01/2020  Priority: Medium  Note:   Evidence-based guidance:   Acknowledge, normalize and validate difficulty of making life-long lifestyle changes.   Identify current effective and ineffective coping strategies.   Encourage patient and caregiver participation in care to increase self-esteem, confidence and feelings of control.   Consider alternative and complementary therapy approaches such as meditation, mindfulness or yoga.   Encourage participation in cognitive behavioral therapy to foster a positive identity, increase self-awareness, as well as bolster self-esteem, confidence and self-efficacy.   Discuss spirituality; be present as concerns are identified; encourage journaling, prayer, worship services, meditation or pastoral counseling.   Encourage participation in pleasurable group activities such as hobbies, singing, sports or volunteering).  Encourage the use of mindfulness; refer for training or intensive intervention.   Consider the use of meditative movement therapy such as tai chi, yoga or qigong.   Promote a regular daily exercise program based on tolerance, ability and patient choice to support positive thinking about disease or aging.   Notes:   Timeframe:  Long-Range Goal Priority:  Medium Start Date:  02/01/20                         Expected End Date:  03/31/20                     Follow Up  Date - 90 days from 02/01/20   - begin personal counseling - call and visit an old friend - check out volunteer opportunities - join a support group - laugh; watch a funny movie or comedian - learn and use visualization or guided imagery - perform a random act of kindness - practice relaxation or meditation daily - start or continue a personal journal - talk about feelings with a friend, family or spiritual advisor - practice positive thinking and self-talk    Why is this important?    When you are stressed, down or upset, your body reacts too.   For example, your blood pressure may get higher; you may have a headache or stomachache.   When your emotions get the best of you, your body's ability to fight off cold and flu gets weak.   These steps will help you manage your emotions.    - begin a notebook of services in my neighborhood or community - call 211 when I need some help - follow-up on any referrals for help I am given - think ahead to make sure my need does not become an emergency - make a note about what I need to have by the phone or take with me, like an identification card or social security number have a back-up plan - have a back-up plan - make a list of family or friends that I can call -consider hiring in home support since having to discontinue services with last aide    Why is this important?   Knowing how and where to find help for yourself or family in your neighborhood and community is an important skill.  You will want to take some steps to learn how.    Notes: Son comes daily for several hours to check on patient.   - check out options for in-home help, long-term care or hospice - complete a living will - discuss my treatment options with the doctor or nurse - do one enjoyable thing every day - do something different, like talking to a new person or going to a new place, every day - learn something new by asking, reading and searching the Internet  every day - make an audio or video recording for my loved ones - make shared treatment decisions with doctor - meditate daily - name a health care proxy (decision maker) - share memories using a picture album or scrapbook with my loved ones - spend time with a child every day, borrow one if I have to - spend time outdoors at least 3 times a week - strengthen or fix relationships with loved ones  -maintain healthy eating and drinking. Patient continues to lose weight.  -take cane and phone with her everywhere at all times -continue healthy socialization    Why is this important?  Having a long-term illness can be scary.  It can also be stressful for you and your caregiver.  These steps may help.    Current barriers:  Patient unable to consistently perform activities of daily living and needs additional assistance and support in order to meet this unmet need . Limited social support, ADL IADL limitations, Mental Health Concerns , Social Isolation, and Limited access to caregiver . Lacks social connections  Clinical Goals: Over the next 120 days, patient will gain additional education on in home support resources within her area  Over the next 120 days, patient will work with SW to address concerns related to anxiety management Interventions : . Assessed needs, level of care concerns, basic eligibility and provided education on available PCS resources. Patient is still in need of in home assistance since losing her last aide. Patient declines wanting any type of LTC placement.  . Reviewed community support options ( CAP, private pay, PACE program) . 1:1 collaboration with PCP regarding development and update of comprehensive plan of care as evidenced by provider attestation and co-signature  . Provided emotional support regarding patient's recent fall which led to a broken elbow. Patient has an upcoming surgery next week to repair this.  . Patient interviewed and appropriate assessments  performed . Discussed plans with patient for ongoing care management follow up and provided patient with direct contact information for care management team . Assisted patient/caregiver with obtaining information about health plan benefits . Provided education and assistance to client regarding Advanced Directives. . Provided education to patient/caregiver regarding level of care options. . Provided education to patient/caregiver about Hospice and/or Palliative Care services . Other interventions provided: Motivational Interviewing, Solution-Focused Strategies, and Brief CBT . Collaboration with PCP regarding development and update of comprehensive plan of care as evidenced by provider attestation and co-signature . Inter-disciplinary care team collaboration (see longitudinal plan of care) . Collaborated with appropriate clinical care team members regarding patient needs    Task: Support Psychosocial Response to Risk or Actual Health Condition   Note:   Care Management Activities:    - active listening utilized - counseling provided - current coping strategies identified - decision-making supported - healthy lifestyle promoted - journaling promoted - meditative movement therapy encouraged - mindfulness encouraged - participation in counseling encouraged - problem-solving facilitated - relaxation techniques promoted - self-reflection promoted - spiritual activities promoted - verbalization of feelings encouraged    Notes:       Follow Up Plan: SW will follow up with patient by phone over the next quarter      Eula Fried, [place], MSW, [email or web address] Phone: [phone]

## 2020-02-05 ENCOUNTER — Telehealth: Payer: PPO

## 2020-02-06 ENCOUNTER — Telehealth: Payer: Self-pay

## 2020-02-06 NOTE — Telephone Encounter (Signed)
11AM: Palliative care SW outreached patient/family for monthly  Visit.  Call unsuccessful. Home phone has busy signal. Cell phone goes straight to VM, SW unable to LVM.  SW outreached other contact number and was told by lady that answered the phone that she will try to outreach patient as well and have her call SW back.    Will continue to try and outreach patient to schedule in home visit and offer palliative care support.

## 2020-02-08 ENCOUNTER — Telehealth: Payer: Self-pay

## 2020-02-08 NOTE — Telephone Encounter (Signed)
Volunteer called patient on behalf of Palliative Care. Patient is doing well at this time.  

## 2020-02-09 ENCOUNTER — Telehealth: Payer: Self-pay

## 2020-02-09 NOTE — Telephone Encounter (Signed)
920 am.  Phone call made to patient to schedule and in-person visit.  Patient states she recently had a fall and will have surgery on her elbow next Tuesday.  If all goes well with the procedure, she will return home the same day.  Patient states her son is planning on staying with her that night.  She is requesting a visit have this procedure.  Visit is scheduled for next Friday at 11 am.

## 2020-02-12 ENCOUNTER — Other Ambulatory Visit: Payer: Self-pay

## 2020-02-12 ENCOUNTER — Encounter (HOSPITAL_COMMUNITY): Payer: Self-pay | Admitting: Orthopedic Surgery

## 2020-02-12 DIAGNOSIS — S4401XA Injury of ulnar nerve at upper arm level, right arm, initial encounter: Secondary | ICD-10-CM | POA: Diagnosis not present

## 2020-02-12 DIAGNOSIS — S42411A Displaced simple supracondylar fracture without intercondylar fracture of right humerus, initial encounter for closed fracture: Secondary | ICD-10-CM | POA: Diagnosis not present

## 2020-02-12 DIAGNOSIS — M25621 Stiffness of right elbow, not elsewhere classified: Secondary | ICD-10-CM | POA: Diagnosis not present

## 2020-02-12 NOTE — Progress Notes (Signed)
PCP - Nobie Putnam, DO Cardiologist - n/a  Chest x-ray - n/a EKG - DOS 02/13/20 Stress Test - 2014 Normal [facility] ECHO - n/a Cardiac Cath - n/a  Fasting Blood Sugar - unknown Checks Blood Sugar 0 times a day DM 2 - diet controlled, no meds  ERAS: Clears til 8 am DOS, no drink.  Anesthesia review: Yes  STOP now taking any Aspirin (unless otherwise instructed by your surgeon), Aleve, Naproxen, Ibuprofen, Motrin, Advil, Goody's, BC's, all herbal medications, fish oil, and all vitamins.   Coronavirus Screening Covid test is scheduled on DOS Do you have any of the following symptoms:  Cough yes/no: No Fever (>100.57F)  yes/no: No Runny nose yes/no: No Sore throat yes/no: No Difficulty breathing/shortness of breath  yes/no: No  Have you traveled in the last 14 days and where? yes/no: No  Patient verbalized understanding of instructions that were given via phone.

## 2020-02-12 NOTE — H&P (Signed)
Orthopaedic Trauma Service (OTS) H&P  Patient ID: Lisa Mills MRN: [id number] DOB/AGE: [id number] 85 y.o.    HPI: Lisa Mills is an 87 y.o. RHD female sustained a ground-level fall resulting in a fracture to her right elbow on 01/04/2020.  She was seen at an outside facility and was not referred to our office until 01/31/2020.  She has been in a splint and sling since the time of her injury denies any numbness or tingling in her digits or any other injuries.  Pain is dull and aching is worse with activity and motion is alleviated with rest and anti-inflammatories.  No other complaints or concerns noted.  She does have a history of a sacral plasty back in June 2021 I did have some mild complications after this but is doing better.  Treatment options discussed with the patient including nonoperative versus operative treatment.  She wished to proceed with surgical intervention to address her right supracondylar distal humerus fracture.  Patient does have a history of CKD, diabetes, GERD, hypothyroidism amongst other medical issues  Past Medical History:  Diagnosis Date  . Anemia   . Anxiety   . Chronic kidney disease    stage 3-4  . Constipation   . Depression   . Dermatophytosis of nail   . Diabetes mellitus without complication ([place])    Diet controlled, lost weight, no meds  . GERD (gastroesophageal reflux disease)   . Hemorrhoids   . History of shingles   . HLD (hyperlipidemia)   . Hypertension   . Hypothyroidism   . Keratoderma   . Restless legs syndrome (RLS)    hx    Past Surgical History:  Procedure Laterality Date  . BREAST BIOPSY Right 09/30/2017   Affirm bx-calcs ( X clip),benign  . BREAST EXCISIONAL BIOPSY Right    neg  . cataracts    . COLONOSCOPY W/ POLYPECTOMY    . COLONOSCOPY WITH PROPOFOL N/A 02/10/2017   Procedure: COLONOSCOPY WITH PROPOFOL;  Surgeon: Manya Silvas, MD;  Location: [facility] ENDOSCOPY;  Service: Endoscopy;  Laterality: N/A;   . ESOPHAGOGASTRODUODENOSCOPY (EGD) WITH PROPOFOL N/A 02/10/2017   Procedure: ESOPHAGOGASTRODUODENOSCOPY (EGD) WITH PROPOFOL;  Surgeon: Manya Silvas, MD;  Location: [facility] ENDOSCOPY;  Service: Endoscopy;  Laterality: N/A;  . EYE SURGERY     bilateral cataracts  . HAND SURGERY    . KIDNEY STONE SURGERY    . SACROPLASTY N/A 07/06/2019   Procedure: SACROPLASTY;  Surgeon: Hessie Knows, MD;  Location: [facility] ORS;  Service: Orthopedics;  Laterality: N/A;  . TONSILECTOMY, ADENOIDECTOMY, BILATERAL MYRINGOTOMY AND TUBES    . TONSILLECTOMY    . TRIGGER FINGER RELEASE     Left ring finger  . tubercular peritonitis      Family History  Adopted: Yes  Problem Relation Age of Onset  . Breast cancer Neg Hx     Social History:  reports that she has never smoked. She has never used smokeless tobacco. She reports previous alcohol use. She reports that she does not use drugs.  Allergies:  Allergies  Allergen Reactions  . Aspirin Other (See Comments)    Burns stomach  . Conray [Iothalamate] Hives    IV dye conray-400  . Dye Fdc Red [Red Dye] Hives  . Sulfa Antibiotics     Unknown Reaction, not used in years  . Prednisone     Indigestion     Medications: I have reviewed the patient's current medications. Current Meds  Medication  Sig  . Cholecalciferol (VITAMIN D) 50 MCG (2000 UT) CAPS Take 2,000 Units by mouth daily.   Marland Kitchen escitalopram (LEXAPRO) 5 MG tablet TAKE 1 TABLET BY MOUTH ONCE DAILY WITH FOOD (Patient taking differently: Take 5 mg by mouth daily.)  . ferrous sulfate 325 (65 FE) MG tablet Take 325 mg by mouth daily.   Marland Kitchen glycerin adult 2 g suppository Place 1 suppository rectally as needed for constipation.  Marland Kitchen Histamine Dihydrochloride ([place]STR[place]IAN DREAM ARTHRITIS) 0.025 % CREA Apply 1 application topically daily as needed (Pain).  Marland Kitchen HYDROcodone-acetaminophen (NORCO) 5-325 MG tablet Take 1 tablet by mouth every 6 (six) hours as needed for moderate pain.  . hydrocortisone (ANUSOL-HC) 2.5 %  rectal cream USE 1 APPLICATION RECTALLY TWICE DAILY AS DIRECTED (Patient taking differently: Place 1 application rectally daily as needed for hemorrhoids or anal itching.)  . levothyroxine (SYNTHROID) 25 MCG tablet TAKE 1 TABLET BY MOUTH ONCE DAILY ON AN EMPTY STOMACH. WAIT 30 MINUTES BEFORE TAKING OTHER MEDS. (Patient taking differently: Take 25 mcg by mouth daily before breakfast. AN EMPTY STOMACH. WAIT 30 MINUTES BEFORE TAKING OTHER MEDS.)  . lisinopril (ZESTRIL) 5 MG tablet Take 5 mg by mouth daily.  . nitrofurantoin (MACRODANTIN) 100 MG capsule Take 1 capsule (100 mg total) by mouth daily.  Marland Kitchen omeprazole (PRILOSEC) 20 MG capsule Take 20 mg by mouth daily before breakfast.  . polyethylene glycol (MIRALAX / GLYCOLAX) 17 g packet Take 17 g by mouth daily as needed for moderate constipation or severe constipation. (Patient taking differently: Take 17 g by mouth daily.)  . senna (SENOKOT) 8.6 MG TABS tablet Take 1 tablet (8.6 mg total) by mouth 2 (two) times daily. (Patient taking differently: Take 2 tablets by mouth at bedtime.)  . simvastatin (ZOCOR) 40 MG tablet TAKE 1 TABLET BY MOUTH AT BEDTIME (Patient taking differently: Take 40 mg by mouth at bedtime.)  . traZODone (DESYREL) 50 MG tablet Take 0.5-1 tablets (25-50 mg total) by mouth at bedtime as needed for sleep. (Patient taking differently: Take 25 mg by mouth at bedtime as needed for sleep.)  . witch hazel-glycerin (TUCKS) pad Apply 1 application topically as needed for itching.     No results found for this or any previous visit (from the past 48 hour(s)).  No results found.  Intake/Output    None      Review of Systems  Constitutional: Negative for chills, fever and malaise/fatigue.  Respiratory: Negative for shortness of breath and wheezing.   Cardiovascular: Negative for chest pain and palpitations.  Gastrointestinal: Negative for abdominal pain, nausea and vomiting.  Musculoskeletal:       Right elbow pain  Neurological:  Negative for tingling and sensory change.   Height 4' 11.5" (1.511 m), weight 49.9 kg. Physical Exam Vitals reviewed.  Constitutional:      General: She is not in acute distress.    Appearance: Normal appearance. She is well-developed.     Comments: Pleasant 87 year old female  HENT:     Head: Normocephalic and atraumatic.  Eyes:     Extraocular Movements: Extraocular movements intact.  Cardiovascular:     Rate and Rhythm: Tachycardia present.  Pulmonary:     Effort: Pulmonary effort is normal.     Breath sounds: Normal breath sounds.  Abdominal:     General: Bowel sounds are normal.     Palpations: Abdomen is soft.  Musculoskeletal:     Cervical back: Normal range of motion.     Comments: Right upper extremity Radial, ulnar,  median nerve motor and sensory functions intact No abrasions over the olecranon or pressure sores Swelling is well controlled + Radial pulse No tenderness over her shoulder or wrist.  No deformities noted either No other acute findings noted to the right upper extremity  Skin:    General: Skin is warm and dry.     Capillary Refill: Capillary refill takes less than 2 seconds.  Neurological:     General: No focal deficit present.     Mental Status: She is alert and oriented to person, place, and time.  Psychiatric:        Mood and Affect: Mood normal.        Behavior: Behavior normal. Behavior is cooperative.        Thought Content: Thought content normal.      Assessment/Plan:  87 year old right-hand-dominant female with right transverse supracondylar distal humerus fracture  -Right transverse upon the distal humerus fracture  OR for ORIF  Anticipate overnight admission and discharge in the morning  Risks and benefits reviewed with the patient and she wishes to proceed  We are hopeful to avoid olecranon osteotomy which would then allow for immediate range of motion  No lifting with right arm for 6 weeks   Given that her injury occurred about  6 weeks ago she is at increased risk for complications including joint stiffness and HO  - Pain management:  Titrate accordingly postop  - Medical issues   Restart necessary home medications postop  - ID:   Perioperative antibiotics  - Metabolic Bone Disease:  Check vitamin D levels  Fracture suggestive of poor bone quality/osteoporosis  - FEN/GI prophylaxis/Foley/Lines:  NPO  Advance diet postop  - Impediments to fracture healing:  Poor bone quality  - Dispo:  OR for ORIF right distal humerus   Jari Pigg, PA-C [phone] (C) 02/12/2020, 12:45 PM  [facility] [place] [phone] Jenetta [phone] (F)    After 5pm and on the weekends please log on to Amion, go to orthopaedics and the look under the [facility] Call for the provider(s) on call. You can also call our office at [phone] and then follow the prompts to be connected to the call team.

## 2020-02-12 NOTE — Anesthesia Preprocedure Evaluation (Signed)
Anesthesia Evaluation  Patient identified by MRN, date of birth, ID band Patient awake    Reviewed: Allergy & Precautions, NPO status , Patient's Chart, lab work & pertinent test results  Airway Mallampati: II  TM Distance: >3 FB Neck ROM: Full    Dental  (+) Dental Advisory Given   Pulmonary neg pulmonary ROS,    breath sounds clear to auscultation       Cardiovascular hypertension, Pt. on medications  Rhythm:Regular Rate:Normal     Neuro/Psych    GI/Hepatic Neg liver ROS, hiatal hernia, GERD  Medicated,  Endo/Other  diabetes, Type 2Hypothyroidism   Renal/GU Renal disease     Musculoskeletal   Abdominal   Peds  Hematology  (+) anemia ,   Anesthesia Other Findings   Reproductive/Obstetrics                            Anesthesia Physical Anesthesia Plan  ASA: III  Anesthesia Plan: MAC and Regional   Post-op Pain Management:    Induction:   PONV Risk Score and Plan: 3 and Ondansetron, Dexamethasone and Treatment may vary due to age or medical condition  Airway Management Planned: LMA, Natural Airway and Simple Face Mask  Additional Equipment: None  Intra-op Plan:   Post-operative Plan: Extubation in OR  Informed Consent: I have reviewed the patients History and Physical, chart, labs and discussed the procedure including the risks, benefits and alternatives for the proposed anesthesia with the patient or authorized representative who has indicated his/her understanding and acceptance.     Dental advisory given  Plan Discussed with: CRNA  Anesthesia Plan Comments: (Pt with severe hoarseness and discomfort in her throat post GETA approx 6 months ago. Pt requests MAC with PNB. Discussed risks and alternatives including GA with LMA. Pt would like to try PNB with sedation. If PNB not adequate, pt understands that GA with LMA will be back up plan. )       Anesthesia Quick  Evaluation

## 2020-02-13 ENCOUNTER — Ambulatory Visit (HOSPITAL_COMMUNITY): Payer: PPO | Admitting: Physician Assistant

## 2020-02-13 ENCOUNTER — Encounter (HOSPITAL_COMMUNITY): Payer: Self-pay | Admitting: Orthopedic Surgery

## 2020-02-13 ENCOUNTER — Ambulatory Visit (HOSPITAL_COMMUNITY): Payer: PPO

## 2020-02-13 ENCOUNTER — Other Ambulatory Visit: Payer: Self-pay

## 2020-02-13 ENCOUNTER — Encounter (HOSPITAL_COMMUNITY)
Admission: RE | Disposition: A | Discharge status: Home / Self Care | Payer: Self-pay | Source: Home / Self Care | Attending: Orthopedic Surgery

## 2020-02-13 ENCOUNTER — Ambulatory Visit (HOSPITAL_COMMUNITY)
Admission: RE | Admit: 2020-02-13 | Discharge: 2020-02-13 | Disposition: A | Discharge status: Home / Self Care | Payer: PPO | Attending: Orthopedic Surgery | Admitting: Orthopedic Surgery

## 2020-02-13 DIAGNOSIS — E1122 Type 2 diabetes mellitus with diabetic chronic kidney disease: Secondary | ICD-10-CM | POA: Insufficient documentation

## 2020-02-13 DIAGNOSIS — Z888 Allergy status to other drugs, medicaments and biological substances status: Secondary | ICD-10-CM | POA: Diagnosis not present

## 2020-02-13 DIAGNOSIS — I10 Essential (primary) hypertension: Secondary | ICD-10-CM | POA: Diagnosis not present

## 2020-02-13 DIAGNOSIS — Z91041 Radiographic dye allergy status: Secondary | ICD-10-CM | POA: Insufficient documentation

## 2020-02-13 DIAGNOSIS — S42471D Displaced transcondylar fracture of right humerus, subsequent encounter for fracture with routine healing: Secondary | ICD-10-CM | POA: Diagnosis not present

## 2020-02-13 DIAGNOSIS — Z20822 Contact with and (suspected) exposure to covid-19: Secondary | ICD-10-CM | POA: Insufficient documentation

## 2020-02-13 DIAGNOSIS — E1136 Type 2 diabetes mellitus with diabetic cataract: Secondary | ICD-10-CM | POA: Insufficient documentation

## 2020-02-13 DIAGNOSIS — Z79899 Other long term (current) drug therapy: Secondary | ICD-10-CM | POA: Diagnosis not present

## 2020-02-13 DIAGNOSIS — N189 Chronic kidney disease, unspecified: Secondary | ICD-10-CM | POA: Insufficient documentation

## 2020-02-13 DIAGNOSIS — S42411A Displaced simple supracondylar fracture without intercondylar fracture of right humerus, initial encounter for closed fracture: Secondary | ICD-10-CM | POA: Diagnosis not present

## 2020-02-13 DIAGNOSIS — Z886 Allergy status to analgesic agent status: Secondary | ICD-10-CM | POA: Diagnosis not present

## 2020-02-13 DIAGNOSIS — T148XXA Other injury of unspecified body region, initial encounter: Secondary | ICD-10-CM

## 2020-02-13 DIAGNOSIS — E119 Type 2 diabetes mellitus without complications: Secondary | ICD-10-CM | POA: Diagnosis not present

## 2020-02-13 DIAGNOSIS — I129 Hypertensive chronic kidney disease with stage 1 through stage 4 chronic kidney disease, or unspecified chronic kidney disease: Secondary | ICD-10-CM | POA: Insufficient documentation

## 2020-02-13 DIAGNOSIS — Z01818 Encounter for other preprocedural examination: Secondary | ICD-10-CM | POA: Diagnosis not present

## 2020-02-13 DIAGNOSIS — G2581 Restless legs syndrome: Secondary | ICD-10-CM | POA: Diagnosis not present

## 2020-02-13 DIAGNOSIS — S42412A Displaced simple supracondylar fracture without intercondylar fracture of left humerus, initial encounter for closed fracture: Secondary | ICD-10-CM | POA: Diagnosis not present

## 2020-02-13 DIAGNOSIS — Z882 Allergy status to sulfonamides status: Secondary | ICD-10-CM | POA: Insufficient documentation

## 2020-02-13 DIAGNOSIS — W1830XA Fall on same level, unspecified, initial encounter: Secondary | ICD-10-CM | POA: Insufficient documentation

## 2020-02-13 DIAGNOSIS — J9811 Atelectasis: Secondary | ICD-10-CM | POA: Diagnosis not present

## 2020-02-13 DIAGNOSIS — G8918 Other acute postprocedural pain: Secondary | ICD-10-CM | POA: Diagnosis not present

## 2020-02-13 DIAGNOSIS — Z79890 Hormone replacement therapy (postmenopausal): Secondary | ICD-10-CM | POA: Diagnosis not present

## 2020-02-13 DIAGNOSIS — T1490XA Injury, unspecified, initial encounter: Secondary | ICD-10-CM

## 2020-02-13 HISTORY — DX: Unspecified hemorrhoids: K64.9

## 2020-02-13 HISTORY — PX: ORIF HUMERUS FRACTURE: SHX2126

## 2020-02-13 HISTORY — DX: Constipation, unspecified: K59.00

## 2020-02-13 HISTORY — DX: Anemia, unspecified: D64.9

## 2020-02-13 HISTORY — DX: Hyperlipidemia, unspecified: E78.5

## 2020-02-13 HISTORY — DX: Restless legs syndrome: G25.81

## 2020-02-13 HISTORY — DX: Hypothyroidism, unspecified: E03.9

## 2020-02-13 HISTORY — DX: Type 2 diabetes mellitus without complications: E11.9

## 2020-02-13 HISTORY — DX: Anxiety disorder, unspecified: F41.9

## 2020-02-13 LAB — COMPREHENSIVE METABOLIC PANEL
ALT: 12 U/L (ref 0–44)
AST: 18 U/L (ref 15–41)
Albumin: 3.6 g/dL (ref 3.5–5.0)
Alkaline Phosphatase: 54 U/L (ref 38–126)
Anion gap: 9 (ref 5–15)
BUN: 15 mg/dL (ref 8–23)
CO2: 27 mmol/L (ref 22–32)
Calcium: 9.7 mg/dL (ref 8.9–10.3)
Chloride: 94 mmol/L — ABNORMAL LOW (ref 98–111)
Creatinine, Ser: 0.93 mg/dL (ref 0.44–1.00)
GFR, Estimated: 59 mL/min — ABNORMAL LOW (ref >60)
Glucose, Bld: 177 mg/dL — ABNORMAL HIGH (ref 70–99)
Potassium: 4 mmol/L (ref 3.5–5.1)
Sodium: 130 mmol/L — ABNORMAL LOW (ref 135–145)
Total Bilirubin: 0.3 mg/dL (ref 0.3–1.2)
Total Protein: 5.9 g/dL — ABNORMAL LOW (ref 6.5–8.1)

## 2020-02-13 LAB — CBC WITH DIFFERENTIAL/PLATELET
Abs Immature Granulocytes: 0.02 10*3/uL (ref 0.00–0.07)
Basophils Absolute: 0 10*3/uL (ref 0.0–0.1)
Basophils Relative: 1 %
Eosinophils Absolute: 0.1 10*3/uL (ref 0.0–0.5)
Eosinophils Relative: 2 %
HCT: 29.9 % — ABNORMAL LOW (ref 36.0–46.0)
Hemoglobin: 10.1 g/dL — ABNORMAL LOW (ref 12.0–15.0)
Immature Granulocytes: 0 %
Lymphocytes Relative: 29 %
Lymphs Abs: 1.9 10*3/uL (ref 0.7–4.0)
MCH: 31.1 pg (ref 26.0–34.0)
MCHC: 33.8 g/dL (ref 30.0–36.0)
MCV: 92 fL (ref 80.0–100.0)
Monocytes Absolute: 0.6 10*3/uL (ref 0.1–1.0)
Monocytes Relative: 9 %
Neutro Abs: 3.8 10*3/uL (ref 1.7–7.7)
Neutrophils Relative %: 59 %
Platelets: 310 10*3/uL (ref 150–400)
RBC: 3.25 MIL/uL — ABNORMAL LOW (ref 3.87–5.11)
RDW: 13.2 % (ref 11.5–15.5)
WBC: 6.5 10*3/uL (ref 4.0–10.5)
nRBC: 0 % (ref 0.0–0.2)

## 2020-02-13 LAB — SARS CORONAVIRUS 2 BY RT PCR (HOSPITAL ORDER, PERFORMED IN ~~LOC~~ HOSPITAL LAB): SARS Coronavirus 2: NEGATIVE

## 2020-02-13 LAB — VITAMIN D 25 HYDROXY (VIT D DEFICIENCY, FRACTURES): Vit D, 25-Hydroxy: 82.32 ng/mL (ref 30–100)

## 2020-02-13 LAB — URINALYSIS, ROUTINE W REFLEX MICROSCOPIC
Bilirubin Urine: NEGATIVE
Glucose, UA: NEGATIVE mg/dL
Hgb urine dipstick: NEGATIVE
Ketones, ur: NEGATIVE mg/dL
Leukocytes,Ua: NEGATIVE
Nitrite: NEGATIVE
Protein, ur: NEGATIVE mg/dL
Specific Gravity, Urine: 1.008 (ref 1.005–1.030)
pH: 6 (ref 5.0–8.0)

## 2020-02-13 LAB — PROTIME-INR
INR: 1 (ref 0.8–1.2)
Prothrombin Time: 12.4 seconds (ref 11.4–15.2)

## 2020-02-13 LAB — TYPE AND SCREEN
ABO/RH(D): AB POS
Antibody Screen: NEGATIVE

## 2020-02-13 LAB — GLUCOSE, CAPILLARY
Glucose-Capillary: 158 mg/dL — ABNORMAL HIGH (ref 70–99)
Glucose-Capillary: 174 mg/dL — ABNORMAL HIGH (ref 70–99)

## 2020-02-13 LAB — APTT: aPTT: 27 seconds (ref 24–36)

## 2020-02-13 LAB — ABO/RH: ABO/RH(D): AB POS

## 2020-02-13 SURGERY — OPEN REDUCTION INTERNAL FIXATION (ORIF) DISTAL HUMERUS FRACTURE
Anesthesia: General | Site: Arm Upper | Laterality: Right

## 2020-02-13 MED ORDER — FENTANYL CITRATE (PF) 100 MCG/2ML IJ SOLN
25.0000 ug | INTRAMUSCULAR | Status: DC | PRN
  Administered 2020-02-13: 25 ug via INTRAVENOUS

## 2020-02-13 MED ORDER — FENTANYL CITRATE (PF) 100 MCG/2ML IJ SOLN
INTRAMUSCULAR | Status: AC
  Filled 2020-02-13: qty 2

## 2020-02-13 MED ORDER — 0.9 % SODIUM CHLORIDE (POUR BTL) OPTIME
TOPICAL | Status: DC | PRN
  Administered 2020-02-13: 1000 mL

## 2020-02-13 MED ORDER — LACTATED RINGERS IV SOLN: INTRAVENOUS | Status: DC

## 2020-02-13 MED ORDER — PROPOFOL 500 MG/50ML IV EMUL
INTRAVENOUS | Status: DC | PRN
  Administered 2020-02-13: 50 ug/kg/min via INTRAVENOUS

## 2020-02-13 MED ORDER — CEFAZOLIN SODIUM-DEXTROSE 2-4 GM/100ML-% IV SOLN
2.0000 g | INTRAVENOUS | Status: AC
Start: 2020-02-13 — End: 2020-02-13
  Administered 2020-02-13: 2 g via INTRAVENOUS
  Filled 2020-02-13: qty 100

## 2020-02-13 MED ORDER — CHLORHEXIDINE GLUCONATE 0.12 % MT SOLN
OROMUCOSAL | Status: AC
  Administered 2020-02-13: 15 mL
  Filled 2020-02-13: qty 15

## 2020-02-13 MED ORDER — HYDROCODONE-ACETAMINOPHEN 5-325 MG PO TABS: 1.0000 | ORAL_TABLET | Freq: Four times a day (QID) | ORAL | 0 refills | Status: DC | PRN

## 2020-02-13 MED ORDER — ACETAMINOPHEN 500 MG PO TABS: 500.0000 mg | ORAL_TABLET | Freq: Two times a day (BID) | ORAL | 0 refills | Status: DC

## 2020-02-13 MED ORDER — FENTANYL CITRATE (PF) 100 MCG/2ML IJ SOLN: 50.0000 ug | Freq: Once | INTRAMUSCULAR | Status: AC

## 2020-02-13 MED ORDER — PROPOFOL 10 MG/ML IV BOLUS
INTRAVENOUS | Status: AC
  Filled 2020-02-13: qty 40

## 2020-02-13 MED ORDER — PROPOFOL 10 MG/ML IV BOLUS
INTRAVENOUS | Status: DC | PRN
  Administered 2020-02-13: 150 mg via INTRAVENOUS

## 2020-02-13 MED ORDER — CHLORHEXIDINE GLUCONATE 4 % EX LIQD: 60.0000 mL | Freq: Once | CUTANEOUS | Status: DC

## 2020-02-13 MED ORDER — ORAL CARE MOUTH RINSE: 15.0000 mL | Freq: Once | OROMUCOSAL | Status: AC

## 2020-02-13 MED ORDER — FENTANYL CITRATE (PF) 100 MCG/2ML IJ SOLN
INTRAMUSCULAR | Status: DC | PRN
  Administered 2020-02-13 (×4): 25 ug via INTRAVENOUS

## 2020-02-13 MED ORDER — BUPIVACAINE-EPINEPHRINE (PF) 0.5% -1:200000 IJ SOLN
INTRAMUSCULAR | Status: DC | PRN
  Administered 2020-02-13: 30 mL via PERINEURAL

## 2020-02-13 MED ORDER — POVIDONE-IODINE 10 % EX SWAB: 2.0000 "application " | Freq: Once | CUTANEOUS | Status: DC

## 2020-02-13 MED ORDER — CHLORHEXIDINE GLUCONATE 0.12 % MT SOLN: 15.0000 mL | Freq: Once | OROMUCOSAL | Status: AC

## 2020-02-13 MED ORDER — ONDANSETRON HCL 4 MG/2ML IJ SOLN
INTRAMUSCULAR | Status: DC | PRN
  Administered 2020-02-13: 4 mg via INTRAVENOUS

## 2020-02-13 MED ORDER — ACETAMINOPHEN 500 MG PO TABS
ORAL_TABLET | ORAL | Status: AC
  Filled 2020-02-13: qty 1

## 2020-02-13 MED ORDER — ACETAMINOPHEN 500 MG PO TABS
1000.0000 mg | ORAL_TABLET | Freq: Once | ORAL | Status: AC
  Administered 2020-02-13: 1000 mg via ORAL
  Filled 2020-02-13: qty 2

## 2020-02-13 MED ORDER — PROPOFOL 1000 MG/100ML IV EMUL
INTRAVENOUS | Status: AC
  Filled 2020-02-13: qty 100

## 2020-02-13 MED ORDER — FENTANYL CITRATE (PF) 100 MCG/2ML IJ SOLN
INTRAMUSCULAR | Status: AC
  Administered 2020-02-13: 50 ug via INTRAVENOUS
  Filled 2020-02-13: qty 2

## 2020-02-13 MED ORDER — FENTANYL CITRATE (PF) 250 MCG/5ML IJ SOLN
INTRAMUSCULAR | Status: AC
  Filled 2020-02-13: qty 5

## 2020-02-13 SURGICAL SUPPLY — 88 items
BIT DRILL 2.0 LNG QUCK RELEASE (BIT) ×1 IMPLANT
BIT DRILL 2.8 QUICK RELEASE (BIT) ×1 IMPLANT
BLADE AVERAGE 25X9 (BLADE) IMPLANT
BNDG COHESIVE 4X5 TAN STRL (GAUZE/BANDAGES/DRESSINGS) ×2 IMPLANT
BNDG ELASTIC 4X5.8 VLCR STR LF (GAUZE/BANDAGES/DRESSINGS) ×2 IMPLANT
BNDG ESMARK 4X9 LF (GAUZE/BANDAGES/DRESSINGS) IMPLANT
BNDG GAUZE ELAST 4 BULKY (GAUZE/BANDAGES/DRESSINGS) ×4 IMPLANT
BRUSH SCRUB EZ PLAIN DRY (MISCELLANEOUS) ×4 IMPLANT
CORD BIPOLAR FORCEPS 12FT (ELECTRODE) IMPLANT
COVER SURGICAL LIGHT HANDLE (MISCELLANEOUS) ×2 IMPLANT
COVER WAND RF STERILE (DRAPES) IMPLANT
DRAIN PENROSE 0.25X18 (DRAIN) ×2 IMPLANT
DRAIN PENROSE 1/4X12 LTX STRL (WOUND CARE) IMPLANT
DRAPE C-ARM 42X72 X-RAY (DRAPES) ×2 IMPLANT
DRAPE C-ARMOR (DRAPES) IMPLANT
DRAPE HALF SHEET 40X57 (DRAPES) ×2 IMPLANT
DRAPE INCISE IOBAN 66X45 STRL (DRAPES) IMPLANT
DRAPE ORTHO SPLIT 77X108 STRL (DRAPES) ×1
DRAPE SURG ORHT 6 SPLT 77X108 (DRAPES) ×1 IMPLANT
DRAPE U-SHAPE 47X51 STRL (DRAPES) ×4 IMPLANT
DRILL 2.0 LNG QUICK RELEASE (BIT) ×2
DRILL 2.8 QUICK RELEASE (BIT) ×2
DRSG ADAPTIC 3X8 NADH LF (GAUZE/BANDAGES/DRESSINGS) IMPLANT
DRSG MEPITEL 4X7.2 (GAUZE/BANDAGES/DRESSINGS) ×2 IMPLANT
DRSG PAD ABDOMINAL 8X10 ST (GAUZE/BANDAGES/DRESSINGS) IMPLANT
ELECT REM PT RETURN 9FT ADLT (ELECTROSURGICAL) ×2
ELECTRODE REM PT RTRN 9FT ADLT (ELECTROSURGICAL) ×1 IMPLANT
EVACUATOR 1/8 PVC DRAIN (DRAIN) IMPLANT
GAUZE SPONGE 4X4 12PLY STRL (GAUZE/BANDAGES/DRESSINGS) ×2 IMPLANT
GAUZE SPONGE 4X4 12PLY STRL LF (GAUZE/BANDAGES/DRESSINGS) ×2 IMPLANT
GLOVE BIO SURGEON STRL SZ7.5 (GLOVE) ×2 IMPLANT
GLOVE BIOGEL PI IND STRL 7.5 (GLOVE) ×1 IMPLANT
GLOVE BIOGEL PI INDICATOR 7.5 (GLOVE) ×1
GLOVE SRG 8 PF TXTR STRL LF DI (GLOVE) ×1 IMPLANT
GLOVE SURG UNDER POLY LF SZ8 (GLOVE) ×1
GOWN STRL REUS W/ TWL LRG LVL3 (GOWN DISPOSABLE) ×1 IMPLANT
GOWN STRL REUS W/ TWL XL LVL3 (GOWN DISPOSABLE) ×2 IMPLANT
GOWN STRL REUS W/TWL LRG LVL3 (GOWN DISPOSABLE) ×1
GOWN STRL REUS W/TWL XL LVL3 (GOWN DISPOSABLE) ×2
GUIDEWIRE ORTH 6X062XTROC NS (WIRE) ×3 IMPLANT
K-WIRE .062 (WIRE) ×3
KIT BASIN OR (CUSTOM PROCEDURE TRAY) ×2 IMPLANT
KIT TURNOVER KIT B (KITS) ×2 IMPLANT
MANIFOLD NEPTUNE II (INSTRUMENTS) ×2 IMPLANT
NEEDLE HYPO 25X1 1.5 SAFETY (NEEDLE) IMPLANT
NS IRRIG 1000ML POUR BTL (IV SOLUTION) ×2 IMPLANT
PACK ORTHO EXTREMITY (CUSTOM PROCEDURE TRAY) ×2 IMPLANT
PAD ARMBOARD 7.5X6 YLW CONV (MISCELLANEOUS) ×4 IMPLANT
PADDING CAST SYNTHETIC 4 (CAST SUPPLIES) ×1
PADDING CAST SYNTHETIC 4X4 STR (CAST SUPPLIES) ×1 IMPLANT
PLATE BONE POST 5H RT ELBOW (Plate) ×2 IMPLANT
PLATE MEDIAL 8 HOLE (Plate) ×2 IMPLANT
PLATE TAP FOR 3.5 SCREW (TAP) ×2 IMPLANT
SCREW CORTICAL 3.5X20MM (Screw) ×4 IMPLANT
SCREW HEX LOCK 2.7X16MM (Screw) ×2 IMPLANT
SCREW HEXALOBE LOCK 3.5X50MM (Screw) ×4 IMPLANT
SCREW HEXALOBE LOCKING 3.5X16M (Screw) ×2 IMPLANT
SCREW LOCK 18X2.7X HEXALOBE (Screw) ×2 IMPLANT
SCREW LOCK 22X2.7X HEXALOBE (Screw) ×1 IMPLANT
SCREW LOCK 40X3.5X HEXALOBE (Screw) ×1 IMPLANT
SCREW LOCKING 2.7X18MM (Screw) ×2 IMPLANT
SCREW LOCKING 2.7X22MM (Screw) ×1 IMPLANT
SCREW LOCKING 3.5X40 (Screw) ×1 IMPLANT
SCREW NON LOCKING HEX 3.5X18MM (Screw) ×6 IMPLANT
SCREW NON LOCKING HEX 3.5X24 (Screw) ×2 IMPLANT
SCREW NONLOCK HEX 2.7X18MM (Screw) ×2 IMPLANT
SLING ARM FOAM STRAP MED (SOFTGOODS) ×2 IMPLANT
SOL PREP PROV IODINE SCRUB 4OZ (MISCELLANEOUS) ×2 IMPLANT
SPONGE LAP 18X18 RF (DISPOSABLE) ×2 IMPLANT
STAPLER VISISTAT 35W (STAPLE) ×2 IMPLANT
STOCKINETTE IMPERVIOUS 9X36 MD (GAUZE/BANDAGES/DRESSINGS) ×2 IMPLANT
SUCTION FRAZIER HANDLE 10FR (MISCELLANEOUS) ×1
SUCTION TUBE FRAZIER 10FR DISP (MISCELLANEOUS) ×1 IMPLANT
SUT ETHILON 2 0 FS 18 (SUTURE) ×4 IMPLANT
SUT ETHILON 3 0 PS 1 (SUTURE) IMPLANT
SUT VIC AB 0 CT1 27 (SUTURE) ×1
SUT VIC AB 0 CT1 27XBRD ANBCTR (SUTURE) ×1 IMPLANT
SUT VIC AB 1 CT1 27 (SUTURE) ×1
SUT VIC AB 1 CT1 27XBRD ANBCTR (SUTURE) ×1 IMPLANT
SUT VIC AB 2-0 CT1 27 (SUTURE) ×1
SUT VIC AB 2-0 CT1 TAPERPNT 27 (SUTURE) ×1 IMPLANT
SYR 5ML LL (SYRINGE) IMPLANT
SYR CONTROL 10ML LL (SYRINGE) IMPLANT
TOWEL GREEN STERILE (TOWEL DISPOSABLE) ×2 IMPLANT
TOWEL GREEN STERILE FF (TOWEL DISPOSABLE) ×2 IMPLANT
TRAY FOLEY MTR SLVR 16FR STAT (SET/KITS/TRAYS/PACK) IMPLANT
WATER STERILE IRR 1000ML POUR (IV SOLUTION) ×2 IMPLANT
YANKAUER SUCT BULB TIP NO VENT (SUCTIONS) IMPLANT

## 2020-02-13 NOTE — Discharge Instructions (Addendum)
Orthopaedic Trauma Service Discharge Instructions   General Discharge Instructions  Orthopaedic Injuries:  Right distal humerus fracture treated with Open reduction and internal fixation using plates and screws   WEIGHT BEARING STATUS: no lifting or weightbearing through right arm. Sling for comfort   RANGE OF MOTION/ACTIVITY: unrestricted range of motion of right elbow.  Move your elbow as much as possible.  Unrestricted motion of R shoulder, forearm and wrist as well   Bone health:  Continue with vitamin d supplements. Recommend bone density scan in the next 4-8 weeks   Wound Care: daily wound care starting on 02/15/2020. See below   Discharge Wound Care Instructions  Do NOT apply any ointments, solutions or lotions to pin sites or surgical wounds.  These prevent needed drainage and even though solutions like hydrogen peroxide kill bacteria, they also damage cells lining the pin sites that help fight infection.  Applying lotions or ointments can keep the wounds moist and can cause them to breakdown and open up as well. This can increase the risk for infection. When in doubt call the office.  Surgical incisions should be dressed daily starting on 02/15/2020  If any drainage is noted, use one layer of adaptic, then gauze, Kerlix, and an ace wrap.  Once the incision is completely dry and without drainage, it may be left open to air out.  Showering may begin 36-48 hours later.  Cleaning gently with soap and water.    Diet: as you were eating previously.  Can use over the counter stool softeners and bowel preparations, such as Miralax, to help with bowel movements.  Narcotics can be constipating.  Be sure to drink plenty of fluids  PAIN MEDICATION USE AND EXPECTATIONS  You have likely been given narcotic medications to help control your pain.  After a traumatic event that results in an fracture (broken bone) with or without surgery, it is ok to use narcotic pain medications to help  control one's pain.  We understand that everyone responds to pain differently and each individual patient will be evaluated on a regular basis for the continued need for narcotic medications. Ideally, narcotic medication use should last no more than 6-8 weeks (coinciding with fracture healing).   As a patient it is your responsibility as well to monitor narcotic medication use and report the amount and frequency you use these medications when you come to your office visit.   We would also advise that if you are using narcotic medications, you should take a dose prior to therapy to maximize you participation.  IF YOU ARE ON NARCOTIC MEDICATIONS IT IS NOT PERMISSIBLE TO OPERATE A MOTOR VEHICLE (MOTORCYCLE/CAR/TRUCK/MOPED) OR HEAVY MACHINERY DO NOT MIX NARCOTICS WITH OTHER CNS (CENTRAL NERVOUS SYSTEM) DEPRESSANTS SUCH AS ALCOHOL   STOP SMOKING OR USING NICOTINE PRODUCTS!!!!  As discussed nicotine severely impairs your body's ability to heal surgical and traumatic wounds but also impairs bone healing.  Wounds and bone heal by forming microscopic blood vessels (angiogenesis) and nicotine is a vasoconstrictor (essentially, shrinks blood vessels).  Therefore, if vasoconstriction occurs to these microscopic blood vessels they essentially disappear and are unable to deliver necessary nutrients to the healing tissue.  This is one modifiable factor that you can do to dramatically increase your chances of healing your injury.    (This means no smoking, no nicotine gum, patches, etc)  DO NOT USE NONSTEROIDAL ANTI-INFLAMMATORY DRUGS (NSAID'S)  Using products such as Advil (ibuprofen), Aleve (naproxen), Motrin (ibuprofen) for additional pain control during fracture  healing can delay and/or prevent the healing response.  If you would like to take over the counter (OTC) medication, Tylenol (acetaminophen) is ok.  However, some narcotic medications that are given for pain control contain acetaminophen as well. Therefore,  you should not exceed more than 4000 mg of tylenol in a day if you do not have liver disease.  Also note that there are may OTC medicines, such as cold medicines and allergy medicines that my contain tylenol as well.  If you have any questions about medications and/or interactions please ask your doctor/PA or your pharmacist.      ICE AND ELEVATE INJURED/OPERATIVE EXTREMITY  Using ice and elevating the injured extremity above your heart can help with swelling and pain control.  Icing in a pulsatile fashion, such as 20 minutes on and 20 minutes off, can be followed.    Do not place ice directly on skin. Make sure there is a barrier between to skin and the ice pack.    Using frozen items such as frozen peas works well as the conform nicely to the are that needs to be iced.  USE AN ACE WRAP OR TED HOSE FOR SWELLING CONTROL  In addition to icing and elevation, Ace wraps or TED hose are used to help limit and resolve swelling.  It is recommended to use Ace wraps or TED hose until you are informed to stop.    When using Ace Wraps start the wrapping distally (farthest away from the body) and wrap proximally (closer to the body)   Example: If you had surgery on your leg or thing and you do not have a splint on, start the ace wrap at the toes and work your way up to the thigh        If you had surgery on your upper extremity and do not have a splint on, start the ace wrap at your fingers and work your way up to the upper arm  IF YOU ARE IN A SPLINT OR CAST DO NOT [place]   If your splint gets wet for any reason please contact the office immediately. You may shower in your splint or cast as long as you keep it dry.  This can be done by wrapping in a cast cover or garbage back (or similar)  Do Not stick any thing down your splint or cast such as pencils, money, or hangers to try and scratch yourself with.  If you feel itchy take benadryl as prescribed on the bottle for itching  IF YOU ARE IN  A CAM BOOT (BLACK BOOT)  You may remove boot periodically. Perform daily dressing changes as noted below.  Wash the liner of the boot regularly and wear a sock when wearing the boot. It is recommended that you sleep in the boot until told otherwise    Call office for the following:  Temperature greater than 101F  Persistent nausea and vomiting  Severe uncontrolled pain  Redness, tenderness, or signs of infection (pain, swelling, redness, odor or green/yellow discharge around the site)  Difficulty breathing, headache or visual disturbances  Hives  Persistent dizziness or light-headedness  Extreme fatigue  Any other questions or concerns you may have after discharge  In an emergency, call 911 or go to an Emergency Department at a nearby hospital  HELPFUL INFORMATION  ? If you had a block, it will wear off between 8-24 hrs postop typically.  This is period when your pain may go from  nearly zero to the pain you would have had postop without the block.  This is an abrupt transition but nothing dangerous is happening.  You may take an extra dose of narcotic when this happens.  ? You should wean off your narcotic medicines as soon as you are able.  Most patients will be off or using minimal narcotics before their first postop appointment.   ? We suggest you use the pain medication the first night prior to going to bed, in order to ease any pain when the anesthesia wears off. You should avoid taking pain medications on an empty stomach as it will make you nauseous.  ? Do not drink alcoholic beverages or take illicit drugs when taking pain medications.  ? In most states it is against the law to drive while you are in a splint or sling.  And certainly against the law to drive while taking narcotics.  ? You may return to work/school in the next couple of days when you feel up to it.   ? Pain medication may make you constipated.  Below are a few solutions to try in this  order: - Decrease the amount of pain medication if you arent having pain. - Drink lots of decaffeinated fluids. - Drink prune juice and/or each dried prunes  o If the first 3 dont work start with additional solutions - Take Colace - an over-the-counter stool softener - Take Senokot - an over-the-counter laxative - Take Miralax - a stronger over-the-counter laxative     CALL THE OFFICE WITH ANY QUESTIONS OR CONCERNS: [phone]   VISIT OUR WEBSITE FOR ADDITIONAL INFORMATION: [email or web address]

## 2020-02-13 NOTE — Transfer of Care (Signed)
Immediate Anesthesia Transfer of Care Note  Patient: Lisa Mills  Procedure(s) Performed: OPEN REDUCTION INTERNAL FIXATION (ORIF) SUPRACONDYLAR  HUMERUS FRACTURE (Right Arm Upper)  Patient Location: PACU  Anesthesia Type:GA combined with regional for post-op pain  Level of Consciousness: drowsy  Airway & Oxygen Therapy: Patient Spontanous Breathing and Patient connected to face mask oxygen  Post-op Assessment: Report given to RN and Post -op Vital signs reviewed and stable  Post vital signs: Reviewed and stable  Last Vitals:  Vitals Value Taken Time  BP 160/74 02/13/20 1115  Temp 36.1 C 02/13/20 1115  Pulse 66 02/13/20 1117  Resp 17 02/13/20 1117  SpO2 100 % 02/13/20 1117  Vitals shown include unvalidated device data.  Last Pain:  Vitals:   02/13/20 0717  TempSrc:   PainSc: 0-No pain         Complications: No complications documented.

## 2020-02-13 NOTE — Anesthesia Postprocedure Evaluation (Signed)
Anesthesia Post Note  Patient: Lisa Mills  Procedure(s) Performed: OPEN REDUCTION INTERNAL FIXATION (ORIF) SUPRACONDYLAR  HUMERUS FRACTURE (Right Arm Upper)     Patient location during evaluation: PACU Anesthesia Type: General Level of consciousness: awake and alert Pain management: pain level controlled Vital Signs Assessment: post-procedure vital signs reviewed and stable Respiratory status: spontaneous breathing, nonlabored ventilation, respiratory function stable and patient connected to nasal cannula oxygen Cardiovascular status: blood pressure returned to baseline and stable Postop Assessment: no apparent nausea or vomiting Anesthetic complications: no   No complications documented.  Last Vitals:  Vitals:   02/13/20 1200 02/13/20 1215  BP: (!) 198/79 (!) 175/82  Pulse: 71 69  Resp: 15 11  Temp:  36.5 C  SpO2: 94% 99%    Last Pain:  Vitals:   02/13/20 1145  TempSrc:   PainSc: 7                  Tiajuana Amass

## 2020-02-13 NOTE — Anesthesia Procedure Notes (Signed)
Procedure Name: LMA Insertion Date/Time: 02/13/2020 8:57 AM Performed by: Suzette Battiest, MD Pre-anesthesia Checklist: Patient identified, Emergency Drugs available, Suction available, Patient being monitored and Timeout performed Patient Re-evaluated:Patient Re-evaluated prior to induction Oxygen Delivery Method: Circle system utilized Preoxygenation: Pre-oxygenation with 100% oxygen Induction Type: IV induction Ventilation: Mask ventilation without difficulty LMA: LMA inserted

## 2020-02-13 NOTE — Anesthesia Procedure Notes (Signed)
Anesthesia Regional Block: Supraclavicular block   Pre-Anesthetic Checklist: ,, timeout performed, Correct Patient, Correct Site, Correct Laterality, Correct Procedure, Correct Position, site marked, Risks and benefits discussed,  Surgical consent,  Pre-op evaluation,  At surgeon's request and post-op pain management  Laterality: Right  Prep: chloraprep       Needles:  Injection technique: Single-shot  Needle Type: Echogenic Stimulator Needle     Needle Length: 9cm  Needle Gauge: 21     Additional Needles:   Procedures:, nerve stimulator,,, ultrasound used (permanent image in chart),,,,   Nerve Stimulator or Paresthesia:  Response: biceps, wrist extension, 0.6 mA,   Additional Responses:   Narrative:  Start time: 02/13/2020 7:55 AM End time: 02/13/2020 8:01 AM Injection made incrementally with aspirations every 5 mL.  Performed by: Personally  Anesthesiologist: Suzette Battiest, MD

## 2020-02-14 ENCOUNTER — Encounter (HOSPITAL_COMMUNITY): Payer: Self-pay | Admitting: Orthopedic Surgery

## 2020-02-16 ENCOUNTER — Other Ambulatory Visit: Payer: PPO

## 2020-02-16 ENCOUNTER — Other Ambulatory Visit: Payer: Self-pay

## 2020-02-16 DIAGNOSIS — Z515 Encounter for palliative care: Secondary | ICD-10-CM

## 2020-02-16 NOTE — Progress Notes (Signed)
COMMUNITY PALLIATIVE CARE SW NOTE  PATIENT NAME: Lisa Mills DOB: 1929-02-08 MRN: [id number]  PRIMARY CARE PROVIDER: Olin Hauser, DO  RESPONSIBLE PARTY:  Acct ID - Guarantor Home Phone Work Phone Relationship Acct Type  [id number] SWAYZIE, [phone]  Self P/F     [address], [place], [place] [address]     PLAN OF CARE and INTERVENTIONS:             1. GOALS OF CARE/ ADVANCE CARE PLANNING: Patient is a DNR. MOST completed. Patients goal is to remain in her home as independent as possible.   2.         SOCIAL/EMOTIONAL/SPIRITUAL ASSESSMENT/ INTERVENTIONS:  SW met with patient in patients home for a monthly home visit. Patient appeared to be doing well. He was well groomed and ambulating without assistance. Patient updated SW on recent medical changes. Patient had recent Right distal humerus fracture treated with Open reduction and internal fixation using plates and screws with same day discharge. [place] alert system discussed to assist with possible future falls. Patient has a new caregiver that can come [place] and Thu afternoons. Patient is able to ambulate w/o AD and is able to do simple things around the house. Son comes to visit her daily.   No medication changes. Patient has pain medication to take if needed.  Is supposed to be checking S, but is unable to do so due to hands being unstable. Patient is not checking BS. Patient has swelling to her R hand. She has a compression glove along wit arm sling in place. SW advised patient to  Continue to ice and keep hand elevated as much as possible.  SW called Dr. Marcelino Scot office to set up follow up appointment for patient. Appointment scheduled for Wed 2/16 $Remove'@[id number]$ . Patient also shared that she is wanting [facility] therapy as well. Dr. Carlean Jews office sent referrals to [facility] agencies and has not heard anything. SW outreached [facility] for possible referral need as well. Patient can benefit from [facility] pt to assist with her ait and balance, as she  states she feels a bit off balance at times after her recent fall. She could also benefit from [facility] OT due to her recent surgery to her R elbow and it being immobile.   SW discussed goals and reviewed care plan. Palliative care will continue to monitor and assist with long term care planning as needed.   3.         PATIENT/CAREGIVER EDUCATION/ COPING:  Patient A&O x3 and engaged in discussion with SW. Patient denied any feelings of anxiousness or depression. Patient continues going through grieving process. Family is supportive and visits and stays with patient often/daily.  4.         PERSONAL EMERGENCY PLAN:  Patient will call 9-1-1 for emergencies.   5.         COMMUNITY RESOURCES COORDINATION/ HEALTH CARE NAVIGATION:  Patient manages her own care with the assistance of her son.  6.         FINANCIAL/LEGAL CONCERNS/INTERVENTIONS:  None. Patient on fixed income.     SOCIAL HX:  Social History   Tobacco Use  . Smoking status: Never Smoker  . Smokeless tobacco: Never Used  Substance Use Topics  . Alcohol use: Not Currently    CODE STATUS: DNR ADVANCED DIRECTIVES: Y MOST FORM COMPLETE: N HOSPICE EDUCATION PROVIDED: N  EXB:[id number] is ambulatory w/o AD. Patient is independent with most ADL's. Requires assistance with cleaning and driving.  Time spent: 1hr.      Doreene Eland, LCSW

## 2020-02-19 ENCOUNTER — Telehealth: Payer: PPO

## 2020-02-20 DIAGNOSIS — E119 Type 2 diabetes mellitus without complications: Secondary | ICD-10-CM | POA: Diagnosis not present

## 2020-02-20 LAB — HM DIABETES EYE EXAM

## 2020-02-21 ENCOUNTER — Ambulatory Visit (INDEPENDENT_AMBULATORY_CARE_PROVIDER_SITE_OTHER): Payer: PPO | Admitting: Licensed Clinical Social Worker

## 2020-02-21 DIAGNOSIS — E785 Hyperlipidemia, unspecified: Secondary | ICD-10-CM | POA: Diagnosis not present

## 2020-02-21 DIAGNOSIS — F418 Other specified anxiety disorders: Secondary | ICD-10-CM

## 2020-02-21 DIAGNOSIS — N183 Chronic kidney disease, stage 3 unspecified: Secondary | ICD-10-CM

## 2020-02-21 DIAGNOSIS — E1122 Type 2 diabetes mellitus with diabetic chronic kidney disease: Secondary | ICD-10-CM

## 2020-02-21 DIAGNOSIS — F5104 Psychophysiologic insomnia: Secondary | ICD-10-CM

## 2020-02-21 DIAGNOSIS — E1169 Type 2 diabetes mellitus with other specified complication: Secondary | ICD-10-CM | POA: Diagnosis not present

## 2020-02-21 DIAGNOSIS — N184 Chronic kidney disease, stage 4 (severe): Secondary | ICD-10-CM

## 2020-02-21 NOTE — Chronic Care Management (AMB) (Signed)
Chronic Care Management    Clinical Social Work Note  02/21/2020 Name: Lisa Mills MRN: [id number] DOB: 05/05/1929  Lisa Mills is a 87 y.o. year old female who is a primary care patient of Olin Hauser, DO. The CCM team was consulted to assist the patient with chronic disease management and/or care coordination needs related to: Level of Care Concerns.   Engaged with patient by telephone for follow up visit in response to provider referral for social work chronic care management and care coordination services.   Consent to Services:  The patient was given the following information about Chronic Care Management services today, agreed to services, and gave verbal consent: 1. CCM service includes personalized support from designated clinical staff supervised by the primary care provider, including individualized plan of care and coordination with other care providers 2. 24/7 contact phone numbers for assistance for urgent and routine care needs. 3. Service will only be billed when office clinical staff spend 20 minutes or more in a month to coordinate care. 4. Only one practitioner may furnish and bill the service in a calendar month. 5.The patient may stop CCM services at any time (effective at the end of the month) by phone call to the office staff. 6. The patient will be responsible for cost sharing (co-pay) of up to 20% of the service fee (after annual deductible is met). Patient agreed to services and consent obtained.  Patient agreed to services and consent obtained.   Assessment: Review of patient past medical history, allergies, medications, and health status, including review of relevant consultants reports was performed today as part of a comprehensive evaluation and provision of chronic care management and care coordination services.     SDOH (Social Determinants of Health) assessments and interventions performed:    Advanced Directives Status: See Care Plan for related  entries.  CCM Care Plan  Allergies  Allergen Reactions  . Aspirin Other (See Comments)    Burns stomach  . Conray [Iothalamate] Hives    IV dye conray-400  . Dye Fdc Red [Red Dye] Hives  . Sulfa Antibiotics     Unknown Reaction, not used in years  . Prednisone     Indigestion     Outpatient Encounter Medications as of 02/21/2020  Medication Sig  . acetaminophen (TYLENOL) 500 MG tablet Take 1 tablet (500 mg total) by mouth every 12 (twelve) hours.  . Blood Glucose Monitoring Suppl ([place]) w/Device KIT Use glucometer to check blood sugar daily. (Patient not taking: Reported on 01/10/2020)  . Cholecalciferol (VITAMIN D) 50 MCG (2000 UT) CAPS Take 2,000 Units by mouth daily.   Marland Kitchen escitalopram (LEXAPRO) 5 MG tablet TAKE 1 TABLET BY MOUTH ONCE DAILY WITH FOOD (Patient taking differently: Take 5 mg by mouth daily.)  . ferrous sulfate 325 (65 FE) MG tablet Take 325 mg by mouth daily.   Marland Kitchen glucose blood (ONE TOUCH ULTRA TEST) test strip CHECK BLOOD SUGAR UP TO 2 TIMES A DAY.  Marland Kitchen glycerin adult 2 g suppository Place 1 suppository rectally as needed for constipation.  Marland Kitchen Histamine Dihydrochloride (AUSTRALIAN DREAM ARTHRITIS) 0.025 % CREA Apply 1 application topically daily as needed (Pain).  Marland Kitchen HYDROcodone-acetaminophen (NORCO) 5-325 MG tablet Take 1-2 tablets by mouth every 6 (six) hours as needed for moderate pain or severe pain.  . hydrocortisone (ANUSOL-HC) 2.5 % rectal cream USE 1 APPLICATION RECTALLY TWICE DAILY AS DIRECTED (Patient taking differently: Place 1 application rectally daily as needed for hemorrhoids or anal  itching.)  . levothyroxine (SYNTHROID) 25 MCG tablet TAKE 1 TABLET BY MOUTH ONCE DAILY ON AN EMPTY STOMACH. WAIT 30 MINUTES BEFORE TAKING OTHER MEDS. (Patient taking differently: Take 25 mcg by mouth daily before breakfast. AN EMPTY STOMACH. WAIT 30 MINUTES BEFORE TAKING OTHER MEDS.)  . lisinopril (ZESTRIL) 5 MG tablet Take 5 mg by mouth daily.  . nitrofurantoin  (MACRODANTIN) 100 MG capsule Take 1 capsule (100 mg total) by mouth daily.  Marland Kitchen omeprazole (PRILOSEC) 20 MG capsule Take 20 mg by mouth daily before breakfast.  . OneTouch Delica Lancets 33G MISC 1 each by Does not apply route 3 (three) times daily. Dx:E11.9  . polyethylene glycol (MIRALAX / GLYCOLAX) 17 g packet Take 17 g by mouth daily as needed for moderate constipation or severe constipation. (Patient taking differently: Take 17 g by mouth daily.)  . senna (SENOKOT) 8.6 MG TABS tablet Take 1 tablet (8.6 mg total) by mouth 2 (two) times daily. (Patient taking differently: Take 2 tablets by mouth at bedtime.)  . simvastatin (ZOCOR) 40 MG tablet TAKE 1 TABLET BY MOUTH AT BEDTIME (Patient taking differently: Take 40 mg by mouth at bedtime.)  . traZODone (DESYREL) 50 MG tablet Take 0.5-1 tablets (25-50 mg total) by mouth at bedtime as needed for sleep. (Patient taking differently: Take 25 mg by mouth at bedtime as needed for sleep.)  . witch hazel-glycerin (TUCKS) pad Apply 1 application topically as needed for itching.   No facility-administered encounter medications on file as of 02/21/2020.    Patient Active Problem List   Diagnosis Date Noted  . Psychophysiological insomnia 10/06/2019  . Nonintractable headache 10/06/2019  . Urinary incontinence 10/06/2019  . Internal and external bleeding hemorrhoids 08/15/2019  . Sacral pain   . Radicular pain of sacrum 07/11/2019  . Sacral fracture, closed (HCC) 07/04/2019  . Fall 07/04/2019  . Bilateral pubic rami fractures, sequela 07/04/2019  . Background diabetic retinopathy (HCC) 01/23/2019  . Schatzki's ring 03/04/2017  . Post herpetic neuralgia 10/06/2016  . Hx of colonic polyps 09/22/2016  . Osteopenia 09/02/2016  . Anxiety associated with depression 08/25/2016  . Trapezius muscle spasm 05/25/2016  . Intermittent left lower quadrant abdominal pain 03/16/2016  . Anemia in chronic kidney disease (CKD) 02/26/2016  . Type 2 diabetes mellitus  with stage 4 chronic kidney disease (HCC) 02/25/2016  . Hypothyroidism 02/25/2016  . Constipation 02/25/2016  . GERD (gastroesophageal reflux disease) 02/25/2016  . Hyperlipidemia associated with type 2 diabetes mellitus (HCC) 02/25/2016  . DJD (degenerative joint disease) of cervical spine 02/25/2016  . Osteoarthritis of multiple joints 02/25/2016  . Hiatal hernia 02/25/2016  . CKD stage 3 due to type 2 diabetes mellitus (HCC) 02/25/2016  . Chronic kidney disease, unspecified 09/05/2013  . Benign hypertension with CKD (chronic kidney disease) stage III (HCC) 09/05/2013  . CMC arthritis, thumb, degenerative 06/15/2013  . Onychomycosis due to dermatophyte 05/25/2013  . Acquired keratoderma 05/25/2013    Care Plan : General Social Work (Adult)  Updates made by Gustavus Bryant, LCSW since 02/21/2020 12:00 AM    Problem: Coping Skills (General Plan of Care)     Long-Range Goal: Coping Skills Enhanced   Start Date: 02/01/2020  Priority: Medium  Note:   Evidence-based guidance:   Acknowledge, normalize and validate difficulty of making life-long lifestyle changes.   Identify current effective and ineffective coping strategies.   Encourage patient and caregiver participation in care to increase self-esteem, confidence and feelings of control.   Consider alternative and complementary therapy approaches  such as meditation, mindfulness or yoga.   Encourage participation in cognitive behavioral therapy to foster a positive identity, increase self-awareness, as well as bolster self-esteem, confidence and self-efficacy.   Discuss spirituality; be present as concerns are identified; encourage journaling, prayer, worship services, meditation or pastoral counseling.   Encourage participation in pleasurable group activities such as hobbies, singing, sports or volunteering).   Encourage the use of mindfulness; refer for training or intensive intervention.   Consider the use of meditative  movement therapy such as tai chi, yoga or qigong.   Promote a regular daily exercise program based on tolerance, ability and patient choice to support positive thinking about disease or aging.   Notes:   Timeframe:  Long-Range Goal Priority:  Medium Start Date:  02/01/20                         Expected End Date:  04/20/20                    Follow Up Date - 90 days from 02/21/20   - begin personal counseling - call and visit an old friend - check out volunteer opportunities - join a support group - laugh; watch a funny movie or comedian - learn and use visualization or guided imagery - perform a random act of kindness - practice relaxation or meditation daily - start or continue a personal journal - talk about feelings with a friend, family or spiritual advisor - practice positive thinking and self-talk    Why is this important?    When you are stressed, down or upset, your body reacts too.   For example, your blood pressure may get higher; you may have a headache or stomachache.   When your emotions get the best of you, your body's ability to fight off cold and flu gets weak.   These steps will help you manage your emotions.    - begin a notebook of services in my neighborhood or community - call 211 when I need some help - follow-up on any referrals for help I am given - think ahead to make sure my need does not become an emergency - make a note about what I need to have by the phone or take with me, like an identification card or social security number have a back-up plan - have a back-up plan - make a list of family or friends that I can call -consider hiring in home support since having to discontinue services with last aide    Why is this important?   Knowing how and where to find help for yourself or family in your neighborhood and community is an important skill.  You will want to take some steps to learn how.    Notes: Son comes daily for several hours to check on  patient.   - check out options for in-home help, long-term care or hospice - complete a living will - discuss my treatment options with the doctor or nurse - do one enjoyable thing every day - do something different, like talking to a new person or going to a new place, every day - learn something new by asking, reading and searching the Internet every day - make an audio or video recording for my loved ones - make shared treatment decisions with doctor - meditate daily - name a health care proxy (decision maker) - share memories using a picture album or scrapbook with my loved ones -  spend time with a child every day, borrow one if I have to - spend time outdoors at least 3 times a week - strengthen or fix relationships with loved ones  -maintain healthy eating and drinking. Patient continues to lose weight.  -take cane and phone with her everywhere at all times -continue healthy socialization    Why is this important?   Having a long-term illness can be scary.  It can also be stressful for you and your caregiver.  These steps may help.    Current barriers:  Patient unable to consistently perform activities of daily living and needs additional assistance and support in order to meet this unmet need . Limited social support, ADL IADL limitations, Mental Health Concerns , Social Isolation, and Limited access to caregiver . Lacks social connections  Clinical Goals: Over the next 120 days, patient will gain additional education on in home support resources within her area  Over the next 120 days, patient will work with SW to address concerns related to anxiety management Interventions : . Assessed needs, level of care concerns, basic eligibility and provided education on available PCS resources. Patient is still in need of in home assistance since losing her last aide. Patient declines wanting any type of LTC placement.  . Patient's spouse passed last April and patient is struggling  with managing her care within the home.  . Son continues to come by daily to assist patient with her daily needs. . Patient reports that her main concern is transportation because her son is working Geneticist, molecular and doesn't get off work until 3:30 pm. CCM LCSW provided education on available transportation resources within her area. Patient has a new aide who comes twice per week and is able to provide stable transportation when needed. Aide has been providing transportation for patient to get her hair done. . Reviewed community support options ( CAP, private pay, PACE program) . 1:1 collaboration with PCP regarding development and update of comprehensive plan of care as evidenced by provider attestation and co-signature  . Provided emotional support regarding patient's recent fall which led to a broken elbow. Patient has an upcoming surgery next week to repair this.  . Patient interviewed and appropriate assessments performed . Discussed plans with patient for ongoing care management follow up and provided patient with direct contact information for care management team . Assisted patient/caregiver with obtaining information about health plan benefits . Provided education and assistance to client regarding Advanced Directives. . Provided education to patient/caregiver regarding level of care options. . Provided education to patient/caregiver about Hospice and/or Palliative Care services . Other interventions provided: Motivational Interviewing, Solution-Focused Strategies, and Brief CBT . Collaboration with PCP regarding development and update of comprehensive plan of care as evidenced by provider attestation and co-signature . Inter-disciplinary care team collaboration (see longitudinal plan of care) . Collaborated with appropriate clinical care team members regarding patient needs       Follow Up Plan: SW will follow up with patient by phone over the next quarter      Eula Fried, [place], MSW,  [email or web address] Phone: [phone]

## 2020-02-25 NOTE — Op Note (Addendum)
NAME: Lisa Mills MEDICAL RECORD RD:[id number] DATE OF BIRTH:January 01, 1930 FACILITY: [facility], [place]  OPERATIVE REPORT  DATE OF PROCEDURE:  02/12/2020  PREOPERATIVE DIAGNOSES:  SUBACUTE RIGHT SUPRACONDYLAR DISTAL HUMERUS FRACTURE.  POSTOPERATIVE DIAGNOSES:  SUBACUTE RIGHT SUPRACONDYLAR DISTAL HUMERUS FRACTURE.  PROCEDURE:   1. OPEN REDUCTION INTERNAL FIXATION OF RIGHT SUPRACONDYLAR HUMERUS FRACTURE WITHOUT INTERCONDYLAR EXTENSION 2. NEUROPLASTY OF THE ULNAR NERVE AT THE ELBOW 3. MANIPULATION UNDER ANESTHESIA RIGHT ELBOW  SURGEON:  Altamese [place], MD  ASSISTANT:  Ainsley Spinner, PA-C  ANESTHESIA:  General with regional block.  COMPLICATIONS:  None.  EBL: 50 mL.  TOURNIQUET:  None.  DISPOSITION:  To PACU.  CONDITION:  Stable.  BRIEF INDICATIONS FOR PROCEDURE:  This is a very pleasant 87 y.o. right-hand dominant patient who sustained a severe intercondylar humerus fracture in a ground-level fall. I discussed with the patient the risks and benefits of surgical treatment including the possibility of nerve injury, vessel injury, DVT, PE, loss of motion, nonunion, malunion, symptomatic hardware, need for further surgery, heart attack, stroke, death, and multiple others. These risks were acknowledged and consent provided to proceed.    BRIEF SUMMARY OF PROCEDURE:  The patient was taken to the operating room after administration of preoperative antibiotics.  The patient was placed laterally with prominences appropriately padded and the operative upper extremity placed carefully over bone foam. Chlorhexidine wash followed by Betadine scrub and paint were used then a standard drape. A tourniquet was placed about the arm. Time out was called. The arm was then elevated and exsanguinated with an Esmarch bandage.  We decided to perform a posterior approach with mobilization of the triceps without an olecranon osteotomy.  A posterior incision was made.  Dissection carried carefully  down, with hemostasis using electrocautery.  The triceps was mobilized medially after identification and neuroplasty of the ulnar nerve which was carefully retracted with a quarter inch penrose and facilitated with the bipolar bovie. Tethering fascia was released to allow the nerve to come out of the cubital tunnel up to and through the intermuscular septum proximally. Then an additional lateral incision in the triceps fascia was made, and this facilitated  mobilization laterally.  On the lateral side once this interval had been established, we placed a lap sponge underneath the triceps, mobilizing it, and enabling direct exposure of the fracture site.  The fracture was again an intercondylar split with significant comminution on the lateral side.  Bone quality was reduced as anticipated.  I did attempt multiple tenaculum placements and pinning in order to secure reduction but was unable to do this anatomically without the assistance of a mini frag plate, which was positioned to secure the primary fragment using 3 screws.  Once this had been established, I was then able to use the Acumed medial and lateral column plate and obtained outstanding reduction and compression across the articular surface and fixation with a mixture of standard and locked screw fixation.  Ainsley Spinner, PA-C, was present and assisting throughout.  We were able to take the elbow through a full range of motion with no complications.  Final images showed appropriate reduction, hardware placement, trajectory, and length.  Because of the subacute presentation and delay to surgery, excessive tightness and loss of motion was noted. Following fixation a gentle but consistent manipulation of the right elbow was performed to gain additional extension and flexion. Of note, our assistant was absolutely necessary for control during this very difficult reduction and assistance with maintenance during both provisional and definitive  fixation.  Furthermore,  he protected the ulnar nerve throughout the instrumentation. A bulky dressing without splint was applied and the patient taken to the PACU in stable condition.  PROGNOSIS: Lisa Mills will begin gentle PROM, AAROM of the elbow with AROM of the wrist and digits. Ice and elevation with "the hand of above the elbow and the elbow above the heart." Follow  up in the office in 10-14 days for removal of sutures and ROM advancement with therapy.  Altamese [place], MD

## 2020-02-29 ENCOUNTER — Telehealth: Payer: PPO

## 2020-03-05 ENCOUNTER — Telehealth: Payer: Self-pay

## 2020-03-05 ENCOUNTER — Other Ambulatory Visit: Payer: Self-pay | Admitting: Family Medicine

## 2020-03-05 DIAGNOSIS — F418 Other specified anxiety disorders: Secondary | ICD-10-CM

## 2020-03-05 NOTE — Telephone Encounter (Signed)
204 pm.  Phone call made to patient to complete a telephonic visit.  No answer but a HIPPA compliant message has been left requesting a call back.  PLAN: Awaiting call back. If no call back, Palliative Care will out reach patient at a later time.

## 2020-03-05 NOTE — Telephone Encounter (Signed)
210 pm.  Incoming call received from patient who is returning my call.  Patient states she is doing well and will see Dr. Marcelino Scot tomorrow.  She is hopeful that her cast will be removed.  She is not receiving HH therapy at this time.  Patient is hopeful on tomorrow's visit she will have more answers on [facility] therapy.  Patient states she is not monitoring her blood sugars at this time.  She states she is unable to do this due to the cast on her right hand.  Patient notes she is out of Glyburide and will need a refill.  Advised that I do not see this listed on her medication profile and that I would follow up with her PCP.   Patient states she checked her blood sugars just before her surgery last month and it was running in the 150's and 160's.  I have encouraged her to start checking blood sugars once her cast is removed hopefully tomorrow.  If blood sugars remain elevated she will contact her PCP.  Patient states she is sleeping well.  She typically awakens every couple of hours to void and will occasionally eat a small snack before going back to bed.  Patient denies any falls since her surgery.  She states she is being very careful in the home and that her son is coming in daily to assist her.    Phone call made to PCP office and spoke with Marjory Lies. Glyburide was discontinued on 08/31/19 by K. Tamala Julian, NP.  Patient is not taking any diabetic medications at this time.  Return call made to patient @ 227 pm and advised of above.

## 2020-03-06 DIAGNOSIS — S42411D Displaced simple supracondylar fracture without intercondylar fracture of right humerus, subsequent encounter for fracture with routine healing: Secondary | ICD-10-CM | POA: Diagnosis not present

## 2020-03-18 ENCOUNTER — Other Ambulatory Visit: Payer: Self-pay | Admitting: Family Medicine

## 2020-03-18 DIAGNOSIS — E1169 Type 2 diabetes mellitus with other specified complication: Secondary | ICD-10-CM

## 2020-03-18 DIAGNOSIS — E785 Hyperlipidemia, unspecified: Secondary | ICD-10-CM

## 2020-03-18 NOTE — Telephone Encounter (Signed)
Requested medication (s) are due for refill today: expired medications  Requested medication (s) are on the active medication list: yes  Last refill:  01/19/2019 #90 0 refills  Future visit scheduled: yes in 1 week  Notes to clinic:  expired medication, do you want to renew Rx?     Requested Prescriptions  Pending Prescriptions Disp Refills   simvastatin (ZOCOR) 40 MG tablet [Pharmacy Med Name: SIMVASTATIN 40 MG TAB] 90 tablet 3    Sig: TAKE 1 TABLET BY MOUTH AT BEDTIME      Cardiovascular:  Antilipid - Statins Failed - 03/18/2020  4:04 PM      Failed - Triglycerides in normal range and within 360 days    Triglycerides  Date Value Ref Range Status  09/06/2019 173 (H) <150 mg/dL Final          Passed - Total Cholesterol in normal range and within 360 days    Cholesterol  Date Value Ref Range Status  09/06/2019 168 <200 mg/dL Final          Passed - LDL in normal range and within 360 days    LDL Cholesterol (Calc)  Date Value Ref Range Status  09/06/2019 75 mg/dL (calc) Final    Comment:    Reference range: <100 . Desirable range <100 mg/dL for primary prevention;   <70 mg/dL for patients with CHD or diabetic patients  with > or = 2 CHD risk factors. Marland Kitchen LDL-C is now calculated using the Martin-Hopkins  calculation, which is a validated novel method providing  better accuracy than the Friedewald equation in the  estimation of LDL-C.  Cresenciano Genre et al. Annamaria Helling. 8676;195(09): 2061-2068  (http://education.[facility].com/faq/FAQ164)           Passed - HDL in normal range and within 360 days    HDL  Date Value Ref Range Status  09/06/2019 66 > OR = 50 mg/dL Final          Passed - Patient is not pregnant      Passed - Valid encounter within last 12 months    Recent Outpatient Visits           2 months ago Recurrent falls   [place], DO   5 months ago Psychophysiological insomnia   [place], DO   5 months ago Psychophysiological insomnia   [facility], Lupita Raider, FNP   6 months ago Acute cystitis without hematuria   [place], DO   6 months ago Controlled type 2 diabetes mellitus with diabetic nephropathy, without long-term current use of insulin ([place])   [facility] [place], Devonne Doughty, DO       Future Appointments             In 1 week [place], Devonne Doughty, DO [facility], [place]   In 8 months MacDiarmid, Nicki Reaper, MD [place]   In 8 months  [facility], [facility]

## 2020-03-21 ENCOUNTER — Telehealth: Payer: PPO | Admitting: General Practice

## 2020-03-21 ENCOUNTER — Ambulatory Visit (INDEPENDENT_AMBULATORY_CARE_PROVIDER_SITE_OTHER): Payer: PPO | Admitting: General Practice

## 2020-03-21 DIAGNOSIS — N184 Chronic kidney disease, stage 4 (severe): Secondary | ICD-10-CM

## 2020-03-21 DIAGNOSIS — E1169 Type 2 diabetes mellitus with other specified complication: Secondary | ICD-10-CM | POA: Diagnosis not present

## 2020-03-21 DIAGNOSIS — I10 Essential (primary) hypertension: Secondary | ICD-10-CM | POA: Diagnosis not present

## 2020-03-21 DIAGNOSIS — D631 Anemia in chronic kidney disease: Secondary | ICD-10-CM | POA: Diagnosis not present

## 2020-03-21 DIAGNOSIS — I129 Hypertensive chronic kidney disease with stage 1 through stage 4 chronic kidney disease, or unspecified chronic kidney disease: Secondary | ICD-10-CM | POA: Diagnosis not present

## 2020-03-21 DIAGNOSIS — E785 Hyperlipidemia, unspecified: Secondary | ICD-10-CM | POA: Diagnosis not present

## 2020-03-21 DIAGNOSIS — E1122 Type 2 diabetes mellitus with diabetic chronic kidney disease: Secondary | ICD-10-CM | POA: Diagnosis not present

## 2020-03-21 DIAGNOSIS — S42401A Unspecified fracture of lower end of right humerus, initial encounter for closed fracture: Secondary | ICD-10-CM

## 2020-03-21 DIAGNOSIS — N183 Chronic kidney disease, stage 3 unspecified: Secondary | ICD-10-CM

## 2020-03-21 DIAGNOSIS — N1831 Chronic kidney disease, stage 3a: Secondary | ICD-10-CM

## 2020-03-21 DIAGNOSIS — R296 Repeated falls: Secondary | ICD-10-CM

## 2020-03-21 DIAGNOSIS — W19XXXD Unspecified fall, subsequent encounter: Secondary | ICD-10-CM

## 2020-03-21 NOTE — Patient Instructions (Signed)
Visit Information  PATIENT GOALS: Patient Care Plan: General Social Work (Adult)    Problem Identified: Coping Skills (General Plan of Care)     Long-Range Goal: Coping Skills Enhanced   Start Date: 02/01/2020  Priority: Medium  Note:   Evidence-based guidance:   Acknowledge, normalize and validate difficulty of making life-long lifestyle changes.   Identify current effective and ineffective coping strategies.   Encourage patient and caregiver participation in care to increase self-esteem, confidence and feelings of control.   Consider alternative and complementary therapy approaches such as meditation, mindfulness or yoga.   Encourage participation in cognitive behavioral therapy to foster a positive identity, increase self-awareness, as well as bolster self-esteem, confidence and self-efficacy.   Discuss spirituality; be present as concerns are identified; encourage journaling, prayer, worship services, meditation or pastoral counseling.   Encourage participation in pleasurable group activities such as hobbies, singing, sports or volunteering).   Encourage the use of mindfulness; refer for training or intensive intervention.   Consider the use of meditative movement therapy such as tai chi, yoga or qigong.   Promote a regular daily exercise program based on tolerance, ability and patient choice to support positive thinking about disease or aging.   Notes:   Timeframe:  Long-Range Goal Priority:  Medium Start Date:  02/01/20                         Expected End Date:  04/20/20                    Follow Up Date - 90 days from 02/21/20   - begin personal counseling - call and visit an old friend - check out volunteer opportunities - join a support group - laugh; watch a funny movie or comedian - learn and use visualization or guided imagery - perform a random act of kindness - practice relaxation or meditation daily - start or continue a personal journal - talk about  feelings with a friend, family or spiritual advisor - practice positive thinking and self-talk    Why is this important?    When you are stressed, down or upset, your body reacts too.   For example, your blood pressure may get higher; you may have a headache or stomachache.   When your emotions get the best of you, your body's ability to fight off cold and flu gets weak.   These steps will help you manage your emotions.    - begin a notebook of services in my neighborhood or community - call 211 when I need some help - follow-up on any referrals for help I am given - think ahead to make sure my need does not become an emergency - make a note about what I need to have by the phone or take with me, like an identification card or social security number have a back-up plan - have a back-up plan - make a list of family or friends that I can call -consider hiring in home support since having to discontinue services with last aide    Why is this important?   Knowing how and where to find help for yourself or family in your neighborhood and community is an important skill.  You will want to take some steps to learn how.    Notes: Son comes daily for several hours to check on patient.   - check out options for in-home help, long-term care or hospice - complete a living  will - discuss my treatment options with the doctor or nurse - do one enjoyable thing every day - do something different, like talking to a new person or going to a new place, every day - learn something new by asking, reading and searching the Internet every day - make an audio or video recording for my loved ones - make shared treatment decisions with doctor - meditate daily - name a health care proxy (decision maker) - share memories using a picture album or scrapbook with my loved ones - spend time with a child every day, borrow one if I have to - spend time outdoors at least 3 times a week - strengthen or fix  relationships with loved ones  -maintain healthy eating and drinking. Patient continues to lose weight.  -take cane and phone with her everywhere at all times -continue healthy socialization    Why is this important?   Having a long-term illness can be scary.  It can also be stressful for you and your caregiver.  These steps may help.    Current barriers:  Patient unable to consistently perform activities of daily living and needs additional assistance and support in order to meet this unmet need . Limited social support, ADL IADL limitations, Mental Health Concerns , Social Isolation, and Limited access to caregiver . Lacks social connections  Clinical Goals: Over the next 120 days, patient will gain additional education on in home support resources within her area  Over the next 120 days, patient will work with SW to address concerns related to anxiety management Interventions : . Assessed needs, level of care concerns, basic eligibility and provided education on available PCS resources. Patient is still in need of in home assistance since losing her last aide. Patient declines wanting any type of LTC placement.  . Patient's spouse passed last April and patient is struggling with managing her care within the home.  . Son continues to come by daily to assist patient with her daily needs. . Patient reports that her main concern is transportation because her son is working Geneticist, molecular and doesn't get off work until 3:30 pm. CCM LCSW provided education on available transportation resources within her area. Patient has a new aide who comes twice per week and is able to provide stable transportation when needed. Aide has been providing transportation for patient to get her hair done. . Reviewed community support options ( CAP, private pay, PACE program) . 1:1 collaboration with PCP regarding development and update of comprehensive plan of care as evidenced by provider attestation and co-signature   . Provided emotional support regarding patient's recent fall which led to a broken elbow. Patient has an upcoming surgery next week to repair this.  . Patient interviewed and appropriate assessments performed . Discussed plans with patient for ongoing care management follow up and provided patient with direct contact information for care management team . Assisted patient/caregiver with obtaining information about health plan benefits . Provided education and assistance to client regarding Advanced Directives. . Provided education to patient/caregiver regarding level of care options. . Provided education to patient/caregiver about Hospice and/or Palliative Care services . Other interventions provided: Motivational Interviewing, Solution-Focused Strategies, and Brief CBT . Collaboration with PCP regarding development and update of comprehensive plan of care as evidenced by provider attestation and co-signature . Inter-disciplinary care team collaboration (see longitudinal plan of care) . Collaborated with appropriate clinical care team members regarding patient needs    Task: Support Psychosocial Response to Risk or Actual  Health Condition   Note:   Care Management Activities:    - active listening utilized - counseling provided - current coping strategies identified - decision-making supported - healthy lifestyle promoted - journaling promoted - meditative movement therapy encouraged - mindfulness encouraged - participation in counseling encouraged - problem-solving facilitated - relaxation techniques promoted - self-reflection promoted - spiritual activities promoted - verbalization of feelings encouraged    Notes:    Patient Care Plan: RNCM: Fall Risk (Adult)    Problem Identified: RNCM: Fall Risk   Priority: High    Long-Range Goal: Absence of Fall and Fall-Related Injury   Priority: High  Note:   Current Barriers:  Marland Kitchen Knowledge Deficits related to fall precautions in  patient with multiple chronic conditions.  . Decreased adherence to prescribed treatment for fall prevention . Unable to independently manage falls as evidence of frequent falls in the home . Lacks social connections . Does not contact provider office for questions/concerns . Chronic Disease Management support and education needs related to falls prevention in patient with impaired gait and mobility  . Lacks caregiver support.  Clinical Goal(s):  . patient will demonstrate improved adherence to prescribed treatment plan for decreasing falls as evidenced by patient reporting and review of EMR . patient will verbalize using fall risk reduction strategies discussed . patient will not experience additional falls . patient will verbalize understanding of plan for safety in her home and preventing falls . patient will work with [facility] and pcp to address needs related to falls prevention and safety in the home . patient will attend all scheduled medical appointments: 05-23-2020 at 0945 am Interventions:  . Collaboration with Olin Hauser, DO regarding development and update of comprehensive plan of care as evidenced by provider attestation and co-signature . Inter-disciplinary care team collaboration (see longitudinal plan of care) . Provided written and verbal education re: Potential causes of falls and Fall prevention strategies . Reviewed medications and discussed potential side effects of medications such as dizziness and frequent urination . Assessed for s/s of orthostatic hypotension . Assessed for falls since last encounter. . Assessed patients knowledge of fall risk prevention secondary to previously provided education. . Assessed working status of life alert bracelet and patient adherence . Provided patient information for fall alert systems . Evaluation of current treatment plan related to fall prevention and safety  and patient's adherence to plan as established by  provider. . Advised patient to call the office for new concerns, new falls, or questions  . Provided education to patient re: fall prevention and safety in the home . Provided patient with fall prevention and safety  educational materials related to prevention of falls in the home by my chart platform . Reviewed scheduled/upcoming provider appointments including: 03-25-2020 at 4 pm . Discussed plans with patient for ongoing care management follow up and provided patient with direct contact information for care management team Self-Care Deficits:  Unable to independently manage falls prevention and safety as evidence of fall with fracture to right humerus with repair on 02-13-2020 Lacks social connections Does not contact provider office for questions/concerns Patient Goals:  - Utilize walker and Cane(assistive device) appropriately with all ambulation - De-clutter walkways - Change positions slowly - Wear secure fitting shoes at all times with ambulation - Utilize home lighting for dim lit areas - Demonstrate self and pet awareness at all times - activities of daily living skills assessed - assistive or adaptive device use encouraged - barriers to physical activity or exercise addressed -  barriers to physical activity or exercise identified - barriers to safety identified - cognition assessed - cognitive-stimulating activities promoted - fall prevention plan reviewed and updated - fear of falling, loss of independence and pain acknowledged - medication list reviewed - modification of home and work environment promoted - participation in rehabilitation therapy encouraged Follow Up Plan: Telephone follow up appointment with care management team member scheduled for: 05-23-2020 at 0945 am   Task: RNCM: Identify and Manage Contributors to Fall Risk   Note:   Care Management Activities:    - activities of daily living skills assessed - assistive or adaptive device use encouraged - barriers  to physical activity or exercise addressed - barriers to physical activity or exercise identified - barriers to safety identified - cognition assessed - cognitive-stimulating activities promoted - fall prevention plan reviewed and updated - fear of falling, loss of independence and pain acknowledged - medication list reviewed - modification of home and work environment promoted - participation in rehabilitation therapy encouraged       Patient Care Plan: RNCM: Hypertension (Adult)    Problem Identified: RNCM: Hypertension (Hypertension)   Priority: Medium    Long-Range Goal: RNCM: Hypertension Monitored   Priority: Medium  Note:   Objective:  . Last practice recorded BP readings:  BP Readings from Last 3 Encounters:  02/13/20 (!) 175/82  01/04/20 (!) 142/68  11/27/19 119/72 .   Marland Kitchen Most recent eGFR/CrCl: No results found for: EGFR  No components found for: CRCL Current Barriers:  Marland Kitchen Knowledge Deficits related to basic understanding of hypertension pathophysiology and self care management . Knowledge Deficits related to understanding of medications prescribed for management of hypertension . Limited Social Support . Lacks social connections . Does not contact provider office for questions/concerns Case Manager Clinical Goal(s):  Marland Kitchen Over the next 120 days, patient will verbalize understanding of plan for hypertension management . Over the next 120 days, patient will attend all scheduled medical appointments: 03-25-2020 at 4 pm . Over the next 120 days, patient will demonstrate improved adherence to prescribed treatment plan for hypertension as evidenced by taking all medications as prescribed, monitoring and recording blood pressure as directed, adhering to low sodium/DASH diet . Over the next 120 days, patient will demonstrate improved health management independence as evidenced by checking blood pressure as directed and notifying PCP if SBP>160 or DBP > 90, taking all medications as  prescribe, and adhering to a low sodium diet as discussed. . Over the next 120 days, patient will verbalize basic understanding of hypertension disease process and self health management plan as evidenced by compliance with medications, compliance with dietary restrictions, and working with the CCM team to manage health and well being  Interventions:  . Collaboration with Olin Hauser, DO regarding development and update of comprehensive plan of care as evidenced by provider attestation and co-signature . Inter-disciplinary care team collaboration (see longitudinal plan of care) . Evaluation of current treatment plan related to hypertension self management and patient's adherence to plan as established by provider. . Provided education to patient re: stroke prevention, s/s of heart attack and stroke, DASH diet, complications of uncontrolled blood pressure . Reviewed medications with patient and discussed importance of compliance . Discussed plans with patient for ongoing care management follow up and provided patient with direct contact information for care management team . Advised patient, providing education and rationale, to monitor blood pressure daily and record, calling PCP for findings outside established parameters.  . Reviewed scheduled/upcoming provider appointments including:  03-25-2020 at 4 pm Patient Goals: - blood pressure trends reviewed - depression screen reviewed - home or ambulatory blood pressure monitoring encouraged Self-Care Activities: - Self administers medications as prescribed Attends all scheduled provider appointments Calls provider office for new concerns, questions, or BP outside discussed parameters Checks BP and records as discussed Follows a low sodium diet/DASH diet Follow Up Plan: Telephone follow up appointment with care management team member scheduled for: 05-23-2020 at 0945 am   Task: RNCM: Identify and Monitor Blood Pressure Elevation   Note:    Care Management Activities:    - blood pressure trends reviewed - depression screen reviewed - home or ambulatory blood pressure monitoring encouraged    Notes:    Patient Care Plan: RNCM: Diabetes Type 2 (Adult)    Problem Identified: RNCM: Glycemic Management (Diabetes, Type 2)   Priority: Medium    Long-Range Goal: RNCM: Glycemic Management Optimized   Note:   Objective:  Lab Results  Component Value Date   HGBA1C 6.6 (H) 09/06/2019 .   Lab Results  Component Value Date   CREATININE 0.93 02/13/2020   CREATININE 0.94 (H) 09/06/2019   CREATININE 1.23 (H) 08/08/2019 .   Marland Kitchen No results found for: EGFR Current Barriers:  Marland Kitchen Knowledge Deficits related to basic Diabetes pathophysiology and self care/management . Knowledge Deficits related to medications used for management of diabetes- is not currently taking medications for diabetes  . Does not use cbg meter  . Limited Social Support . Lacks social connections . Does not contact provider office for questions/concerns Case Manager Clinical Goal(s):  . patient will demonstrate improved adherence to prescribed treatment plan for diabetes self care/management as evidenced by: daily monitoring and recording of CBG  adherence to ADA/ carb modified diet exercise 3/4 days/week adherence to prescribed medication regimen contacting provider for new or worsened symptoms or questions Interventions:  . Collaboration with [name], DO regarding development and update of comprehensive plan of care as evidenced by provider attestation and co-signature . Inter-disciplinary care team collaboration (see longitudinal plan of care) . Provided education to patient about basic DM disease process . Discussed plans with patient for ongoing care management follow up and provided patient with direct contact information for care management team . Reviewed scheduled/upcoming provider appointments including: 03-25-2020 at 4 pm . Review of  patient status, including review of consultants reports, relevant laboratory and other test results, and medications completed. Self-Care Activities - Attends all scheduled provider appointments Checks blood sugars as prescribed and utilize hyper and hypoglycemia protocol as needed Adheres to prescribed ADA/carb modified Patient Goals: -barriers to adherence to treatment plan identified - blood glucose monitoring encouraged - blood glucose readings reviewed - resources required to improve adherence to care identified - self-awareness of signs/symptoms of hypo or hyperglycemia encouraged  Follow Up Plan: Telephone follow up appointment with care management team member scheduled for: 05-23-2020 at 0945 am   Task: RNCM: Alleviate Barriers to Glycemic Management   Note:   Care Management Activities:    - barriers to adherence to treatment plan identified - blood glucose monitoring encouraged - blood glucose readings reviewed - resources required to improve adherence to care identified - self-awareness of signs/symptoms of hypo or hyperglycemia encouraged       Patient Care Plan: RNCM: General Plan of Care (Adult)    Problem Identified: RNCM: HLD Management   Priority: Medium    Long-Range Goal: RNCM: HLD Management   Priority: Medium  Note:   Current Barriers:  .  Poorly controlled hyperlipidemia, complicated by DM and HTN . Current antihyperlipidemic regimen: zocor 40 mg QD . Most recent lipid panel:     Component Value Date/Time   CHOL 168 09/06/2019 0808   TRIG 173 (H) 09/06/2019 0808   HDL 66 09/06/2019 0808   CHOLHDL 2.5 09/06/2019 0808   VLDL 26 08/19/2016 0802   LDLCALC 75 09/06/2019 0808 .   Marland Kitchen ASCVD risk enhancing conditions: age >44, DM, HTN, CKD . Lacks social connections . Does not contact provider office for questions/concerns RN Care Manager Clinical Goal(s):  . patient will work with Consulting civil engineer, providers, and care team towards execution of optimized  self-health management plan . patient will verbalize understanding of plan for effective management of HLD  . patient will work with [facility] and pcp  to address needs related to management of HLD  . patient will attend all scheduled medical appointments: 03-25-2020 at 4 pm Interventions: . Collaboration with Olin Hauser, DO regarding development and update of comprehensive plan of care as evidenced by provider attestation and co-signature . Inter-disciplinary care team collaboration (see longitudinal plan of care) . Medication review performed; medication list updated in electronic medical record.  Bertram Savin care team collaboration (see longitudinal plan of care) . Referred to pharmacy team for assistance with HLD medication management . Evaluation of current treatment plan related to HLD  and patient's adherence to plan as established by provider. . Advised patient to call the office for changes or questions . Provided education to patient re: heart healthy/ADA diet  . Reviewed scheduled/upcoming provider appointments including: 03-25-2020 at 0945 am . Discussed plans with patient for ongoing care management follow up and provided patient with direct contact information for care management team Patient Goals/Self-Care Activities: - call for medicine refill 2 or 3 days before it runs out - call if I am sick and can't take my medicine - keep a list of all the medicines I take; vitamins and herbals too - learn to read medicine labels - use a pillbox to sort medicine - use an alarm clock or phone to remind me to take my medicine - change to whole grain breads, cereal, pasta - drink 6 to 8 glasses of water each day - eat 3 to 5 servings of fruits and vegetables each day - eat 5 or 6 small meals each day - limit fast food meals to no more than 1 per week - manage portion size - prepare main meal at home 3 to 5 days each week - read food labels for fat, fiber, carbohydrates  and portion size - be open to making changes - I can manage, know and watch for signs of a heart attack - if I have chest pain, call for help - learn about small changes that will make a big difference - learn my personal risk factors - barriers to meeting goals identified - change-talk evoked - choices provided - collaboration with team encouraged - decision-making supported - difficulty of making life-long changes acknowledged - health risks reviewed - problem-solving facilitated - questions answered - readiness for change evaluated - reassurance provided - self-reflection promoted - self-reliance encouraged - verbalization of feelings encouraged Follow Up Plan: Telephone follow up appointment with care management team member scheduled for: 05-23-2020 at 0945 am     Task: RNCM: Mutually Develop and Royce Macadamia Achievement of Patient Goals   Note:   Care Management Activities:    - barriers to meeting goals identified - change-talk evoked - choices  provided - collaboration with team encouraged - decision-making supported - difficulty of making life-long changes acknowledged - health risks reviewed - problem-solving facilitated - questions answered - readiness for change evaluated - reassurance provided - self-reflection promoted - self-reliance encouraged - verbalization of feelings encouraged         Patient verbalizes understanding of instructions provided today and agrees to view in [place].   Telephone follow up appointment with care management team member scheduled for: 05-23-2020 sy 0945 am  Noreene Larsson RN, MSN, [place] Mobile: [phone]

## 2020-03-21 NOTE — Chronic Care Management (AMB) (Signed)
Chronic Care Management   CCM RN Visit Note  03/21/2020 Name: Lisa Mills MRN: [id number] DOB: [birth year]  Subjective: Lisa Mills is a 87 y.o. year old female who is a primary care patient of Olin Hauser, DO. The care management team was consulted for assistance with disease management and care coordination needs.    Engaged with patient by telephone for follow up visit in response to provider referral for case management and/or care coordination services.   Consent to Services:  The patient was given information about Chronic Care Management services, agreed to services, and gave verbal consent prior to initiation of services.  Please see initial visit note for detailed documentation.   Patient agreed to services and verbal consent obtained.   Assessment: Review of patient past medical history, allergies, medications, health status, including review of consultants reports, laboratory and other test data, was performed as part of comprehensive evaluation and provision of chronic care management services.   SDOH (Social Determinants of Health) assessments and interventions performed:    CCM Care Plan  Allergies  Allergen Reactions   Aspirin Other (See Comments)    Burns stomach   Conray [Iothalamate] Hives    IV dye conray-400   Dye Fdc Red [Red Dye] Hives   Sulfa Antibiotics     Unknown Reaction, not used in years   Prednisone     Indigestion     Outpatient Encounter Medications as of 03/21/2020  Medication Sig   acetaminophen (TYLENOL) 500 MG tablet Take 1 tablet (500 mg total) by mouth every 12 (twelve) hours.   Blood Glucose Monitoring Suppl ([place]) w/Device KIT Use glucometer to check blood sugar daily. (Patient not taking: Reported on 01/10/2020)   Cholecalciferol (VITAMIN D) 50 MCG (2000 UT) CAPS Take 2,000 Units by mouth daily.    escitalopram (LEXAPRO) 5 MG tablet TAKE 1 TABLET BY MOUTH ONCE DAILY WITH FOOD   ferrous  sulfate 325 (65 FE) MG tablet Take 325 mg by mouth daily.    glucose blood (ONE TOUCH ULTRA TEST) test strip CHECK BLOOD SUGAR UP TO 2 TIMES A DAY.   glycerin adult 2 g suppository Place 1 suppository rectally as needed for constipation.   Histamine Dihydrochloride (AUSTRALIAN DREAM ARTHRITIS) 0.025 % CREA Apply 1 application topically daily as needed (Pain).   HYDROcodone-acetaminophen (NORCO) 5-325 MG tablet Take 1-2 tablets by mouth every 6 (six) hours as needed for moderate pain or severe pain.   hydrocortisone (ANUSOL-HC) 2.5 % rectal cream USE 1 APPLICATION RECTALLY TWICE DAILY AS DIRECTED (Patient taking differently: Place 1 application rectally daily as needed for hemorrhoids or anal itching.)   levothyroxine (SYNTHROID) 25 MCG tablet TAKE 1 TABLET BY MOUTH ONCE DAILY ON AN EMPTY STOMACH. WAIT 30 MINUTES BEFORE TAKING OTHER MEDS. (Patient taking differently: Take 25 mcg by mouth daily before breakfast. AN EMPTY STOMACH. WAIT 30 MINUTES BEFORE TAKING OTHER MEDS.)   lisinopril (ZESTRIL) 5 MG tablet Take 5 mg by mouth daily.   nitrofurantoin (MACRODANTIN) 100 MG capsule Take 1 capsule (100 mg total) by mouth daily.   omeprazole (PRILOSEC) 20 MG capsule Take 20 mg by mouth daily before breakfast.   OneTouch Delica Lancets 20E MISC 1 each by Does not apply route 3 (three) times daily. Dx:E11.9   polyethylene glycol (MIRALAX / GLYCOLAX) 17 g packet Take 17 g by mouth daily as needed for moderate constipation or severe constipation. (Patient taking differently: Take 17 g by mouth daily.)   senna (SENOKOT) 8.6  MG TABS tablet Take 1 tablet (8.6 mg total) by mouth 2 (two) times daily. (Patient taking differently: Take 2 tablets by mouth at bedtime.)   simvastatin (ZOCOR) 40 MG tablet Take 1 tablet (40 mg total) by mouth at bedtime.   traZODone (DESYREL) 50 MG tablet Take 0.5-1 tablets (25-50 mg total) by mouth at bedtime as needed for sleep. (Patient taking differently: Take 25 mg by mouth  at bedtime as needed for sleep.)   witch hazel-glycerin (TUCKS) pad Apply 1 application topically as needed for itching.   No facility-administered encounter medications on file as of 03/21/2020.    Patient Active Problem List   Diagnosis Date Noted   Psychophysiological insomnia 10/06/2019   Nonintractable headache 10/06/2019   Urinary incontinence 10/06/2019   Internal and external bleeding hemorrhoids 08/15/2019   Sacral pain    Radicular pain of sacrum 07/11/2019   Sacral fracture, closed ([place]) 07/04/2019   Fall 07/04/2019   Bilateral pubic rami fractures, sequela 07/04/2019   Background diabetic retinopathy ([place]) 01/23/2019   Schatzki's ring 03/04/2017   Post herpetic neuralgia 10/06/2016   Hx of colonic polyps 09/22/2016   Osteopenia 09/02/2016   Anxiety associated with depression 08/25/2016   Trapezius muscle spasm 05/25/2016   Intermittent left lower quadrant abdominal pain 03/16/2016   Anemia in chronic kidney disease (CKD) 02/26/2016   Type 2 diabetes mellitus with stage 4 chronic kidney disease ([place]) 02/25/2016   Hypothyroidism 02/25/2016   Constipation 02/25/2016   GERD (gastroesophageal reflux disease) 02/25/2016   Hyperlipidemia associated with type 2 diabetes mellitus ([place]) 02/25/2016   DJD (degenerative joint disease) of cervical spine 02/25/2016   Osteoarthritis of multiple joints 02/25/2016   Hiatal hernia 02/25/2016   CKD stage 3 due to type 2 diabetes mellitus ([place]) 02/25/2016   Chronic kidney disease, unspecified 09/05/2013   Benign hypertension with CKD (chronic kidney disease) stage III ([place]) 09/05/2013   CMC arthritis, thumb, degenerative 06/15/2013   Onychomycosis due to dermatophyte 05/25/2013   Acquired keratoderma 05/25/2013    Conditions to be addressed/monitored:HTN, HLD, DMII and Fall Precautions in a patient with frquent falls  Care Plan : RNCM: Fall Risk (Adult)  Updates made by Vanita Ingles since 03/21/2020  12:00 AM    Problem: RNCM: Fall Risk   Priority: High    Long-Range Goal: Absence of Fall and Fall-Related Injury   Priority: High  Note:   Current Barriers:   Knowledge Deficits related to fall precautions in patient with multiple chronic conditions.   Decreased adherence to prescribed treatment for fall prevention  Unable to independently manage falls as evidence of frequent falls in the home  Lacks social connections  Does not contact provider office for questions/concerns  Chronic Disease Management support and education needs related to falls prevention in patient with impaired gait and mobility   Lacks caregiver support.  Clinical Goal(s):   patient will demonstrate improved adherence to prescribed treatment plan for decreasing falls as evidenced by patient reporting and review of EMR  patient will verbalize using fall risk reduction strategies discussed  patient will not experience additional falls  patient will verbalize understanding of plan for safety in her home and preventing falls  patient will work with [facility] and pcp to address needs related to falls prevention and safety in the home  patient will attend all scheduled medical appointments: 05-23-2020 at 0945 am Interventions:   Collaboration with Olin Hauser, DO regarding development and update of comprehensive plan of care as evidenced by provider attestation  and co-signature  Inter-disciplinary care team collaboration (see longitudinal plan of care)  Provided written and verbal education re: Potential causes of falls and Fall prevention strategies  Reviewed medications and discussed potential side effects of medications such as dizziness and frequent urination  Assessed for s/s of orthostatic hypotension  Assessed for falls since last encounter.  Assessed patients knowledge of fall risk prevention secondary to previously provided education.  Assessed working status of life alert bracelet  and patient adherence  Provided patient information for fall alert systems  Evaluation of current treatment plan related to fall prevention and safety  and patient's adherence to plan as established by provider.  Advised patient to call the office for new concerns, new falls, or questions   Provided education to patient re: fall prevention and safety in the home  Provided patient with fall prevention and safety  educational materials related to prevention of falls in the home by my chart platform  Reviewed scheduled/upcoming provider appointments including: 03-25-2020 at 4 pm  Discussed plans with patient for ongoing care management follow up and provided patient with direct contact information for care management team Self-Care Deficits:  Unable to independently manage falls prevention and safety as evidence of fall with fracture to right humerus with repair on 02-13-2020 Lacks social connections Does not contact provider office for questions/concerns Patient Goals:  - Utilize walker and Cane(assistive device) appropriately with all ambulation - De-clutter walkways - Change positions slowly - Wear secure fitting shoes at all times with ambulation - Utilize home lighting for dim lit areas - Demonstrate self and pet awareness at all times - activities of daily living skills assessed - assistive or adaptive device use encouraged - barriers to physical activity or exercise addressed - barriers to physical activity or exercise identified - barriers to safety identified - cognition assessed - cognitive-stimulating activities promoted - fall prevention plan reviewed and updated - fear of falling, loss of independence and pain acknowledged - medication list reviewed - modification of home and work environment promoted - participation in rehabilitation therapy encouraged Follow Up Plan: Telephone follow up appointment with care management team member scheduled for: 05-23-2020 at 0945 am    Task: RNCM: Identify and Manage Contributors to Fall Risk   Note:   Care Management Activities:    - activities of daily living skills assessed - assistive or adaptive device use encouraged - barriers to physical activity or exercise addressed - barriers to physical activity or exercise identified - barriers to safety identified - cognition assessed - cognitive-stimulating activities promoted - fall prevention plan reviewed and updated - fear of falling, loss of independence and pain acknowledged - medication list reviewed - modification of home and work environment promoted - participation in rehabilitation therapy encouraged       Care Plan : RNCM: Hypertension (Adult)  Updates made by Vanita Ingles since 03/21/2020 12:00 AM    Problem: RNCM: Hypertension (Hypertension)   Priority: Medium    Long-Range Goal: RNCM: Hypertension Monitored   Priority: Medium  Note:   Objective:   Last practice recorded BP readings:  BP Readings from Last 3 Encounters:  02/13/20 (!) 175/82  01/04/20 (!) 142/68  11/27/19 119/72     Most recent eGFR/CrCl: No results found for: EGFR  No components found for: CRCL Current Barriers:   Knowledge Deficits related to basic understanding of hypertension pathophysiology and self care management  Knowledge Deficits related to understanding of medications prescribed for management of hypertension  Limited Social Support  Lacks social connections  Does not contact provider office for questions/concerns Case Manager Clinical Goal(s):   Over the next 120 days, patient will verbalize understanding of plan for hypertension management  Over the next 120 days, patient will attend all scheduled medical appointments: 03-25-2020 at 4 pm  Over the next 120 days, patient will demonstrate improved adherence to prescribed treatment plan for hypertension as evidenced by taking all medications as prescribed, monitoring and recording blood pressure as  directed, adhering to low sodium/DASH diet  Over the next 120 days, patient will demonstrate improved health management independence as evidenced by checking blood pressure as directed and notifying PCP if SBP>160 or DBP > 90, taking all medications as prescribe, and adhering to a low sodium diet as discussed.  Over the next 120 days, patient will verbalize basic understanding of hypertension disease process and self health management plan as evidenced by compliance with medications, compliance with dietary restrictions, and working with the CCM team to manage health and well being  Interventions:   Collaboration with Parks Ranger, Devonne Doughty, DO regarding development and update of comprehensive plan of care as evidenced by provider attestation and co-signature  Inter-disciplinary care team collaboration (see longitudinal plan of care)  Evaluation of current treatment plan related to hypertension self management and patient's adherence to plan as established by provider.  Provided education to patient re: stroke prevention, s/s of heart attack and stroke, DASH diet, complications of uncontrolled blood pressure  Reviewed medications with patient and discussed importance of compliance  Discussed plans with patient for ongoing care management follow up and provided patient with direct contact information for care management team  Advised patient, providing education and rationale, to monitor blood pressure daily and record, calling PCP for findings outside established parameters.   Reviewed scheduled/upcoming provider appointments including: 03-25-2020 at 4 pm Patient Goals: - blood pressure trends reviewed - depression screen reviewed - home or ambulatory blood pressure monitoring encouraged Self-Care Activities: - Self administers medications as prescribed Attends all scheduled provider appointments Calls provider office for new concerns, questions, or BP outside discussed  parameters Checks BP and records as discussed Follows a low sodium diet/DASH diet Follow Up Plan: Telephone follow up appointment with care management team member scheduled for: 05-23-2020 at 0945 am   Task: RNCM: Identify and Monitor Blood Pressure Elevation   Note:   Care Management Activities:    - blood pressure trends reviewed - depression screen reviewed - home or ambulatory blood pressure monitoring encouraged    Notes:    Care Plan : RNCM: Diabetes Type 2 (Adult)  Updates made by Vanita Ingles since 03/21/2020 12:00 AM    Problem: RNCM: Glycemic Management (Diabetes, Type 2)   Priority: Medium    Long-Range Goal: RNCM: Glycemic Management Optimized   Note:   Objective:  Lab Results  Component Value Date   HGBA1C 6.6 (H) 09/06/2019    Lab Results  Component Value Date   CREATININE 0.93 02/13/2020   CREATININE 0.94 (H) 09/06/2019   CREATININE 1.23 (H) 08/08/2019     No results found for: EGFR Current Barriers:   Knowledge Deficits related to basic Diabetes pathophysiology and self care/management  Knowledge Deficits related to medications used for management of diabetes- is not currently taking medications for diabetes   Does not use cbg meter   Limited Social Support  Lacks social connections  Does not contact provider office for questions/concerns Case Manager Clinical Goal(s):   patient will demonstrate improved adherence to prescribed treatment plan  for diabetes self care/management as evidenced by: daily monitoring and recording of CBG  adherence to ADA/ carb modified diet exercise 3/4 days/week adherence to prescribed medication regimen contacting provider for new or worsened symptoms or questions Interventions:   Collaboration with Parks Ranger, Devonne Doughty, DO regarding development and update of comprehensive plan of care as evidenced by provider attestation and co-signature  Inter-disciplinary care team collaboration (see longitudinal plan of  care)  Provided education to patient about basic DM disease process  Discussed plans with patient for ongoing care management follow up and provided patient with direct contact information for care management team  Reviewed scheduled/upcoming provider appointments including: 03-25-2020 at 4 pm  Review of patient status, including review of consultants reports, relevant laboratory and other test results, and medications completed. Self-Care Activities - Attends all scheduled provider appointments Checks blood sugars as prescribed and utilize hyper and hypoglycemia protocol as needed Adheres to prescribed ADA/carb modified Patient Goals: -barriers to adherence to treatment plan identified - blood glucose monitoring encouraged - blood glucose readings reviewed - resources required to improve adherence to care identified - self-awareness of signs/symptoms of hypo or hyperglycemia encouraged  Follow Up Plan: Telephone follow up appointment with care management team member scheduled for: 05-23-2020 at 0945 am   Task: RNCM: Alleviate Barriers to Glycemic Management   Note:   Care Management Activities:    - barriers to adherence to treatment plan identified - blood glucose monitoring encouraged - blood glucose readings reviewed - resources required to improve adherence to care identified - self-awareness of signs/symptoms of hypo or hyperglycemia encouraged       Care Plan : RNCM: General Plan of Care (Adult)  Updates made by Vanita Ingles since 03/21/2020 12:00 AM    Problem: RNCM: HLD Management   Priority: Medium    Long-Range Goal: RNCM: HLD Management   Priority: Medium  Note:   Current Barriers:   Poorly controlled hyperlipidemia, complicated by DM and HTN  Current antihyperlipidemic regimen: zocor 40 mg QD  Most recent lipid panel:     Component Value Date/Time   CHOL 168 09/06/2019 0808   TRIG 173 (H) 09/06/2019 0808   HDL 66 09/06/2019 0808   CHOLHDL 2.5  09/06/2019 0808   VLDL 26 08/19/2016 0802   LDLCALC 75 09/06/2019 0808     ASCVD risk enhancing conditions: age >1, DM, HTN, CKD  Lacks social connections  Does not contact provider office for questions/concerns RN Care Manager Clinical Goal(s):   patient will work with Consulting civil engineer, providers, and care team towards execution of optimized self-health management plan  patient will verbalize understanding of plan for effective management of HLD   patient will work with [facility] and pcp  to address needs related to management of HLD   patient will attend all scheduled medical appointments: 03-25-2020 at 4 pm Interventions:  Collaboration with Olin Hauser, DO regarding development and update of comprehensive plan of care as evidenced by provider attestation and co-signature  Inter-disciplinary care team collaboration (see longitudinal plan of care)  Medication review performed; medication list updated in electronic medical record.   Inter-disciplinary care team collaboration (see longitudinal plan of care)  Referred to pharmacy team for assistance with HLD medication management  Evaluation of current treatment plan related to HLD  and patient's adherence to plan as established by provider.  Advised patient to call the office for changes or questions  Provided education to patient re: heart healthy/ADA diet   Reviewed scheduled/upcoming provider appointments  including: 03-25-2020 at 0945 am  Discussed plans with patient for ongoing care management follow up and provided patient with direct contact information for care management team Patient Goals/Self-Care Activities: - call for medicine refill 2 or 3 days before it runs out - call if I am sick and can't take my medicine - keep a list of all the medicines I take; vitamins and herbals too - learn to read medicine labels - use a pillbox to sort medicine - use an alarm clock or phone to remind me to take my  medicine - change to whole grain breads, cereal, pasta - drink 6 to 8 glasses of water each day - eat 3 to 5 servings of fruits and vegetables each day - eat 5 or 6 small meals each day - limit fast food meals to no more than 1 per week - manage portion size - prepare main meal at home 3 to 5 days each week - read food labels for fat, fiber, carbohydrates and portion size - be open to making changes - I can manage, know and watch for signs of a heart attack - if I have chest pain, call for help - learn about small changes that will make a big difference - learn my personal risk factors - barriers to meeting goals identified - change-talk evoked - choices provided - collaboration with team encouraged - decision-making supported - difficulty of making life-long changes acknowledged - health risks reviewed - problem-solving facilitated - questions answered - readiness for change evaluated - reassurance provided - self-reflection promoted - self-reliance encouraged - verbalization of feelings encouraged Follow Up Plan: Telephone follow up appointment with care management team member scheduled for: 05-23-2020 at 0945 am     Task: RNCM: Mutually Develop and Royce Macadamia Achievement of Patient Goals   Note:   Care Management Activities:    - barriers to meeting goals identified - change-talk evoked - choices provided - collaboration with team encouraged - decision-making supported - difficulty of making life-long changes acknowledged - health risks reviewed - problem-solving facilitated - questions answered - readiness for change evaluated - reassurance provided - self-reflection promoted - self-reliance encouraged - verbalization of feelings encouraged         Plan:Telephone follow up appointment with care management team member scheduled for:  05-23-2020 at [place] am  Noreene Larsson RN, MSN, [place] Mobile: [phone]

## 2020-03-25 ENCOUNTER — Encounter: Payer: Self-pay | Admitting: Family Medicine

## 2020-03-25 ENCOUNTER — Ambulatory Visit (INDEPENDENT_AMBULATORY_CARE_PROVIDER_SITE_OTHER): Payer: PPO | Admitting: Family Medicine

## 2020-03-25 ENCOUNTER — Other Ambulatory Visit: Payer: Self-pay

## 2020-03-25 VITALS — BP 174/73 | HR 65 | Temp 97.8°F | Ht 59.0 in | Wt 106.0 lb

## 2020-03-25 DIAGNOSIS — M21331 Wrist drop, right wrist: Secondary | ICD-10-CM | POA: Diagnosis not present

## 2020-03-25 DIAGNOSIS — K5903 Drug induced constipation: Secondary | ICD-10-CM | POA: Diagnosis not present

## 2020-03-25 DIAGNOSIS — H6123 Impacted cerumen, bilateral: Secondary | ICD-10-CM | POA: Diagnosis not present

## 2020-03-25 DIAGNOSIS — N184 Chronic kidney disease, stage 4 (severe): Secondary | ICD-10-CM | POA: Diagnosis not present

## 2020-03-25 DIAGNOSIS — E1122 Type 2 diabetes mellitus with diabetic chronic kidney disease: Secondary | ICD-10-CM

## 2020-03-25 DIAGNOSIS — S42401D Unspecified fracture of lower end of right humerus, subsequent encounter for fracture with routine healing: Secondary | ICD-10-CM

## 2020-03-25 LAB — POCT GLYCOSYLATED HEMOGLOBIN (HGB A1C): Hemoglobin A1C: 8.3 % — AB (ref 4.0–5.6)

## 2020-03-25 NOTE — Progress Notes (Signed)
Subjective:    Patient ID: Lisa Mills, female    DOB: [birth year], 87 y.o.   MRN: [id number]  Lisa Mills is a 87 y.o. female presenting on 03/25/2020 for Diabetes (Discuss medications and physical therapy.), Cerumen Impaction (Pt feels her left ear more with wax then the right one. Once in a while she gets pain in her left ear. ), and Constipation (Would like to know what other pills beside laxatives can help, she also stated its been 3 days since she's been constipated.)   HPI   Cerumen Impaction, Bilateral Chronic problem wax build up. In past has had similar issue. She has hearing aid needs cleaned wax today  Elevated BP due to pain  Chronic pain daily - Right Wrist Drop Fall and R elbow fracture 01/2020, she had surgery ORIF 02/13/20 Dr handy Needs HH PT / OT - never able to get arranged after surgery due to lack of staffing with covid She is trying to do exercises at home  Type 2 Diabetes Due for A1c today, her range 6-7 range on home readings and glucose She remains off Glimepiride Admits not adhering to low sugar diet, eats ice cream, limits carbs tho with meals, family helps her with meal prep at times. No hypoglycemia  Constipation Chronic problem  drinks a lot of apple juice, eats prunes - Senna x 2 daily - Miralax x 1 capful -  Some improvement with x 2 capfuls but still can go a few days w/o BM   Depression screen [facility] 2/9 11/28/2019 09/14/2019 09/07/2019  Decreased Interest 0 0 0  Down, Depressed, Hopeless 0 0 0  PHQ - 2 Score 0 0 0  Altered sleeping - 3 -  Tired, decreased energy - 1 -  Change in appetite - 2 -  Feeling bad or failure about yourself  - 0 -  Trouble concentrating - 0 -  Moving slowly or fidgety/restless - 0 -  Suicidal thoughts - 0 -  PHQ-9 Score - 6 -  Difficult doing work/chores - Somewhat difficult -  Some recent data might be hidden    Social History   Tobacco Use  . Smoking status: Never Smoker  . Smokeless tobacco: Never Used   Vaping Use  . Vaping Use: Never used  Substance Use Topics  . Alcohol use: Not Currently  . Drug use: No    Review of Systems Per HPI unless specifically indicated above     Objective:    BP (!) 174/73 (BP Location: Left Arm, Patient Position: Sitting, Cuff Size: Normal)   Pulse 65   Temp 97.8 F (36.6 C) (Temporal)   Ht 4\' 11"  (1.499 m)   Wt 106 lb (48.1 kg)   LMP  (LMP Unknown)   SpO2 98%   BMI 21.41 kg/m   Wt Readings from Last 3 Encounters:  03/25/20 106 lb (48.1 kg)  02/13/20 108 lb (49 kg)  01/04/20 110 lb (49.9 kg)    Physical Exam Vitals and nursing note reviewed.  Constitutional:      General: She is not in acute distress.    Appearance: She is well-developed and well-nourished. She is not diaphoretic.     Comments: Frail elderly 87 year old female, mostly comfortable, cooperative  HENT:     Head: Normocephalic and atraumatic.     Right Ear: There is impacted cerumen.     Left Ear: There is impacted cerumen.     Mouth/Throat:     Mouth: Oropharynx is  clear and moist.  Eyes:     General:        Right eye: No discharge.        Left eye: No discharge.     Conjunctiva/sclera: Conjunctivae normal.  Neck:     Thyroid: No thyromegaly.  Cardiovascular:     Rate and Rhythm: Normal rate and regular rhythm.     Pulses: Intact distal pulses.     Heart sounds: Normal heart sounds. No murmur heard.   Pulmonary:     Effort: Pulmonary effort is normal. No respiratory distress.     Breath sounds: Normal breath sounds. No wheezing or rales.  Musculoskeletal:        General: No edema. Normal range of motion.     Cervical back: Normal range of motion and neck supple.  Lymphadenopathy:     Cervical: No cervical adenopathy.  Skin:    General: Skin is warm and dry.     Findings: No erythema or rash.  Neurological:     Mental Status: She is alert and oriented to person, place, and time.  Psychiatric:        Mood and Affect: Mood and affect normal.         Behavior: Behavior normal.     Comments: Well groomed, good eye contact, normal speech and thoughts    ________________________________________________________ PROCEDURE NOTE Date: 03/25/20 Bilateral Ear Lavage / Cerumen Removal Discussed benefits and risks (including pain / discomforts, dizziness, minor abrasion of ear canal). Verbal consent given by patient. Medication:  carbamide peroxide ear drops, Ear Lavage Solution (warm water + hydrogen peroxide) Performed by Loni Muse, CMA, Dr Parks Ranger Several drops of carbamide peroxide placed in each ear, allowed to sit for few minutes. Ear lavage solution flushed into one ear at a time in attempt to dislodge and remove ear wax. Results were successful  Repeat Ear Exam: - Completely removed cerumen now, with clear ear canals and visible TMs clear and normal appearance.    Recent Labs    07/10/19 0403 09/06/19 0808 03/25/20 1658  HGBA1C 7.2* 6.6* 8.3*     Results for orders placed or performed in visit on 03/25/20  POCT glycosylated hemoglobin (Hb A1C)  Result Value Ref Range   Hemoglobin A1C 8.3 (A) 4.0 - 5.6 %      Assessment & Plan:   Problem List Items Addressed This Visit    Type 2 diabetes mellitus with stage 4 chronic kidney disease ([place]) - Primary   Relevant Orders   POCT glycosylated hemoglobin (Hb A1C) (Completed)   Right wrist drop   Relevant Orders   Ambulatory referral to [facility]   Constipation    Other Visit Diagnoses    Closed fracture dislocation of right elbow with routine healing, subsequent encounter       Relevant Orders   Ambulatory referral to [place]   Bilateral impacted cerumen          #Constipation Chronic problem Previously followed by [facility] - has not returned to them She was on Senna x 2 and Miralax PRN mixed results Previously on pain medication opiates that impacted her constipation Counseling today on option to dose adjust miralax - she can increase to 2 capfuls then  up to 3 or 4, if need, to help with a disimpaction if constipation no BM for a few days. Counseling per AVS, increase miralax as tolerated. May hold Senna while increasing miralax.  #History of R Elbow Closed Fracture / s/p ORIF Repair /  R Wrist Drop Surgery on 02/13/20 per Dr Marcelino Scot No PT [facility] was set up due to unavailable to get staffing Will place referral now for Home Health PT / OT  #T2DM A1c 8.3 Off medication, history of hypoglycemia risk Counseling continue improving diet, it is okay if she maintains current level, goal A1c around 8 or less  #Biletaral Cerumen impaction S/p flushing today cleared bilateral, resolved issue. Use Debrox dropsOTC  Orders Placed This Encounter  Procedures  . Ambulatory referral to Home Health    Referral Priority:   Routine    Referral Type:   Home Health Care    Referral Reason:   Specialty Services Required    Requested Specialty:   [place]    Number of Visits Requested:   1  . POCT glycosylated hemoglobin (Hb A1C)     No orders of the defined types were placed in this encounter.     Follow up plan: Return in about 3 months (around 06/25/2020) for 3 month follow-up DM, HTN, Constipation.   Nobie Putnam, [facility] 03/25/2020, 5:01 PM

## 2020-03-25 NOTE — Patient Instructions (Addendum)
Thank you for coming to the office today.  Debrox Ear drops to treat ear wax You can use 2 drops each day for about 4 days then can use a bulb syringe to flush ears if need. \  Recent Labs    07/10/19 0403 09/06/19 0808 03/25/20 1658  HGBA1C 7.2* 6.6* 8.3*    For Constipation (less frequent bowel movement that can be hard dry or involve straining).  Recommend trying OTC Miralax 17g = 1 capful - START INCREASING up to 2 every day, then up to 3 then 4. Maximum dose is 8 capfuls in one day. - You can do max of 4 caps in 1 large glass of water, otherwise would recommend 2 glasses of water if doing more.  Goal is soft stool or BM 1-2 times daily, if too loose then reduce dose or try every other day. If not effective may need to increase it to 2 doses at once in AM or may do 1 in morning and 1 in afternoon/evening  - This medicine is very safe and can be used often without any problem and will not make you dehydrated. It is good for use on AS NEEDED BASIS or even MAINTENANCE therapy for longer term for several days to weeks at a time to help regulate bowel movements  Other more natural remedies or preventative treatment: - Increase hydration with water - Increase fiber in diet (high fiber foods = vegetables, leafy greens, oats/grains) - May take OTC Fiber supplement (metamucil powder or pill/gummy) - May try OTC Probiotic   Please schedule a Follow-up Appointment to: Return in about 3 months (around 06/25/2020) for 3 month follow-up DM, HTN, Constipation.  If you have any other questions or concerns, please feel free to call the office or send a message through [place]. You may also schedule an earlier appointment if necessary.  Additionally, you may be receiving a survey about your experience at our office within a few days to 1 week by e-mail or mail. We value your feedback.  Nobie Putnam, DO [place]

## 2020-03-26 ENCOUNTER — Telehealth: Payer: Self-pay

## 2020-03-26 DIAGNOSIS — R29898 Other symptoms and signs involving the musculoskeletal system: Secondary | ICD-10-CM

## 2020-03-26 DIAGNOSIS — S42401D Unspecified fracture of lower end of right humerus, subsequent encounter for fracture with routine healing: Secondary | ICD-10-CM

## 2020-03-26 NOTE — Telephone Encounter (Signed)
Request was sent for pt to have [facility] and Benjamine Mola has called on behalf of request and states due to staffing they can not honor this request.

## 2020-03-26 NOTE — Telephone Encounter (Signed)
Please notify our referral team and request if they can coordinate with any other [place].  We run into this issue a lot with Staffing issues due to [place] lately with Home Health.  I am okay with any company that can see her.  She has been denied home health now for the past 2+ months after surgery on her arm, and it is absolutely required that she has it.  Nobie Putnam, [facility] 03/26/2020, 4:26 PM

## 2020-03-27 NOTE — Telephone Encounter (Signed)
I spoke with the patient and she is willing to proceed with the outpatient physical therapy as long as they are able to accommodate her son schedule. Please place the referral. She request that you specify that she only can go to the one in [place] location.

## 2020-03-27 NOTE — Telephone Encounter (Signed)
At this point only option would be for her to go to PT instead of home health  Referral to outpatient / ambulatory Physical Therapy if she can get transportation and assistance to go  Could you check with patient / family to see if they can work with going to PT instead for now?  We could do [place] 03/27/2020, 10:38 AM

## 2020-03-27 NOTE — Telephone Encounter (Signed)
Referral in to [facility] PT - son can assist with transportation and help her go safely to outpatient PT  Nobie Putnam, [facility] 03/27/2020, 11:51 AM

## 2020-03-27 NOTE — Telephone Encounter (Signed)
I sent [facility] referral coordinator this information. She informed me that she reached out to [facility], [facility], [facility], and [facility]. They all denied the patient for staffing issues.

## 2020-04-01 ENCOUNTER — Telehealth: Payer: Self-pay

## 2020-04-01 NOTE — Telephone Encounter (Signed)
10 am.  Phone call made to patient to schedule a visit.  No answer and unable to leave a message.  PLAN: PC will outreach patient again on a later date.

## 2020-04-03 DIAGNOSIS — S42411D Displaced simple supracondylar fracture without intercondylar fracture of right humerus, subsequent encounter for fracture with routine healing: Secondary | ICD-10-CM | POA: Diagnosis not present

## 2020-04-09 ENCOUNTER — Ambulatory Visit: Payer: PPO | Admitting: Occupational Therapy

## 2020-04-10 ENCOUNTER — Ambulatory Visit: Payer: PPO | Attending: Family Medicine | Admitting: Occupational Therapy

## 2020-04-10 ENCOUNTER — Other Ambulatory Visit: Payer: Self-pay

## 2020-04-10 ENCOUNTER — Ambulatory Visit: Payer: PPO | Admitting: Licensed Clinical Social Worker

## 2020-04-10 ENCOUNTER — Encounter: Payer: Self-pay | Admitting: Occupational Therapy

## 2020-04-10 DIAGNOSIS — G5631 Lesion of radial nerve, right upper limb: Secondary | ICD-10-CM

## 2020-04-10 DIAGNOSIS — M6281 Muscle weakness (generalized): Secondary | ICD-10-CM | POA: Diagnosis not present

## 2020-04-10 DIAGNOSIS — N183 Chronic kidney disease, stage 3 unspecified: Secondary | ICD-10-CM

## 2020-04-10 DIAGNOSIS — I129 Hypertensive chronic kidney disease with stage 1 through stage 4 chronic kidney disease, or unspecified chronic kidney disease: Secondary | ICD-10-CM

## 2020-04-10 DIAGNOSIS — I10 Essential (primary) hypertension: Secondary | ICD-10-CM | POA: Diagnosis not present

## 2020-04-10 DIAGNOSIS — E1169 Type 2 diabetes mellitus with other specified complication: Secondary | ICD-10-CM

## 2020-04-10 DIAGNOSIS — N1831 Chronic kidney disease, stage 3a: Secondary | ICD-10-CM | POA: Diagnosis not present

## 2020-04-10 DIAGNOSIS — D631 Anemia in chronic kidney disease: Secondary | ICD-10-CM | POA: Diagnosis not present

## 2020-04-10 DIAGNOSIS — S42401A Unspecified fracture of lower end of right humerus, initial encounter for closed fracture: Secondary | ICD-10-CM

## 2020-04-10 DIAGNOSIS — N184 Chronic kidney disease, stage 4 (severe): Secondary | ICD-10-CM

## 2020-04-10 DIAGNOSIS — E1122 Type 2 diabetes mellitus with diabetic chronic kidney disease: Secondary | ICD-10-CM

## 2020-04-10 DIAGNOSIS — E785 Hyperlipidemia, unspecified: Secondary | ICD-10-CM

## 2020-04-10 DIAGNOSIS — R296 Repeated falls: Secondary | ICD-10-CM

## 2020-04-10 DIAGNOSIS — M25631 Stiffness of right wrist, not elsewhere classified: Secondary | ICD-10-CM

## 2020-04-10 DIAGNOSIS — M25641 Stiffness of right hand, not elsewhere classified: Secondary | ICD-10-CM | POA: Diagnosis not present

## 2020-04-10 DIAGNOSIS — M25621 Stiffness of right elbow, not elsewhere classified: Secondary | ICD-10-CM | POA: Diagnosis not present

## 2020-04-10 NOTE — Chronic Care Management (AMB) (Signed)
Chronic Care Management    Clinical Social Work Note  04/10/2020 Name: Lisa Mills MRN: [id number] DOB: 1929-03-12  Lisa Mills is a 87 y.o. year old female who is a primary care patient of Olin Hauser, DO. The CCM team was consulted to assist the patient with chronic disease management and/or care coordination needs related to: Level of Care Concerns.   Engaged with patient by telephone for follow up visit in response to provider referral for social work chronic care management and care coordination services.   Consent to Services:  The patient was given the following information about Chronic Care Management services today, agreed to services, and gave verbal consent: 1. CCM service includes personalized support from designated clinical staff supervised by the primary care provider, including individualized plan of care and coordination with other care providers 2. 24/7 contact phone numbers for assistance for urgent and routine care needs. 3. Service will only be billed when office clinical staff spend 20 minutes or more in a month to coordinate care. 4. Only one practitioner may furnish and bill the service in a calendar month. 5.The patient may stop CCM services at any time (effective at the end of the month) by phone call to the office staff. 6. The patient will be responsible for cost sharing (co-pay) of up to 20% of the service fee (after annual deductible is met). Patient agreed to services and consent obtained.  Patient agreed to services and consent obtained.   Assessment: Review of patient past medical history, allergies, medications, and health status, including review of relevant consultants reports was performed today as part of a comprehensive evaluation and provision of chronic care management and care coordination services.     SDOH (Social Determinants of Health) assessments and interventions performed:    Advanced Directives Status: See Care Plan for  related entries.  CCM Care Plan  Allergies  Allergen Reactions  . Aspirin Other (See Comments)    Burns stomach  . Conray [Iothalamate] Hives    IV dye conray-400  . Dye Fdc Red [Red Dye] Hives  . Sulfa Antibiotics     Unknown Reaction, not used in years  . Prednisone     Indigestion     Outpatient Encounter Medications as of 04/10/2020  Medication Sig  . acetaminophen (TYLENOL) 500 MG tablet Take 1 tablet (500 mg total) by mouth every 12 (twelve) hours.  . Blood Glucose Monitoring Suppl ([place]) w/Device KIT Use glucometer to check blood sugar daily. (Patient not taking: No sig reported)  . Cholecalciferol (VITAMIN D) 50 MCG (2000 UT) CAPS Take 2,000 Units by mouth daily.   Marland Kitchen escitalopram (LEXAPRO) 5 MG tablet TAKE 1 TABLET BY MOUTH ONCE DAILY WITH FOOD  . ferrous sulfate 325 (65 FE) MG tablet Take 325 mg by mouth daily.   Marland Kitchen glucose blood (ONE TOUCH ULTRA TEST) test strip CHECK BLOOD SUGAR UP TO 2 TIMES A DAY.  Marland Kitchen glycerin adult 2 g suppository Place 1 suppository rectally as needed for constipation.  Marland Kitchen Histamine Dihydrochloride (AUSTRALIAN DREAM ARTHRITIS) 0.025 % CREA Apply 1 application topically daily as needed (Pain).  . hydrocortisone (ANUSOL-HC) 2.5 % rectal cream USE 1 APPLICATION RECTALLY TWICE DAILY AS DIRECTED (Patient taking differently: Place 1 application rectally daily as needed for hemorrhoids or anal itching.)  . levothyroxine (SYNTHROID) 25 MCG tablet TAKE 1 TABLET BY MOUTH ONCE DAILY ON AN EMPTY STOMACH. WAIT 30 MINUTES BEFORE TAKING OTHER MEDS. (Patient taking differently: Take 25 mcg by  mouth daily before breakfast. AN EMPTY STOMACH. WAIT 30 MINUTES BEFORE TAKING OTHER MEDS.)  . lisinopril (ZESTRIL) 5 MG tablet Take 5 mg by mouth daily.  . nitrofurantoin (MACRODANTIN) 100 MG capsule Take 1 capsule (100 mg total) by mouth daily.  Marland Kitchen omeprazole (PRILOSEC) 20 MG capsule Take 20 mg by mouth daily before breakfast.  . OneTouch Delica Lancets 09F MISC 1  each by Does not apply route 3 (three) times daily. Dx:E11.9  . polyethylene glycol (MIRALAX / GLYCOLAX) 17 g packet Take 17 g by mouth daily as needed for moderate constipation or severe constipation. (Patient taking differently: Take 17 g by mouth daily.)  . simvastatin (ZOCOR) 40 MG tablet Take 1 tablet (40 mg total) by mouth at bedtime.  . traZODone (DESYREL) 50 MG tablet Take 0.5-1 tablets (25-50 mg total) by mouth at bedtime as needed for sleep. (Patient taking differently: Take 25 mg by mouth at bedtime as needed for sleep.)  . witch hazel-glycerin (TUCKS) pad Apply 1 application topically as needed for itching.   No facility-administered encounter medications on file as of 04/10/2020.    Patient Active Problem List   Diagnosis Date Noted  . Right wrist drop 03/25/2020  . Psychophysiological insomnia 10/06/2019  . Nonintractable headache 10/06/2019  . Urinary incontinence 10/06/2019  . Internal and external bleeding hemorrhoids 08/15/2019  . Sacral pain   . Radicular pain of sacrum 07/11/2019  . Sacral fracture, closed ([place]) 07/04/2019  . Fall 07/04/2019  . Bilateral pubic rami fractures, sequela 07/04/2019  . Background diabetic retinopathy ([place]) 01/23/2019  . Schatzki's ring 03/04/2017  . Post herpetic neuralgia 10/06/2016  . Hx of colonic polyps 09/22/2016  . Osteopenia 09/02/2016  . Anxiety associated with depression 08/25/2016  . Trapezius muscle spasm 05/25/2016  . Intermittent left lower quadrant abdominal pain 03/16/2016  . Anemia in chronic kidney disease (CKD) 02/26/2016  . Type 2 diabetes mellitus with stage 4 chronic kidney disease ([place]) 02/25/2016  . Hypothyroidism 02/25/2016  . Constipation 02/25/2016  . GERD (gastroesophageal reflux disease) 02/25/2016  . Hyperlipidemia associated with type 2 diabetes mellitus ([place]) 02/25/2016  . DJD (degenerative joint disease) of cervical spine 02/25/2016  . Osteoarthritis of multiple joints 02/25/2016  . Hiatal hernia  02/25/2016  . CKD stage 3 due to type 2 diabetes mellitus ([place]) 02/25/2016  . Chronic kidney disease, unspecified 09/05/2013  . Benign hypertension with CKD (chronic kidney disease) stage III ([place]) 09/05/2013  . CMC arthritis, thumb, degenerative 06/15/2013  . Onychomycosis due to dermatophyte 05/25/2013  . Acquired keratoderma 05/25/2013    Care Plan : General Social Work (Adult)  Updates made by Greg Cutter, LCSW since 04/10/2020 12:00 AM    Problem: Coping Skills (General Plan of Care)     Long-Range Goal: Coping Skills Enhanced   Start Date: 02/01/2020  Priority: Medium  Note:   Timeframe:  Long-Range Goal Priority:  Medium Start Date:  04/10/20                  Expected End Date: 07/11/20                  Follow Up Date - 05/22/20   - begin personal counseling - call and visit an old friend - check out volunteer opportunities - join a support group - laugh; watch a funny movie or comedian - learn and use visualization or guided imagery - perform a random act of kindness - practice relaxation or meditation daily - start or continue a personal  journal - talk about feelings with a friend, family or spiritual advisor - practice positive thinking and self-talk    Why is this important?    When you are stressed, down or upset, your body reacts too.   For example, your blood pressure may get higher; you may have a headache or stomachache.   When your emotions get the best of you, your body's ability to fight off cold and flu gets weak.   These steps will help you manage your emotions.    - begin a notebook of services in my neighborhood or community - call 211 when I need some help - follow-up on any referrals for help I am given - think ahead to make sure my need does not become an emergency - make a note about what I need to have by the phone or take with me, like an identification card or social security number have a back-up plan - have a back-up plan - make a  list of family or friends that I can call -consider hiring in home support since having to discontinue services with last aide    Why is this important?   Knowing how and where to find help for yourself or family in your neighborhood and community is an important skill.  You will want to take some steps to learn how.    Notes: Son comes daily for several hours to check on patient.   - check out options for in-home help, long-term care or hospice - complete a living will - discuss my treatment options with the doctor or nurse - do one enjoyable thing every day - do something different, like talking to a new person or going to a new place, every day - learn something new by asking, reading and searching the Internet every day - make an audio or video recording for my loved ones - make shared treatment decisions with doctor - meditate daily - name a health care proxy (decision maker) - share memories using a picture album or scrapbook with my loved ones - spend time with a child every day, borrow one if I have to - spend time outdoors at least 3 times a week - strengthen or fix relationships with loved ones  -maintain healthy eating and drinking. Patient continues to lose weight.  -take cane and phone with her everywhere at all times -continue healthy socialization    Why is this important?   Having a long-term illness can be scary.  It can also be stressful for you and your caregiver.  These steps may help.    Current barriers:  Patient unable to consistently perform activities of daily living and needs additional assistance and support in order to meet this unmet need . Limited social support, ADL IADL limitations, Mental Health Concerns , Social Isolation, and Limited access to caregiver . Lacks social connections  Clinical Goals: Over the next 120 days, patient will gain additional education on in home support resources within her area  Over the next 120 days, patient will work  with SW to address concerns related to anxiety management  Interventions : . Assessed needs, level of care concerns, basic eligibility and provided education on available PCS resources. Patient is still in need of in home assistance since losing her last aide. Patient declines wanting any type of LTC placement.  . Patient's spouse passed last April and patient is struggling with managing her care within the home.  . Son [name] continues to come by  daily to assist patient with her daily needs. However, patient's other son Shanon Brow has been battling cirrhosis of the liver and is back at [facility] after being discharged from hospital to SNF. This has increased patient's stress. Shanon Brow lives in [place] and has been in poor health for several years and has had to have a blood transfusion.  . Patient was advised to return Palliative Care's missed call from 04/01/20. Contact number was provided to patient. She had a difficult time writing this number down and had to use her left hand because of her broken right elbow.  . Patient will start PT for her elbow today on 04/10/20.  . Patient reports that her main concern is transportation because her son is working [employer]ist, [employer] and doesn't get off work until 3:30 pm. CCM LCSW provided education on available transportation resources within her area. Patient has a new aide who comes twice per week and is able to provide stable transportation when needed. Aide has been providing transportation for patient to get her hair done. . Reviewed community support options ( CAP, private pay, PACE program) . 1:1 collaboration with PCP regarding development and update of comprehensive plan of care as evidenced by provider attestation and co-signature  . Provided emotional support regarding patient's recent fall which led to a broken elbow. Patient has an upcoming surgery next week to repair this.  . Patient interviewed and appropriate assessments performed . Discussed plans with patient  for ongoing care management follow up and provided patient with direct contact information for care management team . Assisted patient/caregiver with obtaining information about health plan benefits . Provided education and assistance to client regarding Advanced Directives. . Provided education to patient/caregiver regarding level of care options. . Provided education to patient/caregiver about Hospice and/or Palliative Care services . Other interventions provided: Motivational Interviewing, Solution-Focused Strategies, and Brief CBT . Collaboration with PCP regarding development and update of comprehensive plan of care as evidenced by provider attestation and co-signature . Inter-disciplinary care team collaboration (see longitudinal plan of care) . Collaborated with appropriate clinical care team members regarding patient needs       Follow Up Plan: SW will follow up with patient by phone over the next quarter      Eula Fried, [place], MSW, [email or web address] Phone: [phone]

## 2020-04-10 NOTE — Patient Instructions (Signed)
Licensed Clinical Social Worker Visit Information  Goals we discussed today:  Goals Addressed            This Visit's Progress   . SW-Matintain My Quality of Life       Timeframe:  Long-Range Goal Priority:  Medium Start Date:  04/10/20                  Expected End Date: 07/11/20                  Follow Up Date - 05/22/20   - begin personal counseling - call and visit an old friend - check out volunteer opportunities - join a support group - laugh; watch a funny movie or comedian - learn and use visualization or guided imagery - perform a random act of kindness - practice relaxation or meditation daily - start or continue a personal journal - talk about feelings with a friend, family or spiritual advisor - practice positive thinking and self-talk    Why is this important?    When you are stressed, down or upset, your body reacts too.   For example, your blood pressure may get higher; you may have a headache or stomachache.   When your emotions get the best of you, your body's ability to fight off cold and flu gets weak.   These steps will help you manage your emotions.    - begin a notebook of services in my neighborhood or community - call 211 when I need some help - follow-up on any referrals for help I am given - think ahead to make sure my need does not become an emergency - make a note about what I need to have by the phone or take with me, like an identification card or social security number have a back-up plan - have a back-up plan - make a list of family or friends that I can call -consider hiring in home support since having to discontinue services with last aide    Why is this important?   Knowing how and where to find help for yourself or family in your neighborhood and community is an important skill.  You will want to take some steps to learn how.    Notes: Son comes daily for several hours to check on patient.   - check out options for in-home help,  long-term care or hospice - complete a living will - discuss my treatment options with the doctor or nurse - do one enjoyable thing every day - do something different, like talking to a new person or going to a new place, every day - learn something new by asking, reading and searching the Internet every day - make an audio or video recording for my loved ones - make shared treatment decisions with doctor - meditate daily - name a health care proxy (decision maker) - share memories using a picture album or scrapbook with my loved ones - spend time with a child every day, borrow one if I have to - spend time outdoors at least 3 times a week - strengthen or fix relationships with loved ones  -maintain healthy eating and drinking. Patient continues to lose weight.  -take cane and phone with her everywhere at all times -continue healthy socialization    Why is this important?   Having a long-term illness can be scary.  It can also be stressful for you and your caregiver.  These steps may help.  Current barriers:  Patient unable to consistently perform activities of daily living and needs additional assistance and support in order to meet this unmet need . Limited social support, ADL IADL limitations, Mental Health Concerns , Social Isolation, and Limited access to caregiver . Lacks social connections  Clinical Goals: Over the next 120 days, patient will gain additional education on in home support resources within her area  Over the next 120 days, patient will work with SW to address concerns related to anxiety management  Interventions : . Assessed needs, level of care concerns, basic eligibility and provided education on available PCS resources. Patient is still in need of in home assistance since losing her last aide. Patient declines wanting any type of LTC placement.  . Patient's spouse passed last April and patient is struggling with managing her care within the home.  . Son [name]  continues to come by daily to assist patient with her daily needs. However, patient's other son Shanon Brow has been battling cirrhosis of the liver and is back at [facility] after being discharged from hospital to SNF. This has increased patient's stress. Shanon Brow lives in [place] and has been in poor health for several years and has had to have a blood transfusion.  . Patient was advised to return Palliative Care's missed call from 04/01/20. Contact number was provided to patient. She had a difficult time writing this number down and had to use her left hand because of her broken right elbow.  . Patient will start PT for her elbow today on 04/10/20.  . Patient reports that her main concern is transportation because her son is working Geneticist, molecular and doesn't get off work until 3:30 pm. CCM LCSW provided education on available transportation resources within her area. Patient has a new aide who comes twice per week and is able to provide stable transportation when needed. Aide has been providing transportation for patient to get her hair done. . Reviewed community support options ( CAP, private pay, PACE program) . 1:1 collaboration with PCP regarding development and update of comprehensive plan of care as evidenced by provider attestation and co-signature  . Provided emotional support regarding patient's recent fall which led to a broken elbow. Patient has an upcoming surgery next week to repair this.  . Patient interviewed and appropriate assessments performed . Discussed plans with patient for ongoing care management follow up and provided patient with direct contact information for care management team . Assisted patient/caregiver with obtaining information about health plan benefits . Provided education and assistance to client regarding Advanced Directives. . Provided education to patient/caregiver regarding level of care options. . Provided education to patient/caregiver about Hospice and/or Palliative Care  services . Other interventions provided: Motivational Interviewing, Solution-Focused Strategies, and Brief CBT . Collaboration with PCP regarding development and update of comprehensive plan of care as evidenced by provider attestation and co-signature . Inter-disciplinary care team collaboration (see longitudinal plan of care) . Collaborated with appropriate clinical care team members regarding patient needs       Eula Fried, BSW, MSW, [email or web address] Phone: [phone]

## 2020-04-10 NOTE — Therapy (Signed)
[facility] [address] [place], [address] Phone: [phone]   Fax:  [phone]  Occupational Therapy Evaluation  Patient Details  Name: Lisa Mills MRN: [id number] Date of Birth: 02/07/1929 Referring Provider (OT): Dr Marcelino Scot   Encounter Date: 04/10/2020   OT End of Session - 04/10/20 1608    Visit Number 1    Number of Visits 16    Date for OT Re-Evaluation 06/05/20    OT Start Time 6812    OT Stop Time 1540    OT Time Calculation (min) 55 min    Activity Tolerance Patient tolerated treatment well    Behavior During Therapy [facility] for tasks assessed/performed           Past Medical History:  Diagnosis Date  . Anemia   . Anxiety   . Chronic kidney disease    stage 3-4  . Constipation   . Depression   . Dermatophytosis of nail   . Diabetes mellitus without complication ([place])    Diet controlled, lost weight, no meds  . GERD (gastroesophageal reflux disease)   . Hemorrhoids   . History of shingles   . HLD (hyperlipidemia)   . Hypertension   . Hypothyroidism   . Keratoderma   . Restless legs syndrome (RLS)    hx    Past Surgical History:  Procedure Laterality Date  . BREAST BIOPSY Right 09/30/2017   Affirm bx-calcs ( X clip),benign  . BREAST EXCISIONAL BIOPSY Right    neg  . cataracts    . COLONOSCOPY W/ POLYPECTOMY    . COLONOSCOPY WITH PROPOFOL N/A 02/10/2017   Procedure: COLONOSCOPY WITH PROPOFOL;  Surgeon: Manya Silvas, MD;  Location: [facility] ENDOSCOPY;  Service: Endoscopy;  Laterality: N/A;  . ESOPHAGOGASTRODUODENOSCOPY (EGD) WITH PROPOFOL N/A 02/10/2017   Procedure: ESOPHAGOGASTRODUODENOSCOPY (EGD) WITH PROPOFOL;  Surgeon: Manya Silvas, MD;  Location: [facility] ENDOSCOPY;  Service: Endoscopy;  Laterality: N/A;  . EYE SURGERY     bilateral cataracts  . HAND SURGERY    . KIDNEY STONE SURGERY    . ORIF HUMERUS FRACTURE Right 02/13/2020   Procedure: OPEN REDUCTION INTERNAL  FIXATION (ORIF) SUPRACONDYLAR  HUMERUS FRACTURE;  Surgeon: Altamese Henning, MD;  Location: [facility];  Service: Orthopedics;  Laterality: Right;  . SACROPLASTY N/A 07/06/2019   Procedure: SACROPLASTY;  Surgeon: Hessie Knows, MD;  Location: [facility] ORS;  Service: Orthopedics;  Laterality: N/A;  . TONSILECTOMY, ADENOIDECTOMY, BILATERAL MYRINGOTOMY AND TUBES    . TONSILLECTOMY    . TRIGGER FINGER RELEASE     Left ring finger  . tubercular peritonitis      There were no vitals filed for this visit.   Subjective Assessment - 04/10/20 1554    Subjective  I tried to do exercises for my wrist and hand - looked on the computer - and try and eat the last 3 bites with my R hand, pull the door -but just not strong - my wrist just stay hanging down    Pertinent History Pt fell and ended up with R supracondylar fx - ORIF done by Dr Marcelino Scot -pt also with radial N palsy - was suppose to have [facility] PT but never got it -and refer now by Dr Marcelino Scot to outpt OT on 04/03/20 - for ROM and possible radial N palsy custom splint    Patient Stated Goals Want to use my R hand to eat, do my hair, makeup, open doors, cook and make bed as  well as drive again    Currently in Pain? Yes    Pain Score 3     Pain Location --   dorsal forearm tender   Pain Orientation Right    Pain Descriptors / Indicators Tender    Pain Type Surgical pain;Acute pain             [facility] OT Assessment - 04/10/20 0001      Assessment   Medical Diagnosis R supracondylar fx with ORIF, radial N palsy    Referring Provider (OT) Dr Marcelino Scot    Onset Date/Surgical Date 02/13/20    Hand Dominance Right    Next MD Visit --   month from 04/03/20     Balance Screen   Has the patient fallen in the past 6 months --   2     Home  Environment   Lives With Alone      Prior Function   Vocation Retired    Leisure house work, Training and development officer, word puzzle , flowers,      AROM   Overall AROM Comments R hand make fist, ext PIP's , MC keep in flexion    Right Elbow Flexion 105     Right Elbow Extension -40    Right Forearm Pronation 90 Degrees    Right Forearm Supination 90 Degrees    Right Wrist Extension --   without gravity 1/3 AROM   Right Wrist Flexion --   Keep in 60 degrees flexion   Right Wrist Radial Deviation --   0 without gravity on table slide   Right Wrist Ulnar Deviation --   2/5 sliding on table , on paper without gravity     Right Hand AROM   R Thumb Radial ABduction/ADduction 0-55 --   no AROM   R Thumb Palmar ABduction/ADduction 0-45 --   30   R Thumb Opposition to Index --   unable                   OT Treatments/Exercises (OP) - 04/10/20 0001      Moist Heat Therapy   Number Minutes Moist Heat 8 Minutes    Moist Heat Location Hand;Wrist;Elbow   prior to ROM           Pt to do heat on R elbow and hand - 3 x day  Flatten hand and digits on table 5 min  Take L hand and tap each digit 5 reps  Massage webspace -and then AROM for thumb PA 10 reps   PROM and hold wrist extention stretch with forearm on table  10reps hold 5 sec  Then sideways on table - on paper - facilitate wrist extention - partial - to midline- do AAROM using L hand and then attempt 5 reps - in between PROM stretch for wrist and digits extention - 10reps   On paper on table AAROM and AROM for UD and RD of wrist - 10 reps AROM forearm sup/pro - elbow to side 10 reps  5 x day - elbow flexion PROM to R shoulder -hold 10 sec - 10-20 reps And extention in between every time        OT Education - 04/10/20 1608    Education Details findings of eval and HEP    Person(s) Educated Patient;Child(ren)    Methods Explanation;Demonstration;Tactile cues;Verbal cues;Handout    Comprehension Verbal cues required;Returned demonstration;Verbalized understanding            OT Short Term Goals - 04/10/20 1801  OT SHORT TERM GOAL #1   Title Pt to be independent in HEP to decrease tightness in flexors of R forearm and hand to initiate AROM for wrist and  digits extention    Baseline Pt did not had any therapy since surgery - HH could not come - very tight and stiff -    Time 3    Period Weeks    Status New    Target Date 05/01/20             OT Long Term Goals - 04/10/20 1807      OT LONG TERM GOAL #1   Title R elbow flexion and extention increase with more than 20 degrees to apply deodorant, wash hair and bath chest and neck    Baseline Flexion 105 R elbow, extention -40 -    Time 4    Period Weeks    Status New    Target Date 05/08/20      OT LONG TERM GOAL #2   Title R wrist extention improve for pt to maintain wrist neutral for to bath herself and wipe counters    Baseline R wrist and digits flexors tight -and keep wrist in 60 degrees flexion - unable to open or extend MC's    Time 4    Period Weeks    Status New    Target Date 05/08/20      OT LONG TERM GOAL #3   Title R thumb AROM increase for pt to abd to grasp objects or utenciles of 1-2 inches    Baseline No AROM RA - PA 1/3 range without gravitiy - cannot grip any objects or release    Time 5    Period Weeks    Status New    Target Date 05/15/20      OT LONG TERM GOAL #4   Title R wrist AROM increase for pt to maintain wrist with grip to carry 1 lbs objects in ADL's    Baseline wrist drop - keep in 60 degrees flexion - cannot make fist - only when keep wrist with other hand at neutrral    Time 6    Period Weeks    Status New    Target Date 05/22/20      OT LONG TERM GOAL #5   Title Pt R elbow strength increase for pt to pick up or carry more than 4 lbs - wiht wrist splint if needed    Baseline no strength -just started therapy - radial nerve injury - wrist drop -and elbow AROM -40 and flexion 105    Time 8    Period Weeks    Status New    Target Date 06/05/20                 Plan - 04/10/20 1611    Clinical Impression Statement Pt present at OT eval 8 wks and one day s/p R supracondylar fx with ORIF and radial N palsy - pt was unable to get  HHPT and now refer to out pt OT - pt show scar tissue adhesion on posterior elbow, decrease R dominant hand elbow flexion and extention. Stiffness in hand and wrist/forearm because of radial N palsy and not had therapy - able to do sup/pro - but 1/5 wrist extention , UD, thumb PA - wrist drop,decrease MC extention and thumb RA - limiting her functional use of R dominant hand in ADL's and IADL's -pt live alone - and was prior to fall  independent in ADL's , IADL's including driving    OT Occupational Profile and History Problem Focused Assessment - Including review of records relating to presenting problem    Occupational performance deficits (Please refer to evaluation for details): ADL's;IADL's;Play;Rest and Sleep;Leisure;Social Participation    Body Structure / Function / Physical Skills ADL;Flexibility;ROM;UE functional use;FMC;Decreased knowledge of use of DME;Dexterity;Sensation;Strength;Pain;IADL    Rehab Potential Good    Clinical Decision Making Limited treatment options, no task modification necessary    Comorbidities Affecting Occupational Performance: None    Modification or Assistance to Complete Evaluation  No modification of tasks or assist necessary to complete eval    OT Frequency 2x / week    OT Duration 8 weeks    OT Treatment/Interventions Self-care/ADL training;Moist Heat;Paraffin;DME and/or AE instruction;Manual Therapy;Passive range of motion;Scar mobilization;Splinting;Patient/family education;Therapeutic exercise    Consulted and Agree with Plan of Care Patient           Patient will benefit from skilled therapeutic intervention in order to improve the following deficits and impairments:   Body Structure / Function / Physical Skills: ADL,Flexibility,ROM,UE functional use,FMC,Decreased knowledge of use of DME,Dexterity,Sensation,Strength,Pain,IADL       Visit Diagnosis: Compression of right radial nerve - Plan: Ot plan of care cert/re-cert  Stiffness of right elbow,  not elsewhere classified - Plan: Ot plan of care cert/re-cert  Stiffness of right wrist, not elsewhere classified - Plan: Ot plan of care cert/re-cert  Stiffness of right hand, not elsewhere classified - Plan: Ot plan of care cert/re-cert  Muscle weakness (generalized) - Plan: Ot plan of care cert/re-cert    Problem List Patient Active Problem List   Diagnosis Date Noted  . Right wrist drop 03/25/2020  . Psychophysiological insomnia 10/06/2019  . Nonintractable headache 10/06/2019  . Urinary incontinence 10/06/2019  . Internal and external bleeding hemorrhoids 08/15/2019  . Sacral pain   . Radicular pain of sacrum 07/11/2019  . Sacral fracture, closed ([place]) 07/04/2019  . Fall 07/04/2019  . Bilateral pubic rami fractures, sequela 07/04/2019  . Background diabetic retinopathy ([place]) 01/23/2019  . Schatzki's ring 03/04/2017  . Post herpetic neuralgia 10/06/2016  . Hx of colonic polyps 09/22/2016  . Osteopenia 09/02/2016  . Anxiety associated with depression 08/25/2016  . Trapezius muscle spasm 05/25/2016  . Intermittent left lower quadrant abdominal pain 03/16/2016  . Anemia in chronic kidney disease (CKD) 02/26/2016  . Type 2 diabetes mellitus with stage 4 chronic kidney disease ([place]) 02/25/2016  . Hypothyroidism 02/25/2016  . Constipation 02/25/2016  . GERD (gastroesophageal reflux disease) 02/25/2016  . Hyperlipidemia associated with type 2 diabetes mellitus ([place]) 02/25/2016  . DJD (degenerative joint disease) of cervical spine 02/25/2016  . Osteoarthritis of multiple joints 02/25/2016  . Hiatal hernia 02/25/2016  . CKD stage 3 due to type 2 diabetes mellitus ([place]) 02/25/2016  . Chronic kidney disease, unspecified 09/05/2013  . Benign hypertension with CKD (chronic kidney disease) stage III ([place]) 09/05/2013  . CMC arthritis, thumb, degenerative 06/15/2013  . Onychomycosis due to dermatophyte 05/25/2013  . Acquired keratoderma 05/25/2013    Rosalyn Gess  OTR/L,CLT 04/10/2020, 6:16 PM  [facility] [address], [place], [address] Phone: [phone]   Fax:  [phone]  Name: RAGINA FENTER MRN: [id number] Date of Birth: 12/20/1929

## 2020-04-10 NOTE — Patient Instructions (Signed)
Pt to do heat on R elbow and hand - 3 x day  Flatten hand and digits on table 5 min  Take L hand and tap each digit 5 reps  Massage webspace -and then AROM for thumb PA 10 reps   PROM and hold wrist extention stretch with forearm on table  10reps hold 5 sec  Then sideways on table - on paper - facilitate wrist extention - partial - to midline- do AAROM using L hand and then attempt 5 reps - in between PROM stretch for wrist and digits extention - 10reps   On paper on table AAROM and AROM for UD and RD of wrist - 10 reps AROM forearm sup/pro - elbow to side 10 reps  5 x day - elbow flexion PROM to R shoulder -hold 10 sec - 10-20 reps And extention in between every time

## 2020-04-11 ENCOUNTER — Telehealth: Payer: Self-pay

## 2020-04-11 NOTE — Telephone Encounter (Signed)
The pt called with concerns that the outpatient PT will not be covered by her insurance. I informed the patient after speaking with [facility] that her insurance was verified and all she is expected to pay is the $30.00 Co-pay. The patient verbalize understanding.

## 2020-04-16 ENCOUNTER — Ambulatory Visit: Payer: PPO | Admitting: Occupational Therapy

## 2020-04-16 ENCOUNTER — Other Ambulatory Visit: Payer: Self-pay

## 2020-04-16 DIAGNOSIS — M6281 Muscle weakness (generalized): Secondary | ICD-10-CM

## 2020-04-16 DIAGNOSIS — G5631 Lesion of radial nerve, right upper limb: Secondary | ICD-10-CM | POA: Diagnosis not present

## 2020-04-16 DIAGNOSIS — M25621 Stiffness of right elbow, not elsewhere classified: Secondary | ICD-10-CM

## 2020-04-16 DIAGNOSIS — M25631 Stiffness of right wrist, not elsewhere classified: Secondary | ICD-10-CM

## 2020-04-16 DIAGNOSIS — M25641 Stiffness of right hand, not elsewhere classified: Secondary | ICD-10-CM

## 2020-04-16 NOTE — Therapy (Signed)
[facility] [address] [place], [address] Phone: [phone]   Fax:  [phone]  Occupational Therapy Treatment  Patient Details  Name: Lisa Mills MRN: [id number] Date of Birth: [birth year] Referring Provider (OT): Dr Marcelino Scot   Encounter Date: 04/16/2020   OT End of Session - 04/16/20 1601    Visit Number 2    Number of Visits 16    Date for OT Re-Evaluation 06/05/20    OT Start Time 5885    OT Stop Time 1640    OT Time Calculation (min) 70 min    Activity Tolerance Patient tolerated treatment well    Behavior During Therapy [facility] for tasks assessed/performed           Past Medical History:  Diagnosis Date  . Anemia   . Anxiety   . Chronic kidney disease    stage 3-4  . Constipation   . Depression   . Dermatophytosis of nail   . Diabetes mellitus without complication ([place])    Diet controlled, lost weight, no meds  . GERD (gastroesophageal reflux disease)   . Hemorrhoids   . History of shingles   . HLD (hyperlipidemia)   . Hypertension   . Hypothyroidism   . Keratoderma   . Restless legs syndrome (RLS)    hx    Past Surgical History:  Procedure Laterality Date  . BREAST BIOPSY Right 09/30/2017   Affirm bx-calcs ( X clip),benign  . BREAST EXCISIONAL BIOPSY Right    neg  . cataracts    . COLONOSCOPY W/ POLYPECTOMY    . COLONOSCOPY WITH PROPOFOL N/A 02/10/2017   Procedure: COLONOSCOPY WITH PROPOFOL;  Surgeon: Manya Silvas, MD;  Location: [facility] ENDOSCOPY;  Service: Endoscopy;  Laterality: N/A;  . ESOPHAGOGASTRODUODENOSCOPY (EGD) WITH PROPOFOL N/A 02/10/2017   Procedure: ESOPHAGOGASTRODUODENOSCOPY (EGD) WITH PROPOFOL;  Surgeon: Manya Silvas, MD;  Location: [facility] ENDOSCOPY;  Service: Endoscopy;  Laterality: N/A;  . EYE SURGERY     bilateral cataracts  . HAND SURGERY    . KIDNEY STONE SURGERY    . ORIF HUMERUS FRACTURE Right 02/13/2020   Procedure: OPEN REDUCTION INTERNAL  FIXATION (ORIF) SUPRACONDYLAR  HUMERUS FRACTURE;  Surgeon: Altamese Kann[place], MD;  Location: [place];  Service: Orthopedics;  Laterality: Right;  . SACROPLASTY N/A 07/06/2019   Procedure: SACROPLASTY;  Surgeon: Hessie Knows, MD;  Location: [facility] ORS;  Service: Orthopedics;  Laterality: N/A;  . TONSILECTOMY, ADENOIDECTOMY, BILATERAL MYRINGOTOMY AND TUBES    . TONSILLECTOMY    . TRIGGER FINGER RELEASE     Left ring finger  . tubercular peritonitis      There were no vitals filed for this visit.   Subjective Assessment - 04/16/20 1558    Subjective  Exercises went well- try to eat some yogurt - was able to get to mouth and start the car- but did not had my brace on -and trying to get my hand flat    Pertinent History Pt fell and ended up with R supracondylar fx - ORIF done by Dr Marcelino Scot -pt also with radial N palsy - was suppose to have [facility] PT but never got it -and refer now by Dr Marcelino Scot to outpt OT on 04/03/20 - for ROM and possible radial N palsy custom splint    Patient Stated Goals Want to use my R hand to eat, do my hair, makeup, open doors, cook and make bed as well as drive again    Currently in  Pain? Yes    Pain Score 3     Pain Location Arm    Pain Orientation Right    Pain Descriptors / Indicators Aching    Pain Type Chronic pain    Pain Onset More than a month ago    Pain Frequency Intermittent    Aggravating Factors  RTC - but long history            Elbow extention this date -25, flexion 112 degrees Sup/pro 4/5 Wrist extention to neutral without gravity UD AROM 2/5/without gravity - but 1/5 RD Pt with decrease tightness in flexors of hand and wrist - can lay hand flat on table this date- but unable to tap digits  And extend MC's - PIP full extention            OT Treatments/Exercises (OP) - 04/16/20 0001      RUE Paraffin   Number Minutes Paraffin 8 Minutes    RUE Paraffin Location Hand    Comments prior to soft itssue and ROM            DOne heatingpad for  elbow extention - stretch during paraffin to hand and wrist Soft tissue mobs by OT on volar wrist and forearm - mini massager on hand and wrist prior to ROM  Massage webspace -and then AROM for thumb PA 10 reps    PROM and hold wrist extention stretch with forearm on table  10reps hold 5 sec  Add table slides for wrist extention on table - on towel - 20 reps   Flatten hand and digits on table 5 min  Take L hand and tap each digit 5 reps MC and PIP extention over edge of table 10 reps - AAROM to finish ROM     Then sideways on table - on paper - facilitate wrist extention - partial - to midline- do AAROM using L hand and then attempt 5 reps - in between PROM stretch for wrist and digits extention - 10reps   Palm together for RD, UD - AAROM 10 reps  AROM forearm sup/pro - elbow to side 10 reps  3 x day - elbow flexion PROM to R shoulder -hold 10 sec - 10-20 reps- REINFORCE again - pt compensate with shoulder  And extention in between every time    Fitted with new wrist prefab splint to use during functional tasks at home- to provide stable wrist- pt able to cut food , feed self , pull and push door , turn doorknob and fold towel - with wrist splint on R hand        OT Education - 04/16/20 1601    Education Details progress and HEP    Person(s) Educated Patient;Child(ren)    Methods Explanation;Demonstration;Tactile cues;Verbal cues;Handout    Comprehension Verbal cues required;Returned demonstration;Verbalized understanding            OT Short Term Goals - 04/10/20 1801      OT SHORT TERM GOAL #1   Title Pt to be independent in HEP to decrease tightness in flexors of R forearm and hand to initiate AROM for wrist and digits extention    Baseline Pt did not had any therapy since surgery - HH could not come - very tight and stiff -    Time 3    Period Weeks    Status New    Target Date 05/01/20             OT Long Term Goals -  04/10/20 1807      OT LONG TERM  GOAL #1   Title R elbow flexion and extention increase with more than 20 degrees to apply deodorant, wash hair and bath chest and neck    Baseline Flexion 105 R elbow, extention -40 -    Time 4    Period Weeks    Status New    Target Date 05/08/20      OT LONG TERM GOAL #2   Title R wrist extention improve for pt to maintain wrist neutral for to bath herself and wipe counters    Baseline R wrist and digits flexors tight -and keep wrist in 60 degrees flexion - unable to open or extend MC's    Time 4    Period Weeks    Status New    Target Date 05/08/20      OT LONG TERM GOAL #3   Title R thumb AROM increase for pt to abd to grasp objects or utenciles of 1-2 inches    Baseline No AROM RA - PA 1/3 range without gravitiy - cannot grip any objects or release    Time 5    Period Weeks    Status New    Target Date 05/15/20      OT LONG TERM GOAL #4   Title R wrist AROM increase for pt to maintain wrist with grip to carry 1 lbs objects in ADL's    Baseline wrist drop - keep in 60 degrees flexion - cannot make fist - only when keep wrist with other hand at neutrral    Time 6    Period Weeks    Status New    Target Date 05/22/20      OT LONG TERM GOAL #5   Title Pt R elbow strength increase for pt to pick up or carry more than 4 lbs - wiht wrist splint if needed    Baseline no strength -just started therapy - radial nerve injury - wrist drop -and elbow AROM -40 and flexion 105    Time 8    Period Weeks    Status New    Target Date 06/05/20                 Plan - 04/16/20 1601    Clinical Impression Statement Pt is 9 wks s/p R supracondylar fx with ORIF and radial N palsy - pt was not able to get HH and started outpt OT last week- pt showed great progress in elbow extention and decreas tightness in flexors of forearm and hand - able to facilitace more UD and RD of wrist , wrist extention to neutral without gravity- thumb PA - ed pt on nerve healing again and change HEP -add  wrist splint to wear during functional tasks    OT Occupational Profile and History Problem Focused Assessment - Including review of records relating to presenting problem    Occupational performance deficits (Please refer to evaluation for details): ADL's;IADL's;Play;Rest and Sleep;Leisure;Social Participation    Body Structure / Function / Physical Skills ADL;Flexibility;ROM;UE functional use;FMC;Decreased knowledge of use of DME;Dexterity;Sensation;Strength;Pain;IADL    Rehab Potential Good    Comorbidities Affecting Occupational Performance: None    Modification or Assistance to Complete Evaluation  No modification of tasks or assist necessary to complete eval    OT Frequency 2x / week    OT Duration 8 weeks    OT Treatment/Interventions Self-care/ADL training;Moist Heat;Paraffin;DME and/or AE instruction;Manual Therapy;Passive range of motion;Scar mobilization;Splinting;Patient/family education;Therapeutic exercise  Consulted and Agree with Plan of Care Patient           Patient will benefit from skilled therapeutic intervention in order to improve the following deficits and impairments:   Body Structure / Function / Physical Skills: ADL,Flexibility,ROM,UE functional use,FMC,Decreased knowledge of use of DME,Dexterity,Sensation,Strength,Pain,IADL       Visit Diagnosis: Compression of right radial nerve  Stiffness of right elbow, not elsewhere classified  Stiffness of right wrist, not elsewhere classified  Stiffness of right hand, not elsewhere classified  Muscle weakness (generalized)    Problem List Patient Active Problem List   Diagnosis Date Noted  . Right wrist drop 03/25/2020  . Psychophysiological insomnia 10/06/2019  . Nonintractable headache 10/06/2019  . Urinary incontinence 10/06/2019  . Internal and external bleeding hemorrhoids 08/15/2019  . Sacral pain   . Radicular pain of sacrum 07/11/2019  . Sacral fracture, closed ([place]) 07/04/2019  . Fall  07/04/2019  . Bilateral pubic rami fractures, sequela 07/04/2019  . Background diabetic retinopathy ([place]) 01/23/2019  . Schatzki's ring 03/04/2017  . Post herpetic neuralgia 10/06/2016  . Hx of colonic polyps 09/22/2016  . Osteopenia 09/02/2016  . Anxiety associated with depression 08/25/2016  . Trapezius muscle spasm 05/25/2016  . Intermittent left lower quadrant abdominal pain 03/16/2016  . Anemia in chronic kidney disease (CKD) 02/26/2016  . Type 2 diabetes mellitus with stage 4 chronic kidney disease ([place]) 02/25/2016  . Hypothyroidism 02/25/2016  . Constipation 02/25/2016  . GERD (gastroesophageal reflux disease) 02/25/2016  . Hyperlipidemia associated with type 2 diabetes mellitus ([place]) 02/25/2016  . DJD (degenerative joint disease) of cervical spine 02/25/2016  . Osteoarthritis of multiple joints 02/25/2016  . Hiatal hernia 02/25/2016  . CKD stage 3 due to type 2 diabetes mellitus ([place]) 02/25/2016  . Chronic kidney disease, unspecified 09/05/2013  . Benign hypertension with CKD (chronic kidney disease) stage III ([place]) 09/05/2013  . CMC arthritis, thumb, degenerative 06/15/2013  . Onychomycosis due to dermatophyte 05/25/2013  . Acquired keratoderma 05/25/2013    Rosalyn Gess OTR/l,CLT 04/16/2020, 4:45 PM  [facility] [address], [place], [address] Phone: [phone]   Fax:  [phone]  Name: LOVEY CRUPI MRN: [id number] Date of Birth: [birth year]

## 2020-04-18 ENCOUNTER — Ambulatory Visit: Payer: PPO | Admitting: Occupational Therapy

## 2020-04-22 ENCOUNTER — Telehealth: Payer: Self-pay

## 2020-04-22 NOTE — Telephone Encounter (Signed)
947 am.  Phone call made to patient to complete a telephonic visit.  Patient reports her youngest son passed away over the weekend at the [facility]  She is in the process of completing funeral arrangements.  Condolences offered to patient on the recent passing of her son.  Patient states she has a lot on her mind and is trying to work through all that is going on within her family and her daughter-in-law's family.  Patient states she is going to outpatient therapy and she is doing well with this.  She feels this is somewhat of a burden for her oldest son as he is taking her to appointments.  Patient currently has a private caregiver coming in 1 x a week.  She is in the process of asking if this can be increased to 2 x a week to offer more assistance.  Patient is using a cane when she is out of the home and denies any falls.  She feels overall physically she is doing okay.  Advised that Palliative Care would outreach patient again next month.  I have provided my contact information should she need to reach the team prior to our next call.

## 2020-04-23 ENCOUNTER — Ambulatory Visit: Payer: PPO | Admitting: Occupational Therapy

## 2020-04-25 ENCOUNTER — Ambulatory Visit: Payer: PPO | Attending: Family Medicine | Admitting: Occupational Therapy

## 2020-04-25 ENCOUNTER — Other Ambulatory Visit: Payer: Self-pay

## 2020-04-25 DIAGNOSIS — M6281 Muscle weakness (generalized): Secondary | ICD-10-CM | POA: Diagnosis not present

## 2020-04-25 DIAGNOSIS — M25631 Stiffness of right wrist, not elsewhere classified: Secondary | ICD-10-CM

## 2020-04-25 DIAGNOSIS — G5631 Lesion of radial nerve, right upper limb: Secondary | ICD-10-CM | POA: Insufficient documentation

## 2020-04-25 DIAGNOSIS — M25621 Stiffness of right elbow, not elsewhere classified: Secondary | ICD-10-CM

## 2020-04-25 DIAGNOSIS — M25641 Stiffness of right hand, not elsewhere classified: Secondary | ICD-10-CM | POA: Diagnosis not present

## 2020-04-25 NOTE — Therapy (Signed)
[facility] [address], [place], [address] Phone: [phone]   Fax:  [phone]  Occupational Therapy Treatment  Patient Details  Name: Lisa Mills MRN: [id number] Date of Birth: 02-Jul-1929 Referring Provider (OT): Dr Marcelino Scot   Encounter Date: 04/25/2020   OT End of Session - 04/25/20 1911    Visit Number 3    Number of Visits 16    Date for OT Re-Evaluation 06/05/20    OT Start Time 1531    OT Stop Time 1642    OT Time Calculation (min) 71 min    Activity Tolerance Patient tolerated treatment well    Behavior During Therapy [facility] for tasks assessed/performed           Past Medical History:  Diagnosis Date  . Anemia   . Anxiety   . Chronic kidney disease    stage 3-4  . Constipation   . Depression   . Dermatophytosis of nail   . Diabetes mellitus without complication ([place])    Diet controlled, lost weight, no meds  . GERD (gastroesophageal reflux disease)   . Hemorrhoids   . History of shingles   . HLD (hyperlipidemia)   . Hypertension   . Hypothyroidism   . Keratoderma   . Restless legs syndrome (RLS)    hx    Past Surgical History:  Procedure Laterality Date  . BREAST BIOPSY Right 09/30/2017   Affirm bx-calcs ( X clip),benign  . BREAST EXCISIONAL BIOPSY Right    neg  . cataracts    . COLONOSCOPY W/ POLYPECTOMY    . COLONOSCOPY WITH PROPOFOL N/A 02/10/2017   Procedure: COLONOSCOPY WITH PROPOFOL;  Surgeon: Manya Silvas, MD;  Location: [facility] ENDOSCOPY;  Service: Endoscopy;  Laterality: N/A;  . ESOPHAGOGASTRODUODENOSCOPY (EGD) WITH PROPOFOL N/A 02/10/2017   Procedure: ESOPHAGOGASTRODUODENOSCOPY (EGD) WITH PROPOFOL;  Surgeon: Manya Silvas, MD;  Location: [facility] ENDOSCOPY;  Service: Endoscopy;  Laterality: N/A;  . EYE SURGERY     bilateral cataracts  . HAND SURGERY    . KIDNEY STONE SURGERY    . ORIF HUMERUS FRACTURE Right 02/13/2020   Procedure: OPEN REDUCTION INTERNAL  FIXATION (ORIF) SUPRACONDYLAR  HUMERUS FRACTURE;  Surgeon: Altamese Keokea, MD;  Location: [place];  Service: Orthopedics;  Laterality: Right;  . SACROPLASTY N/A 07/06/2019   Procedure: SACROPLASTY;  Surgeon: Hessie Knows, MD;  Location: [facility] ORS;  Service: Orthopedics;  Laterality: N/A;  . TONSILECTOMY, ADENOIDECTOMY, BILATERAL MYRINGOTOMY AND TUBES    . TONSILLECTOMY    . TRIGGER FINGER RELEASE     Left ring finger  . tubercular peritonitis      There were no vitals filed for this visit.   Subjective Assessment - 04/25/20 1910    Subjective  I did not had a lot of time to execise- was not to good with my exercies - my son past away this past weekend - he was sick    Pertinent History Pt fell and ended up with R supracondylar fx - ORIF done by Dr Marcelino Scot -pt also with radial N palsy - was suppose to have [facility] PT but never got it -and refer now by Dr Marcelino Scot to outpt OT on 04/03/20 - for ROM and possible radial N palsy custom splint    Patient Stated Goals Want to use my R hand to eat, do my hair, makeup, open doors, cook and make bed as well as drive again    Currently in Pain? No/denies  Elbow extention this date -18, flexion 108 degrees Sup/pro 4/5 Wrist extention to neutral without gravity - but this date after PROM - able to place and hold 1/4 of motion- 1-2/5 strength against gravity UD and RD  AROM 2/5/without gravity Pt with decrease tightness in flexors of hand and wrist - can lay hand flat on table this date- but unable to tap digits  And extend MC's this date to 2nd thru 4th - 65 and 5th 90 degrees PIP full extention  Fitted with  wrist prefab splint to use during functional tasks at home last time- to provide stable wrist- pt able to cut food , feed self , pull and push door , turn doorknob and fold towel - with wrist splint on R hand  Done mold skin to webspace of splint to decrease irritation and proximal volar part      massage webspace -and then AROM for thumb  PA 10 reps  PROM for RA of thumb -10 reps   PROM and hold wrist extention stretch  10reps hold 5 sec  Done PROM , place and hold and AROM with finish AROM with PROM for wrist ext - pt to choose any of those to do at home   table slides for wrist extention on table - on towel - 20 reps last time- check on - and review with pt  Flatten hand and digits on table 5 min  Take L hand and tap each digit 5 reps MC and PIP extention over edge of table 10 reps - AAROM to finish ROM     Then sideways on table - on paper - facilitate wrist extention - partial - to midline- do AAROM using L hand and then attempt 5 reps - in between PROM stretch for wrist and digits extention - 10reps    Palm together for RD, UD - AAROM 10 reps  And also sliding on paper towel on table - 10 reps  AROM forearm sup/pro - elbow to side 10 reps         OT Treatments/Exercises (OP) - 04/25/20 0001      RUE Paraffin   Number Minutes Paraffin 10 Minutes    RUE Paraffin Location --   hand , wrist and elbow   Comments prior to soft tissue and stretches for elbow flexion           Done heatingpad and weight for elbow flexion stretch -and pt held for 4-5 min prior to PROM - in supine Soft tissue mobs by OT on volar wrist and forearm - mini massager on hand and wrist prior to ROM  PROM and extended flexion stretch in supine for elbow flexion - 4 x 1 min - and then pt ed on doing at home 3 x 30 sec  With AROM for elbow extention to hip in neutral and supination position      3x elbow flexion stretch 30 sec -and extention inbetween And then interlock fingers and do AAROM behind head sliding into elbow flexion  Elbow flexion at end of session 120 degrees         OT Education - 04/25/20 1911    Education Details progress and HEP    Person(s) Educated Patient;Child(ren)    Methods Explanation;Demonstration;Tactile cues;Verbal cues;Handout    Comprehension Verbal cues required;Returned  demonstration;Verbalized understanding            OT Short Term Goals - 04/10/20 1801      OT SHORT TERM GOAL #1  Title Pt to be independent in HEP to decrease tightness in flexors of R forearm and hand to initiate AROM for wrist and digits extention    Baseline Pt did not had any therapy since surgery - HH could not come - very tight and stiff -    Time 3    Period Weeks    Status New    Target Date 05/01/20             OT Long Term Goals - 04/10/20 1807      OT LONG TERM GOAL #1   Title R elbow flexion and extention increase with more than 20 degrees to apply deodorant, wash hair and bath chest and neck    Baseline Flexion 105 R elbow, extention -40 -    Time 4    Period Weeks    Status New    Target Date 05/08/20      OT LONG TERM GOAL #2   Title R wrist extention improve for pt to maintain wrist neutral for to bath herself and wipe counters    Baseline R wrist and digits flexors tight -and keep wrist in 60 degrees flexion - unable to open or extend MC's    Time 4    Period Weeks    Status New    Target Date 05/08/20      OT LONG TERM GOAL #3   Title R thumb AROM increase for pt to abd to grasp objects or utenciles of 1-2 inches    Baseline No AROM RA - PA 1/3 range without gravitiy - cannot grip any objects or release    Time 5    Period Weeks    Status New    Target Date 05/15/20      OT LONG TERM GOAL #4   Title R wrist AROM increase for pt to maintain wrist with grip to carry 1 lbs objects in ADL's    Baseline wrist drop - keep in 60 degrees flexion - cannot make fist - only when keep wrist with other hand at neutrral    Time 6    Period Weeks    Status New    Target Date 05/22/20      OT LONG TERM GOAL #5   Title Pt R elbow strength increase for pt to pick up or carry more than 4 lbs - wiht wrist splint if needed    Baseline no strength -just started therapy - radial nerve injury - wrist drop -and elbow AROM -40 and flexion 105    Time 8    Period  Weeks    Status New    Target Date 06/05/20                 Plan - 04/25/20 2016    Clinical Impression Statement Pt is 10 wks s/p R supracondylar fx with ORIF and radial N palsy - pt was not able to get [facility] and started outpt about 2 wks ago - and then son past away this past week - pt do show increase UD , RD without gravity and this date able to show wrist extention 1-2/5 place and hold against gravity , and 2/10 without gravity - extention of MC's to -65 2nd thru 4th - and elbow flexion coming in 108 and at end of ession 120 , extention -18 degrees- pt able to wash under her L axilla now per pt - cont ot increase pts PROM and AROM in R UE for elbow  distal to hand    OT Occupational Profile and History Problem Focused Assessment - Including review of records relating to presenting problem    Occupational performance deficits (Please refer to evaluation for details): ADL's;IADL's;Play;Rest and Sleep;Leisure;Social Participation    Body Structure / Function / Physical Skills ADL;Flexibility;ROM;UE functional use;FMC;Decreased knowledge of use of DME;Dexterity;Sensation;Strength;Pain;IADL    Rehab Potential Good    Clinical Decision Making Limited treatment options, no task modification necessary    Comorbidities Affecting Occupational Performance: None    Modification or Assistance to Complete Evaluation  No modification of tasks or assist necessary to complete eval    OT Frequency 2x / week    OT Duration 8 weeks    OT Treatment/Interventions Self-care/ADL training;Moist Heat;Paraffin;DME and/or AE instruction;Manual Therapy;Passive range of motion;Scar mobilization;Splinting;Patient/family education;Therapeutic exercise    Consulted and Agree with Plan of Care Patient           Patient will benefit from skilled therapeutic intervention in order to improve the following deficits and impairments:   Body Structure / Function / Physical Skills: ADL,Flexibility,ROM,UE functional  use,FMC,Decreased knowledge of use of DME,Dexterity,Sensation,Strength,Pain,IADL       Visit Diagnosis: Compression of right radial nerve  Stiffness of right elbow, not elsewhere classified  Stiffness of right wrist, not elsewhere classified  Stiffness of right hand, not elsewhere classified  Muscle weakness (generalized)    Problem List Patient Active Problem List   Diagnosis Date Noted  . Right wrist drop 03/25/2020  . Psychophysiological insomnia 10/06/2019  . Nonintractable headache 10/06/2019  . Urinary incontinence 10/06/2019  . Internal and external bleeding hemorrhoids 08/15/2019  . Sacral pain   . Radicular pain of sacrum 07/11/2019  . Sacral fracture, closed ([place]) 07/04/2019  . Fall 07/04/2019  . Bilateral pubic rami fractures, sequela 07/04/2019  . Background diabetic retinopathy ([place]) 01/23/2019  . Schatzki's ring 03/04/2017  . Post herpetic neuralgia 10/06/2016  . Hx of colonic polyps 09/22/2016  . Osteopenia 09/02/2016  . Anxiety associated with depression 08/25/2016  . Trapezius muscle spasm 05/25/2016  . Intermittent left lower quadrant abdominal pain 03/16/2016  . Anemia in chronic kidney disease (CKD) 02/26/2016  . Type 2 diabetes mellitus with stage 4 chronic kidney disease ([place]) 02/25/2016  . Hypothyroidism 02/25/2016  . Constipation 02/25/2016  . GERD (gastroesophageal reflux disease) 02/25/2016  . Hyperlipidemia associated with type 2 diabetes mellitus ([place]) 02/25/2016  . DJD (degenerative joint disease) of cervical spine 02/25/2016  . Osteoarthritis of multiple joints 02/25/2016  . Hiatal hernia 02/25/2016  . CKD stage 3 due to type 2 diabetes mellitus ([place]) 02/25/2016  . Chronic kidney disease, unspecified 09/05/2013  . Benign hypertension with CKD (chronic kidney disease) stage III ([place]) 09/05/2013  . CMC arthritis, thumb, degenerative 06/15/2013  . Onychomycosis due to dermatophyte 05/25/2013  . Acquired keratoderma 05/25/2013     Rosalyn Gess OTR/L,CLT 04/25/2020, 8:21 PM  [facility] [address], [place], [address] Phone: [phone]   Fax:  [phone]  Name: Lisa Mills MRN: [id number] Date of Birth: 1929/12/10

## 2020-04-29 ENCOUNTER — Encounter: Payer: Self-pay | Admitting: Occupational Therapy

## 2020-04-29 ENCOUNTER — Ambulatory Visit: Payer: PPO | Admitting: Occupational Therapy

## 2020-04-29 ENCOUNTER — Other Ambulatory Visit: Payer: Self-pay

## 2020-04-29 DIAGNOSIS — M25631 Stiffness of right wrist, not elsewhere classified: Secondary | ICD-10-CM

## 2020-04-29 DIAGNOSIS — G5631 Lesion of radial nerve, right upper limb: Secondary | ICD-10-CM

## 2020-04-29 DIAGNOSIS — M25621 Stiffness of right elbow, not elsewhere classified: Secondary | ICD-10-CM

## 2020-04-29 DIAGNOSIS — M25641 Stiffness of right hand, not elsewhere classified: Secondary | ICD-10-CM

## 2020-04-29 DIAGNOSIS — M6281 Muscle weakness (generalized): Secondary | ICD-10-CM

## 2020-05-01 ENCOUNTER — Other Ambulatory Visit: Payer: Self-pay

## 2020-05-01 ENCOUNTER — Ambulatory Visit: Payer: PPO | Admitting: Occupational Therapy

## 2020-05-01 DIAGNOSIS — M25621 Stiffness of right elbow, not elsewhere classified: Secondary | ICD-10-CM

## 2020-05-01 DIAGNOSIS — G5631 Lesion of radial nerve, right upper limb: Secondary | ICD-10-CM

## 2020-05-01 DIAGNOSIS — M25641 Stiffness of right hand, not elsewhere classified: Secondary | ICD-10-CM

## 2020-05-01 DIAGNOSIS — M6281 Muscle weakness (generalized): Secondary | ICD-10-CM

## 2020-05-01 DIAGNOSIS — M25631 Stiffness of right wrist, not elsewhere classified: Secondary | ICD-10-CM

## 2020-05-01 NOTE — Therapy (Signed)
[facility] [address] [place], [address] Phone: [phone]   Fax:  [phone]  Occupational Therapy Treatment  Patient Details  Name: Lisa Mills MRN: [id number] Date of Birth: 19-Jul-1929 Referring Provider (OT): Dr Marcelino Scot   Encounter Date: 05/01/2020   OT End of Session - 05/01/20 1612    Visit Number 5    Number of Visits 16    Date for OT Re-Evaluation 06/05/20    OT Start Time 1615    OT Stop Time 1659    OT Time Calculation (min) 44 min    Activity Tolerance Patient tolerated treatment well    Behavior During Therapy [facility] for tasks assessed/performed           Past Medical History:  Diagnosis Date  . Anemia   . Anxiety   . Chronic kidney disease    stage 3-4  . Constipation   . Depression   . Dermatophytosis of nail   . Diabetes mellitus without complication ([place])    Diet controlled, lost weight, no meds  . GERD (gastroesophageal reflux disease)   . Hemorrhoids   . History of shingles   . HLD (hyperlipidemia)   . Hypertension   . Hypothyroidism   . Keratoderma   . Restless legs syndrome (RLS)    hx    Past Surgical History:  Procedure Laterality Date  . BREAST BIOPSY Right 09/30/2017   Affirm bx-calcs ( X clip),benign  . BREAST EXCISIONAL BIOPSY Right    neg  . cataracts    . COLONOSCOPY W/ POLYPECTOMY    . COLONOSCOPY WITH PROPOFOL N/A 02/10/2017   Procedure: COLONOSCOPY WITH PROPOFOL;  Surgeon: Manya Silvas, MD;  Location: [facility] ENDOSCOPY;  Service: Endoscopy;  Laterality: N/A;  . ESOPHAGOGASTRODUODENOSCOPY (EGD) WITH PROPOFOL N/A 02/10/2017   Procedure: ESOPHAGOGASTRODUODENOSCOPY (EGD) WITH PROPOFOL;  Surgeon: Manya Silvas, MD;  Location: [facility] ENDOSCOPY;  Service: Endoscopy;  Laterality: N/A;  . EYE SURGERY     bilateral cataracts  . HAND SURGERY    . KIDNEY STONE SURGERY    . ORIF HUMERUS FRACTURE Right 02/13/2020   Procedure: OPEN REDUCTION INTERNAL  FIXATION (ORIF) SUPRACONDYLAR  HUMERUS FRACTURE;  Surgeon: Altamese Sunbright, MD;  Location: [place];  Service: Orthopedics;  Laterality: Right;  . SACROPLASTY N/A 07/06/2019   Procedure: SACROPLASTY;  Surgeon: Hessie Knows, MD;  Location: [facility] ORS;  Service: Orthopedics;  Laterality: N/A;  . TONSILECTOMY, ADENOIDECTOMY, BILATERAL MYRINGOTOMY AND TUBES    . TONSILLECTOMY    . TRIGGER FINGER RELEASE     Left ring finger  . tubercular peritonitis      There were no vitals filed for this visit.   Subjective Assessment - 05/01/20 1612    Subjective  I cancelled thei next 2 Wednesday appt - has to many things that I need to do - but will do my exercises - did the bending of my elbow - try to touch my ear    Pertinent History Pt fell and ended up with R supracondylar fx - ORIF done by Dr Marcelino Scot -pt also with radial N palsy - was suppose to have [facility] PT but never got it -and refer now by Dr Marcelino Scot to outpt OT on 04/03/20 - for ROM and possible radial N palsy custom splint    Patient Stated Goals Want to use my R hand to eat, do my hair, makeup, open doors, cook and make bed as well as drive again  Currently in Pain? No/denies              [facility] OT Assessment - 05/01/20 0001      AROM   Right Elbow Flexion 118   in session                   OT Treatments/Exercises (OP) - 05/01/20 0001      RUE Paraffin   Number Minutes Paraffin 10 Minutes    RUE Paraffin Location --   elbow and hand   Comments elbow ext and flexion stretch 4 min each -           Fitted with  wrist prefab splint  Few wks ago to use during functional tasks at home last time- to provide stable wrist- pt able to cut food , feed self , pull and push door , turn doorknob and fold towel - with wrist splint on R hand   massage webspace -and then AROM for thumb PA 10 reps  PROM for RA of thumb -10 reps   PROM and hold wrist extention stretch  10reps hold 5 sec Done PROM , place and hold and AROM with finish AROM  with PROM for wrist ext - pt to choose any of those to do at home   table slides for wrist extention on table - on towel - 20 repslast time- check on - and review with pt  Flatten hand and digits on table 5 min  Take L hand and tap each digit 5 reps MC and PIP extention over edge of table 10 reps - AAROM to finish ROM     Then sideways on table - on paper - facilitate wrist extention - partial - to midline- do AAROM using L hand and then attempt 5 reps - in between PROM stretch for wrist and digits extention - 10reps  Palm together for RD, UD - AAROM 10 reps And also sliding on paper towel on table - 10 reps  AROM forearm sup/pro - elbow to side 10 reps  PROM for elbow ext and flexion- done 10 reps hold 1 min -after scar massage done to posterior elbow scar -with elbow in flexion  Done in supine Followed by elbow AROM reaching to ear and up to ceiling - shoulder at 90 flexion Add 1 lbs weight - with wrist brace on - elbow extention and flexion to ear - with shoulder 90 degrees flexion 2 x 2 reps 2 x 12 reps - 1 lbs reach for R ear and reach down to hip Add to HEP - pt has 1 lbs weight - borrowing           OT Education - 05/01/20 1612    Education Details progress and HEP    Person(s) Educated Patient;Child(ren)    Methods Explanation;Demonstration;Tactile cues;Verbal cues;Handout    Comprehension Verbal cues required;Returned demonstration;Verbalized understanding            OT Short Term Goals - 04/10/20 1801      OT SHORT TERM GOAL #1   Title Pt to be independent in HEP to decrease tightness in flexors of R forearm and hand to initiate AROM for wrist and digits extention    Baseline Pt did not had any therapy since surgery - HH could not come - very tight and stiff -    Time 3    Period Weeks    Status New    Target Date 05/01/20  OT Long Term Goals - 04/10/20 1807      OT LONG TERM GOAL #1   Title R elbow flexion and extention  increase with more than 20 degrees to apply deodorant, wash hair and bath chest and neck    Baseline Flexion 105 R elbow, extention -40 -    Time 4    Period Weeks    Status New    Target Date 05/08/20      OT LONG TERM GOAL #2   Title R wrist extention improve for pt to maintain wrist neutral for to bath herself and wipe counters    Baseline R wrist and digits flexors tight -and keep wrist in 60 degrees flexion - unable to open or extend MC's    Time 4    Period Weeks    Status New    Target Date 05/08/20      OT LONG TERM GOAL #3   Title R thumb AROM increase for pt to abd to grasp objects or utenciles of 1-2 inches    Baseline No AROM RA - PA 1/3 range without gravitiy - cannot grip any objects or release    Time 5    Period Weeks    Status New    Target Date 05/15/20      OT LONG TERM GOAL #4   Title R wrist AROM increase for pt to maintain wrist with grip to carry 1 lbs objects in ADL's    Baseline wrist drop - keep in 60 degrees flexion - cannot make fist - only when keep wrist with other hand at neutrral    Time 6    Period Weeks    Status New    Target Date 05/22/20      OT LONG TERM GOAL #5   Title Pt R elbow strength increase for pt to pick up or carry more than 4 lbs - wiht wrist splint if needed    Baseline no strength -just started therapy - radial nerve injury - wrist drop -and elbow AROM -40 and flexion 105    Time 8    Period Weeks    Status New    Target Date 06/05/20                 Plan - 05/01/20 1612    Clinical Impression Statement Pt is 11 wks s/p R supracondylar fx with ORIF and radial N palsy.  She is currently dealing with her son's recent death and trying to get his estate sorted out and completed.   Cancelling to 1 x wk for the next 2-3 wks - Patient continues to show progress with range of motion, improvements noted at the elbow, wrist and hand.  Difficulty with wrist extension with gravity, improving with gravity eliminated, fingers with  some ADD/ABD but has difficulty with tapping.  Finger extension for partial range from fisted position with opening.  Elbow flexion and extension continues to improve  and add 1 lbs weight this date - but pt to use wrist splint.  Monitor thumb area for pressure and irritation.  Continue to work towards goals in plan of care to maximize safety and independence with necessary tasks at home and in the community.    OT Occupational Profile and History Problem Focused Assessment - Including review of records relating to presenting problem    Occupational performance deficits (Please refer to evaluation for details): ADL's;IADL's;Play;Rest and Sleep;Leisure;Social Participation    Body Structure / Function / Physical Skills ADL;Flexibility;ROM;UE  functional use;FMC;Decreased knowledge of use of DME;Dexterity;Sensation;Strength;Pain;IADL    Rehab Potential Good    Clinical Decision Making Limited treatment options, no task modification necessary    Comorbidities Affecting Occupational Performance: None    Modification or Assistance to Complete Evaluation  No modification of tasks or assist necessary to complete eval    OT Frequency 2x / week    OT Duration 8 weeks    OT Treatment/Interventions Self-care/ADL training;Moist Heat;Paraffin;DME and/or AE instruction;Manual Therapy;Passive range of motion;Scar mobilization;Splinting;Patient/family education;Therapeutic exercise    Consulted and Agree with Plan of Care Patient           Patient will benefit from skilled therapeutic intervention in order to improve the following deficits and impairments:   Body Structure / Function / Physical Skills: ADL,Flexibility,ROM,UE functional use,FMC,Decreased knowledge of use of DME,Dexterity,Sensation,Strength,Pain,IADL       Visit Diagnosis: Compression of right radial nerve  Stiffness of right elbow, not elsewhere classified  Stiffness of right wrist, not elsewhere classified  Stiffness of right hand, not  elsewhere classified  Muscle weakness (generalized)    Problem List Patient Active Problem List   Diagnosis Date Noted  . Right wrist drop 03/25/2020  . Psychophysiological insomnia 10/06/2019  . Nonintractable headache 10/06/2019  . Urinary incontinence 10/06/2019  . Internal and external bleeding hemorrhoids 08/15/2019  . Sacral pain   . Radicular pain of sacrum 07/11/2019  . Sacral fracture, closed ([place]) 07/04/2019  . Fall 07/04/2019  . Bilateral pubic rami fractures, sequela 07/04/2019  . Background diabetic retinopathy ([place]) 01/23/2019  . Schatzki's ring 03/04/2017  . Post herpetic neuralgia 10/06/2016  . Hx of colonic polyps 09/22/2016  . Osteopenia 09/02/2016  . Anxiety associated with depression 08/25/2016  . Trapezius muscle spasm 05/25/2016  . Intermittent left lower quadrant abdominal pain 03/16/2016  . Anemia in chronic kidney disease (CKD) 02/26/2016  . Type 2 diabetes mellitus with stage 4 chronic kidney disease ([place]) 02/25/2016  . Hypothyroidism 02/25/2016  . Constipation 02/25/2016  . GERD (gastroesophageal reflux disease) 02/25/2016  . Hyperlipidemia associated with type 2 diabetes mellitus ([place]) 02/25/2016  . DJD (degenerative joint disease) of cervical spine 02/25/2016  . Osteoarthritis of multiple joints 02/25/2016  . Hiatal hernia 02/25/2016  . CKD stage 3 due to type 2 diabetes mellitus ([place]) 02/25/2016  . Chronic kidney disease, unspecified 09/05/2013  . Benign hypertension with CKD (chronic kidney disease) stage III ([place]) 09/05/2013  . CMC arthritis, thumb, degenerative 06/15/2013  . Onychomycosis due to dermatophyte 05/25/2013  . Acquired keratoderma 05/25/2013    Rosalyn Gess OTR/L,CLT 05/01/2020, 6:02 PM  [facility] [address], [place], [address] Phone: [phone]   Fax:  [phone]  Name: Lisa Mills MRN: [id number] Date of Birth: 09-21-1929

## 2020-05-01 NOTE — Therapy (Signed)
[facility] [address], [place], [address] Phone: [phone]   Fax:  [phone]  Occupational Therapy Treatment  Patient Details  Name: Lisa Mills MRN: [id number] Date of Birth: Oct 31, 1929 Referring Provider (OT): Dr Marcelino Scot   Encounter Date: 04/29/2020   OT End of Session - 05/01/20 1334    Visit Number 4    Number of Visits 16    Date for OT Re-Evaluation 06/05/20    OT Start Time 1601    OT Stop Time 1650    OT Time Calculation (min) 49 min    Activity Tolerance Patient tolerated treatment well    Behavior During Therapy [facility] for tasks assessed/performed           Past Medical History:  Diagnosis Date  . Anemia   . Anxiety   . Chronic kidney disease    stage 3-4  . Constipation   . Depression   . Dermatophytosis of nail   . Diabetes mellitus without complication ([place])    Diet controlled, lost weight, no meds  . GERD (gastroesophageal reflux disease)   . Hemorrhoids   . History of shingles   . HLD (hyperlipidemia)   . Hypertension   . Hypothyroidism   . Keratoderma   . Restless legs syndrome (RLS)    hx    Past Surgical History:  Procedure Laterality Date  . BREAST BIOPSY Right 09/30/2017   Affirm bx-calcs ( X clip),benign  . BREAST EXCISIONAL BIOPSY Right    neg  . cataracts    . COLONOSCOPY W/ POLYPECTOMY    . COLONOSCOPY WITH PROPOFOL N/A 02/10/2017   Procedure: COLONOSCOPY WITH PROPOFOL;  Surgeon: Manya Silvas, MD;  Location[facility] ENDOSCOPY;  Service: Endoscopy;  Laterality: N/A;  . ESOPHAGOGASTRODUODENOSCOPY (EGD) WITH PROPOFOL N/A 02/10/2017   Procedure: ESOPHAGOGASTRODUODENOSCOPY (EGD) WITH PROPOFOL;  Surgeon: Manya Silvas, MD;  Location: [facility] ENDOSCOPY;  Service: Endoscopy;  Laterality: N/A;  . EYE SURGERY     bilateral cataracts  . HAND SURGERY    . KIDNEY STONE SURGERY    . ORIF HUMERUS FRACTURE Right 02/13/2020   Procedure: OPEN REDUCTION INTERNAL  FIXATION (ORIF) SUPRACONDYLAR  HUMERUS FRACTURE;  Surgeon: Altamese Palmer, MD;  Location: [place];  Service: Orthopedics;  Laterality: Right;  . SACROPLASTY N/A 07/06/2019   Procedure: SACROPLASTY;  Surgeon: Hessie Knows, MD;  Location: [facility] ORS;  Service: Orthopedics;  Laterality: N/A;  . TONSILECTOMY, ADENOIDECTOMY, BILATERAL MYRINGOTOMY AND TUBES    . TONSILLECTOMY    . TRIGGER FINGER RELEASE     Left ring finger  . tubercular peritonitis      There were no vitals filed for this visit.   Subjective Assessment - 04/30/20 1333    Subjective  Pt reports she is doing exercises at home.  "How long does it take for this wrist to get better?"  Pt reports she is trying to deal with her son's estate since he passed away recently.    Pertinent History Pt fell and ended up with R supracondylar fx - ORIF done by Dr Marcelino Scot -pt also with radial N palsy - was suppose to have [facility] PT but never got it -and refer now by Dr Marcelino Scot to outpt OT on 04/03/20 - for ROM and possible radial N palsy custom splint    Patient Stated Goals Want to use my R hand to eat, do my hair, makeup, open doors, cook and make bed as well as drive again  Currently in Pain? No/denies    Pain Score 0-No pain            Paraffin to R elbow and hand for 10 mins prior to therapeutic exercise to increase ROM, decrease pain and prepare arm for exercise.  Moist heat to right elbow while in supine with PROM followed by AAROM in supine for elbow flexion/extension.  Therapist performing scar massage to right elbow with prolonged flexion stretch to decrease scar tissue, increase tissue extensibility and improve ROM.   Elbow flexion to 105 degrees extension to -20 degrees at beginning of session, 122 degrees at end flexion, -15 extension Pt interlocking hands and working to place hands under head and then remove and perform elbow extension, 5 reps each.    Massage to right thumb webspace to decrease tightness and increase ROM prior to attempts  and thumb and finger ROM.  Hand on table with attempts to perform finger ABD/ADD, unable to perform tapping of digits, therapist taking patient through the range of motion and multiple attempts for active movement.  Pt performing active finger flexion followed by finger extension for partial range with therapist assisting towards mid to end ranges for multiple trials and sets. UD, RD with assist with gravity, attempts at place and hold with wrist in neutral with and without gravity and attempts to move into slight extension.  Wrist extension stretch by therapist  Pt reports she removed moleskin around the thumb area of her splint due to it irritating her, replaced in with a soft cotton and prefers to keep it this way.    Response to tx:  Pt is 10.5 wks s/p R supracondylar fx with ORIF and radial N palsy.  She is currently dealing with her son's recent death and trying to get his estate sorted out and completed.  Patient continues to show progress with range of motion, improvements noted at the elbow, wrist and hand.  Difficulty with wrist extension with gravity, improving with gravity eliminated, fingers with some ADD/ABD but has difficulty with tapping.  Finger extension for partial range from fisted position with opening.  Elbow flexion and extension continues to improve as noted above.  Monitor thumb area for pressure and irritation.  Continue to work towards goals in plan of care to maximize safety and independence with necessary tasks at home and in the community.                 OT Education - 05/01/20 1334    Education Details progress and HEP    Person(s) Educated Patient;Child(ren)    Methods Explanation;Demonstration;Tactile cues;Verbal cues;Handout    Comprehension Verbal cues required;Returned demonstration;Verbalized understanding            OT Short Term Goals - 04/10/20 1801      OT SHORT TERM GOAL #1   Title Pt to be independent in HEP to decrease tightness in flexors  of R forearm and hand to initiate AROM for wrist and digits extention    Baseline Pt did not had any therapy since surgery - HH could not come - very tight and stiff -    Time 3    Period Weeks    Status New    Target Date 05/01/20             OT Long Term Goals - 04/10/20 1807      OT LONG TERM GOAL #1   Title R elbow flexion and extention increase with more than 20 degrees to apply deodorant,  wash hair and bath chest and neck    Baseline Flexion 105 R elbow, extention -40 -    Time 4    Period Weeks    Status New    Target Date 05/08/20      OT LONG TERM GOAL #2   Title R wrist extention improve for pt to maintain wrist neutral for to bath herself and wipe counters    Baseline R wrist and digits flexors tight -and keep wrist in 60 degrees flexion - unable to open or extend MC's    Time 4    Period Weeks    Status New    Target Date 05/08/20      OT LONG TERM GOAL #3   Title R thumb AROM increase for pt to abd to grasp objects or utenciles of 1-2 inches    Baseline No AROM RA - PA 1/3 range without gravitiy - cannot grip any objects or release    Time 5    Period Weeks    Status New    Target Date 05/15/20      OT LONG TERM GOAL #4   Title R wrist AROM increase for pt to maintain wrist with grip to carry 1 lbs objects in ADL's    Baseline wrist drop - keep in 60 degrees flexion - cannot make fist - only when keep wrist with other hand at neutrral    Time 6    Period Weeks    Status New    Target Date 05/22/20      OT LONG TERM GOAL #5   Title Pt R elbow strength increase for pt to pick up or carry more than 4 lbs - wiht wrist splint if needed    Baseline no strength -just started therapy - radial nerve injury - wrist drop -and elbow AROM -40 and flexion 105    Time 8    Period Weeks    Status New    Target Date 06/05/20                 Plan - 04/30/20 1334    Clinical Impression Statement Pt is 10.5 wks s/p R supracondylar fx with ORIF and radial N  palsy.  She is currently dealing with her son's recent death and trying to get his estate sorted out and completed.  Patient continues to show progress with range of motion, improvements noted at the elbow, wrist and hand.  Difficulty with wrist extension with gravity, improving with gravity eliminated, fingers with some ADD/ABD but has difficulty with tapping.  Finger extension for partial range from fisted position with opening.  Elbow flexion and extension continues to improve as noted above.  Monitor thumb area for pressure and irritation.  Continue to work towards goals in plan of care to maximize safety and independence with necessary tasks at home and in the community.    OT Occupational Profile and History Problem Focused Assessment - Including review of records relating to presenting problem    Occupational performance deficits (Please refer to evaluation for details): ADL's;IADL's;Play;Rest and Sleep;Leisure;Social Participation    Body Structure / Function / Physical Skills ADL;Flexibility;ROM;UE functional use;FMC;Decreased knowledge of use of DME;Dexterity;Sensation;Strength;Pain;IADL    Rehab Potential Good    Clinical Decision Making Limited treatment options, no task modification necessary    Comorbidities Affecting Occupational Performance: None    Modification or Assistance to Complete Evaluation  No modification of tasks or assist necessary to complete eval    OT Frequency  2x / week    OT Duration 8 weeks    OT Treatment/Interventions Self-care/ADL training;Moist Heat;Paraffin;DME and/or AE instruction;Manual Therapy;Passive range of motion;Scar mobilization;Splinting;Patient/family education;Therapeutic exercise    Consulted and Agree with Plan of Care Patient           Patient will benefit from skilled therapeutic intervention in order to improve the following deficits and impairments:   Body Structure / Function / Physical Skills: ADL,Flexibility,ROM,UE functional  use,FMC,Decreased knowledge of use of DME,Dexterity,Sensation,Strength,Pain,IADL       Visit Diagnosis: Muscle weakness (generalized)  Compression of right radial nerve  Stiffness of right elbow, not elsewhere classified  Stiffness of right wrist, not elsewhere classified  Stiffness of right hand, not elsewhere classified    Problem List Patient Active Problem List   Diagnosis Date Noted  . Right wrist drop 03/25/2020  . Psychophysiological insomnia 10/06/2019  . Nonintractable headache 10/06/2019  . Urinary incontinence 10/06/2019  . Internal and external bleeding hemorrhoids 08/15/2019  . Sacral pain   . Radicular pain of sacrum 07/11/2019  . Sacral fracture, closed ([place]) 07/04/2019  . Fall 07/04/2019  . Bilateral pubic rami fractures, sequela 07/04/2019  . Background diabetic retinopathy ([place]) 01/23/2019  . Schatzki's ring 03/04/2017  . Post herpetic neuralgia 10/06/2016  . Hx of colonic polyps 09/22/2016  . Osteopenia 09/02/2016  . Anxiety associated with depression 08/25/2016  . Trapezius muscle spasm 05/25/2016  . Intermittent left lower quadrant abdominal pain 03/16/2016  . Anemia in chronic kidney disease (CKD) 02/26/2016  . Type 2 diabetes mellitus with stage 4 chronic kidney disease ([place]) 02/25/2016  . Hypothyroidism 02/25/2016  . Constipation 02/25/2016  . GERD (gastroesophageal reflux disease) 02/25/2016  . Hyperlipidemia associated with type 2 diabetes mellitus ([place]) 02/25/2016  . DJD (degenerative joint disease) of cervical spine 02/25/2016  . Osteoarthritis of multiple joints 02/25/2016  . Hiatal hernia 02/25/2016  . CKD stage 3 due to type 2 diabetes mellitus ([place]) 02/25/2016  . Chronic kidney disease, unspecified 09/05/2013  . Benign hypertension with CKD (chronic kidney disease) stage III ([place]) 09/05/2013  . CMC arthritis, thumb, degenerative 06/15/2013  . Onychomycosis due to dermatophyte 05/25/2013  . Acquired keratoderma 05/25/2013   Sugey Trevathan T  Joey Hudock, OTR/L, CLT  Taniaya Rudder 05/01/2020, 2:58 PM  [facility] [address] [place], [address] Phone: [phone]   Fax:  [phone]  Name: RAVENNA LEGORE MRN: [id number] Date of Birth: 03-28-1929

## 2020-05-06 ENCOUNTER — Ambulatory Visit: Payer: PPO | Admitting: Occupational Therapy

## 2020-05-06 ENCOUNTER — Other Ambulatory Visit: Payer: Self-pay

## 2020-05-06 DIAGNOSIS — M6281 Muscle weakness (generalized): Secondary | ICD-10-CM

## 2020-05-06 DIAGNOSIS — M25621 Stiffness of right elbow, not elsewhere classified: Secondary | ICD-10-CM

## 2020-05-06 DIAGNOSIS — G5631 Lesion of radial nerve, right upper limb: Secondary | ICD-10-CM

## 2020-05-06 DIAGNOSIS — M25631 Stiffness of right wrist, not elsewhere classified: Secondary | ICD-10-CM

## 2020-05-06 DIAGNOSIS — M25641 Stiffness of right hand, not elsewhere classified: Secondary | ICD-10-CM

## 2020-05-08 ENCOUNTER — Encounter: Payer: PPO | Admitting: Occupational Therapy

## 2020-05-10 ENCOUNTER — Encounter: Payer: Self-pay | Admitting: Occupational Therapy

## 2020-05-10 NOTE — Therapy (Signed)
[place] [facility] [address], [place], [address] Phone: [phone]   Fax:  [phone]  Occupational Therapy Treatment  Patient Details  Name: Lisa Mills MRN: [id number] Date of Birth: 13-Jan-1930 Referring Provider (OT): Dr Marcelino Scot   Encounter Date: 05/06/2020   OT End of Session - 05/10/20 1741    Visit Number 6    Number of Visits 16    Date for OT Re-Evaluation 06/05/20    OT Start Time 1600    OT Stop Time 1649    OT Time Calculation (min) 49 min    Activity Tolerance Patient tolerated treatment well    Behavior During Therapy [facility] for tasks assessed/performed           Past Medical History:  Diagnosis Date  . Anemia   . Anxiety   . Chronic kidney disease    stage 3-4  . Constipation   . Depression   . Dermatophytosis of nail   . Diabetes mellitus without complication ([place])    Diet controlled, lost weight, no meds  . GERD (gastroesophageal reflux disease)   . Hemorrhoids   . History of shingles   . HLD (hyperlipidemia)   . Hypertension   . Hypothyroidism   . Keratoderma   . Restless legs syndrome (RLS)    hx    Past Surgical History:  Procedure Laterality Date  . BREAST BIOPSY Right 09/30/2017   Affirm bx-calcs ( X clip),benign  . BREAST EXCISIONAL BIOPSY Right    neg  . cataracts    . COLONOSCOPY W/ POLYPECTOMY    . COLONOSCOPY WITH PROPOFOL N/A 02/10/2017   Procedure: COLONOSCOPY WITH PROPOFOL;  Surgeon: Manya Silvas, MD;  Location: [facility] ENDOSCOPY;  Service: Endoscopy;  Laterality: N/A;  . ESOPHAGOGASTRODUODENOSCOPY (EGD) WITH PROPOFOL N/A 02/10/2017   Procedure: ESOPHAGOGASTRODUODENOSCOPY (EGD) WITH PROPOFOL;  Surgeon: Manya Silvas, MD;  Location: [facility] ENDOSCOPY;  Service: Endoscopy;  Laterality: N/A;  . EYE SURGERY     bilateral cataracts  . HAND SURGERY    . KIDNEY STONE SURGERY    . ORIF HUMERUS FRACTURE Right 02/13/2020   Procedure: OPEN REDUCTION INTERNAL  FIXATION (ORIF) SUPRACONDYLAR  HUMERUS FRACTURE;  Surgeon: Altamese Belvoir, MD;  Location: [place];  Service: Orthopedics;  Laterality: Right;  . SACROPLASTY N/A 07/06/2019   Procedure: SACROPLASTY;  Surgeon: Hessie Knows, MD;  Location: [facility] ORS;  Service: Orthopedics;  Laterality: N/A;  . TONSILECTOMY, ADENOIDECTOMY, BILATERAL MYRINGOTOMY AND TUBES    . TONSILLECTOMY    . TRIGGER FINGER RELEASE     Left ring finger  . tubercular peritonitis      There were no vitals filed for this visit.   Subjective Assessment - 05/10/20 1739    Subjective  Pt reports she is doing well, had a good Easter holiday, her kids came to help with cleaning and moving items of her sons on Friday    Pertinent History Pt fell and ended up with R supracondylar fx - ORIF done by Dr Marcelino Scot -pt also with radial N palsy - was suppose to have [facility] PT but never got it -and refer now by Dr Marcelino Scot to outpt OT on 04/03/20 - for ROM and possible radial N palsy custom splint    Patient Stated Goals Want to use my R hand to eat, do my hair, makeup, open doors, cook and make bed as well as drive again    Currently in Pain? No/denies    Pain Score  0-No pain           Paraffin to RUE elbow and hand to decrease pain, increase motion and prepare arm for therapeutic exercise   Moist heat to right elbow while in supine with PROM followed by AAROM in supine for elbow flexion/extension.  Therapist performing scar massage to right elbow with prolonged flexion stretch to decrease scar tissue, increase tissue extensibility and improve ROM.   Pt interlocking hands and working to place hands under head and then remove and perform elbow extension, 5 reps each.  1 lbs weight - with wrist brace on - elbow extention and flexion to ear - with shoulder 90 degrees flexion 2 x 2 reps 2 x 12 reps - 1 lbs reach for R ear and reach down to hip  Massage to webspace followed by AROM for thumb PA 10 reps  PROM for RA of thumb -10 reps     PROM and hold  wrist extension stretch 10reps hold 5 sec    table slides for wrist extension on table - on towel - 20 reps with cues   Flatten hand and digits on table 5 min  Take L hand and tap each digit 5 reps MC and PIP extension over edge of table 10 reps - AAROM to finish ROM  Response to tx: Pt is 12 wks s/p R supracondylar fx with ORIF and radial N palsy.  She is currently dealing with her son's recent death and trying to get his estate sorted out and completed.   Cancelling to 1 x wk for the next 2-3 wks.  She continues to progress with ROM and exercises for elbow, wrist and hand. Continues to demonstrate difficulty with active finger movement for extension, ABD, ADD of fingers.  Continue to work towards goals in plan of care to improve ROM, strength and use of RUE In daily tasks.                OT Education - 05/10/20 1741    Education Details HEP    Person(s) Educated Patient;Child(ren)    Methods Explanation;Demonstration;Tactile cues;Verbal cues;Handout    Comprehension Verbal cues required;Returned demonstration;Verbalized understanding            OT Short Term Goals - 04/10/20 1801      OT SHORT TERM GOAL #1   Title Pt to be independent in HEP to decrease tightness in flexors of R forearm and hand to initiate AROM for wrist and digits extention    Baseline Pt did not had any therapy since surgery - HH could not come - very tight and stiff -    Time 3    Period Weeks    Status New    Target Date 05/01/20             OT Long Term Goals - 04/10/20 1807      OT LONG TERM GOAL #1   Title R elbow flexion and extention increase with more than 20 degrees to apply deodorant, wash hair and bath chest and neck    Baseline Flexion 105 R elbow, extention -40 -    Time 4    Period Weeks    Status New    Target Date 05/08/20      OT LONG TERM GOAL #2   Title R wrist extention improve for pt to maintain wrist neutral for to bath herself and wipe counters    Baseline R wrist  and digits flexors tight -and keep wrist in 60  degrees flexion - unable to open or extend MC's    Time 4    Period Weeks    Status New    Target Date 05/08/20      OT LONG TERM GOAL #3   Title R thumb AROM increase for pt to abd to grasp objects or utenciles of 1-2 inches    Baseline No AROM RA - PA 1/3 range without gravitiy - cannot grip any objects or release    Time 5    Period Weeks    Status New    Target Date 05/15/20      OT LONG TERM GOAL #4   Title R wrist AROM increase for pt to maintain wrist with grip to carry 1 lbs objects in ADL's    Baseline wrist drop - keep in 60 degrees flexion - cannot make fist - only when keep wrist with other hand at neutrral    Time 6    Period Weeks    Status New    Target Date 05/22/20      OT LONG TERM GOAL #5   Title Pt R elbow strength increase for pt to pick up or carry more than 4 lbs - wiht wrist splint if needed    Baseline no strength -just started therapy - radial nerve injury - wrist drop -and elbow AROM -40 and flexion 105    Time 8    Period Weeks    Status New    Target Date 06/05/20                 Plan - 05/10/20 1741    Clinical Impression Statement Pt is 12 wks s/p R supracondylar fx with ORIF and radial N palsy.  She is currently dealing with her son's recent death and trying to get his estate sorted out and completed.   Cancelling to 1 x wk for the next 2-3 wks.  She continues to progress with ROM and exercises for elbow, wrist and hand. Continues to demonstrate difficulty with active finger movement for extension, ABD, ADD of fingers.  Continue to work towards goals in plan of care to improve ROM, strength and use of RUE In daily tasks.    OT Occupational Profile and History Problem Focused Assessment - Including review of records relating to presenting problem    Occupational performance deficits (Please refer to evaluation for details): ADL's;IADL's;Play;Rest and Sleep;Leisure;Social Participation    Body  Structure / Function / Physical Skills ADL;Flexibility;ROM;UE functional use;FMC;Decreased knowledge of use of DME;Dexterity;Sensation;Strength;Pain;IADL    Rehab Potential Good    Clinical Decision Making Limited treatment options, no task modification necessary    Comorbidities Affecting Occupational Performance: None    Modification or Assistance to Complete Evaluation  No modification of tasks or assist necessary to complete eval    OT Frequency 2x / week    OT Duration 8 weeks    OT Treatment/Interventions Self-care/ADL training;Moist Heat;Paraffin;DME and/or AE instruction;Manual Therapy;Passive range of motion;Scar mobilization;Splinting;Patient/family education;Therapeutic exercise    Consulted and Agree with Plan of Care Patient           Patient will benefit from skilled therapeutic intervention in order to improve the following deficits and impairments:   Body Structure / Function / Physical Skills: ADL,Flexibility,ROM,UE functional use,FMC,Decreased knowledge of use of DME,Dexterity,Sensation,Strength,Pain,IADL       Visit Diagnosis: Compression of right radial nerve  Stiffness of right elbow, not elsewhere classified  Stiffness of right wrist, not elsewhere classified  Stiffness of right  hand, not elsewhere classified  Muscle weakness (generalized)    Problem List Patient Active Problem List   Diagnosis Date Noted  . Right wrist drop 03/25/2020  . Psychophysiological insomnia 10/06/2019  . Nonintractable headache 10/06/2019  . Urinary incontinence 10/06/2019  . Internal and external bleeding hemorrhoids 08/15/2019  . Sacral pain   . Radicular pain of sacrum 07/11/2019  . Sacral fracture, closed ([place]) 07/04/2019  . Fall 07/04/2019  . Bilateral pubic rami fractures, sequela 07/04/2019  . Background diabetic retinopathy ([place]) 01/23/2019  . Schatzki's ring 03/04/2017  . Post herpetic neuralgia 10/06/2016  . Hx of colonic polyps 09/22/2016  . Osteopenia  09/02/2016  . Anxiety associated with depression 08/25/2016  . Trapezius muscle spasm 05/25/2016  . Intermittent left lower quadrant abdominal pain 03/16/2016  . Anemia in chronic kidney disease (CKD) 02/26/2016  . Type 2 diabetes mellitus with stage 4 chronic kidney disease ([place]) 02/25/2016  . Hypothyroidism 02/25/2016  . Constipation 02/25/2016  . GERD (gastroesophageal reflux disease) 02/25/2016  . Hyperlipidemia associated with type 2 diabetes mellitus ([place]) 02/25/2016  . DJD (degenerative joint disease) of cervical spine 02/25/2016  . Osteoarthritis of multiple joints 02/25/2016  . Hiatal hernia 02/25/2016  . CKD stage 3 due to type 2 diabetes mellitus ([place]) 02/25/2016  . Chronic kidney disease, unspecified 09/05/2013  . Benign hypertension with CKD (chronic kidney disease) stage III ([place]) 09/05/2013  . CMC arthritis, thumb, degenerative 06/15/2013  . Onychomycosis due to dermatophyte 05/25/2013  . Acquired keratoderma 05/25/2013   Madden Piazza T Emely Fahy, OTR/L, CLT  Azalya Galyon 05/10/2020, 5:55 PM  [place] [facility] [address] [place], [address] Phone: [phone]   Fax:  [phone]  Name: Lisa Mills MRN: [id number] Date of Birth: [birth year]

## 2020-05-13 ENCOUNTER — Other Ambulatory Visit: Payer: Self-pay

## 2020-05-13 ENCOUNTER — Ambulatory Visit: Payer: PPO | Admitting: Occupational Therapy

## 2020-05-13 DIAGNOSIS — G5631 Lesion of radial nerve, right upper limb: Secondary | ICD-10-CM

## 2020-05-13 DIAGNOSIS — M25641 Stiffness of right hand, not elsewhere classified: Secondary | ICD-10-CM

## 2020-05-13 DIAGNOSIS — M6281 Muscle weakness (generalized): Secondary | ICD-10-CM

## 2020-05-13 DIAGNOSIS — M25621 Stiffness of right elbow, not elsewhere classified: Secondary | ICD-10-CM

## 2020-05-13 DIAGNOSIS — M25631 Stiffness of right wrist, not elsewhere classified: Secondary | ICD-10-CM

## 2020-05-13 NOTE — Therapy (Signed)
[place] [facility] [address], [place], [address] Phone: [phone]   Fax:  [phone]  Occupational Therapy Treatment  Patient Details  Name: Lisa Mills MRN: [id number] Date of Birth: 03/09/1929 Referring Provider (OT): Dr Marcelino Scot   Encounter Date: 05/13/2020   OT End of Session - 05/13/20 1622    Visit Number 7    Number of Visits 16    Date for OT Re-Evaluation 06/05/20    OT Start Time 1600    OT Stop Time 1650    OT Time Calculation (min) 50 min    Activity Tolerance Patient tolerated treatment well    Behavior During Therapy [facility] for tasks assessed/performed           Past Medical History:  Diagnosis Date  . Anemia   . Anxiety   . Chronic kidney disease    stage 3-4  . Constipation   . Depression   . Dermatophytosis of nail   . Diabetes mellitus without complication ([place])    Diet controlled, lost weight, no meds  . GERD (gastroesophageal reflux disease)   . Hemorrhoids   . History of shingles   . HLD (hyperlipidemia)   . Hypertension   . Hypothyroidism   . Keratoderma   . Restless legs syndrome (RLS)    hx    Past Surgical History:  Procedure Laterality Date  . BREAST BIOPSY Right 09/30/2017   Affirm bx-calcs ( X clip),benign  . BREAST EXCISIONAL BIOPSY Right    neg  . cataracts    . COLONOSCOPY W/ POLYPECTOMY    . COLONOSCOPY WITH PROPOFOL N/A 02/10/2017   Procedure: COLONOSCOPY WITH PROPOFOL;  Surgeon: Manya Silvas, MD;  Location: [facility] ENDOSCOPY;  Service: Endoscopy;  Laterality: N/A;  . ESOPHAGOGASTRODUODENOSCOPY (EGD) WITH PROPOFOL N/A 02/10/2017   Procedure: ESOPHAGOGASTRODUODENOSCOPY (EGD) WITH PROPOFOL;  Surgeon: Manya Silvas, MD;  Location[facility] ENDOSCOPY;  Service: Endoscopy;  Laterality: N/A;  . EYE SURGERY     bilateral cataracts  . HAND SURGERY    . KIDNEY STONE SURGERY    . ORIF HUMERUS FRACTURE Right 02/13/2020   Procedure: OPEN REDUCTION INTERNAL  FIXATION (ORIF) SUPRACONDYLAR  HUMERUS FRACTURE;  Surgeon: Altamese Meeker, MD;  Location: [place];  Service: Orthopedics;  Laterality: Right;  . SACROPLASTY N/A 07/06/2019   Procedure: SACROPLASTY;  Surgeon: Hessie Knows, MD;  Location: [facility] ORS;  Service: Orthopedics;  Laterality: N/A;  . TONSILECTOMY, ADENOIDECTOMY, BILATERAL MYRINGOTOMY AND TUBES    . TONSILLECTOMY    . TRIGGER FINGER RELEASE     Left ring finger  . tubercular peritonitis      There were no vitals filed for this visit.   Subjective Assessment - 05/13/20 1621    Subjective  I am so busy with my son stuff that past away -did not do as much exercises- but I do try and bend my elbow more and carry things    Pertinent History Pt fell and ended up with R supracondylar fx - ORIF done by Dr Marcelino Scot -pt also with radial N palsy - was suppose to have [facility] PT but never got it -and refer now by Dr Marcelino Scot to outpt OT on 04/03/20 - for ROM and possible radial N palsy custom splint    Patient Stated Goals Want to use my R hand to eat, do my hair, makeup, open doors, cook and make bed as well as drive again    Currently in Pain? No/denies  [facility] OT Assessment - 05/13/20 0001      AROM   Right Elbow Flexion 115    Right Elbow Extension -20           Pt able to cut food and carry plate - with wrist splint on - in R hand          OT Treatments/Exercises (OP) - 05/13/20 0001      RUE Paraffin   Number Minutes Paraffin 10 Minutes    RUE Paraffin Location --   elbow   Comments elbow flexion stretch , heatingpad - 3 min at time            Fitted with wrist prefab splint  in past and able to use during functional tasks at home - to provide stable wrist- pt able to cut food , feed self , pull and push door , turn doorknob and fold towel - with wrist splint on R hand  massage webspace -and then AROM for thumb PA 10 reps PROM for RA of thumb -10 reps   PROM and hold wrist extention stretch  10reps hold 5  sec Done PROM , place and hold and AROM with finish AROM with PROM for wrist ext - pt to choose any of those to do at home  table slides for wrist extention on table - on towel - 20 repslast time- check on - and review with pt  Flatten hand and digits on table 5 min  Take L hand and tap each digit 5 reps MC and PIP extention over edge of table 10 reps - AAROM to finish ROM   PROM for elbow ext and flexion- done 10 reps hold 1 min -after scar massage done to posterior elbow scar -with elbow in flexion  Done in supine Followed by elbow AROM reaching to ear and up to ceiling - shoulder at 90 flexion Upgrade to 2  lbs weight - with wrist brace on - elbow extention and flexion to ear - with shoulder 90 degrees flexion 12 reps 1 x 12 reps - 1 lbs reach for R ear and reach down to hip Add to HEP - pt has 2 lbs weight - borrowing      OT Education - 05/13/20 1622    Education Details progress and HEP    Person(s) Educated Patient;Child(ren)    Methods Explanation;Demonstration;Tactile cues;Verbal cues;Handout    Comprehension Verbal cues required;Returned demonstration;Verbalized understanding            OT Short Term Goals - 04/10/20 1801      OT SHORT TERM GOAL #1   Title Pt to be independent in HEP to decrease tightness in flexors of R forearm and hand to initiate AROM for wrist and digits extention    Baseline Pt did not had any therapy since surgery - HH could not come - very tight and stiff -    Time 3    Period Weeks    Status New    Target Date 05/01/20             OT Long Term Goals - 04/10/20 1807      OT LONG TERM GOAL #1   Title R elbow flexion and extention increase with more than 20 degrees to apply deodorant, wash hair and bath chest and neck    Baseline Flexion 105 R elbow, extention -40 -    Time 4    Period Weeks    Status New    Target Date  05/08/20      OT LONG TERM GOAL #2   Title R wrist extention improve for pt to maintain wrist neutral for to  bath herself and wipe counters    Baseline R wrist and digits flexors tight -and keep wrist in 60 degrees flexion - unable to open or extend MC's    Time 4    Period Weeks    Status New    Target Date 05/08/20      OT LONG TERM GOAL #3   Title R thumb AROM increase for pt to abd to grasp objects or utenciles of 1-2 inches    Baseline No AROM RA - PA 1/3 range without gravitiy - cannot grip any objects or release    Time 5    Period Weeks    Status New    Target Date 05/15/20      OT LONG TERM GOAL #4   Title R wrist AROM increase for pt to maintain wrist with grip to carry 1 lbs objects in ADL's    Baseline wrist drop - keep in 60 degrees flexion - cannot make fist - only when keep wrist with other hand at neutrral    Time 6    Period Weeks    Status New    Target Date 05/22/20      OT LONG TERM GOAL #5   Title Pt R elbow strength increase for pt to pick up or carry more than 4 lbs - wiht wrist splint if needed    Baseline no strength -just started therapy - radial nerve injury - wrist drop -and elbow AROM -40 and flexion 105    Time 8    Period Weeks    Status New    Target Date 06/05/20                 Plan - 05/13/20 1622    Clinical Impression Statement Pt is 13 wks s/p R supracondylar fx with ORIF and radial N palsy.  She is currently dealing with her son's recent death and trying to get his estate sorted out and completed.   Cancelling to 1 x wk for the next 2-3 wks.  She continues to progress with ROM/strengthening and exercises for elbow- wrist and digits about the same . Continues to demonstrate difficulty with active wrist extention and  finger movement for extension, ABD, ADD of fingers.  Continue to work towards goals in plan of care to improve ROM, strength and use of RUE In daily tasks. Pt able to do 2 lbs for elbow using wrist splint , cut food and hold plate    OT Occupational Profile and History Problem Focused Assessment - Including review of records  relating to presenting problem    Occupational performance deficits (Please refer to evaluation for details): ADL's;IADL's;Play;Rest and Sleep;Leisure;Social Participation    Body Structure / Function / Physical Skills ADL;Flexibility;ROM;UE functional use;FMC;Decreased knowledge of use of DME;Dexterity;Sensation;Strength;Pain;IADL    Rehab Potential Good    Clinical Decision Making Limited treatment options, no task modification necessary    Comorbidities Affecting Occupational Performance: None    Modification or Assistance to Complete Evaluation  No modification of tasks or assist necessary to complete eval    OT Frequency 1x / week    OT Duration 8 weeks    OT Treatment/Interventions Self-care/ADL training;Moist Heat;Paraffin;DME and/or AE instruction;Manual Therapy;Passive range of motion;Scar mobilization;Splinting;Patient/family education;Therapeutic exercise    Consulted and Agree with Plan of Care Patient  Patient will benefit from skilled therapeutic intervention in order to improve the following deficits and impairments:   Body Structure / Function / Physical Skills: ADL,Flexibility,ROM,UE functional use,FMC,Decreased knowledge of use of DME,Dexterity,Sensation,Strength,Pain,IADL       Visit Diagnosis: Compression of right radial nerve  Stiffness of right elbow, not elsewhere classified  Stiffness of right wrist, not elsewhere classified  Stiffness of right hand, not elsewhere classified  Muscle weakness (generalized)    Problem List Patient Active Problem List   Diagnosis Date Noted  . Right wrist drop 03/25/2020  . Psychophysiological insomnia 10/06/2019  . Nonintractable headache 10/06/2019  . Urinary incontinence 10/06/2019  . Internal and external bleeding hemorrhoids 08/15/2019  . Sacral pain   . Radicular pain of sacrum 07/11/2019  . Sacral fracture, closed ([place]) 07/04/2019  . Fall 07/04/2019  . Bilateral pubic rami fractures, sequela  07/04/2019  . Background diabetic retinopathy ([place]) 01/23/2019  . Schatzki's ring 03/04/2017  . Post herpetic neuralgia 10/06/2016  . Hx of colonic polyps 09/22/2016  . Osteopenia 09/02/2016  . Anxiety associated with depression 08/25/2016  . Trapezius muscle spasm 05/25/2016  . Intermittent left lower quadrant abdominal pain 03/16/2016  . Anemia in chronic kidney disease (CKD) 02/26/2016  . Type 2 diabetes mellitus with stage 4 chronic kidney disease ([place]) 02/25/2016  . Hypothyroidism 02/25/2016  . Constipation 02/25/2016  . GERD (gastroesophageal reflux disease) 02/25/2016  . Hyperlipidemia associated with type 2 diabetes mellitus ([place]) 02/25/2016  . DJD (degenerative joint disease) of cervical spine 02/25/2016  . Osteoarthritis of multiple joints 02/25/2016  . Hiatal hernia 02/25/2016  . CKD stage 3 due to type 2 diabetes mellitus ([place]) 02/25/2016  . Chronic kidney disease, unspecified 09/05/2013  . Benign hypertension with CKD (chronic kidney disease) stage III ([place]) 09/05/2013  . CMC arthritis, thumb, degenerative 06/15/2013  . Onychomycosis due to dermatophyte 05/25/2013  . Acquired keratoderma 05/25/2013    Rosalyn Gess OTR/L,CLT 05/13/2020, 5:00 PM  [place] [facility] [address], [place], [address] Phone: [phone]   Fax:  [phone]  Name: Lisa Mills MRN: [id number] Date of Birth: Jul 24, 1929

## 2020-05-15 ENCOUNTER — Encounter: Payer: PPO | Admitting: Occupational Therapy

## 2020-05-20 ENCOUNTER — Ambulatory Visit: Payer: PPO | Attending: Family Medicine | Admitting: Occupational Therapy

## 2020-05-20 ENCOUNTER — Other Ambulatory Visit: Payer: Self-pay

## 2020-05-20 ENCOUNTER — Encounter: Payer: PPO | Admitting: Occupational Therapy

## 2020-05-20 DIAGNOSIS — M25641 Stiffness of right hand, not elsewhere classified: Secondary | ICD-10-CM | POA: Diagnosis not present

## 2020-05-20 DIAGNOSIS — M25631 Stiffness of right wrist, not elsewhere classified: Secondary | ICD-10-CM | POA: Diagnosis not present

## 2020-05-20 DIAGNOSIS — G5631 Lesion of radial nerve, right upper limb: Secondary | ICD-10-CM | POA: Diagnosis not present

## 2020-05-20 DIAGNOSIS — M25621 Stiffness of right elbow, not elsewhere classified: Secondary | ICD-10-CM | POA: Insufficient documentation

## 2020-05-20 DIAGNOSIS — M6281 Muscle weakness (generalized): Secondary | ICD-10-CM | POA: Diagnosis not present

## 2020-05-20 NOTE — Therapy (Signed)
[facility] [address], [place], [address] Phone: [phone]   Fax:  [phone]  Occupational Therapy Treatment  Patient Details  Name: Lisa Mills MRN: [id number] Date of Birth: 11/29/1929 Referring Provider (OT): Dr Marcelino Scot   Encounter Date: 05/20/2020   OT End of Session - 05/20/20 1636    Visit Number 8    Number of Visits 16    Date for OT Re-Evaluation 06/05/20    OT Start Time 1603    OT Stop Time 1706    OT Time Calculation (min) 63 min    Activity Tolerance Patient tolerated treatment well    Behavior During Therapy [facility] for tasks assessed/performed           Past Medical History:  Diagnosis Date  . Anemia   . Anxiety   . Chronic kidney disease    stage 3-4  . Constipation   . Depression   . Dermatophytosis of nail   . Diabetes mellitus without complication ([place])    Diet controlled, lost weight, no meds  . GERD (gastroesophageal reflux disease)   . Hemorrhoids   . History of shingles   . HLD (hyperlipidemia)   . Hypertension   . Hypothyroidism   . Keratoderma   . Restless legs syndrome (RLS)    hx    Past Surgical History:  Procedure Laterality Date  . BREAST BIOPSY Right 09/30/2017   Affirm bx-calcs ( X clip),benign  . BREAST EXCISIONAL BIOPSY Right    neg  . cataracts    . COLONOSCOPY W/ POLYPECTOMY    . COLONOSCOPY WITH PROPOFOL N/A 02/10/2017   Procedure: COLONOSCOPY WITH PROPOFOL;  Surgeon: Manya Silvas, MD;  Location: [facility] ENDOSCOPY;  Service: Endoscopy;  Laterality: N/A;  . ESOPHAGOGASTRODUODENOSCOPY (EGD) WITH PROPOFOL N/A 02/10/2017   Procedure: ESOPHAGOGASTRODUODENOSCOPY (EGD) WITH PROPOFOL;  Surgeon: Manya Silvas, MD;  Location: [facility] ENDOSCOPY;  Service: Endoscopy;  Laterality: N/A;  . EYE SURGERY     bilateral cataracts  . HAND SURGERY    . KIDNEY STONE SURGERY    . ORIF HUMERUS FRACTURE Right 02/13/2020   Procedure: OPEN REDUCTION INTERNAL  FIXATION (ORIF) SUPRACONDYLAR  HUMERUS FRACTURE;  Surgeon: Altamese Stidham, MD;  Location: [facility];  Service: Orthopedics;  Laterality: Right;  . SACROPLASTY N/A 07/06/2019   Procedure: SACROPLASTY;  Surgeon: Hessie Knows, MD;  Location: [facility] ORS;  Service: Orthopedics;  Laterality: N/A;  . TONSILECTOMY, ADENOIDECTOMY, BILATERAL MYRINGOTOMY AND TUBES    . TONSILLECTOMY    . TRIGGER FINGER RELEASE     Left ring finger  . tubercular peritonitis      There were no vitals filed for this visit.   Subjective Assessment - 05/20/20 1635    Subjective  Still some issues with  taking care of some of the stuff of my son that past away - and then I have trouble with my bowels- it is just one thing after the other - do the other pt's that has elbow fracture also has this hand problem    Pertinent History Pt fell and ended up with R supracondylar fx - ORIF done by Dr Marcelino Scot -pt also with radial N palsy - was suppose to have [facility] PT but never got it -and refer now by Dr Marcelino Scot to outpt OT on 04/03/20 - for ROM and possible radial N palsy custom splint    Patient Stated Goals Want to use my R hand to eat, do my hair, makeup,  open doors, cook and make bed as well as drive again    Currently in Pain? No/denies                  Elbow flexion 125 this date      OT Treatments/Exercises (OP) - 05/20/20 0001      RUE Paraffin   Number Minutes Paraffin 10 Minutes    RUE Paraffin Location --   hand and elbow   Comments prior to soft itssue and elbow flexoin stretch             Fitted with wrist prefab splint in past and able to use during functional tasks at home - to provide stable wrist- pt able to cut food , feed self , pull and push door , turn doorknob and fold towel - with wrist splint on R hand  massage webspace- graston tool nr 2 brushing and sweeping volar hand and digits pror to PROM for digits extention and wrist extention  AROM for thumb PA 10 reps PROM for RA of thumb -10  reps   PROM and hold wrist extention stretch  10reps hold 5 sec Done PROM , place and hold and AROM with finish AROM with PROM for wrist ext without gravity - able to do partial AROM   table slides for wrist extention on table - on towel - 20 repsto cont at home with  Flatten hand and digits on table 5 min  Take L hand and tap each digit 5 reps Not holding digits it tight fist anymore- open more    PROM for elbow ext and flexion- 20 reps- 5-20 sec in supine  Followed by elbow AROM reaching to ear and up to ceiling - shoulder at 90 flexion  2  lbs weight - with wrist brace on - elbow extention and flexion to ear - with shoulder 90 degrees flexion 12 reps 3-4 sets of 5-7 reps - 2 lbs reach for R ear and reach down to hip- trouble with elbow flexion from hip to side -    Appear Radial N improving- mid forearm- pt more tender mid forearm now use to be proximal  Less tight fist - able to open hand more  And wrist ext partial AROM without gravity  After PROM and stretches - after paraffin         OT Education - 05/20/20 1636    Education Details progress and HEP    Person(s) Educated Patient;Child(ren)    Methods Explanation;Demonstration;Tactile cues;Verbal cues;Handout    Comprehension Verbal cues required;Returned demonstration;Verbalized understanding            OT Short Term Goals - 04/10/20 1801      OT SHORT TERM GOAL #1   Title Pt to be independent in HEP to decrease tightness in flexors of R forearm and hand to initiate AROM for wrist and digits extention    Baseline Pt did not had any therapy since surgery - HH could not come - very tight and stiff -    Time 3    Period Weeks    Status New    Target Date 05/01/20             OT Long Term Goals - 04/10/20 1807      OT LONG TERM GOAL #1   Title R elbow flexion and extention increase with more than 20 degrees to apply deodorant, wash hair and bath chest and neck    Baseline Flexion 105 R elbow,  extention -40 -    Time 4    Period Weeks    Status New    Target Date 05/08/20      OT LONG TERM GOAL #2   Title R wrist extention improve for pt to maintain wrist neutral for to bath herself and wipe counters    Baseline R wrist and digits flexors tight -and keep wrist in 60 degrees flexion - unable to open or extend MC's    Time 4    Period Weeks    Status New    Target Date 05/08/20      OT LONG TERM GOAL #3   Title R thumb AROM increase for pt to abd to grasp objects or utenciles of 1-2 inches    Baseline No AROM RA - PA 1/3 range without gravitiy - cannot grip any objects or release    Time 5    Period Weeks    Status New    Target Date 05/15/20      OT LONG TERM GOAL #4   Title R wrist AROM increase for pt to maintain wrist with grip to carry 1 lbs objects in ADL's    Baseline wrist drop - keep in 60 degrees flexion - cannot make fist - only when keep wrist with other hand at neutrral    Time 6    Period Weeks    Status New    Target Date 05/22/20      OT LONG TERM GOAL #5   Title Pt R elbow strength increase for pt to pick up or carry more than 4 lbs - wiht wrist splint if needed    Baseline no strength -just started therapy - radial nerve injury - wrist drop -and elbow AROM -40 and flexion 105    Time 8    Period Weeks    Status New    Target Date 06/05/20                 Plan - 05/20/20 1636    Clinical Impression Statement Pt is 14 wks s/p R supracondylar fx with ORIF and radial N palsy.  She is currently dealing with her son's recent death and trying to get his estate sorted out and completed.   Decrease few wks ago to 1 x wk.  She show 125 flexion of R elbow and ext -20 - making progress in flexion -and using 2 lbs for elbow- have trouble with elbow flexion against gravity- can do 6-7 reps - but do few sets- needed some min A - and appear to have wrist extention without gravity 2 /5 and less flexion of digits- keepin hand more open -  Continues to  demonstrate difficulty with active wrist extention and  finger movement for extension, ABD, ADD of fingers limiting her ADL's - but use wrist splint that helps.  Continue to work towards goals in plan of care to improve ROM, strength and use of RUE In daily tasks.    OT Occupational Profile and History Problem Focused Assessment - Including review of records relating to presenting problem    Occupational performance deficits (Please refer to evaluation for details): ADL's;IADL's;Play;Rest and Sleep;Leisure;Social Participation    Body Structure / Function / Physical Skills ADL;Flexibility;ROM;UE functional use;FMC;Decreased knowledge of use of DME;Dexterity;Sensation;Strength;Pain;IADL    Rehab Potential Good    Clinical Decision Making Limited treatment options, no task modification necessary    Comorbidities Affecting Occupational Performance: None    Modification or Assistance to Complete Evaluation  No modification of tasks or assist necessary to complete eval    OT Frequency 1x / week    OT Duration 8 weeks    OT Treatment/Interventions Self-care/ADL training;Moist Heat;Paraffin;DME and/or AE instruction;Manual Therapy;Passive range of motion;Scar mobilization;Splinting;Patient/family education;Therapeutic exercise    Consulted and Agree with Plan of Care Patient           Patient will benefit from skilled therapeutic intervention in order to improve the following deficits and impairments:   Body Structure / Function / Physical Skills: ADL,Flexibility,ROM,UE functional use,FMC,Decreased knowledge of use of DME,Dexterity,Sensation,Strength,Pain,IADL       Visit Diagnosis: Compression of right radial nerve  Stiffness of right elbow, not elsewhere classified  Stiffness of right wrist, not elsewhere classified  Stiffness of right hand, not elsewhere classified  Muscle weakness (generalized)    Problem List Patient Active Problem List   Diagnosis Date Noted  . Right wrist drop  03/25/2020  . Psychophysiological insomnia 10/06/2019  . Nonintractable headache 10/06/2019  . Urinary incontinence 10/06/2019  . Internal and external bleeding hemorrhoids 08/15/2019  . Sacral pain   . Radicular pain of sacrum 07/11/2019  . Sacral fracture, closed ([place]) 07/04/2019  . Fall 07/04/2019  . Bilateral pubic rami fractures, sequela 07/04/2019  . Background diabetic retinopathy ([place]) 01/23/2019  . Schatzki's ring 03/04/2017  . Post herpetic neuralgia 10/06/2016  . Hx of colonic polyps 09/22/2016  . Osteopenia 09/02/2016  . Anxiety associated with depression 08/25/2016  . Trapezius muscle spasm 05/25/2016  . Intermittent left lower quadrant abdominal pain 03/16/2016  . Anemia in chronic kidney disease (CKD) 02/26/2016  . Type 2 diabetes mellitus with stage 4 chronic kidney disease ([place]) 02/25/2016  . Hypothyroidism 02/25/2016  . Constipation 02/25/2016  . GERD (gastroesophageal reflux disease) 02/25/2016  . Hyperlipidemia associated with type 2 diabetes mellitus ([place]) 02/25/2016  . DJD (degenerative joint disease) of cervical spine 02/25/2016  . Osteoarthritis of multiple joints 02/25/2016  . Hiatal hernia 02/25/2016  . CKD stage 3 due to type 2 diabetes mellitus ([place]) 02/25/2016  . Chronic kidney disease, unspecified 09/05/2013  . Benign hypertension with CKD (chronic kidney disease) stage III ([place]) 09/05/2013  . CMC arthritis, thumb, degenerative 06/15/2013  . Onychomycosis due to dermatophyte 05/25/2013  . Acquired keratoderma 05/25/2013    Rosalyn Gess OTR/L,CLT 05/20/2020, 5:18 PM  [facility] [address], [place], [address] Phone: [phone]   Fax:  [phone]  Name: Lisa Mills MRN: [id number] Date of Birth: Sep 12, 1929

## 2020-05-21 ENCOUNTER — Other Ambulatory Visit: Payer: Self-pay | Admitting: Family Medicine

## 2020-05-21 DIAGNOSIS — E038 Other specified hypothyroidism: Secondary | ICD-10-CM

## 2020-05-22 ENCOUNTER — Ambulatory Visit (INDEPENDENT_AMBULATORY_CARE_PROVIDER_SITE_OTHER): Payer: PPO | Admitting: Licensed Clinical Social Worker

## 2020-05-22 DIAGNOSIS — N184 Chronic kidney disease, stage 4 (severe): Secondary | ICD-10-CM | POA: Diagnosis not present

## 2020-05-22 DIAGNOSIS — F418 Other specified anxiety disorders: Secondary | ICD-10-CM

## 2020-05-22 DIAGNOSIS — E1122 Type 2 diabetes mellitus with diabetic chronic kidney disease: Secondary | ICD-10-CM | POA: Diagnosis not present

## 2020-05-23 ENCOUNTER — Telehealth: Payer: Self-pay | Admitting: General Practice

## 2020-05-23 ENCOUNTER — Encounter: Payer: PPO | Admitting: Occupational Therapy

## 2020-05-23 ENCOUNTER — Telehealth: Payer: PPO

## 2020-05-23 NOTE — Telephone Encounter (Signed)
  Chronic Care Management   Outreach Note  05/23/2020 Name: Lisa Mills MRN: [id number] DOB: 1929-09-26  Referred by: Olin Hauser, DO Reason for referral : Appointment (RNCM: Follow up for Chronic Disease Management and Care Coordination Needs)   An unsuccessful telephone outreach was attempted today. The patient was referred to the case management team for assistance with care management and care coordination.   Follow Up Plan: A HIPAA compliant phone message was left for the patient providing contact information and requesting a return call.   Noreene Larsson RN, MSN, [place] Mobile: [phone]

## 2020-05-24 NOTE — Chronic Care Management (AMB) (Signed)
Chronic Care Management    Clinical Social Work Note  05/24/2020 Name: Lisa Mills MRN: [id number] DOB: Sep 27, 1929  Lisa Mills is a 87 y.o. year old female who is a primary care patient of Olin Hauser, DO. The CCM team was consulted to assist the patient with chronic disease management and/or care coordination needs related to: Level of Care Concerns and Mental Health Counseling and Resources.   Engaged with patient by telephone for follow up visit in response to provider referral for social work chronic care management and care coordination services.   Consent to Services:  The patient was given information about Chronic Care Management services, agreed to services, and gave verbal consent prior to initiation of services.  Please see initial visit note for detailed documentation.   Patient agreed to services and consent obtained.   Assessment: Patient is currently experiencing symptoms of  depression and anxiety which seems to be exacerbated by grief and a longer recovery period for right arm/wrist, than pt anticipated.Pt is grieving the recent loss of son and is frustrated with limited mobility due to a previous fall. She is receiving support from family and is not interested in additional grief support resources at this time. See Care Plan below for interventions and patient self-care actives. Recent life changes [place] Journey: Grief of adult son Recommendation: Patient may benefit from, and is in agreement to continue utilizing healthy coping skills.  Follow up Plan: Patient would like continued follow-up.  CCM LCSW will follow up with patient on 07/24/20. Patient will call office if needed prior to next encounter.  SDOH (Social Determinants of Health) assessments and interventions performed:    Advanced Directives Status: Not addressed in this encounter.  CCM Care Plan  Allergies  Allergen Reactions  . Aspirin Other (See Comments)    Burns stomach  . Conray  [Iothalamate] Hives    IV dye conray-400  . Dye Fdc Red [Red Dye] Hives  . Sulfa Antibiotics     Unknown Reaction, not used in years  . Prednisone     Indigestion     Outpatient Encounter Medications as of 05/22/2020  Medication Sig  . acetaminophen (TYLENOL) 500 MG tablet Take 1 tablet (500 mg total) by mouth every 12 (twelve) hours.  . Blood Glucose Monitoring Suppl ([place]) w/Device KIT Use glucometer to check blood sugar daily. (Patient not taking: No sig reported)  . Cholecalciferol (VITAMIN D) 50 MCG (2000 UT) CAPS Take 2,000 Units by mouth daily.   Marland Kitchen escitalopram (LEXAPRO) 5 MG tablet TAKE 1 TABLET BY MOUTH ONCE DAILY WITH FOOD  . ferrous sulfate 325 (65 FE) MG tablet Take 325 mg by mouth daily.   Marland Kitchen glucose blood (ONE TOUCH ULTRA TEST) test strip CHECK BLOOD SUGAR UP TO 2 TIMES A DAY.  Marland Kitchen glycerin adult 2 g suppository Place 1 suppository rectally as needed for constipation.  Marland Kitchen Histamine Dihydrochloride (AUSTRALIAN DREAM ARTHRITIS) 0.025 % CREA Apply 1 application topically daily as needed (Pain).  . hydrocortisone (ANUSOL-HC) 2.5 % rectal cream USE 1 APPLICATION RECTALLY TWICE DAILY AS DIRECTED (Patient taking differently: Place 1 application rectally daily as needed for hemorrhoids or anal itching.)  . levothyroxine (SYNTHROID) 25 MCG tablet TAKE 1 TABLET BY MOUTH ONCE DAILY ON AN EMPTY STOMACH. WAIT 30 MINUTES BEFORE TAKING OTHER MEDS.  Marland Kitchen lisinopril (ZESTRIL) 5 MG tablet Take 5 mg by mouth daily.  . nitrofurantoin (MACRODANTIN) 100 MG capsule Take 1 capsule (100 mg total) by mouth daily.  Marland Kitchen  omeprazole (PRILOSEC) 20 MG capsule Take 20 mg by mouth daily before breakfast.  . OneTouch Delica Lancets 49P MISC 1 each by Does not apply route 3 (three) times daily. Dx:E11.9  . polyethylene glycol (MIRALAX / GLYCOLAX) 17 g packet Take 17 g by mouth daily as needed for moderate constipation or severe constipation. (Patient taking differently: Take 17 g by mouth daily.)  .  simvastatin (ZOCOR) 40 MG tablet Take 1 tablet (40 mg total) by mouth at bedtime.  . traZODone (DESYREL) 50 MG tablet Take 0.5-1 tablets (25-50 mg total) by mouth at bedtime as needed for sleep. (Patient taking differently: Take 25 mg by mouth at bedtime as needed for sleep.)  . witch hazel-glycerin (TUCKS) pad Apply 1 application topically as needed for itching.   No facility-administered encounter medications on file as of 05/22/2020.    Patient Active Problem List   Diagnosis Date Noted  . Right wrist drop 03/25/2020  . Psychophysiological insomnia 10/06/2019  . Nonintractable headache 10/06/2019  . Urinary incontinence 10/06/2019  . Internal and external bleeding hemorrhoids 08/15/2019  . Sacral pain   . Radicular pain of sacrum 07/11/2019  . Sacral fracture, closed ([place]) 07/04/2019  . Fall 07/04/2019  . Bilateral pubic rami fractures, sequela 07/04/2019  . Background diabetic retinopathy ([place]) 01/23/2019  . Schatzki's ring 03/04/2017  . Post herpetic neuralgia 10/06/2016  . Hx of colonic polyps 09/22/2016  . Osteopenia 09/02/2016  . Anxiety associated with depression 08/25/2016  . Trapezius muscle spasm 05/25/2016  . Intermittent left lower quadrant abdominal pain 03/16/2016  . Anemia in chronic kidney disease (CKD) 02/26/2016  . Type 2 diabetes mellitus with stage 4 chronic kidney disease ([place]) 02/25/2016  . Hypothyroidism 02/25/2016  . Constipation 02/25/2016  . GERD (gastroesophageal reflux disease) 02/25/2016  . Hyperlipidemia associated with type 2 diabetes mellitus ([place]) 02/25/2016  . DJD (degenerative joint disease) of cervical spine 02/25/2016  . Osteoarthritis of multiple joints 02/25/2016  . Hiatal hernia 02/25/2016  . CKD stage 3 due to type 2 diabetes mellitus ([place]) 02/25/2016  . Chronic kidney disease, unspecified 09/05/2013  . Benign hypertension with CKD (chronic kidney disease) stage III ([place]) 09/05/2013  . CMC arthritis, thumb, degenerative 06/15/2013  .  Onychomycosis due to dermatophyte 05/25/2013  . Acquired keratoderma 05/25/2013    Conditions to be addressed/monitored: Anxiety, Depression and Grief; Level of care concerns and Mental Health Concerns   Care Plan : General Social Work (Adult)  Updates made by Rebekah Chesterfield, LCSW since 05/24/2020 12:00 AM    Problem: Coping Skills (General Plan of Care)     Long-Range Goal: Coping Skills Enhanced   Start Date: 02/01/2020  This Visit's Progress: On track  Priority: Medium  Note:   Timeframe:  Long-Range Goal Priority:  Medium Start Date:  04/10/20                  Expected End Date: 09/18/20                  Follow Up Date - 07/24/20    Current barriers:  Patient unable to consistently perform activities of daily living and needs additional assistance and support in order to meet this unmet need . Limited social support, ADL IADL limitations, Mental Health Concerns , Social Isolation, and Limited access to caregiver . Lacks social connections  Clinical Goals: Over the next 120 days, patient will gain additional education on in home support resources within her area  Over the next 120 days, patient will  work with SW to address concerns related to anxiety management  Interventions : . Assessed needs, level of care concerns, and barriers to care . Patient reports that she has been managing daily care needs well. She denies any recent falls. She is scheduled to attend physical therapy once a week and has an upcoming appt on 05/2320 with surgeon to follow up on drop wrist . Patient endorses frustration due to recovery process being approx three months with limited progress in the mobility of right hand/elbow. She also reports overwhelming feeling of grief due to son, Shanon Brow, passed in April 2022 due to chronic medical conditions. She reports "taking it one day at a time" The chaplin through Hospice calls periodically to follow up with family. Patient is not interested in grief support  resources at this time . CCM LCSW used empathetic and active and reflective listening, validated patient's feelings/concerns, and provided emotional support. Strategies to assist in coping with emotional regulation was discussed . Patient continues to drive independently to local medical or hair appointments. Son assists with transportation needs that are out of [place] or [place] . Patient plans on scheduling a follow up appt with PCP in June . 1:1 collaboration with PCP regarding development and update of comprehensive plan of care as evidenced by provider attestation and co-signature  . Discussed plans with patient for ongoing care management follow up and provided patient with direct contact information for care management team Inter-disciplinary care team collaboration (see longitudinal plan of care) . Collaborated with appropriate clinical care team members regarding patient needs  Patient Goals for the next 120 days: . Continue adherence to treatment plan . Schedule follow up appointment with PCP . Continue utilizing support system to assist with management of symptoms       Christa See, MSW, [email or web address] Phone [phone] 1:45 PM

## 2020-05-24 NOTE — Patient Instructions (Signed)
Visit Information  Goals Addressed            This Visit's Progress   . COMPLETED: SW-Find Help in My Community       Follow Up Date 90 days from 12/05/19   - begin a notebook of services in my neighborhood or community - call 211 when I need some help - follow-up on any referrals for help I am given - think ahead to make sure my need does not become an emergency - make a note about what I need to have by the phone or take with me, like an identification card or social security number have a back-up plan - have a back-up plan - make a list of family or friends that I can call -consider hiring in home support since having to discontinue services with last aide    Why is this important?   Knowing how and where to find help for yourself or family in your neighborhood and community is an important skill.  You will want to take some steps to learn how.    Notes: Son comes daily for several hours to check on patient.    Marland Kitchen SW-Manage My Emotions   On track    Timeframe:  Long-Range Goal Priority:  Medium Start Date:  02/01/20                         Expected End Date:  08/31//22                    Follow Up Date - 07/24/20   Patient Goals for the next 120 days: . Continue adherence to treatment plan . Schedule follow up appointment with PCP . Continue utilizing support system to assist with management of symptoms    . SW-Matintain My Quality of Life   On track    Timeframe:  Long-Range Goal Priority:  Medium Start Date:  04/10/20                  Expected End Date: 09/18/20                  Follow Up Date - 07/24/20   Patient Goals for the next 120 days: . Continue adherence to treatment plan . Schedule follow up appointment with PCP . Continue utilizing support system to assist with management of symptoms       Patient verbalizes understanding of instructions provided today.   Telephone follow up appointment with care management team member scheduled  for:07/24/20  Christa See, MSW, [email or web address] Phone [phone] 1:46 PM

## 2020-05-27 ENCOUNTER — Other Ambulatory Visit: Payer: Self-pay

## 2020-05-27 ENCOUNTER — Encounter: Payer: PPO | Admitting: Occupational Therapy

## 2020-05-27 ENCOUNTER — Ambulatory Visit: Payer: PPO | Admitting: Occupational Therapy

## 2020-05-27 DIAGNOSIS — M25641 Stiffness of right hand, not elsewhere classified: Secondary | ICD-10-CM

## 2020-05-27 DIAGNOSIS — M6281 Muscle weakness (generalized): Secondary | ICD-10-CM

## 2020-05-27 DIAGNOSIS — G5631 Lesion of radial nerve, right upper limb: Secondary | ICD-10-CM | POA: Diagnosis not present

## 2020-05-27 DIAGNOSIS — M25621 Stiffness of right elbow, not elsewhere classified: Secondary | ICD-10-CM

## 2020-05-27 DIAGNOSIS — M25631 Stiffness of right wrist, not elsewhere classified: Secondary | ICD-10-CM

## 2020-05-27 NOTE — Therapy (Signed)
[facility] [address] [place], [address] Phone: [phone]   Fax:  [phone]  Occupational Therapy Treatment  Patient Details  Name: Lisa Mills MRN: [id number] Date of Birth: 1929/08/09 Referring Provider (OT): Dr Marcelino Scot   Encounter Date: 05/27/2020   OT End of Session - 05/27/20 1614    Visit Number 9    Number of Visits 16    Date for OT Re-Evaluation 06/05/20    OT Start Time 1603    OT Stop Time 1646    OT Time Calculation (min) 43 min    Activity Tolerance Patient tolerated treatment well    Behavior During Therapy [facility] for tasks assessed/performed           Past Medical History:  Diagnosis Date  . Anemia   . Anxiety   . Chronic kidney disease    stage 3-4  . Constipation   . Depression   . Dermatophytosis of nail   . Diabetes mellitus without complication ([place])    Diet controlled, lost weight, no meds  . GERD (gastroesophageal reflux disease)   . Hemorrhoids   . History of shingles   . HLD (hyperlipidemia)   . Hypertension   . Hypothyroidism   . Keratoderma   . Restless legs syndrome (RLS)    hx    Past Surgical History:  Procedure Laterality Date  . BREAST BIOPSY Right 09/30/2017   Affirm bx-calcs ( X clip),benign  . BREAST EXCISIONAL BIOPSY Right    neg  . cataracts    . COLONOSCOPY W/ POLYPECTOMY    . COLONOSCOPY WITH PROPOFOL N/A 02/10/2017   Procedure: COLONOSCOPY WITH PROPOFOL;  Surgeon: Manya Silvas, MD;  Location: [facility] ENDOSCOPY;  Service: Endoscopy;  Laterality: N/A;  . ESOPHAGOGASTRODUODENOSCOPY (EGD) WITH PROPOFOL N/A 02/10/2017   Procedure: ESOPHAGOGASTRODUODENOSCOPY (EGD) WITH PROPOFOL;  Surgeon: Manya Silvas, MD;  Location: [facility] ENDOSCOPY;  Service: Endoscopy;  Laterality: N/A;  . EYE SURGERY     bilateral cataracts  . HAND SURGERY    . KIDNEY STONE SURGERY    . ORIF HUMERUS FRACTURE Right 02/13/2020   Procedure: OPEN REDUCTION INTERNAL  FIXATION (ORIF) SUPRACONDYLAR  HUMERUS FRACTURE;  Surgeon: Altamese Pierson, MD;  Location: [place];  Service: Orthopedics;  Laterality: Right;  . SACROPLASTY N/A 07/06/2019   Procedure: SACROPLASTY;  Surgeon: Hessie Knows, MD;  Location: [facility] ORS;  Service: Orthopedics;  Laterality: N/A;  . TONSILECTOMY, ADENOIDECTOMY, BILATERAL MYRINGOTOMY AND TUBES    . TONSILLECTOMY    . TRIGGER FINGER RELEASE     Left ring finger  . tubercular peritonitis      There were no vitals filed for this visit.   Subjective Assessment - 05/27/20 1612    Subjective  My wrist still not working and the thumb- and I am trying to bend the elbow in -touching my chest and ear    Pertinent History Pt fell and ended up with R supracondylar fx - ORIF done by Dr Marcelino Scot -pt also with radial N palsy - was suppose to have [facility] PT but never got it -and refer now by Dr Marcelino Scot to outpt OT on 04/03/20 - for ROM and possible radial N palsy custom splint    Patient Stated Goals Want to use my R hand to eat, do my hair, makeup, open doors, cook and make bed as well as drive again              [facility] OT Assessment -  05/27/20 0001      AROM   Right Elbow Flexion 120    Right Elbow Extension -20                    OT Treatments/Exercises (OP) - 05/27/20 0001      RUE Paraffin   Number Minutes Paraffin 8 Minutes    RUE Paraffin Location --   elbow   Comments 2 min flexion - 3 stretches         Done this date with elbow on table - pt did 3 x 2 min stretch - on forearm - hand to ear- during parafin Then PROM and stretches 20 reps by OT   massage webspace-PROM to PA and RA and flexion of thumb   AROM for thumb PA 10 reps PROM for RA of thumb -10 reps  PROM for elbow ext and flexion- 20 reps- with elbow on table and hand to ear   2lbs weight - with wrist brace on - elbow to 90 and flexion to ear - 12 reps x 2 - elbow on table  Pt to do at home      PROM and hold wrist extention stretch  10reps hold 5  sec Done PROM , place and hold and AROM with finish AROM with PROM for wrist ext without gravity - able to do partial AROM   Review again with pt table slides for wrist extention on table - on towel - 20 reps- weight bearing thru palm - composite extention stretch      Appear Radial N improving- mid forearm- pt more tender mid forearm now use to be proximal  Less tight fist - able to open hand more  And wrist ext partial AROM without gravity  After PROM and stretches - after paraffin          OT Education - 05/27/20 1614    Education Details progress and HEP    Person(s) Educated Patient;Child(ren)    Methods Explanation;Demonstration;Tactile cues;Verbal cues;Handout    Comprehension Verbal cues required;Returned demonstration;Verbalized understanding            OT Short Term Goals - 04/10/20 1801      OT SHORT TERM GOAL #1   Title Pt to be independent in HEP to decrease tightness in flexors of R forearm and hand to initiate AROM for wrist and digits extention    Baseline Pt did not had any therapy since surgery - HH could not come - very tight and stiff -    Time 3    Period Weeks    Status New    Target Date 05/01/20             OT Long Term Goals - 04/10/20 1807      OT LONG TERM GOAL #1   Title R elbow flexion and extention increase with more than 20 degrees to apply deodorant, wash hair and bath chest and neck    Baseline Flexion 105 R elbow, extention -40 -    Time 4    Period Weeks    Status New    Target Date 05/08/20      OT LONG TERM GOAL #2   Title R wrist extention improve for pt to maintain wrist neutral for to bath herself and wipe counters    Baseline R wrist and digits flexors tight -and keep wrist in 60 degrees flexion - unable to open or extend MC's    Time 4  Period Weeks    Status New    Target Date 05/08/20      OT LONG TERM GOAL #3   Title R thumb AROM increase for pt to abd to grasp objects or utenciles of 1-2 inches     Baseline No AROM RA - PA 1/3 range without gravitiy - cannot grip any objects or release    Time 5    Period Weeks    Status New    Target Date 05/15/20      OT LONG TERM GOAL #4   Title R wrist AROM increase for pt to maintain wrist with grip to carry 1 lbs objects in ADL's    Baseline wrist drop - keep in 60 degrees flexion - cannot make fist - only when keep wrist with other hand at neutrral    Time 6    Period Weeks    Status New    Target Date 05/22/20      OT LONG TERM GOAL #5   Title Pt R elbow strength increase for pt to pick up or carry more than 4 lbs - wiht wrist splint if needed    Baseline no strength -just started therapy - radial nerve injury - wrist drop -and elbow AROM -40 and flexion 105    Time 8    Period Weeks    Status New    Target Date 06/05/20                 Plan - 05/27/20 1615    Clinical Impression Statement Pt is 15 wks s/p R supracondylar fx with ORIF and radial N palsy.  She is currently dealing with her son's recent death and trying to get his estate sorted out and completed.   Decrease few wks ago to 1 x wk.  She show 12 flexion of R elbow and ext -20 coming in - making progress in flexion -a in session had PROM of 135 and AROM 130 - review again with pt PROM and stretch for elbow flexion - and using  2 lbs for elbow with elbow on table and bring weight to ear and with wrist splint on - and appear to have wrist extention without gravity 2 /5 and less flexion of digits- keepin hand more open -  REview again with pt to do weight bearing and slides on table for composite wrist and digits extention - tolerated well - Continues to demonstrate difficulty with active wrist extention and  finger movement for extension, ABD, ADD of fingers limiting her ADL's - but use wrist splint that helps.  Continue to work towards goals in plan of care to improve ROM, strength and use of RUE In daily tasks.    OT Occupational Profile and History Problem Focused Assessment  - Including review of records relating to presenting problem    Occupational performance deficits (Please refer to evaluation for details): ADL's;IADL's;Play;Rest and Sleep;Leisure;Social Participation    Body Structure / Function / Physical Skills ADL;Flexibility;ROM;UE functional use;FMC;Decreased knowledge of use of DME;Dexterity;Sensation;Strength;Pain;IADL    Rehab Potential Good    Clinical Decision Making Limited treatment options, no task modification necessary    Comorbidities Affecting Occupational Performance: None    Modification or Assistance to Complete Evaluation  No modification of tasks or assist necessary to complete eval    OT Frequency 1x / week    OT Duration 8 weeks    OT Treatment/Interventions Self-care/ADL training;Moist Heat;Paraffin;DME and/or AE instruction;Manual Therapy;Passive range of motion;Scar mobilization;Splinting;Patient/family education;Therapeutic exercise  Consulted and Agree with Plan of Care Patient           Patient will benefit from skilled therapeutic intervention in order to improve the following deficits and impairments:   Body Structure / Function / Physical Skills: ADL,Flexibility,ROM,UE functional use,FMC,Decreased knowledge of use of DME,Dexterity,Sensation,Strength,Pain,IADL       Visit Diagnosis: Compression of right radial nerve  Stiffness of right elbow, not elsewhere classified  Stiffness of right wrist, not elsewhere classified  Stiffness of right hand, not elsewhere classified  Muscle weakness (generalized)    Problem List Patient Active Problem List   Diagnosis Date Noted  . Right wrist drop 03/25/2020  . Psychophysiological insomnia 10/06/2019  . Nonintractable headache 10/06/2019  . Urinary incontinence 10/06/2019  . Internal and external bleeding hemorrhoids 08/15/2019  . Sacral pain   . Radicular pain of sacrum 07/11/2019  . Sacral fracture, closed ([place]) 07/04/2019  . Fall 07/04/2019  . Bilateral pubic  rami fractures, sequela 07/04/2019  . Background diabetic retinopathy ([place]) 01/23/2019  . Schatzki's ring 03/04/2017  . Post herpetic neuralgia 10/06/2016  . Hx of colonic polyps 09/22/2016  . Osteopenia 09/02/2016  . Anxiety associated with depression 08/25/2016  . Trapezius muscle spasm 05/25/2016  . Intermittent left lower quadrant abdominal pain 03/16/2016  . Anemia in chronic kidney disease (CKD) 02/26/2016  . Type 2 diabetes mellitus with stage 4 chronic kidney disease ([place]) 02/25/2016  . Hypothyroidism 02/25/2016  . Constipation 02/25/2016  . GERD (gastroesophageal reflux disease) 02/25/2016  . Hyperlipidemia associated with type 2 diabetes mellitus ([place]) 02/25/2016  . DJD (degenerative joint disease) of cervical spine 02/25/2016  . Osteoarthritis of multiple joints 02/25/2016  . Hiatal hernia 02/25/2016  . CKD stage 3 due to type 2 diabetes mellitus ([place]) 02/25/2016  . Chronic kidney disease, unspecified 09/05/2013  . Benign hypertension with CKD (chronic kidney disease) stage III ([place]) 09/05/2013  . CMC arthritis, thumb, degenerative 06/15/2013  . Onychomycosis due to dermatophyte 05/25/2013  . Acquired keratoderma 05/25/2013    Rosalyn Gess OTR/l,CLT 05/27/2020, 5:02 PM  [facility] [address] [place], [address] Phone: [phone]   Fax:  [phone]  Name: Lisa Mills MRN: [id number] Date of Birth: 1929-04-27

## 2020-05-28 NOTE — Telephone Encounter (Signed)
Patient has been rescheduled states that she wanted to push out until July patient stated that she was doing well at this time. Also she did not mention anything about her son.

## 2020-05-29 ENCOUNTER — Other Ambulatory Visit: Payer: Self-pay | Admitting: Family Medicine

## 2020-05-29 DIAGNOSIS — F418 Other specified anxiety disorders: Secondary | ICD-10-CM

## 2020-05-29 DIAGNOSIS — E1122 Type 2 diabetes mellitus with diabetic chronic kidney disease: Secondary | ICD-10-CM

## 2020-05-29 NOTE — Telephone Encounter (Signed)
Notes to clinic: medication filled by a different provider  Review for continued use and refill    Requested Prescriptions  Pending Prescriptions Disp Refills   lisinopril (ZESTRIL) 5 MG tablet [Pharmacy Med Name: LISINOPRIL 5 MG TAB] 90 tablet     Sig: TAKE 1 TABLET BY MOUTH ONCE DAILY      Cardiovascular:  ACE Inhibitors Failed - 05/29/2020  9:14 AM      Failed - Last BP in normal range    BP Readings from Last 1 Encounters:  03/25/20 (!) 174/73          Passed - Cr in normal range and within 180 days    Creat  Date Value Ref Range Status  09/06/2019 0.94 (H) 0.60 - 0.88 mg/dL Final    Comment:    For patients >45 years of age, the reference limit for Creatinine is approximately 13% higher for people identified as African-American. .    Creatinine, Ser  Date Value Ref Range Status  02/13/2020 0.93 0.44 - 1.00 mg/dL Final          Passed - K in normal range and within 180 days    Potassium  Date Value Ref Range Status  02/13/2020 4.0 3.5 - 5.1 mmol/L Final  06/28/2012 4.2 3.5 - 5.1 mmol/L Final          Passed - Patient is not pregnant      Passed - Valid encounter within last 6 months    Recent Outpatient Visits           2 months ago Type 2 diabetes mellitus with stage 4 chronic kidney disease, without long-term current use of insulin ([place])   [place], DO   4 months ago Recurrent falls   [place], DO   7 months ago Psychophysiological insomnia   [place], DO   7 months ago Psychophysiological insomnia   [facility], Lupita Raider, FNP   8 months ago Acute cystitis without hematuria   [place], Devonne Doughty, DO       Future Appointments             In 6 months MacDiarmid, Nicki Reaper, MD [place]   In 6 months  [facility], [place]               Signed Prescriptions Disp Refills   escitalopram (LEXAPRO) 5 MG tablet 90 tablet 0    Sig: TAKE 1 TABLET BY MOUTH ONCE DAILY WITH FOOD      Psychiatry:  Antidepressants - SSRI Passed - 05/29/2020  9:14 AM      Passed - Completed PHQ-2 or PHQ-9 in the last 360 days      Passed - Valid encounter within last 6 months    Recent Outpatient Visits           2 months ago Type 2 diabetes mellitus with stage 4 chronic kidney disease, without long-term current use of insulin ([place])   [place], DO   4 months ago Recurrent falls   [place], DO   7 months ago Psychophysiological insomnia   [place], DO   7 months ago Psychophysiological insomnia   [place], Lupita Raider, FNP   8 months ago Acute cystitis without  hematuria   [place], DO       Future Appointments             In 6 months MacDiarmid, Nicki Reaper, MD [place]   In 6 months  [facility], [facility]

## 2020-05-30 ENCOUNTER — Encounter: Payer: PPO | Admitting: Occupational Therapy

## 2020-06-05 ENCOUNTER — Other Ambulatory Visit: Payer: Self-pay

## 2020-06-05 ENCOUNTER — Ambulatory Visit: Payer: PPO | Admitting: Occupational Therapy

## 2020-06-05 DIAGNOSIS — M25621 Stiffness of right elbow, not elsewhere classified: Secondary | ICD-10-CM

## 2020-06-05 DIAGNOSIS — M6281 Muscle weakness (generalized): Secondary | ICD-10-CM

## 2020-06-05 DIAGNOSIS — G5631 Lesion of radial nerve, right upper limb: Secondary | ICD-10-CM

## 2020-06-05 DIAGNOSIS — M25631 Stiffness of right wrist, not elsewhere classified: Secondary | ICD-10-CM

## 2020-06-05 DIAGNOSIS — M25641 Stiffness of right hand, not elsewhere classified: Secondary | ICD-10-CM

## 2020-06-05 NOTE — Therapy (Signed)
[place] [facility] [address] [place], [address] Phone: [phone]   Fax:  [phone]  Occupational Therapy Treatment  Patient Details  Name: Lisa Mills MRN: [id number] Date of Birth: [birth year] Referring Provider (OT): Dr Marcelino Scot   Encounter Date: 06/05/2020   OT End of Session - 06/05/20 1713    Visit Number 10    Number of Visits 16    Date for OT Re-Evaluation 06/05/20    OT Start Time 1601    OT Stop Time 1659    OT Time Calculation (min) 58 min    Activity Tolerance Patient tolerated treatment well    Behavior During Therapy [facility] for tasks assessed/performed           Past Medical History:  Diagnosis Date  . Anemia   . Anxiety   . Chronic kidney disease    stage 3-4  . Constipation   . Depression   . Dermatophytosis of nail   . Diabetes mellitus without complication ([place])    Diet controlled, lost weight, no meds  . GERD (gastroesophageal reflux disease)   . Hemorrhoids   . History of shingles   . HLD (hyperlipidemia)   . Hypertension   . Hypothyroidism   . Keratoderma   . Restless legs syndrome (RLS)    hx    Past Surgical History:  Procedure Laterality Date  . BREAST BIOPSY Right 09/30/2017   Affirm bx-calcs ( X clip),benign  . BREAST EXCISIONAL BIOPSY Right    neg  . cataracts    . COLONOSCOPY W/ POLYPECTOMY    . COLONOSCOPY WITH PROPOFOL N/A 02/10/2017   Procedure: COLONOSCOPY WITH PROPOFOL;  Surgeon: Manya Silvas, MD;  Location: [facility] ENDOSCOPY;  Service: Endoscopy;  Laterality: N/A;  . ESOPHAGOGASTRODUODENOSCOPY (EGD) WITH PROPOFOL N/A 02/10/2017   Procedure: ESOPHAGOGASTRODUODENOSCOPY (EGD) WITH PROPOFOL;  Surgeon: Manya Silvas, MD;  Location: [facility] ENDOSCOPY;  Service: Endoscopy;  Laterality: N/A;  . EYE SURGERY     bilateral cataracts  . HAND SURGERY    . KIDNEY STONE SURGERY    . ORIF HUMERUS FRACTURE Right 02/13/2020   Procedure: OPEN REDUCTION INTERNAL  FIXATION (ORIF) SUPRACONDYLAR  HUMERUS FRACTURE;  Surgeon: Altamese Epps, MD;  Location: [facility];  Service: Orthopedics;  Laterality: Right;  . SACROPLASTY N/A 07/06/2019   Procedure: SACROPLASTY;  Surgeon: Hessie Knows, MD;  Location: [facility] ORS;  Service: Orthopedics;  Laterality: N/A;  . TONSILECTOMY, ADENOIDECTOMY, BILATERAL MYRINGOTOMY AND TUBES    . TONSILLECTOMY    . TRIGGER FINGER RELEASE     Left ring finger  . tubercular peritonitis      There were no vitals filed for this visit.   Subjective Assessment - 06/05/20 1711    Subjective  I tried to do the exerises - that 2 lbs weight is heavy -and then I have bowel problems- was suppose to have therapy last year for that - but I think I am starting to get some wrist motion    Pertinent History Pt fell and ended up with R supracondylar fx - ORIF done by Dr Marcelino Scot -pt also with radial N palsy - was suppose to have [facility] PT but never got it -and refer now by Dr Marcelino Scot to outpt OT on 04/03/20 - for ROM and possible radial N palsy custom splint    Currently in Pain? No/denies             ELBOW flexion 120 for few weeks now extention -  20 to -25 but functional            OT Treatments/Exercises (OP) - 06/05/20 0001      RUE Paraffin   Number Minutes Paraffin 8 Minutes    RUE Paraffin Location Hand   elbow   Comments prior to stretches for elbow flexion             Elbow and forearm on table - pt to use lightly her body weight bending fw and stretch - 5 x 30 sec to 1 min  Interlock hands and slide down back of heat - can keep wrist splint on - for 5 x hold 50 sec  3 x day  2lbs weight - with wrist brace on - elbow to 90 and flexion to ear - 12 reps x 2 - elbow on table  To do at home - verbalize understanding    massage webspace-PROM to PA and RA and flexion of thumb  AROM for thumb PA 10 reps PROM for RA of thumb -10 reps     PROM and hold wrist extention stretch  10reps hold 5 sec  table slides for wrist  extention on table - on towel - 20 reps- weight bearing thru palm - composite extention stretch  prior to  Wrist extention AROM and  place and hold wrist ext- 8 reps several times during day        Appear Radial N improving-was  mid forearm but now more distal  wrist and initiation of wrist extention this week -  Less tight fist - able to open hand more           OT Education - 06/05/20 1713    Education Details progress and HEP    Person(s) Educated Patient;Child(ren)    Methods Explanation;Demonstration;Tactile cues;Verbal cues;Handout    Comprehension Verbal cues required;Returned demonstration;Verbalized understanding            OT Short Term Goals - 06/05/20 1722      OT SHORT TERM GOAL #1   Title Pt to be independent in HEP to decrease tightness in flexors of R forearm and hand to initiate AROM for wrist and digits extention    Status Achieved             OT Long Term Goals - 06/05/20 1722      OT LONG TERM GOAL #1   Title R elbow flexion and extention increase with more than 20 degrees to apply deodorant, wash hair and bath chest and neck    Baseline Flexion 105 R elbow, extention -40 -   NOW elbow flexion 120 , extention -20 to -25  wrist extention limiting her functional use    Time 4    Period Weeks    Status On-going    Target Date 07/03/20      OT LONG TERM GOAL #2   Title R wrist extention improve for pt to maintain wrist neutral for to bath herself and wipe counters    Baseline R wrist and digits flexors tight -and keep wrist in 60 degrees flexion - unable to open or extend MC's and wrist- this date able to extend wrist from flexion 20 degrees , MC extention -50 to -80 degrees, PIP extention WNL    Time 12    Period Weeks    Status On-going    Target Date 08/28/20      OT LONG TERM GOAL #3   Title R thumb AROM increase for pt to abd  to grasp objects or utenciles of 1-2 inches    Baseline No AROM RA - PA 1/3 range without gravitiy -  cannot grip any objects or release - NOW can release - thumb PA but no RA -and not able to oppose or do any pinching funcitonally    Time 12    Period Weeks    Status On-going    Target Date 08/28/20      OT LONG TERM GOAL #4   Title R wrist AROM increase for pt to maintain wrist with grip to carry 1 lbs objects in ADL's    Baseline wrist drop - keep in 60 degrees flexion - cannot make fist - only when keep wrist with other hand at neutrral   NOW has 20 degrees AROM from 70 flexion to -50 extention -    Time 12    Period Weeks    Status On-going    Target Date 08/28/20      OT LONG TERM GOAL #5   Title Pt R elbow strength increase for pt to pick up or carry more than 4 lbs - wiht wrist splint if needed    Baseline no strength -just started therapy - radial nerve injury - wrist drop -and elbow AROM -40 and flexion 105   NOW -20 to -25 , flexion 120 - carry 2 lbs    Time 8    Period Weeks    Status On-going    Target Date 07/31/20                 Plan - 06/05/20 1716    Clinical Impression Statement Pt is 16 wks s/p R supracondylar fx with ORIF and radial N palsy.  She for weeks dealt with her son's  pasting and trying to get his estate sorted out and completed.  She decreased since death to 1 x wk. Elbow flexion stays about 120 - did initiate using body weight for stretch this week - increase in session to 130 - ? or recommend LANTZ splint for pt to use at home for elbow flexion - extention -25. Pt this date showed wrist extention for the first time- about 20 degrees of active motion and in session 30 degrees after stretches. Pt prefer wrist splint to give her more functional hand to grip and use hand during day. Appear Tinel now no at forearm but more towards dorsal wrist. Digits extention increase to -50 and -60 degrees at 2nd and 3rd MC - 4thand 5th MC extnetion still -70 and -80 degrees. Able t o makde full fist and thumb PA - but not RA yet. Continue to work towards goals in plan  of care to improve ROM, strength and use of RUE In daily tasks. Request order for LANTZ splint for pt to use for elbow flexion and if needed extention    OT Occupational Profile and History Problem Focused Assessment - Including review of records relating to presenting problem    Occupational performance deficits (Please refer to evaluation for details): ADL's;IADL's;Play;Rest and Sleep;Leisure;Social Participation    Body Structure / Function / Physical Skills ADL;Flexibility;ROM;UE functional use;FMC;Decreased knowledge of use of DME;Dexterity;Sensation;Strength;Pain;IADL    Rehab Potential Good    Clinical Decision Making Limited treatment options, no task modification necessary    Comorbidities Affecting Occupational Performance: None    Modification or Assistance to Complete Evaluation  No modification of tasks or assist necessary to complete eval    OT Frequency 1x / week    OT  Duration 8 weeks    OT Treatment/Interventions Self-care/ADL training;Moist Heat;Paraffin;DME and/or AE instruction;Manual Therapy;Passive range of motion;Scar mobilization;Splinting;Patient/family education;Therapeutic exercise    Consulted and Agree with Plan of Care Patient           Patient will benefit from skilled therapeutic intervention in order to improve the following deficits and impairments:   Body Structure / Function / Physical Skills: ADL,Flexibility,ROM,UE functional use,FMC,Decreased knowledge of use of DME,Dexterity,Sensation,Strength,Pain,IADL       Visit Diagnosis: Compression of right radial nerve - Plan: Ot plan of care cert/re-cert  Stiffness of right elbow, not elsewhere classified - Plan: Ot plan of care cert/re-cert  Stiffness of right wrist, not elsewhere classified - Plan: Ot plan of care cert/re-cert  Muscle weakness (generalized) - Plan: Ot plan of care cert/re-cert  Stiffness of right hand, not elsewhere classified - Plan: Ot plan of care cert/re-cert    Problem  List Patient Active Problem List   Diagnosis Date Noted  . Right wrist drop 03/25/2020  . Psychophysiological insomnia 10/06/2019  . Nonintractable headache 10/06/2019  . Urinary incontinence 10/06/2019  . Internal and external bleeding hemorrhoids 08/15/2019  . Sacral pain   . Radicular pain of sacrum 07/11/2019  . Sacral fracture, closed ([place]) 07/04/2019  . Fall 07/04/2019  . Bilateral pubic rami fractures, sequela 07/04/2019  . Background diabetic retinopathy ([place]) 01/23/2019  . Schatzki's ring 03/04/2017  . Post herpetic neuralgia 10/06/2016  . Hx of colonic polyps 09/22/2016  . Osteopenia 09/02/2016  . Anxiety associated with depression 08/25/2016  . Trapezius muscle spasm 05/25/2016  . Intermittent left lower quadrant abdominal pain 03/16/2016  . Anemia in chronic kidney disease (CKD) 02/26/2016  . Type 2 diabetes mellitus with stage 4 chronic kidney disease ([place]) 02/25/2016  . Hypothyroidism 02/25/2016  . Constipation 02/25/2016  . GERD (gastroesophageal reflux disease) 02/25/2016  . Hyperlipidemia associated with type 2 diabetes mellitus ([place]) 02/25/2016  . DJD (degenerative joint disease) of cervical spine 02/25/2016  . Osteoarthritis of multiple joints 02/25/2016  . Hiatal hernia 02/25/2016  . CKD stage 3 due to type 2 diabetes mellitus ([place]) 02/25/2016  . Chronic kidney disease, unspecified 09/05/2013  . Benign hypertension with CKD (chronic kidney disease) stage III ([place]) 09/05/2013  . CMC arthritis, thumb, degenerative 06/15/2013  . Onychomycosis due to dermatophyte 05/25/2013  . Acquired keratoderma 05/25/2013    Rosalyn Gess OTR/L,CLT 06/05/2020, 5:30 PM  [place] [facility] [address], [place], [address] Phone: [phone]   Fax:  [phone]  Name: Lisa Mills MRN: [id number] Date of Birth: 06/18/1929

## 2020-06-10 DIAGNOSIS — S42411D Displaced simple supracondylar fracture without intercondylar fracture of right humerus, subsequent encounter for fracture with routine healing: Secondary | ICD-10-CM | POA: Diagnosis not present

## 2020-06-12 ENCOUNTER — Other Ambulatory Visit: Payer: Self-pay

## 2020-06-12 ENCOUNTER — Ambulatory Visit: Payer: PPO | Admitting: Occupational Therapy

## 2020-06-12 DIAGNOSIS — M25621 Stiffness of right elbow, not elsewhere classified: Secondary | ICD-10-CM

## 2020-06-12 DIAGNOSIS — M25641 Stiffness of right hand, not elsewhere classified: Secondary | ICD-10-CM

## 2020-06-12 DIAGNOSIS — G5631 Lesion of radial nerve, right upper limb: Secondary | ICD-10-CM | POA: Diagnosis not present

## 2020-06-12 DIAGNOSIS — M25631 Stiffness of right wrist, not elsewhere classified: Secondary | ICD-10-CM

## 2020-06-12 DIAGNOSIS — M6281 Muscle weakness (generalized): Secondary | ICD-10-CM

## 2020-06-12 NOTE — Therapy (Signed)
[place] [facility] [address], [place], [address] Phone: [phone]   Fax:  [phone]  Occupational Therapy Treatment  Patient Details  Name: Lisa Mills MRN: [id number] Date of Birth: 1929/07/20 Referring Provider (OT): Dr Marcelino Scot   Encounter Date: 06/12/2020   OT End of Session - 06/12/20 1727    Visit Number 11    Number of Visits 17    Date for OT Re-Evaluation 08/07/20    OT Start Time 1620    OT Stop Time 1701    OT Time Calculation (min) 41 min    Activity Tolerance Patient tolerated treatment well    Behavior During Therapy [facility] for tasks assessed/performed           Past Medical History:  Diagnosis Date  . Anemia   . Anxiety   . Chronic kidney disease    stage 3-4  . Constipation   . Depression   . Dermatophytosis of nail   . Diabetes mellitus without complication ([place])    Diet controlled, lost weight, no meds  . GERD (gastroesophageal reflux disease)   . Hemorrhoids   . History of shingles   . HLD (hyperlipidemia)   . Hypertension   . Hypothyroidism   . Keratoderma   . Restless legs syndrome (RLS)    hx    Past Surgical History:  Procedure Laterality Date  . BREAST BIOPSY Right 09/30/2017   Affirm bx-calcs ( X clip),benign  . BREAST EXCISIONAL BIOPSY Right    neg  . cataracts    . COLONOSCOPY W/ POLYPECTOMY    . COLONOSCOPY WITH PROPOFOL N/A 02/10/2017   Procedure: COLONOSCOPY WITH PROPOFOL;  Surgeon: Manya Silvas, MD;  Location: [facility] ENDOSCOPY;  Service: Endoscopy;  Laterality: N/A;  . ESOPHAGOGASTRODUODENOSCOPY (EGD) WITH PROPOFOL N/A 02/10/2017   Procedure: ESOPHAGOGASTRODUODENOSCOPY (EGD) WITH PROPOFOL;  Surgeon: Manya Silvas, MD;  Location: [facility] ENDOSCOPY;  Service: Endoscopy;  Laterality: N/A;  . EYE SURGERY     bilateral cataracts  . HAND SURGERY    . KIDNEY STONE SURGERY    . ORIF HUMERUS FRACTURE Right 02/13/2020   Procedure: OPEN REDUCTION INTERNAL  FIXATION (ORIF) SUPRACONDYLAR  HUMERUS FRACTURE;  Surgeon: Altamese Sidney, MD;  Location: [facility];  Service: Orthopedics;  Laterality: Right;  . SACROPLASTY N/A 07/06/2019   Procedure: SACROPLASTY;  Surgeon: Hessie Knows, MD;  Location: [facility] ORS;  Service: Orthopedics;  Laterality: N/A;  . TONSILECTOMY, ADENOIDECTOMY, BILATERAL MYRINGOTOMY AND TUBES    . TONSILLECTOMY    . TRIGGER FINGER RELEASE     Left ring finger  . tubercular peritonitis      There were no vitals filed for this visit.   Subjective Assessment - 06/12/20 1725    Subjective  I seen Dr Marcelino Scot and sending me for nerve conduction - but do you know of one in town, and then send prescription for my wrist for dynamic splint and elbow brace - but lookI can reach my ear and back of head now - could fix my hair ,and try to write and it is better wtih the splint on-  I could also pull my my pants with my hand now    Pertinent History Pt fell and ended up with R supracondylar fx - ORIF done by Dr Marcelino Scot -pt also with radial N palsy - was suppose to have [facility] PT but never got it -and refer now by Dr Marcelino Scot to outpt OT on 04/03/20 - for ROM and  possible radial N palsy custom splint    Patient Stated Goals Want to use my R hand to eat, do my hair, makeup, open doors, cook and make bed as well as drive again    Currently in Pain? No/denies               Elbow flexion 128 degrees , ext -20  Report she could write better with wrist splint on , fix her hair - could tease it and pull up pants Elbow bending better per pt - can reach behind her head, ear and mouth - if wrist can be better  Surgeon send order for Dynamic Radial N palsy splint and LANTZ elbow splint - pt more interested this date for radial N splint - hold off on elbow- because of progress this past week    Review again with pt elbow and forearm on table - pt to use lightly her body weight bending fw and stretch - 5 x 30 sec to 1 min  Interlock hands and slide down back of heat -  can keep wrist splint on - for 5 x hold 50 sec  3 x day  Cont with2lbs weight - with wrist brace on - elbowto 90 andflexion to ear -12 reps x 2 - elbow on table  To do at home - verbalize understanding   massage webspace-PROM to PA and RA and flexion of thumb AROM for thumb PA 10 reps PROM for RA of thumb -10 reps     PROM and hold wrist extention stretch  10reps hold 5 sec table slides for wrist extention on table - on towel - 20 reps- weight bearing thru palm - composite extention stretch prior to  Wrist extention AROM and  place and hold wrist ext- 8 reps several times during day        Appear Radial N improving-was  mid forearm but now more distal  wrist and initiation of wrist extention this week -  Less tight fist - able to open hand more Did find prefab dynamic radial N palsy splint - with good reviews - info send to [facility] and they will contact pt to order  Hold off at the moment on ordering LANTZ for elbow per pt request - did measure her                      OT Education - 06/12/20 1727    Education Details progress and splint ordering    Person(s) Educated Patient;Child(ren)    Methods Explanation;Demonstration;Tactile cues;Verbal cues;Handout    Comprehension Verbal cues required;Returned demonstration;Verbalized understanding            OT Short Term Goals - 06/12/20 1728      OT SHORT TERM GOAL #1   Title Pt to be independent in HEP to decrease tightness in flexors of R forearm and hand to initiate AROM for wrist and digits extention    Baseline Pt did not had any therapy since surgery - HH could not come - very tight and stiff -    Time 4    Period Weeks    Status Achieved             OT Long Term Goals - 06/12/20 1729      OT LONG TERM GOAL #1   Title R elbow flexion and extention increase with more than 20 degrees to apply deodorant, wash hair and bath chest and neck    Baseline Flexion  105 R elbow, extention -40 -   NOW elbow flexion 128 , extention -20 to -25  wrist extention limiting her functional use    Time 6    Period Weeks    Status On-going    Target Date 07/24/20      OT LONG TERM GOAL #2   Title R wrist extention improve for pt to maintain wrist neutral for to bath herself and wipe counters    Baseline R wrist and digits flexors tight -and keep wrist in 60 degrees flexion - unable to open or extend MC's and wrist- this date able to extend wrist from flexion 20 degrees , MC extention -50 to -80 degrees, PIP extention WNL    Time 8    Period Weeks    Target Date 08/07/20      OT LONG TERM GOAL #3   Title R thumb AROM increase for pt to abd to grasp objects or utenciles of 1-2 inches    Baseline No AROM RA - PA 1/3 range without gravitiy - cannot grip any objects or release - NOW can release - thumb PA but no RA -can do pinching now like fix her hair, pull up pants ,and write some    Status Achieved      OT LONG TERM GOAL #4   Title R wrist AROM increase for pt to maintain wrist with grip to carry 1 lbs objects in ADL's    Baseline wrist drop - keep in 60 degrees flexion - cannot make fist - only when keep wrist with other hand at neutrral   NOW has 20 degrees AROM from 70 flexion to -50 extention - still collapse into flexion    Time 8    Period Weeks    Status On-going    Target Date 08/07/20      OT LONG TERM GOAL #5   Title Pt R elbow strength increase for pt to pick up or carry more than 4 lbs - wiht wrist splint if needed    Baseline no strength -just started therapy - radial nerve injury - wrist drop -and elbow AROM -40 and flexion 105   NOW -20 to -25 , flexion 120 - carry 2 lbs    Time 8    Period Weeks    Status On-going    Target Date 08/07/20                 Plan - 06/12/20 1728    Clinical Impression Statement Pt is 17 wks s/p R supracondylar fx with ORIF and radial N palsy.  She for weeks dealt with her son's  pasting and trying to  get his estate sorted out and completed.  She decreased since death to 1 x wk. Elbow flexion stays about 120 - did initiate using body weight for stretch last  week - increased flexion this date to 128- ? recommend LANTZ splint for pt to use at home for elbow flexion - extention -25 but hold off because of progress. Pt more interested in dynamic wrsit and hand splint for Radial N palsy - contacted this date [facility] and they can order it - info send and they will contact pt to measure,  Pt show about 20 degrees of active motion  in wrist extention and in session 30 degrees after stretches. Pt prefer wrist splint to give her more functional hand to grip and use hand during day. Appear Tinel now no at forearm but more towards dorsal wrist. Digits  extention increase to -50 and -60 degrees at 2nd and 3rd MC - 4thand 5th MC extnetion still -70 and -80 degrees. Able t o makde full fist and thumb PA - but not RA yet. Pt report increase use of R hand in fixing her hair, writing and pulling up her patns. Continue to work towards goals in plan of care to improve ROM, strength and use of RUE In daily tasks. Request order for LANTZ splint for pt to use for elbow flexion and if needed extention- per pt will hold off ordering it at this time..    OT Occupational Profile and History Problem Focused Assessment - Including review of records relating to presenting problem    Occupational performance deficits (Please refer to evaluation for details): ADL's;IADL's;Play;Rest and Sleep;Leisure;Social Participation    Body Structure / Function / Physical Skills ADL;Flexibility;ROM;UE functional use;FMC;Decreased knowledge of use of DME;Dexterity;Sensation;Strength;Pain;IADL    Rehab Potential Good    Clinical Decision Making Limited treatment options, no task modification necessary    Comorbidities Affecting Occupational Performance: None    Modification or Assistance to Complete Evaluation  No modification of tasks or assist  necessary to complete eval    OT Frequency 1x / week   to biweekly depending on progress   OT Duration 8 weeks    OT Treatment/Interventions Self-care/ADL training;Moist Heat;Paraffin;DME and/or AE instruction;Manual Therapy;Passive range of motion;Scar mobilization;Splinting;Patient/family education;Therapeutic exercise    Consulted and Agree with Plan of Care Patient           Patient will benefit from skilled therapeutic intervention in order to improve the following deficits and impairments:   Body Structure / Function / Physical Skills: ADL,Flexibility,ROM,UE functional use,FMC,Decreased knowledge of use of DME,Dexterity,Sensation,Strength,Pain,IADL       Visit Diagnosis: Compression of right radial nerve  Stiffness of right elbow, not elsewhere classified  Stiffness of right wrist, not elsewhere classified  Muscle weakness (generalized)  Stiffness of right hand, not elsewhere classified    Problem List Patient Active Problem List   Diagnosis Date Noted  . Right wrist drop 03/25/2020  . Psychophysiological insomnia 10/06/2019  . Nonintractable headache 10/06/2019  . Urinary incontinence 10/06/2019  . Internal and external bleeding hemorrhoids 08/15/2019  . Sacral pain   . Radicular pain of sacrum 07/11/2019  . Sacral fracture, closed ([place]) 07/04/2019  . Fall 07/04/2019  . Bilateral pubic rami fractures, sequela 07/04/2019  . Background diabetic retinopathy ([place]) 01/23/2019  . Schatzki's ring 03/04/2017  . Post herpetic neuralgia 10/06/2016  . Hx of colonic polyps 09/22/2016  . Osteopenia 09/02/2016  . Anxiety associated with depression 08/25/2016  . Trapezius muscle spasm 05/25/2016  . Intermittent left lower quadrant abdominal pain 03/16/2016  . Anemia in chronic kidney disease (CKD) 02/26/2016  . Type 2 diabetes mellitus with stage 4 chronic kidney disease ([place]) 02/25/2016  . Hypothyroidism 02/25/2016  . Constipation 02/25/2016  . GERD (gastroesophageal  reflux disease) 02/25/2016  . Hyperlipidemia associated with type 2 diabetes mellitus ([place]) 02/25/2016  . DJD (degenerative joint disease) of cervical spine 02/25/2016  . Osteoarthritis of multiple joints 02/25/2016  . Hiatal hernia 02/25/2016  . CKD stage 3 due to type 2 diabetes mellitus ([place]) 02/25/2016  . Chronic kidney disease, unspecified 09/05/2013  . Benign hypertension with CKD (chronic kidney disease) stage III ([place]) 09/05/2013  . CMC arthritis, thumb, degenerative 06/15/2013  . Onychomycosis due to dermatophyte 05/25/2013  . Acquired keratoderma 05/25/2013    Rosalyn Gess OTR/L,CLT 06/12/2020, 5:38 PM  [place]  [facility] [address], [place], [address] Phone: [phone]   Fax:  [phone]  Name: Lisa Mills MRN: [id number] Date of Birth: [birth year]

## 2020-06-26 ENCOUNTER — Ambulatory Visit: Payer: PPO | Admitting: Occupational Therapy

## 2020-07-01 ENCOUNTER — Other Ambulatory Visit: Payer: Self-pay

## 2020-07-01 ENCOUNTER — Ambulatory Visit (INDEPENDENT_AMBULATORY_CARE_PROVIDER_SITE_OTHER): Payer: PPO | Admitting: Family Medicine

## 2020-07-01 ENCOUNTER — Encounter: Payer: Self-pay | Admitting: Family Medicine

## 2020-07-01 VITALS — BP 144/67 | HR 64 | Ht 59.0 in | Wt 106.2 lb

## 2020-07-01 DIAGNOSIS — E038 Other specified hypothyroidism: Secondary | ICD-10-CM | POA: Diagnosis not present

## 2020-07-01 DIAGNOSIS — N183 Chronic kidney disease, stage 3 unspecified: Secondary | ICD-10-CM

## 2020-07-01 DIAGNOSIS — D631 Anemia in chronic kidney disease: Secondary | ICD-10-CM | POA: Diagnosis not present

## 2020-07-01 DIAGNOSIS — N1831 Chronic kidney disease, stage 3a: Secondary | ICD-10-CM | POA: Diagnosis not present

## 2020-07-01 DIAGNOSIS — I129 Hypertensive chronic kidney disease with stage 1 through stage 4 chronic kidney disease, or unspecified chronic kidney disease: Secondary | ICD-10-CM | POA: Diagnosis not present

## 2020-07-01 DIAGNOSIS — E1122 Type 2 diabetes mellitus with diabetic chronic kidney disease: Secondary | ICD-10-CM | POA: Diagnosis not present

## 2020-07-01 DIAGNOSIS — E063 Autoimmune thyroiditis: Secondary | ICD-10-CM

## 2020-07-01 LAB — POCT GLYCOSYLATED HEMOGLOBIN (HGB A1C): Hemoglobin A1C: 8.4 % — AB (ref 4.0–5.6)

## 2020-07-01 NOTE — Assessment & Plan Note (Signed)
CKD-III, secondary to HTN DM age Followed by Nephrology [facility] Dr Candiss Norse  Plan: 1. Continue ACEi Lisinopril low dose 5mg  Improve hydration Follow-up  Return to Dr Candiss Norse - new referral to return

## 2020-07-01 NOTE — Assessment & Plan Note (Signed)
Mildly elevated initial BP, repeat manual check improved. - Home BP readings reviewed  Complication with CKDIII   Plan:  1. Continue current BP regimen Lisinopril 5mg  daily - REFERRAL Back to [facility] return for evaluation 2. Encourage improved lifestyle - low sodium diet, regular exercise 3. Continue monitor BP outside office, bring readings to next visit, if persistently >140/90 or new symptoms notify office sooner

## 2020-07-01 NOTE — Assessment & Plan Note (Signed)
A1c up to 8.4 still mild elevated Complications - at risk hypoglycemia, nephropathy with CKD III-IV, also in setting HTN - Contraindicated - SGLT2. H/o failed Glyburide, Januvia. Not interested in injectable - Off metformin in past due to CKD - off januvia ineffective  Plan:  1. Remain off Sulfonylurea avoid hypoglycemia 2. Cannot take ASA due to CKD, continue statin, on ACEi 3. Encouraged lifestyle improvements with DM diet, continue walking

## 2020-07-01 NOTE — Progress Notes (Signed)
Subjective:    Patient ID: Lisa Mills, female    DOB: [birth year], 87 y.o.   MRN: [id number]  LABRIA Mills is a 87 y.o. female presenting on 07/01/2020 for Diabetes and Hypertension   HPI  Type 2 Diabetes CKD IIIa Anemia CKD HTN  Followed by Dr Candiss Norse in past for CKD >3+ years ago A1c due today prior 8.3 She remains off Glimepiride On Lisinopril 5mg  Not on sugar medications Admits not adhering to low sugar diet, eats ice cream, limits carbs tho with meals, family helps her with meal prep at times. No hypoglycemia  Cerumen / ENT Resolved cerumen previously, now awaiting upcoming apt return to [place] Also has some occasional pain and cerumen impaction.    Depression screen [facility] 2/9 07/01/2020 11/28/2019 09/14/2019  Decreased Interest 1 0 0  Down, Depressed, Hopeless 0 0 0  PHQ - 2 Score 1 0 0  Altered sleeping 0 - 3  Tired, decreased energy 1 - 1  Change in appetite 0 - 2  Feeling bad or failure about yourself  0 - 0  Trouble concentrating 0 - 0  Moving slowly or fidgety/restless 0 - 0  Suicidal thoughts 0 - 0  PHQ-9 Score 2 - 6  Difficult doing work/chores Not difficult at all - Somewhat difficult  Some recent data might be hidden    Social History   Tobacco Use   Smoking status: Never   Smokeless tobacco: Never  Vaping Use   Vaping Use: Never used  Substance Use Topics   Alcohol use: Not Currently   Drug use: No    Review of Systems Per HPI unless specifically indicated above     Objective:    BP (!) 144/67 (BP Location: Left Arm, Patient Position: Sitting)   Pulse 64   Ht 4\' 11"  (1.499 m)   Wt 106 lb 3.2 oz (48.2 kg)   LMP  (LMP Unknown)   SpO2 99%   BMI 21.45 kg/m   Wt Readings from Last 3 Encounters:  07/01/20 106 lb 3.2 oz (48.2 kg)  03/25/20 106 lb (48.1 kg)  02/13/20 108 lb (49 kg)    Physical Exam Vitals and nursing note reviewed.  Constitutional:      General: She is not in acute distress.    Appearance: She is well-developed.  She is not diaphoretic.     Comments: Well-appearing, comfortable, cooperative  HENT:     Head: Normocephalic and atraumatic.  Eyes:     General:        Right eye: No discharge.        Left eye: No discharge.     Conjunctiva/sclera: Conjunctivae normal.  Neck:     Thyroid: No thyromegaly.  Cardiovascular:     Rate and Rhythm: Normal rate and regular rhythm.     Heart sounds: Normal heart sounds. No murmur heard. Pulmonary:     Effort: Pulmonary effort is normal. No respiratory distress.     Breath sounds: Normal breath sounds. No wheezing or rales.  Musculoskeletal:        General: Normal range of motion.     Cervical back: Normal range of motion and neck supple.  Lymphadenopathy:     Cervical: No cervical adenopathy.  Skin:    General: Skin is warm and dry.     Findings: No erythema or rash.  Neurological:     Mental Status: She is alert and oriented to person, place, and time.  Psychiatric:  Behavior: Behavior normal.     Comments: Well groomed, good eye contact, normal speech and thoughts     Results for orders placed or performed in visit on 07/01/20  POCT HgB A1C  Result Value Ref Range   Hemoglobin A1C 8.4 (A) 4.0 - 5.6 %      Assessment & Plan:   Problem List Items Addressed This Visit     Type 2 diabetes with stage 3 chronic kidney disease GFR 30-59 ([place]) - Primary    A1c up to 8.4 still mild elevated Complications - at risk hypoglycemia, nephropathy with CKD III-IV, also in setting HTN - Contraindicated - SGLT2. H/o failed Glyburide, Januvia. Not interested in injectable - Off metformin in past due to CKD - off januvia ineffective  Plan:  1. Remain off Sulfonylurea avoid hypoglycemia 2. Cannot take ASA due to CKD, continue statin, on ACEi 3. Encouraged lifestyle improvements with DM diet, continue walking       Relevant Orders   POCT HgB A1C (Completed)   Ambulatory referral to Nephrology   Hemoglobin A1c   Hypothyroidism    Continue  current dose Levothyroxine re-check labs TSH T4 in 4 months       Relevant Orders   TSH   T4, free   CKD stage 3 due to type 2 diabetes mellitus ([place])    CKD-III, secondary to HTN DM age Followed by Nephrology [facility] Dr Candiss Norse  Plan: 1. Continue ACEi Lisinopril low dose 5mg  Improve hydration Follow-up  Return to Dr Candiss Norse - new referral to return       Relevant Orders   Ambulatory referral to Nephrology   COMPLETE METABOLIC PANEL WITH GFR   Benign hypertension with CKD (chronic kidney disease) stage III ([place])    Mildly elevated initial BP, repeat manual check improved. - Home BP readings reviewed  Complication with CKDIII   Plan:  1. Continue current BP regimen Lisinopril 5mg  daily - REFERRAL Back to [facility] return for evaluation 2. Encourage improved lifestyle - low sodium diet, regular exercise 3. Continue monitor BP outside office, bring readings to next visit, if persistently >140/90 or new symptoms notify office sooner       Relevant Orders   Ambulatory referral to Nephrology   COMPLETE METABOLIC PANEL WITH GFR   Hemoglobin A1c   Anemia in chronic kidney disease (CKD)   Relevant Orders   Ambulatory referral to Nephrology   COMPLETE METABOLIC PANEL WITH GFR   CBC with Differential/Platelet    No orders of the defined types were placed in this encounter.  Orders Placed This Encounter  Procedures   COMPLETE METABOLIC PANEL WITH GFR    Standing Status:   Future    Standing Expiration Date:   01/18/2021   Hemoglobin A1c    Standing Status:   Future    Standing Expiration Date:   01/18/2021   TSH    Standing Status:   Future    Standing Expiration Date:   01/18/2021   T4, free    Standing Status:   Future    Standing Expiration Date:   01/18/2021   CBC with Differential/Platelet    Standing Status:   Future    Standing Expiration Date:   01/18/2021   Ambulatory referral to Nephrology    Referral Priority:   Routine    Referral Type:   Consultation     Referral Reason:   Specialty Services Required    Requested Specialty:   Nephrology    Number of  Visits Requested:   1   POCT HgB A1C      Follow up plan: Return in about 4 months (around 10/31/2020) for 4 month follow-up fasting lab only then 1 week later Follow-up Diabetes, HTN CKD Thyroid.  Future labs ordered for 10/2020 CBC A1c, TSH T4 CMET   Nobie Putnam, DO [facility] 07/01/2020, 11:45 AM

## 2020-07-01 NOTE — Patient Instructions (Addendum)
Thank you for coming to the office today.  Please go ahead and call Dr Richardson Landry to do the ear wax cleaning and evaluation of the vertigo inner ear problem.  I will send referral to Dr Candiss Norse for kidney function.  Recent Labs    09/06/19 0808 03/25/20 1658 07/01/20 1145  HGBA1C 6.6* 8.3* 8.4*   DUE for FASTING BLOOD WORK (no food or drink after midnight before the lab appointment, only water or coffee without cream/sugar on the morning of)  SCHEDULE "Lab Only" visit in the morning at the clinic for lab draw in 4 MONTHS   - Make sure Lab Only appointment is at about 1 week before your next appointment, so that results will be available  For Lab Results, once available within 2-3 days of blood draw, you can can log in to MyChart online to view your results and a brief explanation. Also, we can discuss results at next follow-up visit.    Please schedule a Follow-up Appointment to: No follow-ups on file.  If you have any other questions or concerns, please feel free to call the office or send a message through [facility]. You may also schedule an earlier appointment if necessary.  Additionally, you may be receiving a survey about your experience at our office within a few days to 1 week by e-mail or mail. We value your feedback.  Nobie Putnam, DO [facility]

## 2020-07-01 NOTE — Assessment & Plan Note (Signed)
Continue current dose Levothyroxine re-check labs TSH T4 in 4 months

## 2020-07-05 ENCOUNTER — Other Ambulatory Visit: Payer: PPO

## 2020-07-05 ENCOUNTER — Other Ambulatory Visit: Payer: Self-pay

## 2020-07-05 ENCOUNTER — Telehealth: Payer: Self-pay

## 2020-07-05 DIAGNOSIS — Z515 Encounter for palliative care: Secondary | ICD-10-CM

## 2020-07-05 NOTE — Progress Notes (Signed)
PATIENT NAME: Lisa Mills DOB: Jun 23, 1929 MRN: [id number]  PRIMARY CARE PROVIDER: Olin Hauser, DO  RESPONSIBLE PARTY:  Acct ID - Guarantor Home Phone Work Phone Relationship Acct Type  [id number] MIIA, [phone]  Self P/F     [place], [place], [place] [address]   TELEHEALTH VISIT STATEMENT Due to the COVID-19 crisis, this visit was done via telemedicine from my office and it was initiated and consent by this patient and or family.  PLAN OF CARE and INTERVENTIONS:               1.  GOALS OF CARE/ ADVANCE CARE PLANNING:  Remain home and independent for as long as possible.               2.  PATIENT/CAREGIVER EDUCATION:  Blood pressure and blood sugar readings.               4. PERSONAL EMERGENCY PLAN:  Activate 911 for emergencies.               5.  DISEASE STATUS:  Telephonic visit completed with patient to follow up on overall condition and needs.  Patient states she has been doing "okay".   She is currently awaiting a new brace for her wrist.  Patient reports states since her elbow surgery, she has been experiencing a "limp wrist".  This has complicated activities of daily living.  She notes it is a challenge to obtain blood sugars and blood pressures due to this issue.  Patient recalls her elevated Hgb A1C and reports she does eat more "junk" than she should.  Patient states her blood pressures have been within the range recommended by her PCP.  We discussed the importance of monitoring blood pressures and blood sugars and notifying PCP of abnormal readings.   Patient shares her private caregiver has not been coming due to a family member who has been ill.  Patient did not want to hire someone new yet.  She states she will wait a few weeks to see if her caregiver can return.    Son continues to assist as needed.  Patient feels she is managing household chores well at this time.   HISTORY OF PRESENT ILLNESS:  87 year old female with CKD and Diabetes.  Patient is  being followed Palliative Care every 4-8 weeks and PRN.  CODE STATUS: Full ADVANCED DIRECTIVES: No MOST FORM: No PPS: 50%        Lorenza Burton, RN

## 2020-07-05 NOTE — Telephone Encounter (Signed)
1229 pm.  Phone call made to patient to complete a telephonic visit or schedule a home visit.  No answer but HIPPA compliant message has been left requesting a call back.

## 2020-07-11 ENCOUNTER — Other Ambulatory Visit: Payer: Self-pay

## 2020-07-11 ENCOUNTER — Ambulatory Visit: Payer: PPO | Attending: Family Medicine | Admitting: Occupational Therapy

## 2020-07-11 DIAGNOSIS — M6281 Muscle weakness (generalized): Secondary | ICD-10-CM | POA: Diagnosis not present

## 2020-07-11 DIAGNOSIS — M25641 Stiffness of right hand, not elsewhere classified: Secondary | ICD-10-CM | POA: Diagnosis not present

## 2020-07-11 DIAGNOSIS — M25621 Stiffness of right elbow, not elsewhere classified: Secondary | ICD-10-CM | POA: Insufficient documentation

## 2020-07-11 DIAGNOSIS — M25631 Stiffness of right wrist, not elsewhere classified: Secondary | ICD-10-CM | POA: Diagnosis not present

## 2020-07-11 NOTE — Therapy (Signed)
[facility] [address], [place], [address] Phone: [phone]   Fax:  [phone]  Occupational Therapy Treatment  Patient Details  Name: Lisa Mills MRN: [id number] Date of Birth: November 22, 1929 Referring Provider (OT): Dr Marcelino Scot   Encounter Date: 07/11/2020   OT End of Session - 07/11/20 1708     Visit Number 12    Number of Visits 17    Date for OT Re-Evaluation 08/07/20    OT Start Time 1531    OT Stop Time 1604    OT Time Calculation (min) 33 min    Activity Tolerance Patient tolerated treatment well    Behavior During Therapy [facility] for tasks assessed/performed             Past Medical History:  Diagnosis Date   Anemia    Anxiety    Chronic kidney disease    stage 3-4   Constipation    Depression    Dermatophytosis of nail    Diabetes mellitus without complication ([place])    Diet controlled, lost weight, no meds   GERD (gastroesophageal reflux disease)    Hemorrhoids    History of shingles    HLD (hyperlipidemia)    Hypertension    Hypothyroidism    Keratoderma    Restless legs syndrome (RLS)    hx    Past Surgical History:  Procedure Laterality Date   BREAST BIOPSY Right 09/30/2017   Affirm bx-calcs ( X clip),benign   BREAST EXCISIONAL BIOPSY Right    neg   cataracts     COLONOSCOPY W/ POLYPECTOMY     COLONOSCOPY WITH PROPOFOL N/A 02/10/2017   Procedure: COLONOSCOPY WITH PROPOFOL;  Surgeon: Manya Silvas, MD;  Location[facility] ENDOSCOPY;  Service: Endoscopy;  Laterality: N/A;   ESOPHAGOGASTRODUODENOSCOPY (EGD) WITH PROPOFOL N/A 02/10/2017   Procedure: ESOPHAGOGASTRODUODENOSCOPY (EGD) WITH PROPOFOL;  Surgeon: Manya Silvas, MD;  Location: [facility] ENDOSCOPY;  Service: Endoscopy;  Laterality: N/A;   EYE SURGERY     bilateral cataracts   HAND SURGERY     KIDNEY STONE SURGERY     ORIF HUMERUS FRACTURE Right 02/13/2020   Procedure: OPEN REDUCTION INTERNAL FIXATION (ORIF)  SUPRACONDYLAR  HUMERUS FRACTURE;  Surgeon: Altamese Fairland, MD;  Location: [facility];  Service: Orthopedics;  Laterality: Right;   SACROPLASTY N/A 07/06/2019   Procedure: SACROPLASTY;  Surgeon: Hessie Knows, MD;  Location: [facility] ORS;  Service: Orthopedics;  Laterality: N/A;   TONSILECTOMY, ADENOIDECTOMY, BILATERAL MYRINGOTOMY AND TUBES     TONSILLECTOMY     TRIGGER FINGER RELEASE     Left ring finger   tubercular peritonitis      There were no vitals filed for this visit.   Subjective Assessment - 07/11/20 1707     Subjective  I have been bending my elbow and using the 2 lbs - told DR I do not want to see him until got better , using radial N splint and nerve conduction - using it some but still hard iwth the wrist    Pertinent History Pt fell and ended up with R supracondylar fx - ORIF done by Dr Marcelino Scot -pt also with radial N palsy - was suppose to have [facility] PT but never got it -and refer now by Dr Marcelino Scot to outpt OT on 04/03/20 - for ROM and possible radial N palsy custom splint    Patient Stated Goals Want to use my R hand to eat, do my hair, makeup, open doors, cook  and make bed as well as drive again    Currently in Pain? No/denies                Pt arrive with about same AROM for R elbow and hand  Fitted with prefab radial N palsy splint - pt able to pull and push door open , fold laundry and carry 6 lbs , but have trouble lifting 2 lbs - use shoulder more than elbow  Pt to cont with 2 lbs weight for focus on elbow flexion , exention -sup /pro  Pt ed on wearing splint and increase gradually wearing time - webspace she always have trouble with redness  To use with functional task of hand and 2 lbs elbow flexion HEP  Pt wants to hold off still on elbow splint LANTZ  And did not schedule nerve conduction test yet  Pt to follow up in 3 wks                     OT Short Term Goals - 06/12/20 1728       OT SHORT TERM GOAL #1   Title Pt to be independent in HEP to  decrease tightness in flexors of R forearm and hand to initiate AROM for wrist and digits extention    Baseline Pt did not had any therapy since surgery - HH could not come - very tight and stiff -    Time 4    Period Weeks    Status Achieved               OT Long Term Goals - 06/12/20 1729       OT LONG TERM GOAL #1   Title R elbow flexion and extention increase with more than 20 degrees to apply deodorant, wash hair and bath chest and neck    Baseline Flexion 105 R elbow, extention -40 -   NOW elbow flexion 128 , extention -20 to -25  wrist extention limiting her functional use    Time 6    Period Weeks    Status On-going    Target Date 07/24/20      OT LONG TERM GOAL #2   Title R wrist extention improve for pt to maintain wrist neutral for to bath herself and wipe counters    Baseline R wrist and digits flexors tight -and keep wrist in 60 degrees flexion - unable to open or extend MC's and wrist- this date able to extend wrist from flexion 20 degrees , MC extention -50 to -80 degrees, PIP extention WNL    Time 8    Period Weeks    Target Date 08/07/20      OT LONG TERM GOAL #3   Title R thumb AROM increase for pt to abd to grasp objects or utenciles of 1-2 inches    Baseline No AROM RA - PA 1/3 range without gravitiy - cannot grip any objects or release - NOW can release - thumb PA but no RA -can do pinching now like fix her hair, pull up pants ,and write some    Status Achieved      OT LONG TERM GOAL #4   Title R wrist AROM increase for pt to maintain wrist with grip to carry 1 lbs objects in ADL's    Baseline wrist drop - keep in 60 degrees flexion - cannot make fist - only when keep wrist with other hand at neutrral   NOW has 20 degrees AROM  from 70 flexion to -50 extention - still collapse into flexion    Time 8    Period Weeks    Status On-going    Target Date 08/07/20      OT LONG TERM GOAL #5   Title Pt R elbow strength increase for pt to pick up or carry more  than 4 lbs - wiht wrist splint if needed    Baseline no strength -just started therapy - radial nerve injury - wrist drop -and elbow AROM -40 and flexion 105   NOW -20 to -25 , flexion 120 - carry 2 lbs    Time 8    Period Weeks    Status On-going    Target Date 08/07/20                   Plan - 07/11/20 1708     Clinical Impression Statement Pt is about 21  wks s/p R supracondylar fx with ORIF and radial N palsy.   Elbow flexion stays about 120 - did initiate using body weight for stretch in past - pt wants to hold off still on  LANTZ splint for pt to use at home for elbow flexion - extention -20  but hold off because of progress. Pt fitted with radial N palsy prefab splint that is light and universal fit - pt fitted this date and able to grip and carry 6 lbs, push and pull door, fold laundry with more ease. Strap on webspace tight and pt ed on wearing splint for short periods and when need for functional use and doing 2 lbs elbow exercises.  Pt able to makde full fist and thumb PA - but not RA yet. Grip with splint on R 17 lbs and prehension 7-8 lbs - same as L hand.  Continue to work towards goals in plan of care to improve ROM, strength and use of RUE In daily tasks.    OT Occupational Profile and History Problem Focused Assessment - Including review of records relating to presenting problem    Occupational performance deficits (Please refer to evaluation for details): ADL's;IADL's;Play;Rest and Sleep;Leisure;Social Participation    Body Structure / Function / Physical Skills ADL;Flexibility;ROM;UE functional use;FMC;Decreased knowledge of use of DME;Dexterity;Sensation;Strength;Pain;IADL    Rehab Potential Good    Clinical Decision Making Limited treatment options, no task modification necessary    Comorbidities Affecting Occupational Performance: None    Modification or Assistance to Complete Evaluation  No modification of tasks or assist necessary to complete eval    OT Frequency  --   3 wks   OT Duration 4 weeks    OT Treatment/Interventions Self-care/ADL training;Moist Heat;Paraffin;DME and/or AE instruction;Manual Therapy;Passive range of motion;Scar mobilization;Splinting;Patient/family education;Therapeutic exercise    Consulted and Agree with Plan of Care Patient             Patient will benefit from skilled therapeutic intervention in order to improve the following deficits and impairments:   Body Structure / Function / Physical Skills: ADL, Flexibility, ROM, UE functional use, FMC, Decreased knowledge of use of DME, Dexterity, Sensation, Strength, Pain, IADL       Visit Diagnosis: Stiffness of right elbow, not elsewhere classified  Stiffness of right wrist, not elsewhere classified  Muscle weakness (generalized)  Stiffness of right hand, not elsewhere classified    Problem List Patient Active Problem List   Diagnosis Date Noted   Right wrist drop 03/25/2020   Psychophysiological insomnia 10/06/2019   Nonintractable headache 10/06/2019   Urinary  incontinence 10/06/2019   Internal and external bleeding hemorrhoids 08/15/2019   Sacral pain    Radicular pain of sacrum 07/11/2019   Sacral fracture, closed ([place]) 07/04/2019   Fall 07/04/2019   Bilateral pubic rami fractures, sequela 07/04/2019   Background diabetic retinopathy ([place]) 01/23/2019   Schatzki's ring 03/04/2017   Post herpetic neuralgia 10/06/2016   Hx of colonic polyps 09/22/2016   Osteopenia 09/02/2016   Anxiety associated with depression 08/25/2016   Trapezius muscle spasm 05/25/2016   Intermittent left lower quadrant abdominal pain 03/16/2016   Anemia in chronic kidney disease (CKD) 02/26/2016   Type 2 diabetes with stage 3 chronic kidney disease GFR 30-59 ([place]) 02/25/2016   Hypothyroidism 02/25/2016   Constipation 02/25/2016   GERD (gastroesophageal reflux disease) 02/25/2016   Hyperlipidemia associated with type 2 diabetes mellitus ([place]) 02/25/2016   DJD (degenerative  joint disease) of cervical spine 02/25/2016   Osteoarthritis of multiple joints 02/25/2016   Hiatal hernia 02/25/2016   CKD stage 3 due to type 2 diabetes mellitus ([place]) 02/25/2016   Chronic kidney disease, unspecified 09/05/2013   Benign hypertension with CKD (chronic kidney disease) stage III ([place]) 09/05/2013   CMC arthritis, thumb, degenerative 06/15/2013   Onychomycosis due to dermatophyte 05/25/2013   Acquired keratoderma 05/25/2013    Rosalyn Gess OTR/L,CLT 07/11/2020, 5:14 PM  [facility] [address], [place], [address] Phone: [phone]   Fax:  [phone]  Name: FAIZAH KANDLER MRN: [id number] Date of Birth: Aug 09, 1929

## 2020-07-16 ENCOUNTER — Other Ambulatory Visit: Payer: Self-pay | Admitting: Family Medicine

## 2020-07-17 NOTE — Telephone Encounter (Signed)
}    Notes to clinic:  medication filled by historical provider  Review for refill    Requested Prescriptions  Pending Prescriptions Disp Refills   omeprazole (PRILOSEC) 20 MG capsule [Pharmacy Med Name: OMEPRAZOLE DR 20 MG CAP] 90 capsule     Sig: TAKE 1 CAPSULE BY MOUTH ONCE DAILY      Gastroenterology: Proton Pump Inhibitors Passed - 07/16/2020  7:07 PM      Passed - Valid encounter within last 12 months    Recent Outpatient Visits           2 weeks ago Type 2 diabetes mellitus with stage 3a chronic kidney disease, without long-term current use of insulin ([place])   [place], DO   3 months ago Type 2 diabetes mellitus with stage 4 chronic kidney disease, without long-term current use of insulin ([place])   [place], DO   6 months ago Recurrent falls   [place], DO   9 months ago Psychophysiological insomnia   [facility] Olin Hauser, DO   9 months ago Psychophysiological insomnia   [facility], Lupita Raider, FNP       Future Appointments             In 3 months [place], Devonne Doughty, [facility], [name]   In 4 months MacDiarmid, Nicki Reaper, MD [place]   In 4 months  [facility], [facility]

## 2020-07-24 ENCOUNTER — Ambulatory Visit (INDEPENDENT_AMBULATORY_CARE_PROVIDER_SITE_OTHER): Payer: PPO | Admitting: Licensed Clinical Social Worker

## 2020-07-24 DIAGNOSIS — E1122 Type 2 diabetes mellitus with diabetic chronic kidney disease: Secondary | ICD-10-CM

## 2020-07-24 DIAGNOSIS — R29898 Other symptoms and signs involving the musculoskeletal system: Secondary | ICD-10-CM

## 2020-07-24 DIAGNOSIS — N1831 Chronic kidney disease, stage 3a: Secondary | ICD-10-CM

## 2020-07-24 DIAGNOSIS — I10 Essential (primary) hypertension: Secondary | ICD-10-CM

## 2020-07-24 DIAGNOSIS — F418 Other specified anxiety disorders: Secondary | ICD-10-CM

## 2020-07-25 ENCOUNTER — Ambulatory Visit: Payer: Self-pay | Admitting: General Practice

## 2020-07-25 ENCOUNTER — Telehealth: Payer: Self-pay | Admitting: Family Medicine

## 2020-07-25 ENCOUNTER — Telehealth: Payer: PPO | Admitting: General Practice

## 2020-07-25 DIAGNOSIS — E1122 Type 2 diabetes mellitus with diabetic chronic kidney disease: Secondary | ICD-10-CM | POA: Diagnosis not present

## 2020-07-25 DIAGNOSIS — F418 Other specified anxiety disorders: Secondary | ICD-10-CM | POA: Diagnosis not present

## 2020-07-25 DIAGNOSIS — E785 Hyperlipidemia, unspecified: Secondary | ICD-10-CM

## 2020-07-25 DIAGNOSIS — G5631 Lesion of radial nerve, right upper limb: Secondary | ICD-10-CM

## 2020-07-25 DIAGNOSIS — E1169 Type 2 diabetes mellitus with other specified complication: Secondary | ICD-10-CM

## 2020-07-25 DIAGNOSIS — R197 Diarrhea, unspecified: Secondary | ICD-10-CM

## 2020-07-25 DIAGNOSIS — M21331 Wrist drop, right wrist: Secondary | ICD-10-CM

## 2020-07-25 DIAGNOSIS — N1831 Chronic kidney disease, stage 3a: Secondary | ICD-10-CM | POA: Diagnosis not present

## 2020-07-25 DIAGNOSIS — R296 Repeated falls: Secondary | ICD-10-CM

## 2020-07-25 DIAGNOSIS — W19XXXD Unspecified fall, subsequent encounter: Secondary | ICD-10-CM

## 2020-07-25 DIAGNOSIS — I10 Essential (primary) hypertension: Secondary | ICD-10-CM | POA: Diagnosis not present

## 2020-07-25 DIAGNOSIS — K5903 Drug induced constipation: Secondary | ICD-10-CM

## 2020-07-25 NOTE — Telephone Encounter (Signed)
Patient came in to office earlier this week to give [place] request Nerve Conduction Study (NCS) study done locally in [place] at Neurology.  We tried to advise her that only Neurology is [facility]. She was anticipating [place].  Her Orthopedic Dr Marcelino Scot ordered the test in [place], but she needs [place] location.  I have placed referral to [facility] for Nerve Conduction Study.  Please let her know to expect apt from them for this procedure and that they are the only ones locally that can do it.  Nobie Putnam, [facility] 07/25/2020, 1:57 PM

## 2020-07-25 NOTE — Patient Instructions (Signed)
Visit Information   Goals Addressed             This Visit's Progress    SW-Manage My Emotions   On track    Timeframe:  Long-Range Goal Priority:  Medium Start Date:  02/01/20                         Expected End Date:  08/31//22                    Follow Up Date - 07/24/20   Patient Goals for the next 120 days: Continue adherence to treatment plan Schedule follow up appointment with PCP Continue utilizing support system to assist with management of symptoms      SW-Matintain My Quality of Life   On track    Timeframe:  Long-Range Goal Priority:  Medium Start Date:  04/10/20                  Expected End Date: 09/18/20                  Follow Up Date - 07/24/20   Patient Goals for the next 120 days: Continue adherence to treatment plan Schedule follow up appointment with PCP Continue utilizing support system to assist with management of symptoms         Patient verbalizes understanding of instructions provided today and agrees to view in [place].   Telephone follow up appointment with care management team member scheduled for:10/01/20  Christa See, MSW, [email or web address] Phone [phone] 9:26 AM

## 2020-07-25 NOTE — Chronic Care Management (AMB) (Signed)
Chronic Care Management   CCM RN Visit Note  07/25/2020 Name: Lisa Mills MRN: [id number] DOB: 30-Nov-1929  Subjective: Lisa Mills is a 87 y.o. year old female who is a primary care patient of Olin Hauser, DO. The care management team was consulted for assistance with disease management and care coordination needs.    Engaged with patient by telephone for follow up visit in response to provider referral for case management and/or care coordination services.   Consent to Services:  The patient was given information about Chronic Care Management services, agreed to services, and gave verbal consent prior to initiation of services.  Please see initial visit note for detailed documentation.   Patient agreed to services and verbal consent obtained.   Assessment: Review of patient past medical history, allergies, medications, health status, including review of consultants reports, laboratory and other test data, was performed as part of comprehensive evaluation and provision of chronic care management services.   SDOH (Social Determinants of Health) assessments and interventions performed:    CCM Care Plan  Allergies  Allergen Reactions   Aspirin Other (See Comments)    Burns stomach   Conray [Iothalamate] Hives    IV dye conray-400   Dye Fdc Red [Red Dye] Hives   Sulfa Antibiotics     Unknown Reaction, not used in years   Prednisone     Indigestion     Outpatient Encounter Medications as of 07/25/2020  Medication Sig   acetaminophen (TYLENOL) 500 MG tablet Take 1 tablet (500 mg total) by mouth every 12 (twelve) hours.   Blood Glucose Monitoring Suppl ([place]) w/Device KIT Use glucometer to check blood sugar daily.   Cholecalciferol (VITAMIN D) 50 MCG (2000 UT) CAPS Take 2,000 Units by mouth daily.    escitalopram (LEXAPRO) 5 MG tablet TAKE 1 TABLET BY MOUTH ONCE DAILY WITH FOOD   ferrous sulfate 325 (65 FE) MG tablet Take 325 mg by mouth daily.     glucose blood (ONE TOUCH ULTRA TEST) test strip CHECK BLOOD SUGAR UP TO 2 TIMES A DAY.   glycerin adult 2 g suppository Place 1 suppository rectally as needed for constipation.   Histamine Dihydrochloride (AUSTRALIAN DREAM ARTHRITIS) 0.025 % CREA Apply 1 application topically daily as needed (Pain).   hydrocortisone (ANUSOL-HC) 2.5 % rectal cream USE 1 APPLICATION RECTALLY TWICE DAILY AS DIRECTED (Patient taking differently: Place 1 application rectally daily as needed for hemorrhoids or anal itching.)   levothyroxine (SYNTHROID) 25 MCG tablet TAKE 1 TABLET BY MOUTH ONCE DAILY ON AN EMPTY STOMACH. WAIT 30 MINUTES BEFORE TAKING OTHER MEDS.   lisinopril (ZESTRIL) 5 MG tablet TAKE 1 TABLET BY MOUTH ONCE DAILY   nitrofurantoin (MACRODANTIN) 100 MG capsule Take 1 capsule (100 mg total) by mouth daily.   omeprazole (PRILOSEC) 20 MG capsule TAKE 1 CAPSULE BY MOUTH ONCE DAILY   OneTouch Delica Lancets 81W MISC 1 each by Does not apply route 3 (three) times daily. Dx:E11.9   polyethylene glycol (MIRALAX / GLYCOLAX) 17 g packet Take 17 g by mouth daily as needed for moderate constipation or severe constipation. (Patient taking differently: Take 17 g by mouth daily.)   simvastatin (ZOCOR) 40 MG tablet Take 1 tablet (40 mg total) by mouth at bedtime.   traZODone (DESYREL) 50 MG tablet Take 0.5-1 tablets (25-50 mg total) by mouth at bedtime as needed for sleep. (Patient taking differently: Take 25 mg by mouth at bedtime as needed for sleep.)   witch hazel-glycerin (  TUCKS) pad Apply 1 application topically as needed for itching.   No facility-administered encounter medications on file as of 07/25/2020.    Patient Active Problem List   Diagnosis Date Noted   Right wrist drop 03/25/2020   Psychophysiological insomnia 10/06/2019   Nonintractable headache 10/06/2019   Urinary incontinence 10/06/2019   Internal and external bleeding hemorrhoids 08/15/2019   Sacral pain    Radicular pain of sacrum 07/11/2019    Sacral fracture, closed ([place]) 07/04/2019   Fall 07/04/2019   Bilateral pubic rami fractures, sequela 07/04/2019   Background diabetic retinopathy ([place]) 01/23/2019   Schatzki's ring 03/04/2017   Post herpetic neuralgia 10/06/2016   Hx of colonic polyps 09/22/2016   Osteopenia 09/02/2016   Anxiety associated with depression 08/25/2016   Trapezius muscle spasm 05/25/2016   Intermittent left lower quadrant abdominal pain 03/16/2016   Anemia in chronic kidney disease (CKD) 02/26/2016   Type 2 diabetes with stage 3 chronic kidney disease GFR 30-59 ([place]) 02/25/2016   Hypothyroidism 02/25/2016   Constipation 02/25/2016   GERD (gastroesophageal reflux disease) 02/25/2016   Hyperlipidemia associated with type 2 diabetes mellitus ([place]) 02/25/2016   DJD (degenerative joint disease) of cervical spine 02/25/2016   Osteoarthritis of multiple joints 02/25/2016   Hiatal hernia 02/25/2016   CKD stage 3 due to type 2 diabetes mellitus ([place]) 02/25/2016   Chronic kidney disease, unspecified 09/05/2013   Benign hypertension with CKD (chronic kidney disease) stage III ([place]) 09/05/2013   CMC arthritis, thumb, degenerative 06/15/2013   Onychomycosis due to dermatophyte 05/25/2013   Acquired keratoderma 05/25/2013    Conditions to be addressed/monitored:HTN, HLD, DMII, and fall risk and safety, chronic diarrhea and constipation concerns  Care Plan : RNCM: Fall Risk (Adult)  Updates made by Vanita Ingles since 07/25/2020 12:00 AM     Problem: RNCM: Fall Risk   Priority: High     Long-Range Goal: Absence of Fall and Fall-Related Injury   Start Date: 04/17/2020  Expected End Date: 03/22/2021  This Visit's Progress: On track  Priority: High  Note:   Current Barriers:  Knowledge Deficits related to fall precautions in patient with multiple chronic conditions.  Decreased adherence to prescribed treatment for fall prevention Unable to independently manage falls as evidence of frequent falls in the  home Lacks social connections Does not contact provider office for questions/concerns Chronic Disease Management support and education needs related to falls prevention in patient with impaired gait and mobility  Lacks caregiver support.  Clinical Goal(s):  patient will demonstrate improved adherence to prescribed treatment plan for decreasing falls as evidenced by patient reporting and review of EMR patient will verbalize using fall risk reduction strategies discussed patient will not experience additional falls patient will verbalize understanding of plan for safety in her home and preventing falls patient will work with [facility] and pcp to address needs related to falls prevention and safety in the home patient will attend all scheduled medical appointments: 11-11-2020 at 120 pm Interventions:  Collaboration with Olin Hauser, DO regarding development and update of comprehensive plan of care as evidenced by provider attestation and co-signature Inter-disciplinary care team collaboration (see longitudinal plan of care) Provided written and verbal education re: Potential causes of falls and Fall prevention strategies Reviewed medications and discussed potential side effects of medications such as dizziness and frequent urination. 07-25-2020: The patient is compliant with medications- denies any issues with dizziness, does have to go to the bathroom quickly for bowel movements at times but she is wearing a  pad for protection now. Will continue to monitor.  Assessed for s/s of orthostatic hypotension Assessed for falls since last encounter. 07-25-2020: Denies any new falls. Likely will close this goal at next outreach. Patient has not had new falls >6 months.  Assessed patients knowledge of fall risk prevention secondary to previously provided education. Assessed working status of life alert bracelet and patient adherence Provided patient information for fall alert systems. 07-25-2020: Review  of fall prevention and safety, especially since the patient walks her dog daily and has a history of falls. The patient verbalized that she is being mindful of her surroundings.  Evaluation of current treatment plan related to fall prevention and safety  and patient's adherence to plan as established by provider. 07-25-2020: The patient is improving and more active than usual. She is being independent and told her son not to come everyday that she would call if she needed him. She states she is being mindful of safety and monitoring for changes. Has a routine and this works well for her. Denies any new concerns at this time.  Advised patient to call the office for new concerns, new falls, or questions  Provided education to patient re: fall prevention and safety in the home Provided patient with fall prevention and safety  educational materials related to prevention of falls in the home by my chart platform Reviewed scheduled/upcoming provider appointments including: 11-11-2020 at 1:20 pm Discussed plans with patient for ongoing care management follow up and provided patient with direct contact information for care management team Self-Care Deficits:  Unable to independently manage falls prevention and safety as evidence of fall with fracture to right humerus with repair on 02-13-2020 Lacks social connections Does not contact provider office for questions/concerns Patient Goals:  - Utilize walker and Cane(assistive device) appropriately with all ambulation - De-clutter walkways - Change positions slowly - Wear secure fitting shoes at all times with ambulation - Utilize home lighting for dim lit areas - Demonstrate self and pet awareness at all times - activities of daily living skills assessed - assistive or adaptive device use encouraged - barriers to physical activity or exercise addressed - barriers to physical activity or exercise identified - barriers to safety identified - cognition  assessed - cognitive-stimulating activities promoted - fall prevention plan reviewed and updated - fear of falling, loss of independence and pain acknowledged - medication list reviewed - modification of home and work environment promoted - participation in rehabilitation therapy encouraged Follow Up Plan: Telephone follow up appointment with care management team member scheduled for: 10-01-2020 at 11:45 am Face to face visit with the [facility] at pcp visit on 11-11-2020 at 1:00/1:20 pm    Task: RNCM: Identify and Manage Contributors to Fall Risk Completed 07/25/2020  Outcome: Positive  Note:   Care Management Activities:    - activities of daily living skills assessed - assistive or adaptive device use encouraged - barriers to physical activity or exercise addressed - barriers to physical activity or exercise identified - barriers to safety identified - cognition assessed - cognitive-stimulating activities promoted - fall prevention plan reviewed and updated - fear of falling, loss of independence and pain acknowledged - medication list reviewed - modification of home and work environment promoted - participation in rehabilitation therapy encouraged        Care Plan : RNCM: Hypertension (Adult)  Updates made by Vanita Ingles since 07/25/2020 12:00 AM     Problem: RNCM: Hypertension (Hypertension)   Priority: Medium  Long-Range Goal: RNCM: Hypertension Monitored   Start Date: 04/17/2020  Expected End Date: 03/22/2021  This Visit's Progress: On track  Priority: Medium  Note:   Objective:  Last practice recorded BP readings:  BP Readings from Last 3 Encounters:  07/01/20 (!) 144/67  03/25/20 (!) 174/73  02/13/20 (!) 175/82   Most recent eGFR/CrCl: No results found for: EGFR  No components found for: CRCL Current Barriers:  Knowledge Deficits related to basic understanding of hypertension pathophysiology and self care management Knowledge Deficits related to understanding  of medications prescribed for management of hypertension Limited Social Support Unable to independently manage HTN Lacks social connections Does not contact provider office for questions/concerns Case Manager Clinical Goal(s):  patient will verbalize understanding of plan for hypertension management patient will attend all scheduled medical appointments: 11-11-2020 at 1:20 pm patient will demonstrate improved adherence to prescribed treatment plan for hypertension as evidenced by taking all medications as prescribed, monitoring and recording blood pressure as directed, adhering to low sodium/DASH diet patient will demonstrate improved health management independence as evidenced by checking blood pressure as directed and notifying PCP if SBP>160 or DBP > 90, taking all medications as prescribe, and adhering to a low sodium diet as discussed. patient will verbalize basic understanding of hypertension disease process and self health management plan as evidenced by compliance with medications, compliance with heart healthy/Ada diet, and working with the CCM team to optimize health and well being  Interventions:  Collaboration with Parks Ranger, Devonne Doughty, DO regarding development and update of comprehensive plan of care as evidenced by provider attestation and co-signature Inter-disciplinary care team collaboration (see longitudinal plan of care) Evaluation of current treatment plan related to hypertension self management and patient's adherence to plan as established by provider. Provided education to patient re: stroke prevention, s/s of heart attack and stroke, DASH diet, complications of uncontrolled blood pressure Reviewed medications with patient and discussed importance of compliance Discussed plans with patient for ongoing care management follow up and provided patient with direct contact information for care management team Advised patient, providing education and rationale, to monitor blood  pressure daily and record, calling PCP for findings outside established parameters.  Reviewed scheduled/upcoming provider appointments including: 11-11-2020 at 120 pm Self-Care Activities: - Self administers medications as prescribed Attends all scheduled provider appointments Calls provider office for new concerns, questions, or BP outside discussed parameters Checks BP and records as discussed Follows a low sodium diet/DASH diet Patient Goals: - check blood pressure 3 times per week - choose a place to take my blood pressure (home, clinic or office, retail store) - write blood pressure results in a log or diary - agree on reward when goals are met - agree to work together to make changes - ask questions to understand - have a family meeting to talk about healthy habits - learn about high blood pressure  Follow Up Plan: Telephone follow up appointment with care management team member scheduled for: 10-01-2020 at 11:45 am Face to Face appointment with care management team member scheduled for:  11-11-2020 at 1 pm when she comes to see the pcp for visit     Task: RNCM: Identify and Monitor Blood Pressure Elevation Completed 07/25/2020  Outcome: Positive  Note:   Care Management Activities:    - blood pressure trends reviewed - depression screen reviewed - home or ambulatory blood pressure monitoring encouraged    Notes:     Care Plan : RNCM: Diabetes Type 2 (Adult)  Updates made by Hall Busing,  Nobie Putnam since 07/25/2020 12:00 AM     Problem: RNCM: Glycemic Management (Diabetes, Type 2)   Priority: High     Long-Range Goal: RNCM: Glycemic Management Optimized   Start Date: 04/17/2020  Expected End Date: 07/20/2021  This Visit's Progress: On track  Priority: High  Note:   Objective:  Lab Results  Component Value Date   HGBA1C 8.4 (A) 07/01/2020    Lab Results  Component Value Date   CREATININE 0.93 02/13/2020   CREATININE 0.94 (H) 09/06/2019   CREATININE 1.23 (H) 08/08/2019     No results found for: EGFR Current Barriers:  Knowledge Deficits related to basic Diabetes pathophysiology and self care/management Knowledge Deficits related to medications used for management of diabetes- is not currently taking medications for diabetes  Does not use cbg meter  Limited Social Support Lacks social connections Does not contact provider office for questions/concerns Case Manager Clinical Goal(s):  patient will demonstrate improved adherence to prescribed treatment plan for diabetes self care/management as evidenced by: daily monitoring and recording of CBG  adherence to ADA/ carb modified diet exercise 3/4 days/week adherence to prescribed medication regimen contacting provider for new or worsened symptoms or questions Interventions:  Collaboration with Olin Hauser, DO regarding development and update of comprehensive plan of care as evidenced by provider attestation and co-signature Inter-disciplinary care team collaboration (see longitudinal plan of care) Provided education to patient about basic DM disease process. 07-25-2020: The patient is having a hard time taking blood sugars at times due to her right hand having a "limp wrist", she has a brace ordered and hopes it will help. She has to write with her left hand and its hard for her. She states she is trying to eat better.  Discussed plans with patient for ongoing care management follow up and provided patient with direct contact information for care management team Reviewed scheduled/upcoming provider appointments including: 11-11-2020 at 1:20 pm Review of patient status, including review of consultants reports, relevant laboratory and other test results, and medications completed. Self-Care Activities - Attends all scheduled provider appointments Checks blood sugars as prescribed and utilize hyper and hypoglycemia protocol as needed Adheres to prescribed ADA/carb modified Patient Goals: -barriers to  adherence to treatment plan identified - blood glucose monitoring encouraged - blood glucose readings reviewed - resources required to improve adherence to care identified - self-awareness of signs/symptoms of hypo or hyperglycemia encouraged  Follow Up Plan: Telephone follow up appointment with care management team member scheduled for: 10-01-2020 at 11:45 am  Face to Face appointment with care management team member scheduled for:  11-11-2020 at 1:00/1:20 pm    Task: RNCM: Alleviate Barriers to Glycemic Management Completed 07/25/2020  Outcome: Positive  Note:   Care Management Activities:    - barriers to adherence to treatment plan identified - blood glucose monitoring encouraged - blood glucose readings reviewed - resources required to improve adherence to care identified - self-awareness of signs/symptoms of hypo or hyperglycemia encouraged        Care Plan : RNCM: Management of HLD  Updates made by Vanita Ingles since 07/25/2020 12:00 AM     Problem: RNCM: HLD Management   Priority: Medium     Long-Range Goal: RNCM: HLD Management   Start Date: 04/17/2020  Expected End Date: 06/10/2021  This Visit's Progress: On track  Priority: Medium  Note:   Current Barriers:  Poorly controlled hyperlipidemia, complicated by DM and HTN Current antihyperlipidemic regimen: zocor 40 mg QD Most recent lipid panel:  Component Value Date/Time   CHOL 168 09/06/2019 0808   TRIG 173 (H) 09/06/2019 0808   HDL 66 09/06/2019 0808   CHOLHDL 2.5 09/06/2019 0808   VLDL 26 08/19/2016 0802   LDLCALC 75 09/06/2019 0808   ASCVD risk enhancing conditions: age >51, DM, HTN, CKD Lacks social connections Does not contact provider office for questions/concerns RN Care Manager Clinical Goal(s):  patient will work with Consulting civil engineer, providers, and care team towards execution of optimized self-health management plan patient will verbalize understanding of plan for effective management of HLD   patient will work with [facility] and pcp  to address needs related to management of HLD  patient will attend all scheduled medical appointments: 11-11-2020 at 1:20 pm Interventions: Collaboration with Olin Hauser, DO regarding development and update of comprehensive plan of care as evidenced by provider attestation and co-signature Inter-disciplinary care team collaboration (see longitudinal plan of care) Medication review performed; medication list updated in electronic medical record.  Inter-disciplinary care team collaboration (see longitudinal plan of care) Referred to pharmacy team for assistance with HLD medication management Evaluation of current treatment plan related to HLD  and patient's adherence to plan as established by provider. 07-25-2020: The patient is doing well and denies any new concerns with the management of her HLD. She is eating well and being more independent. The patient has started driving herself to the MD office appointments, the grocery store and to get her hair fixed. She is walking her dog daily and getting exercise. Is remaining safe in her environment.  Advised patient to call the office for changes or questions Provided education to patient re: heart healthy/ADA diet. 07-25-2020: endorses heart healthy/ADA diet Reviewed scheduled/upcoming provider appointments including: 11-11-2020 at 120 pm Discussed plans with patient for ongoing care management follow up and provided patient with direct contact information for care management team Patient Goals/Self-Care Activities: - call for medicine refill 2 or 3 days before it runs out - call if I am sick and can't take my medicine - keep a list of all the medicines I take; vitamins and herbals too - learn to read medicine labels - use a pillbox to sort medicine - use an alarm clock or phone to remind me to take my medicine - change to whole grain breads, cereal, pasta - drink 6 to 8 glasses of water each day -  eat 3 to 5 servings of fruits and vegetables each day - eat 5 or 6 small meals each day - limit fast food meals to no more than 1 per week - manage portion size - prepare main meal at home 3 to 5 days each week - read food labels for fat, fiber, carbohydrates and portion size - be open to making changes - I can manage, know and watch for signs of a heart attack - if I have chest pain, call for help - learn about small changes that will make a big difference - learn my personal risk factors - barriers to meeting goals identified - change-talk evoked - choices provided - collaboration with team encouraged - decision-making supported - difficulty of making life-long changes acknowledged - health risks reviewed - problem-solving facilitated - questions answered - readiness for change evaluated - reassurance provided - self-reflection promoted - self-reliance encouraged - verbalization of feelings encouraged Follow Up Plan: Telephone follow up appointment with care management team member scheduled for: 10-01-2020 at 1145 am Face to Face visit with the patient at pcp visit on 11-11-2020 at 1:00-1:20  pm      Task: RNCM: Mutually Develop and Foster Achievement of Patient Goals Completed 07/25/2020  Outcome: Positive  Note:   Care Management Activities:    - barriers to meeting goals identified - change-talk evoked - choices provided - collaboration with team encouraged - decision-making supported - difficulty of making life-long changes acknowledged - health risks reviewed - problem-solving facilitated - questions answered - readiness for change evaluated - reassurance provided - self-reflection promoted - self-reliance encouraged - verbalization of feelings encouraged         Plan:Telephone follow up appointment with care management team member scheduled for:  10-01-2020 at 1145 am and Face to Face appointment with care management team member scheduled for: 11-11-2020 at   1:00/1:20 pm  Noreene Larsson RN, MSN, [place] [facility] [facility] Mobile: [phone]

## 2020-07-25 NOTE — Chronic Care Management (AMB) (Signed)
Chronic Care Management    Clinical Social Work Note  07/25/2020 Name: Lisa Mills MRN: [id number] DOB: 08-08-1929  Lisa Mills is a 87 y.o. year old female who is a primary care patient of Olin Hauser, DO. The CCM team was consulted to assist the patient with chronic disease management and/or care coordination needs related to: Mental Health Counseling and Resources.   Engaged with patient by telephone for follow up visit in response to provider referral for social work chronic care management and care coordination services.   Consent to Services:  The patient was given information about Chronic Care Management services, agreed to services, and gave verbal consent prior to initiation of services.  Please see initial visit note for detailed documentation.   Patient agreed to services and consent obtained.   Assessment: Patient is engaged in conversation, continues to maintain positive progress with care plan goals. She continues to experience difficulty with feelings of frustration due to limited progress of conditions. Patient is interested in PCP completing an order/referral for an assessment of the radial nerve in elbow. Per patient, her surgeon, Dr. Marcelino Scot has a written a prescription for this test; however, the specialist is out of network and is in [place], [place]. Patient prefers for Dr. Raliegh Ip to complete new prescription or inform patient to contact Dr. Carlean Jews office about referral to a provider within [facility] network. See Care Plan below for interventions and patient self-care actives. Recent life changes [place]: Limited progress of health condition Recommendation: Patient may benefit from, and is in agreement to work with LCSW to address care coordination needs and will continue to work with the clinical team to address health care and disease management related needs.  Follow up Plan: Patient would like continued follow-up from CCM LCSW .  Follow up scheduled in 10/01/20.  Patient will call office if needed prior to next encounter.   SDOH (Social Determinants of Health) assessments and interventions performed:    Advanced Directives Status: Not addressed in this encounter.  CCM Care Plan  Allergies  Allergen Reactions   Aspirin Other (See Comments)    Burns stomach   Conray [Iothalamate] Hives    IV dye conray-400   Dye Fdc Red [Red Dye] Hives   Sulfa Antibiotics     Unknown Reaction, not used in years   Prednisone     Indigestion     Outpatient Encounter Medications as of 07/24/2020  Medication Sig   acetaminophen (TYLENOL) 500 MG tablet Take 1 tablet (500 mg total) by mouth every 12 (twelve) hours.   Blood Glucose Monitoring Suppl ([place]) w/Device KIT Use glucometer to check blood sugar daily.   Cholecalciferol (VITAMIN D) 50 MCG (2000 UT) CAPS Take 2,000 Units by mouth daily.    escitalopram (LEXAPRO) 5 MG tablet TAKE 1 TABLET BY MOUTH ONCE DAILY WITH FOOD   ferrous sulfate 325 (65 FE) MG tablet Take 325 mg by mouth daily.    glucose blood (ONE TOUCH ULTRA TEST) test strip CHECK BLOOD SUGAR UP TO 2 TIMES A DAY.   glycerin adult 2 g suppository Place 1 suppository rectally as needed for constipation.   Histamine Dihydrochloride (AUSTRALIAN DREAM ARTHRITIS) 0.025 % CREA Apply 1 application topically daily as needed (Pain).   hydrocortisone (ANUSOL-HC) 2.5 % rectal cream USE 1 APPLICATION RECTALLY TWICE DAILY AS DIRECTED (Patient taking differently: Place 1 application rectally daily as needed for hemorrhoids or anal itching.)   levothyroxine (SYNTHROID) 25 MCG tablet TAKE 1 TABLET BY  MOUTH ONCE DAILY ON AN EMPTY STOMACH. WAIT 30 MINUTES BEFORE TAKING OTHER MEDS.   lisinopril (ZESTRIL) 5 MG tablet TAKE 1 TABLET BY MOUTH ONCE DAILY   nitrofurantoin (MACRODANTIN) 100 MG capsule Take 1 capsule (100 mg total) by mouth daily.   omeprazole (PRILOSEC) 20 MG capsule TAKE 1 CAPSULE BY MOUTH ONCE DAILY   OneTouch Delica Lancets 75Z MISC 1 each  by Does not apply route 3 (three) times daily. Dx:E11.9   polyethylene glycol (MIRALAX / GLYCOLAX) 17 g packet Take 17 g by mouth daily as needed for moderate constipation or severe constipation. (Patient taking differently: Take 17 g by mouth daily.)   simvastatin (ZOCOR) 40 MG tablet Take 1 tablet (40 mg total) by mouth at bedtime.   traZODone (DESYREL) 50 MG tablet Take 0.5-1 tablets (25-50 mg total) by mouth at bedtime as needed for sleep. (Patient taking differently: Take 25 mg by mouth at bedtime as needed for sleep.)   witch hazel-glycerin (TUCKS) pad Apply 1 application topically as needed for itching.   No facility-administered encounter medications on file as of 07/24/2020.    Patient Active Problem List   Diagnosis Date Noted   Right wrist drop 03/25/2020   Psychophysiological insomnia 10/06/2019   Nonintractable headache 10/06/2019   Urinary incontinence 10/06/2019   Internal and external bleeding hemorrhoids 08/15/2019   Sacral pain    Radicular pain of sacrum 07/11/2019   Sacral fracture, closed ([place]) 07/04/2019   Fall 07/04/2019   Bilateral pubic rami fractures, sequela 07/04/2019   Background diabetic retinopathy ([place]) 01/23/2019   Schatzki's ring 03/04/2017   Post herpetic neuralgia 10/06/2016   Hx of colonic polyps 09/22/2016   Osteopenia 09/02/2016   Anxiety associated with depression 08/25/2016   Trapezius muscle spasm 05/25/2016   Intermittent left lower quadrant abdominal pain 03/16/2016   Anemia in chronic kidney disease (CKD) 02/26/2016   Type 2 diabetes with stage 3 chronic kidney disease GFR 30-59 ([place]) 02/25/2016   Hypothyroidism 02/25/2016   Constipation 02/25/2016   GERD (gastroesophageal reflux disease) 02/25/2016   Hyperlipidemia associated with type 2 diabetes mellitus ([place]) 02/25/2016   DJD (degenerative joint disease) of cervical spine 02/25/2016   Osteoarthritis of multiple joints 02/25/2016   Hiatal hernia 02/25/2016   CKD stage 3 due to type 2  diabetes mellitus ([place]) 02/25/2016   Chronic kidney disease, unspecified 09/05/2013   Benign hypertension with CKD (chronic kidney disease) stage III ([place]) 09/05/2013   CMC arthritis, thumb, degenerative 06/15/2013   Onychomycosis due to dermatophyte 05/25/2013   Acquired keratoderma 05/25/2013    Conditions to be addressed/monitored: Anxiety and Depression  Care Plan : General Social Work (Adult)  Updates made by Rebekah Chesterfield, LCSW since 07/25/2020 12:00 AM     Problem: Coping Skills (General Plan of Care)      Long-Range Goal: Coping Skills Enhanced   Start Date: 02/01/2020  This Visit's Progress: On track  Recent Progress: On track  Priority: Medium  Note:   Timeframe:  Long-Range Goal Priority:  Medium Start Date:  04/10/20                  Expected End Date: 11/18/20                  Follow Up Date - 10/01/20    Current barriers:  Patient unable to consistently perform activities of daily living and needs additional assistance and support in order to meet this unmet need Limited social support, ADL IADL limitations, Mental Health  Concerns , Social Isolation, and Limited access to caregiver Lacks social connections  Clinical Goals: Over the next 120 days, patient will gain additional education on in home support resources within her area  Over the next 120 days, patient will work with SW to address concerns related to anxiety management  Interventions : Assessed needs, level of care concerns, and barriers to care Patient reports feelings of frustration that her wrist/arm has not improved since surgery. Patient has been participating in physical therapy and wearing a specialized brace to assist with strengthening arm. Despite completing exercises (with weights) daily and wearing the brace for majority of the day, patient reports no improvement in her condition Patient reports that she is working on acceptance of her condition, in the event that the mobility in the arm/hand  stays the same CCM LCSW provided support and encouragement. Gratitude thinking strategies were discussed to assist in management of symptoms to promote mood Patient agreed to continue walking dog every morning to promote feelings of productivity and an enhanced mood CCM LCSW reviewed upcoming appointments with patient. She has an upcoming appt with ENT to and physical therapy (07/30/20) Patient is interested in PCP completing an order/referral for an assessment of the radial nerve in elbow. Per patient, her surgeon, Dr. Marcelino Scot has written a prescription for this test; however, the specialist is out of network and is in [place], [place]. Patient prefers that PCP refer her somewhere local and within the [place] Patient agreed to provide a copy of the prescription to the office today. Prefers for Dr. Raliegh Ip to complete new prescription or inform patient to contact Dr. Carlean Jews office about referral to a provider within [facility] network CCM LCSW used empathetic and active and reflective listening, validated patient's feelings/concerns, and provided emotional support. Strategies to assist in coping with emotional regulation was discussed Patient continues to drive independently to local medical or hair appointments. Son assists with transportation needs that are out of [place] or [employer] 1:1 collaboration with PCP regarding development and update of comprehensive plan of care as evidenced by provider attestation and co-signature  Discussed plans with patient for ongoing care management follow up and provided patient with direct contact information for care management team Inter-disciplinary care team collaboration (see longitudinal plan of care) Collaborated with appropriate clinical care team members regarding patient needs  Patient Goals for the next 120 days: Continue adherence to treatment plan Schedule follow up appointment with PCP Continue utilizing support system to assist with management of  symptoms       Christa See, MSW, [email or web address] Phone [phone] 9:20 AM

## 2020-07-25 NOTE — Patient Instructions (Signed)
Visit Information  PATIENT GOALS:  Goals Addressed             This Visit's Progress    RNCM: Management of Chronic Constipation with Diarrhea       Management of Bowel regimen: Diarrhea/Constipation  Patient has been doing better with management of her chronic diarrhea and constipation.  The patient states that she is taking miralax daily and this is keeping her bowel movements soft. The patient is wearing a pad in case she has an accident and making sure she cleans herself good. She feels she is doing well at managing her care.   -patient to maintain independence with bowel habits -remain safe in environment.  -call the office for changes, questions or concerns      RNCM: Prevent Falls and Injury       Follow Up Date 10-01-2020   - add more outdoor lighting - always use handrails on the stairs - always wear low-heeled or flat shoes or slippers with nonskid soles - call the doctor if I am feeling too drowsy - install bathroom grab bars - keep a flashlight by the bed - keep my cell phone with me always - learn how to get back up if I fall - make an emergency alert plan in case I fall - pick up clutter from the floors - use a nonslip pad with throw rugs, or remove them completely - use a cane or walker - use a nightlight in the bathroom - attend therapy    Why is this important?   Most falls happen when it is hard for you to walk safely. Your balance may be off because of an illness. You may have pain in your knees, hip or other joints.  You may be overly tired or taking medicines that make you sleepy. You may not be able to see or hear clearly.  Falls can lead to broken bones, bruises or other injuries.  There are things you can do to help prevent falling.     Notes: had a fall in December and surgery to repair in January. 07-25-2020: No new falls at this time. The patient is being safe. Denies any new issues with safety. Reviewed with the patient and discussed safety and  fall prevention         Patient verbalizes understanding of instructions provided today and agrees to view in [place].   Telephone follow up appointment with care management team member scheduled for: 10-01-2020 at 1145 am Face to Face appointment with care management team member scheduled for: 11-11-2020 at 1:20 pm  Noreene Larsson RN, MSN, [place] Mobile: [phone]

## 2020-07-26 DIAGNOSIS — R42 Dizziness and giddiness: Secondary | ICD-10-CM | POA: Diagnosis not present

## 2020-07-26 DIAGNOSIS — H6123 Impacted cerumen, bilateral: Secondary | ICD-10-CM | POA: Diagnosis not present

## 2020-07-30 ENCOUNTER — Ambulatory Visit: Payer: PPO | Admitting: Occupational Therapy

## 2020-07-30 DIAGNOSIS — E1122 Type 2 diabetes mellitus with diabetic chronic kidney disease: Secondary | ICD-10-CM | POA: Diagnosis not present

## 2020-07-30 DIAGNOSIS — R2689 Other abnormalities of gait and mobility: Secondary | ICD-10-CM | POA: Diagnosis not present

## 2020-07-30 DIAGNOSIS — G5631 Lesion of radial nerve, right upper limb: Secondary | ICD-10-CM | POA: Diagnosis not present

## 2020-07-30 DIAGNOSIS — R200 Anesthesia of skin: Secondary | ICD-10-CM | POA: Diagnosis not present

## 2020-07-30 DIAGNOSIS — N183 Chronic kidney disease, stage 3 unspecified: Secondary | ICD-10-CM | POA: Diagnosis not present

## 2020-07-30 DIAGNOSIS — E039 Hypothyroidism, unspecified: Secondary | ICD-10-CM | POA: Diagnosis not present

## 2020-07-30 DIAGNOSIS — Z79899 Other long term (current) drug therapy: Secondary | ICD-10-CM | POA: Diagnosis not present

## 2020-07-30 DIAGNOSIS — R202 Paresthesia of skin: Secondary | ICD-10-CM | POA: Diagnosis not present

## 2020-08-05 ENCOUNTER — Ambulatory Visit: Payer: PPO | Attending: Family Medicine | Admitting: Occupational Therapy

## 2020-08-05 DIAGNOSIS — M25631 Stiffness of right wrist, not elsewhere classified: Secondary | ICD-10-CM | POA: Diagnosis not present

## 2020-08-05 DIAGNOSIS — M25621 Stiffness of right elbow, not elsewhere classified: Secondary | ICD-10-CM

## 2020-08-05 DIAGNOSIS — M6281 Muscle weakness (generalized): Secondary | ICD-10-CM | POA: Diagnosis not present

## 2020-08-05 DIAGNOSIS — G5631 Lesion of radial nerve, right upper limb: Secondary | ICD-10-CM | POA: Diagnosis not present

## 2020-08-05 DIAGNOSIS — M25641 Stiffness of right hand, not elsewhere classified: Secondary | ICD-10-CM

## 2020-08-05 NOTE — Therapy (Signed)
[facility] [address] [place], [place], [address] Phone: [phone]   Fax:  [phone]  Occupational Therapy Treatment  Patient Details  Name: Lisa Mills MRN: [id number] Date of Birth: Jun 06, 1929 Referring Provider (OT): Dr Marcelino Scot   Encounter Date: 08/05/2020   OT End of Session - 08/05/20 1724     Visit Number 13    Number of Visits 17    Date for OT Re-Evaluation 08/07/20    OT Start Time 1525    OT Stop Time 1600    OT Time Calculation (min) 35 min    Activity Tolerance Patient tolerated treatment well    Behavior During Therapy [facility] for tasks assessed/performed             Past Medical History:  Diagnosis Date   Anemia    Anxiety    Chronic kidney disease    stage 3-4   Constipation    Depression    Dermatophytosis of nail    Diabetes mellitus without complication ([place])    Diet controlled, lost weight, no meds   GERD (gastroesophageal reflux disease)    Hemorrhoids    History of shingles    HLD (hyperlipidemia)    Hypertension    Hypothyroidism    Keratoderma    Restless legs syndrome (RLS)    hx    Past Surgical History:  Procedure Laterality Date   BREAST BIOPSY Right 09/30/2017   Affirm bx-calcs ( X clip),benign   BREAST EXCISIONAL BIOPSY Right    neg   cataracts     COLONOSCOPY W/ POLYPECTOMY     COLONOSCOPY WITH PROPOFOL N/A 02/10/2017   Procedure: COLONOSCOPY WITH PROPOFOL;  Surgeon: Manya Silvas, MD;  [facility] ENDOSCOPY;  Service: Endoscopy;  Laterality: N/A;   ESOPHAGOGASTRODUODENOSCOPY (EGD) WITH PROPOFOL N/A 02/10/2017   Procedure: ESOPHAGOGASTRODUODENOSCOPY (EGD) WITH PROPOFOL;  Surgeon: Manya Silvas, MD;  Location: [facility] ENDOSCOPY;  Service: Endoscopy;  Laterality: N/A;   EYE SURGERY     bilateral cataracts   HAND SURGERY     KIDNEY STONE SURGERY     ORIF HUMERUS FRACTURE Right 02/13/2020   Procedure: OPEN REDUCTION INTERNAL FIXATION (ORIF)  SUPRACONDYLAR  HUMERUS FRACTURE;  Surgeon: Alt[place]e [place], MD;  Location: [place];  Service: Orthopedics;  Laterality: Right;   SACROPLASTY N/A 07/06/2019   Procedure: SACROPLASTY;  Surgeon: Hessie Knows, MD;  Location: [facility] ORS;  Service: Orthopedics;  Laterality: N/A;   TONSILECTOMY, ADENOIDECTOMY, BILATERAL MYRINGOTOMY AND TUBES     TONSILLECTOMY     TRIGGER FINGER RELEASE     Left ring finger   tubercular peritonitis      There were no vitals filed for this visit.   Subjective Assessment - 08/05/20 1536     Subjective  Seen the Neurologist -  I am schduled for next month for nerve test -and my hand about the same - I am wearing the splint for few hours and try to use my hand as much as  Ican    Pertinent History Pt fell and ended up with R supracondylar fx - ORIF done by Dr Marcelino Scot -pt also with radial N palsy - was suppose to have [facility] PT but never got it -and refer now by Dr Marcelino Scot to outpt OT on 04/03/20 - for ROM and possible radial N palsy custom splint    Patient Stated Goals Want to use my R hand to eat, do my hair, makeup, open doors, cook and make  bed as well as drive again    Currently in Pain? No/denies                          Pt arrive with  prefab radial N splint on - straps to loose at radial side - and taps pulling digits - pulling sideways and slipping off on 5th  Strap missing to keep them in place per pt  Elbow flexion increase to 125 , extention same   Pt show less tightness with PROM for wrist and digits extention and thumb extention - but  no AROM yet   Assess fit of prefab radial N palsy splint again - pt able to pull and push door open , fold laundry and carry 6 lbs  Review again fitting of splint and adjusting to not cause deviation on wrist an digits- causing pain Pt to hold off on doing HEP with 2 lbs - causing some pain at shoulder   Pt ed on wearing splint and increase gradually wearing time still- webspace she always have trouble with  redness To use with functional task of hand  Pt wants to hold off still on elbow splint LANTZ  And nerve conduction test schedule for 09/09/20 Pt to follow up afterwards           OT Education - 08/05/20 1724     Education Details progress and splint wearing    Person(s) Educated Patient    Methods Explanation;Demonstration;Tactile cues;Verbal cues;Handout    Comprehension Verbal cues required;Returned demonstration;Verbalized understanding              OT Short Term Goals - 06/12/20 1728       OT SHORT TERM GOAL #1   Title Pt to be independent in HEP to decrease tightness in flexors of R forearm and hand to initiate AROM for wrist and digits extention    Baseline Pt did not had any therapy since surgery - HH could not come - very tight and stiff -    Time 4    Period Weeks    Status Achieved               OT Long Term Goals - 06/12/20 1729       OT LONG TERM GOAL #1   Title R elbow flexion and extention increase with more than 20 degrees to apply deodorant, wash hair and bath chest and neck    Baseline Flexion 105 R elbow, extention -40 -   NOW elbow flexion 128 , extention -20 to -25  wrist extention limiting her functional use    Time 6    Period Weeks    Status On-going    Target Date 07/24/20      OT LONG TERM GOAL #2   Title R wrist extention improve for pt to maintain wrist neutral for to bath herself and wipe counters    Baseline R wrist and digits flexors tight -and keep wrist in 60 degrees flexion - unable to open or extend MC's and wrist- this date able to extend wrist from flexion 20 degrees , MC extention -50 to -80 degrees, PIP extention WNL    Time 8    Period Weeks    Target Date 08/07/20      OT LONG TERM GOAL #3   Title R thumb AROM increase for pt to abd to grasp objects or utenciles of 1-2 inches    Baseline No AROM RA - PA  1/3 range without gravitiy - cannot grip any objects or release - NOW can release - thumb PA but no RA -can do  pinching now like fix her hair, pull up pants ,and write some    Status Achieved      OT LONG TERM GOAL #4   Title R wrist AROM increase for pt to maintain wrist with grip to carry 1 lbs objects in ADL's    Baseline wrist drop - keep in 60 degrees flexion - cannot make fist - only when keep wrist with other hand at neutrral   NOW has 20 degrees AROM from 70 flexion to -50 extention - still collapse into flexion    Time 8    Period Weeks    Status On-going    Target Date 08/07/20      OT LONG TERM GOAL #5   Title Pt R elbow strength increase for pt to pick up or carry more than 4 lbs - wiht wrist splint if needed    Baseline no strength -just started therapy - radial nerve injury - wrist drop -and elbow AROM -40 and flexion 105   NOW -20 to -25 , flexion 120 - carry 2 lbs    Time 8    Period Weeks    Status On-going    Target Date 08/07/20                   Plan - 08/05/20 1725     Clinical Impression Statement Pt is about 6 months s/p R supracondylar fx with ORIF and radial N palsy.   Elbow flexion increase to 125 the last month - did initiate using body weight for stretch in past - pt wants to hold off still on  LANTZ splint for pt to use at home for elbow flexion - extention -20. Pt fitted with radial N palsy prefab splint last month that is light and universal fit - pt fitted  and tolerating well - able to donn and doff -  able to grip and carry 6 lbs, push and pull door, fold laundry with more ease. Strap on webspace still tight and remind again pt ed on wearing splint for short periods and when need for functional use.  Pt able to makde full fist and thumb PA - but not RA yet. Pt PROM for wrist extention and digits not as tight as prior to spint fitting -did add strap to keep dynamic tention and leverage over top of spint for [facility] extention - she lost the strap.Cont to be mostly limited for AROM for wrist ext, digits MC extentionand thumb extention - pt did see neurologist and  scheduled for nerve conduction test for 09/09/20 - pt to call me after appt.   Continue to work towards goals in plan of care to improve ROM, strength and use of RUE In daily tasks.    OT Occupational Profile and History Problem Focused Assessment - Including review of records relating to presenting problem    Occupational performance deficits (Please refer to evaluation for details): ADL's;IADL's;Play;Rest and Sleep;Leisure;Social Participation    Body Structure / Function / Physical Skills ADL;Flexibility;ROM;UE functional use;FMC;Decreased knowledge of use of DME;Dexterity;Sensation;Strength;Pain;IADL    Rehab Potential Good    Clinical Decision Making Limited treatment options, no task modification necessary    Comorbidities Affecting Occupational Performance: None    Modification or Assistance to Complete Evaluation  No modification of tasks or assist necessary to complete eval    OT Frequency Monthly  OT Duration 4 weeks    OT Treatment/Interventions Self-care/ADL training;Moist Heat;Paraffin;DME and/or AE instruction;Manual Therapy;Passive range of motion;Scar mobilization;Splinting;Patient/family education;Therapeutic exercise    Consulted and Agree with Plan of Care Patient             Patient will benefit from skilled therapeutic intervention in order to improve the following deficits and impairments:   Body Structure / Function / Physical Skills: ADL, Flexibility, ROM, UE functional use, FMC, Decreased knowledge of use of DME, Dexterity, Sensation, Strength, Pain, IADL       Visit Diagnosis: Stiffness of right elbow, not elsewhere classified  Stiffness of right wrist, not elsewhere classified  Muscle weakness (generalized)  Stiffness of right hand, not elsewhere classified  Compression of right radial nerve    Problem List Patient Active Problem List   Diagnosis Date Noted   Right wrist drop 03/25/2020   Psychophysiological insomnia 10/06/2019   Nonintractable  headache 10/06/2019   Urinary incontinence 10/06/2019   Internal and external bleeding hemorrhoids 08/15/2019   Sacral pain    Radicular pain of sacrum 07/11/2019   Sacral fracture, closed ([place]) 07/04/2019   Fall 07/04/2019   Bilateral pubic rami fractures, sequela 07/04/2019   Background diabetic retinopathy ([place]) 01/23/2019   Schatzki's ring 03/04/2017   Post herpetic neuralgia 10/06/2016   Hx of colonic polyps 09/22/2016   Osteopenia 09/02/2016   Anxiety associated with depression 08/25/2016   Trapezius muscle spasm 05/25/2016   Intermittent left lower quadrant abdominal pain 03/16/2016   Anemia in chronic kidney disease (CKD) 02/26/2016   Type 2 diabetes with stage 3 chronic kidney disease GFR 30-59 ([place]) 02/25/2016   Hypothyroidism 02/25/2016   Constipation 02/25/2016   GERD (gastroesophageal reflux disease) 02/25/2016   Hyperlipidemia associated with type 2 diabetes mellitus ([place]) 02/25/2016   DJD (degenerative joint disease) of cervical spine 02/25/2016   Osteoarthritis of multiple joints 02/25/2016   Hiatal hernia 02/25/2016   CKD stage 3 due to type 2 diabetes mellitus ([place]) 02/25/2016   Chronic kidney disease, unspecified 09/05/2013   Benign hypertension with CKD (chronic kidney disease) stage III ([place]) 09/05/2013   CMC arthritis, thumb, degenerative 06/15/2013   Onychomycosis due to dermatophyte 05/25/2013   Acquired keratoderma 05/25/2013    Rosalyn Gess OTR/L,CLT 08/05/2020, 5:31 PM  [facility] [address], [place], [address] Phone: [phone]   Fax:  [phone]  Name: Lisa Mills MRN: [id number] Date of Birth: Nov 19, 1929

## 2020-08-15 ENCOUNTER — Telehealth: Payer: Self-pay

## 2020-08-15 ENCOUNTER — Other Ambulatory Visit: Payer: Self-pay | Admitting: Family Medicine

## 2020-08-15 DIAGNOSIS — E538 Deficiency of other specified B group vitamins: Secondary | ICD-10-CM

## 2020-08-15 DIAGNOSIS — E038 Other specified hypothyroidism: Secondary | ICD-10-CM

## 2020-08-15 MED ORDER — CYANOCOBALAMIN 1000 MCG/ML IJ SOLN: 1000.0000 ug | INTRAMUSCULAR | 0 refills | Status: DC

## 2020-08-15 NOTE — Telephone Encounter (Signed)
Pt wanted a call back regarding getting B12 shots, pt stated she went to [facility] and was informed she needs a series of B12 injections.  She had these levels checked at [facility] with Neurology (Dr. Manuella Ghazi)

## 2020-08-15 NOTE — Telephone Encounter (Signed)
Last lab Vitamin B12 result 144, it is low - this was 07/2020 by [facility].  I have sent new rx Vitamin B12 injection to her [place].  Please let her know  She can pick up 4 doses. 1 bottle = 1 dose, 1053mcg for 1 mL. She can bring them one at a time to our office for a Nurse Visit for B12 injection, ONCE WEEKLY for 4 weeks or 1 month. Then she will need a new order for a MONTHLY injection and we can re-check her levels anytime after 4-6 weeks.  Nobie Putnam, [facility] 08/15/2020, 11:19 AM

## 2020-08-19 DIAGNOSIS — I10 Essential (primary) hypertension: Secondary | ICD-10-CM | POA: Diagnosis not present

## 2020-08-19 DIAGNOSIS — E1122 Type 2 diabetes mellitus with diabetic chronic kidney disease: Secondary | ICD-10-CM | POA: Diagnosis not present

## 2020-08-19 DIAGNOSIS — N1832 Chronic kidney disease, stage 3b: Secondary | ICD-10-CM | POA: Diagnosis not present

## 2020-08-23 ENCOUNTER — Ambulatory Visit (INDEPENDENT_AMBULATORY_CARE_PROVIDER_SITE_OTHER): Payer: PPO | Admitting: Family Medicine

## 2020-08-23 DIAGNOSIS — E538 Deficiency of other specified B group vitamins: Secondary | ICD-10-CM | POA: Diagnosis not present

## 2020-08-23 MED ORDER — CYANOCOBALAMIN 1000 MCG/ML IJ SOLN
1000.0000 ug | Freq: Once | INTRAMUSCULAR | Status: AC
  Administered 2020-08-23: 1000 ug via INTRAMUSCULAR

## 2020-08-26 ENCOUNTER — Other Ambulatory Visit: Payer: Self-pay | Admitting: Family Medicine

## 2020-08-26 DIAGNOSIS — E1122 Type 2 diabetes mellitus with diabetic chronic kidney disease: Secondary | ICD-10-CM

## 2020-08-26 NOTE — Telephone Encounter (Signed)
Requested medication (s) are due for refill today: yes   Requested medication (s) are on the active medication list: yes   Last refill:  05/30/2020  Future visit scheduled: Yes  Notes to clinic:  Failed protocol:   Cr in normal range and within 180 days   K in normal range and within 180 days   Last BP in normal range     Requested Prescriptions  Pending Prescriptions Disp Refills   lisinopril (ZESTRIL) 5 MG tablet [Pharmacy Med Name: LISINOPRIL 5 MG TAB] 90 tablet 0    Sig: TAKE 1 TABLET BY MOUTH ONCE DAILY      Cardiovascular:  ACE Inhibitors Failed - 08/26/2020  9:37 AM      Failed - Cr in normal range and within 180 days    Creat  Date Value Ref Range Status  09/06/2019 0.94 (H) 0.60 - 0.88 mg/dL Final    Comment:    For patients >78 years of age, the reference limit for Creatinine is approximately 13% higher for people identified as African-American. .    Creatinine, Ser  Date Value Ref Range Status  02/13/2020 0.93 0.44 - 1.00 mg/dL Final          Failed - K in normal range and within 180 days    Potassium  Date Value Ref Range Status  02/13/2020 4.0 3.5 - 5.1 mmol/L Final  06/28/2012 4.2 3.5 - 5.1 mmol/L Final          Failed - Last BP in normal range    BP Readings from Last 1 Encounters:  07/01/20 (!) 144/67          Passed - Patient is not pregnant      Passed - Valid encounter within last 6 months    Recent Outpatient Visits           3 days ago Vitamin B12 deficiency   [facility] [place], Devonne Doughty, DO   1 month ago Type 2 diabetes mellitus with stage 3a chronic kidney disease, without long-term current use of insulin ([place])   [place], DO   5 months ago Type 2 diabetes mellitus with stage 4 chronic kidney disease, without long-term current use of insulin ([place])   [place], DO   7 months ago Recurrent falls   [place], DO   10 months ago Psychophysiological insomnia   [place], DO       Future Appointments             In 2 months [place], Devonne Doughty, DO [facility], [place]   In 3 months MacDiarmid, Nicki Reaper, MD [place]   In 3 months  [facility], [facility]

## 2020-08-30 ENCOUNTER — Ambulatory Visit (INDEPENDENT_AMBULATORY_CARE_PROVIDER_SITE_OTHER): Payer: PPO | Admitting: Family Medicine

## 2020-08-30 ENCOUNTER — Other Ambulatory Visit: Payer: Self-pay

## 2020-08-30 VITALS — BP 123/72 | HR 76 | Ht 59.0 in | Wt 106.0 lb

## 2020-08-30 DIAGNOSIS — E538 Deficiency of other specified B group vitamins: Secondary | ICD-10-CM

## 2020-08-30 MED ORDER — CYANOCOBALAMIN 1000 MCG/ML IJ SOLN
1000.0000 ug | Freq: Once | INTRAMUSCULAR | Status: AC
  Administered 2020-08-30: 1000 ug via INTRAMUSCULAR

## 2020-09-02 ENCOUNTER — Encounter: Payer: Self-pay | Admitting: Family Medicine

## 2020-09-02 NOTE — Progress Notes (Deleted)
Patient here for B12 injection

## 2020-09-03 ENCOUNTER — Other Ambulatory Visit: Payer: Self-pay | Admitting: Family Medicine

## 2020-09-03 DIAGNOSIS — F418 Other specified anxiety disorders: Secondary | ICD-10-CM

## 2020-09-06 ENCOUNTER — Other Ambulatory Visit: Payer: Self-pay

## 2020-09-06 ENCOUNTER — Other Ambulatory Visit: Payer: Self-pay | Admitting: Family Medicine

## 2020-09-06 ENCOUNTER — Ambulatory Visit (INDEPENDENT_AMBULATORY_CARE_PROVIDER_SITE_OTHER): Payer: PPO | Admitting: Family Medicine

## 2020-09-06 DIAGNOSIS — E538 Deficiency of other specified B group vitamins: Secondary | ICD-10-CM

## 2020-09-06 MED ORDER — CYANOCOBALAMIN 1000 MCG/ML IJ SOLN
1000.0000 ug | Freq: Once | INTRAMUSCULAR | Status: AC
  Administered 2020-09-06: 1000 ug via INTRAMUSCULAR

## 2020-09-06 MED ORDER — CYANOCOBALAMIN 1000 MCG/ML IJ SOLN: 1000.0000 ug | INTRAMUSCULAR | 2 refills | Status: DC

## 2020-09-09 DIAGNOSIS — R200 Anesthesia of skin: Secondary | ICD-10-CM | POA: Diagnosis not present

## 2020-09-09 DIAGNOSIS — R202 Paresthesia of skin: Secondary | ICD-10-CM | POA: Diagnosis not present

## 2020-09-26 ENCOUNTER — Ambulatory Visit (INDEPENDENT_AMBULATORY_CARE_PROVIDER_SITE_OTHER): Payer: PPO

## 2020-09-26 ENCOUNTER — Telehealth: Payer: PPO | Admitting: General Practice

## 2020-09-26 DIAGNOSIS — E1122 Type 2 diabetes mellitus with diabetic chronic kidney disease: Secondary | ICD-10-CM

## 2020-09-26 DIAGNOSIS — E1169 Type 2 diabetes mellitus with other specified complication: Secondary | ICD-10-CM

## 2020-09-26 DIAGNOSIS — I10 Essential (primary) hypertension: Secondary | ICD-10-CM

## 2020-09-26 DIAGNOSIS — R296 Repeated falls: Secondary | ICD-10-CM

## 2020-09-26 NOTE — Patient Instructions (Signed)
Visit Information  PATIENT GOALS:  Goals Addressed             This Visit's Progress    RNCM: Management of Chronic Constipation with Diarrhea       Management of Bowel regimen: Diarrhea/Constipation  Patient has been doing better with management of her chronic diarrhea and constipation.  The patient states that she is taking miralax daily and this is keeping her bowel movements soft. The patient is wearing a pad in case she has an accident and making sure she cleans herself good. She feels she is doing well at managing her care.   -patient to maintain independence with bowel habits -remain safe in environment.  -call the office for changes, questions or concerns  09-26-2020: The patient is doing well at managing her care at this time. No new concerns with bowel health.     RNCM: Prevent Falls and Injury       Follow Up Date 11-11-2020   - add more outdoor lighting - always use handrails on the stairs - always wear low-heeled or flat shoes or slippers with nonskid soles - call the doctor if I am feeling too drowsy - install bathroom grab bars - keep a flashlight by the bed - keep my cell phone with me always - learn how to get back up if I fall - make an emergency alert plan in case I fall - pick up clutter from the floors - use a nonslip pad with throw rugs, or remove them completely - use a cane or walker - use a nightlight in the bathroom - attend therapy    Why is this important?   Most falls happen when it is hard for you to walk safely. Your balance may be off because of an illness. You may have pain in your knees, hip or other joints.  You may be overly tired or taking medicines that make you sleepy. You may not be able to see or hear clearly.  Falls can lead to broken bones, bruises or other injuries.  There are things you can do to help prevent falling.     Notes: had a fall in December and surgery to repair in January. 07-25-2020: No new falls at this time. The  patient is being safe. Denies any new issues with safety. Reviewed with the patient and discussed safety and fall prevention. 09-26-2020: Denies any new falls. Is getting slower but happy that she can take care of herself and her dog. Will continue to monitor.         Patient verbalizes understanding of instructions provided today and agrees to view in [place].   Face to Face appointment with care management team member scheduled for: 11-11-2020 at 1/120 pm  Noreene Larsson RN, MSN, [place] Mobile: [phone]

## 2020-09-26 NOTE — Chronic Care Management (AMB) (Signed)
Chronic Care Management   CCM RN Visit Note  09/26/2020 Name: Lisa Mills MRN: [id number] DOB: [birth year]  Subjective: Lisa Mills is a 87 y.o. year old female who is a primary care patient of Olin Hauser, DO. The care management team was consulted for assistance with disease management and care coordination needs.    Engaged with patient by telephone for follow up visit in response to provider referral for case management and/or care coordination services.   Consent to Services:  The patient was given information about Chronic Care Management services, agreed to services, and gave verbal consent prior to initiation of services.  Please see initial visit note for detailed documentation.   Patient agreed to services and verbal consent obtained.   Assessment: Review of patient past medical history, allergies, medications, health status, including review of consultants reports, laboratory and other test data, was performed as part of comprehensive evaluation and provision of chronic care management services.   SDOH (Social Determinants of Health) assessments and interventions performed:  SDOH Interventions    Flowsheet Row Most Recent Value  SDOH Interventions   Physical Activity Interventions Other (Comments)  [limited mobility and high fall risk]  Social Connections Interventions Other (Comment)  [has family that checks on her. Independent]        CCM Care Plan  Allergies  Allergen Reactions   Aspirin Other (See Comments)    Burns stomach   Conray [Iothalamate] Hives    IV dye conray-400   Dye Fdc Red [Red Dye] Hives   Sulfa Antibiotics     Unknown Reaction, not used in years   Prednisone     Indigestion     Outpatient Encounter Medications as of 09/26/2020  Medication Sig   acetaminophen (TYLENOL) 500 MG tablet Take 1 tablet (500 mg total) by mouth every 12 (twelve) hours.   Blood Glucose Monitoring Suppl ([place]) w/Device KIT Use  glucometer to check blood sugar daily.   Cholecalciferol (VITAMIN D) 50 MCG (2000 UT) CAPS Take 2,000 Units by mouth daily.    cyanocobalamin (,VITAMIN B-12,) 1000 MCG/ML injection Inject 1 mL (1,000 mcg total) into the muscle every 30 (thirty) days. For 3 months, then need repeat B12 lab level   escitalopram (LEXAPRO) 5 MG tablet TAKE 1 TABLET BY MOUTH ONCE DAILY WITH FOOD   ferrous sulfate 325 (65 FE) MG tablet Take 325 mg by mouth daily.    glucose blood (ONE TOUCH ULTRA TEST) test strip CHECK BLOOD SUGAR UP TO 2 TIMES A DAY.   glycerin adult 2 g suppository Place 1 suppository rectally as needed for constipation.   Histamine Dihydrochloride (AUSTRALIAN DREAM ARTHRITIS) 0.025 % CREA Apply 1 application topically daily as needed (Pain).   hydrocortisone (ANUSOL-HC) 2.5 % rectal cream USE 1 APPLICATION RECTALLY TWICE DAILY AS DIRECTED (Patient taking differently: Place 1 application rectally daily as needed for hemorrhoids or anal itching.)   levothyroxine (SYNTHROID) 25 MCG tablet TAKE 1 TABLET BY MOUTH ONCE DAILY ON AN EMPTY STOMACH. WAIT 30 MINUTES BEFORE TAKING OTHER MEDS.   lisinopril (ZESTRIL) 5 MG tablet TAKE 1 TABLET BY MOUTH ONCE DAILY   nitrofurantoin (MACRODANTIN) 100 MG capsule Take 1 capsule (100 mg total) by mouth daily.   omeprazole (PRILOSEC) 20 MG capsule TAKE 1 CAPSULE BY MOUTH ONCE DAILY   OneTouch Delica Lancets 78G MISC 1 each by Does not apply route 3 (three) times daily. Dx:E11.9   polyethylene glycol (MIRALAX / GLYCOLAX) 17 g packet Take 17 g  by mouth daily as needed for moderate constipation or severe constipation. (Patient taking differently: Take 17 g by mouth daily.)   simvastatin (ZOCOR) 40 MG tablet Take 1 tablet (40 mg total) by mouth at bedtime.   traZODone (DESYREL) 50 MG tablet Take 0.5-1 tablets (25-50 mg total) by mouth at bedtime as needed for sleep. (Patient taking differently: Take 25 mg by mouth at bedtime as needed for sleep.)   witch hazel-glycerin (TUCKS)  pad Apply 1 application topically as needed for itching.   No facility-administered encounter medications on file as of 09/26/2020.    Patient Active Problem List   Diagnosis Date Noted   Right wrist drop 03/25/2020   Psychophysiological insomnia 10/06/2019   Nonintractable headache 10/06/2019   Urinary incontinence 10/06/2019   Internal and external bleeding hemorrhoids 08/15/2019   Sacral pain    Radicular pain of sacrum 07/11/2019   Sacral fracture, closed ([place]) 07/04/2019   Fall 07/04/2019   Bilateral pubic rami fractures, sequela 07/04/2019   Background diabetic retinopathy ([place]) 01/23/2019   Schatzki's ring 03/04/2017   Post herpetic neuralgia 10/06/2016   Hx of colonic polyps 09/22/2016   Osteopenia 09/02/2016   Anxiety associated with depression 08/25/2016   Trapezius muscle spasm 05/25/2016   Intermittent left lower quadrant abdominal pain 03/16/2016   Anemia in chronic kidney disease (CKD) 02/26/2016   Type 2 diabetes with stage 3 chronic kidney disease GFR 30-59 ([place]) 02/25/2016   Hypothyroidism 02/25/2016   Constipation 02/25/2016   GERD (gastroesophageal reflux disease) 02/25/2016   Hyperlipidemia associated with type 2 diabetes mellitus ([place]) 02/25/2016   DJD (degenerative joint disease) of cervical spine 02/25/2016   Osteoarthritis of multiple joints 02/25/2016   Hiatal hernia 02/25/2016   CKD stage 3 due to type 2 diabetes mellitus ([place]) 02/25/2016   Chronic kidney disease, unspecified 09/05/2013   Benign hypertension with CKD (chronic kidney disease) stage III ([place]) 09/05/2013   CMC arthritis, thumb, degenerative 06/15/2013   Onychomycosis due to dermatophyte 05/25/2013   Acquired keratoderma 05/25/2013    Conditions to be addressed/monitored:HTN, HLD, and DMII  Care Plan : RNCM: Fall Risk (Adult)  Updates made by Vanita Ingles, RN since 09/26/2020 12:00 AM     Problem: RNCM: Fall Risk   Priority: High     Long-Range Goal: Absence of Fall and  Fall-Related Injury   Start Date: 04/17/2020  Expected End Date: 03/22/2021  This Visit's Progress: On track  Recent Progress: On track  Priority: High  Note:   Current Barriers:  Knowledge Deficits related to fall precautions in patient with multiple chronic conditions.  Decreased adherence to prescribed treatment for fall prevention Unable to independently manage falls as evidence of frequent falls in the home Lacks social connections Does not contact provider office for questions/concerns Chronic Disease Management support and education needs related to falls prevention in patient with impaired gait and mobility  Lacks caregiver support.  Clinical Goal(s):  patient will demonstrate improved adherence to prescribed treatment plan for decreasing falls as evidenced by patient reporting and review of EMR patient will verbalize using fall risk reduction strategies discussed patient will not experience additional falls patient will verbalize understanding of plan for safety in her home and preventing falls patient will work with [facility] and pcp to address needs related to falls prevention and safety in the home patient will attend all scheduled medical appointments: 11-11-2020 at 120 pm Interventions:  Collaboration with Olin Hauser, DO regarding development and update of comprehensive plan of care as evidenced  by provider attestation and co-signature Inter-disciplinary care team collaboration (see longitudinal plan of care) Provided written and verbal education re: Potential causes of falls and Fall prevention strategies Reviewed medications and discussed potential side effects of medications such as dizziness and frequent urination. 09-26-2020: The patient is compliant with medications- denies any issues with dizziness, does have to go to the bathroom quickly for bowel movements at times but she is wearing a pad for protection now. Will continue to monitor.  Assessed for s/s of  orthostatic hypotension Assessed for falls since last encounter. 07-25-2020: Denies any new falls. Likely will close this goal at next outreach. Patient has not had new falls >6 months. 09-26-2020: Denies any new falls. Is high fall risk. Will continue to monitor during outreaches. Education and support given.  Assessed patients knowledge of fall risk prevention secondary to previously provided education. Assessed working status of life alert bracelet and patient adherence Provided patient information for fall alert systems. 09-26-2020: Review of fall prevention and safety, especially since the patient walks her dog daily and has a history of falls. The patient verbalized that she is being mindful of her surroundings.  Evaluation of current treatment plan related to fall prevention and safety  and patient's adherence to plan as established by provider. 07-25-2020: The patient is improving and more active than usual. She is being independent and told her son not to come everyday that she would call if she needed him. She states she is being mindful of safety and monitoring for changes. Has a routine and this works well for her. Denies any new concerns at this time. 09-26-2020: Maintaining her independence. The patient states that she is slower but being careful and taking care of herself and her dog. Denies any new concerns. Advised patient to call the office for new concerns, new falls, or questions  Provided education to patient re: fall prevention and safety in the home Provided patient with fall prevention and safety  educational materials related to prevention of falls in the home by my chart platform Reviewed scheduled/upcoming provider appointments including: 11-11-2020 at 1:20 pm Discussed plans with patient for ongoing care management follow up and provided patient with direct contact information for care management team Self-Care Deficits:  Unable to independently manage falls prevention and safety as  evidence of fall with fracture to right humerus with repair on 02-13-2020 Lacks social connections Does not contact provider office for questions/concerns Patient Goals:  - Utilize walker and Cane(assistive device) appropriately with all ambulation - De-clutter walkways - Change positions slowly - Wear secure fitting shoes at all times with ambulation - Utilize home lighting for dim lit areas - Demonstrate self and pet awareness at all times - activities of daily living skills assessed - assistive or adaptive device use encouraged - barriers to physical activity or exercise addressed - barriers to physical activity or exercise identified - barriers to safety identified - cognition assessed - cognitive-stimulating activities promoted - fall prevention plan reviewed and updated - fear of falling, loss of independence and pain acknowledged - medication list reviewed - modification of home and work environment promoted - participation in rehabilitation therapy encouraged Follow Up Plan:  Face to face visit with the [facility] at pcp visit on 11-11-2020 at 1:00/1:20 pm    Care Plan : RNCM: Hypertension (Adult)  Updates made by Vanita Ingles, RN since 09/26/2020 12:00 AM     Problem: RNCM: Hypertension (Hypertension)   Priority: Medium     Long-Range Goal: RNCM: Hypertension  Monitored   Start Date: 04/17/2020  Expected End Date: 03/22/2021  This Visit's Progress: On track  Recent Progress: On track  Priority: Medium  Note:   Objective:  Last practice recorded BP readings:  BP Readings from Last 3 Encounters:  09/02/20 123/72  07/01/20 (!) 144/67  03/25/20 (!) 174/73   Most recent eGFR/CrCl: No results found for: EGFR  No components found for: CRCL Current Barriers:  Knowledge Deficits related to basic understanding of hypertension pathophysiology and self care management Knowledge Deficits related to understanding of medications prescribed for management of hypertension Limited  Social Support Unable to independently manage HTN Lacks social connections Does not contact provider office for questions/concerns Case Manager Clinical Goal(s):  patient will verbalize understanding of plan for hypertension management patient will attend all scheduled medical appointments: 11-11-2020 at 1:20 pm patient will demonstrate improved adherence to prescribed treatment plan for hypertension as evidenced by taking all medications as prescribed, monitoring and recording blood pressure as directed, adhering to low sodium/DASH diet patient will demonstrate improved health management independence as evidenced by checking blood pressure as directed and notifying PCP if SBP>160 or DBP > 90, taking all medications as prescribe, and adhering to a low sodium diet as discussed. patient will verbalize basic understanding of hypertension disease process and self health management plan as evidenced by compliance with medications, compliance with heart healthy/Ada diet, and working with the CCM team to optimize health and well being  Interventions:  Collaboration with Parks Ranger, Devonne Doughty, DO regarding development and update of comprehensive plan of care as evidenced by provider attestation and co-signature Inter-disciplinary care team collaboration (see longitudinal plan of care) Evaluation of current treatment plan related to hypertension self management and patient's adherence to plan as established by provider. 09-26-2020: The patient is doing well and denies any new concerns with her blood pressure. States it has been in better range.  Provided education to patient re: stroke prevention, s/s of heart attack and stroke, DASH diet, complications of uncontrolled blood pressure Reviewed medications with patient and discussed importance of compliance Discussed plans with patient for ongoing care management follow up and provided patient with direct contact information for care management team Advised  patient, providing education and rationale, to monitor blood pressure daily and record, calling PCP for findings outside established parameters.  Reviewed scheduled/upcoming provider appointments including: 11-11-2020 at 120 pm Self-Care Activities: - Self administers medications as prescribed Attends all scheduled provider appointments Calls provider office for new concerns, questions, or BP outside discussed parameters Checks BP and records as discussed Follows a low sodium diet/DASH diet Patient Goals: - check blood pressure 3 times per week - choose a place to take my blood pressure (home, clinic or office, retail store) - write blood pressure results in a log or diary - agree on reward when goals are met - agree to work together to make changes - ask questions to understand - have a family meeting to talk about healthy habits - learn about high blood pressure  Follow Up Plan:  Face to Face appointment with care management team member scheduled for:  11-11-2020 at 1 pm when she comes to see the pcp for visit     Care Plan : RNCM: Diabetes Type 2 (Adult)  Updates made by Vanita Ingles, RN since 09/26/2020 12:00 AM     Problem: RNCM: Glycemic Management (Diabetes, Type 2)   Priority: High     Long-Range Goal: RNCM: Glycemic Management Optimized   Start Date: 04/17/2020  Expected  End Date: 07/20/2021  This Visit's Progress: On track  Recent Progress: On track  Priority: High  Note:   Objective:  Lab Results  Component Value Date   HGBA1C 8.4 (A) 07/01/2020    Lab Results  Component Value Date   CREATININE 0.93 02/13/2020   CREATININE 0.94 (H) 09/06/2019   CREATININE 1.23 (H) 08/08/2019    No results found for: EGFR Current Barriers:  Knowledge Deficits related to basic Diabetes pathophysiology and self care/management Knowledge Deficits related to medications used for management of diabetes- is not currently taking medications for diabetes  Does not use cbg meter   Limited Social Support Lacks social connections Does not contact provider office for questions/concerns Case Manager Clinical Goal(s):  patient will demonstrate improved adherence to prescribed treatment plan for diabetes self care/management as evidenced by: daily monitoring and recording of CBG  adherence to ADA/ carb modified diet exercise 3/4 days/week adherence to prescribed medication regimen contacting provider for new or worsened symptoms or questions Interventions:  Collaboration with Olin Hauser, DO regarding development and update of comprehensive plan of care as evidenced by provider attestation and co-signature Inter-disciplinary care team collaboration (see longitudinal plan of care) Provided education to patient about basic DM disease process. 07-25-2020: The patient is having a hard time taking blood sugars at times due to her right hand having a "limp wrist", she has a brace ordered and hopes it will help. She has to write with her left hand and its hard for her. She states she is trying to eat better. 09-26-2020: Is still struggling with wrist and  has had nerve conduction study and did not get the news she was hoping for. The patient states that she is taking care of herself as best as she can. Denies any acute findings. Education and support given.  Discussed plans with patient for ongoing care management follow up and provided patient with direct contact information for care management team Reviewed scheduled/upcoming provider appointments including: 11-11-2020 at 1:20 pm Review of patient status, including review of consultants reports, relevant laboratory and other test results, and medications completed. Self-Care Activities - Attends all scheduled provider appointments Checks blood sugars as prescribed and utilize hyper and hypoglycemia protocol as needed Adheres to prescribed ADA/carb modified Patient Goals: -barriers to adherence to treatment plan identified -  blood glucose monitoring encouraged - blood glucose readings reviewed - resources required to improve adherence to care identified - self-awareness of signs/symptoms of hypo or hyperglycemia encouraged  Follow Up Plan:   Face to Face appointment with care management team member scheduled for:  11-11-2020 at 1:00/1:20 pm    Care Plan : RNCM: Management of HLD  Updates made by Vanita Ingles, RN since 09/26/2020 12:00 AM     Problem: RNCM: HLD Management   Priority: Medium     Long-Range Goal: RNCM: HLD Management   Start Date: 04/17/2020  Expected End Date: 06/10/2021  This Visit's Progress: On track  Recent Progress: On track  Priority: Medium  Note:   Current Barriers:  Poorly controlled hyperlipidemia, complicated by DM and HTN Current antihyperlipidemic regimen: zocor 40 mg QD Most recent lipid panel:     Component Value Date/Time   CHOL 168 09/06/2019 0808   TRIG 173 (H) 09/06/2019 0808   HDL 66 09/06/2019 0808   CHOLHDL 2.5 09/06/2019 0808   VLDL 26 08/19/2016 0802   LDLCALC 75 09/06/2019 0808   ASCVD risk enhancing conditions: age >56, DM, HTN, CKD Lacks social connections Does not  contact provider office for questions/concerns RN Care Manager Clinical Goal(s):  patient will work with [place], providers, and care team towards execution of optimized self-health management plan patient will verbalize understanding of plan for effective management of HLD  patient will work with [facility] and pcp  to address needs related to management of HLD  patient will attend all scheduled medical appointments: 11-11-2020 at 1:20 pm Interventions: Collaboration with Olin Hauser, DO regarding development and update of comprehensive plan of care as evidenced by provider attestation and co-signature Inter-disciplinary care team collaboration (see longitudinal plan of care) Medication review performed; medication list updated in electronic medical record.   Inter-disciplinary care team collaboration (see longitudinal plan of care) Referred to pharmacy team for assistance with HLD medication management Evaluation of current treatment plan related to HLD  and patient's adherence to plan as established by provider. 07-25-2020: The patient is doing well and denies any new concerns with the management of her HLD. She is eating well and being more independent. The patient has started driving herself to the MD office appointments, the grocery store and to get her hair fixed. She is walking her dog daily and getting exercise. Is remaining safe in her environment. 09-26-2020: Doing well at this time. Denies any new issues with HLD health. Will continue to monitor.  Advised patient to call the office for changes or questions Provided education to patient re: heart healthy/ADA diet. 09-26-2020: endorses heart healthy/ADA diet Reviewed scheduled/upcoming provider appointments including: 11-11-2020 at 120 pm Discussed plans with patient for ongoing care management follow up and provided patient with direct contact information for care management team Patient Goals/Self-Care Activities: - call for medicine refill 2 or 3 days before it runs out - call if I am sick and can't take my medicine - keep a list of all the medicines I take; vitamins and herbals too - learn to read medicine labels - use a pillbox to sort medicine - use an alarm clock or phone to remind me to take my medicine - change to whole grain breads, cereal, pasta - drink 6 to 8 glasses of water each day - eat 3 to 5 servings of fruits and vegetables each day - eat 5 or 6 small meals each day - limit fast food meals to no more than 1 per week - manage portion size - prepare main meal at home 3 to 5 days each week - read food labels for fat, fiber, carbohydrates and portion size - be open to making changes - I can manage, know and watch for signs of a heart attack - if I have chest pain, call for  help - learn about small changes that will make a big difference - learn my personal risk factors - barriers to meeting goals identified - change-talk evoked - choices provided - collaboration with team encouraged - decision-making supported - difficulty of making life-long changes acknowledged - health risks reviewed - problem-solving facilitated - questions answered - readiness for change evaluated - reassurance provided - self-reflection promoted - self-reliance encouraged - verbalization of feelings encouraged Follow Up Plan:  Face to Face visit with the patient at pcp visit on 11-11-2020 at 1:00-1:20 pm       Plan:Face to Face appointment with care management team member scheduled for: 11-11-2020 at 1/120 pm  Noreene Larsson RN, MSN, [place] Mobile: [phone]

## 2020-09-27 ENCOUNTER — Other Ambulatory Visit: Payer: Self-pay

## 2020-09-27 ENCOUNTER — Ambulatory Visit (INDEPENDENT_AMBULATORY_CARE_PROVIDER_SITE_OTHER): Payer: PPO | Admitting: Family Medicine

## 2020-09-27 VITALS — Wt 106.0 lb

## 2020-09-27 DIAGNOSIS — E538 Deficiency of other specified B group vitamins: Secondary | ICD-10-CM | POA: Diagnosis not present

## 2020-09-27 MED ORDER — CYANOCOBALAMIN 1000 MCG/ML IJ SOLN
1000.0000 ug | Freq: Once | INTRAMUSCULAR | Status: AC
  Administered 2020-09-27: 1000 ug via INTRAMUSCULAR

## 2020-10-01 ENCOUNTER — Telehealth: Payer: PPO

## 2020-10-02 ENCOUNTER — Telehealth: Payer: Self-pay | Admitting: Licensed Clinical Social Worker

## 2020-10-02 NOTE — Telephone Encounter (Signed)
    Clinical Social Work  Care Management   Phone Outreach    10/02/2020 Name: IMOGEN MADDALENA MRN: [id number] DOB: 06-12-1929  BERDELL NEVITT is a 87 y.o. year old female who is a primary care patient of Olin Hauser, DO .   Reason for referral: [employer].    F/U phone call today to assess needs, progress and barriers with care plan goals.   Telephone outreach was unsuccessful. A HIPPA compliant phone message was left for the patient providing contact information and requesting a return call.   Plan:CCM LCSW will wait for return call. If no return call is received, appointment will be rescheduled with CCM  Review of patient status, including review of consultants reports, relevant laboratory and other test results, and collaboration with appropriate care team members and the patient's provider was performed as part of comprehensive patient evaluation and provision of care management services.    Christa See, MSW, [place] Care Management [email or web address] Phone [phone] 9:34 AM

## 2020-10-13 IMAGING — RF DG ESOPHAGUS
9 of 12 series · 14 of 21 positions shown · non-contrast
Comparison: None.

CLINICAL DATA: Esophageal dysphagia, unintentional weight loss

EXAM:
ESOPHOGRAM/BARIUM SWALLOW
TECHNIQUE: Combined double contrast and single contrast examination performed
using effervescent crystals, thick barium liquid, and thin barium
liquid.
FLUOROSCOPY TIME:  Fluoroscopy Time:  1.5 minute
Radiation Exposure Index (if provided by the fluoroscopic device): 6
mGy
Number of Acquired Spot Images: 0

[Series 1: cp_standard · 0.25mm/px · 3 of 28 frames shown (1 of 9)]
[frame 3/28]
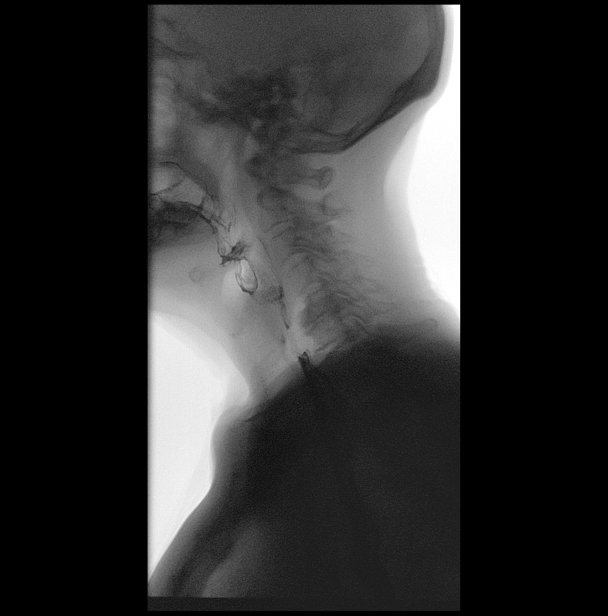
[frame 15/28]
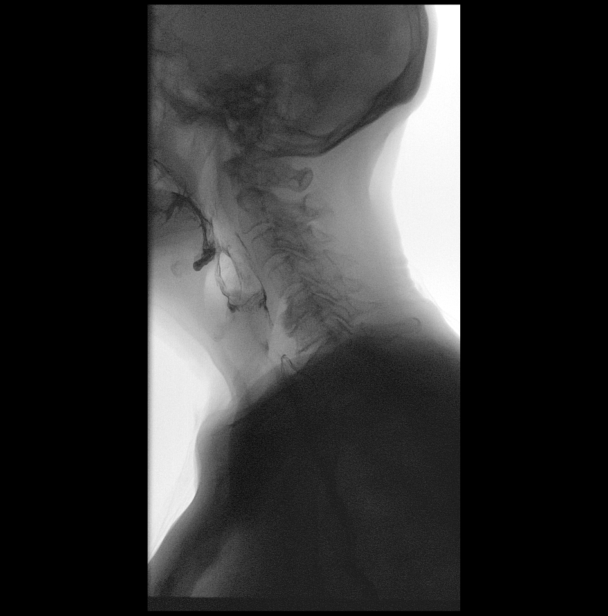
[frame 24/28]
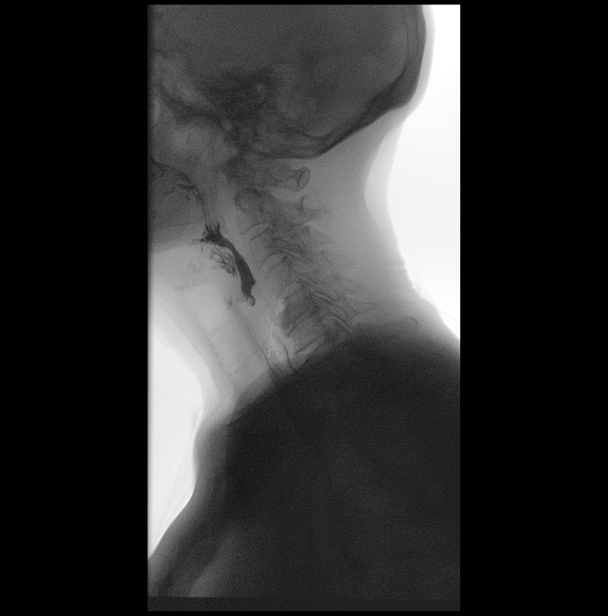

[Series 2: cp_standard · 0.25mm/px · 2 of 30 frames shown (2 of 9)]
[frame 16/30]
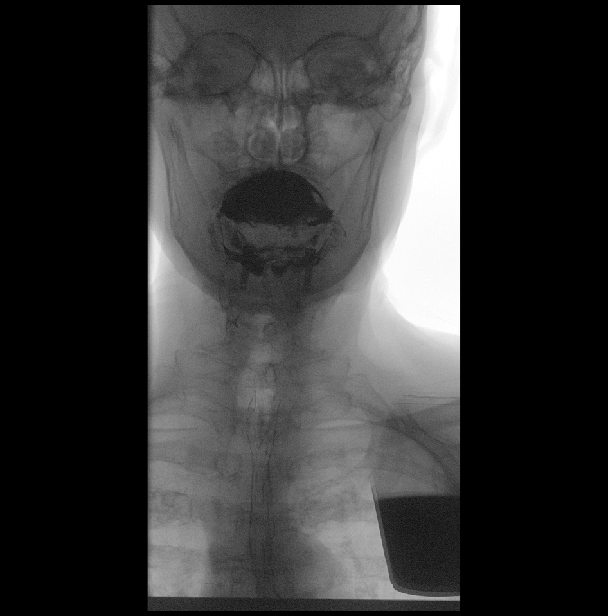
[frame 26/30]
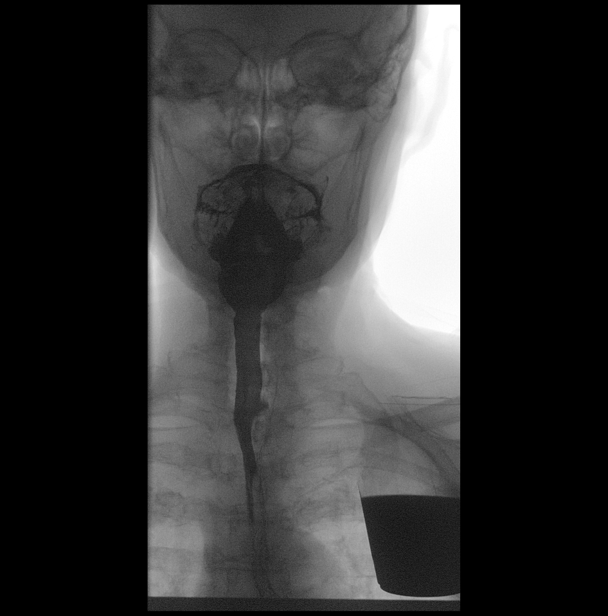

[Series 3: cp_standard · 0.25mm/px · 1 of 1 slices shown (3 of 9)]
[im 1/1]
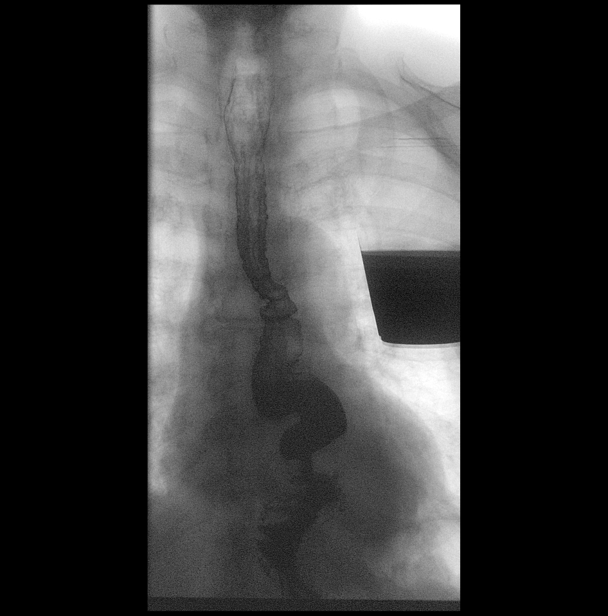

[Series 4: cp_standard · 0.25mm/px · 3 of 82 frames shown (4 of 9)]
[frame 13/82]
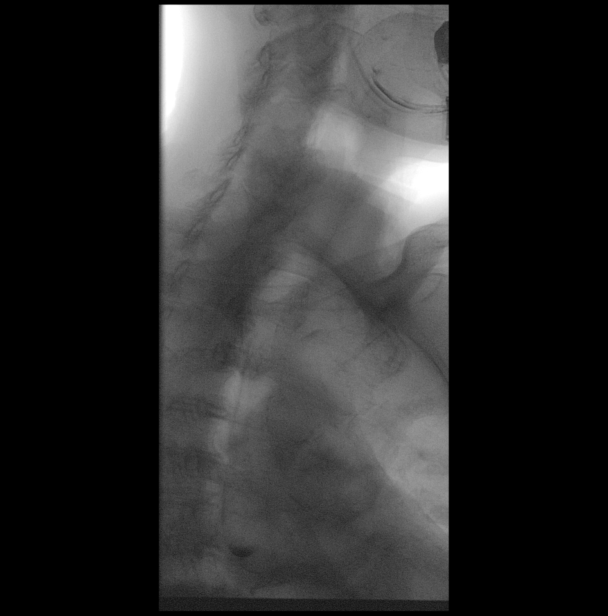
[frame 51/82]
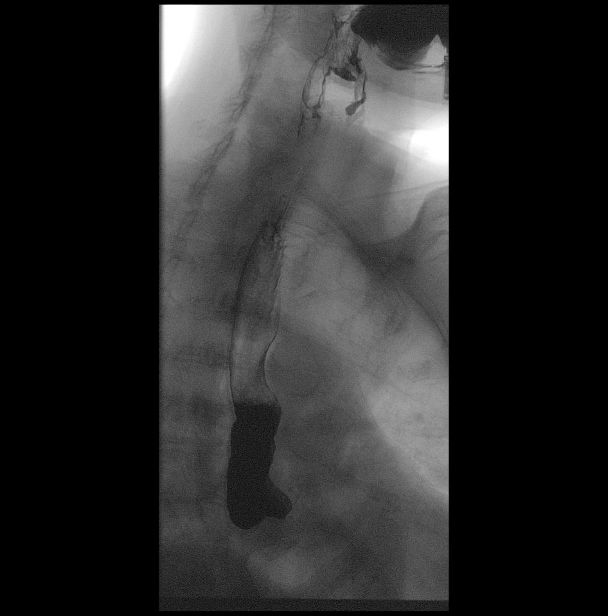
[frame 70/82]
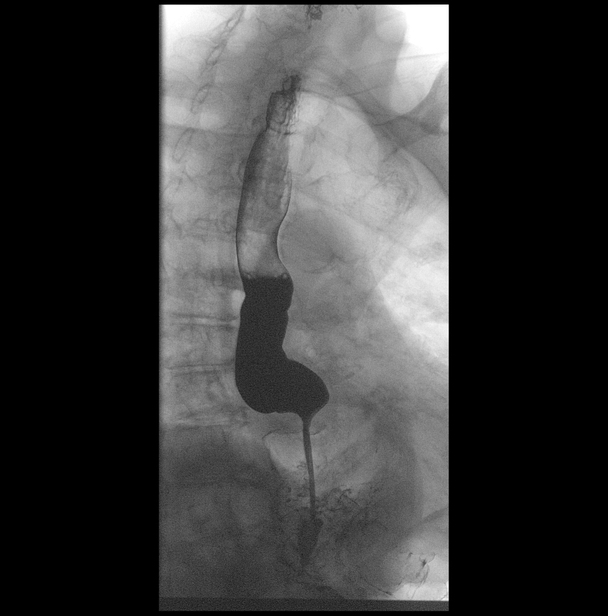

[Series 6: cp_standard · 0.28mm/px · 1 of 1 slices shown (5 of 9)]
[im 1/1]
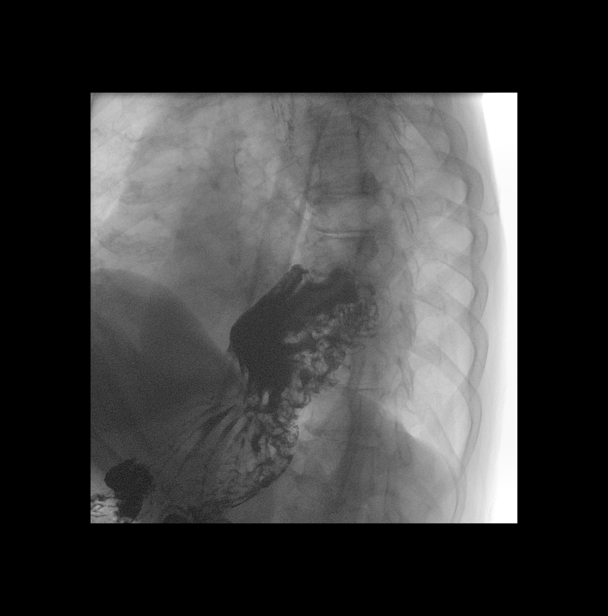

[Series 7: cp_standard · 0.29mm/px · 1 of 1 slices shown (6 of 9)]
[im 1/1]
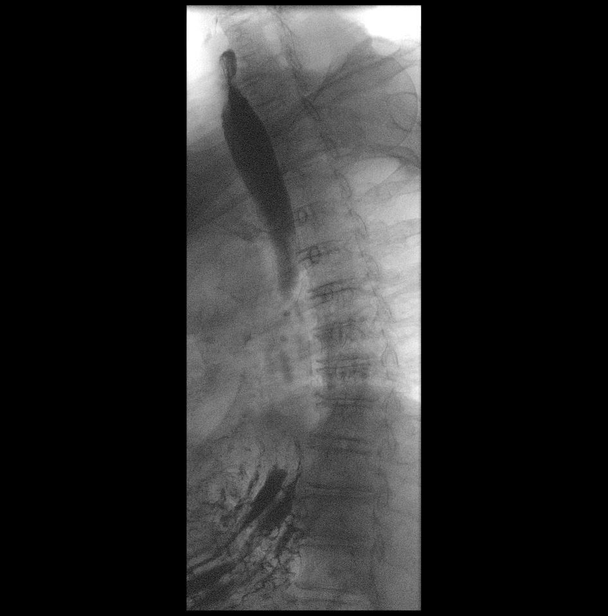

[Series 9: cp_standard · 0.29mm/px · 1 of 1 slices shown (7 of 9)]
[im 1/1]
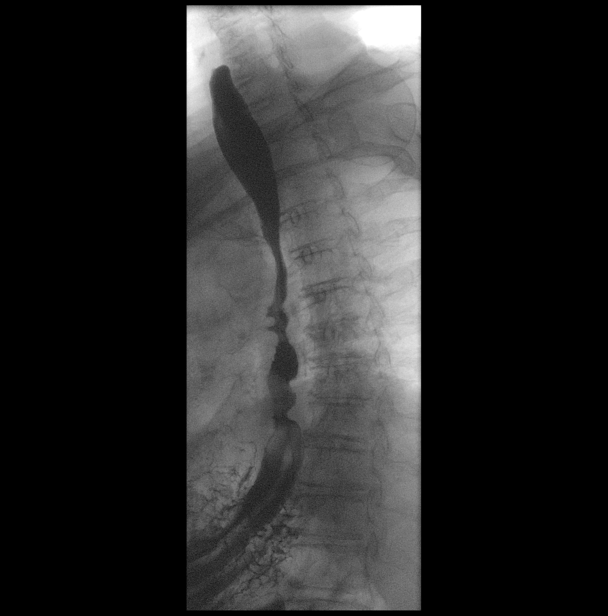

[Series 10: cp_standard · 0.29mm/px · 1 of 1 slices shown (8 of 9)]
[im 1/1]
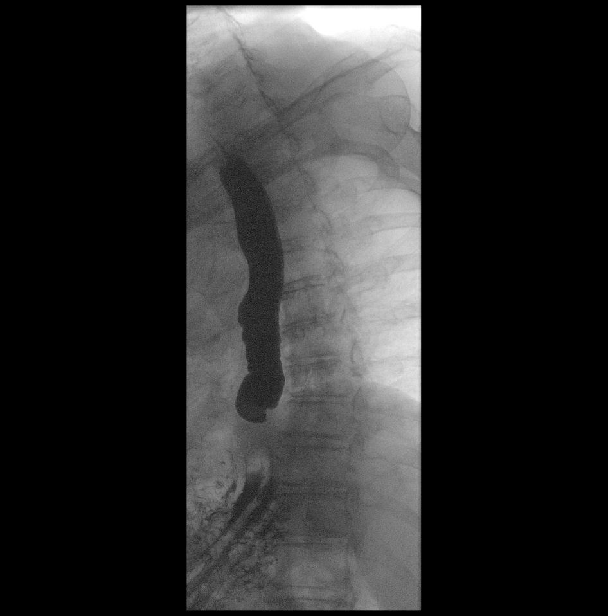

[Series 12: cp_standard · 0.26mm/px · 1 of 1 slices shown (9 of 9)]
[im 1/1]
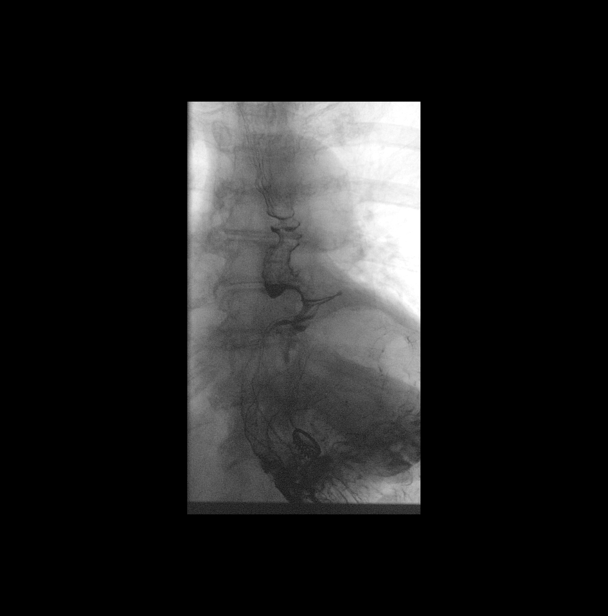

[14 of 21 positions shown; findings below may reference images not displayed]

FINDINGS: Normal pharyngeal anatomy and motility. Contrast flowed freely
through the esophagus without evidence of a stricture or mass.
Nnormal esophageal mucosa without evidence of irregularity or
ulceration. Esophageal motility was normal. Moderate
gastroesophageal reflux. Large hiatal hernia.

At the end of the examination a 13 mm barium tablet was administered
which transited through the esophagus and esophagogastric junction
without delay.
IMPRESSION: Moderate gastroesophageal reflux.

Large hiatal hernia.

No esophageal stricture.

## 2020-10-15 ENCOUNTER — Ambulatory Visit: Payer: PPO | Admitting: Licensed Clinical Social Worker

## 2020-10-15 DIAGNOSIS — F418 Other specified anxiety disorders: Secondary | ICD-10-CM

## 2020-10-15 DIAGNOSIS — E785 Hyperlipidemia, unspecified: Secondary | ICD-10-CM

## 2020-10-15 DIAGNOSIS — E038 Other specified hypothyroidism: Secondary | ICD-10-CM

## 2020-10-15 DIAGNOSIS — E1169 Type 2 diabetes mellitus with other specified complication: Secondary | ICD-10-CM

## 2020-10-15 DIAGNOSIS — E1122 Type 2 diabetes mellitus with diabetic chronic kidney disease: Secondary | ICD-10-CM

## 2020-10-16 NOTE — Chronic Care Management (AMB) (Signed)
Chronic Care Management    Clinical Social Work Note  10/16/2020 Name: Lisa Mills MRN: [id number] DOB: 28-May-1929  Lisa Mills is a 87 y.o. year old female who is a primary care patient of Olin Hauser, DO. The CCM team was consulted to assist the patient with chronic disease management and/or care coordination needs related to: Mental Health Counseling and Resources.   Engaged with patient by telephone for follow up visit in response to provider referral for social work chronic care management and care coordination services.   Consent to Services:  The patient was given information about Chronic Care Management services, agreed to services, and gave verbal consent prior to initiation of services.  Please see initial visit note for detailed documentation.   Patient agreed to services and consent obtained.   Consent to Services:  The patient was given information about Care Management services, agreed to services, and gave verbal consent prior to initiation of services.  Please see initial visit note for detailed documentation.   Patient agreed to services today and consent obtained.   Assessment: Engaged with patient by phone in response to provider referral for social work care coordination services: [place] [facility] Resources.    Patient continues to maintain positive progress with care plan goals. Strategies to promote mood discussed. See Care Plan below for interventions and patient self-care activities.  Recent life changes or stressors: Management of health conditions  Recommendation: Patient may benefit from, and is in agreement work with LCSW to address care coordination needs and will continue to work with the clinical team to address health care and disease management related needs.   Follow up Plan: Patient would like continued follow-up from CCM LCSW .  per patient's request will follow up in 12/10/20.  Will call office if needed prior to next  encounter.  SDOH (Social Determinants of Health) assessments and interventions performed:  NA  Advanced Directives Status: Not addressed in this encounter.  CCM Care Plan  Allergies  Allergen Reactions   Aspirin Other (See Comments)    Burns stomach   Conray [Iothalamate] Hives    IV dye conray-400   Dye Fdc Red [Red Dye] Hives   Sulfa Antibiotics     Unknown Reaction, not used in years   Prednisone     Indigestion     Outpatient Encounter Medications as of 10/15/2020  Medication Sig   acetaminophen (TYLENOL) 500 MG tablet Take 1 tablet (500 mg total) by mouth every 12 (twelve) hours.   Blood Glucose Monitoring Suppl ([place]) w/Device KIT Use glucometer to check blood sugar daily.   Cholecalciferol (VITAMIN D) 50 MCG (2000 UT) CAPS Take 2,000 Units by mouth daily.    cyanocobalamin (,VITAMIN B-12,) 1000 MCG/ML injection Inject 1 mL (1,000 mcg total) into the muscle every 30 (thirty) days. For 3 months, then need repeat B12 lab level   escitalopram (LEXAPRO) 5 MG tablet TAKE 1 TABLET BY MOUTH ONCE DAILY WITH FOOD   ferrous sulfate 325 (65 FE) MG tablet Take 325 mg by mouth daily.    glucose blood (ONE TOUCH ULTRA TEST) test strip CHECK BLOOD SUGAR UP TO 2 TIMES A DAY.   glycerin adult 2 g suppository Place 1 suppository rectally as needed for constipation.   Histamine Dihydrochloride (AUSTRALIAN DREAM ARTHRITIS) 0.025 % CREA Apply 1 application topically daily as needed (Pain).   hydrocortisone (ANUSOL-HC) 2.5 % rectal cream USE 1 APPLICATION RECTALLY TWICE DAILY AS DIRECTED (Patient taking differently: Place  1 application rectally daily as needed for hemorrhoids or anal itching.)   levothyroxine (SYNTHROID) 25 MCG tablet TAKE 1 TABLET BY MOUTH ONCE DAILY ON AN EMPTY STOMACH. WAIT 30 MINUTES BEFORE TAKING OTHER MEDS.   lisinopril (ZESTRIL) 5 MG tablet TAKE 1 TABLET BY MOUTH ONCE DAILY   nitrofurantoin (MACRODANTIN) 100 MG capsule Take 1 capsule (100 mg total) by mouth  daily.   omeprazole (PRILOSEC) 20 MG capsule TAKE 1 CAPSULE BY MOUTH ONCE DAILY   OneTouch Delica Lancets 50P MISC 1 each by Does not apply route 3 (three) times daily. Dx:E11.9   polyethylene glycol (MIRALAX / GLYCOLAX) 17 g packet Take 17 g by mouth daily as needed for moderate constipation or severe constipation. (Patient taking differently: Take 17 g by mouth daily.)   simvastatin (ZOCOR) 40 MG tablet Take 1 tablet (40 mg total) by mouth at bedtime.   traZODone (DESYREL) 50 MG tablet Take 0.5-1 tablets (25-50 mg total) by mouth at bedtime as needed for sleep. (Patient taking differently: Take 25 mg by mouth at bedtime as needed for sleep.)   witch Lisa-glycerin (TUCKS) pad Apply 1 application topically as needed for itching.   No facility-administered encounter medications on file as of 10/15/2020.    Patient Active Problem List   Diagnosis Date Noted   Right wrist drop 03/25/2020   Psychophysiological insomnia 10/06/2019   Nonintractable headache 10/06/2019   Urinary incontinence 10/06/2019   Internal and external bleeding hemorrhoids 08/15/2019   Sacral pain    Radicular pain of sacrum 07/11/2019   Sacral fracture, closed ([place]) 07/04/2019   Fall 07/04/2019   Bilateral pubic rami fractures, sequela 07/04/2019   Background diabetic retinopathy ([place]) 01/23/2019   Schatzki's ring 03/04/2017   Post herpetic neuralgia 10/06/2016   Hx of colonic polyps 09/22/2016   Osteopenia 09/02/2016   Anxiety associated with depression 08/25/2016   Trapezius muscle spasm 05/25/2016   Intermittent left lower quadrant abdominal pain 03/16/2016   Anemia in chronic kidney disease (CKD) 02/26/2016   Type 2 diabetes with stage 3 chronic kidney disease GFR 30-59 ([place]) 02/25/2016   Hypothyroidism 02/25/2016   Constipation 02/25/2016   GERD (gastroesophageal reflux disease) 02/25/2016   Hyperlipidemia associated with type 2 diabetes mellitus ([place]) 02/25/2016   DJD (degenerative joint disease) of  cervical spine 02/25/2016   Osteoarthritis of multiple joints 02/25/2016   Hiatal hernia 02/25/2016   CKD stage 3 due to type 2 diabetes mellitus ([place]) 02/25/2016   Chronic kidney disease, unspecified 09/05/2013   Benign hypertension with CKD (chronic kidney disease) stage III ([place]) 09/05/2013   CMC arthritis, thumb, degenerative 06/15/2013   Onychomycosis due to dermatophyte 05/25/2013   Acquired keratoderma 05/25/2013    Conditions to be addressed/monitored: Anxiety and Depression; Mental Health Concerns   Care Plan : General Social Work (Adult)  Updates made by Rebekah [place]field, LCSW since 10/16/2020 12:00 AM     Problem: Coping Skills (General Plan of Care)      Long-Range Goal: Coping Skills Enhanced   Start Date: 02/01/2020  This Visit's Progress: On track  Recent Progress: On track  Priority: Medium  Note:   Timeframe:  Long-Range Goal Priority:  Medium Start Date:  04/10/20                  Expected End Date: 01/18/21                  Follow Up Date - 12/10/20    Current barriers:  Patient unable to consistently  perform activities of daily living and needs additional assistance and support in order to meet this unmet need Limited social support, ADL IADL limitations, Mental Health Concerns , Social Isolation, and Limited access to caregiver Lacks social connections Clinical Goals: Over the next 120 days, patient will gain additional education on in home support resources within her area  Over the next 120 days, patient will work with SW to address concerns related to anxiety management Interventions : Assessed needs, level of care concerns, and barriers to care Patient reports feelings of frustration that her wrist/arm has not improved since surgery. Patient has been participating in physical therapy and wearing a specialized brace to assist with strengthening arm. Despite completing exercises (with weights) daily and wearing the brace for majority of the day, patient  reports no improvement in her condition 09/27: Patient reports that she has discontinued physical therapy and has an upcoming appt with Neurology to discuss tx plan. She has completed nerve tests to both arms Patient reports that she is working on acceptance of her condition, in the event that the mobility in the arm/hand stays the same 09/27: Patient reports discomfort around genitalia due to incontinence. Patient is utilizing a combination of prescription and OTC medication to soothe symptoms CCM LCSW provided support and encouragement. Gratitude thinking, in addition, to prioritizing self-care were discussed to assist in management of symptoms to promote mood Patient agreed to continue walking dog every morning to promote feelings of productivity and an enhanced mood 09/27: Patient remains active with walking her dog; however, states she takes her time to promote safety CCM LCSW reviewed upcoming appointments with patient Patient is interested in PCP completing an order/referral for an assessment of the radial nerve in elbow. Per patient, her surgeon, Dr. Marcelino Scot has written a prescription for this test; however, the specialist is out of network and is in [place], [place]. Patient prefers that PCP refer her somewhere local and within the [place] 09/27: Patient was [facility] the [facility] located locally Patient agreed to provide a copy of the prescription to the office today. Prefers for Dr. Raliegh Ip to complete new prescription or inform patient to contact Dr. Carlean Jews office about referral to a provider within [facility] network CCM LCSW used empathetic and active and reflective listening, validated patient's feelings/concerns, and provided emotional support. Strategies to assist in coping with emotional regulation was discussed CCM LCSW collaborated with RN CM Noreene Larsson informing her of patient's treatment of discomfort surrounding health condiitons Patient continues to drive independently to local  medical or hair appointments. Son assists with transportation needs that are out of [place] or [employer] 1:1 collaboration with PCP regarding development and update of comprehensive plan of care as evidenced by provider attestation and co-signature  Discussed plans with patient for ongoing care management follow up and provided patient with direct contact information for care management team Inter-disciplinary care team collaboration (see longitudinal plan of care) Collaborated with appropriate clinical care team members regarding patient needs Patient Goals for the next 120 days: Continue adherence to treatment plan Schedule follow up appointment with PCP Continue utilizing support system to assist with management of symptoms      Christa See, MSW, [email or web address] Phone [phone] 9:24 AM

## 2020-10-16 NOTE — Patient Instructions (Signed)
Visit Information   Goals Addressed             This Visit's Progress    SW-Manage My Emotions   On track    Timeframe:  Long-Range Goal Priority:  Medium Start Date:  02/01/20                         Expected End Date:  12/31//22                    Follow Up Date - 12/10/20   Patient Goals for the next 120 days: Continue adherence to treatment plan Schedule follow up appointment with PCP Continue utilizing support system to assist with management of symptoms     SW-Matintain My Quality of Life   On track    Timeframe:  Long-Range Goal Priority:  Medium Start Date:  04/10/20                  Expected End Date: 01/18/21                  Follow Up Date - 12/10/20   Patient Goals for the next 120 days: Continue adherence to treatment plan Schedule follow up appointment with PCP Continue utilizing support system to assist with management of symptoms        Patient verbalizes understanding of instructions provided today and agrees to view in [place].   Telephone follow up appointment with care management team member scheduled for:12/10/20  Christa See, MSW, [email or web address] Phone [phone] 9:27 AM

## 2020-10-18 DIAGNOSIS — E785 Hyperlipidemia, unspecified: Secondary | ICD-10-CM

## 2020-10-18 DIAGNOSIS — E1122 Type 2 diabetes mellitus with diabetic chronic kidney disease: Secondary | ICD-10-CM

## 2020-10-18 DIAGNOSIS — F418 Other specified anxiety disorders: Secondary | ICD-10-CM

## 2020-10-18 DIAGNOSIS — I10 Essential (primary) hypertension: Secondary | ICD-10-CM | POA: Diagnosis not present

## 2020-10-18 DIAGNOSIS — E1169 Type 2 diabetes mellitus with other specified complication: Secondary | ICD-10-CM | POA: Diagnosis not present

## 2020-10-18 DIAGNOSIS — N1831 Chronic kidney disease, stage 3a: Secondary | ICD-10-CM | POA: Diagnosis not present

## 2020-10-18 DIAGNOSIS — E063 Autoimmune thyroiditis: Secondary | ICD-10-CM

## 2020-10-18 DIAGNOSIS — E038 Other specified hypothyroidism: Secondary | ICD-10-CM

## 2020-10-21 ENCOUNTER — Ambulatory Visit (INDEPENDENT_AMBULATORY_CARE_PROVIDER_SITE_OTHER): Payer: PPO

## 2020-10-21 DIAGNOSIS — E785 Hyperlipidemia, unspecified: Secondary | ICD-10-CM

## 2020-10-21 DIAGNOSIS — R296 Repeated falls: Secondary | ICD-10-CM

## 2020-10-21 DIAGNOSIS — I10 Essential (primary) hypertension: Secondary | ICD-10-CM

## 2020-10-21 DIAGNOSIS — N1831 Chronic kidney disease, stage 3a: Secondary | ICD-10-CM

## 2020-10-21 DIAGNOSIS — R32 Unspecified urinary incontinence: Secondary | ICD-10-CM

## 2020-10-21 DIAGNOSIS — E1169 Type 2 diabetes mellitus with other specified complication: Secondary | ICD-10-CM

## 2020-10-21 DIAGNOSIS — R197 Diarrhea, unspecified: Secondary | ICD-10-CM

## 2020-10-21 DIAGNOSIS — N3946 Mixed incontinence: Secondary | ICD-10-CM

## 2020-10-21 DIAGNOSIS — E1122 Type 2 diabetes mellitus with diabetic chronic kidney disease: Secondary | ICD-10-CM

## 2020-10-21 NOTE — Chronic Care Management (AMB) (Signed)
Chronic Care Management   CCM RN Visit Note  10/21/2020 Name: Lisa Mills MRN: [id number] DOB: [birth year]  Subjective: Lisa Mills is a 87 y.o. year old female who is a primary care patient of Olin Hauser, DO. The care management team was consulted for assistance with disease management and care coordination needs.    Engaged with patient by telephone for follow up visit in response to provider referral for case management and/or care coordination services.   Consent to Services:  The patient was given information about Chronic Care Management services, agreed to services, and gave verbal consent prior to initiation of services.  Please see initial visit note for detailed documentation.   Patient agreed to services and verbal consent obtained.   Assessment: Review of patient past medical history, allergies, medications, health status, including review of consultants reports, laboratory and other test data, was performed as part of comprehensive evaluation and provision of chronic care management services.   SDOH (Social Determinants of Health) assessments and interventions performed:    CCM Care Plan  Allergies  Allergen Reactions   Aspirin Other (See Comments)    Burns stomach   Conray [Iothalamate] Hives    IV dye conray-400   Dye Fdc Red [Red Dye] Hives   Sulfa Antibiotics     Unknown Reaction, not used in years   Prednisone     Indigestion     Outpatient Encounter Medications as of 10/21/2020  Medication Sig   acetaminophen (TYLENOL) 500 MG tablet Take 1 tablet (500 mg total) by mouth every 12 (twelve) hours.   Blood Glucose Monitoring Suppl (Ray) w/Device KIT Use glucometer to check blood sugar daily.   Cholecalciferol (VITAMIN D) 50 MCG (2000 UT) CAPS Take 2,000 Units by mouth daily.    cyanocobalamin (,VITAMIN B-12,) 1000 MCG/ML injection Inject 1 mL (1,000 mcg total) into the muscle every 30 (thirty) days. For 3 months, then need  repeat B12 lab level   escitalopram (LEXAPRO) 5 MG tablet TAKE 1 TABLET BY MOUTH ONCE DAILY WITH FOOD   ferrous sulfate 325 (65 FE) MG tablet Take 325 mg by mouth daily.    glucose blood (ONE TOUCH ULTRA TEST) test strip CHECK BLOOD SUGAR UP TO 2 TIMES A DAY.   glycerin adult 2 g suppository Place 1 suppository rectally as needed for constipation.   Histamine Dihydrochloride (AUSTRALIAN DREAM ARTHRITIS) 0.025 % CREA Apply 1 application topically daily as needed (Pain).   hydrocortisone (ANUSOL-HC) 2.5 % rectal cream USE 1 APPLICATION RECTALLY TWICE DAILY AS DIRECTED (Patient taking differently: Place 1 application rectally daily as needed for hemorrhoids or anal itching.)   levothyroxine (SYNTHROID) 25 MCG tablet TAKE 1 TABLET BY MOUTH ONCE DAILY ON AN EMPTY STOMACH. WAIT 30 MINUTES BEFORE TAKING OTHER MEDS.   lisinopril (ZESTRIL) 5 MG tablet TAKE 1 TABLET BY MOUTH ONCE DAILY   nitrofurantoin (MACRODANTIN) 100 MG capsule Take 1 capsule (100 mg total) by mouth daily.   omeprazole (PRILOSEC) 20 MG capsule TAKE 1 CAPSULE BY MOUTH ONCE DAILY   OneTouch Delica Lancets 50P MISC 1 each by Does not apply route 3 (three) times daily. Dx:E11.9   polyethylene glycol (MIRALAX / GLYCOLAX) 17 g packet Take 17 g by mouth daily as needed for moderate constipation or severe constipation. (Patient taking differently: Take 17 g by mouth daily.)   simvastatin (ZOCOR) 40 MG tablet Take 1 tablet (40 mg total) by mouth at bedtime.   traZODone (DESYREL) 50 MG tablet Take 0.5-1  tablets (25-50 mg total) by mouth at bedtime as needed for sleep. (Patient taking differently: Take 25 mg by mouth at bedtime as needed for sleep.)   witch hazel-glycerin (TUCKS) pad Apply 1 application topically as needed for itching.   No facility-administered encounter medications on file as of 10/21/2020.    Patient Active Problem List   Diagnosis Date Noted   Right wrist drop 03/25/2020   Psychophysiological insomnia 10/06/2019    Nonintractable headache 10/06/2019   Urinary incontinence 10/06/2019   Internal and external bleeding hemorrhoids 08/15/2019   Sacral pain    Radicular pain of sacrum 07/11/2019   Sacral fracture, closed ([place]) 07/04/2019   Fall 07/04/2019   Bilateral pubic rami fractures, sequela 07/04/2019   Background diabetic retinopathy ([place]) 01/23/2019   Schatzki's ring 03/04/2017   Post herpetic neuralgia 10/06/2016   Hx of colonic polyps 09/22/2016   Osteopenia 09/02/2016   Anxiety associated with depression 08/25/2016   Trapezius muscle spasm 05/25/2016   Intermittent left lower quadrant abdominal pain 03/16/2016   Anemia in chronic kidney disease (CKD) 02/26/2016   Type 2 diabetes with stage 3 chronic kidney disease GFR 30-59 ([place]) 02/25/2016   Hypothyroidism 02/25/2016   Constipation 02/25/2016   GERD (gastroesophageal reflux disease) 02/25/2016   Hyperlipidemia associated with type 2 diabetes mellitus ([place]) 02/25/2016   DJD (degenerative joint disease) of cervical spine 02/25/2016   Osteoarthritis of multiple joints 02/25/2016   Hiatal hernia 02/25/2016   CKD stage 3 due to type 2 diabetes mellitus ([place]) 02/25/2016   Chronic kidney disease, unspecified 09/05/2013   Benign hypertension with CKD (chronic kidney disease) stage III ([place]) 09/05/2013   CMC arthritis, thumb, degenerative 06/15/2013   Onychomycosis due to dermatophyte 05/25/2013   Acquired keratoderma 05/25/2013    Conditions to be addressed/monitored:HTN, HLD, DMII, and falls and safety risk, urinary and bowel incontinence  Care Plan : RNCM: Fall Risk (Adult)  Updates made by Vanita Ingles, RN since 10/21/2020 12:00 AM     Problem: RNCM: Fall Risk   Priority: High     Long-Range Goal: Absence of Fall and Fall-Related Injury   Start Date: 04/17/2020  Expected End Date: 03/22/2021  This Visit's Progress: On track  Recent Progress: On track  Priority: High  Note:   Current Barriers:  Knowledge Deficits related to fall  precautions in patient with multiple chronic conditions.  Decreased adherence to prescribed treatment for fall prevention Unable to independently manage falls as evidence of frequent falls in the home Lacks social connections Does not contact provider office for questions/concerns Chronic Disease Management support and education needs related to falls prevention in patient with impaired gait and mobility  Lacks caregiver support.  Clinical Goal(s):  patient will demonstrate improved adherence to prescribed treatment plan for decreasing falls as evidenced by patient reporting and review of EMR patient will verbalize using fall risk reduction strategies discussed patient will not experience additional falls patient will verbalize understanding of plan for safety in her home and preventing falls patient will work with [facility] and pcp to address needs related to falls prevention and safety in the home patient will attend all scheduled medical appointments: 11-11-2020 at 120 pm Interventions:  Collaboration with Olin Hauser, DO regarding development and update of comprehensive plan of care as evidenced by provider attestation and co-signature Inter-disciplinary care team collaboration (see longitudinal plan of care) Provided written and verbal education re: Potential causes of falls and Fall prevention strategies Reviewed medications and discussed potential side effects of medications such  as dizziness and frequent urination. 10-21-2020: The patient is compliant with medications- denies any issues with dizziness, does have to go to the bathroom quickly for bowel movements at times but she is wearing a pad for protection now. Will continue to monitor.  Assessed for s/s of orthostatic hypotension Assessed for falls since last encounter. 07-25-2020: Denies any new falls. Likely will close this goal at next outreach. Patient has not had new falls >6 months. 10-21-2020: Denies any new falls. Is high  fall risk. Will continue to monitor during outreaches. Education and support given.  Assessed patients knowledge of fall risk prevention secondary to previously provided education. 10-21-2020: The patient is very knowledgeable about safety and the high risk of falls. Is independent and thankful that she is able to care for herself.  Assessed working status of life alert bracelet and patient adherence Provided patient information for fall alert systems. 10-21-2020: Review of fall prevention and safety, especially since the patient walks her dog daily and has a history of falls. The patient verbalized that she is being mindful of her surroundings.  Evaluation of current treatment plan related to fall prevention and safety  and patient's adherence to plan as established by provider. 07-25-2020: The patient is improving and more active than usual. She is being independent and told her son not to come everyday that she would call if she needed him. She states she is being mindful of safety and monitoring for changes. Has a routine and this works well for her. Denies any new concerns at this time. 10-3--2022: Maintaining her independence. The patient states that she is slower but being careful and taking care of herself and her dog. Denies any new concerns. Advised patient to call the office for new concerns, new falls, or questions  Provided education to patient re: fall prevention and safety in the home Provided patient with fall prevention and safety  educational materials related to prevention of falls in the home by my chart platform Reviewed scheduled/upcoming provider appointments including: 11-11-2020 at 1:20 pm Discussed plans with patient for ongoing care management follow up and provided patient with direct contact information for care management team Self-Care Deficits:  Unable to independently manage falls prevention and safety as evidence of fall with fracture to right humerus with repair on  02-13-2020 Lacks social connections Does not contact provider office for questions/concerns Patient Goals:  - Utilize walker and Cane(assistive device) appropriately with all ambulation - De-clutter walkways - Change positions slowly - Wear secure fitting shoes at all times with ambulation - Utilize home lighting for dim lit areas - Demonstrate self and pet awareness at all times - activities of daily living skills assessed - assistive or adaptive device use encouraged - barriers to physical activity or exercise addressed - barriers to physical activity or exercise identified - barriers to safety identified - cognition assessed - cognitive-stimulating activities promoted - fall prevention plan reviewed and updated - fear of falling, loss of independence and pain acknowledged - medication list reviewed - modification of home and work environment promoted - participation in rehabilitation therapy encouraged Follow Up Plan:  Telephone follow up for 11-25-2020 at 1:45 pm    Care Plan : RNCM: Hypertension (Adult)  Updates made by Vanita Ingles, RN since 10/21/2020 12:00 AM     Problem: RNCM: Hypertension (Hypertension)   Priority: Medium     Long-Range Goal: RNCM: Hypertension Monitored   Start Date: 04/17/2020  Expected End Date: 03/22/2021  This Visit's Progress: On track  Recent Progress: On track  Priority: Medium  Note:   Objective:  Last practice recorded BP readings:  BP Readings from Last 3 Encounters:  09/02/20 123/72  07/01/20 (!) 144/67  03/25/20 (!) 174/73   Most recent eGFR/CrCl: No results found for: EGFR  No components found for: CRCL Current Barriers:  Knowledge Deficits related to basic understanding of hypertension pathophysiology and self care management Knowledge Deficits related to understanding of medications prescribed for management of hypertension Limited Social Support Unable to independently manage HTN Lacks social connections Does not contact  provider office for questions/concerns Case Manager Clinical Goal(s):  patient will verbalize understanding of plan for hypertension management patient will attend all scheduled medical appointments: 11-11-2020 at 1:20 pm patient will demonstrate improved adherence to prescribed treatment plan for hypertension as evidenced by taking all medications as prescribed, monitoring and recording blood pressure as directed, adhering to low sodium/DASH diet patient will demonstrate improved health management independence as evidenced by checking blood pressure as directed and notifying PCP if SBP>160 or DBP > 90, taking all medications as prescribe, and adhering to a low sodium diet as discussed. patient will verbalize basic understanding of hypertension disease process and self health management plan as evidenced by compliance with medications, compliance with heart healthy/Ada diet, and working with the CCM team to optimize health and well being  Interventions:  Collaboration with Parks Ranger, Devonne Doughty, DO regarding development and update of comprehensive plan of care as evidenced by provider attestation and co-signature Inter-disciplinary care team collaboration (see longitudinal plan of care) Evaluation of current treatment plan related to hypertension self management and patient's adherence to plan as established by provider. 10-21-2020: The patient is doing well and denies any new concerns with her blood pressure. States it has been in better range.  Provided education to patient re: stroke prevention, s/s of heart attack and stroke, DASH diet, complications of uncontrolled blood pressure Reviewed medications with patient and discussed importance of compliance. 10-21-2020: The patient is compliant with medications at this time.  Discussed plans with patient for ongoing care management follow up and provided patient with direct contact information for care management team Advised patient, providing  education and rationale, to monitor blood pressure daily and record, calling PCP for findings outside established parameters.  Reviewed scheduled/upcoming provider appointments including: 11-11-2020 at 120 pm Self-Care Activities: - Self administers medications as prescribed Attends all scheduled provider appointments Calls provider office for new concerns, questions, or BP outside discussed parameters Checks BP and records as discussed Follows a low sodium diet/DASH diet Patient Goals: - check blood pressure 3 times per week - choose a place to take my blood pressure (home, clinic or office, retail store) - write blood pressure results in a log or diary - agree on reward when goals are met - agree to work together to make changes - ask questions to understand - have a family meeting to talk about healthy habits - learn about high blood pressure  Follow Up Plan:  Telephone follow up for 11-25-2020 at 1:45 pm    Care Plan : RNCM: Diabetes Type 2 (Adult)  Updates made by Vanita Ingles, RN since 10/21/2020 12:00 AM     Problem: RNCM: Glycemic Management (Diabetes, Type 2)   Priority: High     Long-Range Goal: RNCM: Glycemic Management Optimized   Start Date: 04/17/2020  Expected End Date: 07/20/2021  This Visit's Progress: On track  Recent Progress: On track  Priority: High  Note:   Objective:  Lab Results  Component Value Date   HGBA1C 8.4 (A) 07/01/2020    Lab Results  Component Value Date   CREATININE 0.93 02/13/2020   CREATININE 0.94 (H) 09/06/2019   CREATININE 1.23 (H) 08/08/2019    No results found for: EGFR Current Barriers:  Knowledge Deficits related to basic Diabetes pathophysiology and self care/management Knowledge Deficits related to medications used for management of diabetes- is not currently taking medications for diabetes  Does not use cbg meter  Limited Social Support Lacks social connections Does not contact provider office for  questions/concerns Case Manager Clinical Goal(s):  patient will demonstrate improved adherence to prescribed treatment plan for diabetes self care/management as evidenced by: daily monitoring and recording of CBG  adherence to ADA/ carb modified diet exercise 3/4 days/week adherence to prescribed medication regimen contacting provider for new or worsened symptoms or questions Interventions:  Collaboration with Olin Hauser, DO regarding development and update of comprehensive plan of care as evidenced by provider attestation and co-signature Inter-disciplinary care team collaboration (see longitudinal plan of care) Provided education to patient about basic DM disease process. 07-25-2020: The patient is having a hard time taking blood sugars at times due to her right hand having a "limp wrist", she has a brace ordered and hopes it will help. She has to write with her left hand and its hard for her. She states she is trying to eat better. 09-26-2020: Is still struggling with wrist and  has had nerve conduction study and did not get the news she was hoping for. The patient states that she is taking care of herself as best as she can. Denies any acute findings. Education and support given. 10-21-2020: The patient denies any issues with her DM management. Having issues with her hands and is not able to always check blood sugars. Denies any acute changes in blood sugars.  Discussed plans with patient for ongoing care management follow up and provided patient with direct contact information for care management team Reviewed scheduled/upcoming provider appointments including: 11-11-2020 at 1:20 pm Review of patient status, including review of consultants reports, relevant laboratory and other test results, and medications completed. Self-Care Activities - Attends all scheduled provider appointments Checks blood sugars as prescribed and utilize hyper and hypoglycemia protocol as needed Adheres to  prescribed ADA/carb modified Patient Goals: -barriers to adherence to treatment plan identified - blood glucose monitoring encouraged - blood glucose readings reviewed - resources required to improve adherence to care identified - self-awareness of signs/symptoms of hypo or hyperglycemia encouraged  Follow Up Plan:   Telephone follow up for 11-25-2020 at 1:45 pm    Care Plan : RNCM: Management of HLD  Updates made by Vanita Ingles, RN since 10/21/2020 12:00 AM     Problem: RNCM: HLD Management   Priority: Medium     Long-Range Goal: RNCM: HLD Management   Start Date: 04/17/2020  Expected End Date: 06/10/2021  This Visit's Progress: On track  Recent Progress: On track  Priority: Medium  Note:   Current Barriers:  Poorly controlled hyperlipidemia, complicated by DM and HTN Current antihyperlipidemic regimen: zocor 40 mg QD Most recent lipid panel:     Component Value Date/Time   CHOL 168 09/06/2019 0808   TRIG 173 (H) 09/06/2019 0808   HDL 66 09/06/2019 0808   CHOLHDL 2.5 09/06/2019 0808   VLDL 26 08/19/2016 0802   LDLCALC 75 09/06/2019 0808   ASCVD risk enhancing conditions: age >43, DM, HTN, CKD Lacks social connections Does not contact provider office  for questions/concerns RN Care Manager Clinical Goal(s):  patient will work with Consulting civil engineer, providers, and care team towards execution of optimized self-health management plan patient will verbalize understanding of plan for effective management of HLD  patient will work with [facility] and pcp  to address needs related to management of HLD  patient will attend all scheduled medical appointments: 11-11-2020 at 1:20 pm Interventions: Collaboration with Olin Hauser, DO regarding development and update of comprehensive plan of care as evidenced by provider attestation and co-signature Inter-disciplinary care team collaboration (see longitudinal plan of care) Medication review performed; medication list updated  in electronic medical record.  Inter-disciplinary care team collaboration (see longitudinal plan of care) Referred to pharmacy team for assistance with HLD medication management Evaluation of current treatment plan related to HLD  and patient's adherence to plan as established by provider. 07-25-2020: The patient is doing well and denies any new concerns with the management of her HLD. She is eating well and being more independent. The patient has started driving herself to the MD office appointments, the grocery store and to get her hair fixed. She is walking her dog daily and getting exercise. Is remaining safe in her environment. 10-21-2020: Doing well at this time. Denies any new issues with HLD health. Will continue to monitor.  Advised patient to call the office for changes or questions Provided education to patient re: heart healthy/ADA diet. 10-21-2020: endorses heart healthy/ADA diet Reviewed scheduled/upcoming provider appointments including: 11-11-2020 at 120 pm Discussed plans with patient for ongoing care management follow up and provided patient with direct contact information for care management team Patient Goals/Self-Care Activities: - call for medicine refill 2 or 3 days before it runs out - call if I am sick and can't take my medicine - keep a list of all the medicines I take; vitamins and herbals too - learn to read medicine labels - use a pillbox to sort medicine - use an alarm clock or phone to remind me to take my medicine - change to whole grain breads, cereal, pasta - drink 6 to 8 glasses of water each day - eat 3 to 5 servings of fruits and vegetables each day - eat 5 or 6 small meals each day - limit fast food meals to no more than 1 per week - manage portion size - prepare main meal at home 3 to 5 days each week - read food labels for fat, fiber, carbohydrates and portion size - be open to making changes - I can manage, know and watch for signs of a heart attack - if  I have chest pain, call for help - learn about small changes that will make a big difference - learn my personal risk factors - barriers to meeting goals identified - change-talk evoked - choices provided - collaboration with team encouraged - decision-making supported - difficulty of making life-long changes acknowledged - health risks reviewed - problem-solving facilitated - questions answered - readiness for change evaluated - reassurance provided - self-reflection promoted - self-reliance encouraged - verbalization of feelings encouraged Follow Up Plan:  Telephone follow up for 11-25-2020 at 1:45 pm      Care Plan : RNCM: Urinary Incontinence (Adult)/bowel Incontinence  Updates made by Vanita Ingles, RN since 10/21/2020 12:00 AM     Problem: RNCM: Symptom Management (Urinary Incontinence)   Priority: High     Long-Range Goal: RNCM: Urinary Incontinence Symptoms Manged   Start Date: 10/21/2020  Expected End Date: 10/21/2021  This  Visit's Progress: On track  Priority: High  Note:   Current Barriers:  Ineffective Self Health Maintenance in a patient with  urinary/bowel incontinence  Unable to independently manage urinary and bowel incontinence  Lacks social connections Does not contact provider office for questions/concerns Clinical Goal(s):  Collaboration with Parks Ranger, Devonne Doughty, DO regarding development and update of comprehensive plan of care as evidenced by provider attestation and co-signature Inter-disciplinary care team collaboration (see longitudinal plan of care) patient will work with care management team to address care coordination and chronic disease management needs related to Disease Management Educational Needs Care Coordination   Interventions:  Evaluation of current treatment plan related to  urinary and bowel incontinence  ,  self-management and patient's adherence to plan as established by provider. 10-21-2020: The patient has worked with a  schedule to take the miralax so she will know more about when to expect bowel movements. She says she is trying not to wear a pad all day but sometimes she has to do so. She feels she needs the area to get more air to it. She says at night she sometimes wakes up but she is already wet and this is irritating her skin. She has been using Aquaphor on it but she says that does not seem to help the "chaffing". Advised the patient to try using Eucerin cream or "butt cream" . The patient will try this and see if that is an improvement. She states she keeps herself clean but likely the reason her skin is so sensitive is the constant use of the bathroom and the irritation of the skin it causes. Will continue to monitor.  Collaboration with Olin Hauser, DO regarding development and update of comprehensive plan of care as evidenced by provider attestation       and co-signature Inter-disciplinary care team collaboration (see longitudinal plan of care) Discussed plans with patient for ongoing care management follow up and provided patient with direct contact information for care management team Self Care Activities:  Patient verbalizes understanding of plan to effectively manage urinary and bowel incontience  Self administers medications as prescribed Attends all scheduled provider appointments Performs ADL's independently Performs IADL's independently Calls provider office for new concerns or questions Patient Goals:  Follow Up Plan: Telephone follow up appointment with care management team member scheduled for: 11-25-2020 at 1:45 pm    Task: RNCM: Alleviate Barriers to Incontinence Treatment Completed 10/21/2020  Outcome: Positive  Note:   Care Management Activities:    - bladder-training plan developed - healthy lifestyle promoted - incontinence product use encouraged - medication side effects managed - medication-adherence assessment completed - participation in physical therapy  encouraged - practice of bladder-training techniques encouraged - practice of pelvic floor exercises encouraged - response to pharmacologic therapy monitored - strategies to manage constipation promoted - strategies to manage symptom triggers promoted - symptom triggers identified    Notes:      Plan:Telephone follow up appointment with care management team member scheduled for:  11-25-2020 at 145 pm  Noreene Larsson RN, MSN, [place] Mobile: [phone]

## 2020-10-21 NOTE — Patient Instructions (Signed)
Visit Information  PATIENT GOALS:  Goals Addressed             This Visit's Progress    RNCM: Keep Skin Clean and Dry       Follow Up Date 11/25/2020    - clean and dry skin well - keep skin dry - use a fragrance-free lotion on skin - wear a protective pad or garment    Why is this important?   Leaking urine (pee) can cause soreness from skin rashes and redness.  This is caused by skin being exposed to urine (pee).    10-21-2020: The patient states that she is keeping her self clean but her skin is irritated. Discussed the use of Eurecin cream and "butt cream". She has been using Aquaphor but feels it is not taking the " chaffing away". Education and support given. The patient states she is working on getting herself on a better schedule.      RNCM: Management of Chronic Constipation with Diarrhea       Management of Bowel regimen: Diarrhea/Constipation  Patient has been doing better with management of her chronic diarrhea and constipation.  The patient states that she is taking miralax daily and this is keeping her bowel movements soft. The patient is wearing a pad in case she has an accident and making sure she cleans herself good. She feels she is doing well at managing her care.   -patient to maintain independence with bowel habits -remain safe in environment.  -call the office for changes, questions or concerns  09-26-2020: The patient is doing well at managing her care at this time. No new concerns with bowel health. 10-21-2020: is using Miralax for more controlled bowel movement activity. Is having an issue with skin in private area. Education and support given to the patient today for things to try to alleviate irritated skin.      RNCM: Prevent Falls and Injury       Follow Up Date 11-25-2020   - add more outdoor lighting - always use handrails on the stairs - always wear low-heeled or flat shoes or slippers with nonskid soles - call the doctor if I am feeling too  drowsy - install bathroom grab bars - keep a flashlight by the bed - keep my cell phone with me always - learn how to get back up if I fall - make an emergency alert plan in case I fall - pick up clutter from the floors - use a nonslip pad with throw rugs, or remove them completely - use a cane or walker - use a nightlight in the bathroom - attend therapy    Why is this important?   Most falls happen when it is hard for you to walk safely. Your balance may be off because of an illness. You may have pain in your knees, hip or other joints.  You may be overly tired or taking medicines that make you sleepy. You may not be able to see or hear clearly.  Falls can lead to broken bones, bruises or other injuries.  There are things you can do to help prevent falling.     Notes: had a fall in December and surgery to repair in January. 07-25-2020: No new falls at this time. The patient is being safe. Denies any new issues with safety. Reviewed with the patient and discussed safety and fall prevention. 10-21-2020: Denies any new falls. Is getting slower but happy that she can take care of herself  and her dog. Will continue to monitor.         Patient verbalizes understanding of instructions provided today and agrees to view in [place].   Telephone follow up appointment with care management team member scheduled for: 11-25-2020 at 1:45 pm  [place], MSN, [place] Mobile: [phone]

## 2020-10-25 ENCOUNTER — Other Ambulatory Visit: Payer: Self-pay

## 2020-10-25 ENCOUNTER — Ambulatory Visit (INDEPENDENT_AMBULATORY_CARE_PROVIDER_SITE_OTHER): Payer: PPO

## 2020-10-25 VITALS — Wt 106.0 lb

## 2020-10-25 DIAGNOSIS — E538 Deficiency of other specified B group vitamins: Secondary | ICD-10-CM | POA: Diagnosis not present

## 2020-10-25 DIAGNOSIS — Z23 Encounter for immunization: Secondary | ICD-10-CM

## 2020-10-25 MED ORDER — CYANOCOBALAMIN 1000 MCG/ML IJ SOLN
1000.0000 ug | Freq: Once | INTRAMUSCULAR | Status: AC
  Administered 2020-10-25: 1000 ug via INTRAMUSCULAR

## 2020-10-28 DIAGNOSIS — R202 Paresthesia of skin: Secondary | ICD-10-CM | POA: Diagnosis not present

## 2020-10-28 DIAGNOSIS — G5631 Lesion of radial nerve, right upper limb: Secondary | ICD-10-CM | POA: Diagnosis not present

## 2020-10-28 DIAGNOSIS — E538 Deficiency of other specified B group vitamins: Secondary | ICD-10-CM | POA: Diagnosis not present

## 2020-10-28 DIAGNOSIS — R200 Anesthesia of skin: Secondary | ICD-10-CM | POA: Diagnosis not present

## 2020-11-04 ENCOUNTER — Other Ambulatory Visit: Payer: PPO

## 2020-11-04 ENCOUNTER — Other Ambulatory Visit: Payer: Self-pay

## 2020-11-04 DIAGNOSIS — I129 Hypertensive chronic kidney disease with stage 1 through stage 4 chronic kidney disease, or unspecified chronic kidney disease: Secondary | ICD-10-CM

## 2020-11-04 DIAGNOSIS — E063 Autoimmune thyroiditis: Secondary | ICD-10-CM | POA: Diagnosis not present

## 2020-11-04 DIAGNOSIS — E038 Other specified hypothyroidism: Secondary | ICD-10-CM | POA: Diagnosis not present

## 2020-11-04 DIAGNOSIS — N1831 Chronic kidney disease, stage 3a: Secondary | ICD-10-CM

## 2020-11-04 DIAGNOSIS — D631 Anemia in chronic kidney disease: Secondary | ICD-10-CM | POA: Diagnosis not present

## 2020-11-04 DIAGNOSIS — N183 Chronic kidney disease, stage 3 unspecified: Secondary | ICD-10-CM | POA: Diagnosis not present

## 2020-11-04 DIAGNOSIS — E1122 Type 2 diabetes mellitus with diabetic chronic kidney disease: Secondary | ICD-10-CM | POA: Diagnosis not present

## 2020-11-04 DIAGNOSIS — E538 Deficiency of other specified B group vitamins: Secondary | ICD-10-CM

## 2020-11-05 LAB — COMPLETE METABOLIC PANEL WITH GFR
AG Ratio: 1.8 (calc) (ref 1.0–2.5)
ALT: 10 U/L (ref 6–29)
AST: 18 U/L (ref 10–35)
Albumin: 4.2 g/dL (ref 3.6–5.1)
Alkaline phosphatase (APISO): 42 U/L (ref 37–153)
BUN/Creatinine Ratio: 17 (calc) (ref 6–22)
BUN: 20 mg/dL (ref 7–25)
CO2: 27 mmol/L (ref 20–32)
Calcium: 9.9 mg/dL (ref 8.6–10.4)
Chloride: 97 mmol/L — ABNORMAL LOW (ref 98–110)
Creat: 1.19 mg/dL — ABNORMAL HIGH (ref 0.60–0.95)
Globulin: 2.4 g/dL (calc) (ref 1.9–3.7)
Glucose, Bld: 141 mg/dL — ABNORMAL HIGH (ref 65–99)
Potassium: 4.4 mmol/L (ref 3.5–5.3)
Sodium: 131 mmol/L — ABNORMAL LOW (ref 135–146)
Total Bilirubin: 0.4 mg/dL (ref 0.2–1.2)
Total Protein: 6.6 g/dL (ref 6.1–8.1)
eGFR: 44 mL/min/{1.73_m2} — ABNORMAL LOW (ref >=60)

## 2020-11-05 LAB — HEMOGLOBIN A1C
Hgb A1c MFr Bld: 8 % of total Hgb — ABNORMAL HIGH (ref <5.7)
Mean Plasma Glucose: 183 mg/dL
eAG (mmol/L): 10.1 mmol/L

## 2020-11-05 LAB — CBC WITH DIFFERENTIAL/PLATELET
Absolute Monocytes: 408 cells/uL (ref 200–950)
Basophils Absolute: 71 cells/uL (ref 0–200)
Basophils Relative: 1.4 %
Eosinophils Absolute: 168 cells/uL (ref 15–500)
Eosinophils Relative: 3.3 %
HCT: 33 % — ABNORMAL LOW (ref 35.0–45.0)
Hemoglobin: 10.8 g/dL — ABNORMAL LOW (ref 11.7–15.5)
Lymphs Abs: 1591 cells/uL (ref 850–3900)
MCH: 30.7 pg (ref 27.0–33.0)
MCHC: 32.7 g/dL (ref 32.0–36.0)
MCV: 93.8 fL (ref 80.0–100.0)
MPV: 8.9 fL (ref 7.5–12.5)
Monocytes Relative: 8 %
Neutro Abs: 2861 cells/uL (ref 1500–7800)
Neutrophils Relative %: 56.1 %
Platelets: 303 10*3/uL (ref 140–400)
RBC: 3.52 10*6/uL — ABNORMAL LOW (ref 3.80–5.10)
RDW: 12.3 % (ref 11.0–15.0)
Total Lymphocyte: 31.2 %
WBC: 5.1 10*3/uL (ref 3.8–10.8)

## 2020-11-05 LAB — TSH: TSH: 6.6 mIU/L — ABNORMAL HIGH (ref 0.40–4.50)

## 2020-11-05 LAB — T4, FREE: Free T4: 1.2 ng/dL (ref 0.8–1.8)

## 2020-11-05 LAB — VITAMIN B12: Vitamin B-12: 642 pg/mL (ref 200–1100)

## 2020-11-11 ENCOUNTER — Other Ambulatory Visit: Payer: Self-pay

## 2020-11-11 ENCOUNTER — Ambulatory Visit (INDEPENDENT_AMBULATORY_CARE_PROVIDER_SITE_OTHER): Payer: PPO | Admitting: Family Medicine

## 2020-11-11 ENCOUNTER — Encounter: Payer: Self-pay | Admitting: Family Medicine

## 2020-11-11 ENCOUNTER — Ambulatory Visit: Payer: PPO

## 2020-11-11 VITALS — BP 148/58 | HR 71 | Ht 59.0 in | Wt 105.0 lb

## 2020-11-11 DIAGNOSIS — E538 Deficiency of other specified B group vitamins: Secondary | ICD-10-CM

## 2020-11-11 DIAGNOSIS — R251 Tremor, unspecified: Secondary | ICD-10-CM | POA: Diagnosis not present

## 2020-11-11 DIAGNOSIS — E063 Autoimmune thyroiditis: Secondary | ICD-10-CM | POA: Diagnosis not present

## 2020-11-11 DIAGNOSIS — N183 Chronic kidney disease, stage 3 unspecified: Secondary | ICD-10-CM

## 2020-11-11 DIAGNOSIS — G5631 Lesion of radial nerve, right upper limb: Secondary | ICD-10-CM

## 2020-11-11 DIAGNOSIS — E1122 Type 2 diabetes mellitus with diabetic chronic kidney disease: Secondary | ICD-10-CM

## 2020-11-11 DIAGNOSIS — N1831 Chronic kidney disease, stage 3a: Secondary | ICD-10-CM

## 2020-11-11 DIAGNOSIS — I129 Hypertensive chronic kidney disease with stage 1 through stage 4 chronic kidney disease, or unspecified chronic kidney disease: Secondary | ICD-10-CM

## 2020-11-11 DIAGNOSIS — E038 Other specified hypothyroidism: Secondary | ICD-10-CM | POA: Diagnosis not present

## 2020-11-11 DIAGNOSIS — D631 Anemia in chronic kidney disease: Secondary | ICD-10-CM | POA: Diagnosis not present

## 2020-11-11 MED ORDER — BACLOFEN 10 MG PO TABS: 5.0000 mg | ORAL_TABLET | Freq: Three times a day (TID) | ORAL | 1 refills | Status: DC | PRN

## 2020-11-11 MED ORDER — ATENOLOL 25 MG PO TABS
25.0000 mg | ORAL_TABLET | Freq: Every day | ORAL | 1 refills | Status: DC
Start: 2020-11-11 — End: 2021-03-14

## 2020-11-11 MED ORDER — LEVOTHYROXINE SODIUM 25 MCG PO TABS: 25.0000 ug | ORAL_TABLET | Freq: Every day | ORAL | 3 refills | Status: DC

## 2020-11-11 NOTE — Assessment & Plan Note (Signed)
Mildly elevated initial BP - Home BP readings reviewed  Complication with CKDIII   Plan:  1. Continue current BP regimen Lisinopril 5mg  daily - Add Atenolol see below for tremors/anxiety low dose 25mg  may need to adjust in future 2. Encourage improved lifestyle - low sodium diet, regular exercise 3. Continue monitor BP outside office, bring readings to next visit, if persistently >140/90 or new symptoms notify office sooner

## 2020-11-11 NOTE — Patient Instructions (Addendum)
Thank you for coming to the office today.  Vitamin B12 looks good up to 642, after last shot, we can just do the oral pill 1,000 mg per day  Iron level hemoglobin has improved. Keep on the iron pill daily.  For muscle spasm and tremors - try the muscle relaxant first  ONLY when you need it. Not every day.  Start taking Baclofen (Lioresal) 10mg  (muscle relaxant) - start with half (cut) to one whole pill at night as needed for next 1-3 nights (may make you drowsy, caution with driving) see how it affects you, then if tolerated increase to one pill 2 to 3 times a day or (every 8 hours as needed)  ------------------------  If the baclofen is not resolving, the tremors - then we should start on the DAILY low dose BP med Atenolol which can slow heart rate and anxiety and lower tremors. - take this every day consistently if we decide to take it and see if it helps.   Please schedule a Follow-up Appointment to: Return in about 4 months (around 03/14/2021) for 4 months Follow-up HTN / Tremors / Anxiety / Radial Nerve/Ortho.  If you have any other questions or concerns, please feel free to call the office or send a message through [place]. You may also schedule an earlier appointment if necessary.  Additionally, you may be receiving a survey about your experience at our office within a few days to 1 week by e-mail or mail. We value your feedback.  Nobie Putnam, DO [place]

## 2020-11-11 NOTE — Progress Notes (Addendum)
Subjective:    Patient ID: Lisa Mills, female    DOB: 01-Aug-1929, 87 y.o.   MRN: [id number]  Lisa Mills is a 87 y.o. female presenting on 11/11/2020 for Diabetes   HPI  Type 2 Diabetes CKD IIIa Anemia CKD HTN Followed by Dr Candiss Norse in past for CKD >3+ years ago Iron supplement daily has improved, Hemoglobin up to 10.8 A1c down to 8.0 now on last lab, previous 8.4 On Lisinopril 5mg  Not on sugar medications No hypoglycemia   Vitamin B12 Deficiency Vitamin B12 642 improved, has 1 more shot of B12 then will take oral dose  Hypothyroidism Last lab reviewed TSH >6 and T4 normal range. On Levothyroxine 45mcg daily  Vitamin D Deficiency On Vitamin D Supplement.  Anxiety vs Tremors See below concern with her nerves, she feels tremors on inside that radiate out at times with worse stress or being worked up and with trying to use wrist/muscles more. She does not want any strong or addictive medications.  Right Radial Nerve Palsy Neuropathy Has followed w/ [facility] Neuro Dr Manuella Ghazi, they did nerve conduction study and told her that she has weakened radial nerve palsy and neuropathy. She had history of fractured R forearm. last follow-up with [facility] saw Kerry Dory PA, she was given advice that may need hand specialist in [place]. However patient is requesting   NCS upper extremity (prognostication and localization) 09/09/2020 - This is an abnormal electrodiagnostic exam consistent with bilateral moderate (grade III) carpal tunnel syndrome (median nerve entrapment at wrist). 2) Right radial neuropathy distal to triceps innervation without any motor units, suggestive of poor prognosis for recovery  They recommended Neurosurgery [facility] referral but patient not interested in surgery.  She has a brace made by Dr Marcelino [facility]) she said this was to keep wrist neutral but it was difficult to use with velcro.  She has upcoming [place] PA Left middle finger pain and injury  due to trying to adjust her brace on L wrist.  She has upcoming PT appointment.  She has symptoms now of bilateral rotator cuff symptoms, flared up due to increased.  She has used [place] Dream cream, and would like to try Salonpas     Depression screen [facility] 2/9 11/11/2020 07/01/2020 11/28/2019  Decreased Interest 1 1 0  Down, Depressed, Hopeless 0 0 0  PHQ - 2 Score 1 1 0  Altered sleeping 0 0 -  Tired, decreased energy 0 1 -  Change in appetite 0 0 -  Feeling bad or failure about yourself  0 0 -  Trouble concentrating 0 0 -  Moving slowly or fidgety/restless 0 0 -  Suicidal thoughts 0 0 -  PHQ-9 Score 1 2 -  Difficult doing work/chores Not difficult at all Not difficult at all -  Some recent data might be hidden    Social History   Tobacco Use   Smoking status: Never   Smokeless tobacco: Never  Vaping Use   Vaping Use: Never used  Substance Use Topics   Alcohol use: Not Currently   Drug use: No    Review of Systems Per HPI unless specifically indicated above     Objective:    BP (!) 148/58 (BP Location: Left Arm, Patient Position: Sitting, Cuff Size: Normal)   Pulse 71   Ht 4\' 11"  (1.499 m)   Wt 105 lb (47.6 kg)   LMP  (LMP Unknown)   SpO2 97%   BMI 21.21 kg/m  Wt Readings from Last 3 Encounters:  11/11/20 105 lb (47.6 kg)  10/25/20 106 lb (48.1 kg)  09/27/20 106 lb (48.1 kg)    Physical Exam Vitals and nursing note reviewed.  Constitutional:      General: She is not in acute distress.    Appearance: She is well-developed. She is not diaphoretic.     Comments: Well-appearing, comfortable, cooperative  HENT:     Head: Normocephalic and atraumatic.  Eyes:     General:        Right eye: No discharge.        Left eye: No discharge.     Conjunctiva/sclera: Conjunctivae normal.  Neck:     Thyroid: No thyromegaly.  Cardiovascular:     Rate and Rhythm: Normal rate and regular rhythm.     Heart sounds: Normal heart sounds. No murmur  heard. Pulmonary:     Effort: Pulmonary effort is normal. No respiratory distress.     Breath sounds: Normal breath sounds. No wheezing or rales.  Musculoskeletal:        General: Normal range of motion.     Cervical back: Normal range of motion and neck supple.     Comments: Right forearm/wrist appears to have reduced muscle tone with radial nerve palsy  Lymphadenopathy:     Cervical: No cervical adenopathy.  Skin:    General: Skin is warm and dry.     Findings: No erythema or rash.  Neurological:     Mental Status: She is alert and oriented to person, place, and time.     Comments: No resting tremors.  Psychiatric:        Behavior: Behavior normal.     Comments: Well groomed, good eye contact, normal speech and thoughts    Diabetic Foot Exam - Simple   Simple Foot Form Diabetic Foot exam was performed with the following findings: Yes 11/11/2020  1:56 PM  Visual Inspection See comments: Yes Sensation Testing Intact to touch and monofilament testing bilaterally: Yes Pulse Check Posterior Tibialis and Dorsalis pulse intact bilaterally: Yes Comments Mild callus formation forefoot and heel. Intact monofilament sensation. Thickened toenails well trimmed.      Results for orders placed or performed in visit on 11/04/20  Vitamin B12  Result Value Ref Range   Vitamin B-12 642 200 - 1,100 pg/mL  CBC with Differential/Platelet  Result Value Ref Range   WBC 5.1 3.8 - 10.8 Thousand/uL   RBC 3.52 (L) 3.80 - 5.10 Million/uL   Hemoglobin 10.8 (L) 11.7 - 15.5 g/dL   HCT 33.0 (L) 35.0 - 45.0 %   MCV 93.8 80.0 - 100.0 fL   MCH 30.7 27.0 - 33.0 pg   MCHC 32.7 32.0 - 36.0 g/dL   RDW 12.3 11.0 - 15.0 %   Platelets 303 140 - 400 Thousand/uL   MPV 8.9 7.5 - 12.5 fL   Neutro Abs 2,861 1,500 - 7,800 cells/uL   Lymphs Abs 1,591 850 - 3,900 cells/uL   Absolute Monocytes 408 200 - 950 cells/uL   Eosinophils Absolute 168 15 - 500 cells/uL   Basophils Absolute 71 0 - 200 cells/uL    Neutrophils Relative % 56.1 %   Total Lymphocyte 31.2 %   Monocytes Relative 8.0 %   Eosinophils Relative 3.3 %   Basophils Relative 1.4 %  T4, free  Result Value Ref Range   Free T4 1.2 0.8 - 1.8 ng/dL  TSH  Result Value Ref Range   TSH 6.60 (H) 0.40 -  4.50 mIU/L  Hemoglobin A1c  Result Value Ref Range   Hgb A1c MFr Bld 8.0 (H) <5.7 % of total Hgb   Mean Plasma Glucose 183 mg/dL   eAG (mmol/L) 10.1 mmol/L  COMPLETE METABOLIC PANEL WITH GFR  Result Value Ref Range   Glucose, Bld 141 (H) 65 - 99 mg/dL   BUN 20 7 - 25 mg/dL   Creat 1.19 (H) 0.60 - 0.95 mg/dL   eGFR 44 (L) > OR = 60 mL/min/1.88m2   BUN/Creatinine Ratio 17 6 - 22 (calc)   Sodium 131 (L) 135 - 146 mmol/L   Potassium 4.4 3.5 - 5.3 mmol/L   Chloride 97 (L) 98 - 110 mmol/L   CO2 27 20 - 32 mmol/L   Calcium 9.9 8.6 - 10.4 mg/dL   Total Protein 6.6 6.1 - 8.1 g/dL   Albumin 4.2 3.6 - 5.1 g/dL   Globulin 2.4 1.9 - 3.7 g/dL (calc)   AG Ratio 1.8 1.0 - 2.5 (calc)   Total Bilirubin 0.4 0.2 - 1.2 mg/dL   Alkaline phosphatase (APISO) 42 37 - 153 U/L   AST 18 10 - 35 U/L   ALT 10 6 - 29 U/L      Assessment & Plan:   Problem List Items Addressed This Visit     Type 2 diabetes with stage 3 chronic kidney disease GFR 30-59 (HCC)    Improved A1c to 8.0 Prior 8.4 Complications - at risk hypoglycemia, nephropathy with CKD III-IV, also in setting HTN - Contraindicated - SGLT2. H/o failed Glyburide, Januvia. Not interested in injectable - Off metformin in past due to CKD - off januvia ineffective  Plan:  1. Remain off Sulfonylurea avoid hypoglycemia 2. Cannot take ASA due to CKD, continue statin, on ACEi 3. Encouraged lifestyle improvements with DM diet, continue walking      Hypothyroidism   Relevant Medications   levothyroxine (SYNTHROID) 25 MCG tablet   atenolol (TENORMIN) 25 MG tablet   Benign hypertension with CKD (chronic kidney disease) stage III (HCC)    Mildly elevated initial BP - Home BP readings  reviewed  Complication with CKDIII   Plan:  1. Continue current BP regimen Lisinopril 5mg  daily - Add Atenolol see below for tremors/anxiety low dose 25mg  may need to adjust in future 2. Encourage improved lifestyle - low sodium diet, regular exercise 3. Continue monitor BP outside office, bring readings to next visit, if persistently >140/90 or new symptoms notify office sooner      Relevant Medications   atenolol (TENORMIN) 25 MG tablet   Anemia in chronic kidney disease (CKD)    Improved hemoglobin but still overall low Followed by Nephro CKD-III underlying cause Will continue oral iron supplement      Other Visit Diagnoses     Vitamin B12 deficiency    -  Primary   Right radial nerve palsy       Relevant Medications   baclofen (LIORESAL) 10 MG tablet   Tremors of nervous system       Relevant Medications   atenolol (TENORMIN) 25 MG tablet      Hypothyroidism Labs reviewed mild elevated TSH but normal T4 Continue current dosage Levothyroxine 30mcg daily  Tremors vs Anxiety Question if related to actual anxiety or muscle/nerve tremors due to weakness in arms/forearm/wrist. Will trial Baclofen PRN usage to see if helps her wrist muscles for now. Also add Atenolol low dose 25mg  daily to help limit anxiety and tremors as well. May need both  Vitamin  B12 deficiency Improved on last lab 1 more dose injection B12 then oral dose B12 daily  Vitamin D deficiency On supplement    Consider Primidone   Meds ordered this encounter  Medications   levothyroxine (SYNTHROID) 25 MCG tablet    Sig: Take 1 tablet (25 mcg total) by mouth daily before breakfast.    Dispense:  90 tablet    Refill:  3    Add refills   atenolol (TENORMIN) 25 MG tablet    Sig: Take 1 tablet (25 mg total) by mouth daily. For tremors, anxiety    Dispense:  90 tablet    Refill:  1   baclofen (LIORESAL) 10 MG tablet    Sig: Take 0.5-1 tablets (5-10 mg total) by mouth 3 (three) times daily as  needed for muscle spasms.    Dispense:  90 each    Refill:  1      Follow up plan: Return in about 4 months (around 03/14/2021) for 4 months Follow-up HTN / Tremors / Anxiety / Radial Nerve/Ortho.   Nobie Putnam, [facility] 11/11/2020, 1:34 PM

## 2020-11-11 NOTE — Assessment & Plan Note (Signed)
Improved A1c to 8.0 Prior 8.4 Complications - at risk hypoglycemia, nephropathy with CKD III-IV, also in setting HTN - Contraindicated - SGLT2. H/o failed Glyburide, Januvia. Not interested in injectable - Off metformin in past due to CKD - off januvia ineffective  Plan:  1. Remain off Sulfonylurea avoid hypoglycemia 2. Cannot take ASA due to CKD, continue statin, on ACEi 3. Encouraged lifestyle improvements with DM diet, continue walking

## 2020-11-11 NOTE — Assessment & Plan Note (Signed)
Improved hemoglobin but still overall low Followed by Nephro CKD-III underlying cause Will continue oral iron supplement

## 2020-11-18 DIAGNOSIS — E1122 Type 2 diabetes mellitus with diabetic chronic kidney disease: Secondary | ICD-10-CM

## 2020-11-18 DIAGNOSIS — N1831 Chronic kidney disease, stage 3a: Secondary | ICD-10-CM

## 2020-11-18 DIAGNOSIS — E1169 Type 2 diabetes mellitus with other specified complication: Secondary | ICD-10-CM

## 2020-11-18 DIAGNOSIS — E785 Hyperlipidemia, unspecified: Secondary | ICD-10-CM | POA: Diagnosis not present

## 2020-11-18 DIAGNOSIS — I10 Essential (primary) hypertension: Secondary | ICD-10-CM | POA: Diagnosis not present

## 2020-11-22 ENCOUNTER — Other Ambulatory Visit: Payer: Self-pay

## 2020-11-22 ENCOUNTER — Ambulatory Visit (INDEPENDENT_AMBULATORY_CARE_PROVIDER_SITE_OTHER): Payer: PPO

## 2020-11-22 DIAGNOSIS — E538 Deficiency of other specified B group vitamins: Secondary | ICD-10-CM

## 2020-11-22 MED ORDER — CYANOCOBALAMIN 1000 MCG/ML IJ SOLN
1000.0000 ug | Freq: Once | INTRAMUSCULAR | Status: AC
  Administered 2020-11-22: 1000 ug via INTRAMUSCULAR

## 2020-11-22 MED ORDER — CYANOCOBALAMIN 1000 MCG/ML IJ SOLN: 1000.0000 ug | Freq: Once | INTRAMUSCULAR | Status: DC

## 2020-11-25 ENCOUNTER — Telehealth: Payer: PPO | Admitting: General Practice

## 2020-11-25 ENCOUNTER — Ambulatory Visit: Payer: PPO | Admitting: Urology

## 2020-11-25 ENCOUNTER — Ambulatory Visit (INDEPENDENT_AMBULATORY_CARE_PROVIDER_SITE_OTHER): Payer: PPO

## 2020-11-25 ENCOUNTER — Encounter: Payer: Self-pay | Admitting: Urology

## 2020-11-25 ENCOUNTER — Other Ambulatory Visit: Payer: Self-pay

## 2020-11-25 VITALS — BP 195/75 | HR 74 | Ht 59.0 in | Wt 110.0 lb

## 2020-11-25 DIAGNOSIS — N3020 Other chronic cystitis without hematuria: Secondary | ICD-10-CM

## 2020-11-25 DIAGNOSIS — R32 Unspecified urinary incontinence: Secondary | ICD-10-CM

## 2020-11-25 DIAGNOSIS — I129 Hypertensive chronic kidney disease with stage 1 through stage 4 chronic kidney disease, or unspecified chronic kidney disease: Secondary | ICD-10-CM

## 2020-11-25 DIAGNOSIS — E1169 Type 2 diabetes mellitus with other specified complication: Secondary | ICD-10-CM

## 2020-11-25 DIAGNOSIS — E785 Hyperlipidemia, unspecified: Secondary | ICD-10-CM

## 2020-11-25 DIAGNOSIS — I10 Essential (primary) hypertension: Secondary | ICD-10-CM

## 2020-11-25 DIAGNOSIS — R296 Repeated falls: Secondary | ICD-10-CM

## 2020-11-25 DIAGNOSIS — E1122 Type 2 diabetes mellitus with diabetic chronic kidney disease: Secondary | ICD-10-CM

## 2020-11-25 DIAGNOSIS — N183 Chronic kidney disease, stage 3 unspecified: Secondary | ICD-10-CM

## 2020-11-25 MED ORDER — NITROFURANTOIN MACROCRYSTAL 100 MG PO CAPS: 100.0000 mg | ORAL_CAPSULE | Freq: Every day | ORAL | 11 refills | Status: DC

## 2020-11-25 NOTE — Chronic Care Management (AMB) (Signed)
Chronic Care Management   CCM RN Visit Note  11/25/2020 Name: Lisa Mills MRN: [id number] DOB: 05-11-1929  Subjective: Lisa Mills is a 87 y.o. year old female who is a primary care patient of Olin Hauser, DO. The care management team was consulted for assistance with disease management and care coordination needs.    Engaged with patient by telephone for follow up visit in response to provider referral for case management and/or care coordination services.   Consent to Services:  The patient was given information about Chronic Care Management services, agreed to services, and gave verbal consent prior to initiation of services.  Please see initial visit note for detailed documentation.   Patient agreed to services and verbal consent obtained.   Assessment: Review of patient past medical history, allergies, medications, health status, including review of consultants reports, laboratory and other test data, was performed as part of comprehensive evaluation and provision of chronic care management services.   SDOH (Social Determinants of Health) assessments and interventions performed:    CCM Care Plan  Allergies  Allergen Reactions   Aspirin Other (See Comments)    Burns stomach   Conray [Iothalamate] Hives    IV dye conray-400   Dye Fdc Red [Red Dye] Hives   Sulfa Antibiotics     Unknown Reaction, not used in years   Sulfasalazine     Other reaction(s): Unknown   Prednisone     Indigestion     Outpatient Encounter Medications as of 11/25/2020  Medication Sig   acetaminophen (TYLENOL) 500 MG tablet Take 1 tablet (500 mg total) by mouth every 12 (twelve) hours.   atenolol (TENORMIN) 25 MG tablet Take 1 tablet (25 mg total) by mouth daily. For tremors, anxiety   baclofen (LIORESAL) 10 MG tablet Take 0.5-1 tablets (5-10 mg total) by mouth 3 (three) times daily as needed for muscle spasms.   Blood Glucose Monitoring Suppl ([place]) w/Device KIT  Use glucometer to check blood sugar daily.   Cholecalciferol (VITAMIN D) 50 MCG (2000 UT) CAPS Take 2,000 Units by mouth daily.    cyanocobalamin (,VITAMIN B-12,) 1000 MCG/ML injection Inject 1 mL (1,000 mcg total) into the muscle every 30 (thirty) days. For 3 months, then need repeat B12 lab level   escitalopram (LEXAPRO) 5 MG tablet TAKE 1 TABLET BY MOUTH ONCE DAILY WITH FOOD   ferrous sulfate 325 (65 FE) MG tablet Take 325 mg by mouth daily.    glucose blood (ONE TOUCH ULTRA TEST) test strip CHECK BLOOD SUGAR UP TO 2 TIMES A DAY.   glycerin adult 2 g suppository Place 1 suppository rectally as needed for constipation.   Histamine Dihydrochloride (AUSTRALIAN DREAM ARTHRITIS) 0.025 % CREA Apply 1 application topically daily as needed (Pain).   hydrocortisone (ANUSOL-HC) 2.5 % rectal cream USE 1 APPLICATION RECTALLY TWICE DAILY AS DIRECTED (Patient taking differently: Place 1 application rectally daily as needed for hemorrhoids or anal itching.)   levothyroxine (SYNTHROID) 25 MCG tablet Take 1 tablet (25 mcg total) by mouth daily before breakfast.   lisinopril (ZESTRIL) 5 MG tablet TAKE 1 TABLET BY MOUTH ONCE DAILY   nitrofurantoin (MACRODANTIN) 100 MG capsule Take 1 capsule (100 mg total) by mouth daily.   omeprazole (PRILOSEC) 20 MG capsule TAKE 1 CAPSULE BY MOUTH ONCE DAILY   OneTouch Delica Lancets 29U MISC 1 each by Does not apply route 3 (three) times daily. Dx:E11.9   polyethylene glycol (MIRALAX / GLYCOLAX) 17 g packet Take 17 g  by mouth daily as needed for moderate constipation or severe constipation. (Patient taking differently: Take 17 g by mouth daily.)   simvastatin (ZOCOR) 40 MG tablet Take 1 tablet (40 mg total) by mouth at bedtime.   traZODone (DESYREL) 50 MG tablet Take 0.5-1 tablets (25-50 mg total) by mouth at bedtime as needed for sleep. (Patient taking differently: Take 25 mg by mouth at bedtime as needed for sleep.)   witch hazel-glycerin (TUCKS) pad Apply 1 application  topically as needed for itching.   No facility-administered encounter medications on file as of 11/25/2020.    Patient Active Problem List   Diagnosis Date Noted   Right wrist drop 03/25/2020   Psychophysiological insomnia 10/06/2019   Nonintractable headache 10/06/2019   Urinary incontinence 10/06/2019   Internal and external bleeding hemorrhoids 08/15/2019   Sacral pain    Radicular pain of sacrum 07/11/2019   Sacral fracture, closed ([place]) 07/04/2019   Fall 07/04/2019   Bilateral pubic rami fractures, sequela 07/04/2019   Background diabetic retinopathy ([place]) 01/23/2019   Schatzki's ring 03/04/2017   Post herpetic neuralgia 10/06/2016   Hx of colonic polyps 09/22/2016   Osteopenia 09/02/2016   Anxiety associated with depression 08/25/2016   Trapezius muscle spasm 05/25/2016   Intermittent left lower quadrant abdominal pain 03/16/2016   Anemia in chronic kidney disease (CKD) 02/26/2016   Type 2 diabetes with stage 3 chronic kidney disease GFR 30-59 ([place]) 02/25/2016   Hypothyroidism 02/25/2016   Constipation 02/25/2016   GERD (gastroesophageal reflux disease) 02/25/2016   Hyperlipidemia associated with type 2 diabetes mellitus ([place]) 02/25/2016   DJD (degenerative joint disease) of cervical spine 02/25/2016   Osteoarthritis of multiple joints 02/25/2016   Hiatal hernia 02/25/2016   CKD stage 3 due to type 2 diabetes mellitus ([place]) 02/25/2016   Chronic kidney disease, unspecified 09/05/2013   Benign hypertension with CKD (chronic kidney disease) stage III ([place]) 09/05/2013   CMC arthritis, thumb, degenerative 06/15/2013   Onychomycosis due to dermatophyte 05/25/2013   Acquired keratoderma 05/25/2013    Conditions to be addressed/monitored:HTN, HLD, DMII, Overactive Bladder, and Fall and safety concerns  Care Plan : RNCM: Fall Risk (Adult)  Updates made by Vanita Ingles, RN since 11/25/2020 12:00 AM  Completed 11/25/2020   Problem: RNCM: Fall Risk Resolved 11/25/2020   Priority: High     Long-Range Goal: Absence of Fall and Fall-Related Injury Completed 11/25/2020  Start Date: 04/17/2020  Expected End Date: 03/22/2021  Recent Progress: On track  Priority: High  Note:   Current Barriers: resolving, duplicate goal Knowledge Deficits related to fall precautions in patient with multiple chronic conditions.  Decreased adherence to prescribed treatment for fall prevention Unable to independently manage falls as evidence of frequent falls in the home Lacks social connections Does not contact provider office for questions/concerns Chronic Disease Management support and education needs related to falls prevention in patient with impaired gait and mobility  Lacks caregiver support.  Clinical Goal(s):  patient will demonstrate improved adherence to prescribed treatment plan for decreasing falls as evidenced by patient reporting and review of EMR patient will verbalize using fall risk reduction strategies discussed patient will not experience additional falls patient will verbalize understanding of plan for safety in her home and preventing falls patient will work with [facility] and pcp to address needs related to falls prevention and safety in the home patient will attend all scheduled medical appointments: 11-11-2020 at 120 pm Interventions:  Collaboration with Olin Hauser, DO regarding development and update of comprehensive  plan of care as evidenced by provider attestation and co-signature Inter-disciplinary care team collaboration (see longitudinal plan of care) Provided written and verbal education re: Potential causes of falls and Fall prevention strategies Reviewed medications and discussed potential side effects of medications such as dizziness and frequent urination. 11-25-2020: The patient is compliant with medications- denies any issues with dizziness, does have to go to the bathroom quickly for bowel movements at times but she is wearing a pad for  protection now. Will continue to monitor.  Assessed for s/s of orthostatic hypotension Assessed for falls since last encounter. 07-25-2020: Denies any new falls. Likely will close this goal at next outreach. Patient has not had new falls >6 months. 11-25-2020: Denies any new falls. Is high fall risk. Will continue to monitor during outreaches. Education and support given.  Assessed patients knowledge of fall risk prevention secondary to previously provided education. 11-25-2020: The patient is very knowledgeable about safety and the high risk of falls. Is independent and thankful that she is able to care for herself.  Assessed working status of life alert bracelet and patient adherence Provided patient information for fall alert systems. 11-25-2020: Review of fall prevention and safety, especially since the patient walks her dog daily and has a history of falls. The patient verbalized that she is being mindful of her surroundings.  Evaluation of current treatment plan related to fall prevention and safety  and patient's adherence to plan as established by provider. 07-25-2020: The patient is improving and more active than usual. She is being independent and told her son not to come everyday that she would call if she needed him. She states she is being mindful of safety and monitoring for changes. Has a routine and this works well for her. Denies any new concerns at this time. 11-25-2020: Maintaining her independence. The patient states that she is slower but being careful and taking care of herself and her dog. Denies any new concerns. Advised patient to call the office for new concerns, new falls, or questions  Provided education to patient re: fall prevention and safety in the home Provided patient with fall prevention and safety  educational materials related to prevention of falls in the home by my chart platform Reviewed scheduled/upcoming provider appointments including: 01-27-2021 at 1:45 pm Discussed plans  with patient for ongoing care management follow up and provided patient with direct contact information for care management team Self-Care Deficits:  Unable to independently manage falls prevention and safety as evidence of fall with fracture to right humerus with repair on 02-13-2020 Lacks social connections Does not contact provider office for questions/concerns Patient Goals:  - Utilize walker and Cane(assistive device) appropriately with all ambulation - De-clutter walkways - Change positions slowly - Wear secure fitting shoes at all times with ambulation - Utilize home lighting for dim lit areas - Demonstrate self and pet awareness at all times - activities of daily living skills assessed - assistive or adaptive device use encouraged - barriers to physical activity or exercise addressed - barriers to physical activity or exercise identified - barriers to safety identified - cognition assessed - cognitive-stimulating activities promoted - fall prevention plan reviewed and updated - fear of falling, loss of independence and pain acknowledged - medication list reviewed - modification of home and work environment promoted - participation in rehabilitation therapy encouraged Follow Up Plan:  Telephone follow up for 01-27-2021 at 1:45 pm    Care Plan : RNCM: Hypertension (Adult)  Updates made by Vanita Ingles,  RN since 11/25/2020 12:00 AM  Completed 11/25/2020   Problem: RNCM: Hypertension (Hypertension) Resolved 11/25/2020  Priority: Medium     Long-Range Goal: RNCM: Hypertension Monitored Completed 11/25/2020  Start Date: 04/17/2020  Expected End Date: 03/22/2021  This Visit's Progress: On track  Recent Progress: On track  Priority: Medium  Note:   Objective: Resolving, duplicate goal Last practice recorded BP readings:  BP Readings from Last 3 Encounters:  11/25/20 (!) 195/75  11/11/20 (!) 148/58  09/02/20 123/72   Most recent eGFR/CrCl: No results found for: EGFR  No  components found for: CRCL Current Barriers:  Knowledge Deficits related to basic understanding of hypertension pathophysiology and self care management Knowledge Deficits related to understanding of medications prescribed for management of hypertension Limited Social Support Unable to independently manage HTN Lacks social connections Does not contact provider office for questions/concerns Case Manager Clinical Goal(s):  patient will verbalize understanding of plan for hypertension management patient will attend all scheduled medical appointments: 03-14-2021 at 1:20 pm patient will demonstrate improved adherence to prescribed treatment plan for hypertension as evidenced by taking all medications as prescribed, monitoring and recording blood pressure as directed, adhering to low sodium/DASH diet patient will demonstrate improved health management independence as evidenced by checking blood pressure as directed and notifying PCP if SBP>160 or DBP > 90, taking all medications as prescribe, and adhering to a low sodium diet as discussed. patient will verbalize basic understanding of hypertension disease process and self health management plan as evidenced by compliance with medications, compliance with heart healthy/Ada diet, and working with the CCM team to optimize health and well being  Interventions:  Collaboration with Parks Ranger, Devonne Doughty, DO regarding development and update of comprehensive plan of care as evidenced by provider attestation and co-signature Inter-disciplinary care team collaboration (see longitudinal plan of care) Evaluation of current treatment plan related to hypertension self management and patient's adherence to plan as established by provider. 11-25-2020: The patient is doing well and denies any new concerns with her blood pressure. States it has been in better range.  Provided education to patient re: stroke prevention, s/s of heart attack and stroke, DASH diet,  complications of uncontrolled blood pressure Reviewed medications with patient and discussed importance of compliance. 11-25-2020: The patient is compliant with medications at this time.  Discussed plans with patient for ongoing care management follow up and provided patient with direct contact information for care management team Advised patient, providing education and rationale, to monitor blood pressure daily and record, calling PCP for findings outside established parameters.  Reviewed scheduled/upcoming provider appointments including: 03-14-2021 at 120 pm Self-Care Activities: - Self administers medications as prescribed Attends all scheduled provider appointments Calls provider office for new concerns, questions, or BP outside discussed parameters Checks BP and records as discussed Follows a low sodium diet/DASH diet Patient Goals: - check blood pressure 3 times per week - choose a place to take my blood pressure (home, clinic or office, retail store) - write blood pressure results in a log or diary - agree on reward when goals are met - agree to work together to make changes - ask questions to understand - have a family meeting to talk about healthy habits - learn about high blood pressure  Follow Up Plan:  Telephone follow up for 01-27-2021 at 1:45 pm    Care Plan : RNCM: Diabetes Type 2 (Adult)  Updates made by Vanita Ingles, RN since 11/25/2020 12:00 AM  Completed 11/25/2020   Problem: RNCM: Glycemic Management (  Diabetes, Type 2) Resolved 11/25/2020  Priority: High     Long-Range Goal: RNCM: Glycemic Management Optimized Completed 11/25/2020  Start Date: 04/17/2020  Expected End Date: 07/20/2021  This Visit's Progress: On track  Recent Progress: On track  Priority: High  Note:   Objective: resolving, duplicate goal Lab Results  Component Value Date   HGBA1C 8.0 (H) 11/04/2020    Lab Results  Component Value Date   CREATININE 1.19 (H) 11/04/2020   CREATININE 0.93  02/13/2020   CREATININE 0.94 (H) 09/06/2019    No results found for: EGFR Current Barriers:  Knowledge Deficits related to basic Diabetes pathophysiology and self care/management Knowledge Deficits related to medications used for management of diabetes- is not currently taking medications for diabetes  Does not use cbg meter  Limited Social Support Lacks social connections Does not contact provider office for questions/concerns Case Manager Clinical Goal(s):  patient will demonstrate improved adherence to prescribed treatment plan for diabetes self care/management as evidenced by: daily monitoring and recording of CBG  adherence to ADA/ carb modified diet exercise 3/4 days/week adherence to prescribed medication regimen contacting provider for new or worsened symptoms or questions Interventions:  Collaboration with Olin Hauser, DO regarding development and update of comprehensive plan of care as evidenced by provider attestation and co-signature Inter-disciplinary care team collaboration (see longitudinal plan of care) Provided education to patient about basic DM disease process. 07-25-2020: The patient is having a hard time taking blood sugars at times due to her right hand having a "limp wrist", she has a brace ordered and hopes it will help. She has to write with her left hand and its hard for her. She states she is trying to eat better. 09-26-2020: Is still struggling with wrist and  has had nerve conduction study and did not get the news she was hoping for. The patient states that she is taking care of herself as best as she can. Denies any acute findings. Education and support given. 11-25-2020: The patient denies any issues with her DM management. Having issues with her hands and is not able to always check blood sugars. Denies any acute changes in blood sugars.  Discussed plans with patient for ongoing care management follow up and provided patient with direct contact information for  care management team Reviewed scheduled/upcoming provider appointments including: 03-14-2021 at 1:20 pm Review of patient status, including review of consultants reports, relevant laboratory and other test results, and medications completed. Self-Care Activities - Attends all scheduled provider appointments Checks blood sugars as prescribed and utilize hyper and hypoglycemia protocol as needed Adheres to prescribed ADA/carb modified Patient Goals: -barriers to adherence to treatment plan identified - blood glucose monitoring encouraged - blood glucose readings reviewed - resources required to improve adherence to care identified - self-awareness of signs/symptoms of hypo or hyperglycemia encouraged  Follow Up Plan:   Telephone follow up for 01-27-2021 at 1:45 pm    Care Plan : RNCM: Management of HLD  Updates made by Vanita Ingles, RN since 11/25/2020 12:00 AM  Completed 11/25/2020   Problem: RNCM: HLD Management Resolved 11/25/2020  Priority: Medium     Long-Range Goal: RNCM: HLD Management Completed 11/25/2020  Start Date: 04/17/2020  Expected End Date: 06/10/2021  This Visit's Progress: On track  Recent Progress: On track  Priority: Medium  Note:   Current Barriers: resolving, duplicate goal Poorly controlled hyperlipidemia, complicated by DM and HTN Current antihyperlipidemic regimen: zocor 40 mg QD Most recent lipid panel:  Component Value Date/Time   CHOL 168 09/06/2019 0808   TRIG 173 (H) 09/06/2019 0808   HDL 66 09/06/2019 0808   CHOLHDL 2.5 09/06/2019 0808   VLDL 26 08/19/2016 0802   LDLCALC 75 09/06/2019 0808   ASCVD risk enhancing conditions: age >27, DM, HTN, CKD Lacks social connections Does not contact provider office for questions/concerns RN Care Manager Clinical Goal(s):  patient will work with Consulting civil engineer, providers, and care team towards execution of optimized self-health management plan patient will verbalize understanding of plan for effective  management of HLD  patient will work with [facility] and pcp  to address needs related to management of HLD  patient will attend all scheduled medical appointments: 11-11-2020 at 1:20 pm Interventions: Collaboration with Olin Hauser, DO regarding development and update of comprehensive plan of care as evidenced by provider attestation and co-signature Inter-disciplinary care team collaboration (see longitudinal plan of care) Medication review performed; medication list updated in electronic medical record.  Inter-disciplinary care team collaboration (see longitudinal plan of care) Referred to pharmacy team for assistance with HLD medication management Evaluation of current treatment plan related to HLD  and patient's adherence to plan as established by provider. 07-25-2020: The patient is doing well and denies any new concerns with the management of her HLD. She is eating well and being more independent. The patient has started driving herself to the MD office appointments, the grocery store and to get her hair fixed. She is walking her dog daily and getting exercise. Is remaining safe in her environment. 11-25-2020: Doing well at this time. Denies any new issues with HLD health. Will continue to monitor.  Advised patient to call the office for changes or questions Provided education to patient re: heart healthy/ADA diet. 11-25-2020: endorses heart healthy/ADA diet Reviewed scheduled/upcoming provider appointments including: 03-14-2020 at 120 pm Discussed plans with patient for ongoing care management follow up and provided patient with direct contact information for care management team Patient Goals/Self-Care Activities: - call for medicine refill 2 or 3 days before it runs out - call if I am sick and can't take my medicine - keep a list of all the medicines I take; vitamins and herbals too - learn to read medicine labels - use a pillbox to sort medicine - use an alarm clock or phone to  remind me to take my medicine - change to whole grain breads, cereal, pasta - drink 6 to 8 glasses of water each day - eat 3 to 5 servings of fruits and vegetables each day - eat 5 or 6 small meals each day - limit fast food meals to no more than 1 per week - manage portion size - prepare main meal at home 3 to 5 days each week - read food labels for fat, fiber, carbohydrates and portion size - be open to making changes - I can manage, know and watch for signs of a heart attack - if I have chest pain, call for help - learn about small changes that will make a big difference - learn my personal risk factors - barriers to meeting goals identified - change-talk evoked - choices provided - collaboration with team encouraged - decision-making supported - difficulty of making life-long changes acknowledged - health risks reviewed - problem-solving facilitated - questions answered - readiness for change evaluated - reassurance provided - self-reflection promoted - self-reliance encouraged - verbalization of feelings encouraged Follow Up Plan:  Telephone follow up for 01-27-2021 at 1:45 pm  Care Plan : RNCM: Urinary Incontinence (Adult)/bowel Incontinence  Updates made by Vanita Ingles, RN since 11/25/2020 12:00 AM  Completed 11/25/2020   Problem: RNCM: Symptom Management (Urinary Incontinence) Resolved 11/25/2020  Priority: High     Long-Range Goal: RNCM: Urinary Incontinence Symptoms Manged Completed 11/25/2020  Start Date: 10/21/2020  Expected End Date: 10/21/2021  This Visit's Progress: On track  Recent Progress: On track  Priority: High  Note:   Current Barriers: resolving, duplicate goal  Ineffective Self Health Maintenance in a patient with  urinary/bowel incontinence  Unable to independently manage urinary and bowel incontinence  Lacks social connections Does not contact provider office for questions/concerns Clinical Goal(s):  Collaboration with Parks Ranger,  Devonne Doughty, DO regarding development and update of comprehensive plan of care as evidenced by provider attestation and co-signature Inter-disciplinary care team collaboration (see longitudinal plan of care) patient will work with care management team to address care coordination and chronic disease management needs related to Disease Management Educational Needs Care Coordination   Interventions:  Evaluation of current treatment plan related to  urinary and bowel incontinence  ,  self-management and patient's adherence to plan as established by provider. 10-21-2020: The patient has worked with a schedule to take the miralax so she will know more about when to expect bowel movements. She says she is trying not to wear a pad all day but sometimes she has to do so. She feels she needs the area to get more air to it. She says at night she sometimes wakes up but she is already wet and this is irritating her skin. She has been using Aquaphor on it but she says that does not seem to help the "chaffing". Advised the patient to try using Eucerin cream or "butt cream" . The patient will try this and see if that is an improvement. She states she keeps herself clean but likely the reason her skin is so sensitive is the constant use of the bathroom and the irritation of the skin it causes. Will continue to monitor.  Collaboration with Olin Hauser, DO regarding development and update of comprehensive plan of care as evidenced by provider attestation       and co-signature Inter-disciplinary care team collaboration (see longitudinal plan of care) Discussed plans with patient for ongoing care management follow up and provided patient with direct contact information for care management team Self Care Activities:  Patient verbalizes understanding of plan to effectively manage urinary and bowel incontience  Self administers medications as prescribed Attends all scheduled provider appointments Performs ADL's  independently Performs IADL's independently Calls provider office for new concerns or questions Patient Goals:  Follow Up Plan: Telephone follow up appointment with care management team member scheduled for: 11-25-2020 at 1:45 pm    Care Plan : RNCM: Adult plan of care for Chronic Disease Management and Care Coordination Needs  Updates made by Vanita Ingles, RN since 11/25/2020 12:00 AM     Problem: RNCM: Development of Plan of Care for Chronic Disease Management (HTN, HLD, DM, Falls, Urinary Incontinence)   Priority: High     Long-Range Goal: RNCM: Effective management  of Plan of Care for Chronic Disease Management (HTN, HLD, DM, Falls, Urinary Incontinence)   Start Date: 11/25/2020  Expected End Date: 11/25/2021  Priority: High  Note:   Current Barriers:  Chronic Disease Management support and education needs related to HTN, HLD, DMII, and Falls Prevention and Urinary Incontience   RNCM Clinical Goal(s):  Patient will verbalize understanding  of plan for management of HTN, HLD, DMII, Overactive Bladder, and urinary incontience  as evidenced by following plan of care, taking medications as directed, and dietary restrictions demonstrate understanding of rationale for each prescribed medication as evidenced by compliance with medications and calling for refills before running out of medications     attend all scheduled medical appointments: 01-27-2021 at 1:45 pm as evidenced by keeping appointments         demonstrate improved and ongoing adherence to prescribed treatment plan for HTN, HLD, DMII, Overactive Bladder, and falls prevention and safety as evidenced by compliance with the plan of care and working with the CCM team for effective management of chronic conditions  demonstrate a decrease in HTN, HLD, DMII, Overactive Bladder, and falls and safety exacerbations  as evidenced by effective management of chronic conditions and working with the CCM team for health and well being demonstrate  ongoing self health care management ability effective management of chronic conditions as evidenced by working with the CCM team through collaboration with Consulting civil engineer, provider, and care team.   Interventions: 1:1 collaboration with primary care provider regarding development and update of comprehensive plan of care as evidenced by provider attestation and co-signature Inter-disciplinary care team collaboration (see longitudinal plan of care) Evaluation of current treatment plan related to  self management and patient's adherence to plan as established by provider   SDOH Barriers (Status: Goal on Track (progressing): YES.) Long Term Goal  Patient interviewed and SDOH assessment performed        Patient interviewed and appropriate assessments performed Provided patient with information about resources available in [facility] and care guides being available for assistance for new needs or concerns Discussed plans with patient for ongoing care management follow up and provided patient with direct contact information for care management team Advised patient to to call the the office for changes for SDOH, questions, and concerns    Diabetes:  (Status: Goal on Track (progressing): YES.) Long Term Goal   Lab Results  Component Value Date   HGBA1C 8.0 (H) 11/04/2020  Assessed patient's understanding of A1c goal: <7% Provided education to patient about basic DM disease process; Reviewed medications with patient and discussed importance of medication adherence;        Reviewed prescribed diet with patient heart healthy/ADA diet ; Counseled on importance of regular laboratory monitoring as prescribed;        Discussed plans with patient for ongoing care management follow up and provided patient with direct contact information for care management team;      Provided patient with written educational materials related to hypo and hyperglycemia and importance of correct treatment;        Reviewed scheduled/upcoming provider appointments including: 03-14-2021 at 1:20 pm;         Advised patient, providing education and rationale, to check cbg as directed  and record        call provider for findings outside established parameters;       Review of patient status, including review of consultants reports, relevant laboratory and other test results, and medications completed;       Screening for signs and symptoms of depression related to chronic disease state;        Assessed social determinant of health barriers;         Falls:  (Status: Goal on Track (progressing): YES.) Long Term Goal  Provided written and verbal education re: potential causes of falls and Fall prevention strategies Reviewed medications  and discussed potential side effects of medications such as dizziness and frequent urination Advised patient of importance of notifying provider of falls Assessed for signs and symptoms of orthostatic hypotension Assessed for falls since last encounter Assessed patients knowledge of fall risk prevention secondary to previously provided education Provided patient information for fall alert systems  OAB/Urinary Incontinence   (Status: Goal on Track (progressing): YES.) Long Term Goal  Evaluation of current treatment plan related to Overactive Bladder,  self-management and patient's adherence to plan as established by provider. Discussed plans with patient for ongoing care management follow up and provided patient with direct contact information for care management team Advised patient to call the office for changes in urinary health, any sx or sx of infection or new concerns; Provided education to patient re: basic urinary healthy and management of urinary system ; Reviewed medications with patient and discussed compliance ; Reviewed scheduled/upcoming provider appointments including 03-14-2021 at 1:20 pm; Discussed plans with patient for ongoing care management follow up and  provided patient with direct contact information for care management team;  Hyperlipidemia:  (Status: Goal on Track (progressing): YES.) Lab Results  Component Value Date   CHOL 168 09/06/2019   HDL 66 09/06/2019   LDLCALC 75 09/06/2019   TRIG 173 (H) 09/06/2019   CHOLHDL 2.5 09/06/2019     Medication review performed; medication list updated in electronic medical record.  Provider established cholesterol goals reviewed; Counseled on importance of regular laboratory monitoring as prescribed; Provided HLD educational materials; Reviewed role and benefits of statin for ASCVD risk reduction; Discussed strategies to manage statin-induced myalgias; Reviewed importance of limiting foods high in cholesterol;  Hypertension: (Status: Goal on track: NO.) Last practice recorded BP readings:  BP Readings from Last 3 Encounters:  11/25/20 (!) 195/75  11/11/20 (!) 148/58  09/02/20 123/72  Most recent eGFR/CrCl:  Lab Results  Component Value Date   EGFR 44 (L) 11/04/2020    No components found for: CRCL  Evaluation of current treatment plan related to hypertension self management and patient's adherence to plan as established by provider;   Reviewed prescribed diet heart healthy/ADA diet  Reviewed medications with patient and discussed importance of compliance;  Discussed plans with patient for ongoing care management follow up and provided patient with direct contact information for care management team; Advised patient, providing education and rationale, to monitor blood pressure daily and record, calling PCP for findings outside established parameters;  Advised patient to discuss blood pressure trends  with provider; Provided education on prescribed diet heart healthy/ADA diet ;  Discussed complications of poorly controlled blood pressure such as heart disease, stroke, circulatory complications, vision complications, kidney impairment, sexual dysfunction;   Patient Goals/Self-Care  Activities: Patient will self administer medications as prescribed as evidenced by self report/primary caregiver report  Patient will attend all scheduled provider appointments as evidenced by clinician review of documented attendance to scheduled appointments and patient/caregiver report Patient will call pharmacy for medication refills as evidenced by patient report and review of pharmacy fill history as appropriate Patient will attend church or other social activities as evidenced by patient report Patient will continue to perform ADL's independently as evidenced by patient/caregiver report Patient will continue to perform IADL's independently as evidenced by patient/caregiver report Patient will call provider office for new concerns or questions as evidenced by review of documented incoming telephone call notes and patient report Patient will work with BSW to address care coordination needs and will continue to work with the clinical team to  address health care and disease management related needs as evidenced by documented adherence to scheduled care management/care coordination appointments - schedule appointment with eye doctor - check blood sugar at prescribed times: before meals and at bedtime, when you have symptoms of low or high blood sugar, and before and after exercise - check feet daily for cuts, sores or redness - enter blood sugar readings and medication or insulin into daily log - take the blood sugar log to all doctor visits - trim toenails straight across - drink 6 to 8 glasses of water each day - eat fish at least once per week - fill half of plate with vegetables - limit fast food meals to no more than 1 per week - manage portion size - prepare main meal at home 3 to 5 days each week - read food labels for fat, fiber, carbohydrates and portion size - reduce red meat to 2 to 3 times a week - keep feet up while sitting - wash and dry feet carefully every day - wear  comfortable, cotton socks - wear comfortable, well-fitting shoes - check blood pressure 3 times per week - choose a place to take my blood pressure (home, clinic or office, retail store) - write blood pressure results in a log or diary - learn about high blood pressure - keep a blood pressure log - take blood pressure log to all doctor appointments - call doctor for signs and symptoms of high blood pressure - develop an action plan for high blood pressure - keep all doctor appointments - take medications for blood pressure exactly as prescribed - report new symptoms to your doctor - eat more whole grains, fruits and vegetables, lean meats and healthy fats - call for medicine refill 2 or 3 days before it runs out - take all medications exactly as prescribed - call doctor with any symptoms you believe are related to your medicine - call doctor when you experience any new symptoms - go to all doctor appointments as scheduled - adhere to prescribed diet: heart healthy/ADA diet        Plan:Telephone follow up appointment with care management team member scheduled for:  01-27-2021 at 1:45 pm  Noreene Larsson RN, MSN, [place] Mobile: [phone]

## 2020-11-25 NOTE — Progress Notes (Signed)
11/25/2020 9:30 AM   Lisa Mills [birth year] [id number]  Referring provider: [name], DO [address] [place],  [place] [address]  Chief Complaint  Patient presents with   Cystitis    HPI: I reviewed the last lengthy note.  When I saw her 1 year ago she was infection free on Macrodantin.  She was almost completely continent except for foot on the floor syndrome and she held urination too long  Patient is infection free on Macrodantin.  I went over the package label risks with her in detail.  In my opinion is not related to her current healthcare conditions.  I gave her the option of stopping it.  She talked to neurology as well.  Frequency stable   PMH: Past Medical History:  Diagnosis Date   Anemia    Anxiety    Chronic kidney disease    stage 3-4   Constipation    Depression    Dermatophytosis of nail    Diabetes mellitus without complication (HCC)    Diet controlled, lost weight, no meds   GERD (gastroesophageal reflux disease)    Hemorrhoids    History of shingles    HLD (hyperlipidemia)    Hypertension    Hypothyroidism    Keratoderma    Restless legs syndrome (RLS)    hx    Surgical History: Past Surgical History:  Procedure Laterality Date   BREAST BIOPSY Right 09/30/2017   Affirm bx-calcs ( X clip),benign   BREAST EXCISIONAL BIOPSY Right    neg   cataracts     COLONOSCOPY W/ POLYPECTOMY     COLONOSCOPY WITH PROPOFOL N/A 02/10/2017   Procedure: COLONOSCOPY WITH PROPOFOL;  Surgeon: Scot Jun, MD;  Location[facility] ENDOSCOPY;  Service: Endoscopy;  Laterality: N/A;   ESOPHAGOGASTRODUODENOSCOPY (EGD) WITH PROPOFOL N/A 02/10/2017   Procedure: ESOPHAGOGASTRODUODENOSCOPY (EGD) WITH PROPOFOL;  Surgeon: Scot Jun, MD;  Location: [facility] ENDOSCOPY;  Service: Endoscopy;  Laterality: N/A;   EYE SURGERY     bilateral cataracts   HAND SURGERY     KIDNEY STONE SURGERY     ORIF HUMERUS FRACTURE Right 02/13/2020   Procedure: OPEN REDUCTION  INTERNAL FIXATION (ORIF) SUPRACONDYLAR  HUMERUS FRACTURE;  Surgeon: Myrene Galas, MD;  Location: [facility] OR;  Service: Orthopedics;  Laterality: Right;   SACROPLASTY N/A 07/06/2019   Procedure: SACROPLASTY;  Surgeon: Kennedy Bucker, MD;  Location: [facility] ORS;  Service: Orthopedics;  Laterality: N/A;   TONSILECTOMY, ADENOIDECTOMY, BILATERAL MYRINGOTOMY AND TUBES     TONSILLECTOMY     TRIGGER FINGER RELEASE     Left ring finger   tubercular peritonitis      Home Medications:  Allergies as of 11/25/2020       Reactions   Aspirin Other (See Comments)   Burns stomach   Conray [iothalamate] Hives   IV dye conray-400   Dye Fdc Red [red Dye] Hives   Sulfa Antibiotics    Unknown Reaction, not used in years   Sulfasalazine    Other reaction(s): Unknown   Prednisone    Indigestion         Medication List        Accurate as of November 25, 2020  9:30 AM. If you have any questions, ask your nurse or doctor.          acetaminophen 500 MG tablet Commonly known as: TYLENOL Take 1 tablet (500 mg total) by mouth every 12 (twelve) hours.   atenolol 25 MG tablet Commonly known as:  TENORMIN Take 1 tablet (25 mg total) by mouth daily. For tremors, anxiety   [place] Dream Arthritis 0.025 % Crea Generic drug: Histamine Dihydrochloride Apply 1 application topically daily as needed (Pain).   baclofen 10 MG tablet Commonly known as: LIORESAL Take 0.5-1 tablets (5-10 mg total) by mouth 3 (three) times daily as needed for muscle spasms.   cyanocobalamin 1000 MCG/ML injection Commonly known as: (VITAMIN B-12) Inject 1 mL (1,000 mcg total) into the muscle every 30 (thirty) days. For 3 months, then need repeat B12 lab level   escitalopram 5 MG tablet Commonly known as: LEXAPRO TAKE 1 TABLET BY MOUTH ONCE DAILY WITH FOOD   ferrous sulfate 325 (65 FE) MG tablet Take 325 mg by mouth daily.   glucose blood test strip Commonly known as: ONE TOUCH ULTRA TEST CHECK BLOOD SUGAR UP TO 2 TIMES A  DAY.   glycerin adult 2 g suppository Place 1 suppository rectally as needed for constipation.   hydrocortisone 2.5 % rectal cream Commonly known as: ANUSOL-HC USE 1 APPLICATION RECTALLY TWICE DAILY AS DIRECTED What changed: See the new instructions.   levothyroxine 25 MCG tablet Commonly known as: SYNTHROID Take 1 tablet (25 mcg total) by mouth daily before breakfast.   lisinopril 5 MG tablet Commonly known as: ZESTRIL TAKE 1 TABLET BY MOUTH ONCE DAILY   nitrofurantoin 100 MG capsule Commonly known as: Macrodantin Take 1 capsule (100 mg total) by mouth daily.   omeprazole 20 MG capsule Commonly known as: PRILOSEC TAKE 1 CAPSULE BY MOUTH ONCE DAILY   ONE TOUCH BASIC SYSTEM w/Device Kit Use glucometer to check blood sugar daily.   OneTouch Delica Lancets 16X Misc 1 each by Does not apply route 3 (three) times daily. Dx:E11.9   polyethylene glycol 17 g packet Commonly known as: MIRALAX / GLYCOLAX Take 17 g by mouth daily as needed for moderate constipation or severe constipation. What changed: when to take this   simvastatin 40 MG tablet Commonly known as: ZOCOR Take 1 tablet (40 mg total) by mouth at bedtime.   traZODone 50 MG tablet Commonly known as: DESYREL Take 0.5-1 tablets (25-50 mg total) by mouth at bedtime as needed for sleep. What changed: how much to take   Vitamin D 50 MCG (2000 UT) Caps Take 2,000 Units by mouth daily.   witch hazel-glycerin pad Commonly known as: TUCKS Apply 1 application topically as needed for itching.        Allergies:  Allergies  Allergen Reactions   Aspirin Other (See Comments)    Burns stomach   Conray [Iothalamate] Hives    IV dye conray-400   Dye Fdc Red [Red Dye] Hives   Sulfa Antibiotics     Unknown Reaction, not used in years   Sulfasalazine     Other reaction(s): Unknown   Prednisone     Indigestion     Family History: Family History  Adopted: Yes  Problem Relation Age of Onset   Breast cancer Neg  Hx     Social History:  reports that she has never smoked. She has never used smokeless tobacco. She reports that she does not currently use alcohol. She reports that she does not use drugs.  ROS:                                        Physical Exam: BP (!) 195/75   Pulse 74  Ht '4\' 11"'$  (1.499 m)   Wt 49.9 kg   LMP  (LMP Unknown)   BMI 22.22 kg/m   Constitutional:  Alert and oriented, No acute distress. HEENT: [place] AT, moist mucus membranes.  Trachea midline, no masses.   Laboratory Data: Lab Results  Component Value Date   WBC 5.1 11/04/2020   HGB 10.8 (L) 11/04/2020   HCT 33.0 (L) 11/04/2020   MCV 93.8 11/04/2020   PLT 303 11/04/2020    Lab Results  Component Value Date   CREATININE 1.19 (H) 11/04/2020    No results found for: PSA  No results found for: TESTOSTERONE  Lab Results  Component Value Date   HGBA1C 8.0 (H) 11/04/2020    Urinalysis    Component Value Date/Time   COLORURINE YELLOW 02/13/2020 0622   APPEARANCEUR CLEAR 02/13/2020 0622   APPEARANCEUR Hazy (A) 10/02/2019 1509   LABSPEC 1.008 02/13/2020 0622   PHURINE 6.0 02/13/2020 0622   GLUCOSEU NEGATIVE 02/13/2020 0622   HGBUR NEGATIVE 02/13/2020 0622   BILIRUBINUR NEGATIVE 02/13/2020 0622   BILIRUBINUR Negative 10/02/2019 1509   KETONESUR NEGATIVE 02/13/2020 0622   PROTEINUR NEGATIVE 02/13/2020 0622   UROBILINOGEN Normal 03/29/2017 0000   NITRITE NEGATIVE 02/13/2020 0622   LEUKOCYTESUR NEGATIVE 02/13/2020 0622    Pertinent Imaging:   Assessment & Plan: 90x3 of nitrofurantoin sent to pharmacy and I will see in 1 year  There are no diagnoses linked to this encounter.  No follow-ups on file.  Reece Packer, MD  [facility] [address] [place], [place] [address] [phone]

## 2020-11-25 NOTE — Patient Instructions (Addendum)
Visit Information.  Patient will self administer medications as prescribed as evidenced by self report/primary caregiver report  Patient will attend all scheduled provider appointments as evidenced by clinician review of documented attendance to scheduled appointments and patient/caregiver report Patient will call pharmacy for medication refills as evidenced by patient report and review of pharmacy fill history as appropriate Patient will attend church or other social activities as evidenced by patient report Patient will continue to perform ADL's independently as evidenced by patient/caregiver report Patient will continue to perform IADL's independently as evidenced by patient/caregiver report Patient will call provider office for new concerns or questions as evidenced by review of documented incoming telephone call notes and patient report Patient will work with BSW to address care coordination needs and will continue to work with the clinical team to address health care and disease management related needs as evidenced by documented adherence to scheduled care management/care coordination appointments - schedule appointment with eye doctor - check blood sugar at prescribed times: before meals and at bedtime, when you have symptoms of low or high blood sugar, and before and after exercise - check feet daily for cuts, sores or redness - enter blood sugar readings and medication or insulin into daily log - take the blood sugar log to all doctor visits - trim toenails straight across - drink 6 to 8 glasses of water each day - eat fish at least once per week - fill half of plate with vegetables - limit fast food meals to no more than 1 per week - manage portion size - prepare main meal at home 3 to 5 days each week - read food labels for fat, fiber, carbohydrates and portion size - reduce red meat to 2 to 3 times a week - keep feet up while sitting - wash and dry feet carefully every day -  wear comfortable, cotton socks - wear comfortable, well-fitting shoes - check blood pressure 3 times per week - choose a place to take my blood pressure (home, clinic or office, retail store) - write blood pressure results in a log or diary - learn about high blood pressure - keep a blood pressure log - take blood pressure log to all doctor appointments - call doctor for signs and symptoms of high blood pressure - develop an action plan for high blood pressure - keep all doctor appointments - take medications for blood pressure exactly as prescribed - report new symptoms to your doctor - eat more whole grains, fruits and vegetables, lean meats and healthy fats - call for medicine refill 2 or 3 days before it runs out - take all medications exactly as prescribed - call doctor with any symptoms you believe are related to your medicine - call doctor when you experience any new symptoms - go to all doctor appointments as scheduled - adhere to prescribed diet: heart healthy/ADA diet   Patient verbalizes understanding of instructions provided today and agrees to view in [place].   Telephone follow up appointment with care management team member scheduled for: 01-27-2021 at 1:45 pm   Noreene Larsson RN, MSN, [place] Mobile: [phone]

## 2020-11-26 ENCOUNTER — Other Ambulatory Visit: Payer: Self-pay | Admitting: Family Medicine

## 2020-11-26 DIAGNOSIS — E1122 Type 2 diabetes mellitus with diabetic chronic kidney disease: Secondary | ICD-10-CM

## 2020-11-26 NOTE — Telephone Encounter (Signed)
Call to pharmacy- they have Rx ready for pick up. Requested Prescriptions  Pending Prescriptions Disp Refills  . lisinopril (ZESTRIL) 5 MG tablet [Pharmacy Med Name: LISINOPRIL 5 MG TAB] 90 tablet 1    Sig: TAKE 1 TABLET BY MOUTH ONCE DAILY     Cardiovascular:  ACE Inhibitors Failed - 11/26/2020  9:30 AM      Failed - Cr in normal range and within 180 days    Creat  Date Value Ref Range Status  11/04/2020 1.19 (H) 0.60 - 0.95 mg/dL Final         Failed - Last BP in normal range    BP Readings from Last 1 Encounters:  11/25/20 (!) 195/75         Passed - K in normal range and within 180 days    Potassium  Date Value Ref Range Status  11/04/2020 4.4 3.5 - 5.3 mmol/L Final  06/28/2012 4.2 3.5 - 5.1 mmol/L Final         Passed - Patient is not pregnant      Passed - Valid encounter within last 6 months    Recent Outpatient Visits          2 weeks ago Vitamin B12 deficiency   [place], DO   2 months ago Vitamin B12 deficiency   [place], DO   2 months ago Vitamin B12 deficiency   [place], DO   2 months ago Vitamin B12 deficiency   [place], DO   3 months ago Vitamin B12 deficiency   [place], DO      Future Appointments            In 1 week  [facility], [place]   In 3 months [place], Devonne Doughty, [facility], [place]   In 1 year [place], Nicki Reaper, [facility]

## 2020-12-02 ENCOUNTER — Ambulatory Visit: Payer: PPO | Admitting: Urology

## 2020-12-02 DIAGNOSIS — M19042 Primary osteoarthritis, left hand: Secondary | ICD-10-CM | POA: Diagnosis not present

## 2020-12-02 DIAGNOSIS — G5603 Carpal tunnel syndrome, bilateral upper limbs: Secondary | ICD-10-CM | POA: Diagnosis not present

## 2020-12-02 DIAGNOSIS — M19041 Primary osteoarthritis, right hand: Secondary | ICD-10-CM | POA: Diagnosis not present

## 2020-12-02 DIAGNOSIS — M25542 Pain in joints of left hand: Secondary | ICD-10-CM | POA: Diagnosis not present

## 2020-12-02 DIAGNOSIS — G5631 Lesion of radial nerve, right upper limb: Secondary | ICD-10-CM | POA: Diagnosis not present

## 2020-12-02 DIAGNOSIS — M25531 Pain in right wrist: Secondary | ICD-10-CM | POA: Diagnosis not present

## 2020-12-02 DIAGNOSIS — M180 Bilateral primary osteoarthritis of first carpometacarpal joints: Secondary | ICD-10-CM | POA: Diagnosis not present

## 2020-12-02 DIAGNOSIS — M65332 Trigger finger, left middle finger: Secondary | ICD-10-CM | POA: Diagnosis not present

## 2020-12-03 ENCOUNTER — Other Ambulatory Visit: Payer: Self-pay | Admitting: Family Medicine

## 2020-12-03 ENCOUNTER — Ambulatory Visit: Payer: PPO

## 2020-12-03 DIAGNOSIS — F418 Other specified anxiety disorders: Secondary | ICD-10-CM

## 2020-12-03 NOTE — Telephone Encounter (Signed)
Requested Prescriptions  Pending Prescriptions Disp Refills  . escitalopram (LEXAPRO) 5 MG tablet [Pharmacy Med Name: ESCITALOPRAM OXALATE 5 MG TAB] 90 tablet 0    Sig: TAKE 1 TABLET BY MOUTH ONCE DAILY WITH FOOD     Psychiatry:  Antidepressants - SSRI Passed - 12/03/2020 11:23 AM      Passed - Completed PHQ-2 or PHQ-9 in the last 360 days      Passed - Valid encounter within last 6 months    Recent Outpatient Visits          3 weeks ago Vitamin B12 deficiency   [place], DO   2 months ago Vitamin B12 deficiency   [place], DO   2 months ago Vitamin B12 deficiency   [place], DO   3 months ago Vitamin B12 deficiency   [place], DO   3 months ago Vitamin B12 deficiency   [place], DO      Future Appointments            In 3 days  [facility], [place]   In 3 months [place], [facility], [place]   In 12 months [place], Nicki Reaper, [place]

## 2020-12-06 ENCOUNTER — Ambulatory Visit (INDEPENDENT_AMBULATORY_CARE_PROVIDER_SITE_OTHER): Payer: PPO

## 2020-12-06 ENCOUNTER — Other Ambulatory Visit: Payer: Self-pay

## 2020-12-06 VITALS — HR 78 | Temp 98.5°F | Resp 20 | Ht 59.0 in | Wt 109.6 lb

## 2020-12-06 DIAGNOSIS — Z0000 Encounter for general adult medical examination without abnormal findings: Secondary | ICD-10-CM | POA: Diagnosis not present

## 2020-12-06 NOTE — Patient Instructions (Signed)
Lisa Mills , Thank you for taking time to come for your Medicare Wellness Visit. I appreciate your ongoing commitment to your health goals. Please review the following plan we discussed and let me know if I can assist you in the future.   Screening recommendations/referrals: Colonoscopy: 02/10/17 Mammogram: 09/06/18 Bone Density: 09/02/16 Recommended yearly ophthalmology/optometry visit for glaucoma screening and checkup Recommended yearly dental visit for hygiene and checkup  Vaccinations: Influenza vaccine: 10/25/20 Pneumococcal vaccine: 08/25/16 Tdap vaccine: declined Shingles vaccine: 12/11/16, 08/28/17   Covid-19:03/26/19, 04/19/19, 12/19/19  Advanced directives: declined  Conditions/risks identified:   Next appointment: Follow up in one year for your annual wellness visit    Preventive Care 9 Years and Older, Female Preventive care refers to lifestyle choices and visits with your health care provider that can promote health and wellness. What does preventive care include? A yearly physical exam. This is also called an annual well check. Dental exams once or twice a year. Routine eye exams. Ask your health care provider how often you should have your eyes checked. Personal lifestyle choices, including: Daily care of your teeth and gums. Regular physical activity. Eating a healthy diet. Avoiding tobacco and drug use. Limiting alcohol use. Practicing safe sex. Taking low-dose aspirin every day. Taking vitamin and mineral supplements as recommended by your health care provider. What happens during an annual well check? The services and screenings done by your health care provider during your annual well check will depend on your age, overall health, lifestyle risk factors, and family history of disease. Counseling  Your health care provider may ask you questions about your: Alcohol use. Tobacco use. Drug use. Emotional well-being. Home and relationship well-being. Sexual  activity. Eating habits. History of falls. Memory and ability to understand (cognition). Work and work Statistician. Reproductive health. Screening  You may have the following tests or measurements: Height, weight, and BMI. Blood pressure. Lipid and cholesterol levels. These may be checked every 5 years, or more frequently if you are over 50 years old. Skin check. Lung cancer screening. You may have this screening every year starting at age 93 if you have a 30-pack-year history of smoking and currently smoke or have quit within the past 15 years. Fecal occult blood test (FOBT) of the stool. You may have this test every year starting at age 82. Flexible sigmoidoscopy or colonoscopy. You may have a sigmoidoscopy every 5 years or a colonoscopy every 10 years starting at age 1. Hepatitis C blood test. Hepatitis B blood test. Sexually transmitted disease (STD) testing. Diabetes screening. This is done by checking your blood sugar (glucose) after you have not eaten for a while (fasting). You may have this done every 1-3 years. Bone density scan. This is done to screen for osteoporosis. You may have this done starting at age 13. Mammogram. This may be done every 1-2 years. Talk to your health care provider about how often you should have regular mammograms. Talk with your health care provider about your test results, treatment options, and if necessary, the need for more tests. Vaccines  Your health care provider may recommend certain vaccines, such as: Influenza vaccine. This is recommended every year. Tetanus, diphtheria, and acellular pertussis (Tdap, Td) vaccine. You may need a Td booster every 10 years. Zoster vaccine. You may need this after age 66. Pneumococcal 13-valent conjugate (PCV13) vaccine. One dose is recommended after age 77. Pneumococcal polysaccharide (PPSV23) vaccine. One dose is recommended after age 61. Talk to your health care provider about which screenings  and vaccines  you need and how often you need them. This information is not intended to replace advice given to you by your health care provider. Make sure you discuss any questions you have with your health care provider. Document Released: 02/01/2015 Document Revised: 09/25/2015 Document Reviewed: 11/06/2014 Elsevier Interactive Patient Education  2017 Lisa Mills Prevention in the Home Falls can cause injuries. They can happen to people of all ages. There are many things you can do to make your home safe and to help prevent falls. What can I do on the outside of my home? Regularly fix the edges of walkways and driveways and fix any cracks. Remove anything that might make you trip as you walk through a door, such as a raised step or threshold. Trim any bushes or trees on the path to your home. Use bright outdoor lighting. Clear any walking paths of anything that might make someone trip, such as rocks or tools. Regularly check to see if handrails are loose or broken. Make sure that both sides of any steps have handrails. Any raised decks and porches should have guardrails on the edges. Have any leaves, snow, or ice cleared regularly. Use sand or salt on walking paths during winter. Clean up any spills in your garage right away. This includes oil or grease spills. What can I do in the bathroom? Use night lights. Install grab bars by the toilet and in the tub and shower. Do not use towel bars as grab bars. Use non-skid mats or decals in the tub or shower. If you need to sit down in the shower, use a plastic, non-slip stool. Keep the floor dry. Clean up any water that spills on the floor as soon as it happens. Remove soap buildup in the tub or shower regularly. Attach bath mats securely with double-sided non-slip rug tape. Do not have throw rugs and other things on the floor that can make you trip. What can I do in the bedroom? Use night lights. Make sure that you have a light by your bed that  is easy to reach. Do not use any sheets or blankets that are too big for your bed. They should not hang down onto the floor. Have a firm chair that has side arms. You can use this for support while you get dressed. Do not have throw rugs and other things on the floor that can make you trip. What can I do in the kitchen? Clean up any spills right away. Avoid walking on wet floors. Keep items that you use a lot in easy-to-reach places. If you need to reach something above you, use a strong step stool that has a grab bar. Keep electrical cords out of the way. Do not use floor polish or wax that makes floors slippery. If you must use wax, use non-skid floor wax. Do not have throw rugs and other things on the floor that can make you trip. What can I do with my stairs? Do not leave any items on the stairs. Make sure that there are handrails on both sides of the stairs and use them. Fix handrails that are broken or loose. Make sure that handrails are as long as the stairways. Check any carpeting to make sure that it is firmly attached to the stairs. Fix any carpet that is loose or worn. Avoid having throw rugs at the top or bottom of the stairs. If you do have throw rugs, attach them to the floor with carpet tape.  Make sure that you have a light switch at the top of the stairs and the bottom of the stairs. If you do not have them, ask someone to add them for you. What else can I do to help prevent falls? Wear shoes that: Do not have high heels. Have rubber bottoms. Are comfortable and fit you well. Are closed at the toe. Do not wear sandals. If you use a stepladder: Make sure that it is fully opened. Do not climb a closed stepladder. Make sure that both sides of the stepladder are locked into place. Ask someone to hold it for you, if possible. Clearly mark and make sure that you can see: Any grab bars or handrails. First and last steps. Where the edge of each step is. Use tools that help you  move around (mobility aids) if they are needed. These include: Canes. Walkers. Scooters. Crutches. Turn on the lights when you go into a dark area. Replace any light bulbs as soon as they burn out. Set up your furniture so you have a clear path. Avoid moving your furniture around. If any of your floors are uneven, fix them. If there are any pets around you, be aware of where they are. Review your medicines with your doctor. Some medicines can make you feel dizzy. This can increase your chance of falling. Ask your doctor what other things that you can do to help prevent falls. This information is not intended to replace advice given to you by your health care provider. Make sure you discuss any questions you have with your health care provider. Document Released: 11/01/2008 Document Revised: 06/13/2015 Document Reviewed: 02/09/2014 Elsevier Interactive Patient Education  2017 [employer].

## 2020-12-10 ENCOUNTER — Telehealth: Payer: PPO

## 2020-12-16 ENCOUNTER — Ambulatory Visit: Payer: PPO | Admitting: Occupational Therapy

## 2020-12-18 DIAGNOSIS — I129 Hypertensive chronic kidney disease with stage 1 through stage 4 chronic kidney disease, or unspecified chronic kidney disease: Secondary | ICD-10-CM | POA: Diagnosis not present

## 2020-12-18 DIAGNOSIS — N1831 Chronic kidney disease, stage 3a: Secondary | ICD-10-CM | POA: Diagnosis not present

## 2020-12-18 DIAGNOSIS — E785 Hyperlipidemia, unspecified: Secondary | ICD-10-CM | POA: Diagnosis not present

## 2020-12-18 DIAGNOSIS — E1169 Type 2 diabetes mellitus with other specified complication: Secondary | ICD-10-CM | POA: Diagnosis not present

## 2020-12-18 DIAGNOSIS — E1122 Type 2 diabetes mellitus with diabetic chronic kidney disease: Secondary | ICD-10-CM | POA: Diagnosis not present

## 2020-12-23 ENCOUNTER — Ambulatory Visit: Payer: PPO | Attending: Family Medicine | Admitting: Occupational Therapy

## 2020-12-23 DIAGNOSIS — G5631 Lesion of radial nerve, right upper limb: Secondary | ICD-10-CM | POA: Diagnosis not present

## 2020-12-23 DIAGNOSIS — M6281 Muscle weakness (generalized): Secondary | ICD-10-CM | POA: Diagnosis not present

## 2020-12-23 DIAGNOSIS — M25631 Stiffness of right wrist, not elsewhere classified: Secondary | ICD-10-CM | POA: Diagnosis not present

## 2020-12-23 NOTE — Therapy (Signed)
[facility] [address], [place], [address] Phone: [phone]   Fax:  [phone]  Occupational Therapy Treatment  Patient Details  Name: Lisa Mills MRN: [id number] Date of Birth: 09-17-1929 Referring Provider (OT): Dr Marcelino Scot   Encounter Date: 12/23/2020   OT End of Session - 12/23/20 1520     Visit Number 14    Number of Visits 17    Date for OT Re-Evaluation 02/17/21    OT Start Time 1345    OT Stop Time 1425    OT Time Calculation (min) 40 min    Activity Tolerance Patient tolerated treatment well    Behavior During Therapy [facility] for tasks assessed/performed             Past Medical History:  Diagnosis Date   Anemia    Anxiety    Chronic kidney disease    stage 3-4   Constipation    Depression    Dermatophytosis of nail    Diabetes mellitus without complication ([place])    Diet controlled, lost weight, no meds   GERD (gastroesophageal reflux disease)    Hemorrhoids    History of shingles    HLD (hyperlipidemia)    Hypertension    Hypothyroidism    Keratoderma    Restless legs syndrome (RLS)    hx    Past Surgical History:  Procedure Laterality Date   BREAST BIOPSY Right 09/30/2017   Affirm bx-calcs ( X clip),benign   BREAST EXCISIONAL BIOPSY Right    neg   cataracts     COLONOSCOPY W/ POLYPECTOMY     COLONOSCOPY WITH PROPOFOL N/A 02/10/2017   Procedure: COLONOSCOPY WITH PROPOFOL;  Surgeon: Manya Silvas, MD;  Location[facility] ENDOSCOPY;  Service: Endoscopy;  Laterality: N/A;   ESOPHAGOGASTRODUODENOSCOPY (EGD) WITH PROPOFOL N/A 02/10/2017   Procedure: ESOPHAGOGASTRODUODENOSCOPY (EGD) WITH PROPOFOL;  Surgeon: Manya Silvas, MD;  Location: [facility] ENDOSCOPY;  Service: Endoscopy;  Laterality: N/A;   EYE SURGERY     bilateral cataracts   HAND SURGERY     KIDNEY STONE SURGERY     ORIF HUMERUS FRACTURE Right 02/13/2020   Procedure: OPEN REDUCTION INTERNAL FIXATION (ORIF)  SUPRACONDYLAR  HUMERUS FRACTURE;  Surgeon: Altamese Herald, MD;  Location: [place];  Service: Orthopedics;  Laterality: Right;   SACROPLASTY N/A 07/06/2019   Procedure: SACROPLASTY;  Surgeon: Hessie Knows, MD;  Location: [facility] ORS;  Service: Orthopedics;  Laterality: N/A;   TONSILECTOMY, ADENOIDECTOMY, BILATERAL MYRINGOTOMY AND TUBES     TONSILLECTOMY     TRIGGER FINGER RELEASE     Left ring finger   tubercular peritonitis      There were no vitals filed for this visit.   Subjective Assessment - 12/23/20 1518     Subjective  I had a shot in my L middle finger for trigger finger - little better -but I am here for you to make my different splint for my R wrist - that other one is just to many straps ,and I don't want surgery for my radial N- my motion is about the same but I try and use my hand as much as I can    Pertinent History Pt fell and ended up with R supracondylar fx - ORIF done by Dr Marcelino Scot -pt also with radial N palsy - was suppose to have [facility] but never got it -and refer now by Dr Marcelino Scot to outpt OT on 04/03/20 - for ROM and possible radial N  palsy custom splint    Patient Stated Goals Want to use my R hand to eat, do my hair, makeup, open doors, cook and make bed as well as drive again    Currently in Pain? No/denies                AROM in R hand and wrist , elbow same as last time  Radial N - cont to have wrist drop, decrease MC extention and thumb RA  Discharge prefab radial nerve dynamic splint -and fabricated custom volar wrist splint this date - pt to wear with activities and night time   Pt able to demo donn and doff Pt to call if need adjustment Able to carry , grip  and push/pull door with better wrist support - able to make fist but unable to release fully because of decrease MC extention                   OT Education - 12/23/20 1520     Education Details splint wearing    Person(s) Educated Patient    Methods Explanation;Demonstration;Tactile  cues;Verbal cues;Handout    Comprehension Verbal cues required;Returned demonstration;Verbalized understanding              OT Short Term Goals - 06/12/20 1728       OT SHORT TERM GOAL #1   Title Pt to be independent in HEP to decrease tightness in flexors of R forearm and hand to initiate AROM for wrist and digits extention    Baseline Pt did not had any therapy since surgery - HH could not come - very tight and stiff -    Time 4    Period Weeks    Status Achieved               OT Long Term Goals - 12/23/20 1526       OT LONG TERM GOAL #4   Title R wrist AROM increase for pt to maintain wrist with grip to carry 1 lbs objects in ADL's    Baseline wrist drop -still present -  pt fitted this date with custom wrist splint - can push , pull doors , carry 5-7 lbs with splint on -    Time 8    Period Weeks    Status On-going    Target Date 02/17/21                   Plan - 12/23/20 1520     Clinical Impression Statement Pt is about 10 months s/p R supracondylar fx with ORIF and radial N palsy. Pt was seen  by OT in the past and fitted with radial N palsy prefab splint about 5 months ago that is light and universal fit.. Pt wore it but report just increase difficulty with velcro and trigger fingers in L hand , bilateral thumb CMC arthritis. DId had shot  last month for L 3rd digit trigger finger. She is trying to use her R hand as much as she can -but cont to have wrist drop, unable to extend [facility] and thumb RA - Fabricated custom volar wrist splint to use  during activities for stable wrist during grip and night time to decrease wrist flexor contracture. PT able to push , pull door , carry 5-7 lbs and fold laundry with splint. Pt able to makde full fist and thumb PA. Pt PROM for wrist extention and digits  WNL.Cont to be mostly limited for AROM for wrist  ext, digits MC extentionand thumb extention.  Pt to cont to wear splint and do her HEP - try and use her hand as much as  she can with splint and without - contact me if needed adjustments for splint.    OT Occupational Profile and History Problem Focused Assessment - Including review of records relating to presenting problem    Occupational performance deficits (Please refer to evaluation for details): ADL's;IADL's;Play;Rest and Sleep;Leisure;Social Participation    Body Structure / Function / Physical Skills ADL;Flexibility;ROM;UE functional use;FMC;Decreased knowledge of use of DME;Dexterity;Sensation;Strength;Pain;IADL    Rehab Potential Good    Clinical Decision Making Limited treatment options, no task modification necessary    Comorbidities Affecting Occupational Performance: None    Modification or Assistance to Complete Evaluation  No modification of tasks or assist necessary to complete eval    OT Frequency Monthly    OT Duration 8 weeks    OT Treatment/Interventions Self-care/ADL training;Moist Heat;Paraffin;DME and/or AE instruction;Manual Therapy;Passive range of motion;Scar mobilization;Splinting;Patient/family education;Therapeutic exercise    Consulted and Agree with Plan of Care Patient             Patient will benefit from skilled therapeutic intervention in order to improve the following deficits and impairments:   Body Structure / Function / Physical Skills: ADL, Flexibility, ROM, UE functional use, FMC, Decreased knowledge of use of DME, Dexterity, Sensation, Strength, Pain, IADL       Visit Diagnosis: Stiffness of right wrist, not elsewhere classified - Plan: Ot plan of care cert/re-cert  Muscle weakness (generalized) - Plan: Ot plan of care cert/re-cert  Compression of right radial nerve - Plan: Ot plan of care cert/re-cert    Problem List Patient Active Problem List   Diagnosis Date Noted   Right wrist drop 03/25/2020   Psychophysiological insomnia 10/06/2019   Nonintractable headache 10/06/2019   Urinary incontinence 10/06/2019   Internal and external bleeding  hemorrhoids 08/15/2019   Sacral pain    Radicular pain of sacrum 07/11/2019   Sacral fracture, closed ([place]) 07/04/2019   Fall 07/04/2019   Bilateral pubic rami fractures, sequela 07/04/2019   Background diabetic retinopathy ([place]) 01/23/2019   Schatzki's ring 03/04/2017   Post herpetic neuralgia 10/06/2016   Hx of colonic polyps 09/22/2016   Osteopenia 09/02/2016   Anxiety associated with depression 08/25/2016   Trapezius muscle spasm 05/25/2016   Intermittent left lower quadrant abdominal pain 03/16/2016   Anemia in chronic kidney disease (CKD) 02/26/2016   Type 2 diabetes with stage 3 chronic kidney disease GFR 30-59 ([place]) 02/25/2016   Hypothyroidism 02/25/2016   Constipation 02/25/2016   GERD (gastroesophageal reflux disease) 02/25/2016   Hyperlipidemia associated with type 2 diabetes mellitus ([place]) 02/25/2016   DJD (degenerative joint disease) of cervical spine 02/25/2016   Osteoarthritis of multiple joints 02/25/2016   Hiatal hernia 02/25/2016   CKD stage 3 due to type 2 diabetes mellitus ([place]) 02/25/2016   Chronic kidney disease, unspecified 09/05/2013   Benign hypertension with CKD (chronic kidney disease) stage III ([place]) 09/05/2013   CMC arthritis, thumb, degenerative 06/15/2013   Onychomycosis due to dermatophyte 05/25/2013   Acquired keratoderma 05/25/2013    Rosalyn Gess, OTR/L,CLT 12/23/2020, 3:30 PM  [facility] [address] [place], [address] Phone: [phone]   Fax:  [phone]  Name: KALLEY NICHOLL MRN: [id number] Date of Birth: [birth year]

## 2021-01-22 ENCOUNTER — Other Ambulatory Visit: Payer: Self-pay | Admitting: Family Medicine

## 2021-01-23 NOTE — Telephone Encounter (Signed)
Requested Prescriptions  Pending Prescriptions Disp Refills   omeprazole (PRILOSEC) 20 MG capsule [Pharmacy Med Name: OMEPRAZOLE DR 20 MG CAP] 90 capsule 1    Sig: TAKE 1 CAPSULE BY MOUTH ONCE DAILY     Gastroenterology: Proton Pump Inhibitors Passed - 01/22/2021 10:00 AM      Passed - Valid encounter within last 12 months    Recent Outpatient Visits          2 months ago Vitamin B12 deficiency   [place], DO   3 months ago Vitamin B12 deficiency   [place], DO   4 months ago Vitamin B12 deficiency   [place], DO   4 months ago Vitamin B12 deficiency   [place], DO   5 months ago Vitamin B12 deficiency   [facility] Parks Ranger, Devonne Doughty, DO      Future Appointments            In 1 month [place], Devonne Doughty, [facility], [place]   In 10 months [place], Nicki Reaper, [place]

## 2021-01-27 ENCOUNTER — Ambulatory Visit (INDEPENDENT_AMBULATORY_CARE_PROVIDER_SITE_OTHER): Payer: PPO

## 2021-01-27 ENCOUNTER — Telehealth: Payer: PPO

## 2021-01-27 DIAGNOSIS — R296 Repeated falls: Secondary | ICD-10-CM

## 2021-01-27 DIAGNOSIS — N3946 Mixed incontinence: Secondary | ICD-10-CM

## 2021-01-27 DIAGNOSIS — E1122 Type 2 diabetes mellitus with diabetic chronic kidney disease: Secondary | ICD-10-CM

## 2021-01-27 DIAGNOSIS — N1831 Type 2 diabetes mellitus with diabetic chronic kidney disease: Secondary | ICD-10-CM

## 2021-01-27 DIAGNOSIS — R32 Unspecified urinary incontinence: Secondary | ICD-10-CM

## 2021-01-27 DIAGNOSIS — G5631 Lesion of radial nerve, right upper limb: Secondary | ICD-10-CM

## 2021-01-27 DIAGNOSIS — M158 Other polyosteoarthritis: Secondary | ICD-10-CM

## 2021-01-27 DIAGNOSIS — E785 Hyperlipidemia, unspecified: Secondary | ICD-10-CM

## 2021-01-27 DIAGNOSIS — E1169 Type 2 diabetes mellitus with other specified complication: Secondary | ICD-10-CM

## 2021-01-27 DIAGNOSIS — I10 Essential (primary) hypertension: Secondary | ICD-10-CM

## 2021-01-27 NOTE — Chronic Care Management (AMB) (Signed)
Chronic Care Management   CCM RN Visit Note  01/27/2021 Name: Lisa Mills MRN: [id number] DOB: 24-Mar-1929  Subjective: [name] is a 88 y.o. year old female who is a primary care patient of [name], DO. The care management team was consulted for assistance with disease management and care coordination needs.    Engaged with patient by telephone for follow up visit in response to provider referral for case management and/or care coordination services.   Consent to Services:  The patient was given information about Chronic Care Management services, agreed to services, and gave verbal consent prior to initiation of services.  Please see initial visit note for detailed documentation.   Patient agreed to services and verbal consent obtained.   Assessment: Review of patient past medical history, allergies, medications, health status, including review of consultants reports, laboratory and other test data, was performed as part of comprehensive evaluation and provision of chronic care management services.   SDOH (Social Determinants of Health) assessments and interventions performed:    CCM Care Plan  Allergies  Allergen Reactions   Aspirin Other (See Comments)    Burns stomach   Conray [Iothalamate] Hives    IV dye conray-400   Dye Fdc Red [Red Dye] Hives   Sulfa Antibiotics     Unknown Reaction, not used in years   Sulfasalazine     Other reaction(s): Unknown   Prednisone     Indigestion     Outpatient Encounter Medications as of 01/27/2021  Medication Sig   acetaminophen (TYLENOL) 500 MG tablet Take 1 tablet (500 mg total) by mouth every 12 (twelve) hours. (Patient not taking: Reported on 12/06/2020)   atenolol (TENORMIN) 25 MG tablet Take 1 tablet (25 mg total) by mouth daily. For tremors, anxiety   baclofen (LIORESAL) 10 MG tablet Take 0.5-1 tablets (5-10 mg total) by mouth 3 (three) times daily as needed for muscle spasms. (Patient not taking: Reported  on 12/06/2020)   Blood Glucose Monitoring Suppl (ONE TOUCH BASIC SYSTEM) w/Device KIT Use glucometer to check blood sugar daily.   Cholecalciferol (VITAMIN D) 50 MCG (2000 UT) CAPS Take 2,000 Units by mouth daily.    cyanocobalamin (,VITAMIN B-12,) 1000 MCG/ML injection Inject 1 mL (1,000 mcg total) into the muscle every 30 (thirty) days. For 3 months, then need repeat B12 lab level   escitalopram (LEXAPRO) 5 MG tablet TAKE 1 TABLET BY MOUTH ONCE DAILY WITH FOOD   ferrous sulfate 325 (65 FE) MG tablet Take 325 mg by mouth daily.    glucose blood (ONE TOUCH ULTRA TEST) test strip CHECK BLOOD SUGAR UP TO 2 TIMES A DAY.   glycerin adult 2 g suppository Place 1 suppository rectally as needed for constipation.   Histamine Dihydrochloride (AUSTRALIAN DREAM ARTHRITIS) 0.025 % CREA Apply 1 application topically daily as needed (Pain).   hydrocortisone (ANUSOL-HC) 2.5 % rectal cream USE 1 APPLICATION RECTALLY TWICE DAILY AS DIRECTED (Patient taking differently: Place 1 application rectally daily as needed for hemorrhoids or anal itching.)   levothyroxine (SYNTHROID) 25 MCG tablet Take 1 tablet (25 mcg total) by mouth daily before breakfast.   lisinopril (ZESTRIL) 5 MG tablet TAKE 1 TABLET BY MOUTH ONCE DAILY   nitrofurantoin (MACRODANTIN) 100 MG capsule Take 1 capsule (100 mg total) by mouth daily.   omeprazole (PRILOSEC) 20 MG capsule TAKE 1 CAPSULE BY MOUTH ONCE DAILY   OneTouch Delica Lancets 33G MISC 1 each by Does not apply route 3 (three) times daily. Dx:E11.9  polyethylene glycol (MIRALAX / GLYCOLAX) 17 g packet Take 17 g by mouth daily as needed for moderate constipation or severe constipation. (Patient taking differently: Take 17 g by mouth daily.)   simvastatin (ZOCOR) 40 MG tablet Take 1 tablet (40 mg total) by mouth at bedtime.   traZODone (DESYREL) 50 MG tablet Take 0.5-1 tablets (25-50 mg total) by mouth at bedtime as needed for sleep. (Patient not taking: Reported on 12/06/2020)   witch  hazel-glycerin (TUCKS) pad Apply 1 application topically as needed for itching. (Patient not taking: Reported on 12/06/2020)   No facility-administered encounter medications on file as of 01/27/2021.    Patient Active Problem List   Diagnosis Date Noted   Right wrist drop 03/25/2020   Psychophysiological insomnia 10/06/2019   Nonintractable headache 10/06/2019   Urinary incontinence 10/06/2019   Internal and external bleeding hemorrhoids 08/15/2019   Sacral pain    Radicular pain of sacrum 07/11/2019   Sacral fracture, closed ([place]) 07/04/2019   Fall 07/04/2019   Bilateral pubic rami fractures, sequela 07/04/2019   Background diabetic retinopathy ([place]) 01/23/2019   Schatzki's ring 03/04/2017   Post herpetic neuralgia 10/06/2016   Hx of colonic polyps 09/22/2016   Osteopenia 09/02/2016   Anxiety associated with depression 08/25/2016   Trapezius muscle spasm 05/25/2016   Intermittent left lower quadrant abdominal pain 03/16/2016   Anemia in chronic kidney disease (CKD) 02/26/2016   Type 2 diabetes with stage 3 chronic kidney disease GFR 30-59 ([place]) 02/25/2016   Hypothyroidism 02/25/2016   Constipation 02/25/2016   GERD (gastroesophageal reflux disease) 02/25/2016   Hyperlipidemia associated with type 2 diabetes mellitus ([place]) 02/25/2016   DJD (degenerative joint disease) of cervical spine 02/25/2016   Osteoarthritis of multiple joints 02/25/2016   Hiatal hernia 02/25/2016   CKD stage 3 due to type 2 diabetes mellitus ([place]) 02/25/2016   Chronic kidney disease, unspecified 09/05/2013   Benign hypertension with CKD (chronic kidney disease) stage III ([place]) 09/05/2013   CMC arthritis, thumb, degenerative 06/15/2013   Onychomycosis due to dermatophyte 05/25/2013   Acquired keratoderma 05/25/2013    Conditions to be addressed/monitored:HTN, HLD, DMII, Overactive Bladder, and urinary incontinence, chronic pain, and falls  Care Plan : RNCM: Adult plan of care for Chronic Disease  Management and Care Coordination Needs  Updates made by Vanita Ingles, RN since 01/27/2021 12:00 AM     Problem: RNCM: Development of Plan of Care for Chronic Disease Management (HTN, HLD, DM, Falls, Chronic pain, Urinary Incontinence)   Priority: High     Long-Range Goal: RNCM: Effective management  of Plan of Care for Chronic Disease Management (HTN, HLD, DM, Falls, Chronic pain,  Urinary Incontinence)   Start Date: 11/25/2020  Expected End Date: 11/25/2021  Priority: High  Note:   Current Barriers:  Chronic Disease Management support and education needs related to HTN, HLD, DMII, Chronic pain, and Falls Prevention and Urinary Incontience   RNCM Clinical Goal(s):  Patient will verbalize understanding of plan for management of HTN, HLD, DMII, Chronic pain, falls prevention, Overactive Bladder, and urinary incontience  as evidenced by following plan of care, taking medications as directed, and dietary restrictions demonstrate understanding of rationale for each prescribed medication as evidenced by compliance with medications and calling for refills before running out of medications     attend all scheduled medical appointments: 03-14-2021 at 1:20 pm as evidenced by keeping appointments         demonstrate improved and ongoing adherence to prescribed treatment plan for HTN, HLD, DMII,  Chronic pain, Overactive Bladder, and falls prevention and safety as evidenced by compliance with the plan of care and working with the CCM team for effective management of chronic conditions  demonstrate a decrease in HTN, HLD, DMII, Overactive Bladder, Chronic pain, and falls and safety exacerbations  as evidenced by effective management of chronic conditions and working with the CCM team for health and well being demonstrate ongoing self health care management ability effective management of chronic conditions as evidenced by working with the CCM team through collaboration with Medical illustrator, provider, and care  team.   Interventions: 1:1 collaboration with primary care provider regarding development and update of comprehensive plan of care as evidenced by provider attestation and co-signature Inter-disciplinary care team collaboration (see longitudinal plan of care) Evaluation of current treatment plan related to  self management and patient's adherence to plan as established by provider   SDOH Barriers (Status: Goal on Track (progressing): YES.) Long Term Goal  Patient interviewed and SDOH assessment performed        Patient interviewed and appropriate assessments performed Provided patient with information about resources available in [facility] and care guides being available for assistance for new needs or concerns Discussed plans with patient for ongoing care management follow up and provided patient with direct contact information for care management team Advised patient to to call the the office for changes for SDOH, questions, and concerns. 01-27-2021: The patient is driving to her appointments and has help when needed for transportation needs    Diabetes:  (Status: Goal on Track (progressing): YES.) Long Term Goal   Lab Results  Component Value Date   HGBA1C 8.0 (H) 11/04/2020  Assessed patient's understanding of A1c goal: <7% Provided education to patient about basic DM disease process; Reviewed medications with patient and discussed importance of medication adherence. 01-27-2021: the patient takes medications as directed. ;        Reviewed prescribed diet with patient heart healthy/ADA diet. 01-27-2021: The patient states that she tries to watch what she is eating but sometimes she eats too much salt and other things she should not. She ate well over the holidays. The patient denies any episodes of shaking or being sweaty ; Counseled on importance of regular laboratory monitoring as prescribed. 01-27-2021: The patient will have more lab work at next follow up with the pcp. Education and  support given. ;        Discussed plans with patient for ongoing care management follow up and provided patient with direct contact information for care management team;      Provided patient with written educational materials related to hypo and hyperglycemia and importance of correct treatment;       Reviewed scheduled/upcoming provider appointments including: 03-14-2021 at 1:20 pm;         Advised patient, providing education and rationale, to check cbg as directed  and record. 01-27-2021: The patient has not been taking her blood sugars. It is hard for her to do with her right radius having wrist drop. She states she does not know what it will be but she is eating good and taking her medications. Education and support given.        call provider for findings outside established parameters;       Review of patient status, including review of consultants reports, relevant laboratory and other test results, and medications completed;       Screening for signs and symptoms of depression related to chronic disease state;  Assessed social determinant of health barriers;         Falls:  (Status: Goal on Track (progressing): YES.) Long Term Goal  Provided written and verbal education re: potential causes of falls and Fall prevention strategies Reviewed medications and discussed potential side effects of medications such as dizziness and frequent urination Advised patient of importance of notifying provider of falls Assessed for signs and symptoms of orthostatic hypotension Assessed for falls since last encounter. 01-27-2021: The patient denies any new falls. States she is being careful especially when she takes her dog out. She only takes him for a walk about one time a day.  Assessed patients knowledge of fall risk prevention secondary to previously provided education Provided patient information for fall alert systems  OAB/Urinary Incontinence   (Status: Goal on Track (progressing): YES.) Long Term  Goal  Evaluation of current treatment plan related to Overactive Bladder,  self-management and patient's adherence to plan as established by provider. 01-27-2021: the patient states her OAB/Urinary Incontinence is stable. She sometimes has to go quickly and has bowel movement/diarrhea at the same time. The patient states that she makes sure she cleans herself good and she has a herbal baby wipe that works well for her. She states that she feels she is managing this much better than what she was.  Discussed plans with patient for ongoing care management follow up and provided patient with direct contact information for care management team Advised patient to call the office for changes in urinary health, any sx or sx of infection or new concerns; Provided education to patient re: basic urinary healthy and management of urinary system ; Reviewed medications with patient and discussed compliance ; Reviewed scheduled/upcoming provider appointments including 03-14-2021 at 1:20 pm; Discussed plans with patient for ongoing care management follow up and provided patient with direct contact information for care management team;  Hyperlipidemia:  (Status: Goal on Track (progressing): YES.) Lab Results  Component Value Date   CHOL 168 09/06/2019   HDL 66 09/06/2019   LDLCALC 75 09/06/2019   TRIG 173 (H) 09/06/2019   CHOLHDL 2.5 09/06/2019     Medication review performed; medication list updated in electronic medical record.  Provider established cholesterol goals reviewed; Counseled on importance of regular laboratory monitoring as prescribed. 01-27-2021: Review of upcoming appointment and review of regular bloodwork for evaluation of cholesterol levels.  Provided HLD educational materials; Reviewed role and benefits of statin for ASCVD risk reduction; Discussed strategies to manage statin-induced myalgias; Reviewed importance of limiting foods high in cholesterol;  Hypertension: (Status: Goal on Track  (progressing): YES.) Last practice recorded BP readings:  BP Readings from Last 3 Encounters:  11/25/20 (!) 195/75  11/11/20 (!) 148/58  09/02/20 123/72  Most recent eGFR/CrCl:  Lab Results  Component Value Date   EGFR 44 (L) 11/04/2020    No components found for: CRCL  Evaluation of current treatment plan related to hypertension self management and patient's adherence to plan as established by provider. 01-27-2021: The patient has not been checking her blood pressures at home but denies headaches or other concerns related to HTN. The patient does state sometimes she eats too much sodium and should cut back on this. ;   Reviewed prescribed diet heart healthy/ADA diet. 01-27-2021: Review of heart healthy/ADA diet  Reviewed medications with patient and discussed importance of compliance. 01-27-2021: States compliance with medications   Discussed plans with patient for ongoing care management follow up and provided patient with direct contact information for care management  team; Advised patient, providing education and rationale, to monitor blood pressure daily and record, calling PCP for findings outside established parameters. 01-27-2021: Education on the goal of blood pressures being <017 systolic and <49 diastolic. The patient states that she will see the neurologist soon and pcp in February ;  Advised patient to discuss blood pressure trends  with provider; Provided education on prescribed diet heart healthy/ADA diet ;  Discussed complications of poorly controlled blood pressure such as heart disease, stroke, circulatory complications, vision complications, kidney impairment, sexual dysfunction;    Pain:  (Status: Goal on Track (progressing): YES.) Long Term Goal  Pain assessment performed. 01-27-2021: The patient rates her overall pain at a 2 today on a scale of 0-10. States that different areas have different pain and they are not all painful at the same time. She states that her right radius is  the same. She got a new splint but when she got it on 12-23-2020 the first thing she did was take it off because it is the hard plastic and it makes her wrist stay straight and she could not do anything with it. She states she is going to talk to the neurologist about it and see what recommendations the neurologist gives her. She states that she wishes there was something that would help it but she does not know what that will be. Empathetic listening and support given. Will continue to monitor.  Medications reviewed Reviewed provider established plan for pain management. 01-27-2021: the patient takes OTC medications when needed. She does not wish to take anything that she would become dependent on. She states she also has creams she uses when she needs to to help with pain relief.  Discussed importance of adherence to all scheduled medical appointments. 01-27-2021: Has upcoming appointment with neurologist and also sees the pcp in February. ; Counseled on the importance of reporting any/all new or changed pain symptoms or management strategies to pain management provider; Advised patient to report to care team affect of pain on daily activities; Discussed use of relaxation techniques and/or diversional activities to assist with pain reduction (distraction, imagery, relaxation, massage, acupressure, TENS, heat, and cold application; Reviewed with patient prescribed pharmacological and nonpharmacological pain relief strategies; Advised patient to discuss unresolved pain, changes in level or intensity of pain with provider; Screening for signs and symptoms of depression related to chronic disease state;  Assessed social determinant of health barriers;    Patient Goals/Self-Care Activities: Patient will self administer medications as prescribed as evidenced by self report/primary caregiver report  Patient will attend all scheduled provider appointments as evidenced by clinician review of documented attendance  to scheduled appointments and patient/caregiver report Patient will call pharmacy for medication refills as evidenced by patient report and review of pharmacy fill history as appropriate Patient will attend church or other social activities as evidenced by patient report Patient will continue to perform ADL's independently as evidenced by patient/caregiver report Patient will continue to perform IADL's independently as evidenced by patient/caregiver report Patient will call provider office for new concerns or questions as evidenced by review of documented incoming telephone call notes and patient report Patient will work with BSW to address care coordination needs and will continue to work with the clinical team to address health care and disease management related needs as evidenced by documented adherence to scheduled care management/care coordination appointments - schedule appointment with eye doctor - check blood sugar at prescribed times: before meals and at bedtime, when you have symptoms of  low or high blood sugar, and before and after exercise - check feet daily for cuts, sores or redness - enter blood sugar readings and medication or insulin into daily log - take the blood sugar log to all doctor visits - trim toenails straight across - drink 6 to 8 glasses of water each day - eat fish at least once per week - fill half of plate with vegetables - limit fast food meals to no more than 1 per week - manage portion size - prepare main meal at home 3 to 5 days each week - read food labels for fat, fiber, carbohydrates and portion size - reduce red meat to 2 to 3 times a week - keep feet up while sitting - wash and dry feet carefully every day - wear comfortable, cotton socks - wear comfortable, well-fitting shoes - check blood pressure 3 times per week - choose a place to take my blood pressure (home, clinic or office, retail store) - write blood pressure results in a log or diary -  learn about high blood pressure - keep a blood pressure log - take blood pressure log to all doctor appointments - call doctor for signs and symptoms of high blood pressure - develop an action plan for high blood pressure - keep all doctor appointments - take medications for blood pressure exactly as prescribed - report new symptoms to your doctor - eat more whole grains, fruits and vegetables, lean meats and healthy fats - call for medicine refill 2 or 3 days before it runs out - take all medications exactly as prescribed - call doctor with any symptoms you believe are related to your medicine - call doctor when you experience any new symptoms - go to all doctor appointments as scheduled - adhere to prescribed diet: heart healthy/ADA diet        Plan:Telephone follow up appointment with care management team member scheduled for:  03-24-2021 at 145 pm  Noreene Larsson RN, MSN, [facility] Mobile: [phone]

## 2021-01-27 NOTE — Patient Instructions (Signed)
Visit Information  Thank you for taking time to visit with me today. Please don't hesitate to contact me if I can be of assistance to you before our next scheduled telephone appointment.  Following are the goals we discussed today:  RNCM Clinical Goal(s):  Patient will verbalize understanding of plan for management of HTN, HLD, DMII, Chronic pain, falls prevention, Overactive Bladder, and urinary incontience  as evidenced by following plan of care, taking medications as directed, and dietary restrictions demonstrate understanding of rationale for each prescribed medication as evidenced by compliance with medications and calling for refills before running out of medications     attend all scheduled medical appointments: 03-14-2021 at 1:20 pm as evidenced by keeping appointments         demonstrate improved and ongoing adherence to prescribed treatment plan for HTN, HLD, DMII, Chronic pain, Overactive Bladder, and falls prevention and safety as evidenced by compliance with the plan of care and working with the CCM team for effective management of chronic conditions  demonstrate a decrease in HTN, HLD, DMII, Overactive Bladder, Chronic pain, and falls and safety exacerbations  as evidenced by effective management of chronic conditions and working with the CCM team for health and well being demonstrate ongoing self health care management ability effective management of chronic conditions as evidenced by working with the CCM team through collaboration with Consulting civil engineer, provider, and care team.    Interventions: 1:1 collaboration with primary care provider regarding development and update of comprehensive plan of care as evidenced by provider attestation and co-signature Inter-disciplinary care team collaboration (see longitudinal plan of care) Evaluation of current treatment plan related to  self management and patient's adherence to plan as established by provider     SDOH Barriers (Status: Goal on  Track (progressing): YES.) Long Term Goal  Patient interviewed and SDOH assessment performed        Patient interviewed and appropriate assessments performed Provided patient with information about resources available in [facility] and care guides being available for assistance for new needs or concerns Discussed plans with patient for ongoing care management follow up and provided patient with direct contact information for care management team Advised patient to to call the the office for changes for SDOH, questions, and concerns. 01-27-2021: The patient is driving to her appointments and has help when needed for transportation needs       Diabetes:  (Status: Goal on Track (progressing): YES.) Long Term Goal         Lab Results  Component Value Date    HGBA1C 8.0 (H) 11/04/2020  Assessed patient's understanding of A1c goal: <7% Provided education to patient about basic DM disease process; Reviewed medications with patient and discussed importance of medication adherence. 01-27-2021: the patient takes medications as directed. ;        Reviewed prescribed diet with patient heart healthy/ADA diet. 01-27-2021: The patient states that she tries to watch what she is eating but sometimes she eats too much salt and other things she should not. She ate well over the holidays. The patient denies any episodes of shaking or being sweaty ; Counseled on importance of regular laboratory monitoring as prescribed. 01-27-2021: The patient will have more lab work at next follow up with the pcp. Education and support given. ;        Discussed plans with patient for ongoing care management follow up and provided patient with direct contact information for care management team;      Provided patient  with written educational materials related to hypo and hyperglycemia and importance of correct treatment;       Reviewed scheduled/upcoming provider appointments including: 03-14-2021 at 1:20 pm;         Advised patient,  providing education and rationale, to check cbg as directed  and record. 01-27-2021: The patient has not been taking her blood sugars. It is hard for her to do with her right radius having wrist drop. She states she does not know what it will be but she is eating good and taking her medications. Education and support given.        call provider for findings outside established parameters;       Review of patient status, including review of consultants reports, relevant laboratory and other test results, and medications completed;       Screening for signs and symptoms of depression related to chronic disease state;        Assessed social determinant of health barriers;          Falls:  (Status: Goal on Track (progressing): YES.) Long Term Goal  Provided written and verbal education re: potential causes of falls and Fall prevention strategies Reviewed medications and discussed potential side effects of medications such as dizziness and frequent urination Advised patient of importance of notifying provider of falls Assessed for signs and symptoms of orthostatic hypotension Assessed for falls since last encounter. 01-27-2021: The patient denies any new falls. States she is being careful especially when she takes her dog out. She only takes him for a walk about one time a day.  Assessed patients knowledge of fall risk prevention secondary to previously provided education Provided patient information for fall alert systems   OAB/Urinary Incontinence   (Status: Goal on Track (progressing): YES.) Long Term Goal  Evaluation of current treatment plan related to Overactive Bladder,  self-management and patient's adherence to plan as established by provider. 01-27-2021: the patient states her OAB/Urinary Incontinence is stable. She sometimes has to go quickly and has bowel movement/diarrhea at the same time. The patient states that she makes sure she cleans herself good and she has a herbal baby wipe that works well  for her. She states that she feels she is managing this much better than what she was.  Discussed plans with patient for ongoing care management follow up and provided patient with direct contact information for care management team Advised patient to call the office for changes in urinary health, any sx or sx of infection or new concerns; Provided education to patient re: basic urinary healthy and management of urinary system ; Reviewed medications with patient and discussed compliance ; Reviewed scheduled/upcoming provider appointments including 03-14-2021 at 1:20 pm; Discussed plans with patient for ongoing care management follow up and provided patient with direct contact information for care management team;   Hyperlipidemia:  (Status: Goal on Track (progressing): YES.)      Lab Results  Component Value Date    CHOL 168 09/06/2019    HDL 66 09/06/2019    LDLCALC 75 09/06/2019    TRIG 173 (H) 09/06/2019    CHOLHDL 2.5 09/06/2019      Medication review performed; medication list updated in electronic medical record.  Provider established cholesterol goals reviewed; Counseled on importance of regular laboratory monitoring as prescribed. 01-27-2021: Review of upcoming appointment and review of regular bloodwork for evaluation of cholesterol levels.  Provided HLD educational materials; Reviewed role and benefits of statin for ASCVD risk reduction; Discussed strategies to manage  statin-induced myalgias; Reviewed importance of limiting foods high in cholesterol;   Hypertension: (Status: Goal on Track (progressing): YES.) Last practice recorded BP readings:     BP Readings from Last 3 Encounters:  11/25/20 (!) 195/75  11/11/20 (!) 148/58  09/02/20 123/72  Most recent eGFR/CrCl:       Lab Results  Component Value Date    EGFR 44 (L) 11/04/2020    No components found for: CRCL   Evaluation of current treatment plan related to hypertension self management and patient's adherence to  plan as established by provider. 01-27-2021: The patient has not been checking her blood pressures at home but denies headaches or other concerns related to HTN. The patient does state sometimes she eats too much sodium and should cut back on this. ;   Reviewed prescribed diet heart healthy/ADA diet. 01-27-2021: Review of heart healthy/ADA diet  Reviewed medications with patient and discussed importance of compliance. 01-27-2021: States compliance with medications   Discussed plans with patient for ongoing care management follow up and provided patient with direct contact information for care management team; Advised patient, providing education and rationale, to monitor blood pressure daily and record, calling PCP for findings outside established parameters. 01-27-2021: Education on the goal of blood pressures being <532 systolic and <99 diastolic. The patient states that she will see the neurologist soon and pcp in February ;  Advised patient to discuss blood pressure trends  with provider; Provided education on prescribed diet heart healthy/ADA diet ;  Discussed complications of poorly controlled blood pressure such as heart disease, stroke, circulatory complications, vision complications, kidney impairment, sexual dysfunction;      Pain:  (Status: Goal on Track (progressing): YES.) Long Term Goal  Pain assessment performed. 01-27-2021: The patient rates her overall pain at a 2 today on a scale of 0-10. States that different areas have different pain and they are not all painful at the same time. She states that her right radius is the same. She got a new splint but when she got it on 12-23-2020 the first thing she did was take it off because it is the hard plastic and it makes her wrist stay straight and she could not do anything with it. She states she is going to talk to the neurologist about it and see what recommendations the neurologist gives her. She states that she wishes there was something that would  help it but she does not know what that will be. Empathetic listening and support given. Will continue to monitor.  Medications reviewed Reviewed provider established plan for pain management. 01-27-2021: the patient takes OTC medications when needed. She does not wish to take anything that she would become dependent on. She states she also has creams she uses when she needs to to help with pain relief.  Discussed importance of adherence to all scheduled medical appointments. 01-27-2021: Has upcoming appointment with neurologist and also sees the pcp in February. ; Counseled on the importance of reporting any/all new or changed pain symptoms or management strategies to pain management provider; Advised patient to report to care team affect of pain on daily activities; Discussed use of relaxation techniques and/or diversional activities to assist with pain reduction (distraction, imagery, relaxation, massage, acupressure, TENS, heat, and cold application; Reviewed with patient prescribed pharmacological and nonpharmacological pain relief strategies; Advised patient to discuss unresolved pain, changes in level or intensity of pain with provider; Screening for signs and symptoms of depression related to chronic disease state;  Assessed social  determinant of health barriers;     Patient Goals/Self-Care Activities: Patient will self administer medications as prescribed as evidenced by self report/primary caregiver report  Patient will attend all scheduled provider appointments as evidenced by clinician review of documented attendance to scheduled appointments and patient/caregiver report Patient will call pharmacy for medication refills as evidenced by patient report and review of pharmacy fill history as appropriate Patient will attend church or other social activities as evidenced by patient report Patient will continue to perform ADL's independently as evidenced by patient/caregiver report Patient will  continue to perform IADL's independently as evidenced by patient/caregiver report Patient will call provider office for new concerns or questions as evidenced by review of documented incoming telephone call notes and patient report Patient will work with BSW to address care coordination needs and will continue to work with the clinical team to address health care and disease management related needs as evidenced by documented adherence to scheduled care management/care coordination appointments - schedule appointment with eye doctor - check blood sugar at prescribed times: before meals and at bedtime, when you have symptoms of low or high blood sugar, and before and after exercise - check feet daily for cuts, sores or redness - enter blood sugar readings and medication or insulin into daily log - take the blood sugar log to all doctor visits - trim toenails straight across - drink 6 to 8 glasses of water each day - eat fish at least once per week - fill half of plate with vegetables - limit fast food meals to no more than 1 per week - manage portion size - prepare main meal at home 3 to 5 days each week - read food labels for fat, fiber, carbohydrates and portion size - reduce red meat to 2 to 3 times a week - keep feet up while sitting - wash and dry feet carefully every day - wear comfortable, cotton socks - wear comfortable, well-fitting shoes - check blood pressure 3 times per week - choose a place to take my blood pressure (home, clinic or office, retail store) - write blood pressure results in a log or diary - learn about high blood pressure - keep a blood pressure log - take blood pressure log to all doctor appointments - call doctor for signs and symptoms of high blood pressure - develop an action plan for high blood pressure - keep all doctor appointments - take medications for blood pressure exactly as prescribed - report new symptoms to your doctor - eat more whole grains,  fruits and vegetables, lean meats and healthy fats - call for medicine refill 2 or 3 days before it runs out - take all medications exactly as prescribed - call doctor with any symptoms you believe are related to your medicine - call doctor when you experience any new symptoms - go to all doctor appointments as scheduled - adhere to prescribed diet: heart healthy/ADA diet       Our next appointment is by telephone on 03-24-2021 at 145 pm  Please call the care guide team at [phone] if you need to cancel or reschedule your appointment.   If you are experiencing a Mental Health or [place] or need someone to talk to, please call the Suicide and Crisis Lifeline: 988 call the [place] National Suicide Prevention Lifeline: 617-028-2721 or TTY: 915-266-4478 TTY (765) 593-5853) to talk to a trained counselor call 1-800-273-TALK (toll free, 24 hour hotline)   Patient verbalizes understanding of instructions provided today and  agrees to view in [place].   Noreene Larsson RN, MSN, [facility] [place] [facility] Mobile: [phone]

## 2021-02-12 ENCOUNTER — Telehealth: Payer: Self-pay

## 2021-02-12 NOTE — Telephone Encounter (Signed)
11 am.  Phone call made to patient to complete a check-in and offer a home visit.  No answer.  Message has been left requesting a call back.

## 2021-02-13 ENCOUNTER — Other Ambulatory Visit: Payer: PPO

## 2021-02-13 ENCOUNTER — Other Ambulatory Visit: Payer: Self-pay

## 2021-02-13 DIAGNOSIS — Z515 Encounter for palliative care: Secondary | ICD-10-CM

## 2021-02-13 NOTE — Progress Notes (Signed)
PATIENT NAME: Lisa Mills DOB: [birth year] MRN: [id number]  PRIMARY CARE PROVIDER: Olin Hauser, DO  RESPONSIBLE PARTY:  Acct ID - Guarantor Home Phone Work Phone Relationship Acct Type  [id number] Lisa Mills, Lisa [phone]  Self P/F     [place], [place], [place] [address]   Due to the COVID-19 crisis, this visit was done via telemedicine from my office and it was initiated and consent by this patient and or family.  I connected with  Lisa Mills OR PROXY on 02/13/21 by telephone and verified that I am speaking with the correct person using two identifiers.   I discussed the limitations of evaluation and management by telemedicine. The patient expressed understanding and agreed to proceed.   PLAN OF CARE and INTERVENTIONS:               1.  GOALS OF CARE/ ADVANCE CARE PLANNING: To remain home and independent with the assistance of her son.               2.  PATIENT/CAREGIVER EDUCATION:  Palliative Care               4. PERSONAL EMERGENCY PLAN:  Activate 911 for emergencies.               5.  DISEASE STATUS:  Connected by phone with patient to complete a check-in.  Patient states she has been doing well overall.  She is more mobile and able to walk her dog again.  She continues to have issues with her wrist due to a previous fracture.  Patient endorses working with therapy to improve her wrist mobility.  She has also started seeing a neurologist.  She continues to be followed by Chronic Care Management.  We discussed purpose of Palliative Care and services offered.  Patient would like to discharge from services as she does not feel it is needed at this time.  Advised that if we could be of assistance in the future she could contact her office or request her PCP send a referral in.  Patient verbalized understanding.  Phone call made to PCP office to advise of patient discharge from [facility] effective today.    HISTORY OF PRESENT ILLNESS:  [name] is a  pleasant 88 year old female with past medical hx of CKD Stage 3, Hemorrhoids, GERD, DM, Hypothyroidism, Hyperlipidemia, and DJD.  CODE STATUS: Full ADVANCED DIRECTIVES: No MOST FORM: No PPS: 50%         Lisa Burton, RN

## 2021-02-14 ENCOUNTER — Telehealth: Payer: Self-pay

## 2021-02-14 NOTE — Telephone Encounter (Signed)
Copied from [place] [id number]. Topic: General - Other >> Feb 13, 2021 11:04 AM Loma [place] wrote: Lisa Mills / caller Lisa Mills has called stating pt is discharging today from Palliative care as patient thinks that she is doing well enough that she no longer needs their care. Lisa Mills with [facility] is wanting to put PCP on notice that they are discharging  pt today . Questions to Almyra Free would be @ [phone]

## 2021-02-18 DIAGNOSIS — E785 Hyperlipidemia, unspecified: Secondary | ICD-10-CM | POA: Diagnosis not present

## 2021-02-18 DIAGNOSIS — E1169 Type 2 diabetes mellitus with other specified complication: Secondary | ICD-10-CM

## 2021-02-18 DIAGNOSIS — N1831 Chronic kidney disease, stage 3a: Secondary | ICD-10-CM

## 2021-02-18 DIAGNOSIS — M158 Other polyosteoarthritis: Secondary | ICD-10-CM | POA: Diagnosis not present

## 2021-02-18 DIAGNOSIS — I10 Essential (primary) hypertension: Secondary | ICD-10-CM | POA: Diagnosis not present

## 2021-02-18 DIAGNOSIS — E1122 Type 2 diabetes mellitus with diabetic chronic kidney disease: Secondary | ICD-10-CM | POA: Diagnosis not present

## 2021-02-21 DIAGNOSIS — E119 Type 2 diabetes mellitus without complications: Secondary | ICD-10-CM | POA: Diagnosis not present

## 2021-02-21 DIAGNOSIS — H11121 Conjunctival concretions, right eye: Secondary | ICD-10-CM | POA: Diagnosis not present

## 2021-02-21 LAB — HM DIABETES EYE EXAM

## 2021-02-24 ENCOUNTER — Other Ambulatory Visit: Payer: Self-pay | Admitting: Family Medicine

## 2021-02-24 DIAGNOSIS — E1122 Type 2 diabetes mellitus with diabetic chronic kidney disease: Secondary | ICD-10-CM

## 2021-02-24 NOTE — Telephone Encounter (Signed)
Requested Prescriptions  Pending Prescriptions Disp Refills   lisinopril (ZESTRIL) 5 MG tablet [Pharmacy Med Name: LISINOPRIL 5 MG TAB] 90 tablet 0    Sig: TAKE 1 TABLET BY MOUTH ONCE DAILY     Cardiovascular:  ACE Inhibitors Failed - 02/24/2021  5:41 PM      Failed - Cr in normal range and within 180 days    Creat  Date Value Ref Range Status  11/04/2020 1.19 (H) 0.60 - 0.95 mg/dL Final         Failed - Last BP in normal range    BP Readings from Last 1 Encounters:  11/25/20 (!) 195/75         Passed - K in normal range and within 180 days    Potassium  Date Value Ref Range Status  11/04/2020 4.4 3.5 - 5.3 mmol/L Final  06/28/2012 4.2 3.5 - 5.1 mmol/L Final         Passed - Patient is not pregnant      Passed - Valid encounter within last 6 months    Recent Outpatient Visits          3 months ago Vitamin B12 deficiency   [place], DO   5 months ago Vitamin B12 deficiency   [place], DO   5 months ago Vitamin B12 deficiency   [place], DO   5 months ago Vitamin B12 deficiency   [place], DO   6 months ago Vitamin B12 deficiency   [place], DO      Future Appointments            In 2 weeks [place], Devonne Doughty, [facility], [place]   In 9 months Bjorn Loser, [place]

## 2021-02-28 DIAGNOSIS — R296 Repeated falls: Secondary | ICD-10-CM | POA: Diagnosis not present

## 2021-02-28 DIAGNOSIS — R2689 Other abnormalities of gait and mobility: Secondary | ICD-10-CM | POA: Diagnosis not present

## 2021-02-28 DIAGNOSIS — R251 Tremor, unspecified: Secondary | ICD-10-CM | POA: Diagnosis not present

## 2021-02-28 DIAGNOSIS — R3915 Urgency of urination: Secondary | ICD-10-CM | POA: Diagnosis not present

## 2021-03-03 ENCOUNTER — Other Ambulatory Visit: Payer: Self-pay | Admitting: Family Medicine

## 2021-03-03 DIAGNOSIS — F418 Other specified anxiety disorders: Secondary | ICD-10-CM

## 2021-03-04 NOTE — Telephone Encounter (Signed)
Requested Prescriptions  Pending Prescriptions Disp Refills   escitalopram (LEXAPRO) 5 MG tablet [Pharmacy Med Name: ESCITALOPRAM OXALATE 5 MG TAB] 90 tablet 0    Sig: TAKE 1 TABLET BY MOUTH ONCE DAILY WITH FOOD     Psychiatry:  Antidepressants - SSRI Passed - 03/03/2021  8:11 AM      Passed - Completed PHQ-2 or PHQ-9 in the last 360 days      Passed - Valid encounter within last 6 months    Recent Outpatient Visits          3 months ago Vitamin B12 deficiency   [place], DO   5 months ago Vitamin B12 deficiency   [place], DO   5 months ago Vitamin B12 deficiency   [place], DO   6 months ago Vitamin B12 deficiency   [place], DO   6 months ago Vitamin B12 deficiency   [place], DO      Future Appointments            In 1 week [place] [facility], [facility]ne Doughty, [facility], [place]   In 9 months [place], Nicki Reaper, [place]

## 2021-03-14 ENCOUNTER — Ambulatory Visit (INDEPENDENT_AMBULATORY_CARE_PROVIDER_SITE_OTHER): Payer: PPO | Admitting: Family Medicine

## 2021-03-14 ENCOUNTER — Encounter: Payer: Self-pay | Admitting: Family Medicine

## 2021-03-14 VITALS — BP 158/59 | HR 68 | Ht 59.0 in | Wt 109.8 lb

## 2021-03-14 DIAGNOSIS — E1169 Type 2 diabetes mellitus with other specified complication: Secondary | ICD-10-CM

## 2021-03-14 DIAGNOSIS — F418 Other specified anxiety disorders: Secondary | ICD-10-CM | POA: Diagnosis not present

## 2021-03-14 DIAGNOSIS — R296 Repeated falls: Secondary | ICD-10-CM | POA: Diagnosis not present

## 2021-03-14 DIAGNOSIS — E785 Hyperlipidemia, unspecified: Secondary | ICD-10-CM

## 2021-03-14 DIAGNOSIS — G5631 Lesion of radial nerve, right upper limb: Secondary | ICD-10-CM

## 2021-03-14 DIAGNOSIS — N183 Chronic kidney disease, stage 3 unspecified: Secondary | ICD-10-CM | POA: Diagnosis not present

## 2021-03-14 DIAGNOSIS — E1122 Type 2 diabetes mellitus with diabetic chronic kidney disease: Secondary | ICD-10-CM

## 2021-03-14 DIAGNOSIS — R251 Tremor, unspecified: Secondary | ICD-10-CM

## 2021-03-14 DIAGNOSIS — I129 Hypertensive chronic kidney disease with stage 1 through stage 4 chronic kidney disease, or unspecified chronic kidney disease: Secondary | ICD-10-CM | POA: Diagnosis not present

## 2021-03-14 DIAGNOSIS — N1831 Chronic kidney disease, stage 3a: Secondary | ICD-10-CM | POA: Diagnosis not present

## 2021-03-14 DIAGNOSIS — I10 Essential (primary) hypertension: Secondary | ICD-10-CM | POA: Diagnosis not present

## 2021-03-14 MED ORDER — GLUCOSE BLOOD VI STRP: ORAL_STRIP | 11 refills | Status: DC

## 2021-03-14 MED ORDER — ONETOUCH DELICA LANCETS 33G MISC: 12 refills | Status: DC

## 2021-03-14 MED ORDER — ONETOUCH ULTRA 2 W/DEVICE KIT: PACK | 0 refills | Status: DC

## 2021-03-14 NOTE — Progress Notes (Signed)
Subjective:    Patient ID: EEVIE LAPP, female    DOB: Jul 21, 1929, 88 y.o.   MRN: [id number]  Lisa Mills is a 88 y.o. female presenting on 03/14/2021 for Hypertension and Anxiety   HPI  Type 2 Diabetes CKD IIIa Anemia CKD HTN Followed by Dr Candiss Norse in past for CKD >3+ years ago Iron supplement daily has improved, Hemoglobin up to 10.8 A1c down to 8.0 last lab Due for lab again On Lisinopril $RemoveBefor'5mg'pEDKMbQzByLO$  Not on medications for DM No hypoglycemia  OneTouch Glucometer - today in office she did a fingerstick, non fasting was 164 - she states she ate around 11:30 - 11:45am To compare to our lab draw   Vitamin B12 Deficiency - improved, completed inj series   Hypothyroidism Last lab reviewed Due for repeat labs On Levothyroxine 51mcg daily   Vitamin D Deficiency On Vitamin D Supplement.   Anxiety vs Tremors See below concern with her nerves, she feels tremors on inside that radiate out at times with worse stress or being worked up and with trying to use wrist/muscles more. She does not want any strong or addictive medications.  She expresses significant anxiety every day with fear of falling. Neurology advised that tremors were caused by anxiety and asked her to take inc dose Escitalopram 5 to $Re'10mg'Eot$ .  She has intention tremors with activity with R arm / hand wrist. If she is doing any fine task motor movement.  She walks her dog and it makes her anxious if she has to go up a curb with her dog.  Right Radial Nerve Palsy Neuropathy   Has followed w/ [facility] Neuro Dr Manuella Ghazi, they did nerve conduction study and told her that she has weakened radial nerve palsy and neuropathy. She had history of fractured R forearm. last follow-up with [facility] saw Kerry Dory PA, she was given advice that may need hand specialist in [place]. However patient is requesting   Said Baclofen every other day PRN, did not work for tremors, and it caused muscle pain. She has stopped this medication.  Tried  Atenolol $RemoveB'25mg'aRmvBMRy$  daily for period of time, and she felt tired and weak. She stopped taking medication.  Muscle cramping in legs - uses topical   Trigger Finger    Depression screen [facility] 2/9 03/14/2021 12/06/2020 11/11/2020  Decreased Interest 0 0 1  Down, Depressed, Hopeless 0 0 0  PHQ - 2 Score 0 0 1  Altered sleeping 0 0 0  Tired, decreased energy 0 0 0  Change in appetite 0 0 0  Feeling bad or failure about yourself  0 0 0  Trouble concentrating 0 0 0  Moving slowly or fidgety/restless 0 0 0  Suicidal thoughts 0 0 0  PHQ-9 Score 0 0 1  Difficult doing work/chores Not difficult at all Not difficult at all Not difficult at all  Some recent data might be hidden    Social History   Tobacco Use   Smoking status: Never   Smokeless tobacco: Never  Vaping Use   Vaping Use: Never used  Substance Use Topics   Alcohol use: Not Currently   Drug use: No    Review of Systems Per HPI unless specifically indicated above     Objective:    BP (!) 158/59    Pulse 68    Ht $R'4\' 11"'Fd$  (1.499 m)    Wt 109 lb 12.8 oz (49.8 kg)    LMP  (LMP Unknown)    SpO2 100%  BMI 22.18 kg/m   Wt Readings from Last 3 Encounters:  03/14/21 109 lb 12.8 oz (49.8 kg)  12/06/20 109 lb 9.6 oz (49.7 kg)  11/25/20 110 lb (49.9 kg)    Physical Exam Vitals and nursing note reviewed.  Constitutional:      General: She is not in acute distress.    Appearance: Normal appearance. She is well-developed. She is not diaphoretic.     Comments: Well-appearing, comfortable, cooperative  HENT:     Head: Normocephalic and atraumatic.  Eyes:     General:        Right eye: No discharge.        Left eye: No discharge.     Conjunctiva/sclera: Conjunctivae normal.  Cardiovascular:     Rate and Rhythm: Normal rate.  Pulmonary:     Effort: Pulmonary effort is normal.  Skin:    General: Skin is warm and dry.     Findings: No erythema or rash.  Neurological:     Mental Status: She is alert and oriented to person,  place, and time.     Comments: Mild intention tremor  R Hand radial nerve palsy, limited extension motor function R wrist/hand   Psychiatric:        Mood and Affect: Mood normal.        Behavior: Behavior normal.        Thought Content: Thought content normal.     Comments: Well groomed, good eye contact, normal speech and thoughts     Results for orders placed or performed in visit on 11/04/20  Vitamin B12  Result Value Ref Range   Vitamin B-12 642 200 - 1,100 pg/mL  CBC with Differential/Platelet  Result Value Ref Range   WBC 5.1 3.8 - 10.8 Thousand/uL   RBC 3.52 (L) 3.80 - 5.10 Million/uL   Hemoglobin 10.8 (L) 11.7 - 15.5 g/dL   HCT 33.0 (L) 35.0 - 45.0 %   MCV 93.8 80.0 - 100.0 fL   MCH 30.7 27.0 - 33.0 pg   MCHC 32.7 32.0 - 36.0 g/dL   RDW 12.3 11.0 - 15.0 %   Platelets 303 140 - 400 Thousand/uL   MPV 8.9 7.5 - 12.5 fL   Neutro Abs 2,861 1,500 - 7,800 cells/uL   Lymphs Abs 1,591 850 - 3,900 cells/uL   Absolute Monocytes 408 200 - 950 cells/uL   Eosinophils Absolute 168 15 - 500 cells/uL   Basophils Absolute 71 0 - 200 cells/uL   Neutrophils Relative % 56.1 %   Total Lymphocyte 31.2 %   Monocytes Relative 8.0 %   Eosinophils Relative 3.3 %   Basophils Relative 1.4 %  T4, free  Result Value Ref Range   Free T4 1.2 0.8 - 1.8 ng/dL  TSH  Result Value Ref Range   TSH 6.60 (H) 0.40 - 4.50 mIU/L  Hemoglobin A1c  Result Value Ref Range   Hgb A1c MFr Bld 8.0 (H) <5.7 % of total Hgb   Mean Plasma Glucose 183 mg/dL   eAG (mmol/L) 10.1 mmol/L  COMPLETE METABOLIC PANEL WITH GFR  Result Value Ref Range   Glucose, Bld 141 (H) 65 - 99 mg/dL   BUN 20 7 - 25 mg/dL   Creat 1.19 (H) 0.60 - 0.95 mg/dL   eGFR 44 (L) > OR = 60 mL/min/1.48m2   BUN/Creatinine Ratio 17 6 - 22 (calc)   Sodium 131 (L) 135 - 146 mmol/L   Potassium 4.4 3.5 - 5.3 mmol/L   Chloride 97 (  L) 98 - 110 mmol/L   CO2 27 20 - 32 mmol/L   Calcium 9.9 8.6 - 10.4 mg/dL   Total Protein 6.6 6.1 - 8.1 g/dL    Albumin 4.2 3.6 - 5.1 g/dL   Globulin 2.4 1.9 - 3.7 g/dL (calc)   AG Ratio 1.8 1.0 - 2.5 (calc)   Total Bilirubin 0.4 0.2 - 1.2 mg/dL   Alkaline phosphatase (APISO) 42 37 - 153 U/L   AST 18 10 - 35 U/L   ALT 10 6 - 29 U/L      Assessment & Plan:   Problem List Items Addressed This Visit     Type 2 diabetes with stage 3 chronic kidney disease GFR 30-59 (HCC) - Primary    Improved A1c to 8.0 Prior 8.4 Complications - at risk hypoglycemia, nephropathy with CKD III-IV, also in setting HTN - Contraindicated - SGLT2. H/o failed Glyburide, Januvia. Not interested in injectable - Off metformin in past due to CKD - off januvia ineffective  Plan:  1. Remain off Sulfonylurea avoid hypoglycemia> Not on medication. 2. Cannot take ASA due to CKD, continue statin, on ACEi 3. Encouraged lifestyle improvements with DM diet, continue walking      Relevant Medications   glucose blood (ONE TOUCH ULTRA TEST) test strip   Blood Glucose Monitoring Suppl (ONE TOUCH ULTRA 2) w/Device KIT   OneTouch Delica Lancets 63A MISC   Other Relevant Orders   Hemoglobin A1c   Hyperlipidemia associated with type 2 diabetes mellitus ([place])    Continue statin therapy Check Lipid      Relevant Orders   Lipid panel   TSH   T4, free   Benign hypertension with CKD (chronic kidney disease) stage III (HCC)    Mildly elevated initial BP - Home BP readings reviewed  Complication with CKDIII   Plan:  1. Continue current BP regimen Lisinopril 5mg  daily Remain OFF Atenolol - failed w side effect (was on for tremor)  2. Encourage improved lifestyle - low sodium diet, regular exercise 3. Continue monitor BP outside office, bring readings to next visit, if persistently >140/90 or new symptoms notify office sooner      Relevant Orders   BASIC METABOLIC PANEL WITH GFR   Anxiety associated with depression   Relevant Medications   escitalopram (LEXAPRO) 10 MG tablet   Other Visit Diagnoses     Recurrent falls        Right radial nerve palsy       Relevant Orders   Ambulatory referral to Orthopedic Surgery   Tremors of nervous system          Tremors, stable benign likely with weakness, prior nerve injury. Some intention / essential tremor Already self DC'd Atenolol and Baclofen. Remain off due to side effects   chronic R radial nerve palsy after history of fracture forearm in 2022, requesting further evaluation and appropriate rehab regimen, supportive splint or other recommendations, she prefers to avoid repeat surgery   Referral to Hand Wrist Dr Jackqulyn Livings  Orders Placed This Encounter  Procedures   Lipid panel    Order Specific Question:   Has the patient fasted?    Answer:   Yes   Hemoglobin G5X   BASIC METABOLIC PANEL WITH GFR   TSH   T4, free   Ambulatory referral to Orthopedic Surgery    Referral Priority:   Routine    Referral Type:   Surgical    Referral Reason:   Specialty Services Required  Requested Specialty:   Orthopedic Surgery    Number of Visits Requested:   1     Meds ordered this encounter  Medications   glucose blood (ONE TOUCH ULTRA TEST) test strip    Sig: CHECK BLOOD SUGAR UP TO 2 TIMES A DAY.    Dispense:  200 each    Refill:  11   Blood Glucose Monitoring Suppl (ONE TOUCH ULTRA 2) w/Device KIT    Sig: Use to check blood sugar as advised up to 2 times daily    Dispense:  1 kit    Refill:  0   OneTouch Delica Lancets 09M MISC    Sig: Use to check blood sugar up to 2 times daily    Dispense:  200 each    Refill:  12    Onetouch ultra    Follow up plan: Return in about 4 months (around 07/12/2021) for 4 month follow-up DM A1c, Tremors, Anxiety.  Nobie Putnam, DO [facility] 03/14/2021, 1:25 PM

## 2021-03-14 NOTE — Patient Instructions (Addendum)
Thank you for coming to the office today.  Discontinue Atenolol 25mg  daily Discontinue Baclofen 5-10mg  daily  Keep using topical cream for muscles as needed.  Increase dose Escitalopram from 5 to 10mg  for anxiety.  Labs today  I can re order the OneTouch Ultra glucometer.  [facility] Address: [address], [place], [place] [address] Hours:  9AM-5PM Phone: [phone]  Verita Lamb, MD (Hand, Wrist, Elbow specialist)  Please schedule a Follow-up Appointment to: Return in about 4 months (around 07/12/2021) for 4 month follow-up DM A1c, Tremors, Anxiety.  If you have any other questions or concerns, please feel free to call the office or send a message through [place]. You may also schedule an earlier appointment if necessary.  Additionally, you may be receiving a survey about your experience at our office within a few days to 1 week by e-mail or mail. We value your feedback.  Nobie Putnam, DO [place]

## 2021-03-14 NOTE — Assessment & Plan Note (Signed)
Improved A1c to 8.0 Prior 8.4 Complications - at risk hypoglycemia, nephropathy with CKD III-IV, also in setting HTN - Contraindicated - SGLT2. H/o failed Glyburide, Januvia. Not interested in injectable - Off metformin in past due to CKD - off januvia ineffective  Plan:  1. Remain off Sulfonylurea avoid hypoglycemia> Not on medication. 2. Cannot take ASA due to CKD, continue statin, on ACEi 3. Encouraged lifestyle improvements with DM diet, continue walking

## 2021-03-14 NOTE — Assessment & Plan Note (Signed)
Continue statin therapy Check Lipid

## 2021-03-14 NOTE — Assessment & Plan Note (Signed)
Mildly elevated initial BP - Home BP readings reviewed  Complication with CKDIII   Plan:  1. Continue current BP regimen Lisinopril 5mg  daily Remain OFF Atenolol - failed w side effect (was on for tremor)  2. Encourage improved lifestyle - low sodium diet, regular exercise 3. Continue monitor BP outside office, bring readings to next visit, if persistently >140/90 or new symptoms notify office sooner

## 2021-03-15 LAB — LIPID PANEL
Cholesterol: 193 mg/dL (ref <200)
HDL: 75 mg/dL (ref >=50)
LDL Cholesterol (Calc): 98 mg/dL (calc)
Non-HDL Cholesterol (Calc): 118 mg/dL (calc) (ref <130)
Total CHOL/HDL Ratio: 2.6 (calc) (ref <5.0)
Triglycerides: 100 mg/dL (ref <150)

## 2021-03-15 LAB — BASIC METABOLIC PANEL WITH GFR
BUN/Creatinine Ratio: 15 (calc) (ref 6–22)
BUN: 15 mg/dL (ref 7–25)
CO2: 28 mmol/L (ref 20–32)
Calcium: 9.9 mg/dL (ref 8.6–10.4)
Chloride: 94 mmol/L — ABNORMAL LOW (ref 98–110)
Creat: 1.02 mg/dL — ABNORMAL HIGH (ref 0.60–0.95)
Glucose, Bld: 151 mg/dL — ABNORMAL HIGH (ref 65–139)
Potassium: 4.4 mmol/L (ref 3.5–5.3)
Sodium: 129 mmol/L — ABNORMAL LOW (ref 135–146)
eGFR: 53 mL/min/{1.73_m2} — ABNORMAL LOW (ref >=60)

## 2021-03-15 LAB — HEMOGLOBIN A1C
Hgb A1c MFr Bld: 7.5 % of total Hgb — ABNORMAL HIGH (ref <5.7)
Mean Plasma Glucose: 169 mg/dL
eAG (mmol/L): 9.3 mmol/L

## 2021-03-15 LAB — T4, FREE: Free T4: 1.2 ng/dL (ref 0.8–1.8)

## 2021-03-15 LAB — TSH: TSH: 4.46 mIU/L (ref 0.40–4.50)

## 2021-03-18 DIAGNOSIS — N281 Cyst of kidney, acquired: Secondary | ICD-10-CM | POA: Diagnosis not present

## 2021-03-18 DIAGNOSIS — E1122 Type 2 diabetes mellitus with diabetic chronic kidney disease: Secondary | ICD-10-CM | POA: Diagnosis not present

## 2021-03-18 DIAGNOSIS — N1831 Chronic kidney disease, stage 3a: Secondary | ICD-10-CM | POA: Diagnosis not present

## 2021-03-18 DIAGNOSIS — E871 Hypo-osmolality and hyponatremia: Secondary | ICD-10-CM | POA: Diagnosis not present

## 2021-03-19 ENCOUNTER — Other Ambulatory Visit: Payer: Self-pay | Admitting: Family Medicine

## 2021-03-19 DIAGNOSIS — E1169 Type 2 diabetes mellitus with other specified complication: Secondary | ICD-10-CM

## 2021-03-19 DIAGNOSIS — E785 Hyperlipidemia, unspecified: Secondary | ICD-10-CM

## 2021-03-19 NOTE — Telephone Encounter (Signed)
Per last office visit- continue statin ?Requested Prescriptions  ?Pending Prescriptions Disp Refills  ?? simvastatin (ZOCOR) 40 MG tablet [Pharmacy Med Name: SIMVASTATIN 40 MG TAB] 90 tablet 3  ?  Sig: TAKE 1 TABLET BY MOUTH AT BEDTIME  ?  ? Cardiovascular:  Antilipid - Statins Failed - 03/19/2021  8:33 AM  ?  ?  Failed - Lipid Panel in normal range within the last 12 months  ?  Cholesterol  ?Date Value Ref Range Status  ?03/14/2021 193 <200 mg/dL Final  ? ?LDL Cholesterol (Calc)  ?Date Value Ref Range Status  ?03/14/2021 98 mg/dL (calc) Final  ?  Comment:  ?  Reference range: <100 ?Marland Kitchen ?Desirable range <100 mg/dL for primary prevention;   ?<70 mg/dL for patients with CHD or diabetic patients  ?with > or = 2 CHD risk factors. ?. ?LDL-C is now calculated using the Martin-Hopkins  ?calculation, which is a validated novel method providing  ?better accuracy than the Friedewald equation in the  ?estimation of LDL-C.  ?Cresenciano Genre et al. Annamaria Helling. 3729;021(11): 2061-2068  ?(http://education.[employer].com/faq/FAQ164) ?  ? ?HDL  ?Date Value Ref Range Status  ?03/14/2021 75 > OR = 50 mg/dL Final  ? ?Triglycerides  ?Date Value Ref Range Status  ?03/14/2021 100 <150 mg/dL Final  ? ?  ?  ?  Passed - Patient is not pregnant  ?  ?  Passed - Valid encounter within last 12 months  ?  Recent Outpatient Visits   ?      ? 5 days ago Type 2 diabetes mellitus with stage 3a chronic kidney disease, without long-term current use of insulin ([place])  ? [place], DO  ? 4 months ago Vitamin B12 deficiency  ? [place], DO  ? 5 months ago Vitamin B12 deficiency  ? [place], DO  ? 6 months ago Vitamin B12 deficiency  ? [place], DO  ? 6 months ago Vitamin B12 deficiency  ? [place], DO  ?  ?  ?Future Appointments   ?        ? In 3  months [place], [facility], [place]  ? In 8 months MacDiarmid, Nicki Reaper, MD [place]  ?  ? ?  ?  ?  ? ?

## 2021-03-24 ENCOUNTER — Ambulatory Visit (INDEPENDENT_AMBULATORY_CARE_PROVIDER_SITE_OTHER): Payer: PPO

## 2021-03-24 ENCOUNTER — Telehealth: Payer: PPO

## 2021-03-24 DIAGNOSIS — N183 Chronic kidney disease, stage 3 unspecified: Secondary | ICD-10-CM

## 2021-03-24 DIAGNOSIS — N1831 Chronic kidney disease, stage 3a: Secondary | ICD-10-CM

## 2021-03-24 DIAGNOSIS — R197 Diarrhea, unspecified: Secondary | ICD-10-CM

## 2021-03-24 DIAGNOSIS — E1122 Type 2 diabetes mellitus with diabetic chronic kidney disease: Secondary | ICD-10-CM

## 2021-03-24 DIAGNOSIS — M158 Other polyosteoarthritis: Secondary | ICD-10-CM

## 2021-03-24 DIAGNOSIS — R32 Unspecified urinary incontinence: Secondary | ICD-10-CM

## 2021-03-24 DIAGNOSIS — G5631 Lesion of radial nerve, right upper limb: Secondary | ICD-10-CM

## 2021-03-24 DIAGNOSIS — N3946 Mixed incontinence: Secondary | ICD-10-CM

## 2021-03-24 DIAGNOSIS — E1169 Type 2 diabetes mellitus with other specified complication: Secondary | ICD-10-CM

## 2021-03-24 DIAGNOSIS — E785 Hyperlipidemia, unspecified: Secondary | ICD-10-CM

## 2021-03-24 DIAGNOSIS — I10 Essential (primary) hypertension: Secondary | ICD-10-CM

## 2021-03-24 DIAGNOSIS — I129 Hypertensive chronic kidney disease with stage 1 through stage 4 chronic kidney disease, or unspecified chronic kidney disease: Secondary | ICD-10-CM

## 2021-03-24 NOTE — Chronic Care Management (AMB) (Signed)
Chronic Care Management   CCM RN Visit Note  03/24/2021 Name: Lisa Mills MRN: [id number] DOB: Jul 04, 1929  Subjective: Lisa Mills is a 89 y.o. year old female who is a primary care patient of Olin Hauser, DO. The care management team was consulted for assistance with disease management and care coordination needs.    Engaged with patient by telephone for follow up visit in response to provider referral for case management and/or care coordination services.   Consent to Services:  The patient was given information about Chronic Care Management services, agreed to services, and gave verbal consent prior to initiation of services.  Please see initial visit note for detailed documentation.   Patient agreed to services and verbal consent obtained.   Assessment: Review of patient past medical history, allergies, medications, health status, including review of consultants reports, laboratory and other test data, was performed as part of comprehensive evaluation and provision of chronic care management services.   SDOH (Social Determinants of Health) assessments and interventions performed:    CCM Care Plan  Allergies  Allergen Reactions   Aspirin Other (See Comments)    Burns stomach   Conray [Iothalamate] Hives    IV dye conray-400   Dye Fdc Red [Red Dye] Hives   Sulfa Antibiotics     Unknown Reaction, not used in years   Sulfasalazine     Other reaction(s): Unknown   Prednisone     Indigestion     Outpatient Encounter Medications as of 03/24/2021  Medication Sig   acetaminophen (TYLENOL) 500 MG tablet Take 1 tablet (500 mg total) by mouth every 12 (twelve) hours. (Patient not taking: Reported on 12/06/2020)   Blood Glucose Monitoring Suppl (ONE TOUCH ULTRA 2) w/Device KIT Use to check blood sugar as advised up to 2 times daily   Cholecalciferol (VITAMIN D) 50 MCG (2000 UT) CAPS Take 2,000 Units by mouth daily.    cyanocobalamin (,VITAMIN B-12,) 1000 MCG/ML  injection Inject 1 mL (1,000 mcg total) into the muscle every 30 (thirty) days. For 3 months, then need repeat B12 lab level   escitalopram (LEXAPRO) 10 MG tablet Take 1 tablet (10 mg total) by mouth daily.   ferrous sulfate 325 (65 FE) MG tablet Take 325 mg by mouth daily.    glucose blood (ONE TOUCH ULTRA TEST) test strip CHECK BLOOD SUGAR UP TO 2 TIMES A DAY.   glycerin adult 2 g suppository Place 1 suppository rectally as needed for constipation.   Histamine Dihydrochloride (AUSTRALIAN DREAM ARTHRITIS) 0.025 % CREA Apply 1 application topically daily as needed (Pain).   hydrocortisone (ANUSOL-HC) 2.5 % rectal cream USE 1 APPLICATION RECTALLY TWICE DAILY AS DIRECTED (Patient taking differently: Place 1 application rectally daily as needed for hemorrhoids or anal itching.)   levothyroxine (SYNTHROID) 25 MCG tablet Take 1 tablet (25 mcg total) by mouth daily before breakfast.   lisinopril (ZESTRIL) 5 MG tablet TAKE 1 TABLET BY MOUTH ONCE DAILY   nitrofurantoin (MACRODANTIN) 100 MG capsule Take 1 capsule (100 mg total) by mouth daily.   omeprazole (PRILOSEC) 20 MG capsule TAKE 1 CAPSULE BY MOUTH ONCE DAILY   OneTouch Delica Lancets 32Y MISC Use to check blood sugar up to 2 times daily   polyethylene glycol (MIRALAX / GLYCOLAX) 17 g packet Take 17 g by mouth daily as needed for moderate constipation or severe constipation. (Patient taking differently: Take 17 g by mouth daily.)   simvastatin (ZOCOR) 40 MG tablet TAKE 1 TABLET BY MOUTH AT BEDTIME  traZODone (DESYREL) 50 MG tablet Take 0.5-1 tablets (25-50 mg total) by mouth at bedtime as needed for sleep.   witch hazel-glycerin (TUCKS) pad Apply 1 application topically as needed for itching.   No facility-administered encounter medications on file as of 03/24/2021.    Patient Active Problem List   Diagnosis Date Noted   Right wrist drop 03/25/2020   Psychophysiological insomnia 10/06/2019   Nonintractable headache 10/06/2019   Urinary  incontinence 10/06/2019   Internal and external bleeding hemorrhoids 08/15/2019   Sacral pain    Radicular pain of sacrum 07/11/2019   Sacral fracture, closed ([place]) 07/04/2019   Fall 07/04/2019   Bilateral pubic rami fractures, sequela 07/04/2019   Background diabetic retinopathy ([place]) 01/23/2019   Schatzki's ring 03/04/2017   Post herpetic neuralgia 10/06/2016   Hx of colonic polyps 09/22/2016   Osteopenia 09/02/2016   Anxiety associated with depression 08/25/2016   Trapezius muscle spasm 05/25/2016   Intermittent left lower quadrant abdominal pain 03/16/2016   Anemia in chronic kidney disease (CKD) 02/26/2016   Type 2 diabetes with stage 3 chronic kidney disease GFR 30-59 ([place]) 02/25/2016   Hypothyroidism 02/25/2016   Constipation 02/25/2016   GERD (gastroesophageal reflux disease) 02/25/2016   Hyperlipidemia associated with type 2 diabetes mellitus ([place]) 02/25/2016   DJD (degenerative joint disease) of cervical spine 02/25/2016   Osteoarthritis of multiple joints 02/25/2016   Hiatal hernia 02/25/2016   CKD stage 3 due to type 2 diabetes mellitus ([place]) 02/25/2016   Chronic kidney disease, unspecified 09/05/2013   Benign hypertension with CKD (chronic kidney disease) stage III ([place]) 09/05/2013   CMC arthritis, thumb, degenerative 06/15/2013   Onychomycosis due to dermatophyte 05/25/2013   Acquired keratoderma 05/25/2013    Conditions to be addressed/monitored:HTN, HLD, DMII, and Chronic pain, urinary incontinence/bowel incontinence, OAB  Care Plan : RNCM: Adult plan of care for Chronic Disease Management and Care Coordination Needs  Updates made by Vanita Ingles, RN since 03/24/2021 12:00 AM     Problem: RNCM: Development of Plan of Care for Chronic Disease Management (HTN, HLD, DM, Falls, Chronic pain, Urinary Incontinence)   Priority: High     Long-Range Goal: RNCM: Effective management  of Plan of Care for Chronic Disease Management (HTN, HLD, DM, Falls, Chronic pain,   Urinary Incontinence)   Start Date: 11/25/2020  Expected End Date: 11/25/2021  Priority: High  Note:   Current Barriers:  Chronic Disease Management support and education needs related to HTN, HLD, DMII, Chronic pain, and Falls Prevention and Urinary Incontience   RNCM Clinical Goal(s):  Patient will verbalize understanding of plan for management of HTN, HLD, DMII, Chronic pain, falls prevention, Overactive Bladder, and urinary incontience  as evidenced by following plan of care, taking medications as directed, and dietary restrictions demonstrate understanding of rationale for each prescribed medication as evidenced by compliance with medications and calling for refills before running out of medications     attend all scheduled medical appointments: 07-11-2021 at 1:20 pm as evidenced by keeping appointments         demonstrate improved and ongoing adherence to prescribed treatment plan for HTN, HLD, DMII, Chronic pain, Overactive Bladder, and falls prevention and safety as evidenced by compliance with the plan of care and working with the CCM team for effective management of chronic conditions  demonstrate a decrease in HTN, HLD, DMII, Overactive Bladder, Chronic pain, and falls and safety exacerbations  as evidenced by effective management of chronic conditions and working with the CCM team for health  and well being demonstrate ongoing self health care management ability effective management of chronic conditions as evidenced by working with the CCM team through collaboration with Consulting civil engineer, provider, and care team.   Interventions: 1:1 collaboration with primary care provider regarding development and update of comprehensive plan of care as evidenced by provider attestation and co-signature Inter-disciplinary care team collaboration (see longitudinal plan of care) Evaluation of current treatment plan related to  self management and patient's adherence to plan as established by  provider   SDOH Barriers (Status: Goal on Track (progressing): YES.) Long Term Goal  Patient interviewed and SDOH assessment performed        Patient interviewed and appropriate assessments performed Provided patient with information about resources available in [facility] and care guides being available for assistance for new needs or concerns Discussed plans with patient for ongoing care management follow up and provided patient with direct contact information for care management team Advised patient to to call the the office for changes for SDOH, questions, and concerns. 01-27-2021: The patient is driving to her appointments and has help when needed for transportation needs    Diabetes:  (Status: Goal on Track (progressing): YES.) Long Term Goal   Lab Results  Component Value Date   HGBA1C 7.5 (H) 03/14/2021  Previous 8.0 Assessed patient's understanding of A1c goal: <7% Provided education to patient about basic DM disease process; Reviewed medications with patient and discussed importance of medication adherence. 03-24-2021: the patient takes medications as directed. ;        Reviewed prescribed diet with patient heart healthy/ADA diet. 01-27-2021: The patient states that she tries to watch what she is eating but sometimes she eats too much salt and other things she should not. She ate well over the holidays. The patient denies any episodes of shaking or being sweaty. 03-24-2021: The patient is doing better with her food choices. The patient praised for A1C decreasing. The patient states that she is not drinking as much water as she doesn't want her sodium level to be off. The patient did have low sodium on recent lab work. Education and support given. Will continue to monitor; Counseled on importance of regular laboratory monitoring as prescribed. 01-27-2021: The patient will have more lab work at next follow up with the pcp. Education and support given. 03-24-2021: The patient had recent lab  work. Reviewed with the patient today. The patient verbalized understanding.         Discussed plans with patient for ongoing care management follow up and provided patient with direct contact information for care management team;      Provided patient with written educational materials related to hypo and hyperglycemia and importance of correct treatment. 03-24-2021: The patient knows how to actively treat hypo and hyperglycemia.        Reviewed scheduled/upcoming provider appointments including: 07-11-2021 at 1:20 pm;         Advised patient, providing education and rationale, to check cbg as directed  and record. 03-24-2021: The patient has not been taking her blood sugars. It is hard for her to do with her right radius having wrist drop. She states she does not know what it will be but she is eating good and taking her medications. Education and support given.        call provider for findings outside established parameters;       Review of patient status, including review of consultants reports, relevant laboratory and other test results, and medications completed;  Screening for signs and symptoms of depression related to chronic disease state;        Assessed social determinant of health barriers;         Falls:  (Status: Goal Met. No Needs Identified this visit) Long Term Goal  Goal met at this time. Will continue to monitor for changes and new needs.  Provided written and verbal education re: potential causes of falls and Fall prevention strategies Reviewed medications and discussed potential side effects of medications such as dizziness and frequent urination Advised patient of importance of notifying provider of falls Assessed for signs and symptoms of orthostatic hypotension Assessed for falls since last encounter. 01-27-2021: The patient denies any new falls. States she is being careful especially when she takes her dog out. She only takes him for a walk about one time a day.  Assessed  patients knowledge of fall risk prevention secondary to previously provided education Provided patient information for fall alert systems  OAB/Urinary Incontinence/Fecal incontinence    (Status: Goal on Track (progressing): YES.) Long Term Goal  Evaluation of current treatment plan related to Overactive Bladder and fecal incontinence,  self-management and patient's adherence to plan as established by provider. 01-27-2021: the patient states her OAB/Urinary and fecal Incontinence is stable. She sometimes has to go quickly and has bowel movement/diarrhea at the same time. The patient states that she makes sure she cleans herself good and she has a herbal baby wipe that works well for her. She states that she feels she is managing this much better than what she was. 03-24-2021: The patient states that she is stable for the most part. Still has some "accidents" but is monitoring how much of the miralax she takes and is adjusting it to how her bowel patterns are. She watches closely and keeps a good check on things.  Discussed plans with patient for ongoing care management follow up and provided patient with direct contact information for care management team Advised patient to call the office for changes in urinary/bowel health, any sx or sx of infection or new concerns; Provided education to patient re: basic urinary/bowel healthy and management of urinary system/GI system ; Reviewed medications with patient and discussed compliance. 03-24-2021: The patient is compliant with medications ; Reviewed scheduled/upcoming provider appointments including 07-11-2021 at 1:20 pm; Discussed plans with patient for ongoing care management follow up and provided patient with direct contact information for care management team;  Hyperlipidemia:  (Status: Goal on Track (progressing): YES.) Lab Results  Component Value Date   CHOL 193 03/14/2021   HDL 75 03/14/2021   LDLCALC 98 03/14/2021   TRIG 100 03/14/2021   CHOLHDL 2.6  03/14/2021     Medication review performed; medication list updated in electronic medical record. 03-24-2021: Takes Simvastatin 40 mg daily  Provider established cholesterol goals reviewed. 03-24-2021: Review of lab work today and praised the patient for being at goal; Counseled on importance of regular laboratory monitoring as prescribed. 01-27-2021: Review of upcoming appointment and review of regular bloodwork for evaluation of cholesterol levels. 03-24-2021: The patient has had recent lipid panel. Review with the patient today. The patient is at goal.  Provided HLD educational materials; Reviewed role and benefits of statin for ASCVD risk reduction; Discussed strategies to manage statin-induced myalgias; Reviewed importance of limiting foods high in cholesterol. 03-24-2021: Review of heart healthy/ADA diet   Hypertension: (Status: Goal on Track (progressing): YES.) Last practice recorded BP readings:  BP Readings from Last 3 Encounters:  03/14/21 (!) 158/59  11/25/20 (!) 195/75  11/11/20 (!) 148/58  Most recent eGFR/CrCl:  Lab Results  Component Value Date   EGFR 53 (L) 03/14/2021    No components found for: CRCL  Evaluation of current treatment plan related to hypertension self management and patient's adherence to plan as established by provider. 01-27-2021: The patient has not been checking her blood pressures at home but denies headaches or other concerns related to HTN. The patient does state sometimes she eats too much sodium and should cut back on this. 03-24-2021: The patient states her blood pressures are better at home. She denies any headaches or other concerns with elevated blood pressures. She is monitoring her dietary intake and taking medications as directed. Will continue to monitor for changes.  ;   Reviewed prescribed diet heart healthy/ADA diet. 03-24-2021: Review of heart healthy/ADA diet  Reviewed medications with patient and discussed importance of compliance. 03-24-2021: States  compliance with medications   Discussed plans with patient for ongoing care management follow up and provided patient with direct contact information for care management team; Advised patient, providing education and rationale, to monitor blood pressure daily and record, calling PCP for findings outside established parameters. 01-27-2021: Education on the goal of blood pressures being <127 systolic and <51 diastolic. The patient states that she will see the neurologist soon and pcp in February. 03-24-2021: Recent visits with nephrologist and pcp. The patient is mindful of normal blood pressure readings.  ;  Advised patient to discuss blood pressure trends  with provider; Provided education on prescribed diet heart healthy/ADA diet. 03-24-2021: Review and education given ;  Discussed complications of poorly controlled blood pressure such as heart disease, stroke, circulatory complications, vision complications, kidney impairment, sexual dysfunction;    Pain:  (Status: Goal on Track (progressing): YES.) Long Term Goal  Pain assessment performed. 01-27-2021: The patient rates her overall pain at a 2 today on a scale of 0-10. States that different areas have different pain and they are not all painful at the same time. She states that her right radius is the same. She got a new splint but when she got it on 12-23-2020 the first thing she did was take it off because it is the hard plastic and it makes her wrist stay straight and she could not do anything with it. She states she is going to talk to the neurologist about it and see what recommendations the neurologist gives her. She states that she wishes there was something that would help it but she does not know what that will be. Empathetic listening and support given. Will continue to monitor. 03-24-2021: The patient denies any pain the patient states that she is going on 03-26-2021 to start physical therapy. She had been doing physical therapy but she will go and do  this. She says her left hand is weaker now because she is so dependent on it but she feels like she is doing okay with it.  Medications reviewed. 03-24-2021: Takes pain medications when needed Reviewed provider established plan for pain management. 03-24-2021: the patient takes OTC medications when needed. She does not wish to take anything that she would become dependent on. She states she also has creams she uses when she needs to to help with pain relief.  Discussed importance of adherence to all scheduled medical appointments. 01-27-2021: Has upcoming appointment with neurologist and also sees the pcp in February. 07-11-2021 at 120 pm with pcp. Will see PT on 03-26-2021. ; Counseled on the importance of reporting  any/all new or changed pain symptoms or management strategies to pain management provider; Advised patient to report to care team affect of pain on daily activities; Discussed use of relaxation techniques and/or diversional activities to assist with pain reduction (distraction, imagery, relaxation, massage, acupressure, TENS, heat, and cold application; Reviewed with patient prescribed pharmacological and nonpharmacological pain relief strategies; Advised patient to discuss unresolved pain, changes in level or intensity of pain with provider; Screening for signs and symptoms of depression related to chronic disease state;  Assessed social determinant of health barriers;    Patient Goals/Self-Care Activities: Patient will self administer medications as prescribed as evidenced by self report/primary caregiver report  Patient will attend all scheduled provider appointments as evidenced by clinician review of documented attendance to scheduled appointments and patient/caregiver report Patient will call pharmacy for medication refills as evidenced by patient report and review of pharmacy fill history as appropriate Patient will attend church or other social activities as evidenced by patient  report Patient will continue to perform ADL's independently as evidenced by patient/caregiver report Patient will continue to perform IADL's independently as evidenced by patient/caregiver report Patient will call provider office for new concerns or questions as evidenced by review of documented incoming telephone call notes and patient report Patient will work with BSW to address care coordination needs and will continue to work with the clinical team to address health care and disease management related needs as evidenced by documented adherence to scheduled care management/care coordination appointments - schedule appointment with eye doctor - check blood sugar at prescribed times: before meals and at bedtime, when you have symptoms of low or high blood sugar, and before and after exercise - check feet daily for cuts, sores or redness - enter blood sugar readings and medication or insulin into daily log - take the blood sugar log to all doctor visits - trim toenails straight across - drink 6 to 8 glasses of water each day - eat fish at least once per week - fill half of plate with vegetables - limit fast food meals to no more than 1 per week - manage portion size - prepare main meal at home 3 to 5 days each week - read food labels for fat, fiber, carbohydrates and portion size - reduce red meat to 2 to 3 times a week - keep feet up while sitting - wash and dry feet carefully every day - wear comfortable, cotton socks - wear comfortable, well-fitting shoes - check blood pressure 3 times per week - choose a place to take my blood pressure (home, clinic or office, retail store) - write blood pressure results in a log or diary - learn about high blood pressure - keep a blood pressure log - take blood pressure log to all doctor appointments - call doctor for signs and symptoms of high blood pressure - develop an action plan for high blood pressure - keep all doctor appointments - take  medications for blood pressure exactly as prescribed - report new symptoms to your doctor - eat more whole grains, fruits and vegetables, lean meats and healthy fats - call for medicine refill 2 or 3 days before it runs out - take all medications exactly as prescribed - call doctor with any symptoms you believe are related to your medicine - call doctor when you experience any new symptoms - go to all doctor appointments as scheduled - adhere to prescribed diet: heart healthy/ADA diet        Plan:Telephone follow up  appointment with care management team member scheduled for:  05-26-2021 at 100 pm  Noreene Larsson RN, MSN, [place] Mobile: [phone]

## 2021-03-24 NOTE — Patient Instructions (Signed)
Visit Information  Thank you for taking time to visit with me today. Please don't hesitate to contact me if I can be of assistance to you before our next scheduled telephone appointment.  Following are the goals we discussed today:  RNCM Clinical Goal(s):  Patient will verbalize understanding of plan for management of HTN, HLD, DMII, Chronic pain, falls prevention, Overactive Bladder, and urinary incontience  as evidenced by following plan of care, taking medications as directed, and dietary restrictions demonstrate understanding of rationale for each prescribed medication as evidenced by compliance with medications and calling for refills before running out of medications     attend all scheduled medical appointments: 07-11-2021 at 1:20 pm as evidenced by keeping appointments         demonstrate improved and ongoing adherence to prescribed treatment plan for HTN, HLD, DMII, Chronic pain, Overactive Bladder, and falls prevention and safety as evidenced by compliance with the plan of care and working with the CCM team for effective management of chronic conditions  demonstrate a decrease in HTN, HLD, DMII, Overactive Bladder, Chronic pain, and falls and safety exacerbations  as evidenced by effective management of chronic conditions and working with the CCM team for health and well being demonstrate ongoing self health care management ability effective management of chronic conditions as evidenced by working with the CCM team through collaboration with Consulting civil engineer, provider, and care team.    Interventions: 1:1 collaboration with primary care provider regarding development and update of comprehensive plan of care as evidenced by provider attestation and co-signature Inter-disciplinary care team collaboration (see longitudinal plan of care) Evaluation of current treatment plan related to  self management and patient's adherence to plan as established by provider     SDOH Barriers (Status: Goal on  Track (progressing): YES.) Long Term Goal  Patient interviewed and SDOH assessment performed        Patient interviewed and appropriate assessments performed Provided patient with information about resources available in [facility] and care guides being available for assistance for new needs or concerns Discussed plans with patient for ongoing care management follow up and provided patient with direct contact information for care management team Advised patient to to call the the office for changes for SDOH, questions, and concerns. 01-27-2021: The patient is driving to her appointments and has help when needed for transportation needs       Diabetes:  (Status: Goal on Track (progressing): YES.) Long Term Goal         Lab Results  Component Value Date    HGBA1C 7.5 (H) 03/14/2021  Previous 8.0 Assessed patient's understanding of A1c goal: <7% Provided education to patient about basic DM disease process; Reviewed medications with patient and discussed importance of medication adherence. 03-24-2021: the patient takes medications as directed. ;        Reviewed prescribed diet with patient heart healthy/ADA diet. 01-27-2021: The patient states that she tries to watch what she is eating but sometimes she eats too much salt and other things she should not. She ate well over the holidays. The patient denies any episodes of shaking or being sweaty. 03-24-2021: The patient is doing better with her food choices. The patient praised for A1C decreasing. The patient states that she is not drinking as much water as she doesn't want her sodium level to be off. The patient did have low sodium on recent lab work. Education and support given. Will continue to monitor; Counseled on importance of regular laboratory monitoring as  prescribed. 01-27-2021: The patient will have more lab work at next follow up with the pcp. Education and support given. 03-24-2021: The patient had recent lab work. Reviewed with the patient  today. The patient verbalized understanding.         Discussed plans with patient for ongoing care management follow up and provided patient with direct contact information for care management team;      Provided patient with written educational materials related to hypo and hyperglycemia and importance of correct treatment. 03-24-2021: The patient knows how to actively treat hypo and hyperglycemia.        Reviewed scheduled/upcoming provider appointments including: 07-11-2021 at 1:20 pm;         Advised patient, providing education and rationale, to check cbg as directed  and record. 03-24-2021: The patient has not been taking her blood sugars. It is hard for her to do with her right radius having wrist drop. She states she does not know what it will be but she is eating good and taking her medications. Education and support given.        call provider for findings outside established parameters;       Review of patient status, including review of consultants reports, relevant laboratory and other test results, and medications completed;       Screening for signs and symptoms of depression related to chronic disease state;        Assessed social determinant of health barriers;          Falls:  (Status: Goal Met. No Needs Identified this visit) Long Term Goal  Goal met at this time. Will continue to monitor for changes and new needs.  Provided written and verbal education re: potential causes of falls and Fall prevention strategies Reviewed medications and discussed potential side effects of medications such as dizziness and frequent urination Advised patient of importance of notifying provider of falls Assessed for signs and symptoms of orthostatic hypotension Assessed for falls since last encounter. 01-27-2021: The patient denies any new falls. States she is being careful especially when she takes her dog out. She only takes him for a walk about one time a day.  Assessed patients knowledge of fall risk  prevention secondary to previously provided education Provided patient information for fall alert systems   OAB/Urinary Incontinence/Fecal incontinence    (Status: Goal on Track (progressing): YES.) Long Term Goal  Evaluation of current treatment plan related to Overactive Bladder and fecal incontinence,  self-management and patient's adherence to plan as established by provider. 01-27-2021: the patient states her OAB/Urinary and fecal Incontinence is stable. She sometimes has to go quickly and has bowel movement/diarrhea at the same time. The patient states that she makes sure she cleans herself good and she has a herbal baby wipe that works well for her. She states that she feels she is managing this much better than what she was. 03-24-2021: The patient states that she is stable for the most part. Still has some "accidents" but is monitoring how much of the miralax she takes and is adjusting it to how her bowel patterns are. She watches closely and keeps a good check on things.  Discussed plans with patient for ongoing care management follow up and provided patient with direct contact information for care management team Advised patient to call the office for changes in urinary/bowel health, any sx or sx of infection or new concerns; Provided education to patient re: basic urinary/bowel healthy and management of urinary system/GI system ;  Reviewed medications with patient and discussed compliance. 03-24-2021: The patient is compliant with medications ; Reviewed scheduled/upcoming provider appointments including 07-11-2021 at 1:20 pm; Discussed plans with patient for ongoing care management follow up and provided patient with direct contact information for care management team;   Hyperlipidemia:  (Status: Goal on Track (progressing): YES.)      Lab Results  Component Value Date    CHOL 193 03/14/2021    HDL 75 03/14/2021    LDLCALC 98 03/14/2021    TRIG 100 03/14/2021    CHOLHDL 2.6 03/14/2021       Medication review performed; medication list updated in electronic medical record. 03-24-2021: Takes Simvastatin 40 mg daily  Provider established cholesterol goals reviewed. 03-24-2021: Review of lab work today and praised the patient for being at goal; Counseled on importance of regular laboratory monitoring as prescribed. 01-27-2021: Review of upcoming appointment and review of regular bloodwork for evaluation of cholesterol levels. 03-24-2021: The patient has had recent lipid panel. Review with the patient today. The patient is at goal.  Provided HLD educational materials; Reviewed role and benefits of statin for ASCVD risk reduction; Discussed strategies to manage statin-induced myalgias; Reviewed importance of limiting foods high in cholesterol. 03-24-2021: Review of heart healthy/ADA diet    Hypertension: (Status: Goal on Track (progressing): YES.) Last practice recorded BP readings:     BP Readings from Last 3 Encounters:  03/14/21 (!) 158/59  11/25/20 (!) 195/75  11/11/20 (!) 148/58  Most recent eGFR/CrCl:       Lab Results  Component Value Date    EGFR 53 (L) 03/14/2021    No components found for: CRCL   Evaluation of current treatment plan related to hypertension self management and patient's adherence to plan as established by provider. 01-27-2021: The patient has not been checking her blood pressures at home but denies headaches or other concerns related to HTN. The patient does state sometimes she eats too much sodium and should cut back on this. 03-24-2021: The patient states her blood pressures are better at home. She denies any headaches or other concerns with elevated blood pressures. She is monitoring her dietary intake and taking medications as directed. Will continue to monitor for changes.  ;   Reviewed prescribed diet heart healthy/ADA diet. 03-24-2021: Review of heart healthy/ADA diet  Reviewed medications with patient and discussed importance of compliance. 03-24-2021: States  compliance with medications   Discussed plans with patient for ongoing care management follow up and provided patient with direct contact information for care management team; Advised patient, providing education and rationale, to monitor blood pressure daily and record, calling PCP for findings outside established parameters. 01-27-2021: Education on the goal of blood pressures being <454 systolic and <09 diastolic. The patient states that she will see the neurologist soon and pcp in February. 03-24-2021: Recent visits with nephrologist and pcp. The patient is mindful of normal blood pressure readings.  ;  Advised patient to discuss blood pressure trends  with provider; Provided education on prescribed diet heart healthy/ADA diet. 03-24-2021: Review and education given ;  Discussed complications of poorly controlled blood pressure such as heart disease, stroke, circulatory complications, vision complications, kidney impairment, sexual dysfunction;      Pain:  (Status: Goal on Track (progressing): YES.) Long Term Goal  Pain assessment performed. 01-27-2021: The patient rates her overall pain at a 2 today on a scale of 0-10. States that different areas have different pain and they are not all painful at the same time.  She states that her right radius is the same. She got a new splint but when she got it on 12-23-2020 the first thing she did was take it off because it is the hard plastic and it makes her wrist stay straight and she could not do anything with it. She states she is going to talk to the neurologist about it and see what recommendations the neurologist gives her. She states that she wishes there was something that would help it but she does not know what that will be. Empathetic listening and support given. Will continue to monitor. 03-24-2021: The patient denies any pain the patient states that she is going on 03-26-2021 to start physical therapy. She had been doing physical therapy but she will go and do  this. She says her left hand is weaker now because she is so dependent on it but she feels like she is doing okay with it.  Medications reviewed. 03-24-2021: Takes pain medications when needed Reviewed provider established plan for pain management. 03-24-2021: the patient takes OTC medications when needed. She does not wish to take anything that she would become dependent on. She states she also has creams she uses when she needs to to help with pain relief.  Discussed importance of adherence to all scheduled medical appointments. 01-27-2021: Has upcoming appointment with neurologist and also sees the pcp in February. 07-11-2021 at 120 pm with pcp. Will see PT on 03-26-2021. ; Counseled on the importance of reporting any/all new or changed pain symptoms or management strategies to pain management provider; Advised patient to report to care team affect of pain on daily activities; Discussed use of relaxation techniques and/or diversional activities to assist with pain reduction (distraction, imagery, relaxation, massage, acupressure, TENS, heat, and cold application; Reviewed with patient prescribed pharmacological and nonpharmacological pain relief strategies; Advised patient to discuss unresolved pain, changes in level or intensity of pain with provider; Screening for signs and symptoms of depression related to chronic disease state;  Assessed social determinant of health barriers;     Patient Goals/Self-Care Activities: Patient will self administer medications as prescribed as evidenced by self report/primary caregiver report  Patient will attend all scheduled provider appointments as evidenced by clinician review of documented attendance to scheduled appointments and patient/caregiver report Patient will call pharmacy for medication refills as evidenced by patient report and review of pharmacy fill history as appropriate Patient will attend church or other social activities as evidenced by patient  report Patient will continue to perform ADL's independently as evidenced by patient/caregiver report Patient will continue to perform IADL's independently as evidenced by patient/caregiver report Patient will call provider office for new concerns or questions as evidenced by review of documented incoming telephone call notes and patient report Patient will work with BSW to address care coordination needs and will continue to work with the clinical team to address health care and disease management related needs as evidenced by documented adherence to scheduled care management/care coordination appointments - schedule appointment with eye doctor - check blood sugar at prescribed times: before meals and at bedtime, when you have symptoms of low or high blood sugar, and before and after exercise - check feet daily for cuts, sores or redness - enter blood sugar readings and medication or insulin into daily log - take the blood sugar log to all doctor visits - trim toenails straight across - drink 6 to 8 glasses of water each day - eat fish at least once per week - fill half  of plate with vegetables - limit fast food meals to no more than 1 per week - manage portion size - prepare main meal at home 3 to 5 days each week - read food labels for fat, fiber, carbohydrates and portion size - reduce red meat to 2 to 3 times a week - keep feet up while sitting - wash and dry feet carefully every day - wear comfortable, cotton socks - wear comfortable, well-fitting shoes - check blood pressure 3 times per week - choose a place to take my blood pressure (home, clinic or office, retail store) - write blood pressure results in a log or diary - learn about high blood pressure - keep a blood pressure log - take blood pressure log to all doctor appointments - call doctor for signs and symptoms of high blood pressure - develop an action plan for high blood pressure - keep all doctor appointments - take  medications for blood pressure exactly as prescribed - report new symptoms to your doctor - eat more whole grains, fruits and vegetables, lean meats and healthy fats - call for medicine refill 2 or 3 days before it runs out - take all medications exactly as prescribed - call doctor with any symptoms you believe are related to your medicine - call doctor when you experience any new symptoms - go to all doctor appointments as scheduled - adhere to prescribed diet: heart healthy/ADA diet       Our next appointment is by telephone on 05-26-2021 at 1 pm  Please call the care guide team at [phone] if you need to cancel or reschedule your appointment.   If you are experiencing a Mental Health or [place] or need someone to talk to, please call the Suicide and Crisis Lifeline: 988 call the [place] National Suicide Prevention Lifeline: 727-353-0268 or TTY: 343-174-2534 TTY 819-324-5768) to talk to a trained counselor call 1-800-273-TALK (toll free, 24 hour hotline)   Patient verbalizes understanding of instructions and care plan provided today and agrees to view in [place]. Active MyChart status confirmed with patient.    Noreene Larsson RN, MSN, [place] Mobile: [phone]

## 2021-03-26 DIAGNOSIS — M21331 Wrist drop, right wrist: Secondary | ICD-10-CM | POA: Diagnosis not present

## 2021-04-18 DIAGNOSIS — E1159 Type 2 diabetes mellitus with other circulatory complications: Secondary | ICD-10-CM | POA: Diagnosis not present

## 2021-04-18 DIAGNOSIS — E785 Hyperlipidemia, unspecified: Secondary | ICD-10-CM

## 2021-04-18 DIAGNOSIS — I10 Essential (primary) hypertension: Secondary | ICD-10-CM

## 2021-04-21 ENCOUNTER — Other Ambulatory Visit: Payer: Self-pay | Admitting: Family Medicine

## 2021-04-21 DIAGNOSIS — F418 Other specified anxiety disorders: Secondary | ICD-10-CM

## 2021-04-22 NOTE — Telephone Encounter (Signed)
Requested medication (s) are due for refill today: yes ? ?Requested medication (s) are on the active medication list: yes ? ?Last refill:  03/04/21 ? ?Future visit scheduled: yes ? ?Notes to clinic:  Unable to refill per protocol, see notes. Pt is requesting 10 mg. ? ? ?  ?Requested Prescriptions  ?Pending Prescriptions Disp Refills  ? escitalopram (LEXAPRO) 5 MG tablet [Pharmacy Med Name: ESCITALOPRAM OXALATE 5 MG TAB] 90 tablet   ?  Sig: TAKE 1 TABLET BY MOUTH ONCE DAILY WITH FOOD  ?  ? Psychiatry:  Antidepressants - SSRI Passed - 04/22/2021  4:09 PM  ?  ?  Passed - Completed PHQ-2 or PHQ-9 in the last 360 days  ?  ?  Passed - Valid encounter within last 6 months  ?  Recent Outpatient Visits   ? ?      ? 1 month ago Type 2 diabetes mellitus with stage 3a chronic kidney disease, without long-term current use of insulin ([place])  ? [place], DO  ? 5 months ago Vitamin B12 deficiency  ? [place], DO  ? 6 months ago Vitamin B12 deficiency  ? [place], DO  ? 7 months ago Vitamin B12 deficiency  ? [place], DO  ? 7 months ago Vitamin B12 deficiency  ? [place], DO  ? ?  ?  ?Future Appointments   ? ?        ? In 2 months Parks Ranger, [facility], [place]  ? In 7 months MacDiarmid, Nicki Reaper, MD [place]  ? ?  ? ?  ?  ?  ? ? ?

## 2021-04-22 NOTE — Telephone Encounter (Signed)
Requested medication (s) are due for refill today: yes ? ?Requested medication (s) are on the active medication list: yes ? ?Last refill:  03/04/21 ? ?Future visit scheduled: yes ? ?Notes to clinic: Unable to refill per protocol, see notes. Need updated dosage. ? ? ?  ?Requested Prescriptions  ?Pending Prescriptions Disp Refills  ? escitalopram (LEXAPRO) 5 MG tablet [Pharmacy Med Name: ESCITALOPRAM OXALATE 5 MG TAB] 90 tablet   ?  Sig: TAKE 1 TABLET BY MOUTH ONCE DAILY WITH FOOD  ?  ? Psychiatry:  Antidepressants - SSRI Passed - 04/22/2021  4:09 PM  ?  ?  Passed - Completed PHQ-2 or PHQ-9 in the last 360 days  ?  ?  Passed - Valid encounter within last 6 months  ?  Recent Outpatient Visits   ? ?      ? 1 month ago Type 2 diabetes mellitus with stage 3a chronic kidney disease, without long-term current use of insulin ([place])  ? [place], DO  ? 5 months ago Vitamin B12 deficiency  ? [place], DO  ? 6 months ago Vitamin B12 deficiency  ? [place], DO  ? 7 months ago Vitamin B12 deficiency  ? [place], DO  ? 7 months ago Vitamin B12 deficiency  ? [place], DO  ? ?  ?  ?Future Appointments   ? ?        ? In 2 months Parks Ranger, [facility], [place]  ? In 7 months MacDiarmid, Nicki Reaper, MD [place]  ? ?  ? ?  ?  ?  ? ? ?

## 2021-05-20 ENCOUNTER — Other Ambulatory Visit: Payer: Self-pay | Admitting: Family Medicine

## 2021-05-20 DIAGNOSIS — E1122 Type 2 diabetes mellitus with diabetic chronic kidney disease: Secondary | ICD-10-CM

## 2021-05-21 NOTE — Telephone Encounter (Signed)
Requested Prescriptions  ?Pending Prescriptions Disp Refills  ?? lisinopril (ZESTRIL) 5 MG tablet [Pharmacy Med Name: LISINOPRIL 5 MG TAB] 90 tablet 0  ?  Sig: TAKE 1 TABLET BY MOUTH ONCE DAILY  ?  ? Cardiovascular:  ACE Inhibitors Failed - 05/20/2021  6:36 PM  ?  ?  Failed - Cr in normal range and within 180 days  ?  Creat  ?Date Value Ref Range Status  ?03/14/2021 1.02 (H) 0.60 - 0.95 mg/dL Final  ?   ?  ?  Failed - Last BP in normal range  ?  BP Readings from Last 1 Encounters:  ?03/14/21 (!) 158/59  ?   ?  ?  Passed - K in normal range and within 180 days  ?  Potassium  ?Date Value Ref Range Status  ?03/14/2021 4.4 3.5 - 5.3 mmol/L Final  ?06/28/2012 4.2 3.5 - 5.1 mmol/L Final  ?   ?  ?  Passed - Patient is not pregnant  ?  ?  Passed - Valid encounter within last 6 months  ?  Recent Outpatient Visits   ?      ? 2 months ago Type 2 diabetes mellitus with stage 3a chronic kidney disease, without long-term current use of insulin ([place])  ? [place], DO  ? 6 months ago Vitamin B12 deficiency  ? [place], DO  ? 7 months ago Vitamin B12 deficiency  ? [place], DO  ? 8 months ago Vitamin B12 deficiency  ? [place], DO  ? 8 months ago Vitamin B12 deficiency  ? [place], DO  ?  ?  ?Future Appointments   ?        ? In 1 month Karamalegos, Devonne Doughty, DO [facility], [place]  ? In 6 months MacDiarmid, Nicki Reaper, MD [place]  ?  ? ?  ?  ?  ? ? ?

## 2021-05-26 ENCOUNTER — Telehealth: Payer: PPO

## 2021-05-26 ENCOUNTER — Ambulatory Visit (INDEPENDENT_AMBULATORY_CARE_PROVIDER_SITE_OTHER): Payer: PPO

## 2021-05-26 DIAGNOSIS — M158 Other polyosteoarthritis: Secondary | ICD-10-CM

## 2021-05-26 DIAGNOSIS — E1122 Type 2 diabetes mellitus with diabetic chronic kidney disease: Secondary | ICD-10-CM

## 2021-05-26 DIAGNOSIS — G5631 Lesion of radial nerve, right upper limb: Secondary | ICD-10-CM

## 2021-05-26 DIAGNOSIS — I10 Essential (primary) hypertension: Secondary | ICD-10-CM

## 2021-05-26 DIAGNOSIS — I129 Hypertensive chronic kidney disease with stage 1 through stage 4 chronic kidney disease, or unspecified chronic kidney disease: Secondary | ICD-10-CM

## 2021-05-26 DIAGNOSIS — E1169 Type 2 diabetes mellitus with other specified complication: Secondary | ICD-10-CM

## 2021-05-26 DIAGNOSIS — R32 Unspecified urinary incontinence: Secondary | ICD-10-CM

## 2021-05-26 NOTE — Chronic Care Management (AMB) (Signed)
?Chronic Care Management  ? ?CCM RN Visit Note ? ?05/26/2021 ?Name: Lisa Mills MRN: [id number] DOB: March 11, 1929 ? ?Subjective: ?Lisa Mills is a 88 y.o. year old female who is a primary care patient of Olin Hauser, DO. The care management team was consulted for assistance with disease management and care coordination needs.   ? ?Engaged with patient by telephone for follow up visit in response to provider referral for case management and/or care coordination services.  ? ?Consent to Services:  ?The patient was given information about Chronic Care Management services, agreed to services, and gave verbal consent prior to initiation of services.  Please see initial visit note for detailed documentation.  ? ?Patient agreed to services and verbal consent obtained.  ? ?Assessment: Review of patient past medical history, allergies, medications, health status, including review of consultants reports, laboratory and other test data, was performed as part of comprehensive evaluation and provision of chronic care management services.  ? ?SDOH (Social Determinants of Health) assessments and interventions performed:   ? ?CCM Care Plan ? ?Allergies  ?Allergen Reactions  ? Aspirin Other (See Comments)  ?  Burns stomach  ? Conray [Iothalamate] Hives  ?  IV dye conray-400  ? Dye Fdc Red [Red Dye] Hives  ? Sulfa Antibiotics   ?  Unknown Reaction, not used in years  ? Sulfasalazine   ?  Other reaction(s): Unknown  ? Prednisone   ?  Indigestion   ? ? ?Outpatient Encounter Medications as of 05/26/2021  ?Medication Sig  ? acetaminophen (TYLENOL) 500 MG tablet Take 1 tablet (500 mg total) by mouth every 12 (twelve) hours. (Patient not taking: Reported on 12/06/2020)  ? Blood Glucose Monitoring Suppl (ONE TOUCH ULTRA 2) w/Device KIT Use to check blood sugar as advised up to 2 times daily  ? Cholecalciferol (VITAMIN D) 50 MCG (2000 UT) CAPS Take 2,000 Units by mouth daily.   ? cyanocobalamin (,VITAMIN B-12,) 1000 MCG/ML  injection Inject 1 mL (1,000 mcg total) into the muscle every 30 (thirty) days. For 3 months, then need repeat B12 lab level  ? escitalopram (LEXAPRO) 10 MG tablet Take 1 tablet (10 mg total) by mouth daily. with food  ? ferrous sulfate 325 (65 FE) MG tablet Take 325 mg by mouth daily.   ? glucose blood (ONE TOUCH ULTRA TEST) test strip CHECK BLOOD SUGAR UP TO 2 TIMES A DAY.  ? glycerin adult 2 g suppository Place 1 suppository rectally as needed for constipation.  ? Histamine Dihydrochloride (AUSTRALIAN DREAM ARTHRITIS) 0.025 % CREA Apply 1 application topically daily as needed (Pain).  ? hydrocortisone (ANUSOL-HC) 2.5 % rectal cream USE 1 APPLICATION RECTALLY TWICE DAILY AS DIRECTED (Patient taking differently: Place 1 application rectally daily as needed for hemorrhoids or anal itching.)  ? levothyroxine (SYNTHROID) 25 MCG tablet Take 1 tablet (25 mcg total) by mouth daily before breakfast.  ? lisinopril (ZESTRIL) 5 MG tablet TAKE 1 TABLET BY MOUTH ONCE DAILY  ? nitrofurantoin (MACRODANTIN) 100 MG capsule Take 1 capsule (100 mg total) by mouth daily.  ? omeprazole (PRILOSEC) 20 MG capsule TAKE 1 CAPSULE BY MOUTH ONCE DAILY  ? OneTouch Delica Lancets 15V MISC Use to check blood sugar up to 2 times daily  ? polyethylene glycol (MIRALAX / GLYCOLAX) 17 g packet Take 17 g by mouth daily as needed for moderate constipation or severe constipation. (Patient taking differently: Take 17 g by mouth daily.)  ? simvastatin (ZOCOR) 40 MG tablet TAKE 1 TABLET BY MOUTH  AT BEDTIME  ? traZODone (DESYREL) 50 MG tablet Take 0.5-1 tablets (25-50 mg total) by mouth at bedtime as needed for sleep.  ? witch hazel-glycerin (TUCKS) pad Apply 1 application topically as needed for itching.  ? ?No facility-administered encounter medications on file as of 05/26/2021.  ? ? ?Patient Active Problem List  ? Diagnosis Date Noted  ? Right wrist drop 03/25/2020  ? Psychophysiological insomnia 10/06/2019  ? Nonintractable headache 10/06/2019  ? Urinary  incontinence 10/06/2019  ? Internal and external bleeding hemorrhoids 08/15/2019  ? Sacral pain   ? Radicular pain of sacrum 07/11/2019  ? Sacral fracture, closed ([place]) 07/04/2019  ? Fall 07/04/2019  ? Bilateral pubic rami fractures, sequela 07/04/2019  ? Background diabetic retinopathy ([place]) 01/23/2019  ? Schatzki's ring 03/04/2017  ? Post herpetic neuralgia 10/06/2016  ? Hx of colonic polyps 09/22/2016  ? Osteopenia 09/02/2016  ? Anxiety associated with depression 08/25/2016  ? Trapezius muscle spasm 05/25/2016  ? Intermittent left lower quadrant abdominal pain 03/16/2016  ? Anemia in chronic kidney disease (CKD) 02/26/2016  ? Type 2 diabetes with stage 3 chronic kidney disease GFR 30-59 ([place]) 02/25/2016  ? Hypothyroidism 02/25/2016  ? Constipation 02/25/2016  ? GERD (gastroesophageal reflux disease) 02/25/2016  ? Hyperlipidemia associated with type 2 diabetes mellitus ([place]) 02/25/2016  ? DJD (degenerative joint disease) of cervical spine 02/25/2016  ? Osteoarthritis of multiple joints 02/25/2016  ? Hiatal hernia 02/25/2016  ? CKD stage 3 due to type 2 diabetes mellitus ([place]) 02/25/2016  ? Chronic kidney disease, unspecified 09/05/2013  ? Benign hypertension with CKD (chronic kidney disease) stage III ([place]) 09/05/2013  ? CMC arthritis, thumb, degenerative 06/15/2013  ? Onychomycosis due to dermatophyte 05/25/2013  ? Acquired keratoderma 05/25/2013  ? ? ?Conditions to be addressed/monitored:HTN, HLD, DMII, and Chronic pain and urinary incontinence  ? ?Care Plan : RNCM: Adult plan of care for Chronic Disease Management and Care Coordination Needs  ?Updates made by Vanita Ingles, RN since 05/26/2021 12:00 AM  ?  ? ?Problem: RNCM: Development of Plan of Care for Chronic Disease Management (HTN, HLD, DM, Falls, Chronic pain, Urinary Incontinence)   ?Priority: High  ?  ? ?Long-Range Goal: RNCM: Effective management  of Plan of Care for Chronic Disease Management (HTN, HLD, DM, Falls, Chronic pain,  Urinary Incontinence)    ?Start Date: 11/25/2020  ?Expected End Date: 11/25/2021  ?Priority: High  ?Note:   ?Current Barriers:  ?Chronic Disease Management support and education needs related to HTN, HLD, DMII, Chronic pain, and Falls Prevention and Urinary Incontience  ? ?RNCM Clinical Goal(s):  ?Patient will verbalize understanding of plan for management of HTN, HLD, DMII, Chronic pain, falls prevention, Overactive Bladder, and urinary incontience  as evidenced by following plan of care, taking medications as directed, and dietary restrictions ?demonstrate understanding of rationale for each prescribed medication as evidenced by compliance with medications and calling for refills before running out of medications     ?attend all scheduled medical appointments: 07-11-2021 at 1:20 pm as evidenced by keeping appointments         ?demonstrate improved and ongoing adherence to prescribed treatment plan for HTN, HLD, DMII, Chronic pain, Overactive Bladder, and falls prevention and safety as evidenced by compliance with the plan of care and working with the CCM team for effective management of chronic conditions  ?demonstrate a decrease in HTN, HLD, DMII, Overactive Bladder, Chronic pain, and falls and safety exacerbations  as evidenced by effective management of chronic conditions and working with the  CCM team for health and well being ?demonstrate ongoing self health care management ability effective management of chronic conditions as evidenced by working with the CCM team through collaboration with Consulting civil engineer, provider, and care team.  ? ?Interventions: ?1:1 collaboration with primary care provider regarding development and update of comprehensive plan of care as evidenced by provider attestation and co-signature ?Inter-disciplinary care team collaboration (see longitudinal plan of care) ?Evaluation of current treatment plan related to  self management and patient's adherence to plan as established by provider ? ? ?SDOH Barriers (Status:  Goal on Track (progressing): YES.) Long Term Goal  ?Patient interviewed and SDOH assessment performed ?       ?Patient interviewed and appropriate assessments performed ?Provided patient with informatio

## 2021-05-26 NOTE — Patient Instructions (Signed)
Visit Information ? ?Thank you for taking time to visit with me today. Please don't hesitate to contact me if I can be of assistance to you before our next scheduled telephone appointment. ? ?Following are the goals we discussed today:  ?RNCM Clinical Goal(s):  ?Patient will verbalize understanding of plan for management of HTN, HLD, DMII, Chronic pain, falls prevention, Overactive Bladder, and urinary incontience  as evidenced by following plan of care, taking medications as directed, and dietary restrictions ?demonstrate understanding of rationale for each prescribed medication as evidenced by compliance with medications and calling for refills before running out of medications     ?attend all scheduled medical appointments: 07-11-2021 at 1:20 pm as evidenced by keeping appointments         ?demonstrate improved and ongoing adherence to prescribed treatment plan for HTN, HLD, DMII, Chronic pain, Overactive Bladder, and falls prevention and safety as evidenced by compliance with the plan of care and working with the CCM team for effective management of chronic conditions  ?demonstrate a decrease in HTN, HLD, DMII, Overactive Bladder, Chronic pain, and falls and safety exacerbations  as evidenced by effective management of chronic conditions and working with the CCM team for health and well being ?demonstrate ongoing self health care management ability effective management of chronic conditions as evidenced by working with the CCM team through collaboration with Consulting civil engineer, provider, and care team.  ?  ?Interventions: ?1:1 collaboration with primary care provider regarding development and update of comprehensive plan of care as evidenced by provider attestation and co-signature ?Inter-disciplinary care team collaboration (see longitudinal plan of care) ?Evaluation of current treatment plan related to  self management and patient's adherence to plan as established by provider ?  ?  ?SDOH Barriers (Status: Goal on  Track (progressing): YES.) Long Term Goal  ?Patient interviewed and SDOH assessment performed ?       ?Patient interviewed and appropriate assessments performed ?Provided patient with information about resources available in [facility] and care guides being available for assistance for new needs or concerns ?Discussed plans with patient for ongoing care management follow up and provided patient with direct contact information for care management team ?Advised patient to to call the the office for changes for SDOH, questions, and concerns. 01-27-2021: The patient is driving to her appointments and has help when needed for transportation needs ?  ?  ?  ?Diabetes:  (Status: Goal on Track (progressing): YES.) Long Term Goal  ?  ?     ?Lab Results  ?Component Value Date  ?  HGBA1C 7.5 (H) 03/14/2021  ?Previous 8.0 ?Assessed patient's understanding of A1c goal: <7%. 05-26-2021: The patient aware of A1C goals. Has lab work on a regular basis.  ?Provided education to patient about basic DM disease process. 05-26-2021: The patient is doing well and denies any needs with management of her DM. Doesn't always check her blood sugars, states she forgets. Denies any acute findings today. Will continue to monitor; ?Reviewed medications with patient and discussed importance of medication adherence. 03-24-2021: the patient takes medications as directed. ;        ?Reviewed prescribed diet with patient heart healthy/ADA diet. 01-27-2021: The patient states that she tries to watch what she is eating but sometimes she eats too much salt and other things she should not. She ate well over the holidays. The patient denies any episodes of shaking or being sweaty. 03-24-2021: The patient is doing better with her food choices. The patient praised for A1C  decreasing. The patient states that she is not drinking as much water as she doesn't want her sodium level to be off. The patient did have low sodium on recent lab work. Education and support given.  Will continue to monitor.05-26-2021: States that she is feeling good and doing well at this time. Denies any new concerns ?Counseled on importance of regular laboratory monitoring as prescribed. 01-27-2021: The patient will have more lab work at next follow up with the pcp. Education and support given. 03-24-2021: The patient had recent lab work. Reviewed with the patient today. The patient verbalized understanding.         ?Discussed plans with patient for ongoing care management follow up and provided patient with direct contact information for care management team;      ?Provided patient with written educational materials related to hypo and hyperglycemia and importance of correct treatment. 05-26-2021: The patient knows how to actively treat hypo and hyperglycemia.  Denies any rea highs or lows. States that she forgets to check       ?Reviewed scheduled/upcoming provider appointments including: 07-11-2021 at 1:20 pm;         ?Advised patient, providing education and rationale, to check cbg as directed  and record. 05-26-2021: The patient has not been taking her blood sugars. It is hard for her to do with her right radius having wrist drop. She states she does not know what it will be but she is eating good and taking her medications. Education and support given.        ?call provider for findings outside established parameters;       ?Review of patient status, including review of consultants reports, relevant laboratory and other test results, and medications completed;       ?Screening for signs and symptoms of depression related to chronic disease state;        ?Assessed social determinant of health barriers;        ?  ?Falls:  (Status: Goal Met. No Needs Identified this visit) Long Term Goal  Goal met at this time. Will continue to monitor for changes and new needs.  ?Provided written and verbal education re: potential causes of falls and Fall prevention strategies ?Reviewed medications and discussed potential side  effects of medications such as dizziness and frequent urination ?Advised patient of importance of notifying provider of falls ?Assessed for signs and symptoms of orthostatic hypotension ?Assessed for falls since last encounter. 01-27-2021: The patient denies any new falls. States she is being careful especially when she takes her dog out. She only takes him for a walk about one time a day.  ?Assessed patients knowledge of fall risk prevention secondary to previously provided education ?Provided patient information for fall alert systems ?  ?OAB/Urinary Incontinence/Fecal incontinence    (Status: Goal on Track (progressing): YES.) Long Term Goal  ?Evaluation of current treatment plan related to Overactive Bladder and fecal incontinence,  self-management and patient's adherence to plan as established by provider. 01-27-2021: the patient states her OAB/Urinary and fecal Incontinence is stable. She sometimes has to go quickly and has bowel movement/diarrhea at the same time. The patient states that she makes sure she cleans herself good and she has a herbal baby wipe that works well for her. She states that she feels she is managing this much better than what she was. 05-26-2021: The patient states that she is stable for the most part. Still has some "accidents" but is monitoring how much of the miralax she takes  and is adjusting it to how her bowel patterns are. She watches closely and keeps a good check on things.  ?Discussed plans with patient for ongoing care management follow up and provided patient with direct contact information for care management team ?Advised patient to call the office for changes in urinary/bowel health, any sx or sx of infection or new concerns; ?Provided education to patient re: basic urinary/bowel healthy and management of urinary system/GI system ; ?Reviewed medications with patient and discussed compliance. 03-24-2021: The patient is compliant with medications ; ?Reviewed scheduled/upcoming  provider appointments including 07-11-2021 at 1:20 pm; ?Discussed plans with patient for ongoing care management follow up and provided patient with direct contact information for care management team; ?  ?Hy

## 2021-06-18 DIAGNOSIS — E785 Hyperlipidemia, unspecified: Secondary | ICD-10-CM

## 2021-06-18 DIAGNOSIS — Z7984 Long term (current) use of oral hypoglycemic drugs: Secondary | ICD-10-CM | POA: Diagnosis not present

## 2021-06-18 DIAGNOSIS — I10 Essential (primary) hypertension: Secondary | ICD-10-CM | POA: Diagnosis not present

## 2021-06-18 DIAGNOSIS — E1169 Type 2 diabetes mellitus with other specified complication: Secondary | ICD-10-CM | POA: Diagnosis not present

## 2021-07-11 ENCOUNTER — Ambulatory Visit: Payer: PPO | Admitting: Family Medicine

## 2021-07-12 ENCOUNTER — Other Ambulatory Visit: Payer: Self-pay

## 2021-07-12 ENCOUNTER — Emergency Department: Payer: PPO

## 2021-07-12 ENCOUNTER — Inpatient Hospital Stay
Admission: EM | Admit: 2021-07-12 | Discharge: 2021-07-15 | DRG: 481 | Disposition: A | Discharge status: Skilled Nursing Facility | Payer: PPO | Attending: Obstetrics and Gynecology | Admitting: Obstetrics and Gynecology

## 2021-07-12 DIAGNOSIS — E039 Hypothyroidism, unspecified: Secondary | ICD-10-CM | POA: Diagnosis not present

## 2021-07-12 DIAGNOSIS — Z886 Allergy status to analgesic agent status: Secondary | ICD-10-CM

## 2021-07-12 DIAGNOSIS — N1831 Chronic kidney disease, stage 3a: Secondary | ICD-10-CM | POA: Diagnosis present

## 2021-07-12 DIAGNOSIS — I10 Essential (primary) hypertension: Secondary | ICD-10-CM | POA: Diagnosis present

## 2021-07-12 DIAGNOSIS — Z87440 Personal history of urinary (tract) infections: Secondary | ICD-10-CM | POA: Diagnosis not present

## 2021-07-12 DIAGNOSIS — N390 Urinary tract infection, site not specified: Secondary | ICD-10-CM | POA: Diagnosis present

## 2021-07-12 DIAGNOSIS — E7800 Pure hypercholesterolemia, unspecified: Secondary | ICD-10-CM | POA: Diagnosis not present

## 2021-07-12 DIAGNOSIS — Z9102 Food additives allergy status: Secondary | ICD-10-CM

## 2021-07-12 DIAGNOSIS — S72402A Unspecified fracture of lower end of left femur, initial encounter for closed fracture: Secondary | ICD-10-CM | POA: Diagnosis not present

## 2021-07-12 DIAGNOSIS — K219 Gastro-esophageal reflux disease without esophagitis: Secondary | ICD-10-CM | POA: Diagnosis present

## 2021-07-12 DIAGNOSIS — Z79890 Hormone replacement therapy (postmenopausal): Secondary | ICD-10-CM

## 2021-07-12 DIAGNOSIS — R9431 Abnormal electrocardiogram [ECG] [EKG]: Secondary | ICD-10-CM | POA: Diagnosis not present

## 2021-07-12 DIAGNOSIS — S72002A Fracture of unspecified part of neck of left femur, initial encounter for closed fracture: Secondary | ICD-10-CM | POA: Diagnosis not present

## 2021-07-12 DIAGNOSIS — E1169 Type 2 diabetes mellitus with other specified complication: Secondary | ICD-10-CM | POA: Diagnosis present

## 2021-07-12 DIAGNOSIS — Z882 Allergy status to sulfonamides status: Secondary | ICD-10-CM | POA: Diagnosis not present

## 2021-07-12 DIAGNOSIS — E559 Vitamin D deficiency, unspecified: Secondary | ICD-10-CM | POA: Diagnosis not present

## 2021-07-12 DIAGNOSIS — Y92009 Unspecified place in unspecified non-institutional (private) residence as the place of occurrence of the external cause: Secondary | ICD-10-CM | POA: Diagnosis not present

## 2021-07-12 DIAGNOSIS — Z79899 Other long term (current) drug therapy: Secondary | ICD-10-CM | POA: Diagnosis not present

## 2021-07-12 DIAGNOSIS — G4700 Insomnia, unspecified: Secondary | ICD-10-CM | POA: Diagnosis not present

## 2021-07-12 DIAGNOSIS — M8580 Other specified disorders of bone density and structure, unspecified site: Secondary | ICD-10-CM | POA: Diagnosis present

## 2021-07-12 DIAGNOSIS — D62 Acute posthemorrhagic anemia: Secondary | ICD-10-CM | POA: Diagnosis not present

## 2021-07-12 DIAGNOSIS — K5900 Constipation, unspecified: Secondary | ICD-10-CM | POA: Diagnosis not present

## 2021-07-12 DIAGNOSIS — E440 Moderate protein-calorie malnutrition: Secondary | ICD-10-CM | POA: Insufficient documentation

## 2021-07-12 DIAGNOSIS — S0990XA Unspecified injury of head, initial encounter: Secondary | ICD-10-CM | POA: Diagnosis not present

## 2021-07-12 DIAGNOSIS — E871 Hypo-osmolality and hyponatremia: Secondary | ICD-10-CM

## 2021-07-12 DIAGNOSIS — E785 Hyperlipidemia, unspecified: Secondary | ICD-10-CM | POA: Diagnosis present

## 2021-07-12 DIAGNOSIS — S7292XA Unspecified fracture of left femur, initial encounter for closed fracture: Secondary | ICD-10-CM | POA: Diagnosis present

## 2021-07-12 DIAGNOSIS — I129 Hypertensive chronic kidney disease with stage 1 through stage 4 chronic kidney disease, or unspecified chronic kidney disease: Secondary | ICD-10-CM | POA: Diagnosis not present

## 2021-07-12 DIAGNOSIS — S72332A Displaced oblique fracture of shaft of left femur, initial encounter for closed fracture: Secondary | ICD-10-CM | POA: Diagnosis not present

## 2021-07-12 DIAGNOSIS — Z91041 Radiographic dye allergy status: Secondary | ICD-10-CM | POA: Diagnosis not present

## 2021-07-12 DIAGNOSIS — W010XXA Fall on same level from slipping, tripping and stumbling without subsequent striking against object, initial encounter: Secondary | ICD-10-CM | POA: Diagnosis present

## 2021-07-12 DIAGNOSIS — S72302A Unspecified fracture of shaft of left femur, initial encounter for closed fracture: Principal | ICD-10-CM | POA: Diagnosis present

## 2021-07-12 DIAGNOSIS — F32A Depression, unspecified: Secondary | ICD-10-CM | POA: Diagnosis present

## 2021-07-12 DIAGNOSIS — R5381 Other malaise: Secondary | ICD-10-CM | POA: Diagnosis not present

## 2021-07-12 DIAGNOSIS — Z6822 Body mass index (BMI) 22.0-22.9, adult: Secondary | ICD-10-CM | POA: Diagnosis not present

## 2021-07-12 DIAGNOSIS — D631 Anemia in chronic kidney disease: Secondary | ICD-10-CM | POA: Diagnosis present

## 2021-07-12 DIAGNOSIS — W19XXXA Unspecified fall, initial encounter: Secondary | ICD-10-CM

## 2021-07-12 DIAGNOSIS — S80919A Unspecified superficial injury of unspecified knee, initial encounter: Secondary | ICD-10-CM | POA: Diagnosis not present

## 2021-07-12 DIAGNOSIS — Z743 Need for continuous supervision: Secondary | ICD-10-CM | POA: Diagnosis not present

## 2021-07-12 DIAGNOSIS — E222 Syndrome of inappropriate secretion of antidiuretic hormone: Secondary | ICD-10-CM | POA: Diagnosis not present

## 2021-07-12 DIAGNOSIS — Z7901 Long term (current) use of anticoagulants: Secondary | ICD-10-CM | POA: Diagnosis not present

## 2021-07-12 DIAGNOSIS — E1122 Type 2 diabetes mellitus with diabetic chronic kidney disease: Secondary | ICD-10-CM | POA: Diagnosis not present

## 2021-07-12 DIAGNOSIS — W19XXXD Unspecified fall, subsequent encounter: Secondary | ICD-10-CM | POA: Diagnosis not present

## 2021-07-12 DIAGNOSIS — S72302D Unspecified fracture of shaft of left femur, subsequent encounter for closed fracture with routine healing: Secondary | ICD-10-CM | POA: Diagnosis not present

## 2021-07-12 DIAGNOSIS — M1612 Unilateral primary osteoarthritis, left hip: Secondary | ICD-10-CM | POA: Diagnosis not present

## 2021-07-12 DIAGNOSIS — K449 Diaphragmatic hernia without obstruction or gangrene: Secondary | ICD-10-CM | POA: Diagnosis not present

## 2021-07-12 DIAGNOSIS — G2581 Restless legs syndrome: Secondary | ICD-10-CM | POA: Diagnosis not present

## 2021-07-12 DIAGNOSIS — N183 Chronic kidney disease, stage 3 unspecified: Secondary | ICD-10-CM | POA: Diagnosis present

## 2021-07-12 DIAGNOSIS — R531 Weakness: Secondary | ICD-10-CM | POA: Diagnosis not present

## 2021-07-12 DIAGNOSIS — S7290XA Unspecified fracture of unspecified femur, initial encounter for closed fracture: Secondary | ICD-10-CM | POA: Diagnosis present

## 2021-07-12 LAB — CBC WITH DIFFERENTIAL/PLATELET
Abs Immature Granulocytes: 0.04 10*3/uL (ref 0.00–0.07)
Basophils Absolute: 0.1 10*3/uL (ref 0.0–0.1)
Basophils Relative: 1 %
Eosinophils Absolute: 0.2 10*3/uL (ref 0.0–0.5)
Eosinophils Relative: 2 %
HCT: 29.1 % — ABNORMAL LOW (ref 36.0–46.0)
Hemoglobin: 9.9 g/dL — ABNORMAL LOW (ref 12.0–15.0)
Immature Granulocytes: 1 %
Lymphocytes Relative: 41 %
Lymphs Abs: 3.3 10*3/uL (ref 0.7–4.0)
MCH: 30.8 pg (ref 26.0–34.0)
MCHC: 34 g/dL (ref 30.0–36.0)
MCV: 90.7 fL (ref 80.0–100.0)
Monocytes Absolute: 0.7 10*3/uL (ref 0.1–1.0)
Monocytes Relative: 9 %
Neutro Abs: 3.8 10*3/uL (ref 1.7–7.7)
Neutrophils Relative %: 46 %
Platelets: 283 10*3/uL (ref 150–400)
RBC: 3.21 MIL/uL — ABNORMAL LOW (ref 3.87–5.11)
RDW: 12.4 % (ref 11.5–15.5)
WBC: 8.1 10*3/uL (ref 4.0–10.5)
nRBC: 0 % (ref 0.0–0.2)

## 2021-07-12 LAB — BASIC METABOLIC PANEL
Anion gap: 11 (ref 5–15)
BUN: 18 mg/dL (ref 8–23)
CO2: 23 mmol/L (ref 22–32)
Calcium: 9.5 mg/dL (ref 8.9–10.3)
Chloride: 93 mmol/L — ABNORMAL LOW (ref 98–111)
Creatinine, Ser: 0.98 mg/dL (ref 0.44–1.00)
GFR, Estimated: 55 mL/min — ABNORMAL LOW (ref >60)
Glucose, Bld: 145 mg/dL — ABNORMAL HIGH (ref 70–99)
Potassium: 4.2 mmol/L (ref 3.5–5.1)
Sodium: 127 mmol/L — ABNORMAL LOW (ref 135–145)

## 2021-07-12 MED ORDER — MORPHINE SULFATE (PF) 4 MG/ML IV SOLN
2.5000 mg | Freq: Once | INTRAVENOUS | Status: AC
  Administered 2021-07-12: 2.5 mg via INTRAVENOUS
  Filled 2021-07-12: qty 1

## 2021-07-12 MED ORDER — ONDANSETRON HCL 4 MG/2ML IJ SOLN
4.0000 mg | Freq: Once | INTRAMUSCULAR | Status: AC
  Administered 2021-07-12: 4 mg via INTRAVENOUS
  Filled 2021-07-12: qty 2

## 2021-07-12 MED ORDER — MORPHINE SULFATE (PF) 2 MG/ML IV SOLN
2.0000 mg | Freq: Once | INTRAVENOUS | Status: AC
  Administered 2021-07-13: 2 mg via INTRAVENOUS
  Filled 2021-07-12: qty 1

## 2021-07-12 MED ORDER — CEFAZOLIN SODIUM-DEXTROSE 2-4 GM/100ML-% IV SOLN: 2.0000 g | INTRAVENOUS | Status: DC

## 2021-07-12 MED ORDER — ACETAMINOPHEN 325 MG PO TABS
650.0000 mg | ORAL_TABLET | Freq: Once | ORAL | Status: AC
  Administered 2021-07-13: 650 mg via ORAL
  Filled 2021-07-12: qty 2

## 2021-07-12 NOTE — ED Triage Notes (Signed)
Pt fell while walking dog, Felt pain in LLE, pain in thigh and knee. Pt stating they hit their head on the door but no LOC. Pt alert and oriented on scene

## 2021-07-13 ENCOUNTER — Other Ambulatory Visit: Payer: Self-pay

## 2021-07-13 ENCOUNTER — Encounter: Payer: Self-pay | Admitting: Internal Medicine

## 2021-07-13 ENCOUNTER — Inpatient Hospital Stay: Payer: PPO

## 2021-07-13 ENCOUNTER — Inpatient Hospital Stay: Payer: PPO | Admitting: Anesthesiology

## 2021-07-13 ENCOUNTER — Encounter
Admission: EM | Disposition: A | Discharge status: Skilled Nursing Facility | Payer: Self-pay | Source: Home / Self Care | Attending: Obstetrics and Gynecology

## 2021-07-13 DIAGNOSIS — S72002A Fracture of unspecified part of neck of left femur, initial encounter for closed fracture: Secondary | ICD-10-CM | POA: Diagnosis not present

## 2021-07-13 DIAGNOSIS — S7292XA Unspecified fracture of left femur, initial encounter for closed fracture: Secondary | ICD-10-CM | POA: Diagnosis present

## 2021-07-13 DIAGNOSIS — F32A Depression, unspecified: Secondary | ICD-10-CM | POA: Diagnosis not present

## 2021-07-13 DIAGNOSIS — I10 Essential (primary) hypertension: Secondary | ICD-10-CM | POA: Diagnosis present

## 2021-07-13 DIAGNOSIS — E871 Hypo-osmolality and hyponatremia: Secondary | ICD-10-CM

## 2021-07-13 HISTORY — PX: INTRAMEDULLARY (IM) NAIL INTERTROCHANTERIC: SHX5875

## 2021-07-13 LAB — GLUCOSE, CAPILLARY
Glucose-Capillary: 228 mg/dL — ABNORMAL HIGH (ref 70–99)
Glucose-Capillary: 178 mg/dL — ABNORMAL HIGH (ref 70–99)
Glucose-Capillary: 138 mg/dL — ABNORMAL HIGH (ref 70–99)

## 2021-07-13 LAB — SURGICAL PCR SCREEN
MRSA, PCR: NEGATIVE
Staphylococcus aureus: NEGATIVE

## 2021-07-13 LAB — OSMOLALITY, URINE: Osmolality, Ur: 484 mOsm/kg (ref 300–900)

## 2021-07-13 LAB — SODIUM, URINE, RANDOM: Sodium, Ur: 70 mmol/L

## 2021-07-13 SURGERY — FIXATION, FRACTURE, INTERTROCHANTERIC, WITH INTRAMEDULLARY ROD
Anesthesia: Spinal | Site: Hip | Laterality: Left

## 2021-07-13 MED ORDER — FLEET ENEMA 7-19 GM/118ML RE ENEM: 1.0000 | ENEMA | Freq: Once | RECTAL | Status: DC | PRN

## 2021-07-13 MED ORDER — DIPHENHYDRAMINE HCL 12.5 MG/5ML PO ELIX: 12.5000 mg | ORAL_SOLUTION | ORAL | Status: DC | PRN

## 2021-07-13 MED ORDER — ENOXAPARIN SODIUM 40 MG/0.4ML IJ SOSY
40.0000 mg | PREFILLED_SYRINGE | INTRAMUSCULAR | Status: DC
  Administered 2021-07-14 – 2021-07-15 (×2): 40 mg via SUBCUTANEOUS
  Filled 2021-07-13 (×2): qty 0.4

## 2021-07-13 MED ORDER — ACETAMINOPHEN 500 MG PO TABS
500.0000 mg | ORAL_TABLET | Freq: Four times a day (QID) | ORAL | Status: AC
  Administered 2021-07-13 – 2021-07-14 (×3): 500 mg via ORAL
  Filled 2021-07-13 (×5): qty 1

## 2021-07-13 MED ORDER — ACETAMINOPHEN 10 MG/ML IV SOLN
INTRAVENOUS | Status: AC
  Filled 2021-07-13: qty 100

## 2021-07-13 MED ORDER — 0.9 % SODIUM CHLORIDE (POUR BTL) OPTIME
TOPICAL | Status: DC | PRN
  Administered 2021-07-13: 1000 mL

## 2021-07-13 MED ORDER — CEFAZOLIN SODIUM-DEXTROSE 2-4 GM/100ML-% IV SOLN
2.0000 g | Freq: Four times a day (QID) | INTRAVENOUS | Status: AC
  Administered 2021-07-13 – 2021-07-14 (×3): 2 g via INTRAVENOUS
  Filled 2021-07-13 (×3): qty 100

## 2021-07-13 MED ORDER — LEVOTHYROXINE SODIUM 25 MCG PO TABS
25.0000 ug | ORAL_TABLET | Freq: Every day | ORAL | Status: DC
  Administered 2021-07-14 – 2021-07-15 (×2): 25 ug via ORAL
  Filled 2021-07-13 (×2): qty 1

## 2021-07-13 MED ORDER — INSULIN ASPART 100 UNIT/ML IJ SOLN
0.0000 [IU] | INTRAMUSCULAR | Status: DC
  Administered 2021-07-13: 1 [IU] via SUBCUTANEOUS
  Administered 2021-07-13: 2 [IU] via SUBCUTANEOUS
  Filled 2021-07-13 (×2): qty 1

## 2021-07-13 MED ORDER — ONDANSETRON HCL 4 MG/2ML IJ SOLN
INTRAMUSCULAR | Status: DC | PRN
  Administered 2021-07-13: 4 mg via INTRAVENOUS

## 2021-07-13 MED ORDER — FENTANYL CITRATE (PF) 100 MCG/2ML IJ SOLN
25.0000 ug | INTRAMUSCULAR | Status: DC | PRN
  Administered 2021-07-13: 25 ug via INTRAVENOUS

## 2021-07-13 MED ORDER — FENTANYL CITRATE (PF) 100 MCG/2ML IJ SOLN
INTRAMUSCULAR | Status: AC
  Administered 2021-07-13: 25 ug via INTRAVENOUS
  Filled 2021-07-13: qty 2

## 2021-07-13 MED ORDER — BISACODYL 10 MG RE SUPP: 10.0000 mg | Freq: Every day | RECTAL | Status: DC | PRN

## 2021-07-13 MED ORDER — ONDANSETRON HCL 4 MG/2ML IJ SOLN: 4.0000 mg | Freq: Once | INTRAMUSCULAR | Status: DC | PRN

## 2021-07-13 MED ORDER — CEFAZOLIN SODIUM-DEXTROSE 2-4 GM/100ML-% IV SOLN
INTRAVENOUS | Status: AC
  Filled 2021-07-13: qty 100

## 2021-07-13 MED ORDER — MAGNESIUM HYDROXIDE 400 MG/5ML PO SUSP
30.0000 mL | Freq: Every day | ORAL | Status: DC | PRN
Start: 2021-07-13 — End: 2021-07-15
  Administered 2021-07-14: 30 mL via ORAL
  Filled 2021-07-13: qty 30

## 2021-07-13 MED ORDER — NITROFURANTOIN MACROCRYSTAL 50 MG PO CAPS: 100.0000 mg | ORAL_CAPSULE | Freq: Every day | ORAL | Status: DC

## 2021-07-13 MED ORDER — BUPIVACAINE HCL (PF) 0.5 % IJ SOLN
INTRAMUSCULAR | Status: DC | PRN
  Administered 2021-07-13: 2.2 mL via INTRATHECAL

## 2021-07-13 MED ORDER — PROPOFOL 500 MG/50ML IV EMUL
INTRAVENOUS | Status: DC | PRN
  Administered 2021-07-13: 30 ug/kg/min via INTRAVENOUS

## 2021-07-13 MED ORDER — METOCLOPRAMIDE HCL 10 MG PO TABS: 5.0000 mg | ORAL_TABLET | Freq: Three times a day (TID) | ORAL | Status: DC | PRN

## 2021-07-13 MED ORDER — PANTOPRAZOLE SODIUM 40 MG PO TBEC
40.0000 mg | DELAYED_RELEASE_TABLET | Freq: Every day | ORAL | Status: DC
  Administered 2021-07-13 – 2021-07-15 (×3): 40 mg via ORAL
  Filled 2021-07-13 (×3): qty 1

## 2021-07-13 MED ORDER — CEFAZOLIN SODIUM-DEXTROSE 2-3 GM-%(50ML) IV SOLR
INTRAVENOUS | Status: DC | PRN
  Administered 2021-07-13: 2 g via INTRAVENOUS

## 2021-07-13 MED ORDER — PROPOFOL 1000 MG/100ML IV EMUL
INTRAVENOUS | Status: AC
  Filled 2021-07-13: qty 100

## 2021-07-13 MED ORDER — ONDANSETRON HCL 4 MG/2ML IJ SOLN
INTRAMUSCULAR | Status: AC
  Filled 2021-07-13: qty 2

## 2021-07-13 MED ORDER — MORPHINE SULFATE (PF) 2 MG/ML IV SOLN
0.5000 mg | INTRAVENOUS | Status: DC | PRN
  Administered 2021-07-13: 0.5 mg via INTRAVENOUS
  Filled 2021-07-13: qty 1

## 2021-07-13 MED ORDER — METOCLOPRAMIDE HCL 5 MG/ML IJ SOLN: 5.0000 mg | Freq: Three times a day (TID) | INTRAMUSCULAR | Status: DC | PRN

## 2021-07-13 MED ORDER — DOCUSATE SODIUM 100 MG PO CAPS
100.0000 mg | ORAL_CAPSULE | Freq: Two times a day (BID) | ORAL | Status: DC
  Administered 2021-07-13 – 2021-07-15 (×4): 100 mg via ORAL
  Filled 2021-07-13 (×4): qty 1

## 2021-07-13 MED ORDER — PROPOFOL 10 MG/ML IV BOLUS
INTRAVENOUS | Status: DC | PRN
  Administered 2021-07-13 (×3): 10 mg via INTRAVENOUS

## 2021-07-13 MED ORDER — HYDRALAZINE HCL 20 MG/ML IJ SOLN
5.0000 mg | INTRAMUSCULAR | Status: DC | PRN
  Administered 2021-07-13: 5 mg via INTRAVENOUS
  Filled 2021-07-13: qty 1

## 2021-07-13 MED ORDER — SIMVASTATIN 20 MG PO TABS
40.0000 mg | ORAL_TABLET | Freq: Every day | ORAL | Status: DC
  Administered 2021-07-13 – 2021-07-14 (×2): 40 mg via ORAL
  Filled 2021-07-13 (×2): qty 2

## 2021-07-13 MED ORDER — TRAZODONE HCL 50 MG PO TABS: 25.0000 mg | ORAL_TABLET | Freq: Every evening | ORAL | Status: DC | PRN

## 2021-07-13 MED ORDER — MUPIROCIN 2 % EX OINT
1.0000 | TOPICAL_OINTMENT | Freq: Two times a day (BID) | CUTANEOUS | Status: DC
  Administered 2021-07-13 – 2021-07-15 (×4): 1 via NASAL
  Filled 2021-07-13: qty 22

## 2021-07-13 MED ORDER — POLYETHYLENE GLYCOL 3350 17 G PO PACK
17.0000 g | PACK | Freq: Every day | ORAL | Status: DC
  Administered 2021-07-13 – 2021-07-15 (×3): 17 g via ORAL
  Filled 2021-07-13 (×3): qty 1

## 2021-07-13 MED ORDER — SODIUM CHLORIDE 0.9 % IV SOLN: INTRAVENOUS | Status: DC | PRN

## 2021-07-13 MED ORDER — ESCITALOPRAM OXALATE 10 MG PO TABS
10.0000 mg | ORAL_TABLET | Freq: Every day | ORAL | Status: DC
  Administered 2021-07-13 – 2021-07-15 (×3): 10 mg via ORAL
  Filled 2021-07-13 (×3): qty 1

## 2021-07-13 MED ORDER — ONDANSETRON HCL 4 MG PO TABS: 4.0000 mg | ORAL_TABLET | Freq: Four times a day (QID) | ORAL | Status: DC | PRN

## 2021-07-13 MED ORDER — BUPIVACAINE-EPINEPHRINE (PF) 0.5% -1:200000 IJ SOLN
INTRAMUSCULAR | Status: DC | PRN
  Administered 2021-07-13: 30 mL via PERINEURAL

## 2021-07-13 MED ORDER — PHENYLEPHRINE HCL-NACL 20-0.9 MG/250ML-% IV SOLN
INTRAVENOUS | Status: AC
  Filled 2021-07-13: qty 250

## 2021-07-13 MED ORDER — ACETAMINOPHEN 10 MG/ML IV SOLN
INTRAVENOUS | Status: DC | PRN
  Administered 2021-07-13: 800 mg via INTRAVENOUS

## 2021-07-13 MED ORDER — SENNA 8.6 MG PO TABS
1.0000 | ORAL_TABLET | Freq: Every day | ORAL | Status: DC
  Administered 2021-07-13 – 2021-07-15 (×3): 8.6 mg via ORAL
  Filled 2021-07-13 (×3): qty 1

## 2021-07-13 MED ORDER — HYDROCODONE-ACETAMINOPHEN 5-325 MG PO TABS
1.0000 | ORAL_TABLET | ORAL | Status: DC | PRN
  Administered 2021-07-14 (×2): 2 via ORAL
  Administered 2021-07-14: 1 via ORAL
  Filled 2021-07-13: qty 1
  Filled 2021-07-13 (×2): qty 2

## 2021-07-13 MED ORDER — ONDANSETRON HCL 4 MG/2ML IJ SOLN: 4.0000 mg | Freq: Four times a day (QID) | INTRAMUSCULAR | Status: DC | PRN

## 2021-07-13 MED ORDER — ACETAMINOPHEN 325 MG PO TABS
325.0000 mg | ORAL_TABLET | Freq: Four times a day (QID) | ORAL | Status: DC | PRN
  Administered 2021-07-14: 650 mg via ORAL
  Filled 2021-07-13: qty 2

## 2021-07-13 MED ORDER — PHENYLEPHRINE HCL-NACL 20-0.9 MG/250ML-% IV SOLN
INTRAVENOUS | Status: DC | PRN
  Administered 2021-07-13: 30 ug/min via INTRAVENOUS

## 2021-07-13 MED ORDER — SODIUM CHLORIDE 0.9 % IV SOLN: INTRAVENOUS | Status: DC

## 2021-07-13 SURGICAL SUPPLY — 52 items
BIT DRILL CROWE POINT TWST 4.3 (DRILL) IMPLANT
BIT DRILL LAG SCREW (DRILL) IMPLANT
BNDG COHESIVE 4X5 TAN ST LF (GAUZE/BANDAGES/DRESSINGS) ×2 IMPLANT
BNDG COHESIVE 6X5 TAN ST LF (GAUZE/BANDAGES/DRESSINGS) ×2 IMPLANT
CHLORAPREP W/TINT 26 (MISCELLANEOUS) ×4 IMPLANT
DRAPE 3/4 80X56 (DRAPES) ×2 IMPLANT
DRAPE C-ARMOR (DRAPES) ×2 IMPLANT
DRILL CROWE POINT TWIST 4.3 (DRILL) ×2
DRILL LAG SCREW (DRILL) ×2
DRSG MEPILEX SACRM 8.7X9.8 (GAUZE/BANDAGES/DRESSINGS) ×2 IMPLANT
DRSG OPSITE POSTOP 3X4 (GAUZE/BANDAGES/DRESSINGS) IMPLANT
DRSG OPSITE POSTOP 4X6 (GAUZE/BANDAGES/DRESSINGS) IMPLANT
ELECT CAUTERY BLADE 6.4 (BLADE) ×2 IMPLANT
ELECT REM PT RETURN 9FT ADLT (ELECTROSURGICAL) ×2
ELECTRODE REM PT RTRN 9FT ADLT (ELECTROSURGICAL) ×1 IMPLANT
GAUZE SPONGE 4X4 12PLY STRL (GAUZE/BANDAGES/DRESSINGS) ×2 IMPLANT
GLOVE BIO SURGEON STRL SZ8 (GLOVE) ×4 IMPLANT
GLOVE SURG UNDER LTX SZ8 (GLOVE) ×2 IMPLANT
GOWN STRL REUS W/ TWL LRG LVL3 (GOWN DISPOSABLE) ×1 IMPLANT
GOWN STRL REUS W/ TWL XL LVL3 (GOWN DISPOSABLE) ×1 IMPLANT
GOWN STRL REUS W/TWL LRG LVL3 (GOWN DISPOSABLE) ×1
GOWN STRL REUS W/TWL XL LVL3 (GOWN DISPOSABLE) ×1
GUIDEPIN VERSANAIL DSP 3.2X444 (ORTHOPEDIC DISPOSABLE SUPPLIES) ×1 IMPLANT
GUIDEWIRE BALL NOSE 80CM (WIRE) ×1 IMPLANT
HFN LAG SCREW 10.5MM X 85MM (Screw) ×1 IMPLANT
HIP FRAC NAIL LEFT 11X360MM (Orthopedic Implant) ×2 IMPLANT
MANIFOLD NEPTUNE II (INSTRUMENTS) ×2 IMPLANT
MAT ABSORB  FLUID 56X50 GRAY (MISCELLANEOUS) ×1
MAT ABSORB FLUID 56X50 GRAY (MISCELLANEOUS) ×1 IMPLANT
NAIL HIP FRAC LEFT 11X360MM (Orthopedic Implant) IMPLANT
NDL FILTER BLUNT 18X1 1/2 (NEEDLE) ×1 IMPLANT
NEEDLE FILTER BLUNT 18X 1/2SAF (NEEDLE) ×1
NEEDLE FILTER BLUNT 18X1 1/2 (NEEDLE) ×1 IMPLANT
NEEDLE HYPO 22GX1.5 SAFETY (NEEDLE) ×2 IMPLANT
NS IRRIG 500ML POUR BTL (IV SOLUTION) ×3 IMPLANT
PACK HIP COMPR (MISCELLANEOUS) ×2 IMPLANT
SCREW BONE CORTICAL 5.0X3 (Screw) ×1 IMPLANT
SCREW BONE CORTICAL 5.0X40 (Screw) ×1 IMPLANT
SCREW LAG HIP NAIL 11X320 (Screw) IMPLANT
SCREWDRIVER HEX TIP 3.5MM (MISCELLANEOUS) ×1 IMPLANT
STAPLER SKIN PROX 35W (STAPLE) ×2 IMPLANT
STRAP SAFETY 5IN WIDE (MISCELLANEOUS) ×2 IMPLANT
SUT VIC AB 0 CT1 36 (SUTURE) ×2 IMPLANT
SUT VIC AB 1 CT1 36 (SUTURE) ×2 IMPLANT
SUT VIC AB 2-0 CT1 (SUTURE) ×4 IMPLANT
SYR 10ML LL (SYRINGE) ×2 IMPLANT
SYR 30ML LL (SYRINGE) ×2 IMPLANT
TAPE MICROFOAM 4IN (TAPE) ×2 IMPLANT
TRAY FOL W/BAG SLVR 16FR STRL (SET/KITS/TRAYS/PACK) IMPLANT
TRAY FOLEY W/BAG SLVR 16FR LF (SET/KITS/TRAYS/PACK) ×1
WATER STERILE IRR 1000ML POUR (IV SOLUTION) ×2 IMPLANT
WATER STERILE IRR 500ML POUR (IV SOLUTION) ×1 IMPLANT

## 2021-07-13 NOTE — Anesthesia Procedure Notes (Signed)
Spinal  Patient location during procedure: OR Reason for block: surgical anesthesia Staffing Performed: resident/CRNA  Anesthesiologist: Corinda Gubler, MD Resident/CRNA: Katherine Basset, CRNA Performed by: Katherine Basset, CRNA Authorized by: Corinda Gubler, MD   Preanesthetic Checklist Completed: patient identified, IV checked, site marked, risks and benefits discussed, surgical consent, monitors and equipment checked, pre-op evaluation and timeout performed Spinal Block Patient position: right lateral decubitus Prep: ChloraPrep and site prepped and draped Patient monitoring: heart rate, continuous pulse ox, blood pressure and cardiac monitor Approach: midline Location: L3-4 Injection technique: single-shot Needle Needle type: Whitacre and Introducer  Needle gauge: 24 G Needle length: 9 cm Assessment Sensory level: T10 Events: CSF return Additional Notes Meticulous sterile technique used throughout (CHG prep, sterile gloves, sterile drape). Negative paresthesia. Negative blood return. Positive free-flowing CSF. Expiration date of kit checked and confirmed. Patient tolerated procedure well, without complications.

## 2021-07-13 NOTE — ED Notes (Signed)
Admitting MD at beside. 

## 2021-07-13 NOTE — Consult Note (Signed)
ORTHOPAEDIC CONSULTATION  REQUESTING PHYSICIAN: Wouk, Wilfred Curtis, MD  Chief Complaint:   Left thigh pain.  History of Present Illness: Lisa Mills is a 88 y.o. female with a history of multiple medical problems including diabetes, gastroesophageal reflux disease, hypertension, hypothyroidism, hyperlipidemia, chronic kidney disease, restless leg syndrome, and anxiety/depression who normally lives independently.  The patient was in her usual state of health yesterday evening when she lost her balance and fell while trying to close her door after coming in from the porch with her dog.  She was brought to the emergency room where x-rays demonstrated a displaced oblique fracture of the left femur.  The patient denies any associated injuries.  She did not strike her head or lose consciousness.  The patient also denies any lightheadedness, dizziness, chest pain, shortness of breath, or other symptoms which may have precipitated her fall.  Past Medical History:  Diagnosis Date   Anemia    Anxiety    Chronic kidney disease    stage 3-4   Constipation    Depression    Dermatophytosis of nail    Diabetes mellitus without complication (HCC)    Diet controlled, lost weight, no meds   GERD (gastroesophageal reflux disease)    Hemorrhoids    History of shingles    HLD (hyperlipidemia)    Hypertension    Hypothyroidism    Keratoderma    Restless legs syndrome (RLS)    hx   Past Surgical History:  Procedure Laterality Date   BREAST BIOPSY Right 09/30/2017   Affirm bx-calcs ( X clip),benign   BREAST EXCISIONAL BIOPSY Right    neg   cataracts     COLONOSCOPY W/ POLYPECTOMY     COLONOSCOPY WITH PROPOFOL N/A 02/10/2017   Procedure: COLONOSCOPY WITH PROPOFOL;  Surgeon: Scot Jun, MD;  Location: [facility] ENDOSCOPY;  Service: Endoscopy;  Laterality: N/A;   ESOPHAGOGASTRODUODENOSCOPY (EGD) WITH PROPOFOL N/A 02/10/2017    Procedure: ESOPHAGOGASTRODUODENOSCOPY (EGD) WITH PROPOFOL;  Surgeon: Scot Jun, MD;  Location: [facility] ENDOSCOPY;  Service: Endoscopy;  Laterality: N/A;   EYE SURGERY     bilateral cataracts   HAND SURGERY     KIDNEY STONE SURGERY     ORIF HUMERUS FRACTURE Right 02/13/2020   Procedure: OPEN REDUCTION INTERNAL FIXATION (ORIF) SUPRACONDYLAR  HUMERUS FRACTURE;  Surgeon: Myrene Galas, MD;  Location: [facility] OR;  Service: Orthopedics;  Laterality: Right;   SACROPLASTY N/A 07/06/2019   Procedure: SACROPLASTY;  Surgeon: Kennedy Bucker, MD;  Location: [facility] ORS;  Service: Orthopedics;  Laterality: N/A;   TONSILECTOMY, ADENOIDECTOMY, BILATERAL MYRINGOTOMY AND TUBES     TONSILLECTOMY     TRIGGER FINGER RELEASE     Left ring finger   tubercular peritonitis     Social History   Socioeconomic History   Marital status: Widowed    Spouse name: Not on file   Number of children: 2   Years of education: Not on file   Highest education level: Some college, no degree  Occupational History   Not on file  Tobacco Use   Smoking status: Never   Smokeless tobacco: Never  Vaping Use   Vaping Use: Never used  Substance and Sexual Activity   Alcohol use: Not Currently   Drug use: No   Sexual activity: Not on file  Other Topics Concern   Not on file  Social History Narrative   Not on file   Social Determinants of Health   Financial Resource Strain: Low Risk  (12/06/2020)   Overall  Financial Resource Strain (CARDIA)    Difficulty of Paying Living Expenses: Not hard at all  Food Insecurity: No Food Insecurity (12/06/2020)   Hunger Vital Sign    Worried About Running Out of Food in the Last Year: Never true    Ran Out of Food in the Last Year: Never true  Transportation Needs: No Transportation Needs (12/06/2020)   PRAPARE - Administrator, Civil Service (Medical): No    Lack of Transportation (Non-Medical): No  Physical Activity: Insufficiently Active (12/06/2020)   Exercise Vital Sign     Days of Exercise per Week: 7 days    Minutes of Exercise per Session: 10 min  Stress: No Stress Concern Present (12/06/2020)   [employer] of Occupational Health - Occupational Stress Questionnaire    Feeling of Stress : Not at all  Social Connections: Moderately Isolated (12/06/2020)   Social Connection and Isolation Panel [NHANES]    Frequency of Communication with Friends and Family: More than three times a week    Frequency of Social Gatherings with Friends and Family: Once a week    Attends Religious Services: 1 to 4 times per year    Active Member of [employer] or Organizations: No    Attends Banker Meetings: Never    Marital Status: Widowed   Family History  Adopted: Yes  Problem Relation Age of Onset   Breast cancer Neg Hx    Allergies  Allergen Reactions   Aspirin Other (See Comments)    Burns stomach   Conray [Iothalamate] Hives    IV dye conray-400   Dye Fdc Red [Red Dye] Hives   Sulfa Antibiotics     Unknown Reaction, not used in years   Sulfasalazine     Other reaction(s): Unknown   Prednisone     Indigestion    Prior to Admission medications   Medication Sig Start Date End Date Taking? Authorizing Provider  acetaminophen (TYLENOL) 500 MG tablet Take 1 tablet (500 mg total) by mouth every 12 (twelve) hours. Patient not taking: Reported on 12/06/2020 02/13/20   Montez Morita, PA-C  Blood Glucose Monitoring Suppl (ONE TOUCH ULTRA 2) w/Device KIT Use to check blood sugar as advised up to 2 times daily 03/14/21   [name], DO  Cholecalciferol (VITAMIN D) 50 MCG (2000 UT) CAPS Take 2,000 Units by mouth daily.     [provider]  cyanocobalamin (,VITAMIN B-12,) 1000 MCG/ML injection Inject 1 mL (1,000 mcg total) into the muscle every 30 (thirty) days. For 3 months, then need repeat B12 lab level 09/06/20   Karamalegos, Alexander J, DO  escitalopram (LEXAPRO) 10 MG tablet Take 1 tablet (10 mg total) by mouth daily. with food 04/22/21    [name], DO  ferrous sulfate 325 (65 FE) MG tablet Take 325 mg by mouth daily.     [provider]  glucose blood (ONE TOUCH ULTRA TEST) test strip CHECK BLOOD SUGAR UP TO 2 TIMES A DAY. 03/14/21   Karamalegos, [name], DO  glycerin adult 2 g suppository Place 1 suppository rectally as needed for constipation.    [provider]  Histamine Dihydrochloride (AUSTRALIAN DREAM ARTHRITIS) 0.025 % CREA Apply 1 application topically daily as needed (Pain).    [provider]  hydrocortisone (ANUSOL-HC) 2.5 % rectal cream USE 1 APPLICATION RECTALLY TWICE DAILY AS DIRECTED Patient taking differently: Place 1 application rectally daily as needed for hemorrhoids or anal itching. 09/11/19   [name], DO  levothyroxine (SYNTHROID) 25 MCG tablet Take 1 tablet (25 mcg total) by mouth daily before breakfast. 11/11/20   Karamalegos, [name], DO  lisinopril (ZESTRIL) 5 MG tablet TAKE 1 TABLET BY MOUTH ONCE DAILY 05/21/21   Althea Charon, [name], DO  nitrofurantoin (MACRODANTIN) 100 MG capsule Take 1 capsule (100 mg total) by mouth daily. 11/25/20   Alfredo Martinez, MD  omeprazole (PRILOSEC) 20 MG capsule TAKE 1 CAPSULE BY MOUTH ONCE DAILY 01/23/21   Althea Charon, [name], DO  OneTouch Delica Lancets 33G MISC Use to check blood sugar up to 2 times daily 03/14/21   Althea Charon, [name], DO  polyethylene glycol (MIRALAX / GLYCOLAX) 17 g packet Take 17 g by mouth daily as needed for moderate constipation or severe constipation. Patient taking differently: Take 17 g by mouth daily. 07/24/19   Swayze, Ava, DO  simvastatin (ZOCOR) 40 MG tablet TAKE 1 TABLET BY MOUTH AT BEDTIME 03/19/21   Karamalegos, [name], DO  traZODone (DESYREL) 50 MG tablet Take 0.5-1 tablets (25-50 mg total) by mouth at bedtime as needed for sleep. 10/05/19   Malfi, Jodelle Gross, FNP  witch hazel-glycerin (TUCKS) pad Apply 1 application topically as needed for itching.    [provider]   DG Chest 1 View  Result Date: 07/12/2021 CLINICAL DATA:  Fall, left femur fracture EXAM: CHEST  1 VIEW COMPARISON:  02/13/2020 FINDINGS: Large hiatal hernia. Heart is normal size. No confluent airspace opacities or effusions. No acute bony abnormality. IMPRESSION: No active cardiopulmonary disease. Large hiatal hernia. Electronically Signed   By: Charlett Nose M.D.   On: 07/12/2021 22:33   DG Hip Unilat W or Wo Pelvis 2-3 Views Left  Result Date: 07/12/2021 CLINICAL DATA:  Fall EXAM: DG HIP (WITH OR WITHOUT PELVIS) 2-3V LEFT COMPARISON:  Femur series today FINDINGS: Oblique distal femoral shaft fracture again noted as seen on femur series. No additional femoral fracture. Specifically, no femoral neck fracture. No subluxation or dislocation. Degenerative changes in the hips bilaterally with joint space narrowing and spurring. Chronic appearing fractures in the left superior and inferior pubic rami. IMPRESSION: Oblique distal femoral shaft fracture. No acute bony abnormality in the femoral neck region. Old left superior and inferior pubic rami fractures. Electronically Signed   By: Charlett Nose M.D.   On: 07/12/2021 22:32   DG Femur Portable Min 2 Views Left  Result Date: 07/12/2021 CLINICAL DATA:  Fall EXAM: LEFT FEMUR PORTABLE 2 VIEWS COMPARISON:  None Available. FINDINGS: There is an oblique fracture through the distal shaft of the left femur. Distal fragment is displaced 1 shaft width medially with overlap. No extension into the knee joint. No joint effusion. IMPRESSION: Oblique displaced distal left femoral shaft fracture. Electronically Signed   By: Charlett Nose M.D.   On: 07/12/2021 22:31    Positive ROS: All other systems have been reviewed and were otherwise negative with the exception of those mentioned in the HPI and as above.  Physical Exam: General:  Alert, no acute distress Psychiatric:  Patient is competent for consent with normal mood and affect   Cardiovascular:  No  pedal edema Respiratory:  No wheezing, non-labored breathing GI:  Abdomen is soft and non-tender Skin:  No lesions in the area of chief complaint Neurologic:  Sensation intact distally Lymphatic:  No axillary or cervical lymphadenopathy  Orthopedic Exam:  Orthopedic examination is limited to the left hip and lower extremity.  There is moderate swelling around the left mid thigh region, but no erythema, ecchymosis,  abrasions, or other skin abnormalities are identified.  There is moderate tenderness to palpation over the mid thigh region.  She has more severe pain with any attempted active or passive motion of the hip or knee.  She is neurovascularly intact to the left foot, demonstrating the ability to dorsiflex and plantarflex her toes and ankle.  Sensation is intact to light touch to all distributions.  She has good capillary refill to her left foot.  X-rays:  X-rays of the pelvis and left hip, as well as of the left femur are available for review and have been reviewed by myself.  These films demonstrate a displaced oblique midshaft fracture of the left femur.  No lytic lesions or other acute bony abnormalities are identified.  Assessment: Closed displaced left midshaft femur fracture.  Plan: The treatment options, including both surgical and nonsurgical choices, have been discussed in detail with the patient.  The risks (including bleeding, infection, nerve and/or blood vessel injury, persistent or recurrent pain, loosening or failure of the components, leg length inequality, dislocation, need for further surgery, blood clots, strokes, heart attacks or arrhythmias, pneumonia, etc.) and benefits of the surgical procedure were discussed.  The patient states her understanding and agrees to proceed. She agrees to a blood transfusion if necessary.  A formal written consent will be obtained by the nursing staff.  Thank you for asking me to participate in the care of this most pleasant and unfortunate  woman.  I will be happy to follow her with you.   Maryagnes Amos, MD  Beeper #:  [phone]  07/13/2021 10:15 AM

## 2021-07-13 NOTE — Progress Notes (Addendum)
PROGRESS NOTE    Lisa Mills  KKX:[id number] DOB: August 03, 1929 DOA: 07/12/2021 PCP: [name], DO      Brief Narrative:   Presenting with left femur fracture after trip and fall at home.    Assessment & Plan:   Principal Problem:   Femur fracture (HCC) Active Problems:   Type 2 diabetes with stage 3 chronic kidney disease GFR 30-59 (HCC)   Hypothyroidism   GERD (gastroesophageal reflux disease)   Hyperlipidemia associated with type 2 diabetes mellitus (HCC)   CKD stage 3 due to type 2 diabetes mellitus (HCC)   Anemia in chronic kidney disease (CKD)   Osteopenia   Essential hypertension   Femur fracture, left (HCC)  # Femoral shaft fracture Neurovascularly intact. Ortho consulted, plan for operative repair today. No hx CAD or recent symptoms of such. - surgery today - pain control, bowel regimen  # T2DM Here mild glucose elevations. Not on meds at home. Recent a1c in the 7s - daily fasting glucose  # Hyponatremia Chronic problem, baseline sodium appears to be upper 120s which is where it is now. Followed by nephro for this. Likely siadh, possible reset osmostat. In terms of med causes, home ppi and ssri could contribute - monitor for now - urine studies pending  # GAD - home escitalopram  # Hypothyroid - home synthroid  # HTN Initial BPs severely elevated. Given morphine and IV hydralazine and BPs now low normal - hold home lisinopril - cont home statin  # Recurrent UTI On macrobid daily for ppx; pharmacy advises against given crcl less than 30, so will hold  # Insomnia - Home trazodone qhs prn   DVT prophylaxis: SCDs Code Status: full Family Communication: son updated telephonically 6/25  Level of care: Telemetry Medical Status is: Inpatient Remains inpatient appropriate because: need for inpatient surgery    Consultants:  ortho  Procedures: ORIF pending  Antimicrobials:  Pre-op abx    Subjective: Moderate left leg  pain  Objective: Vitals:   07/13/21 0033 07/13/21 0109 07/13/21 0550 07/13/21 0749  BP: (!) 190/92 (!) 105/45 (!) 108/52 (!) 106/57  Pulse: 94 83 83 83  Resp: 14 16 16 16   Temp: 98.2 F (36.8 C) 99.9 F (37.7 C) 98.1 F (36.7 C) 98.9 F (37.2 C)  TempSrc:  Oral    SpO2: 96% 98% 96% 98%  Weight:  49.9 kg    Height:  4\' 11"  (1.499 m)     No intake or output data in the 24 hours ending 07/13/21 0806 Filed Weights   07/12/21 2044 07/13/21 0109  Weight: 49.9 kg 49.9 kg    Examination:  General exam: Appears calm and comfortable  Respiratory system: Clear to auscultation. Respiratory effort normal. Cardiovascular system: S1 & S2 heard, RRR. No JVD, murmurs, rubs, gallops or clicks. No pedal edema. Gastrointestinal system: Abdomen is nondistended, soft and nontender. No organomegaly or masses felt. Normal bowel sounds heard. Central nervous system: Alert and oriented. No focal neurological deficits. Extremities: left leg in traction, warm Skin: No rashes, lesions or ulcers Psychiatry: Judgement and insight appear normal. Mood & affect appropriate.     Data Reviewed: I have personally reviewed following labs and imaging studies  CBC: Recent Labs  Lab 07/12/21 2059  WBC 8.1  NEUTROABS 3.8  HGB 9.9*  HCT 29.1*  MCV 90.7  PLT 283   Basic Metabolic Panel: Recent Labs  Lab 07/12/21 2059  NA 127*  K 4.2  CL 93*  CO2 23  GLUCOSE 145*  BUN 18  CREATININE 0.98  CALCIUM 9.5   GFR: Estimated Creatinine Clearance: 26.5 mL/min (by C-G formula based on SCr of 0.98 mg/dL). Liver Function Tests: No results for input(s): "AST", "ALT", "ALKPHOS", "BILITOT", "PROT", "ALBUMIN" in the last 168 hours. No results for input(s): "LIPASE", "AMYLASE" in the last 168 hours. No results for input(s): "AMMONIA" in the last 168 hours. Coagulation Profile: No results for input(s): "INR", "PROTIME" in the last 168 hours. Cardiac Enzymes: No results for input(s): "CKTOTAL", "CKMB",  "CKMBINDEX", "TROPONINI" in the last 168 hours. BNP (last 3 results) No results for input(s): "PROBNP" in the last 8760 hours. HbA1C: No results for input(s): "HGBA1C" in the last 72 hours. CBG: Recent Labs  Lab 07/13/21 0202 07/13/21 0607  GLUCAP 228* 178*   Lipid Profile: No results for input(s): "CHOL", "HDL", "LDLCALC", "TRIG", "CHOLHDL", "LDLDIRECT" in the last 72 hours. Thyroid Function Tests: No results for input(s): "TSH", "T4TOTAL", "FREET4", "T3FREE", "THYROIDAB" in the last 72 hours. Anemia Panel: No results for input(s): "VITAMINB12", "FOLATE", "FERRITIN", "TIBC", "IRON", "RETICCTPCT" in the last 72 hours. Urine analysis:    Component Value Date/Time   COLORURINE YELLOW 02/13/2020 0622   APPEARANCEUR CLEAR 02/13/2020 0622   APPEARANCEUR Hazy (A) 10/02/2019 1509   LABSPEC 1.008 02/13/2020 0622   PHURINE 6.0 02/13/2020 0622   GLUCOSEU NEGATIVE 02/13/2020 0622   HGBUR NEGATIVE 02/13/2020 0622   BILIRUBINUR NEGATIVE 02/13/2020 0622   BILIRUBINUR Negative 10/02/2019 1509   KETONESUR NEGATIVE 02/13/2020 0622   PROTEINUR NEGATIVE 02/13/2020 0622   UROBILINOGEN Normal 03/29/2017 0000   NITRITE NEGATIVE 02/13/2020 0622   LEUKOCYTESUR NEGATIVE 02/13/2020 0622   Sepsis Labs: @LABRCNTIP (procalcitonin:4,lacticidven:4)  ) Recent Results (from the past 240 hour(s))  Surgical PCR screen     Status: None   Collection Time: 07/13/21  1:20 AM   Specimen: Nasal Mucosa; Nasal Swab  Result Value Ref Range Status   MRSA, PCR NEGATIVE NEGATIVE Final   Staphylococcus aureus NEGATIVE NEGATIVE Final    Comment: (NOTE) The Xpert SA Assay (FDA approved for NASAL specimens in patients 58 years of age and older), is one component of a comprehensive surveillance program. It is not intended to diagnose infection nor to guide or monitor treatment. Performed at [facility], [address], [place], [place] [address]          Radiology Studies: DG Chest 1  View  Result Date: 07/12/2021 CLINICAL DATA:  Fall, left femur fracture EXAM: CHEST  1 VIEW COMPARISON:  02/13/2020 FINDINGS: Large hiatal hernia. Heart is normal size. No confluent airspace opacities or effusions. No acute bony abnormality. IMPRESSION: No active cardiopulmonary disease. Large hiatal hernia. Electronically Signed   By: Charlett Nose M.D.   On: 07/12/2021 22:33   DG Hip Unilat W or Wo Pelvis 2-3 Views Left  Result Date: 07/12/2021 CLINICAL DATA:  Fall EXAM: DG HIP (WITH OR WITHOUT PELVIS) 2-3V LEFT COMPARISON:  Femur series today FINDINGS: Oblique distal femoral shaft fracture again noted as seen on femur series. No additional femoral fracture. Specifically, no femoral neck fracture. No subluxation or dislocation. Degenerative changes in the hips bilaterally with joint space narrowing and spurring. Chronic appearing fractures in the left superior and inferior pubic rami. IMPRESSION: Oblique distal femoral shaft fracture. No acute bony abnormality in the femoral neck region. Old left superior and inferior pubic rami fractures. Electronically Signed   By: Charlett Nose M.D.   On: 07/12/2021 22:32   DG Femur Portable Min 2 Views Left  Result  Date: 07/12/2021 CLINICAL DATA:  Fall EXAM: LEFT FEMUR PORTABLE 2 VIEWS COMPARISON:  None Available. FINDINGS: There is an oblique fracture through the distal shaft of the left femur. Distal fragment is displaced 1 shaft width medially with overlap. No extension into the knee joint. No joint effusion. IMPRESSION: Oblique displaced distal left femoral shaft fracture. Electronically Signed   By: Charlett Nose M.D.   On: 07/12/2021 22:31        Scheduled Meds:  insulin aspart  0-6 Units Subcutaneous Q4H   mupirocin ointment  1 Application Nasal BID   Continuous Infusions:   ceFAZolin (ANCEF) IV       LOS: 1 day     Silvano Bilis, MD [facility]   If 7PM-7AM, please contact night-coverage [email or web address] Password [facility] 07/13/2021, 8:06  AM

## 2021-07-13 NOTE — Anesthesia Preprocedure Evaluation (Signed)
Anesthesia Evaluation  Patient identified by MRN, date of birth, ID band Patient awake  General Assessment Comment:Patient had severe sore throat and hoarseness that bothered her greatly after a previous general anesthetic.  Reviewed: Allergy & Precautions, NPO status , Patient's Chart, lab work & pertinent test results  History of Anesthesia Complications Negative for: history of anesthetic complications  Airway Mallampati: II  TM Distance: >3 FB Neck ROM: Full    Dental  (+) Lower Dentures, Upper Dentures   Pulmonary neg pulmonary ROS, neg sleep apnea, neg COPD, Patient abstained from smoking.Not current smoker,    Pulmonary exam normal breath sounds clear to auscultation       Cardiovascular Exercise Tolerance: Good METShypertension, Pt. on medications (-) CAD and (-) Past MI negative cardio ROS  (-) dysrhythmias  Rhythm:Regular Rate:Normal - Systolic murmurs    Neuro/Psych  Headaches, PSYCHIATRIC DISORDERS Anxiety Depression    GI/Hepatic hiatal hernia, GERD  Controlled and Medicated,(+)     (-) substance abuse  ,   Endo/Other  diabetesHypothyroidism   Renal/GU CRFRenal disease     Musculoskeletal  (+) Arthritis ,   Abdominal   Peds  Hematology   Anesthesia Other Findings Past Medical History: No date: Anemia No date: Anxiety No date: Chronic kidney disease     Comment:  stage 3-4 No date: Constipation No date: Depression No date: Dermatophytosis of nail No date: Diabetes mellitus without complication (HCC)     Comment:  Diet controlled, lost weight, no meds No date: GERD (gastroesophageal reflux disease) No date: Hemorrhoids No date: History of shingles No date: HLD (hyperlipidemia) No date: Hypertension No date: Hypothyroidism No date: Keratoderma No date: Restless legs syndrome (RLS)     Comment:  hx  Reproductive/Obstetrics                             Anesthesia  Physical Anesthesia Plan  ASA: 3  Anesthesia Plan: Spinal   Post-op Pain Management:    Induction: Intravenous  PONV Risk Score and Plan: 2 and Ondansetron, Dexamethasone, Propofol infusion, TIVA and Treatment may vary due to age or medical condition  Airway Management Planned: Natural Airway  Additional Equipment: None  Intra-op Plan:   Post-operative Plan:   Informed Consent: I have reviewed the patients History and Physical, chart, labs and discussed the procedure including the risks, benefits and alternatives for the proposed anesthesia with the patient or authorized representative who has indicated his/her understanding and acceptance.       Plan Discussed with: CRNA and Surgeon  Anesthesia Plan Comments: (Discussed R/B/A of neuraxial anesthesia technique with patient: - rare risks of spinal/epidural hematoma, nerve damage, infection - Risk of PDPH - Risk of nausea and vomiting - Risk of conversion to general anesthesia and its associated risks, including sore throat, damage to lips/eyes/teeth/oropharynx, and rare risks such as cardiac and respiratory events. - Risk of allergic reactions - post operative cognitive dysfunction  Discussed the role of CRNA in patient's perioperative care.  Patient voiced understanding.)        Anesthesia Quick Evaluation

## 2021-07-13 NOTE — Progress Notes (Signed)
Consent signed, son Elijah Birk at bedside

## 2021-07-13 NOTE — Transfer of Care (Signed)
Immediate Anesthesia Transfer of Care Note  Patient: Lisa Mills  Procedure(s) Performed: INTRAMEDULLARY (IM) NAIL INTERTROCHANTRIC (Left: Hip)  Patient Location: PACU  Anesthesia Type:General and Spinal  Level of Consciousness: awake, alert  and oriented  Airway & Oxygen Therapy: Patient Spontanous Breathing and Patient connected to face mask oxygen  Post-op Assessment: Report given to RN and Post -op Vital signs reviewed and stable  Post vital signs: Reviewed and stable  Last Vitals:  Vitals Value Taken Time  BP 117/74 07/13/21 1358  Temp    Pulse 72 07/13/21 1402  Resp 11 07/13/21 1402  SpO2 99 % 07/13/21 1402  Vitals shown include unvalidated device data.  Last Pain:  Vitals:   07/13/21 0800  TempSrc:   PainSc: 0-No pain         Complications: No notable events documented.

## 2021-07-14 ENCOUNTER — Encounter: Payer: Self-pay | Admitting: Surgery

## 2021-07-14 DIAGNOSIS — S7292XA Unspecified fracture of left femur, initial encounter for closed fracture: Secondary | ICD-10-CM | POA: Diagnosis not present

## 2021-07-14 LAB — GLUCOSE, CAPILLARY: Glucose-Capillary: 168 mg/dL — ABNORMAL HIGH (ref 70–99)

## 2021-07-14 LAB — BASIC METABOLIC PANEL
Anion gap: 5 (ref 5–15)
BUN: 21 mg/dL (ref 8–23)
CO2: 25 mmol/L (ref 22–32)
Calcium: 8.3 mg/dL — ABNORMAL LOW (ref 8.9–10.3)
Chloride: 96 mmol/L — ABNORMAL LOW (ref 98–111)
Creatinine, Ser: 0.96 mg/dL (ref 0.44–1.00)
GFR, Estimated: 57 mL/min — ABNORMAL LOW (ref >60)
Glucose, Bld: 164 mg/dL — ABNORMAL HIGH (ref 70–99)
Potassium: 4.3 mmol/L (ref 3.5–5.1)
Sodium: 126 mmol/L — ABNORMAL LOW (ref 135–145)

## 2021-07-14 LAB — CBC
HCT: 21.4 % — ABNORMAL LOW (ref 36.0–46.0)
Hemoglobin: 7.2 g/dL — ABNORMAL LOW (ref 12.0–15.0)
MCH: 30.5 pg (ref 26.0–34.0)
MCHC: 33.6 g/dL (ref 30.0–36.0)
MCV: 90.7 fL (ref 80.0–100.0)
Platelets: 209 10*3/uL (ref 150–400)
RBC: 2.36 MIL/uL — ABNORMAL LOW (ref 3.87–5.11)
RDW: 12.7 % (ref 11.5–15.5)
WBC: 9.8 10*3/uL (ref 4.0–10.5)
nRBC: 0 % (ref 0.0–0.2)

## 2021-07-14 LAB — HEMOGLOBIN AND HEMATOCRIT, BLOOD
HCT: 26.3 % — ABNORMAL LOW (ref 36.0–46.0)
Hemoglobin: 9.1 g/dL — ABNORMAL LOW (ref 12.0–15.0)

## 2021-07-14 LAB — PREPARE RBC (CROSSMATCH)

## 2021-07-14 MED ORDER — LISINOPRIL 5 MG PO TABS
5.0000 mg | ORAL_TABLET | Freq: Every day | ORAL | Status: DC
  Administered 2021-07-14 – 2021-07-15 (×2): 5 mg via ORAL
  Filled 2021-07-14 (×2): qty 1

## 2021-07-14 MED ORDER — SODIUM CHLORIDE 0.9% IV SOLUTION: Freq: Once | INTRAVENOUS | Status: AC

## 2021-07-14 MED ORDER — SODIUM CHLORIDE 1 G PO TABS
1.0000 g | ORAL_TABLET | Freq: Two times a day (BID) | ORAL | Status: DC
  Administered 2021-07-14 – 2021-07-15 (×2): 1 g via ORAL
  Filled 2021-07-14 (×3): qty 1

## 2021-07-14 NOTE — Progress Notes (Signed)
Initial Nutrition Assessment  DOCUMENTATION CODES:   Non-severe (moderate) malnutrition in context of chronic illness  INTERVENTION:  Encourage adequate PO intake Ensure Enlive po BID, each supplement provides 350 kcal and 20 grams of protein.  NUTRITION DIAGNOSIS:   Moderate Malnutrition related to chronic illness as evidenced by mild fat depletion, moderate muscle depletion, severe muscle depletion.  GOAL:   Patient will meet greater than or equal to 90% of their needs  MONITOR:   PO intake, Supplement acceptance, Labs, Weight trends  REASON FOR ASSESSMENT:   Consult Assessment of nutrition requirement/status, Hip fracture protocol  ASSESSMENT:   Pt admitted from home after a fall leading to a L hip fracture. PMH significant for HTN, CKD, anemia, DM, hypothyroidism and HLD.   06/26: s/p IMN intertrochantric L hip  Noted plans for rehab after admission.   Pt sitting up in chair during visit and reports this is the first time she has been up since her fall. She states that she has not been eating too well within the past few couple days since her fall and being unable to eat given NPO status for surgery over the weekend. At home she recalls eating 3 meals per day and does not usually eat snacks between meals as they are "too salty" but occasionally may have Cheetos. She recalls eating a few frozen pancakes or waffles for breakfast, a sandwich or salad for lunch, and a Lean Cuisine meal for dinner. Pt lives alone but occasionally she has family or neighbors who will bring meals for her and take her shopping.   Meal completions: 06/26: 50%-lunch  Pt denies difficulty swallowing in general but is pleased with her current dysphagia diet for ease of chewing. She reports chronic constipation, for which she uses Miralax, which is likely attributed to use of iron supplementation for anemia.   Pt reports a usual wt around 110 lbs and denies recent wt loss. Per review of wt history,  it appears as though her wt as remained stable. Current wt noted to be 110 lbs.   Pt also shares that her husband passed away 2 years ago and after this she fractured her coccyx.   Medications: colace, synthroid, protonix, miralax, senna  Labs: sodium  126, Ca 8.3, GFR 57, CBG's 138-228 x24 hours  NUTRITION - FOCUSED PHYSICAL EXAM: Pt reports h/o R broken arm with limited use of R arm.   Flowsheet Row Most Recent Value  Orbital Region Mild depletion  Upper Arm Region Moderate depletion  [R>L]  Thoracic and Lumbar Region Mild depletion  Buccal Region Mild depletion  Temple Region Mild depletion  Clavicle Bone Region Moderate depletion  Clavicle and Acromion Bone Region Mild depletion  Scapular Bone Region Moderate depletion  Dorsal Hand Moderate depletion  Patellar Region Severe depletion  Anterior Thigh Region Severe depletion  Posterior Calf Region Moderate depletion  Edema (RD Assessment) None  Hair Reviewed  Eyes Reviewed  Mouth Reviewed  Skin Reviewed  Nails Reviewed      Diet Order:   Diet Order             DIET DYS 3 Room service appropriate? Yes; Fluid consistency: Thin  Diet effective now                   EDUCATION NEEDS:   Education needs have been addressed  Skin:  Skin Assessment: Skin Integrity Issues: Skin Integrity Issues:: Incisions Incisions: L hip, knee, thigh (closed)  Last BM:  6/24  Height:  Ht Readings from Last 1 Encounters:  07/13/21 4\' 11"  (1.499 m)    Weight:   Wt Readings from Last 1 Encounters:  07/13/21 49.9 kg   BMI:  Body mass index is 22.22 kg/m.  Estimated Nutritional Needs:   Kcal:  1300-1500  Protein:  65-80g  Fluid:  >/=1.5L  Drusilla Kanner, RDN, LDN Clinical Nutrition

## 2021-07-14 NOTE — TOC Initial Note (Signed)
Transition of Care [facility]) - Initial/Assessment Note    Patient Details  Name: Lisa Mills MRN: [id number] Date of Birth: 1929/10/23  [facility]) CM/SW Contact:    Maree Krabbe, LCSW Phone Number: 07/14/2021, 1:35 PM  Clinical Narrative:    CSW spoke with pt and pt is agreeable to SNF however does not want to go to [facility]. At this time pt would like to go to [facility]. Referral sent.               Expected Discharge Plan: Skilled Nursing Facility Barriers to Discharge: Continued Medical Work up   Patient Goals and CMS Choice        Expected Discharge Plan and Services Expected Discharge Plan: Skilled Nursing Facility In-house Referral: Clinical Social Work   Post Acute Care Choice: Skilled Nursing Facility Living arrangements for the past 2 months: Single Family Home                                      Prior Living Arrangements/Services Living arrangements for the past 2 months: Single Family Home Lives with:: Self Patient language and need for interpreter reviewed:: Yes Do you feel safe going back to the place where you live?: Yes      Need for Family Participation in Patient Care: Yes (Comment) Care giver support system in place?: Yes (comment)   Criminal Activity/Legal Involvement Pertinent to Current Situation/Hospitalization: No - Comment as needed  Activities of Daily Living Home Assistive Devices/Equipment: Dan Humphreys (specify type) ADL Screening (condition at time of admission) Patient's cognitive ability adequate to safely complete daily activities?: Yes Is the patient deaf or have difficulty hearing?: No Does the patient have difficulty seeing, even when wearing glasses/contacts?: No Does the patient have difficulty concentrating, remembering, or making decisions?: No Patient able to express need for assistance with ADLs?: Yes Does the patient have difficulty dressing or bathing?: No Independently performs ADLs?:  No Communication: Needs assistance Is this a change from baseline?: Change from baseline, expected to last <3 days Dressing (OT): Independent with device (comment) Grooming: Independent with device (comment) Feeding: Independent Bathing: Independent with device (comment) Toileting: Independent with device (comment) In/Out Bed: Independent Walks in Home: Independent Does the patient have difficulty walking or climbing stairs?: No Weakness of Legs: None Weakness of Arms/Hands: Right  Permission Sought/Granted Permission sought to share information with : Family Supports Permission granted to share information with : Yes, Verbal Permission Granted  Share Information with NAME: tom     Permission granted to share info w Relationship: son     Emotional Assessment Appearance:: Appears stated age Attitude/Demeanor/Rapport: Engaged Affect (typically observed): Accepting Orientation: : Oriented to Self, Oriented to Place, Oriented to  Time, Oriented to Situation Alcohol / Substance Use: Not Applicable Psych Involvement: No (comment)  Admission diagnosis:  Femur fracture (HCC) [S72.90XA] Femur fracture, left (HCC) [S72.92XA] Patient Active Problem List   Diagnosis Date Noted   Essential hypertension 07/13/2021   Femur fracture, left (HCC) 07/13/2021   Chronic hyponatremia 07/13/2021   Femur fracture (HCC) 07/12/2021   Right wrist drop 03/25/2020   Psychophysiological insomnia 10/06/2019   Nonintractable headache 10/06/2019   Urinary incontinence 10/06/2019   Internal and external bleeding hemorrhoids 08/15/2019   Sacral pain    Radicular pain of sacrum 07/11/2019   Sacral fracture, closed (HCC) 07/04/2019   Fall 07/04/2019   Bilateral pubic rami fractures, sequela 07/04/2019  Background diabetic retinopathy (HCC) 01/23/2019   Schatzki's ring 03/04/2017   Post herpetic neuralgia 10/06/2016   Hx of colonic polyps 09/22/2016   Osteopenia 09/02/2016   Anxiety associated with  depression 08/25/2016   Trapezius muscle spasm 05/25/2016   Intermittent left lower quadrant abdominal pain 03/16/2016   Anemia in chronic kidney disease (CKD) 02/26/2016   Type 2 diabetes with stage 3 chronic kidney disease GFR 30-59 (HCC) 02/25/2016   Hypothyroidism 02/25/2016   Constipation 02/25/2016   GERD (gastroesophageal reflux disease) 02/25/2016   Hyperlipidemia associated with type 2 diabetes mellitus (HCC) 02/25/2016   DJD (degenerative joint disease) of cervical spine 02/25/2016   Osteoarthritis of multiple joints 02/25/2016   Hiatal hernia 02/25/2016   CKD stage 3 due to type 2 diabetes mellitus (HCC) 02/25/2016   Chronic kidney disease, unspecified 09/05/2013   Benign hypertension with CKD (chronic kidney disease) stage III (HCC) 09/05/2013   CMC arthritis, thumb, degenerative 06/15/2013   Onychomycosis due to dermatophyte 05/25/2013   Acquired keratoderma 05/25/2013   PCP:  [name], DO Pharmacy:   [facility] [place], [place] - [address] [place] [address] Phone: [phone] Fax: [phone]     Social Determinants of Health (SDOH) Interventions    Readmission Risk Interventions     No data to display

## 2021-07-14 NOTE — Anesthesia Postprocedure Evaluation (Signed)
Anesthesia Post Note  Patient: Lisa Mills  Procedure(s) Performed: INTRAMEDULLARY (IM) NAIL INTERTROCHANTRIC (Left: Hip)  Patient location during evaluation: Nursing Unit Anesthesia Type: Spinal Level of consciousness: awake, oriented and awake and alert Pain management: pain level controlled Vital Signs Assessment: post-procedure vital signs reviewed and stable Respiratory status: spontaneous breathing, respiratory function stable and nonlabored ventilation Cardiovascular status: blood pressure returned to baseline and stable Postop Assessment: no headache and no backache Anesthetic complications: no   No notable events documented.   Last Vitals:  Vitals:   07/13/21 2031 07/14/21 0558  BP: (!) 126/57 (!) 174/69  Pulse: 77 79  Resp: 12 16  Temp: 36.9 C 36.9 C  SpO2: 99% 96%    Last Pain:  Vitals:   07/14/21 0641  TempSrc:   PainSc: 3                  Chiropodist

## 2021-07-15 ENCOUNTER — Ambulatory Visit: Payer: PPO | Admitting: Family Medicine

## 2021-07-15 DIAGNOSIS — E440 Moderate protein-calorie malnutrition: Secondary | ICD-10-CM | POA: Diagnosis not present

## 2021-07-15 DIAGNOSIS — Z79890 Hormone replacement therapy (postmenopausal): Secondary | ICD-10-CM | POA: Diagnosis not present

## 2021-07-15 DIAGNOSIS — W1839XA Other fall on same level, initial encounter: Secondary | ICD-10-CM | POA: Diagnosis present

## 2021-07-15 DIAGNOSIS — S7292XA Unspecified fracture of left femur, initial encounter for closed fracture: Secondary | ICD-10-CM | POA: Diagnosis not present

## 2021-07-15 DIAGNOSIS — I10 Essential (primary) hypertension: Secondary | ICD-10-CM | POA: Diagnosis not present

## 2021-07-15 DIAGNOSIS — S72302D Unspecified fracture of shaft of left femur, subsequent encounter for closed fracture with routine healing: Secondary | ICD-10-CM | POA: Diagnosis not present

## 2021-07-15 DIAGNOSIS — M79662 Pain in left lower leg: Secondary | ICD-10-CM | POA: Diagnosis not present

## 2021-07-15 DIAGNOSIS — E782 Mixed hyperlipidemia: Secondary | ICD-10-CM | POA: Diagnosis not present

## 2021-07-15 DIAGNOSIS — D75839 Thrombocytosis, unspecified: Secondary | ICD-10-CM | POA: Diagnosis not present

## 2021-07-15 DIAGNOSIS — E1122 Type 2 diabetes mellitus with diabetic chronic kidney disease: Secondary | ICD-10-CM | POA: Diagnosis not present

## 2021-07-15 DIAGNOSIS — K5900 Constipation, unspecified: Secondary | ICD-10-CM | POA: Diagnosis not present

## 2021-07-15 DIAGNOSIS — E119 Type 2 diabetes mellitus without complications: Secondary | ICD-10-CM | POA: Diagnosis not present

## 2021-07-15 DIAGNOSIS — Z6822 Body mass index (BMI) 22.0-22.9, adult: Secondary | ICD-10-CM | POA: Diagnosis not present

## 2021-07-15 DIAGNOSIS — Z743 Need for continuous supervision: Secondary | ICD-10-CM | POA: Diagnosis not present

## 2021-07-15 DIAGNOSIS — M1612 Unilateral primary osteoarthritis, left hip: Secondary | ICD-10-CM | POA: Diagnosis not present

## 2021-07-15 DIAGNOSIS — S32512A Fracture of superior rim of left pubis, initial encounter for closed fracture: Secondary | ICD-10-CM | POA: Diagnosis not present

## 2021-07-15 DIAGNOSIS — Z79899 Other long term (current) drug therapy: Secondary | ICD-10-CM | POA: Diagnosis not present

## 2021-07-15 DIAGNOSIS — E559 Vitamin D deficiency, unspecified: Secondary | ICD-10-CM | POA: Diagnosis not present

## 2021-07-15 DIAGNOSIS — K219 Gastro-esophageal reflux disease without esophagitis: Secondary | ICD-10-CM | POA: Diagnosis not present

## 2021-07-15 DIAGNOSIS — E7800 Pure hypercholesterolemia, unspecified: Secondary | ICD-10-CM | POA: Diagnosis not present

## 2021-07-15 DIAGNOSIS — Z8781 Personal history of (healed) traumatic fracture: Secondary | ICD-10-CM | POA: Diagnosis not present

## 2021-07-15 DIAGNOSIS — W19XXXD Unspecified fall, subsequent encounter: Secondary | ICD-10-CM | POA: Diagnosis not present

## 2021-07-15 DIAGNOSIS — S72402D Unspecified fracture of lower end of left femur, subsequent encounter for closed fracture with routine healing: Secondary | ICD-10-CM | POA: Diagnosis not present

## 2021-07-15 DIAGNOSIS — E063 Autoimmune thyroiditis: Secondary | ICD-10-CM | POA: Diagnosis not present

## 2021-07-15 DIAGNOSIS — I129 Hypertensive chronic kidney disease with stage 1 through stage 4 chronic kidney disease, or unspecified chronic kidney disease: Secondary | ICD-10-CM | POA: Diagnosis not present

## 2021-07-15 DIAGNOSIS — F32A Depression, unspecified: Secondary | ICD-10-CM | POA: Diagnosis not present

## 2021-07-15 DIAGNOSIS — T84125A Displacement of internal fixation device of left femur, initial encounter: Secondary | ICD-10-CM | POA: Diagnosis not present

## 2021-07-15 DIAGNOSIS — D62 Acute posthemorrhagic anemia: Secondary | ICD-10-CM | POA: Diagnosis not present

## 2021-07-15 DIAGNOSIS — R5381 Other malaise: Secondary | ICD-10-CM | POA: Diagnosis not present

## 2021-07-15 DIAGNOSIS — N183 Chronic kidney disease, stage 3 unspecified: Secondary | ICD-10-CM | POA: Diagnosis not present

## 2021-07-15 DIAGNOSIS — E039 Hypothyroidism, unspecified: Secondary | ICD-10-CM | POA: Diagnosis not present

## 2021-07-15 DIAGNOSIS — S72392A Other fracture of shaft of left femur, initial encounter for closed fracture: Secondary | ICD-10-CM | POA: Diagnosis not present

## 2021-07-15 DIAGNOSIS — M62838 Other muscle spasm: Secondary | ICD-10-CM | POA: Diagnosis not present

## 2021-07-15 DIAGNOSIS — E785 Hyperlipidemia, unspecified: Secondary | ICD-10-CM | POA: Diagnosis not present

## 2021-07-15 DIAGNOSIS — R9431 Abnormal electrocardiogram [ECG] [EKG]: Secondary | ICD-10-CM | POA: Diagnosis not present

## 2021-07-15 DIAGNOSIS — S728X2A Other fracture of left femur, initial encounter for closed fracture: Secondary | ICD-10-CM | POA: Diagnosis not present

## 2021-07-15 DIAGNOSIS — S72402A Unspecified fracture of lower end of left femur, initial encounter for closed fracture: Secondary | ICD-10-CM | POA: Diagnosis not present

## 2021-07-15 DIAGNOSIS — E038 Other specified hypothyroidism: Secondary | ICD-10-CM | POA: Diagnosis not present

## 2021-07-15 DIAGNOSIS — E222 Syndrome of inappropriate secretion of antidiuretic hormone: Secondary | ICD-10-CM | POA: Diagnosis not present

## 2021-07-15 DIAGNOSIS — G2581 Restless legs syndrome: Secondary | ICD-10-CM | POA: Diagnosis not present

## 2021-07-15 DIAGNOSIS — S72492A Other fracture of lower end of left femur, initial encounter for closed fracture: Secondary | ICD-10-CM | POA: Diagnosis not present

## 2021-07-15 DIAGNOSIS — Y9289 Other specified places as the place of occurrence of the external cause: Secondary | ICD-10-CM | POA: Diagnosis not present

## 2021-07-15 DIAGNOSIS — Z7901 Long term (current) use of anticoagulants: Secondary | ICD-10-CM | POA: Diagnosis not present

## 2021-07-15 DIAGNOSIS — M8580 Other specified disorders of bone density and structure, unspecified site: Secondary | ICD-10-CM | POA: Diagnosis not present

## 2021-07-15 DIAGNOSIS — S72332A Displaced oblique fracture of shaft of left femur, initial encounter for closed fracture: Secondary | ICD-10-CM | POA: Diagnosis not present

## 2021-07-15 DIAGNOSIS — E871 Hypo-osmolality and hyponatremia: Secondary | ICD-10-CM | POA: Diagnosis not present

## 2021-07-15 DIAGNOSIS — N1831 Chronic kidney disease, stage 3a: Secondary | ICD-10-CM | POA: Diagnosis not present

## 2021-07-15 DIAGNOSIS — M25562 Pain in left knee: Secondary | ICD-10-CM | POA: Diagnosis not present

## 2021-07-15 DIAGNOSIS — K5904 Chronic idiopathic constipation: Secondary | ICD-10-CM | POA: Diagnosis not present

## 2021-07-15 DIAGNOSIS — Y792 Prosthetic and other implants, materials and accessory orthopedic devices associated with adverse incidents: Secondary | ICD-10-CM | POA: Diagnosis present

## 2021-07-15 DIAGNOSIS — Z9889 Other specified postprocedural states: Secondary | ICD-10-CM | POA: Diagnosis not present

## 2021-07-15 DIAGNOSIS — R531 Weakness: Secondary | ICD-10-CM | POA: Diagnosis not present

## 2021-07-15 DIAGNOSIS — F419 Anxiety disorder, unspecified: Secondary | ICD-10-CM | POA: Diagnosis not present

## 2021-07-15 DIAGNOSIS — N390 Urinary tract infection, site not specified: Secondary | ICD-10-CM | POA: Diagnosis not present

## 2021-07-15 LAB — CBC
HCT: 24.6 % — ABNORMAL LOW (ref 36.0–46.0)
Hemoglobin: 8.5 g/dL — ABNORMAL LOW (ref 12.0–15.0)
MCH: 30.5 pg (ref 26.0–34.0)
MCHC: 34.6 g/dL (ref 30.0–36.0)
MCV: 88.2 fL (ref 80.0–100.0)
Platelets: 174 10*3/uL (ref 150–400)
RBC: 2.79 MIL/uL — ABNORMAL LOW (ref 3.87–5.11)
RDW: 13.5 % (ref 11.5–15.5)
WBC: 10 10*3/uL (ref 4.0–10.5)
nRBC: 0 % (ref 0.0–0.2)

## 2021-07-15 LAB — BASIC METABOLIC PANEL
Anion gap: 6 (ref 5–15)
BUN: 22 mg/dL (ref 8–23)
CO2: 26 mmol/L (ref 22–32)
Calcium: 8.6 mg/dL — ABNORMAL LOW (ref 8.9–10.3)
Chloride: 95 mmol/L — ABNORMAL LOW (ref 98–111)
Creatinine, Ser: 1.04 mg/dL — ABNORMAL HIGH (ref 0.44–1.00)
GFR, Estimated: 51 mL/min — ABNORMAL LOW (ref >60)
Glucose, Bld: 176 mg/dL — ABNORMAL HIGH (ref 70–99)
Potassium: 4.8 mmol/L (ref 3.5–5.1)
Sodium: 127 mmol/L — ABNORMAL LOW (ref 135–145)

## 2021-07-15 LAB — TYPE AND SCREEN
ABO/RH(D): AB POS
Antibody Screen: NEGATIVE
Unit division: 0

## 2021-07-15 LAB — GLUCOSE, CAPILLARY: Glucose-Capillary: 162 mg/dL — ABNORMAL HIGH (ref 70–99)

## 2021-07-15 LAB — BPAM RBC
Blood Product Expiration Date: 202307042359
ISSUE DATE / TIME: 202306260959
Unit Type and Rh: 7300

## 2021-07-15 MED ORDER — SENNA 8.6 MG PO TABS: 1.0000 | ORAL_TABLET | Freq: Every day | ORAL | 0 refills | Status: AC

## 2021-07-15 MED ORDER — HYDROCODONE-ACETAMINOPHEN 5-325 MG PO TABS
1.0000 | ORAL_TABLET | ORAL | 0 refills | Status: DC | PRN
Start: 2021-07-15 — End: 2021-07-25

## 2021-07-15 MED ORDER — CEPHALEXIN 250 MG PO TABS: 250.0000 mg | ORAL_TABLET | Freq: Every day | ORAL | Status: DC

## 2021-07-15 MED ORDER — SODIUM CHLORIDE 1 G PO TABS: 1.0000 g | ORAL_TABLET | Freq: Two times a day (BID) | ORAL | Status: DC

## 2021-07-15 MED ORDER — ENOXAPARIN SODIUM 40 MG/0.4ML IJ SOSY: 40.0000 mg | PREFILLED_SYRINGE | INTRAMUSCULAR | Status: DC

## 2021-07-15 NOTE — Plan of Care (Signed)
Patient discharged per MD orders at this time.All discharge instructions,education and medications reviewed with the patient.Pt expressed understanding and will comply with dc instructions.follow up appointments was also communicated to the Pt.no verbal c/o or any ssx of distress.Pt was discharged to the [facility] for STR/PT services oer order.report was called to staff nurse,Ms Biagio Borg before transport.Pt was transported by 2 [facility] personnel on a stretcher.

## 2021-07-18 DIAGNOSIS — D62 Acute posthemorrhagic anemia: Secondary | ICD-10-CM | POA: Diagnosis not present

## 2021-07-18 DIAGNOSIS — E871 Hypo-osmolality and hyponatremia: Secondary | ICD-10-CM | POA: Diagnosis not present

## 2021-07-18 DIAGNOSIS — F32A Depression, unspecified: Secondary | ICD-10-CM | POA: Diagnosis not present

## 2021-07-18 DIAGNOSIS — E038 Other specified hypothyroidism: Secondary | ICD-10-CM | POA: Diagnosis not present

## 2021-07-18 DIAGNOSIS — E119 Type 2 diabetes mellitus without complications: Secondary | ICD-10-CM | POA: Diagnosis not present

## 2021-07-18 DIAGNOSIS — E782 Mixed hyperlipidemia: Secondary | ICD-10-CM | POA: Diagnosis not present

## 2021-07-18 DIAGNOSIS — K5904 Chronic idiopathic constipation: Secondary | ICD-10-CM | POA: Diagnosis not present

## 2021-07-18 DIAGNOSIS — I10 Essential (primary) hypertension: Secondary | ICD-10-CM | POA: Diagnosis not present

## 2021-07-18 DIAGNOSIS — K219 Gastro-esophageal reflux disease without esophagitis: Secondary | ICD-10-CM | POA: Diagnosis not present

## 2021-07-18 DIAGNOSIS — N390 Urinary tract infection, site not specified: Secondary | ICD-10-CM | POA: Diagnosis not present

## 2021-07-18 DIAGNOSIS — S72402D Unspecified fracture of lower end of left femur, subsequent encounter for closed fracture with routine healing: Secondary | ICD-10-CM | POA: Diagnosis not present

## 2021-07-21 ENCOUNTER — Inpatient Hospital Stay (HOSPITAL_COMMUNITY)
Admission: EM | Admit: 2021-07-21 | Discharge: 2021-07-28 | DRG: 481 | Disposition: A | Discharge status: Skilled Nursing Facility | Payer: PPO | Source: Skilled Nursing Facility | Attending: Family Medicine | Admitting: Family Medicine

## 2021-07-21 ENCOUNTER — Emergency Department (HOSPITAL_COMMUNITY): Payer: PPO

## 2021-07-21 ENCOUNTER — Telehealth: Payer: Self-pay

## 2021-07-21 ENCOUNTER — Telehealth: Payer: PPO

## 2021-07-21 ENCOUNTER — Encounter (HOSPITAL_COMMUNITY): Payer: Self-pay | Admitting: Pharmacy Technician

## 2021-07-21 DIAGNOSIS — N1832 Chronic kidney disease, stage 3b: Secondary | ICD-10-CM | POA: Diagnosis not present

## 2021-07-21 DIAGNOSIS — T84125A Displacement of internal fixation device of left femur, initial encounter: Secondary | ICD-10-CM | POA: Diagnosis present

## 2021-07-21 DIAGNOSIS — N390 Urinary tract infection, site not specified: Secondary | ICD-10-CM | POA: Diagnosis not present

## 2021-07-21 DIAGNOSIS — K5900 Constipation, unspecified: Secondary | ICD-10-CM | POA: Diagnosis not present

## 2021-07-21 DIAGNOSIS — M25562 Pain in left knee: Secondary | ICD-10-CM | POA: Diagnosis not present

## 2021-07-21 DIAGNOSIS — N1831 Chronic kidney disease, stage 3a: Secondary | ICD-10-CM | POA: Diagnosis present

## 2021-07-21 DIAGNOSIS — D62 Acute posthemorrhagic anemia: Secondary | ICD-10-CM | POA: Diagnosis not present

## 2021-07-21 DIAGNOSIS — M8580 Other specified disorders of bone density and structure, unspecified site: Secondary | ICD-10-CM | POA: Diagnosis not present

## 2021-07-21 DIAGNOSIS — S72492A Other fracture of lower end of left femur, initial encounter for closed fracture: Secondary | ICD-10-CM | POA: Diagnosis not present

## 2021-07-21 DIAGNOSIS — M9702XD Periprosthetic fracture around internal prosthetic left hip joint, subsequent encounter: Secondary | ICD-10-CM | POA: Diagnosis not present

## 2021-07-21 DIAGNOSIS — Y792 Prosthetic and other implants, materials and accessory orthopedic devices associated with adverse incidents: Secondary | ICD-10-CM | POA: Diagnosis present

## 2021-07-21 DIAGNOSIS — Y9289 Other specified places as the place of occurrence of the external cause: Secondary | ICD-10-CM | POA: Diagnosis not present

## 2021-07-21 DIAGNOSIS — F419 Anxiety disorder, unspecified: Secondary | ICD-10-CM | POA: Diagnosis present

## 2021-07-21 DIAGNOSIS — E222 Syndrome of inappropriate secretion of antidiuretic hormone: Secondary | ICD-10-CM | POA: Diagnosis present

## 2021-07-21 DIAGNOSIS — F32A Depression, unspecified: Secondary | ICD-10-CM | POA: Diagnosis present

## 2021-07-21 DIAGNOSIS — M62838 Other muscle spasm: Secondary | ICD-10-CM | POA: Diagnosis not present

## 2021-07-21 DIAGNOSIS — N183 Chronic kidney disease, stage 3 unspecified: Secondary | ICD-10-CM | POA: Diagnosis not present

## 2021-07-21 DIAGNOSIS — E871 Hypo-osmolality and hyponatremia: Secondary | ICD-10-CM | POA: Diagnosis not present

## 2021-07-21 DIAGNOSIS — E039 Hypothyroidism, unspecified: Secondary | ICD-10-CM | POA: Diagnosis present

## 2021-07-21 DIAGNOSIS — R9431 Abnormal electrocardiogram [ECG] [EKG]: Secondary | ICD-10-CM | POA: Diagnosis not present

## 2021-07-21 DIAGNOSIS — K219 Gastro-esophageal reflux disease without esophagitis: Secondary | ICD-10-CM | POA: Diagnosis present

## 2021-07-21 DIAGNOSIS — E063 Autoimmune thyroiditis: Secondary | ICD-10-CM | POA: Diagnosis not present

## 2021-07-21 DIAGNOSIS — G2581 Restless legs syndrome: Secondary | ICD-10-CM | POA: Diagnosis present

## 2021-07-21 DIAGNOSIS — S72392A Other fracture of shaft of left femur, initial encounter for closed fracture: Principal | ICD-10-CM | POA: Diagnosis present

## 2021-07-21 DIAGNOSIS — Z7901 Long term (current) use of anticoagulants: Secondary | ICD-10-CM | POA: Diagnosis not present

## 2021-07-21 DIAGNOSIS — R531 Weakness: Secondary | ICD-10-CM | POA: Diagnosis not present

## 2021-07-21 DIAGNOSIS — S72402D Unspecified fracture of lower end of left femur, subsequent encounter for closed fracture with routine healing: Secondary | ICD-10-CM | POA: Diagnosis not present

## 2021-07-21 DIAGNOSIS — E119 Type 2 diabetes mellitus without complications: Secondary | ICD-10-CM | POA: Diagnosis not present

## 2021-07-21 DIAGNOSIS — Z6822 Body mass index (BMI) 22.0-22.9, adult: Secondary | ICD-10-CM | POA: Diagnosis not present

## 2021-07-21 DIAGNOSIS — D75839 Thrombocytosis, unspecified: Secondary | ICD-10-CM | POA: Diagnosis present

## 2021-07-21 DIAGNOSIS — Z79890 Hormone replacement therapy (postmenopausal): Secondary | ICD-10-CM

## 2021-07-21 DIAGNOSIS — M9701XA Periprosthetic fracture around internal prosthetic right hip joint, initial encounter: Secondary | ICD-10-CM | POA: Diagnosis not present

## 2021-07-21 DIAGNOSIS — Z4789 Encounter for other orthopedic aftercare: Secondary | ICD-10-CM | POA: Diagnosis not present

## 2021-07-21 DIAGNOSIS — W1839XA Other fall on same level, initial encounter: Secondary | ICD-10-CM | POA: Diagnosis present

## 2021-07-21 DIAGNOSIS — E559 Vitamin D deficiency, unspecified: Secondary | ICD-10-CM | POA: Diagnosis not present

## 2021-07-21 DIAGNOSIS — M1612 Unilateral primary osteoarthritis, left hip: Secondary | ICD-10-CM | POA: Diagnosis not present

## 2021-07-21 DIAGNOSIS — E038 Other specified hypothyroidism: Secondary | ICD-10-CM

## 2021-07-21 DIAGNOSIS — I10 Essential (primary) hypertension: Secondary | ICD-10-CM | POA: Diagnosis not present

## 2021-07-21 DIAGNOSIS — S72332A Displaced oblique fracture of shaft of left femur, initial encounter for closed fracture: Secondary | ICD-10-CM | POA: Diagnosis not present

## 2021-07-21 DIAGNOSIS — E782 Mixed hyperlipidemia: Secondary | ICD-10-CM | POA: Diagnosis not present

## 2021-07-21 DIAGNOSIS — E7800 Pure hypercholesterolemia, unspecified: Secondary | ICD-10-CM | POA: Diagnosis not present

## 2021-07-21 DIAGNOSIS — E440 Moderate protein-calorie malnutrition: Secondary | ICD-10-CM | POA: Diagnosis not present

## 2021-07-21 DIAGNOSIS — K5904 Chronic idiopathic constipation: Secondary | ICD-10-CM | POA: Diagnosis not present

## 2021-07-21 DIAGNOSIS — W19XXXA Unspecified fall, initial encounter: Secondary | ICD-10-CM | POA: Diagnosis not present

## 2021-07-21 DIAGNOSIS — M79662 Pain in left lower leg: Secondary | ICD-10-CM | POA: Diagnosis not present

## 2021-07-21 DIAGNOSIS — S7292XA Unspecified fracture of left femur, initial encounter for closed fracture: Secondary | ICD-10-CM | POA: Diagnosis not present

## 2021-07-21 DIAGNOSIS — S728X2A Other fracture of left femur, initial encounter for closed fracture: Secondary | ICD-10-CM | POA: Diagnosis not present

## 2021-07-21 DIAGNOSIS — Z8781 Personal history of (healed) traumatic fracture: Secondary | ICD-10-CM | POA: Diagnosis not present

## 2021-07-21 DIAGNOSIS — N189 Chronic kidney disease, unspecified: Secondary | ICD-10-CM | POA: Diagnosis not present

## 2021-07-21 DIAGNOSIS — S72402A Unspecified fracture of lower end of left femur, initial encounter for closed fracture: Secondary | ICD-10-CM | POA: Diagnosis not present

## 2021-07-21 DIAGNOSIS — E785 Hyperlipidemia, unspecified: Secondary | ICD-10-CM | POA: Diagnosis present

## 2021-07-21 DIAGNOSIS — M9702XA Periprosthetic fracture around internal prosthetic left hip joint, initial encounter: Secondary | ICD-10-CM | POA: Diagnosis not present

## 2021-07-21 DIAGNOSIS — S32512A Fracture of superior rim of left pubis, initial encounter for closed fracture: Secondary | ICD-10-CM | POA: Diagnosis not present

## 2021-07-21 DIAGNOSIS — Z9889 Other specified postprocedural states: Secondary | ICD-10-CM | POA: Diagnosis not present

## 2021-07-21 DIAGNOSIS — Z7401 Bed confinement status: Secondary | ICD-10-CM | POA: Diagnosis not present

## 2021-07-21 DIAGNOSIS — E1122 Type 2 diabetes mellitus with diabetic chronic kidney disease: Secondary | ICD-10-CM | POA: Diagnosis present

## 2021-07-21 DIAGNOSIS — Z79899 Other long term (current) drug therapy: Secondary | ICD-10-CM | POA: Diagnosis not present

## 2021-07-21 DIAGNOSIS — I129 Hypertensive chronic kidney disease with stage 1 through stage 4 chronic kidney disease, or unspecified chronic kidney disease: Secondary | ICD-10-CM | POA: Diagnosis present

## 2021-07-21 DIAGNOSIS — S79929A Unspecified injury of unspecified thigh, initial encounter: Secondary | ICD-10-CM | POA: Diagnosis not present

## 2021-07-21 LAB — BASIC METABOLIC PANEL
Anion gap: 11 (ref 5–15)
BUN: 20 mg/dL (ref 8–23)
CO2: 24 mmol/L (ref 22–32)
Calcium: 9.2 mg/dL (ref 8.9–10.3)
Chloride: 92 mmol/L — ABNORMAL LOW (ref 98–111)
Creatinine, Ser: 1.02 mg/dL — ABNORMAL HIGH (ref 0.44–1.00)
GFR, Estimated: 53 mL/min — ABNORMAL LOW (ref >60)
Glucose, Bld: 159 mg/dL — ABNORMAL HIGH (ref 70–99)
Potassium: 4.6 mmol/L (ref 3.5–5.1)
Sodium: 127 mmol/L — ABNORMAL LOW (ref 135–145)

## 2021-07-21 LAB — CBC
HCT: 26 % — ABNORMAL LOW (ref 36.0–46.0)
Hemoglobin: 8.5 g/dL — ABNORMAL LOW (ref 12.0–15.0)
MCH: 30.6 pg (ref 26.0–34.0)
MCHC: 32.7 g/dL (ref 30.0–36.0)
MCV: 93.5 fL (ref 80.0–100.0)
Platelets: 519 10*3/uL — ABNORMAL HIGH (ref 150–400)
RBC: 2.78 MIL/uL — ABNORMAL LOW (ref 3.87–5.11)
RDW: 13.1 % (ref 11.5–15.5)
WBC: 11.1 10*3/uL — ABNORMAL HIGH (ref 4.0–10.5)
nRBC: 0 % (ref 0.0–0.2)

## 2021-07-21 MED ORDER — HYDROCODONE-ACETAMINOPHEN 5-325 MG PO TABS
1.0000 | ORAL_TABLET | Freq: Once | ORAL | Status: AC
  Administered 2021-07-21: 1 via ORAL
  Filled 2021-07-21: qty 1

## 2021-07-21 MED ORDER — LABETALOL HCL 5 MG/ML IV SOLN: 10.0000 mg | INTRAVENOUS | Status: DC | PRN

## 2021-07-21 MED ORDER — SENNA 8.6 MG PO TABS: 1.0000 | ORAL_TABLET | Freq: Every day | ORAL | Status: DC | PRN

## 2021-07-21 MED ORDER — OXYCODONE-ACETAMINOPHEN 5-325 MG PO TABS: 1.0000 | ORAL_TABLET | Freq: Once | ORAL | Status: DC

## 2021-07-21 MED ORDER — ACETAMINOPHEN 325 MG PO TABS: 650.0000 mg | ORAL_TABLET | Freq: Four times a day (QID) | ORAL | Status: DC | PRN

## 2021-07-21 MED ORDER — SODIUM CHLORIDE 0.9 % IV SOLN: INTRAVENOUS | Status: DC

## 2021-07-21 MED ORDER — INSULIN ASPART 100 UNIT/ML IJ SOLN
0.0000 [IU] | INTRAMUSCULAR | Status: DC
  Administered 2021-07-22: 2 [IU] via SUBCUTANEOUS
  Administered 2021-07-22 (×2): 1 [IU] via SUBCUTANEOUS
  Administered 2021-07-23: 4 [IU] via SUBCUTANEOUS

## 2021-07-21 MED ORDER — FENTANYL CITRATE PF 50 MCG/ML IJ SOSY: 12.5000 ug | PREFILLED_SYRINGE | INTRAMUSCULAR | Status: DC | PRN

## 2021-07-21 NOTE — H&P (Addendum)
History and Physical    Lisa Mills XPX:[id number] DOB: 18-Aug-1929 DOA: 07/21/2021  PCP: [name], DO   Patient coming from: SNF   Chief Complaint: Left leg pain   HPI: Lisa Mills is a pleasant 88 y.o. female with medical history significant for diet-controlled diabetes mellitus, hypothyroidism, hypertension, recurrent UTIs, CKD 3A, chronic anemia, chronic hyponatremia, and left femoral shaft fracture status post reduction and internal fixation on 07/13/2021, now presenting from her SNF for evaluation of left leg pain.  After the recent hospitalization for surgical repair of the left femoral shaft fracture, patient went to acute rehab facility where she reports progressing quite well until few days ago when she was working with PT, shifted her weight from the right foot to the left, and felt her leg "give out."  Since then, she has had reduced range of motion, pain with movement, and difficulty bearing weight.  She had radiographs performed at the facility and was sent to the ED for further evaluation and management.  She denies any chest pain, shortness of breath, cough, fever, or chills.  ED Course: Upon arrival to the ED, patient is found to be afebrile and saturating well on room air with stable blood pressure.  Chemistry panel with stable hyponatremia and renal insufficiency.  CBC was stable normocytic anemia and new thrombocytosis.  Radiographs of the left femur demonstrate new fracture of the distal femur fracture fragment with dislocation of the intramedullary rod and loosening of screws.  The ED physician consulted with with patient's orthopedic surgeon, Dr. Joice Lofts, who reported that he had discussed this case with Dr. Jena Gauss of orthopedic surgery who would be evaluating the patient here at [facility].  Review of Systems:  All other systems reviewed and apart from HPI, are negative.  Past Medical History:  Diagnosis Date   Anemia    Anxiety    Chronic kidney disease     stage 3-4   Constipation    Depression    Dermatophytosis of nail    Diabetes mellitus without complication (HCC)    Diet controlled, lost weight, no meds   GERD (gastroesophageal reflux disease)    Hemorrhoids    History of shingles    HLD (hyperlipidemia)    Hypertension    Hypothyroidism    Keratoderma    Restless legs syndrome (RLS)    hx    Past Surgical History:  Procedure Laterality Date   BREAST BIOPSY Right 09/30/2017   Affirm bx-calcs ( X clip),benign   BREAST EXCISIONAL BIOPSY Right    neg   cataracts     COLONOSCOPY W/ POLYPECTOMY     COLONOSCOPY WITH PROPOFOL N/A 02/10/2017   Procedure: COLONOSCOPY WITH PROPOFOL;  Surgeon: Scot Jun, MD;  Location: [facility] ENDOSCOPY;  Service: Endoscopy;  Laterality: N/A;   ESOPHAGOGASTRODUODENOSCOPY (EGD) WITH PROPOFOL N/A 02/10/2017   Procedure: ESOPHAGOGASTRODUODENOSCOPY (EGD) WITH PROPOFOL;  Surgeon: Scot Jun, MD;  Location: [facility] ENDOSCOPY;  Service: Endoscopy;  Laterality: N/A;   EYE SURGERY     bilateral cataracts   HAND SURGERY     INTRAMEDULLARY (IM) NAIL INTERTROCHANTERIC Left 07/13/2021   Procedure: INTRAMEDULLARY (IM) NAIL INTERTROCHANTRIC;  Surgeon: Christena Flake, MD;  Location: [facility] ORS;  Service: Orthopedics;  Laterality: Left;   KIDNEY STONE SURGERY     ORIF HUMERUS FRACTURE Right 02/13/2020   Procedure: OPEN REDUCTION INTERNAL FIXATION (ORIF) SUPRACONDYLAR  HUMERUS FRACTURE;  Surgeon: Myrene Galas, MD;  Location: [facility] OR;  Service: Orthopedics;  Laterality: Right;  SACROPLASTY N/A 07/06/2019   Procedure: SACROPLASTY;  Surgeon: Hessie Knows, MD;  Location: [facility] ORS;  Service: Orthopedics;  Laterality: N/A;   TONSILECTOMY, ADENOIDECTOMY, BILATERAL MYRINGOTOMY AND TUBES     TONSILLECTOMY     TRIGGER FINGER RELEASE     Left ring finger   tubercular peritonitis      Social History:   reports that she has never smoked. She has never used smokeless tobacco. She reports that she does not currently use  alcohol. She reports that she does not use drugs.  Allergies  Allergen Reactions   Aspirin Other (See Comments)    Burns stomach   Conray [Iothalamate] Hives    IV dye conray-400   Dye Fdc Red [Red Dye] Hives   Sulfa Antibiotics     Unknown Reaction, not used in years   Sulfasalazine     Other reaction(s): Unknown   Prednisone     Indigestion     Family History  Adopted: Yes  Problem Relation Age of Onset   Breast cancer Neg Hx      Prior to Admission medications   Medication Sig Start Date End Date Taking? Authorizing Provider  Blood Glucose Monitoring Suppl (ONE TOUCH ULTRA 2) w/Device KIT Use to check blood sugar as advised up to 2 times daily 03/14/21   Parks Ranger, Devonne Doughty, DO  Cephalexin 250 MG tablet Take 1 tablet (250 mg total) by mouth daily in the afternoon. 07/15/21   Wouk, Ailene Rud, MD  Cholecalciferol (VITAMIN D) 50 MCG (2000 UT) CAPS Take 2,000 Units by mouth daily.     [provider]  enoxaparin (LOVENOX) 40 MG/0.4ML injection Inject 0.4 mLs (40 mg total) into the skin daily. 07/16/21 08/18/21  Wouk, Ailene Rud, MD  escitalopram (LEXAPRO) 10 MG tablet Take 1 tablet (10 mg total) by mouth daily. with food 04/22/21   Olin Hauser, DO  ferrous sulfate 325 (65 FE) MG tablet Take 325 mg by mouth daily.     [provider]  glucose blood (ONE TOUCH ULTRA TEST) test strip CHECK BLOOD SUGAR UP TO 2 TIMES A DAY. 03/14/21   Karamalegos, Devonne Doughty, DO  glycerin adult 2 g suppository Place 1 suppository rectally as needed for constipation.    [provider]  Histamine Dihydrochloride (AUSTRALIAN DREAM ARTHRITIS) 0.025 % CREA Apply 1 application topically daily as needed (Pain).    [provider]  HYDROcodone-acetaminophen (NORCO/VICODIN) 5-325 MG tablet Take 1 tablet by mouth every 4 (four) hours as needed for moderate pain (pain score 4-6). 07/15/21   Wouk, Ailene Rud, MD  hydrocortisone (ANUSOL-HC) 2.5 % rectal cream  USE 1 APPLICATION RECTALLY TWICE DAILY AS DIRECTED Patient taking differently: Place 1 application  rectally daily as needed for hemorrhoids or anal itching. 09/11/19   Parks Ranger, Devonne Doughty, DO  levothyroxine (SYNTHROID) 25 MCG tablet Take 1 tablet (25 mcg total) by mouth daily before breakfast. 11/11/20   Parks Ranger, Devonne Doughty, DO  lisinopril (ZESTRIL) 5 MG tablet TAKE 1 TABLET BY MOUTH ONCE DAILY 05/21/21   Parks Ranger, Devonne Doughty, DO  omeprazole (PRILOSEC) 20 MG capsule TAKE 1 CAPSULE BY MOUTH ONCE DAILY 01/23/21   Parks Ranger, Devonne Doughty, DO  OneTouch Delica Lancets 58P MISC Use to check blood sugar up to 2 times daily 03/14/21   Parks Ranger, Devonne Doughty, DO  polyethylene glycol (MIRALAX / GLYCOLAX) 17 g packet Take 17 g by mouth daily as needed for moderate constipation or severe constipation. Patient taking differently: Take 17 g by mouth daily.  07/24/19   Swayze, Ava, DO  senna (SENOKOT) 8.6 MG TABS tablet Take 1 tablet (8.6 mg total) by mouth daily. 07/16/21   Wouk, Ailene Rud, MD  simvastatin (ZOCOR) 40 MG tablet TAKE 1 TABLET BY MOUTH AT BEDTIME 03/19/21   Karamalegos, Devonne Doughty, DO  sodium chloride 1 g tablet Take 1 tablet (1 g total) by mouth 2 (two) times daily with a meal. 07/15/21   Wouk, Ailene Rud, MD  witch hazel-glycerin (TUCKS) pad Apply 1 application topically as needed for itching.    [provider]    Physical Exam: Vitals:   07/21/21 1522 07/21/21 1848 07/21/21 2045  BP: 128/72 (!) 157/70 (!) 183/70  Pulse: 92 90 86  Resp: $Remo'17 15 16  'rrVuh$ Temp: 99.2 F (37.3 C)    TempSrc: Oral    SpO2: 100% 99% 100%    Constitutional: NAD, calm  Eyes: PERTLA, lids and conjunctivae normal ENMT: Mucous membranes are moist. Posterior pharynx clear of any exudate or lesions.   Neck: supple, no masses  Respiratory: no wheezing, no crackles. No accessory muscle use.  Cardiovascular: S1 & S2 heard, regular rate and rhythm. Left foot edema. No JVD. Abdomen: No distension, no  tenderness, soft. Bowel sounds active.  Musculoskeletal: no clubbing / cyanosis. Left leg tenderness and deformity, neurovascularly intact.   Skin: no significant rashes, lesions, ulcers. Warm, dry, well-perfused. Neurologic: CN 2-12 grossly intact. Moving all extremities. Alert and oriented to person, place, and situation.  Psychiatric: Pleasant. Cooperative.    Labs and Imaging on Admission: I have personally reviewed following labs and imaging studies  CBC: Recent Labs  Lab 07/15/21 0458 07/21/21 1540  WBC 10.0 11.1*  HGB 8.5* 8.5*  HCT 24.6* 26.0*  MCV 88.2 93.5  PLT 174 330*   Basic Metabolic Panel: Recent Labs  Lab 07/15/21 0458 07/21/21 1540  NA 127* 127*  K 4.8 4.6  CL 95* 92*  CO2 26 24  GLUCOSE 176* 159*  BUN 22 20  CREATININE 1.04* 1.02*  CALCIUM 8.6* 9.2   GFR: Estimated Creatinine Clearance: 25.5 mL/min (A) (by C-G formula based on SCr of 1.02 mg/dL (H)). Liver Function Tests: No results for input(s): "AST", "ALT", "ALKPHOS", "BILITOT", "PROT", "ALBUMIN" in the last 168 hours. No results for input(s): "LIPASE", "AMYLASE" in the last 168 hours. No results for input(s): "AMMONIA" in the last 168 hours. Coagulation Profile: No results for input(s): "INR", "PROTIME" in the last 168 hours. Cardiac Enzymes: No results for input(s): "CKTOTAL", "CKMB", "CKMBINDEX", "TROPONINI" in the last 168 hours. BNP (last 3 results) No results for input(s): "PROBNP" in the last 8760 hours. HbA1C: No results for input(s): "HGBA1C" in the last 72 hours. CBG: Recent Labs  Lab 07/15/21 0536  GLUCAP 162*   Lipid Profile: No results for input(s): "CHOL", "HDL", "LDLCALC", "TRIG", "CHOLHDL", "LDLDIRECT" in the last 72 hours. Thyroid Function Tests: No results for input(s): "TSH", "T4TOTAL", "FREET4", "T3FREE", "THYROIDAB" in the last 72 hours. Anemia Panel: No results for input(s): "VITAMINB12", "FOLATE", "FERRITIN", "TIBC", "IRON", "RETICCTPCT" in the last 72  hours. Urine analysis:    Component Value Date/Time   COLORURINE YELLOW 02/13/2020 0622   APPEARANCEUR CLEAR 02/13/2020 0622   APPEARANCEUR Hazy (A) 10/02/2019 1509   LABSPEC 1.008 02/13/2020 0622   PHURINE 6.0 02/13/2020 0622   GLUCOSEU NEGATIVE 02/13/2020 0622   HGBUR NEGATIVE 02/13/2020 0622   BILIRUBINUR NEGATIVE 02/13/2020 0622   BILIRUBINUR Negative 10/02/2019 [place] NEGATIVE 02/13/2020 0622   PROTEINUR NEGATIVE 02/13/2020 0622  UROBILINOGEN Normal 03/29/2017 0000   NITRITE NEGATIVE 02/13/2020 0622   LEUKOCYTESUR NEGATIVE 02/13/2020 0622   Sepsis Labs: $[id number]$ (procalcitonin:4,lacticidven:4) ) Recent Results (from the past 240 hour(s))  Surgical PCR screen     Status: None   Collection Time: 07/13/21  1:20 AM   Specimen: Nasal Mucosa; Nasal Swab  Result Value Ref Range Status   MRSA, PCR NEGATIVE NEGATIVE Final   Staphylococcus aureus NEGATIVE NEGATIVE Final    Comment: (NOTE) The Xpert SA Assay (FDA approved for NASAL specimens in patients 28 years of age and older), is one component of a comprehensive surveillance program. It is not intended to diagnose infection nor to guide or monitor treatment. Performed at [facility], [address], [place], [place] [address]      Radiological Exams on Admission: DG Femur Min 2 Views Left  Result Date: 07/21/2021 CLINICAL DATA:  Fall, recent ORIF from broken femur. EXAM: PELVIS - 1-2 VIEW; LEFT FEMUR 2 VIEWS; LEFT KNEE - COMPLETE 4+ VIEW COMPARISON:  07/13/2021, 07/09/2019. FINDINGS: There is redemonstration of the oblique fracture of the mid femoral diaphysis with overlapping and medial displacement of the distal fracture fragment. There is a new superimposed fracture involving the distal femoral fragment with displacement of the intramedullary rod and loosening of the screws. The intramedullary rod now terminates in the region of the patellofemoral compartment. No dislocation at the hip or knee.  Moderate degenerative changes are noted at the hips bilaterally and in the lower lumbar spine. No joint effusion at the knee. Vascular calcifications are noted in the soft tissues. There are old fractures of the superior and inferior pubic ramus on the left. Bilateral sacral plasty changes are noted in the pelvis and unchanged from prior exams. IMPRESSION: Status post ORIF with displaced oblique fracture of the mid femoral shaft with overlapping and medial displacement of the distal fracture fragment. There is a new superimposed fracture of the distal femoral fracture fragment with dislocation of the intramedullary rod and loosening of the screws. Orthopedic surgical consultation is recommended. Electronically Signed   By: Brett Fairy M.D.   On: 07/21/2021 20:38   DG Knee Complete 4 Views Left  Result Date: 07/21/2021 CLINICAL DATA:  Fall, recent ORIF from broken femur. EXAM: PELVIS - 1-2 VIEW; LEFT FEMUR 2 VIEWS; LEFT KNEE - COMPLETE 4+ VIEW COMPARISON:  07/13/2021, 07/09/2019. FINDINGS: There is redemonstration of the oblique fracture of the mid femoral diaphysis with overlapping and medial displacement of the distal fracture fragment. There is a new superimposed fracture involving the distal femoral fragment with displacement of the intramedullary rod and loosening of the screws. The intramedullary rod now terminates in the region of the patellofemoral compartment. No dislocation at the hip or knee. Moderate degenerative changes are noted at the hips bilaterally and in the lower lumbar spine. No joint effusion at the knee. Vascular calcifications are noted in the soft tissues. There are old fractures of the superior and inferior pubic ramus on the left. Bilateral sacral plasty changes are noted in the pelvis and unchanged from prior exams. IMPRESSION: Status post ORIF with displaced oblique fracture of the mid femoral shaft with overlapping and medial displacement of the distal fracture fragment. There is a  new superimposed fracture of the distal femoral fracture fragment with dislocation of the intramedullary rod and loosening of the screws. Orthopedic surgical consultation is recommended. Electronically Signed   By: Brett Fairy M.D.   On: 07/21/2021 20:38   DG Pelvis 1-2 Views  Result Date: 07/21/2021  CLINICAL DATA:  Fall, recent ORIF from broken femur. EXAM: PELVIS - 1-2 VIEW; LEFT FEMUR 2 VIEWS; LEFT KNEE - COMPLETE 4+ VIEW COMPARISON:  07/13/2021, 07/09/2019. FINDINGS: There is redemonstration of the oblique fracture of the mid femoral diaphysis with overlapping and medial displacement of the distal fracture fragment. There is a new superimposed fracture involving the distal femoral fragment with displacement of the intramedullary rod and loosening of the screws. The intramedullary rod now terminates in the region of the patellofemoral compartment. No dislocation at the hip or knee. Moderate degenerative changes are noted at the hips bilaterally and in the lower lumbar spine. No joint effusion at the knee. Vascular calcifications are noted in the soft tissues. There are old fractures of the superior and inferior pubic ramus on the left. Bilateral sacral plasty changes are noted in the pelvis and unchanged from prior exams. IMPRESSION: Status post ORIF with displaced oblique fracture of the mid femoral shaft with overlapping and medial displacement of the distal fracture fragment. There is a new superimposed fracture of the distal femoral fracture fragment with dislocation of the intramedullary rod and loosening of the screws. Orthopedic surgical consultation is recommended. Electronically Signed   By: Brett Fairy M.D.   On: 07/21/2021 20:38    EKG: Independently reviewed. SR, LVH with repolarization abnormality.   Assessment/Plan   1. Periprosthetic left femoral shaft fracture  - S/p ORIF on 6/25 now presenting with pain and deformity after feeling the leg "give out" a few days ago  - Plain  radiographs demonstrate new fracture involving distal fracture fragment with displaced IM rod and loosened screws  - Dr. Doreatha Martin of orthopedic surgery will be consulting  - Keep NPO after midnight, continue pain-control and supportive care    2. Type II DM  - A1c was 7.5% in February 2023  - Check CBGs and use low-intensity SSI if needed   3. Hyponatremia   - Chronic, appears stable, attributed to SIADH and recently started on salt tabs   - Restrict free-water, monitor   4. CKD IIIa  - Appears stable  - Renally-dose medications   5. Hypothyroidism  - On Synthroid    6. Hypertension  - Hold ACE-i perioperatively, treat as-needed only for now    DVT prophylaxis: SCDs  Code Status: Full  Level of Care: Level of care: Med-Surg Family Communication: none present  Disposition Plan:  Patient is from: SNF  Anticipated d/c is to: SNF  Anticipated d/c date is: 07/25/21  Patient currently: pending orthopedic consult and likely operative femur repair  Consults called: Orthopedic surgery  Admission status: Inpatient     Vianne Bulls, MD [facility]  07/21/2021, 10:44 PM

## 2021-07-21 NOTE — ED Provider Triage Note (Addendum)
Emergency Medicine Provider Triage Evaluation Note  Lisa Mills , a 88 y.o. female  was evaluated in triage.  Pt complains of Patient with left hip, left femur, left knee pain.  She had recent ORIF from broken femur.  She was in rehab, when she felt sudden weakness and like her left leg gave out.  Since then patient with decreased range of motion, increased pain, swelling of the left leg.  She had some x-rays this morning which are not accessible in our system which showed loosened or abnormal hardware in the left femur.  She endorses significant pain.  She denies fever, chills.  She reports that they call the surgeon ahead of time but did not tell her the name.  There is no notes in her chart..  Review of Systems  Positive: Leg pain, hip pain, hardware issue Negative: Fever, chills, numbness, tingling  Physical Exam  BP 128/72 (BP Location: Right Arm)   Pulse 92   Temp 99.2 F (37.3 C) (Oral)   Resp 17   LMP  (LMP Unknown)   SpO2 100%  Gen:   Awake, no distress   Resp:  Normal effort  MSK:   Moves extremities without difficulty  Other:  Significant swelling of the left upper extremity compared to the right.  She has tenderness palpation of the left hip, left femur, and left knee.  Surgical site without signs of infection.  However the limb is quite warm to the touch.  She has 1+ pulses in the left lower extremity, DP, PT. strong dopplerable DP, PT pulses on the left.  Medical Decision Making  Medically screening exam initiated at 3:32 PM.  Appropriate orders placed.  Lisa Mills was informed that the remainder of the evaluation will be completed by another provider, this initial triage assessment does not replace that evaluation, and the importance of remaining in the ED until their evaluation is complete.  Workup initiated   Lisa Pickler, PA-C 07/21/21 1536    Klein Willcox, Joesph Fillers, [place] 07/21/21 1542

## 2021-07-21 NOTE — ED Triage Notes (Signed)
Pt here with reports of being sent for surgery. Pt with recent ORIF after breaking her Femur. States was sent to rehab and was doing well with her PT until a few days ago when her L leg "gave out". Since then pt with decreased ROM and increased pain. Sent for xrays this morning and was told to come here for surgical intervention. Unable to see imaging from today in the chart.

## 2021-07-21 NOTE — ED Provider Notes (Signed)
[facility] EMERGENCY DEPARTMENT Provider Note   CSN: [id number] Arrival date & time: 07/21/21  1422     History {Add pertinent medical, surgical, social history, OB history to HPI:1} Chief Complaint  Patient presents with   Leg Pain    Lisa Mills is a 88 y.o. female.   Leg Pain   Patient is an 88 year old female with a history of HTN, CKD, anemia, DM, hypothyroidism, HLD  Per old records patient was admitted to the hospital 6/24 through 6/27 following a fall while trying to lock her door after getting in from her home which.  She had a femoral shaft fracture which was repaired by Ortho through ORIF on 6/25.  She was discharged to a skilled nursing facility.  During that admission she was having issues with hyponatremia.  Her baseline sodium was in the upper 120s.  She is followed by nephrology.  Her hemoglobin was low at 7.2 in her previous admission but has been stable at 8.5 since 6/27.     Home Medications Prior to Admission medications   Medication Sig Start Date End Date Taking? Authorizing Provider  Blood Glucose Monitoring Suppl (ONE TOUCH ULTRA 2) w/Device KIT Use to check blood sugar as advised up to 2 times daily 03/14/21   Parks Ranger, Devonne Doughty, DO  Cephalexin 250 MG tablet Take 1 tablet (250 mg total) by mouth daily in the afternoon. 07/15/21   Wouk, Ailene Rud, MD  Cholecalciferol (VITAMIN D) 50 MCG (2000 UT) CAPS Take 2,000 Units by mouth daily.     [provider]  enoxaparin (LOVENOX) 40 MG/0.4ML injection Inject 0.4 mLs (40 mg total) into the skin daily. 07/16/21 08/18/21  Wouk, Ailene Rud, MD  escitalopram (LEXAPRO) 10 MG tablet Take 1 tablet (10 mg total) by mouth daily. with food 04/22/21   Olin Hauser, DO  ferrous sulfate 325 (65 FE) MG tablet Take 325 mg by mouth daily.     [provider]  glucose blood (ONE TOUCH ULTRA TEST) test strip CHECK BLOOD SUGAR UP TO 2 TIMES A DAY. 03/14/21   Karamalegos,  Devonne Doughty, DO  glycerin adult 2 g suppository Place 1 suppository rectally as needed for constipation.    [provider]  Histamine Dihydrochloride (AUSTRALIAN DREAM ARTHRITIS) 0.025 % CREA Apply 1 application topically daily as needed (Pain).    [provider]  HYDROcodone-acetaminophen (NORCO/VICODIN) 5-325 MG tablet Take 1 tablet by mouth every 4 (four) hours as needed for moderate pain (pain score 4-6). 07/15/21   Wouk, Ailene Rud, MD  hydrocortisone (ANUSOL-HC) 2.5 % rectal cream USE 1 APPLICATION RECTALLY TWICE DAILY AS DIRECTED Patient taking differently: Place 1 application  rectally daily as needed for hemorrhoids or anal itching. 09/11/19   Parks Ranger, Devonne Doughty, DO  levothyroxine (SYNTHROID) 25 MCG tablet Take 1 tablet (25 mcg total) by mouth daily before breakfast. 11/11/20   Parks Ranger, Devonne Doughty, DO  lisinopril (ZESTRIL) 5 MG tablet TAKE 1 TABLET BY MOUTH ONCE DAILY 05/21/21   Parks Ranger, Devonne Doughty, DO  omeprazole (PRILOSEC) 20 MG capsule TAKE 1 CAPSULE BY MOUTH ONCE DAILY 01/23/21   Parks Ranger, Devonne Doughty, DO  OneTouch Delica Lancets 62X MISC Use to check blood sugar up to 2 times daily 03/14/21   Parks Ranger, Devonne Doughty, DO  polyethylene glycol (MIRALAX / GLYCOLAX) 17 g packet Take 17 g by mouth daily as needed for moderate constipation or severe constipation. Patient taking differently: Take 17 g by mouth daily. 07/24/19   Swayze, Ava,  DO  senna (SENOKOT) 8.6 MG TABS tablet Take 1 tablet (8.6 mg total) by mouth daily. 07/16/21   Wouk, Ailene Rud, MD  simvastatin (ZOCOR) 40 MG tablet TAKE 1 TABLET BY MOUTH AT BEDTIME 03/19/21   Karamalegos, Devonne Doughty, DO  sodium chloride 1 g tablet Take 1 tablet (1 g total) by mouth 2 (two) times daily with a meal. 07/15/21   Wouk, Ailene Rud, MD  witch hazel-glycerin (TUCKS) pad Apply 1 application topically as needed for itching.    [provider]      Allergies    Aspirin, Conray [iothalamate], Dye fdc red  [red dye], Sulfa antibiotics, Sulfasalazine, and Prednisone    Review of Systems   Review of Systems  Physical Exam Updated Vital Signs BP (!) 157/70 (BP Location: Right Arm)   Pulse 90   Temp 99.2 F (37.3 C) (Oral)   Resp 15   LMP  (LMP Unknown)   SpO2 99%  Physical Exam  ED Results / Procedures / Treatments   Labs (all labs ordered are listed, but only abnormal results are displayed) Labs Reviewed  CBC - Abnormal; Notable for the following components:      Result Value   WBC 11.1 (*)    RBC 2.78 (*)    Hemoglobin 8.5 (*)    HCT 26.0 (*)    Platelets 519 (*)    All other components within normal limits  BASIC METABOLIC PANEL - Abnormal; Notable for the following components:   Sodium 127 (*)    Chloride 92 (*)    Glucose, Bld 159 (*)    Creatinine, Ser 1.02 (*)    GFR, Estimated 53 (*)    All other components within normal limits    EKG None  Radiology No results found.  Procedures Procedures  {Document cardiac monitor, telemetry assessment procedure when appropriate:1}  Medications Ordered in ED Medications  HYDROcodone-acetaminophen (NORCO/VICODIN) 5-325 MG per tablet 1 tablet (1 tablet Oral Given 07/21/21 1544)    ED Course/ Medical Decision Making/ A&P                           Medical Decision Making  ***  {Document critical care time when appropriate:1} {Document review of labs and clinical decision tools ie heart score, Chads2Vasc2 etc:1}  {Document your independent review of radiology images, and any outside records:1} {Document your discussion with family members, caretakers, and with consultants:1} {Document social determinants of health affecting pt's care:1} {Document your decision making why or why not admission, treatments were needed:1} Final Clinical Impression(s) / ED Diagnoses Final diagnoses:  None    Rx / DC Orders ED Discharge Orders     None

## 2021-07-21 NOTE — ED Notes (Addendum)
Pt in XRAY 

## 2021-07-21 NOTE — ED Notes (Signed)
X-ray stated that she needs to be on a bed to be able to get her x-ray .

## 2021-07-21 NOTE — ED Notes (Signed)
Patient transported to floor at this time.

## 2021-07-21 NOTE — ED Notes (Signed)
Patient brought to room.  Currently sitting in wheelchair from Rehab.  Patient assisted to stand on R leg with walker and moved to bed.

## 2021-07-21 NOTE — Telephone Encounter (Signed)
  Care Management   Follow Up Note   07/21/2021 Name: Lisa Mills MRN: [id number] DOB: August 04, 1929   Referred by: Olin Hauser, DO Reason for referral : Chronic Care Management (RNCM: Follow up for Chronic Disease Management and Care Coordination Needs )   An unsuccessful telephone outreach was attempted today. The patient was referred to the case management team for assistance with care management and care coordination. 07-21-2021: The patient had a hospital stay from 07-12-2021 to 07-15-2021 and has now transferred to rehab for further treatment and rehabilitation. Will continue to monitor and call the patient at discharge from Rehab.   Follow Up Plan: A HIPPA compliant phone message was left for the patient providing contact information and requesting a return call.   Noreene Larsson RN, MSN, [place] Mobile: [phone]

## 2021-07-22 ENCOUNTER — Encounter (HOSPITAL_COMMUNITY): Payer: Self-pay | Admitting: Family Medicine

## 2021-07-22 ENCOUNTER — Other Ambulatory Visit: Payer: Self-pay

## 2021-07-22 DIAGNOSIS — M25562 Pain in left knee: Secondary | ICD-10-CM

## 2021-07-22 DIAGNOSIS — S7292XA Unspecified fracture of left femur, initial encounter for closed fracture: Secondary | ICD-10-CM | POA: Diagnosis not present

## 2021-07-22 LAB — GLUCOSE, CAPILLARY
Glucose-Capillary: 124 mg/dL — ABNORMAL HIGH (ref 70–99)
Glucose-Capillary: 124 mg/dL — ABNORMAL HIGH (ref 70–99)
Glucose-Capillary: 129 mg/dL — ABNORMAL HIGH (ref 70–99)
Glucose-Capillary: 134 mg/dL — ABNORMAL HIGH (ref 70–99)
Glucose-Capillary: 179 mg/dL — ABNORMAL HIGH (ref 70–99)
Glucose-Capillary: 189 mg/dL — ABNORMAL HIGH (ref 70–99)
Glucose-Capillary: 215 mg/dL — ABNORMAL HIGH (ref 70–99)

## 2021-07-22 LAB — CBC
HCT: 21.9 % — ABNORMAL LOW (ref 36.0–46.0)
Hemoglobin: 7.5 g/dL — ABNORMAL LOW (ref 12.0–15.0)
MCH: 31.5 pg (ref 26.0–34.0)
MCHC: 34.2 g/dL (ref 30.0–36.0)
MCV: 92 fL (ref 80.0–100.0)
Platelets: 451 10*3/uL — ABNORMAL HIGH (ref 150–400)
RBC: 2.38 MIL/uL — ABNORMAL LOW (ref 3.87–5.11)
RDW: 13.2 % (ref 11.5–15.5)
WBC: 7.6 10*3/uL (ref 4.0–10.5)
nRBC: 0 % (ref 0.0–0.2)

## 2021-07-22 LAB — BASIC METABOLIC PANEL
Anion gap: 9 (ref 5–15)
BUN: 22 mg/dL (ref 8–23)
CO2: 26 mmol/L (ref 22–32)
Calcium: 8.9 mg/dL (ref 8.9–10.3)
Chloride: 95 mmol/L — ABNORMAL LOW (ref 98–111)
Creatinine, Ser: 0.96 mg/dL (ref 0.44–1.00)
GFR, Estimated: 57 mL/min — ABNORMAL LOW (ref >60)
Glucose, Bld: 135 mg/dL — ABNORMAL HIGH (ref 70–99)
Potassium: 4.2 mmol/L (ref 3.5–5.1)
Sodium: 130 mmol/L — ABNORMAL LOW (ref 135–145)

## 2021-07-22 MED ORDER — ESCITALOPRAM OXALATE 10 MG PO TABS
10.0000 mg | ORAL_TABLET | Freq: Every day | ORAL | Status: DC
  Administered 2021-07-22 – 2021-07-28 (×7): 10 mg via ORAL
  Filled 2021-07-22 (×7): qty 1

## 2021-07-22 MED ORDER — FERROUS SULFATE 325 (65 FE) MG PO TABS
325.0000 mg | ORAL_TABLET | Freq: Every day | ORAL | Status: DC
  Administered 2021-07-24 – 2021-07-28 (×5): 325 mg via ORAL
  Filled 2021-07-22 (×5): qty 1

## 2021-07-22 MED ORDER — ENSURE ENLIVE PO LIQD
237.0000 mL | Freq: Two times a day (BID) | ORAL | Status: DC
  Administered 2021-07-22 – 2021-07-27 (×8): 237 mL via ORAL

## 2021-07-22 MED ORDER — ADULT MULTIVITAMIN W/MINERALS CH
1.0000 | ORAL_TABLET | Freq: Every day | ORAL | Status: DC
  Administered 2021-07-22 – 2021-07-28 (×6): 1 via ORAL
  Filled 2021-07-22 (×7): qty 1

## 2021-07-22 MED ORDER — VITAMIN D3 25 MCG (1000 UNIT) PO TABS
2000.0000 [IU] | ORAL_TABLET | Freq: Every day | ORAL | Status: DC
  Administered 2021-07-22 – 2021-07-28 (×7): 2000 [IU] via ORAL
  Filled 2021-07-22 (×14): qty 2

## 2021-07-22 MED ORDER — SIMVASTATIN 20 MG PO TABS
40.0000 mg | ORAL_TABLET | Freq: Every day | ORAL | Status: DC
  Administered 2021-07-22 – 2021-07-24 (×3): 40 mg via ORAL
  Filled 2021-07-22 (×4): qty 2

## 2021-07-22 MED ORDER — SENNA 8.6 MG PO TABS
1.0000 | ORAL_TABLET | Freq: Every day | ORAL | Status: DC
  Administered 2021-07-22 – 2021-07-27 (×6): 8.6 mg via ORAL
  Filled 2021-07-22 (×6): qty 1

## 2021-07-22 MED ORDER — LEVOTHYROXINE SODIUM 25 MCG PO TABS
25.0000 ug | ORAL_TABLET | Freq: Every day | ORAL | Status: DC
  Administered 2021-07-23 – 2021-07-28 (×6): 25 ug via ORAL
  Filled 2021-07-22 (×6): qty 1

## 2021-07-22 MED ORDER — PANTOPRAZOLE SODIUM 40 MG PO TBEC
40.0000 mg | DELAYED_RELEASE_TABLET | Freq: Every day | ORAL | Status: DC
  Administered 2021-07-22 – 2021-07-28 (×7): 40 mg via ORAL
  Filled 2021-07-22 (×7): qty 1

## 2021-07-22 MED ORDER — LISINOPRIL 5 MG PO TABS
5.0000 mg | ORAL_TABLET | Freq: Every day | ORAL | Status: DC
  Administered 2021-07-22 – 2021-07-28 (×7): 5 mg via ORAL
  Filled 2021-07-22 (×7): qty 1

## 2021-07-22 NOTE — Consult Note (Signed)
Orthopaedic Trauma Service (OTS) Consult   Patient ID: Lisa Mills MRN: [id number] DOB/AGE: 86-Apr-1931 86 y.o.  Reason for Consult: Left femur fracture Referring Physician: Dr. Milagros Evener, MD  HPI: Lisa Mills is an 88 y.o. female with a history of multiple medical problems including diabetes, gastroesophageal reflux disease, hypertension, hypothyroidism, hyperlipidemia, chronic kidney disease, restless leg syndrome, and anxiety/depression being seen in consultation for left femur fracture. Patient was admitted to the hospital 6/24 through 07/15/21 following a fall. She had a femoral shaft fracture which was repaired by Dr. Roland Rack on 07/13/21.  She was discharged to a skilled nursing facility. Patient had been doing well with her PT at her rehab facility but a few days ago her left leg gave out. Since then she has been having decreased range of motion and increased pain in her left leg.  She has had difficulty bearing weight and was told by Dr. Roland Rack to come to [facility] emergency department for further evaluation.  Imaging in the emergency department showed new fracture of the distal femur and migration of the current intramedullary nail.  Orthopedics was consulted for evaluation and management.    Patient seen this morning on 5N.  Notes continued pain in her left thigh and knee.  Pain is manageable at rest but increases significantly with any palpation or movement of the leg. She denies any numbness or tingling distally.  Prior to initial injury, patient ambulated with no assistive device.  History of diabetes which is well controlled with diet.  Patient lived independently prior to fall.  Past Medical History:  Diagnosis Date   Anemia    Anxiety    Chronic kidney disease    stage 3-4   Constipation    Depression    Dermatophytosis of nail    Diabetes mellitus without complication (HCC)    Diet controlled, lost weight, no meds   GERD (gastroesophageal reflux disease)    Hemorrhoids     History of shingles    HLD (hyperlipidemia)    Hypertension    Hypothyroidism    Keratoderma    Restless legs syndrome (RLS)    hx    Past Surgical History:  Procedure Laterality Date   BREAST BIOPSY Right 09/30/2017   Affirm bx-calcs ( X clip),benign   BREAST EXCISIONAL BIOPSY Right    neg   cataracts     COLONOSCOPY W/ POLYPECTOMY     COLONOSCOPY WITH PROPOFOL N/A 02/10/2017   Procedure: COLONOSCOPY WITH PROPOFOL;  Surgeon: Manya Silvas, MD;  Location: [facility] ENDOSCOPY;  Service: Endoscopy;  Laterality: N/A;   ESOPHAGOGASTRODUODENOSCOPY (EGD) WITH PROPOFOL N/A 02/10/2017   Procedure: ESOPHAGOGASTRODUODENOSCOPY (EGD) WITH PROPOFOL;  Surgeon: Manya Silvas, MD;  Location: [facility] ENDOSCOPY;  Service: Endoscopy;  Laterality: N/A;   EYE SURGERY     bilateral cataracts   HAND SURGERY     INTRAMEDULLARY (IM) NAIL INTERTROCHANTERIC Left 07/13/2021   Procedure: INTRAMEDULLARY (IM) NAIL INTERTROCHANTRIC;  Surgeon: Corky Mull, MD;  Location: [facility] ORS;  Service: Orthopedics;  Laterality: Left;   KIDNEY STONE SURGERY     ORIF HUMERUS FRACTURE Right 02/13/2020   Procedure: OPEN REDUCTION INTERNAL FIXATION (ORIF) SUPRACONDYLAR  HUMERUS FRACTURE;  Surgeon: Altamese Anzac [place], MD;  Location: [facility];  Service: Orthopedics;  Laterality: Right;   SACROPLASTY N/A 07/06/2019   Procedure: SACROPLASTY;  Surgeon: Hessie Knows, MD;  Location: [facility] ORS;  Service: Orthopedics;  Laterality: N/A;   TONSILECTOMY, ADENOIDECTOMY, BILATERAL MYRINGOTOMY AND TUBES     TONSILLECTOMY  TRIGGER FINGER RELEASE     Left ring finger   tubercular peritonitis      Family History  Adopted: Yes  Problem Relation Age of Onset   Breast cancer Neg Hx     Social History:  reports that she has never smoked. She has never used smokeless tobacco. She reports that she does not currently use alcohol. She reports that she does not use drugs.  Allergies:  Allergies  Allergen Reactions   Aspirin Other (See Comments)     Burns stomach   Conray [Iothalamate] Hives    IV dye conray-400   Dye Fdc Red [Red Dye] Hives   Sulfa Antibiotics     Unknown Reaction, not used in years   Sulfasalazine     Other reaction(s): Unknown   Prednisone     Indigestion     Medications: I have reviewed the patient's current medications. Prior to Admission:  Medications Prior to Admission  Medication Sig Dispense Refill Last Dose   Blood Glucose Monitoring Suppl (ONE TOUCH ULTRA 2) w/Device KIT Use to check blood sugar as advised up to 2 times daily 1 kit 0    Cephalexin 250 MG tablet Take 1 tablet (250 mg total) by mouth daily in the afternoon.      Cholecalciferol (VITAMIN D) 50 MCG (2000 UT) CAPS Take 2,000 Units by mouth daily.       enoxaparin (LOVENOX) 40 MG/0.4ML injection Inject 0.4 mLs (40 mg total) into the skin daily. 0 mL     escitalopram (LEXAPRO) 10 MG tablet Take 1 tablet (10 mg total) by mouth daily. with food 90 tablet 3    ferrous sulfate 325 (65 FE) MG tablet Take 325 mg by mouth daily.       glucose blood (ONE TOUCH ULTRA TEST) test strip CHECK BLOOD SUGAR UP TO 2 TIMES A DAY. 200 each 11    glycerin adult 2 g suppository Place 1 suppository rectally as needed for constipation.      Histamine Dihydrochloride (AUSTRALIAN DREAM ARTHRITIS) 0.025 % CREA Apply 1 application topically daily as needed (Pain).      HYDROcodone-acetaminophen (NORCO/VICODIN) 5-325 MG tablet Take 1 tablet by mouth every 4 (four) hours as needed for moderate pain (pain score 4-6). 30 tablet 0    hydrocortisone (ANUSOL-HC) 2.5 % rectal cream USE 1 APPLICATION RECTALLY TWICE DAILY AS DIRECTED (Patient taking differently: Place 1 application  rectally daily as needed for hemorrhoids or anal itching.) 30 g 0    levothyroxine (SYNTHROID) 25 MCG tablet Take 1 tablet (25 mcg total) by mouth daily before breakfast. 90 tablet 3    lisinopril (ZESTRIL) 5 MG tablet TAKE 1 TABLET BY MOUTH ONCE DAILY 90 tablet 0    omeprazole (PRILOSEC) 20 MG capsule  TAKE 1 CAPSULE BY MOUTH ONCE DAILY 90 capsule 2    OneTouch Delica Lancets 83M MISC Use to check blood sugar up to 2 times daily 200 each 12    polyethylene glycol (MIRALAX / GLYCOLAX) 17 g packet Take 17 g by mouth daily as needed for moderate constipation or severe constipation. (Patient taking differently: Take 17 g by mouth daily.) 14 each 0    senna (SENOKOT) 8.6 MG TABS tablet Take 1 tablet (8.6 mg total) by mouth daily. 120 tablet 0    simvastatin (ZOCOR) 40 MG tablet TAKE 1 TABLET BY MOUTH AT BEDTIME 90 tablet 3    sodium chloride 1 g tablet Take 1 tablet (1 g total) by mouth 2 (two) times  daily with a meal.      witch hazel-glycerin (TUCKS) pad Apply 1 application topically as needed for itching.       ROS: Constitutional: No fever or chills Vision: No changes in vision ENT: No difficulty swallowing CV: No chest pain Pulm: No SOB or wheezing GI: No nausea or vomiting GU: No urgency or inability to hold urine Skin: + Bruising left thigh. + Surgical incision Neurologic: No numbness or tingling Psychiatric: No depression or anxiety Heme: No bruising Allergic: No reaction to medications or food   Exam: Blood pressure (!) 160/67, pulse 82, temperature 98.6 F (37 C), temperature source Oral, resp. rate 16, SpO2 97 %. General: Laying in bed comfortably.  No acute distress Orientation: Alert and oriented x3 Mood and Affect: Mood and affect appropriate, pleasant and cooperative Gait: Did not assess due to known injury Coordination and balance: Within normal limits  Left lower extremity: Mepilex dressing in place over lateral thigh.  Swelling throughout extremity.+ Pedal edema.  Notable bruising throughout the medial and posterior aspect of the side.  Holds leg in a slightly flexed position.  Did not attempt to get full knee extension.  Ankle DF/PF intact.  Endorses sensation throughout extremity.  Foot warm and well-perfused.  Right lower extremity skin without lesions. No  tenderness to palpation. Full painless ROM, full strength in each muscle group without evidence of instability.  Motor and sensory function intact.  Neurovascularly intact   Medical Decision Making: Data: Imaging: AP and lateral views of the left femur show oblique fracture through the mid diaphysis.  Medial displacement of distal fracture fragment.  New fracture noted in the distal portion of the femur.  Displacement of the intramedullary rod and obvious loosening of screws.  Labs:  Results for orders placed or performed during the hospital encounter of 07/21/21 (from the past 24 hour(s))  CBC     Status: Abnormal   Collection Time: 07/21/21  3:40 PM  Result Value Ref Range   WBC 11.1 (H) 4.0 - 10.5 K/uL   RBC 2.78 (L) 3.87 - 5.11 MIL/uL   Hemoglobin 8.5 (L) 12.0 - 15.0 g/dL   HCT 26.0 (L) 36.0 - 46.0 %   MCV 93.5 80.0 - 100.0 fL   MCH 30.6 26.0 - 34.0 pg   MCHC 32.7 30.0 - 36.0 g/dL   RDW 13.1 11.5 - 15.5 %   Platelets 519 (H) 150 - 400 K/uL   nRBC 0.0 0.0 - 0.2 %  Basic metabolic panel     Status: Abnormal   Collection Time: 07/21/21  3:40 PM  Result Value Ref Range   Sodium 127 (L) 135 - 145 mmol/L   Potassium 4.6 3.5 - 5.1 mmol/L   Chloride 92 (L) 98 - 111 mmol/L   CO2 24 22 - 32 mmol/L   Glucose, Bld 159 (H) 70 - 99 mg/dL   BUN 20 8 - 23 mg/dL   Creatinine, Ser 1.02 (H) 0.44 - 1.00 mg/dL   Calcium 9.2 8.9 - 10.3 mg/dL   GFR, Estimated 53 (L) >60 mL/min   Anion gap 11 5 - 15  Glucose, capillary     Status: Abnormal   Collection Time: 07/22/21 12:41 AM  Result Value Ref Range   Glucose-Capillary 179 (H) 70 - 99 mg/dL  Type and screen [place]     Status: None   Collection Time: 07/22/21  1:54 AM  Result Value Ref Range   ABO/RH(D) AB POS    Antibody Screen  NEG    Sample Expiration      07/25/2021,2359 Performed at [facility], [place] [address] [place], [place] [address]   CBC     Status: Abnormal   Collection Time: 07/22/21  1:54 AM   Result Value Ref Range   WBC 7.6 4.0 - 10.5 K/uL   RBC 2.38 (L) 3.87 - 5.11 MIL/uL   Hemoglobin 7.5 (L) 12.0 - 15.0 g/dL   HCT 21.9 (L) 36.0 - 46.0 %   MCV 92.0 80.0 - 100.0 fL   MCH 31.5 26.0 - 34.0 pg   MCHC 34.2 30.0 - 36.0 g/dL   RDW 13.2 11.5 - 15.5 %   Platelets 451 (H) 150 - 400 K/uL   nRBC 0.0 0.0 - 0.2 %  Basic metabolic panel     Status: Abnormal   Collection Time: 07/22/21  1:54 AM  Result Value Ref Range   Sodium 130 (L) 135 - 145 mmol/L   Potassium 4.2 3.5 - 5.1 mmol/L   Chloride 95 (L) 98 - 111 mmol/L   CO2 26 22 - 32 mmol/L   Glucose, Bld 135 (H) 70 - 99 mg/dL   BUN 22 8 - 23 mg/dL   Creatinine, Ser 0.96 0.44 - 1.00 mg/dL   Calcium 8.9 8.9 - 10.3 mg/dL   GFR, Estimated 57 (L) >60 mL/min   Anion gap 9 5 - 15  Glucose, capillary     Status: Abnormal   Collection Time: 07/22/21  4:34 AM  Result Value Ref Range   Glucose-Capillary 124 (H) 70 - 99 mg/dL     Assessment/Plan: 88 year old female s/p IMN left femur by Dr. Roland Rack 07/13/21 with failed fixation.  New fracture of the distal femur noted on imaging.  Previous intramedullary nail has moved and there is obvious loosening of the screws.  This will require surgical intervention.  Recommend proceeding with open reduction internal fixation of the left distal femur to provide stability to the left leg.  Risks and benefits of procedure were discussed with the patient.  She agrees to proceed.  Due to limited OR availability today because of the holiday, we will plan to proceed with surgery tomorrow morning (07/23/2021).  Patient okay to have diet today.  Will be n.p.o. effective midnight.  Gwinda Passe PA-C [facility] [phone] (office) [email or web address]

## 2021-07-22 NOTE — Hospital Course (Addendum)
88 y.o. female with medical history significant for diet-controlled diabetes mellitus, hypothyroidism, hypertension, recurrent UTIs, CKD 3A, chronic anemia, chronic hyponatremia, and left femoral shaft fracture status post reduction and internal fixation on 07/13/2021, now presenting from her SNF for evaluation of left leg pain.  After the recent hospitalization for surgical repair of the left femoral shaft fracture, patient went to acute rehab facility where she reports progressing quite well until few days ago when she was working with PT, shifted her weight from the right foot to the left, and felt her leg "give out."  Since then, she has had reduced range of motion, pain with movement, and difficulty bearing weight.  She had radiographs performed at the facility and was sent to the ED for further evaluation and management.  She denies any chest pain, shortness of breath, cough, fever, or chills.   In the ED, pt was found to be afebrile and saturating well on room air with stable blood pressure.  Chemistry panel with stable hyponatremia and renal insufficiency.  CBC was stable normocytic anemia and new thrombocytosis.  Radiographs of the left femur demonstrate new fracture of the distal femur fracture fragment with dislocation of the intramedullary rod and loosening of screws

## 2021-07-22 NOTE — Progress Notes (Signed)
Initial Nutrition Assessment  DOCUMENTATION CODES:   Non-severe (moderate) malnutrition in context of chronic illness  INTERVENTION:  Encourage adequate PO intake Ensure Enlive po BID, each supplement provides 350 kcal and 20 grams of protein. MVI with minerals daily Request updated wt   NUTRITION DIAGNOSIS:   Moderate Malnutrition related to chronic illness as evidenced by mild fat depletion, moderate muscle depletion, severe muscle depletion.  GOAL:   Patient will meet greater than or equal to 90% of their needs  MONITOR:   PO intake, Supplement acceptance, Labs, Weight trends  REASON FOR ASSESSMENT:   Consult Assessment of nutrition requirement/status, Hip fracture protocol  ASSESSMENT:   Pt admitted from SNF with L leg pain after falling while working with PT. Pt with recent L femoral shaft fracture s/p reduction and internal fixation on 06/25 and d/c to acute rehab. PMH significant for diet-controlled DM, hypothyroidism, HTN, recurrent UTI's, CKD IIIa, chronic anemia and chronic hyponatremia.  Per Ortho, imaging shows new fracture of distal femur and migration of the current intramedullary nail. Plans for ORIF tomorrow.   Pt sitting in bed with son at bedside. She has been at [facility] since her last admission. During her stay there she reports eating </=50% of the meals there as they have been the same menu items at each meal and have been modifying her foods. She denies difficulty chewing or swallowing. Her son has also been providing outside foods for her to help increase her intake. She tries to continue managing her diabetes via diet and is concerned about the potential for needing insulin. She states that she was ordered Ensure during last admission but had not had a chance to try one prior to d/c. Provided Ensure for pt to try at time of assessment and made RN aware as pt is on fluid restriction. She enjoys it but reports that it tastes really sweet. Suggested  that she could mix it with milk of her choice to cut down on the sweetness.   No documented meal completions to review.  Pt states that she has had additional wt loss within the last week but did not quantify how much. Reviewed wt history. Last documented wt was 06/25 49.9 kg which is stable within the last year. Will request updated wt to assess for recent wt loss.   Medications: SSI 0-6 units Q4h  Labs: sodium 130, GFR 57, CBG's 124-179 x24 hours, HgbA1c 7.5% (02/24)  NUTRITION - FOCUSED PHYSICAL EXAM:  Flowsheet Row Most Recent Value  Orbital Region Mild depletion  Upper Arm Region Severe depletion  Thoracic and Lumbar Region Mild depletion  Buccal Region Mild depletion  Temple Region Moderate depletion  Clavicle Bone Region Moderate depletion  Clavicle and Acromion Bone Region Mild depletion  Scapular Bone Region Moderate depletion  Dorsal Hand Moderate depletion  Patellar Region Severe depletion  Anterior Thigh Region Severe depletion  Posterior Calf Region Moderate depletion  Edema (RD Assessment) None  Hair Reviewed  Eyes Reviewed  Mouth Reviewed  Skin Reviewed  Nails Reviewed       Diet Order:   Diet Order             Diet NPO time specified Except for: Ice Chips  Diet effective midnight           Diet regular Room service appropriate? Yes; Fluid consistency: Thin; Fluid restriction: 1500 mL Fluid  Diet effective now  EDUCATION NEEDS:   Education needs have been addressed  Skin:  Skin Assessment: Reviewed RN Assessment  Last BM:  7/3 (type 4)  Height:   Ht Readings from Last 1 Encounters:  07/13/21 '4\' 11"'$  (1.499 m)    Weight:   Wt Readings from Last 1 Encounters:  07/13/21 49.9 kg   BMI:  There is no height or weight on file to calculate BMI.  Estimated Nutritional Needs:   Kcal:  1300-1500  Protein:  65-80g  Fluid:  >/=1.5L  Clayborne Dana, RDN, LDN Clinical Nutrition

## 2021-07-22 NOTE — Plan of Care (Signed)

## 2021-07-22 NOTE — Progress Notes (Signed)
  Progress Note   Patient: Lisa Mills QBH:[id number] DOB: [birth year] DOA: 07/21/2021     1 DOS: the patient was seen and examined on 07/22/2021   Brief hospital course: 88 y.o. female with medical history significant for diet-controlled diabetes mellitus, hypothyroidism, hypertension, recurrent UTIs, CKD 3A, chronic anemia, chronic hyponatremia, and left femoral shaft fracture status post reduction and internal fixation on 07/13/2021, now presenting from her SNF for evaluation of left leg pain.  After the recent hospitalization for surgical repair of the left femoral shaft fracture, patient went to acute rehab facility where she reports progressing quite well until few days ago when she was working with PT, shifted her weight from the right foot to the left, and felt her leg "give out."  Since then, she has had reduced range of motion, pain with movement, and difficulty bearing weight.  She had radiographs performed at the facility and was sent to the ED for further evaluation and management.  She denies any chest pain, shortness of breath, cough, fever, or chills.   In the ED, pt was found to be afebrile and saturating well on room air with stable blood pressure.  Chemistry panel with stable hyponatremia and renal insufficiency.  CBC was stable normocytic anemia and new thrombocytosis.  Radiographs of the left femur demonstrate new fracture of the distal femur fracture fragment with dislocation of the intramedullary rod and loosening of screws  Assessment and Plan: 1. Periprosthetic left femoral shaft fracture  - S/p ORIF on 6/25 now presenting with pain and deformity after feeling the leg "give out" a few days ago PTA - Plain radiographs demonstrate new fracture involving distal fracture fragment with displaced IM rod and loosened screws  - Orthopedic Surgery consulted, plans for surgery 7/5 - Cont with analgesia as needed   2. Type II DM  - A1c was 7.5% in February 2023  - cont SSI as needed    3. Hyponatremia   - Chronic, appears stable, attributed to SIADH and recently started on salt tabs   - Cont fluid restricted diet   4. CKD IIIa  - Appears stable  - Renally-dose medications    5. Hypothyroidism  - On Synthroid     6. Hypertension  - Hold ACE-i perioperatively, treat as-needed only for now  -BP stable and controlled at this time       Subjective: Complains of L hip pain on movement  Physical Exam: Vitals:   07/22/21 0043 07/22/21 0433 07/22/21 0859 07/22/21 1430  BP: 129/60 (!) 160/67 (!) 148/61 127/67  Pulse: 82 82 88 87  Resp:  16 16   Temp:  98.6 F (37 C) 98.4 F (36.9 C)   TempSrc:  Oral Oral   SpO2:  97% 96% 96%   General exam: Awake, laying in bed, in nad Respiratory system: Normal respiratory effort, no wheezing Cardiovascular system: regular rate, s1, s2 Gastrointestinal system: Soft, nondistended, positive BS Central nervous system: CN2-12 grossly intact, strength intact Extremities: Perfused, no clubbing Skin: Normal skin turgor, no notable skin lesions seen Psychiatry: Mood normal // no visual hallucinations   Data Reviewed:  Labs reviewed: Na 130, Cr 0.96, Hgb 7.5  Family Communication: Pt in room, family not at bedside  Disposition: Status is: Inpatient Remains inpatient appropriate because: Severity of illness  Planned Discharge Destination:  Unknown, pending PT/OT eval post-op     Author: Marylu Lund, MD 07/22/2021 4:46 PM  For on call review [email or web address].

## 2021-07-22 NOTE — Plan of Care (Signed)

## 2021-07-22 NOTE — Plan of Care (Signed)
  Problem: Education: Goal: Knowledge of General Education information will improve Description: Including pain rating scale, medication(s)/side effects and non-pharmacologic comfort measures Outcome: Progressing   Problem: Clinical Measurements: Goal: Will remain free from infection Outcome: Progressing   Problem: Activity: Goal: Risk for activity intolerance will decrease Outcome: Progressing   

## 2021-07-22 NOTE — H&P (View-Only) (Signed)
Orthopaedic Trauma Service (OTS) Consult   Patient ID: Lisa Mills MRN: [id number] DOB/AGE: 86-Apr-1931 86 y.o.  Reason for Consult: Left femur fracture Referring Physician: Dr. Milagros Evener, MD  HPI: Lisa Mills is an 88 y.o. female with a history of multiple medical problems including diabetes, gastroesophageal reflux disease, hypertension, hypothyroidism, hyperlipidemia, chronic kidney disease, restless leg syndrome, and anxiety/depression being seen in consultation for left femur fracture. Patient was admitted to the hospital 6/24 through 07/15/21 following a fall. She had a femoral shaft fracture which was repaired by Dr. Roland Rack on 07/13/21.  She was discharged to a skilled nursing facility. Patient had been doing well with her PT at her rehab facility but a few days ago her left leg gave out. Since then she has been having decreased range of motion and increased pain in her left leg.  She has had difficulty bearing weight and was told by Dr. Roland Rack to come to [facility] emergency department for further evaluation.  Imaging in the emergency department showed new fracture of the distal femur and migration of the current intramedullary nail.  Orthopedics was consulted for evaluation and management.    Patient seen this morning on 5N.  Notes continued pain in her left thigh and knee.  Pain is manageable at rest but increases significantly with any palpation or movement of the leg. She denies any numbness or tingling distally.  Prior to initial injury, patient ambulated with no assistive device.  History of diabetes which is well controlled with diet.  Patient lived independently prior to fall.  Past Medical History:  Diagnosis Date   Anemia    Anxiety    Chronic kidney disease    stage 3-4   Constipation    Depression    Dermatophytosis of nail    Diabetes mellitus without complication (HCC)    Diet controlled, lost weight, no meds   GERD (gastroesophageal reflux disease)    Hemorrhoids     History of shingles    HLD (hyperlipidemia)    Hypertension    Hypothyroidism    Keratoderma    Restless legs syndrome (RLS)    hx    Past Surgical History:  Procedure Laterality Date   BREAST BIOPSY Right 09/30/2017   Affirm bx-calcs ( X clip),benign   BREAST EXCISIONAL BIOPSY Right    neg   cataracts     COLONOSCOPY W/ POLYPECTOMY     COLONOSCOPY WITH PROPOFOL N/A 02/10/2017   Procedure: COLONOSCOPY WITH PROPOFOL;  Surgeon: Manya Silvas, MD;  Location: [facility] ENDOSCOPY;  Service: Endoscopy;  Laterality: N/A;   ESOPHAGOGASTRODUODENOSCOPY (EGD) WITH PROPOFOL N/A 02/10/2017   Procedure: ESOPHAGOGASTRODUODENOSCOPY (EGD) WITH PROPOFOL;  Surgeon: Manya Silvas, MD;  Location: [facility] ENDOSCOPY;  Service: Endoscopy;  Laterality: N/A;   EYE SURGERY     bilateral cataracts   HAND SURGERY     INTRAMEDULLARY (IM) NAIL INTERTROCHANTERIC Left 07/13/2021   Procedure: INTRAMEDULLARY (IM) NAIL INTERTROCHANTRIC;  Surgeon: Corky Mull, MD;  Location: [facility] ORS;  Service: Orthopedics;  Laterality: Left;   KIDNEY STONE SURGERY     ORIF HUMERUS FRACTURE Right 02/13/2020   Procedure: OPEN REDUCTION INTERNAL FIXATION (ORIF) SUPRACONDYLAR  HUMERUS FRACTURE;  Surgeon: Altamese Anzac [place], MD;  Location: [facility];  Service: Orthopedics;  Laterality: Right;   SACROPLASTY N/A 07/06/2019   Procedure: SACROPLASTY;  Surgeon: Hessie Knows, MD;  Location: [facility] ORS;  Service: Orthopedics;  Laterality: N/A;   TONSILECTOMY, ADENOIDECTOMY, BILATERAL MYRINGOTOMY AND TUBES     TONSILLECTOMY  TRIGGER FINGER RELEASE     Left ring finger   tubercular peritonitis      Family History  Adopted: Yes  Problem Relation Age of Onset   Breast cancer Neg Hx     Social History:  reports that she has never smoked. She has never used smokeless tobacco. She reports that she does not currently use alcohol. She reports that she does not use drugs.  Allergies:  Allergies  Allergen Reactions   Aspirin Other (See Comments)     Burns stomach   Conray [Iothalamate] Hives    IV dye conray-400   Dye Fdc Red [Red Dye] Hives   Sulfa Antibiotics     Unknown Reaction, not used in years   Sulfasalazine     Other reaction(s): Unknown   Prednisone     Indigestion     Medications: I have reviewed the patient's current medications. Prior to Admission:  Medications Prior to Admission  Medication Sig Dispense Refill Last Dose   Blood Glucose Monitoring Suppl (ONE TOUCH ULTRA 2) w/Device KIT Use to check blood sugar as advised up to 2 times daily 1 kit 0    Cephalexin 250 MG tablet Take 1 tablet (250 mg total) by mouth daily in the afternoon.      Cholecalciferol (VITAMIN D) 50 MCG (2000 UT) CAPS Take 2,000 Units by mouth daily.       enoxaparin (LOVENOX) 40 MG/0.4ML injection Inject 0.4 mLs (40 mg total) into the skin daily. 0 mL     escitalopram (LEXAPRO) 10 MG tablet Take 1 tablet (10 mg total) by mouth daily. with food 90 tablet 3    ferrous sulfate 325 (65 FE) MG tablet Take 325 mg by mouth daily.       glucose blood (ONE TOUCH ULTRA TEST) test strip CHECK BLOOD SUGAR UP TO 2 TIMES A DAY. 200 each 11    glycerin adult 2 g suppository Place 1 suppository rectally as needed for constipation.      Histamine Dihydrochloride (AUSTRALIAN DREAM ARTHRITIS) 0.025 % CREA Apply 1 application topically daily as needed (Pain).      HYDROcodone-acetaminophen (NORCO/VICODIN) 5-325 MG tablet Take 1 tablet by mouth every 4 (four) hours as needed for moderate pain (pain score 4-6). 30 tablet 0    hydrocortisone (ANUSOL-HC) 2.5 % rectal cream USE 1 APPLICATION RECTALLY TWICE DAILY AS DIRECTED (Patient taking differently: Place 1 application  rectally daily as needed for hemorrhoids or anal itching.) 30 g 0    levothyroxine (SYNTHROID) 25 MCG tablet Take 1 tablet (25 mcg total) by mouth daily before breakfast. 90 tablet 3    lisinopril (ZESTRIL) 5 MG tablet TAKE 1 TABLET BY MOUTH ONCE DAILY 90 tablet 0    omeprazole (PRILOSEC) 20 MG capsule  TAKE 1 CAPSULE BY MOUTH ONCE DAILY 90 capsule 2    OneTouch Delica Lancets 83M MISC Use to check blood sugar up to 2 times daily 200 each 12    polyethylene glycol (MIRALAX / GLYCOLAX) 17 g packet Take 17 g by mouth daily as needed for moderate constipation or severe constipation. (Patient taking differently: Take 17 g by mouth daily.) 14 each 0    senna (SENOKOT) 8.6 MG TABS tablet Take 1 tablet (8.6 mg total) by mouth daily. 120 tablet 0    simvastatin (ZOCOR) 40 MG tablet TAKE 1 TABLET BY MOUTH AT BEDTIME 90 tablet 3    sodium chloride 1 g tablet Take 1 tablet (1 g total) by mouth 2 (two) times  daily with a meal.      witch hazel-glycerin (TUCKS) pad Apply 1 application topically as needed for itching.       ROS: Constitutional: No fever or chills Vision: No changes in vision ENT: No difficulty swallowing CV: No chest pain Pulm: No SOB or wheezing GI: No nausea or vomiting GU: No urgency or inability to hold urine Skin: + Bruising left thigh. + Surgical incision Neurologic: No numbness or tingling Psychiatric: No depression or anxiety Heme: No bruising Allergic: No reaction to medications or food   Exam: Blood pressure (!) 160/67, pulse 82, temperature 98.6 F (37 C), temperature source Oral, resp. rate 16, SpO2 97 %. General: Laying in bed comfortably.  No acute distress Orientation: Alert and oriented x3 Mood and Affect: Mood and affect appropriate, pleasant and cooperative Gait: Did not assess due to known injury Coordination and balance: Within normal limits  Left lower extremity: Mepilex dressing in place over lateral thigh.  Swelling throughout extremity.+ Pedal edema.  Notable bruising throughout the medial and posterior aspect of the side.  Holds leg in a slightly flexed position.  Did not attempt to get full knee extension.  Ankle DF/PF intact.  Endorses sensation throughout extremity.  Foot warm and well-perfused.  Right lower extremity skin without lesions. No  tenderness to palpation. Full painless ROM, full strength in each muscle group without evidence of instability.  Motor and sensory function intact.  Neurovascularly intact   Medical Decision Making: Data: Imaging: AP and lateral views of the left femur show oblique fracture through the mid diaphysis.  Medial displacement of distal fracture fragment.  New fracture noted in the distal portion of the femur.  Displacement of the intramedullary rod and obvious loosening of screws.  Labs:  Results for orders placed or performed during the hospital encounter of 07/21/21 (from the past 24 hour(s))  CBC     Status: Abnormal   Collection Time: 07/21/21  3:40 PM  Result Value Ref Range   WBC 11.1 (H) 4.0 - 10.5 K/uL   RBC 2.78 (L) 3.87 - 5.11 MIL/uL   Hemoglobin 8.5 (L) 12.0 - 15.0 g/dL   HCT 26.0 (L) 36.0 - 46.0 %   MCV 93.5 80.0 - 100.0 fL   MCH 30.6 26.0 - 34.0 pg   MCHC 32.7 30.0 - 36.0 g/dL   RDW 13.1 11.5 - 15.5 %   Platelets 519 (H) 150 - 400 K/uL   nRBC 0.0 0.0 - 0.2 %  Basic metabolic panel     Status: Abnormal   Collection Time: 07/21/21  3:40 PM  Result Value Ref Range   Sodium 127 (L) 135 - 145 mmol/L   Potassium 4.6 3.5 - 5.1 mmol/L   Chloride 92 (L) 98 - 111 mmol/L   CO2 24 22 - 32 mmol/L   Glucose, Bld 159 (H) 70 - 99 mg/dL   BUN 20 8 - 23 mg/dL   Creatinine, Ser 1.02 (H) 0.44 - 1.00 mg/dL   Calcium 9.2 8.9 - 10.3 mg/dL   GFR, Estimated 53 (L) >60 mL/min   Anion gap 11 5 - 15  Glucose, capillary     Status: Abnormal   Collection Time: 07/22/21 12:41 AM  Result Value Ref Range   Glucose-Capillary 179 (H) 70 - 99 mg/dL  Type and screen [place]     Status: None   Collection Time: 07/22/21  1:54 AM  Result Value Ref Range   ABO/RH(D) AB POS    Antibody Screen  NEG    Sample Expiration      07/25/2021,2359 Performed at [facility], [place] [address] [place], [place] [address]   CBC     Status: Abnormal   Collection Time: 07/22/21  1:54 AM   Result Value Ref Range   WBC 7.6 4.0 - 10.5 K/uL   RBC 2.38 (L) 3.87 - 5.11 MIL/uL   Hemoglobin 7.5 (L) 12.0 - 15.0 g/dL   HCT 21.9 (L) 36.0 - 46.0 %   MCV 92.0 80.0 - 100.0 fL   MCH 31.5 26.0 - 34.0 pg   MCHC 34.2 30.0 - 36.0 g/dL   RDW 13.2 11.5 - 15.5 %   Platelets 451 (H) 150 - 400 K/uL   nRBC 0.0 0.0 - 0.2 %  Basic metabolic panel     Status: Abnormal   Collection Time: 07/22/21  1:54 AM  Result Value Ref Range   Sodium 130 (L) 135 - 145 mmol/L   Potassium 4.2 3.5 - 5.1 mmol/L   Chloride 95 (L) 98 - 111 mmol/L   CO2 26 22 - 32 mmol/L   Glucose, Bld 135 (H) 70 - 99 mg/dL   BUN 22 8 - 23 mg/dL   Creatinine, Ser 0.96 0.44 - 1.00 mg/dL   Calcium 8.9 8.9 - 10.3 mg/dL   GFR, Estimated 57 (L) >60 mL/min   Anion gap 9 5 - 15  Glucose, capillary     Status: Abnormal   Collection Time: 07/22/21  4:34 AM  Result Value Ref Range   Glucose-Capillary 124 (H) 70 - 99 mg/dL     Assessment/Plan: 88 year old female s/p IMN left femur by Dr. Roland Rack 07/13/21 with failed fixation.  New fracture of the distal femur noted on imaging.  Previous intramedullary nail has moved and there is obvious loosening of the screws.  This will require surgical intervention.  Recommend proceeding with open reduction internal fixation of the left distal femur to provide stability to the left leg.  Risks and benefits of procedure were discussed with the patient.  She agrees to proceed.  Due to limited OR availability today because of the holiday, we will plan to proceed with surgery tomorrow morning (07/23/2021).  Patient okay to have diet today.  Will be n.p.o. effective midnight.  Gwinda Passe PA-C [facility] [phone] (office) [email or web address]

## 2021-07-23 ENCOUNTER — Inpatient Hospital Stay (HOSPITAL_COMMUNITY): Payer: PPO

## 2021-07-23 ENCOUNTER — Inpatient Hospital Stay (HOSPITAL_COMMUNITY): Payer: PPO | Admitting: Anesthesiology

## 2021-07-23 ENCOUNTER — Encounter (HOSPITAL_COMMUNITY): Payer: Self-pay | Admitting: Student

## 2021-07-23 ENCOUNTER — Encounter (HOSPITAL_COMMUNITY)
Admission: EM | Disposition: A | Discharge status: Skilled Nursing Facility | Payer: Self-pay | Source: Skilled Nursing Facility | Attending: Family Medicine

## 2021-07-23 ENCOUNTER — Other Ambulatory Visit: Payer: Self-pay

## 2021-07-23 DIAGNOSIS — E1122 Type 2 diabetes mellitus with diabetic chronic kidney disease: Secondary | ICD-10-CM | POA: Diagnosis not present

## 2021-07-23 DIAGNOSIS — N189 Chronic kidney disease, unspecified: Secondary | ICD-10-CM | POA: Diagnosis not present

## 2021-07-23 DIAGNOSIS — M9701XA Periprosthetic fracture around internal prosthetic right hip joint, initial encounter: Secondary | ICD-10-CM | POA: Diagnosis not present

## 2021-07-23 DIAGNOSIS — Z9889 Other specified postprocedural states: Secondary | ICD-10-CM | POA: Diagnosis not present

## 2021-07-23 DIAGNOSIS — I129 Hypertensive chronic kidney disease with stage 1 through stage 4 chronic kidney disease, or unspecified chronic kidney disease: Secondary | ICD-10-CM | POA: Diagnosis not present

## 2021-07-23 DIAGNOSIS — S7292XA Unspecified fracture of left femur, initial encounter for closed fracture: Secondary | ICD-10-CM | POA: Diagnosis not present

## 2021-07-23 DIAGNOSIS — E871 Hypo-osmolality and hyponatremia: Secondary | ICD-10-CM | POA: Diagnosis not present

## 2021-07-23 DIAGNOSIS — E038 Other specified hypothyroidism: Secondary | ICD-10-CM | POA: Diagnosis not present

## 2021-07-23 DIAGNOSIS — Z8781 Personal history of (healed) traumatic fracture: Secondary | ICD-10-CM

## 2021-07-23 HISTORY — PX: HARDWARE REMOVAL: SHX979

## 2021-07-23 HISTORY — PX: ORIF FEMUR FRACTURE: SHX2119

## 2021-07-23 HISTORY — PX: INTRAMEDULLARY (IM) NAIL INTERTROCHANTERIC: SHX5875

## 2021-07-23 LAB — CBC
HCT: 21.5 % — ABNORMAL LOW (ref 36.0–46.0)
Hemoglobin: 7.5 g/dL — ABNORMAL LOW (ref 12.0–15.0)
MCH: 31.5 pg (ref 26.0–34.0)
MCHC: 34.9 g/dL (ref 30.0–36.0)
MCV: 90.3 fL (ref 80.0–100.0)
Platelets: 474 10*3/uL — ABNORMAL HIGH (ref 150–400)
RBC: 2.38 MIL/uL — ABNORMAL LOW (ref 3.87–5.11)
RDW: 13.2 % (ref 11.5–15.5)
WBC: 7.3 10*3/uL (ref 4.0–10.5)
nRBC: 0 % (ref 0.0–0.2)

## 2021-07-23 LAB — GLUCOSE, CAPILLARY
Glucose-Capillary: 130 mg/dL — ABNORMAL HIGH (ref 70–99)
Glucose-Capillary: 129 mg/dL — ABNORMAL HIGH (ref 70–99)
Glucose-Capillary: 176 mg/dL — ABNORMAL HIGH (ref 70–99)
Glucose-Capillary: 148 mg/dL — ABNORMAL HIGH (ref 70–99)
Glucose-Capillary: 152 mg/dL — ABNORMAL HIGH (ref 70–99)
Glucose-Capillary: 310 mg/dL — ABNORMAL HIGH (ref 70–99)
Glucose-Capillary: 215 mg/dL — ABNORMAL HIGH (ref 70–99)

## 2021-07-23 LAB — BASIC METABOLIC PANEL
Anion gap: 9 (ref 5–15)
BUN: 25 mg/dL — ABNORMAL HIGH (ref 8–23)
CO2: 25 mmol/L (ref 22–32)
Calcium: 8.7 mg/dL — ABNORMAL LOW (ref 8.9–10.3)
Chloride: 93 mmol/L — ABNORMAL LOW (ref 98–111)
Creatinine, Ser: 1.1 mg/dL — ABNORMAL HIGH (ref 0.44–1.00)
GFR, Estimated: 48 mL/min — ABNORMAL LOW (ref >60)
Glucose, Bld: 131 mg/dL — ABNORMAL HIGH (ref 70–99)
Potassium: 4.2 mmol/L (ref 3.5–5.1)
Sodium: 127 mmol/L — ABNORMAL LOW (ref 135–145)

## 2021-07-23 LAB — PREPARE RBC (CROSSMATCH)

## 2021-07-23 SURGERY — OPEN REDUCTION INTERNAL FIXATION (ORIF) DISTAL FEMUR FRACTURE
Anesthesia: Monitor Anesthesia Care | Laterality: Left

## 2021-07-23 MED ORDER — LACTATED RINGERS IV SOLN: INTRAVENOUS | Status: DC | PRN

## 2021-07-23 MED ORDER — ONDANSETRON HCL 4 MG/2ML IJ SOLN: 4.0000 mg | Freq: Four times a day (QID) | INTRAMUSCULAR | Status: DC | PRN

## 2021-07-23 MED ORDER — 0.9 % SODIUM CHLORIDE (POUR BTL) OPTIME
TOPICAL | Status: DC | PRN
  Administered 2021-07-23: 1000 mL

## 2021-07-23 MED ORDER — PROPOFOL 10 MG/ML IV BOLUS
INTRAVENOUS | Status: DC | PRN
  Administered 2021-07-23 (×2): 20 mg via INTRAVENOUS

## 2021-07-23 MED ORDER — PROPOFOL 10 MG/ML IV BOLUS
INTRAVENOUS | Status: AC
  Filled 2021-07-23: qty 20

## 2021-07-23 MED ORDER — ACETAMINOPHEN 10 MG/ML IV SOLN
1000.0000 mg | Freq: Once | INTRAVENOUS | Status: DC | PRN
Start: 2021-07-23 — End: 2021-07-23

## 2021-07-23 MED ORDER — SODIUM CHLORIDE 0.9% IV SOLUTION: Freq: Once | INTRAVENOUS | Status: DC

## 2021-07-23 MED ORDER — ONDANSETRON HCL 4 MG PO TABS: 4.0000 mg | ORAL_TABLET | Freq: Four times a day (QID) | ORAL | Status: DC | PRN

## 2021-07-23 MED ORDER — PHENYLEPHRINE 80 MCG/ML (10ML) SYRINGE FOR IV PUSH (FOR BLOOD PRESSURE SUPPORT)
PREFILLED_SYRINGE | INTRAVENOUS | Status: AC
  Filled 2021-07-23: qty 10

## 2021-07-23 MED ORDER — HYDRALAZINE HCL 20 MG/ML IJ SOLN
INTRAMUSCULAR | Status: AC
  Filled 2021-07-23: qty 1

## 2021-07-23 MED ORDER — PROPOFOL 500 MG/50ML IV EMUL
INTRAVENOUS | Status: DC | PRN
  Administered 2021-07-23: 30 ug/kg/min via INTRAVENOUS

## 2021-07-23 MED ORDER — INSULIN ASPART 100 UNIT/ML IJ SOLN
0.0000 [IU] | Freq: Three times a day (TID) | INTRAMUSCULAR | Status: DC
  Administered 2021-07-24: 2 [IU] via SUBCUTANEOUS

## 2021-07-23 MED ORDER — POLYETHYLENE GLYCOL 3350 17 G PO PACK
17.0000 g | PACK | Freq: Every day | ORAL | Status: DC | PRN
  Administered 2021-07-25: 17 g via ORAL
  Filled 2021-07-23: qty 1

## 2021-07-23 MED ORDER — LIDOCAINE 2% (20 MG/ML) 5 ML SYRINGE
INTRAMUSCULAR | Status: AC
  Filled 2021-07-23: qty 5

## 2021-07-23 MED ORDER — FENTANYL CITRATE (PF) 250 MCG/5ML IJ SOLN
INTRAMUSCULAR | Status: DC | PRN
  Administered 2021-07-23: 50 ug via INTRAVENOUS
  Administered 2021-07-23 (×4): 25 ug via INTRAVENOUS
  Administered 2021-07-23: 50 ug via INTRAVENOUS
  Administered 2021-07-23: 25 ug via INTRAVENOUS

## 2021-07-23 MED ORDER — ACETAMINOPHEN 325 MG PO TABS: 325.0000 mg | ORAL_TABLET | Freq: Four times a day (QID) | ORAL | Status: DC | PRN

## 2021-07-23 MED ORDER — HYDRALAZINE HCL 20 MG/ML IJ SOLN
5.0000 mg | Freq: Once | INTRAMUSCULAR | Status: AC
Start: 2021-07-23 — End: 2021-07-23
  Administered 2021-07-23: 5 mg via INTRAVENOUS

## 2021-07-23 MED ORDER — FENTANYL CITRATE (PF) 100 MCG/2ML IJ SOLN
25.0000 ug | INTRAMUSCULAR | Status: DC | PRN
  Administered 2021-07-23 (×4): 25 ug via INTRAVENOUS

## 2021-07-23 MED ORDER — CEFAZOLIN SODIUM-DEXTROSE 2-4 GM/100ML-% IV SOLN
2.0000 g | Freq: Two times a day (BID) | INTRAVENOUS | Status: AC
  Administered 2021-07-23 – 2021-07-24 (×2): 2 g via INTRAVENOUS
  Filled 2021-07-23 (×2): qty 100

## 2021-07-23 MED ORDER — CEFAZOLIN SODIUM-DEXTROSE 2-3 GM-%(50ML) IV SOLR
INTRAVENOUS | Status: DC | PRN
  Administered 2021-07-23: 2 g via INTRAVENOUS

## 2021-07-23 MED ORDER — HYDROCODONE-ACETAMINOPHEN 7.5-325 MG PO TABS: 1.0000 | ORAL_TABLET | ORAL | Status: DC | PRN

## 2021-07-23 MED ORDER — METOCLOPRAMIDE HCL 5 MG PO TABS: 5.0000 mg | ORAL_TABLET | Freq: Three times a day (TID) | ORAL | Status: DC | PRN

## 2021-07-23 MED ORDER — FENTANYL CITRATE (PF) 100 MCG/2ML IJ SOLN
INTRAMUSCULAR | Status: AC
  Filled 2021-07-23: qty 2

## 2021-07-23 MED ORDER — METOCLOPRAMIDE HCL 5 MG/ML IJ SOLN: 5.0000 mg | Freq: Three times a day (TID) | INTRAMUSCULAR | Status: DC | PRN

## 2021-07-23 MED ORDER — ACETAMINOPHEN 10 MG/ML IV SOLN
INTRAVENOUS | Status: DC | PRN
  Administered 2021-07-23: 1000 mg via INTRAVENOUS

## 2021-07-23 MED ORDER — ACETAMINOPHEN 10 MG/ML IV SOLN
INTRAVENOUS | Status: AC
  Filled 2021-07-23: qty 100

## 2021-07-23 MED ORDER — SODIUM CHLORIDE 0.9 % IV SOLN: INTRAVENOUS | Status: DC

## 2021-07-23 MED ORDER — BUPIVACAINE IN DEXTROSE 0.75-8.25 % IT SOLN
INTRATHECAL | Status: DC | PRN
  Administered 2021-07-23: 1.5 mL via INTRATHECAL

## 2021-07-23 MED ORDER — ENOXAPARIN SODIUM 40 MG/0.4ML IJ SOSY
40.0000 mg | PREFILLED_SYRINGE | INTRAMUSCULAR | Status: DC
  Administered 2021-07-24: 40 mg via SUBCUTANEOUS
  Filled 2021-07-23: qty 0.4

## 2021-07-23 MED ORDER — HYDROCODONE-ACETAMINOPHEN 5-325 MG PO TABS
1.0000 | ORAL_TABLET | ORAL | Status: DC | PRN
  Administered 2021-07-23: 2 via ORAL
  Administered 2021-07-23: 1 via ORAL
  Administered 2021-07-24: 2 via ORAL
  Filled 2021-07-23 (×2): qty 2
  Filled 2021-07-23: qty 1

## 2021-07-23 MED ORDER — DEXMEDETOMIDINE (PRECEDEX) IN NS 20 MCG/5ML (4 MCG/ML) IV SYRINGE
PREFILLED_SYRINGE | INTRAVENOUS | Status: DC | PRN
  Administered 2021-07-23 (×2): 4 ug via INTRAVENOUS

## 2021-07-23 MED ORDER — VANCOMYCIN HCL 1000 MG IV SOLR
INTRAVENOUS | Status: DC | PRN
  Administered 2021-07-23: 1000 mg

## 2021-07-23 MED ORDER — FENTANYL CITRATE (PF) 250 MCG/5ML IJ SOLN
INTRAMUSCULAR | Status: AC
  Filled 2021-07-23: qty 5

## 2021-07-23 MED ORDER — VANCOMYCIN HCL 1000 MG IV SOLR
INTRAVENOUS | Status: AC
Start: 2021-07-23 — End: ?
  Filled 2021-07-23: qty 20

## 2021-07-23 MED ORDER — PHENYLEPHRINE 80 MCG/ML (10ML) SYRINGE FOR IV PUSH (FOR BLOOD PRESSURE SUPPORT)
PREFILLED_SYRINGE | INTRAVENOUS | Status: DC | PRN
  Administered 2021-07-23: 160 ug via INTRAVENOUS
  Administered 2021-07-23: 240 ug via INTRAVENOUS

## 2021-07-23 MED ORDER — FENTANYL CITRATE PF 50 MCG/ML IJ SOSY
12.5000 ug | PREFILLED_SYRINGE | INTRAMUSCULAR | Status: DC | PRN
  Administered 2021-07-23: 25 ug via INTRAVENOUS
  Filled 2021-07-23: qty 1

## 2021-07-23 MED ORDER — PHENYLEPHRINE HCL-NACL 20-0.9 MG/250ML-% IV SOLN
INTRAVENOUS | Status: DC | PRN
  Administered 2021-07-23: 30 ug/min via INTRAVENOUS

## 2021-07-23 MED ORDER — DEXMEDETOMIDINE HCL IN NACL 80 MCG/20ML IV SOLN
INTRAVENOUS | Status: AC
  Filled 2021-07-23: qty 20

## 2021-07-23 SURGICAL SUPPLY — 73 items
BAG COUNTER SPONGE SURGICOUNT (BAG) ×2 IMPLANT
BIT DRILL 4.3 (BIT) ×2
BIT DRILL 4.3X300MM (BIT) IMPLANT
BIT DRILL LONG 3.3 (BIT) ×1 IMPLANT
BIT DRILL QC 3.3X195 (BIT) ×1 IMPLANT
BLADE CLIPPER SURG (BLADE) IMPLANT
BNDG COHESIVE 6X5 TAN STRL LF (GAUZE/BANDAGES/DRESSINGS) ×2 IMPLANT
BNDG ELASTIC 4X5.8 VLCR STR LF (GAUZE/BANDAGES/DRESSINGS) ×1 IMPLANT
BNDG ELASTIC 6X5.8 VLCR STR LF (GAUZE/BANDAGES/DRESSINGS) ×1 IMPLANT
BRUSH SCRUB EZ PLAIN DRY (MISCELLANEOUS) ×4 IMPLANT
CANISTER SUCT 3000ML PPV (MISCELLANEOUS) ×2 IMPLANT
CAP LOCK NCB (Cap) ×9 IMPLANT
CHLORAPREP W/TINT 26 (MISCELLANEOUS) ×2 IMPLANT
COVER SURGICAL LIGHT HANDLE (MISCELLANEOUS) ×2 IMPLANT
DERMABOND ADVANCED (GAUZE/BANDAGES/DRESSINGS) ×2
DERMABOND ADVANCED .7 DNX12 (GAUZE/BANDAGES/DRESSINGS) IMPLANT
DRAPE C-ARM 42X72 X-RAY (DRAPES) ×2 IMPLANT
DRAPE C-ARMOR (DRAPES) ×2 IMPLANT
DRAPE HALF SHEET 40X57 (DRAPES) ×4 IMPLANT
DRAPE ORTHO SPLIT 77X108 STRL (DRAPES) ×2
DRAPE SURG 17X23 STRL (DRAPES) ×2 IMPLANT
DRAPE SURG ORHT 6 SPLT 77X108 (DRAPES) ×2 IMPLANT
DRAPE U-SHAPE 47X51 STRL (DRAPES) ×2 IMPLANT
DRSG MEPILEX BORDER 4X8 (GAUZE/BANDAGES/DRESSINGS) ×3 IMPLANT
ELECT REM PT RETURN 9FT ADLT (ELECTROSURGICAL) ×2
ELECTRODE REM PT RTRN 9FT ADLT (ELECTROSURGICAL) ×1 IMPLANT
GAUZE SPONGE 4X4 12PLY STRL (GAUZE/BANDAGES/DRESSINGS) ×1 IMPLANT
GLOVE BIO SURGEON STRL SZ 6.5 (GLOVE) ×6 IMPLANT
GLOVE BIO SURGEON STRL SZ7.5 (GLOVE) ×8 IMPLANT
GLOVE BIOGEL PI IND STRL 6.5 (GLOVE) ×1 IMPLANT
GLOVE BIOGEL PI IND STRL 7.5 (GLOVE) ×1 IMPLANT
GLOVE BIOGEL PI INDICATOR 6.5 (GLOVE) ×1
GLOVE BIOGEL PI INDICATOR 7.5 (GLOVE) ×1
GOWN STRL REUS W/ TWL LRG LVL3 (GOWN DISPOSABLE) ×3 IMPLANT
GOWN STRL REUS W/TWL LRG LVL3 (GOWN DISPOSABLE) ×3
GUIDEPIN VERSANAIL DSP 3.2X444 (ORTHOPEDIC DISPOSABLE SUPPLIES) ×1 IMPLANT
K-WIRE 2.0 (WIRE) ×1
K-WIRE FXSTD 280X2XNS SS (WIRE) ×1
KIT BASIN OR (CUSTOM PROCEDURE TRAY) ×2 IMPLANT
KIT TURNOVER KIT B (KITS) ×2 IMPLANT
KWIRE FXSTD 280X2XNS SS (WIRE) IMPLANT
NAIL HIP FRACT 130D 11X180 (Screw) ×1 IMPLANT
NS IRRIG 1000ML POUR BTL (IV SOLUTION) ×2 IMPLANT
PACK TOTAL JOINT (CUSTOM PROCEDURE TRAY) ×2 IMPLANT
PAD ARMBOARD 7.5X6 YLW CONV (MISCELLANEOUS) ×4 IMPLANT
PAD CAST 4YDX4 CTTN HI CHSV (CAST SUPPLIES) IMPLANT
PADDING CAST COTTON 4X4 STRL (CAST SUPPLIES) ×1
PADDING CAST COTTON 6X4 STRL (CAST SUPPLIES) ×1 IMPLANT
PLATE DIST FEM 12H (Plate) ×1 IMPLANT
SCREW 5.0 70MM (Screw) ×2 IMPLANT
SCREW CORTICAL NCB 5.0X44 (Screw) ×1 IMPLANT
SCREW CORTICAL NCB 5.0X65 (Screw) ×1 IMPLANT
SCREW LAG HIP NAIL 10.5X95 (Screw) ×1 IMPLANT
SCREW NCB 3.5X75X5X6.2XST (Screw) IMPLANT
SCREW NCB 4.0MX38M (Screw) ×2 IMPLANT
SCREW NCB 4.0MX44M (Screw) ×1 IMPLANT
SCREW NCB 5.0X38 (Screw) ×1 IMPLANT
SCREW NCB 5.0X75MM (Screw) ×2 IMPLANT
SCREW UNI CORTICAL 5.0X14MM (Screw) ×2 IMPLANT
SCREW UNICORTICAL 5.0X14 (Screw) IMPLANT
SPONGE T-LAP 18X18 ~~LOC~~+RFID (SPONGE) IMPLANT
STAPLER VISISTAT 35W (STAPLE) ×2 IMPLANT
SUCTION FRAZIER HANDLE 10FR (MISCELLANEOUS) ×1
SUCTION TUBE FRAZIER 10FR DISP (MISCELLANEOUS) ×1 IMPLANT
SUT ETHILON 3 0 PS 1 (SUTURE) ×4 IMPLANT
SUT MNCRL AB 3-0 PS2 18 (SUTURE) ×2 IMPLANT
SUT VIC AB 0 CT1 27 (SUTURE)
SUT VIC AB 0 CT1 27XBRD ANBCTR (SUTURE) IMPLANT
SUT VIC AB 1 CT1 27 (SUTURE)
SUT VIC AB 1 CT1 27XBRD ANBCTR (SUTURE) IMPLANT
SUT VIC AB 2-0 CT1 27 (SUTURE) ×3
SUT VIC AB 2-0 CT1 TAPERPNT 27 (SUTURE) ×2 IMPLANT
TOWEL GREEN STERILE (TOWEL DISPOSABLE) ×4 IMPLANT

## 2021-07-23 NOTE — Op Note (Signed)
Orthopaedic Surgery Operative Note (CSN: [id number] ) Date of Surgery: 07/23/2021  Admit Date: 07/21/2021   Diagnoses: Pre-Op Diagnoses: Left periprosthetic femoral shaft fracture  Post-Op Diagnosis: Same  Procedures: CPT 17616-WVPX reduction internal fixation of left distal femur fracture CPT 27817-Prophylactic nailing of left intertrochanteric femur CPT 20680-Removal of hardware left femur  Surgeons : Primary: Shona Needles, MD  Assistant: Patrecia Pace, PA-C  Location: OR 7   Anesthesia:General   Antibiotics: Ancef 2g preop with 1 gm vancomycin powder placed topically   Tourniquet time:None    Estimated Blood TGGY:694 mL  Complications:None  Specimens:None   Implants: Implant Name Type Inv. Item Serial No. Manufacturer Lot No. LRB No. Used Action  CAP LOCK NCB - [id number] Cap CAP LOCK NCB  ZIMMER [employer]ON(ORTH,TRAU,BIO,SG)  Left 1 Implanted  NAIL HIP FRACT 130D 11X180 - [id number] Screw NAIL HIP FRACT 130D 11X180  Z[employer](ORTH,TRAU,BIO,SG) [id number] Left 1 Implanted  SCREW LAG HIP NAIL 10.5X95 - [id number] Screw SCREW LAG HIP NAIL 10.5X95  [employer]ON(ORTH,TRAU,BIO,SG) [id number] Left 1 Implanted  HIP FRAC NAIL LEFT 11X360MM - [id number] Orthopedic Implant HIP FRAC NAIL LEFT 11X360MM  ZIMMER [employer]ON(ORTH,TRAU,BIO,SG) [id number] Left 1 Explanted  HFN LAG SCREW 10.5MM X 85MM - [id number] Screw HFN LAG SCREW 10.5MM X 85MM  ZIMMER [employer]ON(ORTH,TRAU,BIO,SG) [id number] Left 1 Explanted  SCREW BONE CORTICAL 5.0X3 - [id number] Screw SCREW BONE CORTICAL 5.0X3  ZIMMER [employer]ON(ORTH,TRAU,BIO,SG) [id number] Left 1 Explanted  SCREW BONE CORTICAL 5.0X40 - [id number] Screw SCREW BONE CORTICAL 5.0X40  ZIMMER [employer]ON(ORTH,TRAU,BIO,SG) [id number] Left 1 Explanted  PLATE DIST FEM 97D - [id number] Plate PLATE DIST FEM 83M  ZIMMER [employer]ON(ORTH,TRAU,BIO,SG)  Left 1 Implanted  SCREW 5.0 70MM - [id number] Screw SCREW 5.0 70MM  ZIMMER [employer]ON(ORTH,TRAU,BIO,SG)  Left 2 Implanted  SCREW NCB 5.0X75MM - [id number] Screw SCREW  NCB 5.0X75MM  ZIMMER [employer]ON(ORTH,TRAU,BIO,SG)  Left 2 Implanted  SCREW NCB 5.0X38 - [id number] Screw SCREW NCB 5.0X38  ZIMMER [employer]ON(ORTH,TRAU,BIO,SG)  Left 1 Implanted  SCREW CORTICAL NCB 5.0X44 - [id number] Screw SCREW CORTICAL NCB 5.0X44  ZIMMER RECON(ORTH,TRAU,BIO,SG)  Left 1 Implanted  SCREW UNI CORTICAL 5.0X14MM - [id number] Screw SCREW UNI CORTICAL 5.0X14MM  ZIMMER [employer]ON(ORTH,TRAU,BIO,SG)  Left 2 Implanted  SCREW NCB 4.0MX38M - [id number] Screw SCREW NCB 4.0MX38M  ZIMMER RECON(ORTH,TRAU,BIO,SG)  Left 2 Implanted     Indications for Surgery: 88 year old female who sustained a left femoral shaft fracture she underwent cephalomedullary nailing on 07/13/2021 by Dr. Roland Rack.  She was doing well until she was standing on her leg and her leg completely gave out.  X-rays showed fracture of her distal femur at the location of her distal interlocks.  Her nail had catastrophic failure and she was subsequently transferred to [facility].  Due to the unstable nature of her injury I recommend proceeding with open reduction internal fixation of left femur.  I discussed risks and benefits with the patient.  Risks included but not limited to bleeding, infection, malunion, nonunion, hardware failure, hardware irritation, nerve or blood vessel injury, DVT, even the possibility anesthetic complications.  She agreed to proceed with surgery and consent was obtained.  Operative Findings: 1.  Removal of previous cephalomedullary nail from left femur. 2.  Open reduction internal fixation of left distal femur fracture using [employer] NCB distal femoral locking plate 3.  Prophylactic fixation of left intertrochanteric femur using [employer]immer [employer]omet Affixus 11 x 180 mm nail.  Procedure: The patient was identified in the preoperative holding area. Consent was confirmed with the patient and their family and all questions were answered.  The operative extremity was marked after confirmation with the patient. she was then  brought back to the operating room by our anesthesia colleagues.  She was carefully placed under anesthesia and transferred over to a radiolucent flat top table.  A bump was placed under her operative hip.  The left lower extremity was then prepped and draped in usual sterile fashion.  A timeout was performed to verify the patient, the procedure, and the extremity.  Preoperative antibiotics were dosed.  The hip and knee were flexed over a triangle.  Fluoroscopic imaging showed the unstable nature of her injury.  A lateral approach to her distal femur was made and carried down through skin and subcutaneous tissue.  I incised through the IT band and mobilized the vastus lateralis to expose the fracture site.  Here I grasped and remove the interlocking screws.  I then attempted to reduce the nail into the distal portion of the femur.  Unfortunately the length of the nail and the bow of the nail made it so that I was not able to place it back into the cancellous bone and it was still in the patellofemoral joint.  As result I felt that removal of the nail was most appropriate.  I then reopened the proximal incisions and then I proceeded to unlock the setscrew to the nail.  I then threaded the extraction bolt into the proximal portion of the nail.  I then retrieve the lag screw and remove this and then I was able to extract the nail without difficulty.  Due to her age and the previous nail that was in place I felt that prophylactic fixation of the proximal femur was appropriate.  As result I used a 11 x 180 mm nail and placed this down the center of the canal.  I then used the targeting arm to direct a threaded guidewire into the head neck segment.  I confirmed adequate tip apex distance using fluoroscopic imaging.  I then drilled and placed a lag screw obtaining excellent fixation.  I did not place a distal interlocking screw so that would not interfere with the plate fixation of the femoral shaft and distal  femur.  Alignment was obtained of the femoral shaft fracture and a 12 hole [employer] NCB distal femoral locking plate was attached to the targeting arm and slid submuscularly along the lateral cortex of the femur.  A 2.0 mm K wire was placed distally to align the distal portion of the plate.  A 3.3 mm drill bit was placed bicortically just anterior to the hip nail.  I then proceeded to place 5.0 millimeter screws distally to bring the plate flush to bone.  I then placed a 5.0 millimeter screw distal to the cephalomedullary nail in the proximal portion of the femur to bring the plate flush to bone.  I then proceeded to place unicortical screws in the shaft.  I was able to drill and placed bicortical 4.0 millimeter screws anterior to the hip nail.  I then returned to the distal segment and proceeded to place 5.0 millimeter screws to complete the construct.  Locking screws were placed on all of the distal screws and on the unicortical lockers and the most distal 4.0 millimeter screw.  Final fluoroscopic imaging was obtained.  The incisions were copiously irrigated.  A gram of vancomycin powder was placed into the incision.  A layered closure of 0 Vicryl, 2-0 Vicryl 3-0 Monocryl and Dermabond was used to close the skin.  Sterile dressings  were applied.  Patient was then awoke from anesthesia and taken to the PACU in stable condition.  Post Op Plan/Instructions: Patient will be weightbearing as tolerated to left lower extremity.  She will receive postoperative Ancef.  She will receive Lovenox for DVT prophylaxis and discharged on an oral DOAC.  We will have her mobilize with physical and Occupational Therapy.  I was present and performed the entire surgery.  Patrecia Pace, PA-C did assist me throughout the case. An assistant was necessary given the difficulty in approach, maintenance of reduction and ability to instrument the fracture.   Katha Hamming, MD [facility]

## 2021-07-23 NOTE — Evaluation (Signed)
Physical Therapy Evaluation Patient Details Name: Lisa Mills MRN: [id number] DOB: 1929-04-13 Today's Date: 07/23/2021  History of Present Illness  Patient is an 88 year old female with a history of HTN, CKD, anemia, DM, hypothyroidism, HLD, admitted 07/21/21 for left femur periprosthetic fracture while in SNF. Recently hospitalized 6/24-6/27 for fall, left femur fracture and ORIF. Underwent hardware removal, ORIF, and Prophylactic nailing of left intertrochanteric femur on 7/5.  Clinical Impression  Patient is s/p above surgery resulting in functional limitations due to the deficits listed below (see PT Problem List). Pt participated in transfer training with mod assist for bed mobility and stand pivot transfer. From SNF and plans to return to complete rehab as she lives alone. Motivated and eager to be able to walk again. Patient will benefit from skilled PT to increase their independence and safety with mobility to allow discharge to the venue listed below.          Recommendations for follow up therapy are one component of a multi-disciplinary discharge planning process, led by the attending physician.  Recommendations may be updated based on patient status, additional functional criteria and insurance authorization.  Follow Up Recommendations Skilled nursing-short term rehab (<3 hours/day) Can patient physically be transported by private vehicle: No    Assistance Recommended at Discharge Frequent or constant Supervision/Assistance  Patient can return home with the following  A lot of help with bathing/dressing/bathroom;Assistance with cooking/housework;Assist for transportation;Help with stairs or ramp for entrance;Two people to help with walking and/or transfers    Equipment Recommendations None recommended by PT  Recommendations for Other Services       Functional Status Assessment Patient has had a recent decline in their functional status and demonstrates the ability to make  significant improvements in function in a reasonable and predictable amount of time.     Precautions / Restrictions Precautions Precautions: Fall Restrictions Weight Bearing Restrictions: Yes LLE Weight Bearing: Weight bearing as tolerated      Mobility  Bed Mobility Overal bed mobility: Needs Assistance Bed Mobility: Supine to Sit     Supine to sit: Mod assist     General bed mobility comments: Mod assist for transition to EOB on Rt side of bed, able to move LLE out of bed rather well with some slight assistance towards end of distance. Heavier assist to scoot to EOB.    Transfers Overall transfer level: Needs assistance Equipment used: Rolling walker (2 wheels) Transfers: Sit to/from Stand, Bed to chair/wheelchair/BSC Sit to Stand: Mod assist Stand pivot transfers: Mod assist         General transfer comment: Mod assist for boost to rise from bed, cues for technique. Able to place minimal weight through LLE for pivot to recliner. Cues for technique throughout, mod assist to manipulate RW in desired direction and to assist with weight shift and balance due to lower tolerance with LLE on ground.    Ambulation/Gait                  Stairs            Wheelchair Mobility    Modified Rankin (Stroke Patients Only)       Balance Overall balance assessment: Needs assistance Sitting-balance support: Bilateral upper extremity supported, Feet supported Sitting balance-Leahy Scale: Good     Standing balance support: Bilateral upper extremity supported, Reliant on assistive device for balance, During functional activity Standing balance-Leahy Scale: Poor  Pertinent Vitals/Pain Pain Assessment Pain Assessment: 0-10 Pain Score: 6  Pain Location: Left hip Pain Descriptors / Indicators: Dull, Discomfort, Aching, Grimacing Pain Intervention(s): Limited activity within patient's tolerance, Monitored during session,  Premedicated before session, Repositioned    Home Living Family/patient expects to be discharged to:: Skilled nursing facility ((Was inpatient at [facility])) Living Arrangements: Alone Available Help at Discharge: Family;Available PRN/intermittently (son prn) Type of Home: House Home Access: Stairs to enter Entrance Stairs-Rails: Can reach both Entrance Stairs-Number of Steps: 4   Home Layout: One level Home Equipment: Conservation officer, nature (2 wheels);Cane - single point;Wheelchair - manual;BSC/3in1 Additional Comments: son lives nearby but works full time.    Prior Function Prior Level of Function : Independent/Modified Independent             Mobility Comments: ind community ambulator using no AD, limited driving. This was prior to recent Fx and SNF placement. ADLs Comments: Prior to SNF placement; ind w/ ADLs; sponge bathes 2/2 fear of falling in bathtub.     Hand Dominance   Dominant Hand: Right (Uses Lt hand more for last year due to Lt elbow fracture and Ulnar nerve damage.)    Extremity/Trunk Assessment   Upper Extremity Assessment Upper Extremity Assessment: Defer to OT evaluation RUE Deficits / Details: R wrist in slight flexion and radial deviation, prior hx of fx in R arm per pt    Lower Extremity Assessment Lower Extremity Assessment: Generalized weakness;LLE deficits/detail LLE Deficits / Details: Consistent with post op fx of femur LLE: Unable to fully assess due to pain LLE Sensation: WNL LLE Coordination:  (volitionally moving toes and ankle on LT)       Communication   Communication: HOH  Cognition Arousal/Alertness: Awake/alert Behavior During Therapy: WFL for tasks assessed/performed Overall Cognitive Status: Within Functional Limits for tasks assessed                                          General Comments General comments (skin integrity, edema, etc.): Ice to Lt lateral thigh    Exercises     Assessment/Plan     PT Assessment Patient needs continued PT services  PT Problem List Decreased strength;Decreased mobility;Decreased range of motion;Decreased activity tolerance;Decreased balance;Decreased knowledge of use of DME;Pain       PT Treatment Interventions DME instruction;Therapeutic exercise;Gait training;Balance training;Stair training;Functional mobility training;Therapeutic activities;Patient/family education;Neuromuscular re-education    PT Goals (Current goals can be found in the Care Plan section)  Acute Rehab PT Goals Patient Stated Goal: pt would like to get back to walking PT Goal Formulation: With patient Time For Goal Achievement: 07/30/21 Potential to Achieve Goals: Fair    Frequency Min 3X/week     Co-evaluation               AM-PAC PT "6 Clicks" Mobility  Outcome Measure Help needed turning from your back to your side while in a flat bed without using bedrails?: A Little Help needed moving from lying on your back to sitting on the side of a flat bed without using bedrails?: A Little Help needed moving to and from a bed to a chair (including a wheelchair)?: A Lot Help needed standing up from a chair using your arms (e.g., wheelchair or bedside chair)?: A Lot Help needed to walk in hospital room?: A Lot Help needed climbing 3-5 steps with a railing? : Total 6 Click  Score: 13    End of Session Equipment Utilized During Treatment: Gait belt Activity Tolerance: Patient limited by pain Patient left: in chair;with call bell/phone within reach;with chair alarm set Nurse Communication: Mobility status;Other (comment) (Purewik off) PT Visit Diagnosis: Other abnormalities of gait and mobility (R26.89);Difficulty in walking, not elsewhere classified (R26.2);Muscle weakness (generalized) (M62.81);Pain;History of falling (Z91.81);Unsteadiness on feet (R26.81) Pain - Right/Left: Left Pain - part of body: Hip    Time: 5397-6734 PT Time Calculation (min) (ACUTE ONLY): 45  min   Charges:   PT Evaluation $PT Eval Moderate Complexity: 1 Mod PT Treatments $Therapeutic Activity: 23-37 mins        Candie Mile, PT    Ellouise Newer 07/23/2021, 3:21 PM

## 2021-07-23 NOTE — Anesthesia Postprocedure Evaluation (Signed)
Anesthesia Post Note  Patient: Lisa Mills  Procedure(s) Performed: OPEN REDUCTION INTERNAL FIXATION (ORIF) DISTAL FEMUR FRACTURE (Left) INTRAMEDULLARY (IM) NAIL INTERTROCHANTRIC (Left) HARDWARE REMOVAL (Left)     Patient location during evaluation: PACU Anesthesia Type: MAC and Spinal Level of consciousness: awake and alert Pain management: pain level controlled Vital Signs Assessment: post-procedure vital signs reviewed and stable Respiratory status: spontaneous breathing, nonlabored ventilation, respiratory function stable and patient connected to nasal cannula oxygen Cardiovascular status: stable and blood pressure returned to baseline Postop Assessment: no apparent nausea or vomiting Anesthetic complications: no   No notable events documented.  Last Vitals:  Vitals:   07/23/21 1200 07/23/21 1215  BP: (!) 165/61 (!) 154/58  Pulse: 82 82  Resp: 16 (!) 23  Temp:    SpO2: 96% 96%    Last Pain:  Vitals:   07/23/21 1200  TempSrc:   PainSc: Asleep                 Belenda Cruise P Hayze Gazda

## 2021-07-23 NOTE — Transfer of Care (Signed)
Immediate Anesthesia Transfer of Care Note  Patient: Lisa Mills  Procedure(s) Performed: OPEN REDUCTION INTERNAL FIXATION (ORIF) DISTAL FEMUR FRACTURE (Left) INTRAMEDULLARY (IM) NAIL INTERTROCHANTRIC (Left) HARDWARE REMOVAL (Left)  Patient Location: PACU  Anesthesia Type:MAC and Spinal  Level of Consciousness: awake and alert   Airway & Oxygen Therapy: Patient Spontanous Breathing  Post-op Assessment: Report given to RN and Post -op Vital signs reviewed and stable  Post vital signs: Reviewed and stable  Last Vitals:  Vitals Value Taken Time  BP 165/66 07/23/21 1100  Temp    Pulse 86 07/23/21 1103  Resp 11 07/23/21 1103  SpO2 97 % 07/23/21 1103  Vitals shown include unvalidated device data.  Last Pain:  Vitals:   07/23/21 0751  TempSrc:   PainSc: 0-No pain      Patients Stated Pain Goal: 0 (09/64/38 3818)  Complications: No notable events documented.

## 2021-07-23 NOTE — Interval H&P Note (Signed)
History and Physical Interval Note:  07/23/2021 8:16 AM  Lisa Mills  has presented today for surgery, with the diagnosis of Left periprosthetic femur fracture.  The various methods of treatment have been discussed with the patient and family. After consideration of risks, benefits and other options for treatment, the patient has consented to  Procedure(s): OPEN REDUCTION INTERNAL FIXATION (ORIF) DISTAL FEMUR FRACTURE (Left) as a surgical intervention.  The patient's history has been reviewed, patient examined, no change in status, stable for surgery.  I have reviewed the patient's chart and labs.  Questions were answered to the patient's satisfaction.     Lennette Bihari P Kamali Sakata

## 2021-07-23 NOTE — Anesthesia Procedure Notes (Signed)
Spinal  Patient location during procedure: OR Start time: 07/23/2021 8:40 AM End time: 07/23/2021 8:45 AM Staffing Performed: anesthesiologist  Anesthesiologist: Darral Dash, DO Performed by: Darral Dash, DO Authorized by: Darral Dash, DO   Preanesthetic Checklist Completed: patient identified, IV checked, site marked, risks and benefits discussed, surgical consent, monitors and equipment checked, pre-op evaluation and timeout performed Spinal Block Patient position: right lateral decubitus Prep: DuraPrep Patient monitoring: heart rate, cardiac monitor, continuous pulse ox and blood pressure Approach: midline Location: L4-5 Injection technique: single-shot Needle Needle type: Pencan  Needle gauge: 24 G Needle length: 10 cm Assessment Events: CSF return Additional Notes Patient identified. Risks/Benefits/Options discussed with patient including but not limited to bleeding, infection, nerve damage, paralysis, failed block, incomplete pain control, headache, blood pressure changes, nausea, vomiting, reactions to medications, itching and postpartum back pain. Confirmed with bedside nurse the patient's most recent platelet count. Confirmed with patient that they are not currently taking any anticoagulation, have any bleeding history or any family history of bleeding disorders. Patient expressed understanding and wished to proceed. All questions were answered. Sterile technique was used throughout the entire procedure. Please see nursing notes for vital signs. Warning signs of high block given to the patient including shortness of breath, tingling/numbness in hands, complete motor block, or any concerning symptoms with instructions to call for help. Patient was given instructions on fall risk and not to get out of bed. All questions and concerns addressed with instructions to call with any issues or inadequate analgesia.

## 2021-07-23 NOTE — Anesthesia Preprocedure Evaluation (Addendum)
Anesthesia Evaluation  Patient identified by MRN, date of birth, ID band Patient awake    Reviewed: Allergy & Precautions, NPO status , Patient's Chart, lab work & pertinent test results  Airway Mallampati: II  TM Distance: >3 FB Neck ROM: Full    Dental  (+) Edentulous Upper, Edentulous Lower   Pulmonary neg pulmonary ROS,    Pulmonary exam normal        Cardiovascular hypertension, Pt. on medications  Rhythm:Regular Rate:Normal     Neuro/Psych  Headaches, Anxiety Depression    GI/Hepatic Neg liver ROS, GERD  Medicated,  Endo/Other  diabetesHypothyroidism   Renal/GU CRFRenal disease  negative genitourinary   Musculoskeletal  (+) Arthritis , Osteoarthritis,    Abdominal Normal abdominal exam  (+)   Peds  Hematology  (+) Blood dyscrasia, anemia ,   Anesthesia Other Findings   Reproductive/Obstetrics                            Anesthesia Physical Anesthesia Plan  ASA: 3  Anesthesia Plan: MAC and Spinal   Post-op Pain Management:    Induction: Intravenous  PONV Risk Score and Plan: 2 and Ondansetron, Dexamethasone, Treatment may vary due to age or medical condition and Propofol infusion  Airway Management Planned: Simple Face Mask, Natural Airway and Nasal Cannula  Additional Equipment: None  Intra-op Plan:   Post-operative Plan:   Informed Consent: I have reviewed the patients History and Physical, chart, labs and discussed the procedure including the risks, benefits and alternatives for the proposed anesthesia with the patient or authorized representative who has indicated his/her understanding and acceptance.     Dental advisory given  Plan Discussed with: CRNA  Anesthesia Plan Comments: (Lab Results      Component                Value               Date                      WBC                      7.3                 07/23/2021                HGB                       7.5 (L)             07/23/2021                HCT                      21.5 (L)            07/23/2021                MCV                      90.3                07/23/2021                PLT                      474 (H)  07/23/2021           Lab Results      Component                Value               Date                      NA                       127 (L)             07/23/2021                K                        4.2                 07/23/2021                CO2                      25                  07/23/2021                GLUCOSE                  131 (H)             07/23/2021                BUN                      25 (H)              07/23/2021                CREATININE               1.10 (H)            07/23/2021                CALCIUM                  8.7 (L)             07/23/2021                EGFR                     53 (L)              03/14/2021                GFRNONAA                 48 (L)              07/23/2021          )       Anesthesia Quick Evaluation

## 2021-07-23 NOTE — Evaluation (Signed)
Occupational Therapy Evaluation Patient Details Name: Lisa Mills MRN: [id number] DOB: 07/22/1929 Today's Date: 07/23/2021   History of Present Illness Patient is an 88 year old female with a history of HTN, CKD, anemia, DM, hypothyroidism, HLD, admitted 07/21/21 for left femur periprosthetic fracture while in SNF. Recently hospitalized 6/24-6/27 for fall, left femur fracture and ORIF. Underwent hardware removal, ORIF, and Prophylactic nailing of left intertrochanteric femur on 7/5.   Clinical Impression   Patient admitted after a mechanical fall and new fracture to left leg.  Patient remains WBAT, but states pain and weakness to her left leg are the deficit.  The patient is needing up to Max A for lower body ADL, and up to Mod A for basic mobility.  She is very motivated to regain her independence, and return home.  OT will follow in the acute setting with a return to SNF level post acute rehab to maximize function prior to returning home.        Recommendations for follow up therapy are one component of a multi-disciplinary discharge planning process, led by the attending physician.  Recommendations may be updated based on patient status, additional functional criteria and insurance authorization.   Follow Up Recommendations  Skilled nursing-short term rehab (<3 hours/day)    Assistance Recommended at Discharge Frequent or constant Supervision/Assistance  Patient can return home with the following A lot of help with walking and/or transfers;A lot of help with bathing/dressing/bathroom;Help with stairs or ramp for entrance;Assist for transportation;Assistance with cooking/housework    Functional Status Assessment  Patient has had a recent decline in their functional status and demonstrates the ability to make significant improvements in function in a reasonable and predictable amount of time.  Equipment Recommendations  None recommended by OT    Recommendations for Other Services        Precautions / Restrictions Precautions Precautions: Fall Restrictions Weight Bearing Restrictions: Yes LLE Weight Bearing: Weight bearing as tolerated      Mobility Bed Mobility Overal bed mobility: Needs Assistance Bed Mobility: Supine to Sit, Sit to Supine     Supine to sit: Mod assist Sit to supine: Mod assist, Max assist   General bed mobility comments: difficulty lifting and advancing/moving L leg    Transfers                          Balance Overall balance assessment: Needs assistance Sitting-balance support: Bilateral upper extremity supported, Feet supported Sitting balance-Leahy Scale: Fair   Postural control: Right lateral lean Standing balance support: Reliant on assistive device for balance Standing balance-Leahy Scale: Poor                             ADL either performed or assessed with clinical judgement   ADL Overall ADL's : Needs assistance/impaired Eating/Feeding: Set up;Sitting   Grooming: Wash/dry hands;Wash/dry face;Set up;Sitting   Upper Body Bathing: Set up;Sitting   Lower Body Bathing: Maximal assistance;Sitting/lateral leans   Upper Body Dressing : Set up;Sitting   Lower Body Dressing: Maximal assistance;Sitting/lateral leans   Toilet Transfer: Moderate assistance;Rolling walker (2 wheels);Stand-pivot;BSC/3in1                   Vision Patient Visual Report: No change from baseline       Perception Perception Perception: Within Functional Limits   Praxis Praxis Praxis: Intact    Pertinent Vitals/Pain Pain Assessment Pain Assessment: Faces Faces Pain Scale: Hurts even  more Pain Location: Left hip Pain Descriptors / Indicators: Grimacing, Guarding, Sharp Pain Intervention(s): RN gave pain meds during session     Hand Dominance Right   Extremity/Trunk Assessment Upper Extremity Assessment Upper Extremity Assessment: Overall WFL for tasks assessed;RUE deficits/detail RUE Deficits / Details:  Radial nn damage after a fractured elbow.  Chronic - has wrist spints at home. RUE Sensation: WNL RUE Coordination: decreased fine motor   Lower Extremity Assessment Lower Extremity Assessment: Defer to PT evaluation LLE Deficits / Details: Consistent with post op fx of femur LLE: Unable to fully assess due to pain LLE Sensation: WNL LLE Coordination:  (volitionally moving toes and ankle on LT)   Cervical / Trunk Assessment Cervical / Trunk Assessment: Kyphotic   Communication Communication Communication: HOH   Cognition Arousal/Alertness: Awake/alert Behavior During Therapy: Anxious Overall Cognitive Status: Within Functional Limits for tasks assessed                                       General Comments  VSS on RA, patient is very anxious.      Exercises     Shoulder Instructions      Home Living Family/patient expects to be discharged to:: Skilled nursing facility Living Arrangements: Alone Available Help at Discharge: Family;Available PRN/intermittently Type of Home: House Home Access: Stairs to enter [employer] of Steps: 4 Entrance Stairs-Rails: Can reach both Home Layout: One level     Bathroom Shower/Tub: Tub/shower unit;Sponge bathes at baseline   [employer]: Handicapped height Bathroom Accessibility: Yes   Home Equipment: Conservation officer, nature (2 wheels);Cane - single point;Wheelchair - manual;BSC/3in1;Shower seat   Additional Comments: son lives nearby but works full time.      Prior Functioning/Environment Prior Level of Function : Independent/Modified Independent             Mobility Comments: ind community ambulator using no AD, limited driving. This was prior to recent Fx and SNF placement. ADLs Comments: Prior to SNF placement; ind w/ ADLs; sponge bathes 2/2 fear of falling in bathtub.  Ind with light meal prep, was driving locally, no assist with medications or bill payment.  Cares for her small dog.        OT  Problem List: Decreased strength;Decreased range of motion;Decreased activity tolerance;Impaired balance (sitting and/or standing);Decreased safety awareness;Pain      OT Treatment/Interventions: Self-care/ADL training;Therapeutic exercise;Energy conservation;DME and/or AE instruction;Therapeutic activities;Patient/family education;Balance training    OT Goals(Current goals can be found in the care plan section) Acute Rehab OT Goals Patient Stated Goal: Finish rehab and go back home OT Goal Formulation: With patient Time For Goal Achievement: 08/06/21 Potential to Achieve Goals: Good ADL Goals Pt Will Perform Grooming: with min guard assist;standing Pt Will Perform Lower Body Dressing: with min assist;sit to/from stand;with adaptive equipment;sitting/lateral leans Pt Will Transfer to Toilet: with min assist;ambulating;regular height toilet  OT Frequency: Min 2X/week    Co-evaluation              AM-PAC OT "6 Clicks" Daily Activity     Outcome Measure Help from another person eating meals?: None Help from another person taking care of personal grooming?: None Help from another person toileting, which includes using toliet, bedpan, or urinal?: A Lot Help from another person bathing (including washing, rinsing, drying)?: A Lot Help from another person to put on and taking off regular upper body clothing?: None Help from another person to  put on and taking off regular lower body clothing?: A Lot 6 Click Score: 18   End of Session Nurse Communication: Mobility status  Activity Tolerance: Patient limited by pain Patient left: in bed;with call bell/phone within reach;with nursing/sitter in room  OT Visit Diagnosis: Unsteadiness on feet (R26.81);Muscle weakness (generalized) (M62.81);Pain Pain - Right/Left: Left Pain - part of body: Hip;Leg                Time: 7408-1448 OT Time Calculation (min): 21 min Charges:  OT General Charges $OT Visit: 1 Visit OT Evaluation $OT Eval  Moderate Complexity: 1 Mod  07/23/2021  RP, OTR/L  Acute Rehabilitation Services  Office:  [phone]   Metta Clines 07/23/2021, 4:40 PM

## 2021-07-23 NOTE — Progress Notes (Signed)
OR called and stated surgery will be at 0830. They will come get her about 0715-0730. Pt is aware. Consent isn't signed due to pt not speaking with surgeon.

## 2021-07-23 NOTE — Anesthesia Procedure Notes (Signed)
Procedure Name: MAC Date/Time: 07/23/2021 8:38 AM  Performed by: Lorie Phenix, CRNAPre-anesthesia Checklist: Patient identified, Emergency Drugs available, Suction available and Patient being monitored Patient Re-evaluated:Patient Re-evaluated prior to induction Oxygen Delivery Method: Nasal cannula Placement Confirmation: positive ETCO2

## 2021-07-23 NOTE — Progress Notes (Signed)
[facility]  PROGRESS NOTE  SENA HOOPINGARNER MHD:[id number] DOB: 02-Nov-1929 DOA: 07/21/2021 PCP: Olin Hauser, DO   Brief HPI:   88 y.o. female with medical history significant for diet-controlled diabetes mellitus, hypothyroidism, hypertension, recurrent UTIs, CKD 3A, chronic anemia, chronic hyponatremia, and left femoral shaft fracture status post reduction and internal fixation on 07/13/2021, now presenting from her SNF for evaluation of left leg pain.  After the recent hospitalization for surgical repair of the left femoral shaft fracture, patient went to acute rehab facility where she reports progressing quite well until few days ago when she was working with PT, shifted her weight from the right foot to the left, and felt her leg "give out."  Since then, she has had reduced range of motion, pain with movement, and difficulty bearing weight.  She had radiographs performed at the facility and was sent to the ED for further evaluation and management.   In the ED, pt was found to be afebrile and saturating well on room air with stable blood pressure.  Chemistry panel with stable hyponatremia and renal insufficiency.  CBC was stable normocytic anemia and new thrombocytosis.  Radiographs of the left femur demonstrate new fracture of the distal femur fracture fragment with dislocation of the intramedullary rod and loosening of screws.   Subjective   Patient seen and examined, complains of pain after surgery.  But pain is controlled with medications.   Assessment/Plan:     Periprosthetic left femoral shaft fracture  - S/p ORIF on 6/25 now presenting with pain and deformity after feeling the leg "give out" a few days ago PTA - Plain radiographs demonstrate new fracture involving distal fracture fragment with displaced IM rod and loosened screws  - Orthopedic Surgery consulted -Patient underwent ORIF left distal femur fracture, prophylactic nailing of left intertrochanteric femur,  removal of hardware left femur -- Cont with analgesia as needed   2. Type II DM  - A1c was 7.5% in February 2023  - cont SSI  -CBG well controlled 6   3. Hyponatremia   - Chronic, appears stable, attributed to SIADH and recently started on salt tabs   - Cont fluid restricted diet   4. CKD IIIa  - Appears stable  - Renally-dose medications    5. Hypothyroidism  - On Synthroid     6. Hypertension  - Hold ACE-i perioperatively, treat as-needed only for now  -BP stable and controlled at this time  7.  Anemia -Hemoglobin is 7.5 -Patient does have chronic anemia -Transfuse for hemoglobin less than 7      Medications     sodium chloride   Intravenous Once   cholecalciferol  2,000 Units Oral Daily   [START ON 07/24/2021] enoxaparin (LOVENOX) injection  40 mg Subcutaneous Q24H   escitalopram  10 mg Oral Daily   feeding supplement  237 mL Oral BID BM   fentaNYL       ferrous sulfate  325 mg Oral Daily   hydrALAZINE       [START ON 07/24/2021] insulin aspart  0-6 Units Subcutaneous TID WC   levothyroxine  25 mcg Oral QAC breakfast   lisinopril  5 mg Oral Daily   multivitamin with minerals  1 tablet Oral Daily   pantoprazole  40 mg Oral Daily   senna  1 tablet Oral QHS   simvastatin  40 mg Oral QHS     Data Reviewed:   CBG:  Recent Labs  Lab 07/23/21 0357 07/23/21 0728 07/23/21  1101 07/23/21 1234 07/23/21 1637  GLUCAP 130* 129* 148* 152* 310*    SpO2: 96 % O2 Flow Rate (L/min): 2 L/min    Vitals:   07/23/21 1138 07/23/21 1145 07/23/21 1200 07/23/21 1215  BP: (!) 205/76 (!) 187/72 (!) 165/61 (!) 154/58  Pulse:  81 82 82  Resp:  12 16 (!) 23  Temp:      TempSrc:      SpO2:  94% 96% 96%      Data Reviewed:  Basic Metabolic Panel: Recent Labs  Lab 07/21/21 1540 07/22/21 0154 07/23/21 0149  NA 127* 130* 127*  K 4.6 4.2 4.2  CL 92* 95* 93*  CO2 '24 26 25  '$ GLUCOSE 159* 135* 131*  BUN 20 22 25*  CREATININE 1.02* 0.96 1.10*  CALCIUM 9.2 8.9 8.7*     CBC: Recent Labs  Lab 07/21/21 1540 07/22/21 0154 07/23/21 0149  WBC 11.1* 7.6 7.3  HGB 8.5* 7.5* 7.5*  HCT 26.0* 21.9* 21.5*  MCV 93.5 92.0 90.3  PLT 519* 451* 474*    LFT No results for input(s): "AST", "ALT", "ALKPHOS", "BILITOT", "PROT", "ALBUMIN" in the last 168 hours.   Antibiotics: Anti-infectives (From admission, onward)    Start     Dose/Rate Route Frequency Ordered Stop   07/23/21 1700  ceFAZolin (ANCEF) IVPB 2g/100 mL premix        2 g 200 mL/hr over 30 Minutes Intravenous Every 12 hours 07/23/21 1233 07/24/21 1659   07/23/21 1008  vancomycin (VANCOCIN) powder  Status:  Discontinued          As needed 07/23/21 1009 07/23/21 1056        DVT prophylaxis: Lovenox  Code Status: Full code  Family Communication: No family at bedside   CONSULTS orthopedics   Objective    Physical Examination:   General-appears in no acute distress Heart-S1-S2, regular, no murmur auscultated Lungs-clear to auscultation bilaterally, no wheezing or crackles auscultated Abdomen-soft, nontender, no organomegaly Extremities-no edema in the lower extremities Neuro-alert, oriented x3, no focal deficit noted  Status is: Inpatient:             Oswald Hillock   [facility] If 7PM-7AM, please contact night-coverage at [email or web address], Office  [phone]   07/23/2021, 5:48 PM  LOS: 2 days

## 2021-07-23 NOTE — Discharge Instructions (Addendum)
[facility] Discharge Instructions   General Discharge Instructions  WEIGHT BEARING STATUS:weightbearing as tolerated  RANGE OF MOTION/ACTIVITY:Ok for hip and knee motion as tolerated  Wound Care:Surgical dressings can be removed. Incisions can be left open to air if there is no drainage. If incision continues to have drainage, follow wound care instructions below. Okay to shower if no drainage from incisions.  DVT/PE prophylaxis:  Eliquis x 30 days  Diet: as you were eating previously.  Can use over the counter stool softeners and bowel preparations, such as Miralax, to help with bowel movements.  Narcotics can be constipating.  Be sure to drink plenty of fluids  PAIN MEDICATION USE AND EXPECTATIONS  You have likely been given narcotic medications to help control your pain.  After a traumatic event that results in an fracture (broken bone) with or without surgery, it is ok to use narcotic pain medications to help control one's pain.  We understand that everyone responds to pain differently and each individual patient will be evaluated on a regular basis for the continued need for narcotic medications. Ideally, narcotic medication use should last no more than 6-8 weeks (coinciding with fracture healing).   As a patient it is your responsibility as well to monitor narcotic medication use and report the amount and frequency you use these medications when you come to your office visit.   We would also advise that if you are using narcotic medications, you should take a dose prior to therapy to maximize you participation.  IF YOU ARE ON NARCOTIC MEDICATIONS IT IS NOT PERMISSIBLE TO OPERATE A MOTOR VEHICLE (MOTORCYCLE/CAR/TRUCK/MOPED) OR HEAVY MACHINERY DO NOT MIX NARCOTICS WITH OTHER CNS (CENTRAL NERVOUS SYSTEM) DEPRESSANTS SUCH AS ALCOHOL   STOP SMOKING OR USING NICOTINE PRODUCTS!!!!  As discussed nicotine severely impairs your body's ability to heal surgical and traumatic  wounds but also impairs bone healing.  Wounds and bone heal by forming microscopic blood vessels (angiogenesis) and nicotine is a vasoconstrictor (essentially, shrinks blood vessels).  Therefore, if vasoconstriction occurs to these microscopic blood vessels they essentially disappear and are unable to deliver necessary nutrients to the healing tissue.  This is one modifiable factor that you can do to dramatically increase your chances of healing your injury.    (This means no smoking, no nicotine gum, patches, etc)  DO NOT USE NONSTEROIDAL ANTI-INFLAMMATORY DRUGS (NSAID'S)  Using products such as Advil (ibuprofen), Aleve (naproxen), Motrin (ibuprofen) for additional pain control during fracture healing can delay and/or prevent the healing response.  If you would like to take over the counter (OTC) medication, Tylenol (acetaminophen) is ok.  However, some narcotic medications that are given for pain control contain acetaminophen as well. Therefore, you should not exceed more than 4000 mg of tylenol in a day if you do not have liver disease.  Also note that there are may OTC medicines, such as cold medicines and allergy medicines that my contain tylenol as well.  If you have any questions about medications and/or interactions please ask your doctor/PA or your pharmacist.      ICE AND ELEVATE INJURED/OPERATIVE EXTREMITY  Using ice and elevating the injured extremity above your heart can help with swelling and pain control.  Icing in a pulsatile fashion, such as 20 minutes on and 20 minutes off, can be followed.    Do not place ice directly on skin. Make sure there is a barrier between to skin and the ice pack.    Using frozen items such as  frozen peas works well as the conform nicely to the are that needs to be iced.  USE AN ACE WRAP OR TED HOSE FOR SWELLING CONTROL  In addition to icing and elevation, Ace wraps or TED hose are used to help limit and resolve swelling.  It is recommended to use Ace wraps or  TED hose until you are informed to stop.    When using Ace Wraps start the wrapping distally (farthest away from the body) and wrap proximally (closer to the body)   Example: If you had surgery on your leg or thing and you do not have a splint on, start the ace wrap at the toes and work your way up to the thigh        If you had surgery on your upper extremity and do not have a splint on, start the ace wrap at your fingers and work your way up to the upper arm  [place]: [phone]   VISIT OUR WEBSITE FOR ADDITIONAL INFORMATION: [email or web address]    Discharge Wound Care Instructions  Do NOT apply any ointments, solutions or lotions to pin sites or surgical wounds.  These prevent needed drainage and even though solutions like hydrogen peroxide kill bacteria, they also damage cells lining the pin sites that help fight infection.  Applying lotions or ointments can keep the wounds moist and can cause them to breakdown and open up as well. This can increase the risk for infection. When in doubt call the office.  If any drainage is noted, use Mepilex dressing.  Once the incision is completely dry and without drainage, it may be left open to air out.  Showering may begin 36-48 hours later.  Cleaning gently with soap and water.

## 2021-07-23 NOTE — Progress Notes (Signed)
Paged MD for hemoglobin 7.5

## 2021-07-24 DIAGNOSIS — E871 Hypo-osmolality and hyponatremia: Secondary | ICD-10-CM | POA: Diagnosis not present

## 2021-07-24 DIAGNOSIS — S7292XA Unspecified fracture of left femur, initial encounter for closed fracture: Secondary | ICD-10-CM | POA: Diagnosis not present

## 2021-07-24 DIAGNOSIS — Z9889 Other specified postprocedural states: Secondary | ICD-10-CM | POA: Diagnosis not present

## 2021-07-24 DIAGNOSIS — E038 Other specified hypothyroidism: Secondary | ICD-10-CM | POA: Diagnosis not present

## 2021-07-24 LAB — BPAM RBC
Blood Product Expiration Date: 202307062359
Blood Product Expiration Date: 202307062359
ISSUE DATE / TIME: 202307060748
Unit Type and Rh: 9500
Unit Type and Rh: 9500

## 2021-07-24 LAB — TYPE AND SCREEN
ABO/RH(D): AB POS
Antibody Screen: NEGATIVE
Unit division: 0
Unit division: 0

## 2021-07-24 LAB — CBC
HCT: 22.9 % — ABNORMAL LOW (ref 36.0–46.0)
Hemoglobin: 7.7 g/dL — ABNORMAL LOW (ref 12.0–15.0)
MCH: 30.9 pg (ref 26.0–34.0)
MCHC: 33.6 g/dL (ref 30.0–36.0)
MCV: 92 fL (ref 80.0–100.0)
Platelets: 512 10*3/uL — ABNORMAL HIGH (ref 150–400)
RBC: 2.49 MIL/uL — ABNORMAL LOW (ref 3.87–5.11)
RDW: 13.3 % (ref 11.5–15.5)
WBC: 12 10*3/uL — ABNORMAL HIGH (ref 4.0–10.5)
nRBC: 0 % (ref 0.0–0.2)

## 2021-07-24 LAB — BASIC METABOLIC PANEL
Anion gap: 9 (ref 5–15)
BUN: 23 mg/dL (ref 8–23)
CO2: 26 mmol/L (ref 22–32)
Calcium: 8.5 mg/dL — ABNORMAL LOW (ref 8.9–10.3)
Chloride: 94 mmol/L — ABNORMAL LOW (ref 98–111)
Creatinine, Ser: 1.01 mg/dL — ABNORMAL HIGH (ref 0.44–1.00)
GFR, Estimated: 53 mL/min — ABNORMAL LOW (ref >60)
Glucose, Bld: 184 mg/dL — ABNORMAL HIGH (ref 70–99)
Potassium: 4.4 mmol/L (ref 3.5–5.1)
Sodium: 129 mmol/L — ABNORMAL LOW (ref 135–145)

## 2021-07-24 LAB — GLUCOSE, CAPILLARY
Glucose-Capillary: 203 mg/dL — ABNORMAL HIGH (ref 70–99)
Glucose-Capillary: 214 mg/dL — ABNORMAL HIGH (ref 70–99)
Glucose-Capillary: 218 mg/dL — ABNORMAL HIGH (ref 70–99)
Glucose-Capillary: 203 mg/dL — ABNORMAL HIGH (ref 70–99)

## 2021-07-24 LAB — VITAMIN D 25 HYDROXY (VIT D DEFICIENCY, FRACTURES): Vit D, 25-Hydroxy: 79.15 ng/mL (ref 30–100)

## 2021-07-24 MED ORDER — AMLODIPINE BESYLATE 5 MG PO TABS
5.0000 mg | ORAL_TABLET | Freq: Every day | ORAL | Status: DC
  Administered 2021-07-24 – 2021-07-28 (×5): 5 mg via ORAL
  Filled 2021-07-24 (×5): qty 1

## 2021-07-24 MED ORDER — ENOXAPARIN SODIUM 30 MG/0.3ML IJ SOSY
30.0000 mg | PREFILLED_SYRINGE | INTRAMUSCULAR | Status: DC
  Administered 2021-07-25 – 2021-07-28 (×4): 30 mg via SUBCUTANEOUS
  Filled 2021-07-24 (×4): qty 0.3

## 2021-07-24 MED ORDER — INSULIN ASPART 100 UNIT/ML IJ SOLN
0.0000 [IU] | Freq: Three times a day (TID) | INTRAMUSCULAR | Status: DC
  Administered 2021-07-24 (×2): 3 [IU] via SUBCUTANEOUS
  Administered 2021-07-25: 2 [IU] via SUBCUTANEOUS
  Administered 2021-07-25: 3 [IU] via SUBCUTANEOUS
  Administered 2021-07-25: 2 [IU] via SUBCUTANEOUS
  Administered 2021-07-26: 5 [IU] via SUBCUTANEOUS
  Administered 2021-07-26 (×2): 2 [IU] via SUBCUTANEOUS
  Administered 2021-07-27: 5 [IU] via SUBCUTANEOUS
  Administered 2021-07-27: 2 [IU] via SUBCUTANEOUS
  Administered 2021-07-27: 3 [IU] via SUBCUTANEOUS
  Administered 2021-07-28: 2 [IU] via SUBCUTANEOUS

## 2021-07-24 NOTE — Plan of Care (Signed)

## 2021-07-24 NOTE — Progress Notes (Signed)
[facility]  PROGRESS NOTE  [name] GUR:[id number] DOB: 1929-07-01 DOA: 07/21/2021 PCP: Olin Hauser, DO   Brief HPI:   88 y.o. female with medical history significant for diet-controlled diabetes mellitus, hypothyroidism, hypertension, recurrent UTIs, CKD 3A, chronic anemia, chronic hyponatremia, and left femoral shaft fracture status post reduction and internal fixation on 07/13/2021, now presenting from her SNF for evaluation of left leg pain.  After the recent hospitalization for surgical repair of the left femoral shaft fracture, patient went to acute rehab facility where she reports progressing quite well until few days ago when she was working with PT, shifted her weight from the right foot to the left, and felt her leg "give out."  Since then, she has had reduced range of motion, pain with movement, and difficulty bearing weight.  She had radiographs performed at the facility and was sent to the ED for further evaluation and management.   In the ED, pt was found to be afebrile and saturating well on room air with stable blood pressure.  Chemistry panel with stable hyponatremia and renal insufficiency.  CBC was stable normocytic anemia and new thrombocytosis.  Radiographs of the left femur demonstrate new fracture of the distal femur fracture fragment with dislocation of the intramedullary rod and loosening of screws.   Subjective   Patient seen and examined, denies pain.  Blood glucose has been elevated   Assessment/Plan:     Periprosthetic left femoral shaft fracture  - S/p ORIF on 6/25 now presenting with pain and deformity after feeling the leg "give out" a few days ago PTA - Plain radiographs demonstrate new fracture involving distal fracture fragment with displaced IM rod and loosened screws  - Orthopedic Surgery consulted -Patient underwent ORIF left distal femur fracture, prophylactic nailing of left intertrochanteric femur, removal of hardware left  femur -- Cont with analgesia as needed   2. Type II DM  - A1c was 7.5% in February 2023  - cont SSI  -CBG mildly elevated -Change sliding scale to sensitive   3. Hyponatremia   - Chronic, appears stable, attributed to SIADH and recently started on salt tabs   - Cont fluid restricted diet   4. CKD IIIa  - Appears stable  - Renally-dose medications    5. Hypothyroidism  - On Synthroid     6. Hypertension  - Hold ACE-i perioperatively, treat as-needed only for now  -BP stable and controlled at this time  7.  Anemia -Hemoglobin is 7.7 -Patient does have chronic anemia -Transfuse for hemoglobin less than 7      Medications     sodium chloride   Intravenous Once   amLODipine  5 mg Oral Daily   cholecalciferol  2,000 Units Oral Daily   [START ON 07/25/2021] enoxaparin (LOVENOX) injection  30 mg Subcutaneous Q24H   escitalopram  10 mg Oral Daily   feeding supplement  237 mL Oral BID BM   ferrous sulfate  325 mg Oral Daily   insulin aspart  0-9 Units Subcutaneous TID WC   levothyroxine  25 mcg Oral QAC breakfast   lisinopril  5 mg Oral Daily   multivitamin with minerals  1 tablet Oral Daily   pantoprazole  40 mg Oral Daily   senna  1 tablet Oral QHS   simvastatin  40 mg Oral QHS     Data Reviewed:   CBG:  Recent Labs  Lab 07/23/21 1637 07/23/21 1949 07/24/21 0754 07/24/21 1126 07/24/21 1649  GLUCAP 310* 215*  203* 214* 218*    SpO2: 95 % O2 Flow Rate (L/min): 2 L/min    Vitals:   07/23/21 1948 07/24/21 0451 07/24/21 0757 07/24/21 1427  BP: (!) 152/81 (!) 164/64 (!) 165/65 (!) 122/57  Pulse: 94 92 91 87  Resp: '16 15 18 16  '$ Temp: 98.8 F (37.1 C) 98.1 F (36.7 C) 98.2 F (36.8 C) 98.9 F (37.2 C)  TempSrc:   Oral Oral  SpO2: 99% 97% 100% 95%      Data Reviewed:  Basic Metabolic Panel: Recent Labs  Lab 07/21/21 1540 07/22/21 0154 07/23/21 0149 07/24/21 0318  NA 127* 130* 127* 129*  K 4.6 4.2 4.2 4.4  CL 92* 95* 93* 94*  CO2 '24 26 25 26   '$ GLUCOSE 159* 135* 131* 184*  BUN 20 22 25* 23  CREATININE 1.02* 0.96 1.10* 1.01*  CALCIUM 9.2 8.9 8.7* 8.5*    CBC: Recent Labs  Lab 07/21/21 1540 07/22/21 0154 07/23/21 0149 07/24/21 0318  WBC 11.1* 7.6 7.3 12.0*  HGB 8.5* 7.5* 7.5* 7.7*  HCT 26.0* 21.9* 21.5* 22.9*  MCV 93.5 92.0 90.3 92.0  PLT 519* 451* 474* 512*    LFT No results for input(s): "AST", "ALT", "ALKPHOS", "BILITOT", "PROT", "ALBUMIN" in the last 168 hours.   Antibiotics: Anti-infectives (From admission, onward)    Start     Dose/Rate Route Frequency Ordered Stop   07/23/21 1700  ceFAZolin (ANCEF) IVPB 2g/100 mL premix        2 g 200 mL/hr over 30 Minutes Intravenous Every 12 hours 07/23/21 1233 07/24/21 0542   07/23/21 1008  vancomycin (VANCOCIN) powder  Status:  Discontinued          As needed 07/23/21 1009 07/23/21 1056        DVT prophylaxis: Lovenox  Code Status: Full code  Family Communication: No family at bedside   CONSULTS orthopedics   Objective    Physical Examination:   General-appears in no acute distress Heart-S1-S2, regular, no murmur auscultated Lungs-clear to auscultation bilaterally, no wheezing or crackles auscultated Abdomen-soft, nontender, no organomegaly Extremities-no edema in the lower extremities Neuro-alert, oriented x3, no focal deficit noted  Status is: Inpatient:             Lisa Mills   [facility] If 7PM-7AM, please contact night-coverage at [email or web address], Office  [phone]   07/24/2021, 6:03 PM  LOS: 3 days

## 2021-07-24 NOTE — NC FL2 (Signed)
[place] MEDICAID FL2 LEVEL OF CARE SCREENING TOOL     IDENTIFICATION  Patient Name: Lisa Mills Birthdate: 11-05-1929 Sex: female Admission Date (Current Location): 07/21/2021  [facility] and [place] Number:  Herbalist and Address:  [facility] [place] [place] [address] [place], [place] [address]      Provider Number: [id number]  Attending Physician Name and Address:  Oswald Hillock, MD  Relative Name and Phone Number:  Danella Penton [phone]    Current Level of Care: Hospital Recommended Level of Care: [place] Prior Approval Number:    Date Approved/Denied:   PASRR Number: [id number]  Discharge Plan: SNF    Current Diagnoses: Patient Active Problem List   Diagnosis Date Noted   Closed fracture of left femur, unspecified fracture morphology, initial encounter ([place]) 07/21/2021   Malnutrition of moderate degree 07/15/2021   Essential hypertension 07/13/2021   Femur fracture, left ([place]) 07/13/2021   Chronic hyponatremia 07/13/2021   Femur fracture ([place]) 07/12/2021   Right wrist drop 03/25/2020   Psychophysiological insomnia 10/06/2019   Nonintractable headache 10/06/2019   Urinary incontinence 10/06/2019   Internal and external bleeding hemorrhoids 08/15/2019   Sacral pain    Radicular pain of sacrum 07/11/2019   Sacral fracture, closed ([place]) 07/04/2019   Fall 07/04/2019   Bilateral pubic rami fractures, sequela 07/04/2019   Background diabetic retinopathy ([place]) 01/23/2019   Schatzki's ring 03/04/2017   Post herpetic neuralgia 10/06/2016   Hx of colonic polyps 09/22/2016   Osteopenia 09/02/2016   Anxiety associated with depression 08/25/2016   Trapezius muscle spasm 05/25/2016   Intermittent left lower quadrant abdominal pain 03/16/2016   Anemia in chronic kidney disease (CKD) 02/26/2016   Type 2 diabetes with stage 3 chronic kidney disease GFR 30-59 ([place]) 02/25/2016   Hypothyroidism 02/25/2016    Constipation 02/25/2016   GERD (gastroesophageal reflux disease) 02/25/2016   Hyperlipidemia associated with type 2 diabetes mellitus ([place]) 02/25/2016   DJD (degenerative joint disease) of cervical spine 02/25/2016   Osteoarthritis of multiple joints 02/25/2016   Hiatal hernia 02/25/2016   CKD stage 3 due to type 2 diabetes mellitus ([place]) 02/25/2016   Stage 3a chronic kidney disease (CKD) ([place]) 09/05/2013   Benign hypertension with CKD (chronic kidney disease) stage III (H[facility]) 09/05/2013   CMC arthritis, thumb, degenerative 06/15/2013   Onychomycosis due to dermatophyte 05/25/2013   Acquired keratoderma 05/25/2013    Orientation RESPIRATION BLADDER Height & Weight     Self, Time, Situation, Place  Normal Continent, External catheter Weight:   Height:     BEHAVIORAL SYMPTOMS/MOOD NEUROLOGICAL BOWEL NUTRITION STATUS      Continent Diet (see discharge summary)  AMBULATORY STATUS COMMUNICATION OF NEEDS Skin   Total Care Verbally Other (Comment) (ecchymosis)                       Personal Care Assistance Level of Assistance  Bathing, Feeding, Dressing Bathing Assistance: Maximum assistance Feeding assistance: Limited assistance Dressing Assistance: Maximum assistance     Functional Limitations Info  Sight Sight Info: Adequate Hearing Info: Adequate Speech Info: Adequate    SPECIAL CARE FACTORS FREQUENCY  PT (By licensed PT), OT (By licensed OT)     PT Frequency: 5x week OT Frequency: 5x week            Contractures Contractures Info: Not present    Additional Factors Info  Code Status, Allergies, Insulin Sliding Scale Code Status Info: full Allergies Info: Aspirin,  Conray (Iothalamate), Dye Fdc Red (Red Dye), Sulfa Antibiotics, Sulfasalazine, Prednisone   Insulin Sliding Scale Info: Novolog, 0-9 units TID with meals, see discharge summary       Current Medications (07/24/2021):  This is the current hospital active medication list Current Facility-Administered  Medications  Medication Dose Route Frequency Provider Last Rate Last Admin   0.9 %  sodium chloride infusion (Manually program via Guardrails IV Fluids)   Intravenous Once Rushie Nyhan A, PA-C       0.9 %  sodium chloride infusion   Intravenous Continuous Corinne Ports, PA-C 50 mL/hr at 07/23/21 1239 New Bag at 07/23/21 1239   acetaminophen (TYLENOL) tablet 325-650 mg  325-650 mg Oral Q6H PRN Corinne Ports, PA-C       amLODipine (NORVASC) tablet 5 mg  5 mg Oral Daily Oswald Hillock, MD   5 mg at 07/24/21 1146   cholecalciferol (VITAMIN D) tablet 2,000 Units  2,000 Units Oral Daily Oswald Hillock, MD   2,000 Units at 07/24/21 0824   [START ON 07/25/2021] enoxaparin (LOVENOX) injection 30 mg  30 mg Subcutaneous Q24H Pierce, Dwayne A, RPH       escitalopram (LEXAPRO) tablet 10 mg  10 mg Oral Daily Oswald Hillock, MD   10 mg at 07/24/21 0824   feeding supplement (ENSURE ENLIVE / ENSURE PLUS) liquid 237 mL  237 mL Oral BID BM Oswald Hillock, MD   237 mL at 07/24/21 1500   fentaNYL (SUBLIMAZE) injection 12.5-25 mcg  12.5-25 mcg Intravenous Q2H PRN Corinne Ports, PA-C   25 mcg at 07/23/21 1400   ferrous sulfate tablet 325 mg  325 mg Oral Daily Oswald Hillock, MD   325 mg at 07/24/21 0824   HYDROcodone-acetaminophen (NORCO) 7.5-325 MG per tablet 1-2 tablet  1-2 tablet Oral Q4H PRN Corinne Ports, PA-C       HYDROcodone-acetaminophen (NORCO/VICODIN) 5-325 MG per tablet 1-2 tablet  1-2 tablet Oral Q4H PRN Corinne Ports, PA-C   2 tablet at 07/24/21 1146   insulin aspart (novoLOG) injection 0-9 Units  0-9 Units Subcutaneous TID WC Oswald Hillock, MD   3 Units at 07/24/21 1146   labetalol (NORMODYNE) injection 10 mg  10 mg Intravenous Q4H PRN Oswald Hillock, MD       levothyroxine (SYNTHROID) tablet 25 mcg  25 mcg Oral QAC breakfast Oswald Hillock, MD   25 mcg at 07/24/21 0511   lisinopril (ZESTRIL) tablet 5 mg  5 mg Oral Daily Oswald Hillock, MD   5 mg at 07/24/21 7829   metoCLOPramide (REGLAN) tablet  5-10 mg  5-10 mg Oral Q8H PRN Corinne Ports, PA-C       Or   metoCLOPramide (REGLAN) injection 5-10 mg  5-10 mg Intravenous Q8H PRN McClung, Sarah A, PA-C       multivitamin with minerals tablet 1 tablet  1 tablet Oral Daily Oswald Hillock, MD   1 tablet at 07/24/21 0824   ondansetron (ZOFRAN) tablet 4 mg  4 mg Oral Q6H PRN Corinne Ports, PA-C       Or   ondansetron (ZOFRAN) injection 4 mg  4 mg Intravenous Q6H PRN McClung, Sarah A, PA-C       pantoprazole (PROTONIX) EC tablet 40 mg  40 mg Oral Daily Oswald Hillock, MD   40 mg at 07/24/21 0824   polyethylene glycol (MIRALAX / GLYCOLAX) packet 17 g  17 g Oral Daily PRN McClung,  Sarah A, PA-C       senna (SENOKOT) tablet 8.6 mg  1 tablet Oral Daily PRN Oswald Hillock, MD       senna [facility]) tablet 8.6 mg  1 tablet Oral QHS Oswald Hillock, MD   8.6 mg at 07/23/21 2056   simvastatin (ZOCOR) tablet 40 mg  40 mg Oral QHS Oswald Hillock, MD   40 mg at 07/23/21 2056     Discharge Medications: Please see discharge summary for a list of discharge medications.  Relevant Imaging Results:  Relevant Lab Results:   Additional Information SSN:[id number], pt is vaccinated for covid with multiple boosters  Fleurette Woolbright, Veronia Beets, LCSW

## 2021-07-24 NOTE — Progress Notes (Signed)
Orthopaedic Trauma Progress Note  SUBJECTIVE: Doing well this morning, pain much better than preoperatively.  Has not been up out of bed yet since surgery.  No chest pain. No SOB. No nausea/vomiting. No other complaints.   OBJECTIVE:  Vitals:   07/23/21 1948 07/24/21 0451  BP: (!) 152/81 (!) 164/64  Pulse: 94 92  Resp: 16 15  Temp: 98.8 F (37.1 C) 98.1 F (36.7 C)  SpO2: 99% 97%    General: Resting in bed comfortably, no acute distress Respiratory: No increased work of breathing.  Left lower extremity: Dressing clean, dry, intact.  Tenderness throughout the thigh as expected.  Less tender throughout the lower leg, ankle, foot.  Tolerates ankle DF/PF.  Endorses sensation light touch over all aspects of the toes.  Compartment soft and compressible.  Toes warm and well-perfused  IMAGING: Stable post op imaging.   LABS:  Results for orders placed or performed during the hospital encounter of 07/21/21 (from the past 24 hour(s))  Glucose, capillary     Status: Abnormal   Collection Time: 07/23/21  7:28 AM  Result Value Ref Range   Glucose-Capillary 129 (H) 70 - 99 mg/dL  Glucose, capillary     Status: Abnormal   Collection Time: 07/23/21 11:01 AM  Result Value Ref Range   Glucose-Capillary 148 (H) 70 - 99 mg/dL  Glucose, capillary     Status: Abnormal   Collection Time: 07/23/21 12:34 PM  Result Value Ref Range   Glucose-Capillary 152 (H) 70 - 99 mg/dL  Glucose, capillary     Status: Abnormal   Collection Time: 07/23/21  4:37 PM  Result Value Ref Range   Glucose-Capillary 310 (H) 70 - 99 mg/dL  Glucose, capillary     Status: Abnormal   Collection Time: 07/23/21  7:49 PM  Result Value Ref Range   Glucose-Capillary 215 (H) 70 - 99 mg/dL  CBC     Status: Abnormal   Collection Time: 07/24/21  3:18 AM  Result Value Ref Range   WBC 12.0 (H) 4.0 - 10.5 K/uL   RBC 2.49 (L) 3.87 - 5.11 MIL/uL   Hemoglobin 7.7 (L) 12.0 - 15.0 g/dL   HCT 22.9 (L) 36.0 - 46.0 %   MCV 92.0 80.0 -  100.0 fL   MCH 30.9 26.0 - 34.0 pg   MCHC 33.6 30.0 - 36.0 g/dL   RDW 13.3 11.5 - 15.5 %   Platelets 512 (H) 150 - 400 K/uL   nRBC 0.0 0.0 - 0.2 %  Basic metabolic panel     Status: Abnormal   Collection Time: 07/24/21  3:18 AM  Result Value Ref Range   Sodium 129 (L) 135 - 145 mmol/L   Potassium 4.4 3.5 - 5.1 mmol/L   Chloride 94 (L) 98 - 111 mmol/L   CO2 26 22 - 32 mmol/L   Glucose, Bld 184 (H) 70 - 99 mg/dL   BUN 23 8 - 23 mg/dL   Creatinine, Ser 1.01 (H) 0.44 - 1.00 mg/dL   Calcium 8.5 (L) 8.9 - 10.3 mg/dL   GFR, Estimated 53 (L) >60 mL/min   Anion gap 9 5 - 15    ASSESSMENT: Lisa Mills is a 88 y.o. female, 1 Day Post-Op s/p ORIF LEFT DISTAL FEMUR FRACTURE INTRAMEDULLARY NAIL LEFT FEMUR HARDWARE REMOVAL LEFT FEMUR  CV/Blood loss: Hgb 7.7 this AM, stable from pre-op. Hemodynamically stable  PLAN: Weightbearing: WBAT RLE ROM: Okay for unrestricted ROM Incisional and dressing care: Plan to remove dressing tomorrow 07/25/2021  Showering: Okay to begin showering getting incisions wet 07/26/2021 Orthopedic device(s): None  Pain management:  1. Tylenol 325-650 mg q 6 hours PRN 2. Robaxin 500 mg q 6 hours PRN 3. Norco 5-325 mg q 4 hours PRN OR Norco 7.5-325 mg q 4 hours PRN 4. Dilaudid 1 mg q 3 hours PRN VTE prophylaxis: Lovenox, SCDs ID:  Ancef 2gm post op Foley/Lines:  No foley, KVO IVFs Impediments to Fracture Healing: Vitamin D level pending, will start supplementation as indicated Dispo: PT/OT today, dispo pending.  Will likely need to return to SNF at discharge.  Plan to remove dressing tomorrow.    D/C recommendations: -Norco for pain control -Eliquis for DVT prophylaxis -Possible need for Vit D supplementation  Follow - up plan: 2 weeks after discharge for wound check and repeat x-rays   Contact information:  Katha Hamming MD, Rushie Nyhan PA-C. After hours and holidays please check [email or web address] for group call information for [facility]   Gwinda Passe, PA-C [phone] (office) [email or web address]

## 2021-07-24 NOTE — Progress Notes (Signed)
Physical Therapy Treatment Patient Details Name: Lisa Mills MRN: [id number] DOB: 23-May-1929 Today's Date: 07/24/2021   History of Present Illness Patient is an 88 year old female with a history of HTN, CKD, anemia, DM, hypothyroidism, HLD, admitted 07/21/21 for left femur periprosthetic fracture while in SNF. Recently hospitalized 6/24-6/27 for fall, left femur fracture and ORIF. Underwent hardware removal, ORIF, and Prophylactic nailing of left intertrochanteric femur on 7/5.    PT Comments    Making progress towards functional goals. Tolerated transfers with less physical assistance today. Participated in pre-gait training, unable to take a full step yet due to limited WB tolerance through LLE and UE weakness on walker. Good response to LE strength and ROM activities. Remains motivated.  Patient will continue to benefit from skilled physical therapy services to further improve independence with functional mobility.   Recommendations for follow up therapy are one component of a multi-disciplinary discharge planning process, led by the attending physician.  Recommendations may be updated based on patient status, additional functional criteria and insurance authorization.  Follow Up Recommendations  Skilled nursing-short term rehab (<3 hours/day) Can patient physically be transported by private vehicle: No   Assistance Recommended at Discharge Frequent or constant Supervision/Assistance  Patient can return home with the following A lot of help with bathing/dressing/bathroom;Assistance with cooking/housework;Assist for transportation;Help with stairs or ramp for entrance;Two people to help with walking and/or transfers   Equipment Recommendations  None recommended by PT    Recommendations for Other Services       Precautions / Restrictions Precautions Precautions: Fall Restrictions Weight Bearing Restrictions: Yes LLE Weight Bearing: Weight bearing as tolerated     Mobility  Bed  Mobility Overal bed mobility: Needs Assistance Bed Mobility: Supine to Sit     Supine to sit: Mod assist     General bed mobility comments: Mod assist for LLE out of bed, to pull through therapist hand to bring trunk up and scoot to EOB.    Transfers Overall transfer level: Needs assistance Equipment used: Rolling walker (2 wheels) Transfers: Sit to/from Stand, Bed to chair/wheelchair/BSC Sit to Stand: Min assist Stand pivot transfers: Mod assist         General transfer comment: Min assist for boost to stand, practiced from bed x 3 and chair today. Improved strength and control with ascent to rise and balance with RW use upon standing, minimal use of LLE to facilitate rise. Cues for technique. Mod assist for pivot towards left into chair, assisting with balance and RW control, unable to fully clear RLE.    Ambulation/Gait             Pre-gait activities: Pre gait standing and weight shifting, tolerated several bouts with practice using RW to lift Rt foot from floor however poor WB tolerance on LLE at this time, and weak UEs prevent ambulation yet.     Stairs             Wheelchair Mobility    Modified Rankin (Stroke Patients Only)       Balance Overall balance assessment: Needs assistance Sitting-balance support: Bilateral upper extremity supported, Feet supported Sitting balance-Leahy Scale: Good     Standing balance support: Bilateral upper extremity supported, Reliant on assistive device for balance, During functional activity Standing balance-Leahy Scale: Poor                              Cognition Arousal/Alertness: Awake/alert Behavior During Therapy: WFL for tasks  assessed/performed Overall Cognitive Status: Within Functional Limits for tasks assessed                                          Exercises General Exercises - Lower Extremity Ankle Circles/Pumps: AROM, Both, 10 reps, Supine Quad Sets: Strengthening,  Left, 10 reps, Supine Gluteal Sets: Strengthening, Left, 10 reps, Supine Long Arc Quad: Strengthening, Left, 10 reps, Seated Heel Slides: AROM, Left, 10 reps, Supine Hip ABduction/ADduction: Strengthening, Both, 10 reps, Seated Straight Leg Raises: AAROM, Left, 10 reps, Seated Heel Raises: AROM, Both, 10 reps, Seated    General Comments        Pertinent Vitals/Pain Pain Assessment Pain Assessment: Faces Faces Pain Scale: Hurts little more Pain Location: Left hip Pain Descriptors / Indicators: Dull, Discomfort, Aching, Grimacing Pain Intervention(s): Limited activity within patient's tolerance, Monitored during session, Repositioned, RN gave pain meds during session    Home Living                          Prior Function            PT Goals (current goals can now be found in the care plan section) Acute Rehab PT Goals Patient Stated Goal: pt would like to get back to walking PT Goal Formulation: With patient Time For Goal Achievement: 07/30/21 Potential to Achieve Goals: Good Progress towards PT goals: Progressing toward goals    Frequency    Min 3X/week      PT Plan Current plan remains appropriate    Co-evaluation              AM-PAC PT "6 Clicks" Mobility   Outcome Measure  Help needed turning from your back to your side while in a flat bed without using bedrails?: A Little Help needed moving from lying on your back to sitting on the side of a flat bed without using bedrails?: A Little Help needed moving to and from a bed to a chair (including a wheelchair)?: A Lot Help needed standing up from a chair using your arms (e.g., wheelchair or bedside chair)?: A Lot Help needed to walk in hospital room?: A Lot Help needed climbing 3-5 steps with a railing? : Total 6 Click Score: 13    End of Session Equipment Utilized During Treatment: Gait belt Activity Tolerance: Patient tolerated treatment well Patient left: in chair;with call bell/phone  within reach;with chair alarm set Nurse Communication: Mobility status;Other (comment) ([place] off) PT Visit Diagnosis: Other abnormalities of gait and mobility (R26.89);Difficulty in walking, not elsewhere classified (R26.2);Muscle weakness (generalized) (M62.81);Pain;History of falling (Z91.81);Unsteadiness on feet (R26.81) Pain - Right/Left: Left Pain - part of body: Hip     Time: 9983-3825 PT Time Calculation (min) (ACUTE ONLY): 25 min  Charges:  $Therapeutic Exercise: 8-22 mins $Therapeutic Activity: 8-22 mins                     Candie Mile, PT    Ellouise Newer 07/24/2021, 1:30 PM

## 2021-07-24 NOTE — Care Management Important Message (Signed)
Important Message  Patient Details  Name: Lisa Mills MRN: [id number] Date of Birth: 12/01/1929   Medicare Important Message Given:  Yes     Orbie Pyo 07/24/2021, 3:21 PM

## 2021-07-24 NOTE — TOC Initial Note (Signed)
Transition of Care [facility]) - Initial/Assessment Note    Patient Details  Name: Lisa Mills MRN: [id number] Date of Birth: 10-14-1929  Transition of Care [facility]) CM/SW Contact:    Joanne Chars, LCSW Phone Number: 07/24/2021, 3:11 PM  Clinical Narrative:    CSW met with pt and son [name] to discuss DC recommendation for SNF.  Permission given to speak with son present. Pt just DC from [facility] after a femur fracture and spent a few days at [facility] prior to readmission.  Pt does want to return to [facility] to continue rehab there.    Pt usually lives at home alone.  Pt is vaccinated for covid with multiple boosters.  CSW confirmed with [facility] that pt can return.              Expected Discharge Plan: Skilled Nursing Facility Barriers to Discharge: Continued Medical Work up   Patient Goals and CMS Choice Patient states their goals for this hospitalization and ongoing recovery are:: walk and go home   Choice offered to / list presented to : Patient, Adult Children (pt and son want pt to return to [facility])  Expected Discharge Plan and Services Expected Discharge Plan: [place] In-house Referral: Clinical Social Work   Post Acute Care Choice: [place] Living arrangements for the past 2 months: [place]                                      Prior Living Arrangements/Services Living arrangements for the past 2 months: Single Family Home Lives with:: Self Patient language and need for interpreter reviewed:: Yes Do you feel safe going back to the place where you live?: Yes      Need for Family Participation in Patient Care: Yes (Comment) Care giver support system in place?: Yes (comment) Current home services: Other (comment) (none) Criminal Activity/Legal Involvement Pertinent to Current Situation/Hospitalization: No - Comment as needed  Activities of Daily Living Home Assistive  Devices/Equipment: Walker (specify type) ADL Screening (condition at time of admission) Patient's cognitive ability adequate to safely complete daily activities?: Yes Is the patient deaf or have difficulty hearing?: Yes Does the patient have difficulty seeing, even when wearing glasses/contacts?: No Does the patient have difficulty concentrating, remembering, or making decisions?: No Patient able to express need for assistance with ADLs?: Yes Does the patient have difficulty dressing or bathing?: Yes Independently performs ADLs?: No Communication: Independent Does the patient have difficulty walking or climbing stairs?: Yes Weakness of Legs: Left Weakness of Arms/Hands: Right  Permission Sought/Granted Permission sought to share information with : Family Supports Permission granted to share information with : Yes, Verbal Permission Granted  Share Information with NAME: son [name]  Permission granted to share info w AGENCY: SNF        Emotional Assessment Appearance:: Appears stated age Attitude/Demeanor/Rapport: Engaged Affect (typically observed): Appropriate, Pleasant Orientation: : Oriented to Self, Oriented to Place, Oriented to  Time, Oriented to Situation Alcohol / Substance Use: Not Applicable Psych Involvement: No (comment)  Admission diagnosis:  Closed fracture of left femur, unspecified fracture morphology, initial encounter ([place]) [S72.92XA] Patient Active Problem List   Diagnosis Date Noted   Closed fracture of left femur, unspecified fracture morphology, initial encounter ([place]) 07/21/2021   Malnutrition of moderate degree 07/15/2021   Essential hypertension 07/13/2021   Femur fracture, left ([place]) 07/13/2021   Chronic  hyponatremia 07/13/2021   Femur fracture (HCC) 07/12/2021   Right wrist drop 03/25/2020   Psychophysiological insomnia 10/06/2019   Nonintractable headache 10/06/2019   Urinary incontinence 10/06/2019   Internal and external bleeding hemorrhoids  08/15/2019   Sacral pain    Radicular pain of sacrum 07/11/2019   Sacral fracture, closed ([place]) 07/04/2019   Fall 07/04/2019   Bilateral pubic rami fractures, sequela 07/04/2019   Background diabetic retinopathy ([place]) 01/23/2019   Schatzki's ring 03/04/2017   Post herpetic neuralgia 10/06/2016   Hx of colonic polyps 09/22/2016   Osteopenia 09/02/2016   Anxiety associated with depression 08/25/2016   Trapezius muscle spasm 05/25/2016   Intermittent left lower quadrant abdominal pain 03/16/2016   Anemia in chronic kidney disease (CKD) 02/26/2016   Type 2 diabetes with stage 3 chronic kidney disease GFR 30-59 ([place]) 02/25/2016   Hypothyroidism 02/25/2016   Constipation 02/25/2016   GERD (gastroesophageal reflux disease) 02/25/2016   Hyperlipidemia associated with type 2 diabetes mellitus ([place]) 02/25/2016   DJD (degenerative joint disease) of cervical spine 02/25/2016   Osteoarthritis of multiple joints 02/25/2016   Hiatal hernia 02/25/2016   CKD stage 3 due to type 2 diabetes mellitus ([place]) 02/25/2016   Stage 3a chronic kidney disease (CKD) ([place]) 09/05/2013   Benign hypertension with CKD (chronic kidney disease) stage III ([place]) 09/05/2013   CMC arthritis, thumb, degenerative 06/15/2013   Onychomycosis due to dermatophyte 05/25/2013   Acquired keratoderma 05/25/2013   PCP:  Olin Hauser, DO Pharmacy:  No Pharmacies Listed    Social Determinants of Health (SDOH) Interventions    Readmission Risk Interventions     No data to display

## 2021-07-25 DIAGNOSIS — E038 Other specified hypothyroidism: Secondary | ICD-10-CM | POA: Diagnosis not present

## 2021-07-25 DIAGNOSIS — E871 Hypo-osmolality and hyponatremia: Secondary | ICD-10-CM | POA: Diagnosis not present

## 2021-07-25 DIAGNOSIS — Z9889 Other specified postprocedural states: Secondary | ICD-10-CM | POA: Diagnosis not present

## 2021-07-25 DIAGNOSIS — S7292XA Unspecified fracture of left femur, initial encounter for closed fracture: Secondary | ICD-10-CM | POA: Diagnosis not present

## 2021-07-25 LAB — GLUCOSE, CAPILLARY
Glucose-Capillary: 173 mg/dL — ABNORMAL HIGH (ref 70–99)
Glucose-Capillary: 176 mg/dL — ABNORMAL HIGH (ref 70–99)
Glucose-Capillary: 184 mg/dL — ABNORMAL HIGH (ref 70–99)
Glucose-Capillary: 209 mg/dL — ABNORMAL HIGH (ref 70–99)
Glucose-Capillary: 240 mg/dL — ABNORMAL HIGH (ref 70–99)
Glucose-Capillary: 263 mg/dL — ABNORMAL HIGH (ref 70–99)

## 2021-07-25 LAB — CBC
HCT: 20.6 % — ABNORMAL LOW (ref 36.0–46.0)
Hemoglobin: 7 g/dL — ABNORMAL LOW (ref 12.0–15.0)
MCH: 31.3 pg (ref 26.0–34.0)
MCHC: 34 g/dL (ref 30.0–36.0)
MCV: 92 fL (ref 80.0–100.0)
Platelets: 456 10*3/uL — ABNORMAL HIGH (ref 150–400)
RBC: 2.24 MIL/uL — ABNORMAL LOW (ref 3.87–5.11)
RDW: 13.4 % (ref 11.5–15.5)
WBC: 12.4 10*3/uL — ABNORMAL HIGH (ref 4.0–10.5)
nRBC: 0 % (ref 0.0–0.2)

## 2021-07-25 LAB — BASIC METABOLIC PANEL
Anion gap: 8 (ref 5–15)
BUN: 23 mg/dL (ref 8–23)
CO2: 27 mmol/L (ref 22–32)
Calcium: 8.5 mg/dL — ABNORMAL LOW (ref 8.9–10.3)
Chloride: 94 mmol/L — ABNORMAL LOW (ref 98–111)
Creatinine, Ser: 1.07 mg/dL — ABNORMAL HIGH (ref 0.44–1.00)
GFR, Estimated: 50 mL/min — ABNORMAL LOW (ref >60)
Glucose, Bld: 182 mg/dL — ABNORMAL HIGH (ref 70–99)
Potassium: 4.6 mmol/L (ref 3.5–5.1)
Sodium: 129 mmol/L — ABNORMAL LOW (ref 135–145)

## 2021-07-25 MED ORDER — HYDROCODONE-ACETAMINOPHEN 5-325 MG PO TABS: 1.0000 | ORAL_TABLET | ORAL | 0 refills | Status: DC | PRN

## 2021-07-25 MED ORDER — ATORVASTATIN CALCIUM 10 MG PO TABS
20.0000 mg | ORAL_TABLET | Freq: Every day | ORAL | Status: DC
  Administered 2021-07-25 – 2021-07-28 (×4): 20 mg via ORAL
  Filled 2021-07-25 (×3): qty 2

## 2021-07-25 MED ORDER — APIXABAN 2.5 MG PO TABS: 2.5000 mg | ORAL_TABLET | Freq: Two times a day (BID) | ORAL | 0 refills | Status: DC

## 2021-07-25 NOTE — Progress Notes (Signed)
Physical Therapy Treatment Patient Details Name: Lisa Mills MRN: [id number] DOB: 04/29/1929 Today's Date: 07/25/2021   History of Present Illness Patient is an 88 year old female with a history of HTN, CKD, anemia, DM, hypothyroidism, HLD, admitted 07/21/21 for left femur periprosthetic fracture while in SNF. Recently hospitalized 6/24-6/27 for fall, left femur fracture and ORIF. Underwent hardware removal, ORIF, and Prophylactic nailing of left intertrochanteric femur on 7/5.    PT Comments    Today's skilled session continued to focus on strengthening and mobility with no issues noted or reported. Was noted that pt's Hgb was 7.0 today, pt denied any symptoms with mobility. The pt is progressing toward goals. Acute PT to continue during pt's hospital stay.    Recommendations for follow up therapy are one component of a multi-disciplinary discharge planning process, led by the attending physician.  Recommendations may be updated based on patient status, additional functional criteria and insurance authorization.  Follow Up Recommendations  Skilled nursing-short term rehab (<3 hours/day) Can patient physically be transported by private vehicle: No   Assistance Recommended at Discharge Frequent or constant Supervision/Assistance  Equipment Recommendations  None recommended by PT    Precautions / Restrictions Precautions Precautions: Fall Restrictions LLE Weight Bearing: Weight bearing as tolerated     Mobility  Bed Mobility Overal bed mobility: Needs Assistance Bed Mobility: Supine to Sit     Supine to sit: Min assist, HOB elevated     General bed mobility comments: with HOB 30 degrees- occasional assistance to move left LE toward/off edge of bed, pt lifiting hips up to scoot closer to edge prior to using rails to elevate trunk with min HHA on other side.    Transfers Overall transfer level: Needs assistance Equipment used: Rolling walker (2 wheels) Transfers: Sit to/from  Stand, Bed to chair/wheelchair/BSC Sit to Stand: Min assist   Step pivot transfers: Mod assist       General transfer comment: min assist to stand from low bed>RW with cues on hand placement/weight shifting. once standing pt was able to take 3-4 pivot steps from, bed toward recliner chair with mod assist. limited weight bearing tolerance on left LE in stance with decreased right step length. mod assist for controlled descent into chair with cues on hand placement. pt was then able to self scoot back into the chair.         Cognition Arousal/Alertness: Awake/alert Behavior During Therapy: WFL for tasks assessed/performed Overall Cognitive Status: Within Functional Limits for tasks assessed                   Exercises General Exercises - Lower Extremity Ankle Circles/Pumps: AROM, Strengthening, Both, 10 reps, Supine Quad Sets: AROM, Strengthening, Left, 10 reps, Supine Heel Slides: AAROM, Strengthening, Left, 10 reps, Supine Straight Leg Raises: AAROM, Strengthening, Left, 10 reps, Supine     Pertinent Vitals/Pain Pain Assessment Pain Assessment: 0-10 Pain Score: 5  Pain Location: Left hip with movement, none at rest Pain Descriptors / Indicators: Dull, Discomfort, Aching, Grimacing Pain Intervention(s): Limited activity within patient's tolerance, Monitored during session, Premedicated before session, Repositioned     PT Goals (current goals can now be found in the care plan section) Acute Rehab PT Goals Patient Stated Goal: pt would like to get back to walking PT Goal Formulation: With patient Time For Goal Achievement: 07/30/21 Potential to Achieve Goals: Good Progress towards PT goals: Progressing toward goals    Frequency    Min 3X/week      PT Plan Current  plan remains appropriate    AM-PAC PT "6 Clicks" Mobility   Outcome Measure  Help needed turning from your back to your side while in a flat bed without using bedrails?: A Little Help needed  moving from lying on your back to sitting on the side of a flat bed without using bedrails?: A Little Help needed moving to and from a bed to a chair (including a wheelchair)?: A Lot Help needed standing up from a chair using your arms (e.g., wheelchair or bedside chair)?: A Lot Help needed to walk in hospital room?: A Lot Help needed climbing 3-5 steps with a railing? : Total 6 Click Score: 13    End of Session Equipment Utilized During Treatment: Gait belt Activity Tolerance: Patient tolerated treatment well;No increased pain Patient left: in chair;with call bell/phone within reach;with chair alarm set Nurse Communication: Mobility status PT Visit Diagnosis: Other abnormalities of gait and mobility (R26.89);Difficulty in walking, not elsewhere classified (R26.2);Muscle weakness (generalized) (M62.81);Pain;History of falling (Z91.81);Unsteadiness on feet (R26.81) Pain - Right/Left: Left Pain - part of body: Hip     Time: 1449-1510 PT Time Calculation (min) (ACUTE ONLY): 21 min  Charges:  $Therapeutic Activity: 8-22 mins                    [name] Ora, PTA, [facility] Acute Rehab Services [phone] 07/25/21, 3:30 PM    [name] Ora 07/25/2021, 3:30 PM

## 2021-07-25 NOTE — TOC Progression Note (Signed)
Transition of Care [facility]) - Progression Note    Patient Details  Name: AARIONA MOMON MRN: [id number] Date of Birth: September 16, 1929  Transition of Care [facility]) CM/SW Contact  Joanne Chars, LCSW Phone Number: 07/25/2021, 3:58 PM  Clinical Narrative:   CSW spoke to [facility].  She says they cannot readmit over the weekend, even though pt was already admitted there.  Leadership notified.  New Auth requested from HTA.    Expected Discharge Plan: [place] Barriers to Discharge: Continued Medical Work up  Expected Discharge Plan and Services Expected Discharge Plan: [place] In-house Referral: Clinical Social Work   Post Acute Care Choice: [place] Living arrangements for the past 2 months: Single Family Home                                       Social Determinants of Health (SDOH) Interventions    Readmission Risk Interventions     No data to display

## 2021-07-25 NOTE — Plan of Care (Signed)

## 2021-07-25 NOTE — Progress Notes (Signed)
[facility]  PROGRESS NOTE  Lisa Mills ZOX:[id number] DOB: Apr 09, 1929 DOA: 07/21/2021 PCP: Olin Hauser, DO   Brief HPI:   88 y.o. female with medical history significant for diet-controlled diabetes mellitus, hypothyroidism, hypertension, recurrent UTIs, CKD 3A, chronic anemia, chronic hyponatremia, and left femoral shaft fracture status post reduction and internal fixation on 07/13/2021, now presenting from her SNF for evaluation of left leg pain.  After the recent hospitalization for surgical repair of the left femoral shaft fracture, patient went to acute rehab facility where she reports progressing quite well until few days ago when she was working with PT, shifted her weight from the right foot to the left, and felt her leg "give out."  Since then, she has had reduced range of motion, pain with movement, and difficulty bearing weight.  She had radiographs performed at the facility and was sent to the ED for further evaluation and management.   In the ED, pt was found to be afebrile and saturating well on room air with stable blood pressure.  Chemistry panel with stable hyponatremia and renal insufficiency.  CBC was stable normocytic anemia and new thrombocytosis.  Radiographs of the left femur demonstrate new fracture of the distal femur fracture fragment with dislocation of the intramedullary rod and loosening of screws.   Subjective   Patient seen and examined, denies any complaints.   Assessment/Plan:     Periprosthetic left femoral shaft fracture  - S/p ORIF on 6/25 now presenting with pain and deformity after feeling the leg "give out" a few days ago PTA - Plain radiographs demonstrate new fracture involving distal fracture fragment with displaced IM rod and loosened screws  - Orthopedic Surgery consulted -Patient underwent ORIF left distal femur fracture, prophylactic nailing of left intertrochanteric femur, removal of hardware left femur -- Cont with  analgesia as needed   2. Type II DM  - A1c was 7.5% in February 2023  - cont SSI  -CBG stable -Continue sensitive sliding scale    3. Hyponatremia   - Chronic, appears stable, attributed to SIADH and recently started on salt tabs   - Cont fluid restricted diet   4. CKD IIIa  - Appears stable  - Renally-dose medications    5. Hypothyroidism  - On Synthroid     6. Hypertension  - Hold ACE-i perioperatively, treat as-needed only for now  -BP stable and controlled at this time  7.  Anemia -Hemoglobin is 7.0 -Likely dilutional, will discontinue IV fluids -Patient does have chronic anemia -Transfuse for hemoglobin less than 7      Medications     sodium chloride   Intravenous Once   amLODipine  5 mg Oral Daily   atorvastatin  20 mg Oral Daily   cholecalciferol  2,000 Units Oral Daily   enoxaparin (LOVENOX) injection  30 mg Subcutaneous Q24H   escitalopram  10 mg Oral Daily   feeding supplement  237 mL Oral BID BM   ferrous sulfate  325 mg Oral Daily   insulin aspart  0-9 Units Subcutaneous TID WC   levothyroxine  25 mcg Oral QAC breakfast   lisinopril  5 mg Oral Daily   multivitamin with minerals  1 tablet Oral Daily   pantoprazole  40 mg Oral Daily   senna  1 tablet Oral QHS     Data Reviewed:   CBG:  Recent Labs  Lab 07/24/21 2142 07/25/21 0840 07/25/21 0943 07/25/21 1200 07/25/21 1613  GLUCAP 203* 173* 263* 209* 184*  SpO2: 99 % O2 Flow Rate (L/min): 2 L/min    Vitals:   07/24/21 2021 07/25/21 0500 07/25/21 1158 07/25/21 1414  BP: 106/79  (!) 127/58   Pulse: 91  86   Resp: 16  14   Temp: 99.5 F (37.5 C)  98.8 F (37.1 C)   TempSrc: Oral     SpO2: 97%  99%   Weight:  58.6 kg  58.6 kg  Height:    '4\' 11"'$  (1.499 m)      Data Reviewed:  Basic Metabolic Panel: Recent Labs  Lab 07/21/21 1540 07/22/21 0154 07/23/21 0149 07/24/21 0318 07/25/21 0206  NA 127* 130* 127* 129* 129*  K 4.6 4.2 4.2 4.4 4.6  CL 92* 95* 93* 94* 94*  CO2  '24 26 25 26 27  '$ GLUCOSE 159* 135* 131* 184* 182*  BUN 20 22 25* 23 23  CREATININE 1.02* 0.96 1.10* 1.01* 1.07*  CALCIUM 9.2 8.9 8.7* 8.5* 8.5*    CBC: Recent Labs  Lab 07/21/21 1540 07/22/21 0154 07/23/21 0149 07/24/21 0318 07/25/21 0206  WBC 11.1* 7.6 7.3 12.0* 12.4*  HGB 8.5* 7.5* 7.5* 7.7* 7.0*  HCT 26.0* 21.9* 21.5* 22.9* 20.6*  MCV 93.5 92.0 90.3 92.0 92.0  PLT 519* 451* 474* 512* 456*    LFT No results for input(s): "AST", "ALT", "ALKPHOS", "BILITOT", "PROT", "ALBUMIN" in the last 168 hours.   Antibiotics: Anti-infectives (From admission, onward)    Start     Dose/Rate Route Frequency Ordered Stop   07/23/21 1700  ceFAZolin (ANCEF) IVPB 2g/100 mL premix        2 g 200 mL/hr over 30 Minutes Intravenous Every 12 hours 07/23/21 1233 07/24/21 0542   07/23/21 1008  vancomycin (VANCOCIN) powder  Status:  Discontinued          As needed 07/23/21 1009 07/23/21 1056        DVT prophylaxis: Lovenox  Code Status: Full code  Family Communication: No family at bedside   CONSULTS orthopedics   Objective    Physical Examination:   General-appears in no acute distress Heart-S1-S2, regular, no murmur auscultated Lungs-clear to auscultation bilaterally, no wheezing or crackles auscultated Abdomen-soft, nontender, no organomegaly Extremities-no edema in the lower extremities Neuro-alert, oriented x3, no focal deficit noted  Status is: Inpatient:             Oswald Hillock   [facility] If 7PM-7AM, please contact night-coverage at [email or web address], Office  [phone]   07/25/2021, 5:44 PM  LOS: 4 days

## 2021-07-25 NOTE — Progress Notes (Signed)
Occupational Therapy Treatment Patient Details Name: Lisa Mills MRN: [id number] DOB: 16-Dec-1929 Today's Date: 07/25/2021   History of present illness Patient is an 88 year old female with a history of HTN, CKD, anemia, DM, hypothyroidism, HLD, admitted 07/21/21 for left femur periprosthetic fracture while in SNF. Recently hospitalized 6/24-6/27 for fall, left femur fracture and ORIF. Underwent hardware removal, ORIF, and Prophylactic nailing of left intertrochanteric femur on 7/5.   OT comments  Pt making steady progress towards OT goals this session. Pt continues to present with increased pain, decreased activity tolerance and generalized deconditioning . Session focus on progressing functional mobility to increased tolerance and participation in ADLs. Pt completed x2 sit<>stands from recliner with MIN A and cues for hand placement. Pt able to take 4 steps forward with Rw and close chair follow with pt presenting with decreased ability to shift weight fully to L side to advance RLE during swing d/t increased pain. pt presenting with more of a shuffled step with RLE. Pt unable to work towards LB ADLs during session d/t pain. Pt would continue to benefit from skilled occupational therapy while admitted and after d/c to address the below listed limitations in order to improve overall functional mobility and facilitate independence with BADL participation. DC plan remains appropriate, will follow acutely per POC.      Recommendations for follow up therapy are one component of a multi-disciplinary discharge planning process, led by the attending physician.  Recommendations may be updated based on patient status, additional functional criteria and insurance authorization.    Follow Up Recommendations  Skilled nursing-short term rehab (<3 hours/day)    Assistance Recommended at Discharge Frequent or constant Supervision/Assistance  Patient can return home with the following  A lot of help with walking  and/or transfers;A lot of help with bathing/dressing/bathroom;Help with stairs or ramp for entrance;Assist for transportation;Assistance with cooking/housework   Equipment Recommendations  None recommended by OT    Recommendations for Other Services      Precautions / Restrictions Precautions Precautions: Fall Restrictions Weight Bearing Restrictions: Yes LLE Weight Bearing: Weight bearing as tolerated       Mobility Bed Mobility               General bed mobility comments: seated in recliner    Transfers Overall transfer level: Needs assistance Equipment used: Rolling walker (2 wheels) Transfers: Sit to/from Stand Sit to Stand: Min assist           General transfer comment: MIN A to rise from recliner, worked on pregait tasks with pt able to take 4 steps forward with close chair follow however pt with decreased ability to shift weight fully to L side to advacne R foot preferring to slide R foot forward. MOD A for controlled descend into recliner d/t decreased BUE strength     Balance Overall balance assessment: Needs assistance Sitting-balance support: Bilateral upper extremity supported, Feet supported Sitting balance-Leahy Scale: Good     Standing balance support: Bilateral upper extremity supported, Reliant on assistive device for balance, During functional activity Standing balance-Leahy Scale: Poor Standing balance comment: reliant on BUE support                           ADL either performed or assessed with clinical judgement   ADL Overall ADL's : Needs assistance/impaired  General ADL Comments: session focus on functional mobility progression for higher level ADL tasks    Extremity/Trunk Assessment Upper Extremity Assessment Upper Extremity Assessment: Generalized weakness   Lower Extremity Assessment Lower Extremity Assessment: Defer to PT evaluation        Vision Patient  Visual Report: No change from baseline     Perception Perception Perception: Within Functional Limits   Praxis Praxis Praxis: Intact    Cognition Arousal/Alertness: Awake/alert Behavior During Therapy: WFL for tasks assessed/performed Overall Cognitive Status: Within Functional Limits for tasks assessed                                          Exercises      Shoulder Instructions       General Comments      Pertinent Vitals/ Pain       Pain Assessment Pain Assessment: Faces Faces Pain Scale: Hurts even more Pain Location: LLE with WB'ing Pain Descriptors / Indicators: Dull, Discomfort, Aching, Grimacing Pain Intervention(s): Limited activity within patient's tolerance, Monitored during session, Repositioned  Home Living                                          Prior Functioning/Environment              Frequency  Min 2X/week        Progress Toward Goals  OT Goals(current goals can now be found in the care plan section)  Progress towards OT goals: Progressing toward goals  Acute Rehab OT Goals Patient Stated Goal: Finish rehab and go back home OT Goal Formulation: With patient Time For Goal Achievement: 08/06/21 Potential to Achieve Goals: Good  Plan Discharge plan remains appropriate;Frequency remains appropriate    Co-evaluation                 AM-PAC OT "6 Clicks" Daily Activity     Outcome Measure   Help from another person eating meals?: None Help from another person taking care of personal grooming?: None Help from another person toileting, which includes using toliet, bedpan, or urinal?: A Lot Help from another person bathing (including washing, rinsing, drying)?: A Lot Help from another person to put on and taking off regular upper body clothing?: None Help from another person to put on and taking off regular lower body clothing?: A Lot 6 Click Score: 18    End of Session Equipment Utilized  During Treatment: Gait belt;Rolling walker (2 wheels)  OT Visit Diagnosis: Unsteadiness on feet (R26.81);Muscle weakness (generalized) (M62.81);Pain Pain - Right/Left: Left Pain - part of body: Hip;Leg   Activity Tolerance Patient tolerated treatment well   Patient Left in chair;with call bell/phone within reach;with chair alarm set   Nurse Communication          Time: 2563-8937 OT Time Calculation (min): 15 min  Charges: OT General Charges $OT Visit: 1 Visit OT Treatments $Therapeutic Activity: 8-22 mins  Harley Alto., COTA/L Acute Rehabilitation Services [phone]   Precious Haws 07/25/2021, 4:13 PM

## 2021-07-25 NOTE — Progress Notes (Signed)
Orthopaedic Trauma Progress Note  SUBJECTIVE: Doing well this morning, pain controlled. No chest pain. No SOB. No nausea/vomiting. No other complaints.   OBJECTIVE:  Vitals:   07/24/21 1427 07/24/21 2021  BP: (!) 122/57 106/79  Pulse: 87 91  Resp: 16 16  Temp: 98.9 F (37.2 C) 99.5 F (37.5 C)  SpO2: 95% 97%    General: Resting in bed comfortably, no acute distress Respiratory: No increased work of breathing.  Left lower extremity: Dressing removed, incisions CDI. Most proximal incision with slight surrounding redness but otherwise stable. Tenderness throughout the thigh as expected.  Less tender throughout the lower leg, ankle, foot.  Notable bruising through thigh. Tolerates ankle DF/PF.  Endorses sensation light touch over all aspects of the toes.  Compartment soft and compressible.  Toes warm and well-perfused  IMAGING: Stable post op imaging.   LABS:  Results for orders placed or performed during the hospital encounter of 07/21/21 (from the past 24 hour(s))  Glucose, capillary     Status: Abnormal   Collection Time: 07/24/21 11:26 AM  Result Value Ref Range   Glucose-Capillary 214 (H) 70 - 99 mg/dL  Glucose, capillary     Status: Abnormal   Collection Time: 07/24/21  4:49 PM  Result Value Ref Range   Glucose-Capillary 218 (H) 70 - 99 mg/dL  Glucose, capillary     Status: Abnormal   Collection Time: 07/24/21  9:42 PM  Result Value Ref Range   Glucose-Capillary 203 (H) 70 - 99 mg/dL  CBC     Status: Abnormal   Collection Time: 07/25/21  2:06 AM  Result Value Ref Range   WBC 12.4 (H) 4.0 - 10.5 K/uL   RBC 2.24 (L) 3.87 - 5.11 MIL/uL   Hemoglobin 7.0 (L) 12.0 - 15.0 g/dL   HCT 20.6 (L) 36.0 - 46.0 %   MCV 92.0 80.0 - 100.0 fL   MCH 31.3 26.0 - 34.0 pg   MCHC 34.0 30.0 - 36.0 g/dL   RDW 13.4 11.5 - 15.5 %   Platelets 456 (H) 150 - 400 K/uL   nRBC 0.0 0.0 - 0.2 %  Basic metabolic panel     Status: Abnormal   Collection Time: 07/25/21  2:06 AM  Result Value Ref Range    Sodium 129 (L) 135 - 145 mmol/L   Potassium 4.6 3.5 - 5.1 mmol/L   Chloride 94 (L) 98 - 111 mmol/L   CO2 27 22 - 32 mmol/L   Glucose, Bld 182 (H) 70 - 99 mg/dL   BUN 23 8 - 23 mg/dL   Creatinine, Ser 1.07 (H) 0.44 - 1.00 mg/dL   Calcium 8.5 (L) 8.9 - 10.3 mg/dL   GFR, Estimated 50 (L) >60 mL/min   Anion gap 8 5 - 15  Glucose, capillary     Status: Abnormal   Collection Time: 07/25/21  8:40 AM  Result Value Ref Range   Glucose-Capillary 173 (H) 70 - 99 mg/dL  Glucose, capillary     Status: Abnormal   Collection Time: 07/25/21  9:43 AM  Result Value Ref Range   Glucose-Capillary 263 (H) 70 - 99 mg/dL    ASSESSMENT: Lisa Mills is a 88 y.o. female, 2 Days Post-Op s/p ORIF LEFT DISTAL FEMUR FRACTURE INTRAMEDULLARY NAIL LEFT FEMUR HARDWARE REMOVAL LEFT FEMUR  CV/Blood loss: ABLA, Hgb 7.0 this AM. Hemodynamically stable  PLAN: Weightbearing: WBAT RLE ROM: Okay for unrestricted ROM Incisional and dressing care: Removed today, ok to leave open to air Showering: [employer]  to begin showering getting incisions wet 07/26/2021 Orthopedic device(s): None  Pain management:  1. Tylenol 325-650 mg q 6 hours PRN 2. Robaxin 500 mg q 6 hours PRN 3. Norco 5-325 mg q 4 hours PRN OR Norco 7.5-325 mg q 4 hours PRN 4. Dilaudid 1 mg q 3 hours PRN VTE prophylaxis: Lovenox, SCDs ID:  Ancef 2gm post op Foley/Lines:  No foley, KVO IVFs Impediments to Fracture Healing: Vitamin D level  79, no supplementation needed Dispo: Therapies as tolerated, PT/OT recommend SNF.  Continue to monitor CBC.  Okay for discharge from ortho standpoint once cleared by medicine team and therapies. Rx for pain medication and DVT prophylaxis signed and placed in patient's chart.  D/C recommendations: -Norco for pain control -Eliquis for DVT prophylaxis -No need for Vit D supplementation  Follow - up plan: 2 weeks after discharge for wound check and repeat x-rays   Contact information:  Katha Hamming MD, Rushie Nyhan  PA-C. After hours and holidays please check [email or web address] for group call information for [facility]   Gwinda Passe, PA-C [phone] (office) [email or web address]

## 2021-07-26 DIAGNOSIS — Z9889 Other specified postprocedural states: Secondary | ICD-10-CM | POA: Diagnosis not present

## 2021-07-26 DIAGNOSIS — E871 Hypo-osmolality and hyponatremia: Secondary | ICD-10-CM | POA: Diagnosis not present

## 2021-07-26 DIAGNOSIS — E038 Other specified hypothyroidism: Secondary | ICD-10-CM | POA: Diagnosis not present

## 2021-07-26 DIAGNOSIS — S7292XA Unspecified fracture of left femur, initial encounter for closed fracture: Secondary | ICD-10-CM | POA: Diagnosis not present

## 2021-07-26 LAB — BASIC METABOLIC PANEL
Anion gap: 7 (ref 5–15)
BUN: 20 mg/dL (ref 8–23)
CO2: 26 mmol/L (ref 22–32)
Calcium: 8.3 mg/dL — ABNORMAL LOW (ref 8.9–10.3)
Chloride: 94 mmol/L — ABNORMAL LOW (ref 98–111)
Creatinine, Ser: 1.09 mg/dL — ABNORMAL HIGH (ref 0.44–1.00)
GFR, Estimated: 49 mL/min — ABNORMAL LOW (ref >60)
Glucose, Bld: 152 mg/dL — ABNORMAL HIGH (ref 70–99)
Potassium: 4.6 mmol/L (ref 3.5–5.1)
Sodium: 127 mmol/L — ABNORMAL LOW (ref 135–145)

## 2021-07-26 LAB — CBC
HCT: 19 % — ABNORMAL LOW (ref 36.0–46.0)
Hemoglobin: 6.5 g/dL — CL (ref 12.0–15.0)
MCH: 31.3 pg (ref 26.0–34.0)
MCHC: 34.2 g/dL (ref 30.0–36.0)
MCV: 91.3 fL (ref 80.0–100.0)
Platelets: 477 10*3/uL — ABNORMAL HIGH (ref 150–400)
RBC: 2.08 MIL/uL — ABNORMAL LOW (ref 3.87–5.11)
RDW: 13.2 % (ref 11.5–15.5)
WBC: 11.1 10*3/uL — ABNORMAL HIGH (ref 4.0–10.5)
nRBC: 0 % (ref 0.0–0.2)

## 2021-07-26 LAB — GLUCOSE, CAPILLARY
Glucose-Capillary: 168 mg/dL — ABNORMAL HIGH (ref 70–99)
Glucose-Capillary: 174 mg/dL — ABNORMAL HIGH (ref 70–99)
Glucose-Capillary: 177 mg/dL — ABNORMAL HIGH (ref 70–99)
Glucose-Capillary: 266 mg/dL — ABNORMAL HIGH (ref 70–99)
Glucose-Capillary: 114 mg/dL — ABNORMAL HIGH (ref 70–99)

## 2021-07-26 LAB — HEMOGLOBIN AND HEMATOCRIT, BLOOD
HCT: 26.3 % — ABNORMAL LOW (ref 36.0–46.0)
Hemoglobin: 8.7 g/dL — ABNORMAL LOW (ref 12.0–15.0)

## 2021-07-26 LAB — PREPARE RBC (CROSSMATCH)

## 2021-07-26 MED ORDER — SODIUM CHLORIDE 0.9% IV SOLUTION: Freq: Once | INTRAVENOUS | Status: AC

## 2021-07-26 NOTE — TOC Progression Note (Signed)
Transition of Care [facility]) - Progression Note    Patient Details  Name: Lisa Mills MRN: [id number] Date of Birth: [birth year]  Transition of Care [facility]) CM/SW Contact  Joanne Chars, LCSW Phone Number: 07/26/2021, 12:46 PM  Clinical Narrative:   CSW received phone call from HTA with SNF approval: 7 days SNF: [id number], EMS transport: [phone].    Expected Discharge Plan: [place] Barriers to Discharge: Continued Medical Work up  Expected Discharge Plan and Services Expected Discharge Plan: [place] In-house Referral: Clinical Social Work   Post Acute Care Choice: [place] Living arrangements for the past 2 months: Single Family Home                                       Social Determinants of Health (SDOH) Interventions    Readmission Risk Interventions     No data to display

## 2021-07-26 NOTE — Progress Notes (Signed)
Blood started w/o any complaints.  Vitals are stable. Pt is aware of signs and symptoms of a reaction.  Will continue to monitor.

## 2021-07-26 NOTE — Progress Notes (Signed)
[facility]  PROGRESS NOTE  Lisa Mills QIW:[id number] DOB: 11/07/1929 DOA: 07/21/2021 PCP: Olin Hauser, DO   Brief HPI:   88 y.o. female with medical history significant for diet-controlled diabetes mellitus, hypothyroidism, hypertension, recurrent UTIs, CKD 3A, chronic anemia, chronic hyponatremia, and left femoral shaft fracture status post reduction and internal fixation on 07/13/2021, now presenting from her SNF for evaluation of left leg pain.  After the recent hospitalization for surgical repair of the left femoral shaft fracture, patient went to acute rehab facility where she reports progressing quite well until few days ago when she was working with PT, shifted her weight from the right foot to the left, and felt her leg "give out."  Since then, she has had reduced range of motion, pain with movement, and difficulty bearing weight.  She had radiographs performed at the facility and was sent to the ED for further evaluation and management.   In the ED, pt was found to be afebrile and saturating well on room air with stable blood pressure.  Chemistry panel with stable hyponatremia and renal insufficiency.  CBC was stable normocytic anemia and new thrombocytosis.  Radiographs of the left femur demonstrate new fracture of the distal femur fracture fragment with dislocation of the intramedullary rod and loosening of screws.   Subjective   Patient seen, denies any complaints.  No pain.  Awaiting bed at skilled nursing facility   Assessment/Plan:     Periprosthetic left femoral shaft fracture  - S/p ORIF on 6/25 now presenting with pain and deformity after feeling the leg "give out" a few days ago PTA - Plain radiographs demonstrate new fracture involving distal fracture fragment with displaced IM rod and loosened screws  - Orthopedic Surgery consulted -Patient underwent ORIF left distal femur fracture, prophylactic nailing of left intertrochanteric femur, removal of  hardware left femur -- Cont with analgesia as needed -Eliquis started for DVT prophylaxis   2. Type II DM  - A1c was 7.5% in February 2023  - cont SSI  -CBG stable -Continue sensitive sliding scale    3. Hyponatremia   - Chronic, appears stable, attributed to SIADH and recently started on salt tabs   - Cont fluid restricted diet   4. CKD IIIa  - Appears stable  - Renally-dose medications    5. Hypothyroidism  - On Synthroid     6. Hypertension  -Blood pressure is now elevated -Started amlodipine 5 mg daily -Continue lisinopril 5 mg daily   7.  Anemia -Hemoglobin is 7.0 -Likely dilutional, will discontinue IV fluids -Patient does have chronic anemia -Transfuse for hemoglobin less than 7      Medications     sodium chloride   Intravenous Once   amLODipine  5 mg Oral Daily   atorvastatin  20 mg Oral Daily   cholecalciferol  2,000 Units Oral Daily   enoxaparin (LOVENOX) injection  30 mg Subcutaneous Q24H   escitalopram  10 mg Oral Daily   feeding supplement  237 mL Oral BID BM   ferrous sulfate  325 mg Oral Daily   insulin aspart  0-9 Units Subcutaneous TID WC   levothyroxine  25 mcg Oral QAC breakfast   lisinopril  5 mg Oral Daily   multivitamin with minerals  1 tablet Oral Daily   pantoprazole  40 mg Oral Daily   senna  1 tablet Oral QHS     Data Reviewed:   CBG:  Recent Labs  Lab 07/25/21 2009 07/25/21 2343 07/26/21  5208 07/26/21 0744 07/26/21 1115  GLUCAP 240* 176* 168* 174* 177*    SpO2: 98 % O2 Flow Rate (L/min): 2 L/min    Vitals:   07/26/21 0507 07/26/21 0511 07/26/21 0615 07/26/21 0742  BP: (!) 139/52 (!) 129/56 129/66 (!) 173/72  Pulse: 83 81 79 78  Resp:  '18 18 16  '$ Temp: 98.5 F (36.9 C) 98.5 F (36.9 C)  98.3 F (36.8 C)  TempSrc: Oral Oral  Oral  SpO2: 97% 96% 97% 98%  Weight:      Height:          Data Reviewed:  Basic Metabolic Panel: Recent Labs  Lab 07/22/21 0154 07/23/21 0149 07/24/21 0318 07/25/21 0206  07/26/21 0147  NA 130* 127* 129* 129* 127*  K 4.2 4.2 4.4 4.6 4.6  CL 95* 93* 94* 94* 94*  CO2 '26 25 26 27 26  '$ GLUCOSE 135* 131* 184* 182* 152*  BUN 22 25* '23 23 20  '$ CREATININE 0.96 1.10* 1.01* 1.07* 1.09*  CALCIUM 8.9 8.7* 8.5* 8.5* 8.3*    CBC: Recent Labs  Lab 07/22/21 0154 07/23/21 0149 07/24/21 0318 07/25/21 0206 07/26/21 0147 07/26/21 0926  WBC 7.6 7.3 12.0* 12.4* 11.1*  --   HGB 7.5* 7.5* 7.7* 7.0* 6.5* 8.7*  HCT 21.9* 21.5* 22.9* 20.6* 19.0* 26.3*  MCV 92.0 90.3 92.0 92.0 91.3  --   PLT 451* 474* 512* 456* 477*  --     LFT No results for input(s): "AST", "ALT", "ALKPHOS", "BILITOT", "PROT", "ALBUMIN" in the last 168 hours.   Antibiotics: Anti-infectives (From admission, onward)    Start     Dose/Rate Route Frequency Ordered Stop   07/23/21 1700  ceFAZolin (ANCEF) IVPB 2g/100 mL premix        2 g 200 mL/hr over 30 Minutes Intravenous Every 12 hours 07/23/21 1233 07/24/21 0542   07/23/21 1008  vancomycin (VANCOCIN) powder  Status:  Discontinued          As needed 07/23/21 1009 07/23/21 1056        DVT prophylaxis: Lovenox  Code Status: Full code  Family Communication: No family at bedside   CONSULTS orthopedics   Objective    Physical Examination:   General-appears in no acute distress Heart-S1-S2, regular, no murmur auscultated Lungs-clear to auscultation bilaterally, no wheezing or crackles auscultated Abdomen-soft, nontender, no organomegaly Extremities-no edema in the lower extremities Neuro-alert, oriented x3, no focal deficit noted  Status is: Inpatient:             Oswald Hillock   [facility] If 7PM-7AM, please contact night-coverage at [email or web address], Office  [phone]   07/26/2021, 3:17 PM  LOS: 5 days

## 2021-07-26 NOTE — Progress Notes (Signed)
Critical Hgb 6.5 '@0147'$  Notified by Ray '@0225'$ 

## 2021-07-26 NOTE — Progress Notes (Signed)
Mobility Specialist Progress Note:   07/26/21 1044  Mobility  Activity Transferred from bed to chair  Level of Assistance Moderate assist, patient does 50-74%  Assistive Device Front wheel walker  Distance Ambulated (ft) 2 ft  Activity Response Tolerated well  $Mobility charge 1 Mobility   Pt received in bed willing to sit up in chair. Complaints of LLE pain. ModA to stand and step to recliner. Left in chair with call bell in reach and all needs met.   [facility] Maddix Kliewer Mobility Specialist

## 2021-07-26 NOTE — Plan of Care (Signed)
  Problem: Education: Goal: Knowledge of General Education information will improve Description: Including pain rating scale, medication(s)/side effects and non-pharmacologic comfort measures Outcome: Progressing   Problem: Health Behavior/Discharge Planning: Goal: Ability to manage health-related needs will improve Outcome: Progressing   Problem: Activity: Goal: Risk for activity intolerance will decrease Outcome: Progressing   Problem: Nutrition: Goal: Adequate nutrition will be maintained Outcome: Progressing   Problem: Pain Managment: Goal: General experience of comfort will improve Outcome: Progressing   Problem: Safety: Goal: Ability to remain free from injury will improve Outcome: Progressing   Problem: Nutritional: Goal: Maintenance of adequate nutrition will improve Outcome: Progressing

## 2021-07-27 DIAGNOSIS — Z9889 Other specified postprocedural states: Secondary | ICD-10-CM | POA: Diagnosis not present

## 2021-07-27 DIAGNOSIS — E871 Hypo-osmolality and hyponatremia: Secondary | ICD-10-CM | POA: Diagnosis not present

## 2021-07-27 DIAGNOSIS — E038 Other specified hypothyroidism: Secondary | ICD-10-CM | POA: Diagnosis not present

## 2021-07-27 DIAGNOSIS — S7292XA Unspecified fracture of left femur, initial encounter for closed fracture: Secondary | ICD-10-CM | POA: Diagnosis not present

## 2021-07-27 LAB — CBC
HCT: 27.2 % — ABNORMAL LOW (ref 36.0–46.0)
Hemoglobin: 9.4 g/dL — ABNORMAL LOW (ref 12.0–15.0)
MCH: 31.3 pg (ref 26.0–34.0)
MCHC: 34.6 g/dL (ref 30.0–36.0)
MCV: 90.7 fL (ref 80.0–100.0)
Platelets: 616 10*3/uL — ABNORMAL HIGH (ref 150–400)
RBC: 3 MIL/uL — ABNORMAL LOW (ref 3.87–5.11)
RDW: 13.4 % (ref 11.5–15.5)
WBC: 8.9 10*3/uL (ref 4.0–10.5)
nRBC: 0 % (ref 0.0–0.2)

## 2021-07-27 LAB — TYPE AND SCREEN
ABO/RH(D): AB POS
Antibody Screen: NEGATIVE
Unit division: 0

## 2021-07-27 LAB — BPAM RBC
Blood Product Expiration Date: 202307262359
ISSUE DATE / TIME: 202307080432
Unit Type and Rh: 6200

## 2021-07-27 LAB — GLUCOSE, CAPILLARY
Glucose-Capillary: 166 mg/dL — ABNORMAL HIGH (ref 70–99)
Glucose-Capillary: 204 mg/dL — ABNORMAL HIGH (ref 70–99)
Glucose-Capillary: 268 mg/dL — ABNORMAL HIGH (ref 70–99)
Glucose-Capillary: 160 mg/dL — ABNORMAL HIGH (ref 70–99)

## 2021-07-27 NOTE — Progress Notes (Signed)
[facility]  PROGRESS NOTE  SHERIE DOBROWOLSKI TKP:[id number] DOB: [birth year] DOA: 07/21/2021 PCP: Olin Hauser, DO   Brief HPI:   88 y.o. female with medical history significant for diet-controlled diabetes mellitus, hypothyroidism, hypertension, recurrent UTIs, CKD 3A, chronic anemia, chronic hyponatremia, and left femoral shaft fracture status post reduction and internal fixation on 07/13/2021, now presenting from her SNF for evaluation of left leg pain.  After the recent hospitalization for surgical repair of the left femoral shaft fracture, patient went to acute rehab facility where she reports progressing quite well until few days ago when she was working with PT, shifted her weight from the right foot to the left, and felt her leg "give out."  Since then, she has had reduced range of motion, pain with movement, and difficulty bearing weight.  She had radiographs performed at the facility and was sent to the ED for further evaluation and management.   In the ED, pt was found to be afebrile and saturating well on room air with stable blood pressure.  Chemistry panel with stable hyponatremia and renal insufficiency.  CBC was stable normocytic anemia and new thrombocytosis.  Radiographs of the left femur demonstrate new fracture of the distal femur fracture fragment with dislocation of the intramedullary rod and loosening of screws.   Subjective   Patient seen and examined, no complaints.  Awaiting bed at skilled nursing facility   Assessment/Plan:     Periprosthetic left femoral shaft fracture  - S/p ORIF on 6/25 now presenting with pain and deformity after feeling the leg "give out" a few days ago PTA - Plain radiographs demonstrate new fracture involving distal fracture fragment with displaced IM rod and loosened screws  - Orthopedic Surgery consulted -Patient underwent ORIF left distal femur fracture, prophylactic nailing of left intertrochanteric femur, removal of  hardware left femur -- Cont with analgesia as needed -Eliquis started for DVT prophylaxis   2. Type II DM  - A1c was 7.5% in February 2023  - cont SSI  -CBG stable -Continue sensitive sliding scale    3. Hyponatremia   - Chronic, appears stable, attributed to SIADH and recently started on salt tabs   - Cont fluid restricted diet   4. CKD IIIa  - Appears stable  - Renally-dose medications    5. Hypothyroidism  - On Synthroid     6. Hypertension  -Blood pressure is better controlled -Started amlodipine 5 mg daily -Continue lisinopril 5 mg daily   7.  Anemia -Hemoglobin is 8.7 -Recheck hemoglobin today      Medications     sodium chloride   Intravenous Once   amLODipine  5 mg Oral Daily   atorvastatin  20 mg Oral Daily   cholecalciferol  2,000 Units Oral Daily   enoxaparin (LOVENOX) injection  30 mg Subcutaneous Q24H   escitalopram  10 mg Oral Daily   feeding supplement  237 mL Oral BID BM   ferrous sulfate  325 mg Oral Daily   insulin aspart  0-9 Units Subcutaneous TID WC   levothyroxine  25 mcg Oral QAC breakfast   lisinopril  5 mg Oral Daily   multivitamin with minerals  1 tablet Oral Daily   pantoprazole  40 mg Oral Daily   senna  1 tablet Oral QHS     Data Reviewed:   CBG:  Recent Labs  Lab 07/26/21 1115 07/26/21 1616 07/26/21 2036 07/27/21 0813 07/27/21 1143  GLUCAP 177* 266* 114* 166* 204*    SpO2:  97 % O2 Flow Rate (L/min): 2 L/min    Vitals:   07/26/21 1617 07/26/21 2040 07/27/21 0502 07/27/21 0813  BP: 122/62 (!) 130/58 (!) 148/63 (!) 155/62  Pulse: 85 82 83 80  Resp: '17 16 16 16  '$ Temp: 98.4 F (36.9 C) 98.5 F (36.9 C) 98.2 F (36.8 C) 98.1 F (36.7 C)  TempSrc: Oral Oral Oral   SpO2: 99% 98% 96% 97%  Weight:      Height:          Data Reviewed:  Basic Metabolic Panel: Recent Labs  Lab 07/22/21 0154 07/23/21 0149 07/24/21 0318 07/25/21 0206 07/26/21 0147  NA 130* 127* 129* 129* 127*  K 4.2 4.2 4.4 4.6 4.6  CL  95* 93* 94* 94* 94*  CO2 '26 25 26 27 26  '$ GLUCOSE 135* 131* 184* 182* 152*  BUN 22 25* '23 23 20  '$ CREATININE 0.96 1.10* 1.01* 1.07* 1.09*  CALCIUM 8.9 8.7* 8.5* 8.5* 8.3*    CBC: Recent Labs  Lab 07/22/21 0154 07/23/21 0149 07/24/21 0318 07/25/21 0206 07/26/21 0147 07/26/21 0926  WBC 7.6 7.3 12.0* 12.4* 11.1*  --   HGB 7.5* 7.5* 7.7* 7.0* 6.5* 8.7*  HCT 21.9* 21.5* 22.9* 20.6* 19.0* 26.3*  MCV 92.0 90.3 92.0 92.0 91.3  --   PLT 451* 474* 512* 456* 477*  --     LFT No results for input(s): "AST", "ALT", "ALKPHOS", "BILITOT", "PROT", "ALBUMIN" in the last 168 hours.   Antibiotics: Anti-infectives (From admission, onward)    Start     Dose/Rate Route Frequency Ordered Stop   07/23/21 1700  ceFAZolin (ANCEF) IVPB 2g/100 mL premix        2 g 200 mL/hr over 30 Minutes Intravenous Every 12 hours 07/23/21 1233 07/24/21 0542   07/23/21 1008  vancomycin (VANCOCIN) powder  Status:  Discontinued          As needed 07/23/21 1009 07/23/21 1056        DVT prophylaxis: Lovenox  Code Status: Full code  Family Communication: No family at bedside   CONSULTS orthopedics   Objective    Physical Examination:   General-appears in no acute distress Heart-S1-S2, regular, no murmur auscultated Lungs-clear to auscultation bilaterally, no wheezing or crackles auscultated Abdomen-soft, nontender, no organomegaly Extremities-no edema in the lower extremities Neuro-alert, oriented x3, no focal deficit noted  Status is: Inpatient:             Oswald Hillock   [facility] If 7PM-7AM, please contact night-coverage at [email or web address], Office  [phone]   07/27/2021, 12:01 PM  LOS: 6 days

## 2021-07-28 ENCOUNTER — Telehealth: Payer: Self-pay

## 2021-07-28 DIAGNOSIS — K5900 Constipation, unspecified: Secondary | ICD-10-CM | POA: Diagnosis not present

## 2021-07-28 DIAGNOSIS — E7800 Pure hypercholesterolemia, unspecified: Secondary | ICD-10-CM | POA: Diagnosis not present

## 2021-07-28 DIAGNOSIS — S7292XD Unspecified fracture of left femur, subsequent encounter for closed fracture with routine healing: Secondary | ICD-10-CM | POA: Diagnosis not present

## 2021-07-28 DIAGNOSIS — Z8781 Personal history of (healed) traumatic fracture: Secondary | ICD-10-CM | POA: Diagnosis not present

## 2021-07-28 DIAGNOSIS — R531 Weakness: Secondary | ICD-10-CM | POA: Diagnosis not present

## 2021-07-28 DIAGNOSIS — E1122 Type 2 diabetes mellitus with diabetic chronic kidney disease: Secondary | ICD-10-CM | POA: Diagnosis not present

## 2021-07-28 DIAGNOSIS — Z7401 Bed confinement status: Secondary | ICD-10-CM | POA: Diagnosis not present

## 2021-07-28 DIAGNOSIS — E119 Type 2 diabetes mellitus without complications: Secondary | ICD-10-CM | POA: Diagnosis not present

## 2021-07-28 DIAGNOSIS — K219 Gastro-esophageal reflux disease without esophagitis: Secondary | ICD-10-CM | POA: Diagnosis not present

## 2021-07-28 DIAGNOSIS — Z9889 Other specified postprocedural states: Secondary | ICD-10-CM | POA: Diagnosis not present

## 2021-07-28 DIAGNOSIS — E038 Other specified hypothyroidism: Secondary | ICD-10-CM | POA: Diagnosis not present

## 2021-07-28 DIAGNOSIS — I129 Hypertensive chronic kidney disease with stage 1 through stage 4 chronic kidney disease, or unspecified chronic kidney disease: Secondary | ICD-10-CM | POA: Diagnosis not present

## 2021-07-28 DIAGNOSIS — S72402D Unspecified fracture of lower end of left femur, subsequent encounter for closed fracture with routine healing: Secondary | ICD-10-CM | POA: Diagnosis not present

## 2021-07-28 DIAGNOSIS — M8580 Other specified disorders of bone density and structure, unspecified site: Secondary | ICD-10-CM | POA: Diagnosis not present

## 2021-07-28 DIAGNOSIS — N1831 Chronic kidney disease, stage 3a: Secondary | ICD-10-CM | POA: Diagnosis not present

## 2021-07-28 DIAGNOSIS — E785 Hyperlipidemia, unspecified: Secondary | ICD-10-CM | POA: Diagnosis not present

## 2021-07-28 DIAGNOSIS — N1832 Chronic kidney disease, stage 3b: Secondary | ICD-10-CM | POA: Diagnosis not present

## 2021-07-28 DIAGNOSIS — S79929A Unspecified injury of unspecified thigh, initial encounter: Secondary | ICD-10-CM | POA: Diagnosis not present

## 2021-07-28 DIAGNOSIS — W19XXXA Unspecified fall, initial encounter: Secondary | ICD-10-CM | POA: Diagnosis not present

## 2021-07-28 DIAGNOSIS — S7292XA Unspecified fracture of left femur, initial encounter for closed fracture: Secondary | ICD-10-CM | POA: Diagnosis not present

## 2021-07-28 DIAGNOSIS — F32A Depression, unspecified: Secondary | ICD-10-CM | POA: Diagnosis not present

## 2021-07-28 DIAGNOSIS — E559 Vitamin D deficiency, unspecified: Secondary | ICD-10-CM | POA: Diagnosis not present

## 2021-07-28 DIAGNOSIS — K5904 Chronic idiopathic constipation: Secondary | ICD-10-CM | POA: Diagnosis not present

## 2021-07-28 DIAGNOSIS — G2581 Restless legs syndrome: Secondary | ICD-10-CM | POA: Diagnosis not present

## 2021-07-28 DIAGNOSIS — E871 Hypo-osmolality and hyponatremia: Secondary | ICD-10-CM | POA: Diagnosis not present

## 2021-07-28 DIAGNOSIS — E440 Moderate protein-calorie malnutrition: Secondary | ICD-10-CM | POA: Diagnosis not present

## 2021-07-28 DIAGNOSIS — I10 Essential (primary) hypertension: Secondary | ICD-10-CM | POA: Diagnosis not present

## 2021-07-28 DIAGNOSIS — E039 Hypothyroidism, unspecified: Secondary | ICD-10-CM | POA: Diagnosis not present

## 2021-07-28 DIAGNOSIS — E063 Autoimmune thyroiditis: Secondary | ICD-10-CM | POA: Diagnosis not present

## 2021-07-28 DIAGNOSIS — Z7901 Long term (current) use of anticoagulants: Secondary | ICD-10-CM | POA: Diagnosis not present

## 2021-07-28 DIAGNOSIS — E782 Mixed hyperlipidemia: Secondary | ICD-10-CM | POA: Diagnosis not present

## 2021-07-28 DIAGNOSIS — N390 Urinary tract infection, site not specified: Secondary | ICD-10-CM | POA: Diagnosis not present

## 2021-07-28 DIAGNOSIS — M6281 Muscle weakness (generalized): Secondary | ICD-10-CM | POA: Diagnosis not present

## 2021-07-28 DIAGNOSIS — N183 Chronic kidney disease, stage 3 unspecified: Secondary | ICD-10-CM | POA: Diagnosis not present

## 2021-07-28 DIAGNOSIS — M9702XD Periprosthetic fracture around internal prosthetic left hip joint, subsequent encounter: Secondary | ICD-10-CM | POA: Diagnosis not present

## 2021-07-28 DIAGNOSIS — D62 Acute posthemorrhagic anemia: Secondary | ICD-10-CM | POA: Diagnosis not present

## 2021-07-28 LAB — GLUCOSE, CAPILLARY
Glucose-Capillary: 155 mg/dL — ABNORMAL HIGH (ref 70–99)
Glucose-Capillary: 175 mg/dL — ABNORMAL HIGH (ref 70–99)

## 2021-07-28 MED ORDER — AMLODIPINE BESYLATE 5 MG PO TABS: 5.0000 mg | ORAL_TABLET | Freq: Every day | ORAL | Status: DC

## 2021-07-28 MED ORDER — ENSURE ENLIVE PO LIQD: 237.0000 mL | Freq: Two times a day (BID) | ORAL | 12 refills | Status: AC

## 2021-07-28 MED ORDER — TIZANIDINE HCL 4 MG PO TABS: 2.0000 mg | ORAL_TABLET | Freq: Once | ORAL | Status: DC

## 2021-07-28 MED ORDER — METFORMIN HCL 500 MG PO TABS: 500.0000 mg | ORAL_TABLET | Freq: Two times a day (BID) | ORAL | 11 refills | Status: DC

## 2021-07-28 NOTE — TOC Transition Note (Signed)
Transition of Care [facility]) - CM/SW Discharge Note   Patient Details  Name: Lisa Mills MRN: [id number] Date of Birth: 1929/10/13  [facility] of [facility]) CM/SW Contact:  Joanne Chars, LCSW Phone Number: 07/28/2021, 11:09 AM   Clinical Narrative:   Pt discharging to [facility].  RN call report to [phone].      Final next level of care: Skilled Nursing Facility Barriers to Discharge: Barriers Resolved   Patient Goals and CMS Choice Patient states their goals for this hospitalization and ongoing recovery are:: walk and go home   Choice offered to / list presented to : Patient, Adult Children (pt and son want pt to return to [facility])  Discharge Placement              Patient chooses bed at:  [facility]) Patient to be transferred to facility by: [place] Name of family member notified: son [name] Patient and family notified of of transfer: 07/28/21  Discharge Plan and Services In-house Referral: Clinical Social Work   Post Acute Care Choice: [place]                               Social Determinants of Health (SDOH) Interventions     Readmission Risk Interventions     No data to display

## 2021-07-28 NOTE — Progress Notes (Signed)
Orthopaedic Trauma Progress Note  SUBJECTIVE: Doing okay this morning.  Has been frustrated with her recovery as she feels it is not gone as well as after her for surgery.  Has had difficulty standing on both legs.  Also notes some intermittent muscle spasms and jerking in her left leg.  I have added a one-time dose of Zanaflex this morning.  Otherwise, pain has been controlled.  We discussed that given patient having 2 injuries and 2 subsequent surgeries very close together, this recovery may take slightly longer than it did following last procedure.  I have encouraged her to stay motivated and not get discouraged with her progress.  Scheduled for discharge to SNF later today.  OBJECTIVE:  Vitals:   07/28/21 0450 07/28/21 0739  BP: (!) 171/73 (!) 151/63  Pulse: 84 80  Resp: 19   Temp: 98.2 F (36.8 C) 98.4 F (36.9 C)  SpO2: 97% 100%    General: Sitting up on edge of bed eating breakfast.  No acute distress Respiratory: No increased work of breathing.  Left lower extremity: Incisions CDI. Tenderness throughout the thigh as expected.  Less tender throughout the lower leg, ankle, foot.  Notable bruising through thigh. Tolerates ankle DF/PF.  Endorses sensation light touch over all aspects of the toes.  Compartment soft and compressible.  Toes warm and well-perfused  IMAGING: Stable post op imaging.   LABS:  Results for orders placed or performed during the hospital encounter of 07/21/21 (from the past 24 hour(s))  Glucose, capillary     Status: Abnormal   Collection Time: 07/27/21 11:43 AM  Result Value Ref Range   Glucose-Capillary 204 (H) 70 - 99 mg/dL  CBC     Status: Abnormal   Collection Time: 07/27/21 12:23 PM  Result Value Ref Range   WBC 8.9 4.0 - 10.5 K/uL   RBC 3.00 (L) 3.87 - 5.11 MIL/uL   Hemoglobin 9.4 (L) 12.0 - 15.0 g/dL   HCT 27.2 (L) 36.0 - 46.0 %   MCV 90.7 80.0 - 100.0 fL   MCH 31.3 26.0 - 34.0 pg   MCHC 34.6 30.0 - 36.0 g/dL   RDW 13.4 11.5 - 15.5 %    Platelets 616 (H) 150 - 400 K/uL   nRBC 0.0 0.0 - 0.2 %  Glucose, capillary     Status: Abnormal   Collection Time: 07/27/21  3:46 PM  Result Value Ref Range   Glucose-Capillary 268 (H) 70 - 99 mg/dL  Glucose, capillary     Status: Abnormal   Collection Time: 07/27/21  8:06 PM  Result Value Ref Range   Glucose-Capillary 160 (H) 70 - 99 mg/dL  Glucose, capillary     Status: Abnormal   Collection Time: 07/28/21  7:35 AM  Result Value Ref Range   Glucose-Capillary 155 (H) 70 - 99 mg/dL   Comment 1 Document in Chart     ASSESSMENT: Lisa Mills is a 88 y.o. female, 5 Days Post-Op s/p ORIF LEFT DISTAL FEMUR FRACTURE INTRAMEDULLARY NAIL LEFT FEMUR HARDWARE REMOVAL LEFT FEMUR  CV/Blood loss: Hemoglobin 9.4 on 07/27/2021.    PLAN: Weightbearing: WBAT RLE ROM: Okay for unrestricted ROM Incisional and dressing care: Ok to leave open to air Showering: Okay to begin showering and getting incisions wet Orthopedic device(s): None  Pain management:  1. Tylenol 325-650 mg q 6 hours PRN 2. Zanaflex 2 mg x 1 dose  3. Norco 5-325 mg q 4 hours PRN OR Norco 7.5-325 mg q 4 hours PRN 4.  Fenatnyl 12.5-25 mcg q 2 hours PRN VTE prophylaxis: Lovenox, SCDs ID:  Ancef 2gm post op completed Foley/Lines:  No foley, KVO IVFs Impediments to Fracture Healing: Vitamin D level  79, no supplementation needed Dispo: Therapies as tolerated, PT/OT recommend SNF. Okay for discharge from ortho standpoint once cleared by medicine team and therapies. Rx for pain medication and DVT prophylaxis signed and placed in patient's chart.  D/C recommendations: -Norco for pain control -Eliquis for DVT prophylaxis -No need for Vit D supplementation  Follow - up plan: 2 weeks after discharge for wound check and repeat x-rays   Contact information:  Katha Hamming MD, Rushie Nyhan PA-C. After hours and holidays please check [email or web address] for group call information for [facility]   Gwinda Passe, PA-C [phone] (office) [email or web address]

## 2021-07-28 NOTE — Discharge Summary (Signed)
Physician Discharge Summary   Patient: Lisa Mills MRN: [id number] DOB: August 03, 1929  Admit date:     07/21/2021  Discharge date: 07/28/21  Discharge Physician: Oswald Hillock   PCP: Olin Hauser, DO   Recommendations at discharge:  {Tip this will not be part of the note when signed- Example include specific recommendations for outpatient follow-up, pending tests to follow-up on.  Follow-up orthopedics in 2 weeks  Discharge Diagnoses: Principal Problem:   Closed fracture of left femur, unspecified fracture morphology, initial encounter ([place]) Active Problems:   Type 2 diabetes with stage 3 chronic kidney disease GFR 30-59 (HCC)   Hypothyroidism   Stage 3a chronic kidney disease (CKD) (HCC)   Chronic hyponatremia  Resolved Problems:   * No resolved hospital problems. *  Brief HPI  88 y.o. female with medical history significant for diet-controlled diabetes mellitus, hypothyroidism, hypertension, recurrent UTIs, CKD 3A, chronic anemia, chronic hyponatremia, and left femoral shaft fracture status post reduction and internal fixation on 07/13/2021, now presenting from her SNF for evaluation of left leg pain.  After the recent hospitalization for surgical repair of the left femoral shaft fracture, patient went to acute rehab facility where she reports progressing quite well until few days ago when she was working with PT, shifted her weight from the right foot to the left, and felt her leg "give out."  Since then, she has had reduced range of motion, pain with movement, and difficulty bearing weight.  She had radiographs performed at the facility and was sent to the ED for further evaluation and management.   In the ED, pt was found to be afebrile and saturating well on room air with stable blood pressure.  Chemistry panel with stable hyponatremia and renal insufficiency.  CBC was stable normocytic anemia and new thrombocytosis.  Radiographs of the left femur demonstrate new fracture  of the distal femur fracture fragment with dislocation of the intramedullary rod and loosening of screws.  Hospital Course:  Periprosthetic left femoral shaft fracture  - S/p ORIF on 6/25 now presenting with pain and deformity after feeling the leg "give out" a few days ago PTA - Plain radiographs demonstrate new fracture involving distal fracture fragment with displaced IM rod and loosened screws  - Orthopedic Surgery consulted -Patient underwent ORIF left distal femur fracture, prophylactic nailing of left intertrochanteric femur, removal of hardware left femur -- Cont with analgesia as needed -Eliquis started for DVT prophylaxis   2. Type II DM  - A1c was 7.5% in February 2023  -We will start her on metformin 500 mg p.o. twice daily   3. Hyponatremia   - Chronic, appears stable, attributed to SIADH and recently started on salt tabs   - Cont fluid restricted diet   4. CKD IIIa  - Appears stable     5. Hypothyroidism  - On Synthroid     6. Hypertension  -Blood pressure is better controlled -Started amlodipine 5 mg daily -Continue lisinopril 5 mg daily     7.  Anemia -Hemoglobin is 9.4            Consultants: Orthopedics Procedures performed: ORIF left distal femur fracture Disposition: Skilled nursing facility Diet recommendation:  Discharge Diet Orders (From admission, onward)     Start     Ordered   07/28/21 0000  Diet - low sodium heart healthy        07/28/21 1022           Carb modified diet DISCHARGE MEDICATION:  Allergies as of 07/28/2021       Reactions   Aspirin Other (See Comments)   Burns stomach   Conray [iothalamate] Hives   IV dye conray-400   Dye Fdc Red [red Dye] Hives   Sulfa Antibiotics Other (See Comments)   Unknown reaction   Sulfasalazine Other (See Comments)   Unknown reaction   Prednisone Other (See Comments)   Indigestion         Medication List     STOP taking these medications    enoxaparin 40 MG/0.4ML  injection Commonly known as: LOVENOX       TAKE these medications    amLODipine 5 MG tablet Commonly known as: NORVASC Take 1 tablet (5 mg total) by mouth daily. Start taking on: July 29, 2021   apixaban 2.5 MG Tabs tablet Commonly known as: Eliquis Take 1 tablet (2.5 mg total) by mouth 2 (two) times daily.   [place] Dream Arthritis 0.025 % Crea Generic drug: Histamine Dihydrochloride Apply 1 application topically daily as needed (Pain).   Cephalexin 250 MG tablet Take 1 tablet (250 mg total) by mouth daily in the afternoon.   escitalopram 10 MG tablet Commonly known as: LEXAPRO Take 1 tablet (10 mg total) by mouth daily. with food   feeding supplement Liqd Take 237 mLs by mouth 2 (two) times daily between meals.   ferrous sulfate 325 (65 FE) MG tablet Take 325 mg by mouth daily.   glucose blood test strip Commonly known as: ONE TOUCH ULTRA TEST CHECK BLOOD SUGAR UP TO 2 TIMES A DAY.   glycerin adult 2 g suppository Place 1 suppository rectally as needed for constipation.   HYDROcodone-acetaminophen 5-325 MG tablet Commonly known as: NORCO/VICODIN Take 1 tablet by mouth every 4 (four) hours as needed for severe pain. What changed: reasons to take this   hydrocortisone 2.5 % rectal cream Commonly known as: ANUSOL-HC USE 1 APPLICATION RECTALLY TWICE DAILY AS DIRECTED   hydrocortisone 25 MG suppository Commonly known as: ANUSOL-HC Place 25 mg rectally 2 (two) times daily.   levothyroxine 25 MCG tablet Commonly known as: SYNTHROID Take 1 tablet (25 mcg total) by mouth daily before breakfast.   lisinopril 5 MG tablet Commonly known as: ZESTRIL TAKE 1 TABLET BY MOUTH ONCE DAILY   metFORMIN 500 MG tablet Commonly known as: Glucophage Take 1 tablet (500 mg total) by mouth 2 (two) times daily with a meal.   omeprazole 20 MG capsule Commonly known as: PRILOSEC TAKE 1 CAPSULE BY MOUTH ONCE DAILY What changed: when to take this   ONE TOUCH ULTRA 2  w/Device Kit Use to check blood sugar as advised up to 2 times daily   OneTouch Delica Lancets 23J Misc Use to check blood sugar up to 2 times daily   polyethylene glycol 17 g packet Commonly known as: MIRALAX / GLYCOLAX Take 17 g by mouth daily as needed for moderate constipation or severe constipation. What changed: reasons to take this   senna 8.6 MG Tabs tablet Commonly known as: SENOKOT Take 1 tablet (8.6 mg total) by mouth daily.   simvastatin 40 MG tablet Commonly known as: ZOCOR TAKE 1 TABLET BY MOUTH AT BEDTIME   sodium chloride 1 g tablet Take 1 tablet (1 g total) by mouth 2 (two) times daily with a meal.   Vitamin D 50 MCG (2000 UT) Caps Take 2,000 Units by mouth daily.   witch hazel-glycerin pad Commonly known as: TUCKS 1 application  See admin instructions. Apply topically to  rectum daily as needed for itching        Follow-up Information     Haddix, Thomasene Lot, MD. Schedule an appointment as soon as possible for a visit in 2 week(s).   Specialty: Orthopedic Surgery Why: for wound check and repeat x-rays Contact information: [address] [address] [phone]                Discharge Exam: Danley Danker Weights   07/25/21 0500 07/25/21 1414  Weight: 58.6 kg 58.6 kg   General-appears in no acute distress Heart-S1-S2, regular, no murmur auscultated Lungs-clear to auscultation bilaterally, no wheezing or crackles auscultated Abdomen-soft, nontender, no organomegaly Extremities-no edema in the lower extremities Neuro-alert, oriented x3, no focal deficit noted  Condition at discharge: good  The results of significant diagnostics from this hospitalization (including imaging, microbiology, ancillary and laboratory) are listed below for reference.   Imaging Studies: DG FEMUR PORT MIN 2 VIEWS LEFT  Result Date: 07/23/2021 CLINICAL DATA:  Status post left femur surgery EXAM: LEFT FEMUR PORTABLE 2 VIEWS COMPARISON:  Radiographs dated July 21, 2021 FINDINGS: Postsurgical changes of intramedullary nail of the proximal femur and plate and screw fixation of the mid and distal femur. Improved fracture alignment when compared with July 21, 2021 radiograph. Expected postsurgical changes of the overlying soft tissues. IMPRESSION: Postsurgical changes of internal fixation of left femur fracture. Electronically Signed   By: Yetta Glassman M.D.   On: 07/23/2021 12:30   DG FEMUR MIN 2 VIEWS LEFT  Result Date: 07/23/2021 CLINICAL DATA:  ORIF left femur fracture EXAM: LEFT FEMUR 2 VIEWS COMPARISON:  Left femur radiographs 10/22/2018 FINDINGS: Intraoperative images during IM nail removal with new cephalo medullary nail in the proximal femur and lateral plate fixation of the mid to distal femur. Improved fracture alignment. The new hardware appears intact without evidence of immediate complication. IMPRESSION: Intraoperative images during left femur ORIF. New hardware is intact without evidence of immediate complication. Improved femur fracture alignment. Electronically Signed   By: Maurine Simmering M.D.   On: 07/23/2021 11:02   DG C-Arm 1-60 Min-No Report  Result Date: 07/23/2021 Fluoroscopy was utilized by the requesting physician.  No radiographic interpretation.   DG C-Arm 1-60 Min-No Report  Result Date: 07/23/2021 Fluoroscopy was utilized by the requesting physician.  No radiographic interpretation.   DG Femur Min 2 Views Left  Result Date: 07/21/2021 CLINICAL DATA:  Fall, recent ORIF from broken femur. EXAM: PELVIS - 1-2 VIEW; LEFT FEMUR 2 VIEWS; LEFT KNEE - COMPLETE 4+ VIEW COMPARISON:  07/13/2021, 07/09/2019. FINDINGS: There is redemonstration of the oblique fracture of the mid femoral diaphysis with overlapping and medial displacement of the distal fracture fragment. There is a new superimposed fracture involving the distal femoral fragment with displacement of the intramedullary rod and loosening of the screws. The intramedullary rod now terminates  in the region of the patellofemoral compartment. No dislocation at the hip or knee. Moderate degenerative changes are noted at the hips bilaterally and in the lower lumbar spine. No joint effusion at the knee. Vascular calcifications are noted in the soft tissues. There are old fractures of the superior and inferior pubic ramus on the left. Bilateral sacral plasty changes are noted in the pelvis and unchanged from prior exams. IMPRESSION: Status post ORIF with displaced oblique fracture of the mid femoral shaft with overlapping and medial displacement of the distal fracture fragment. There is a new superimposed fracture of the distal femoral fracture fragment with dislocation of the  intramedullary rod and loosening of the screws. Orthopedic surgical consultation is recommended. Electronically Signed   By: Brett Fairy M.D.   On: 07/21/2021 20:38   DG Knee Complete 4 Views Left  Result Date: 07/21/2021 CLINICAL DATA:  Fall, recent ORIF from broken femur. EXAM: PELVIS - 1-2 VIEW; LEFT FEMUR 2 VIEWS; LEFT KNEE - COMPLETE 4+ VIEW COMPARISON:  07/13/2021, 07/09/2019. FINDINGS: There is redemonstration of the oblique fracture of the mid femoral diaphysis with overlapping and medial displacement of the distal fracture fragment. There is a new superimposed fracture involving the distal femoral fragment with displacement of the intramedullary rod and loosening of the screws. The intramedullary rod now terminates in the region of the patellofemoral compartment. No dislocation at the hip or knee. Moderate degenerative changes are noted at the hips bilaterally and in the lower lumbar spine. No joint effusion at the knee. Vascular calcifications are noted in the soft tissues. There are old fractures of the superior and inferior pubic ramus on the left. Bilateral sacral plasty changes are noted in the pelvis and unchanged from prior exams. IMPRESSION: Status post ORIF with displaced oblique fracture of the mid femoral shaft  with overlapping and medial displacement of the distal fracture fragment. There is a new superimposed fracture of the distal femoral fracture fragment with dislocation of the intramedullary rod and loosening of the screws. Orthopedic surgical consultation is recommended. Electronically Signed   By: Brett Fairy M.D.   On: 07/21/2021 20:38   DG Pelvis 1-2 Views  Result Date: 07/21/2021 CLINICAL DATA:  Fall, recent ORIF from broken femur. EXAM: PELVIS - 1-2 VIEW; LEFT FEMUR 2 VIEWS; LEFT KNEE - COMPLETE 4+ VIEW COMPARISON:  07/13/2021, 07/09/2019. FINDINGS: There is redemonstration of the oblique fracture of the mid femoral diaphysis with overlapping and medial displacement of the distal fracture fragment. There is a new superimposed fracture involving the distal femoral fragment with displacement of the intramedullary rod and loosening of the screws. The intramedullary rod now terminates in the region of the patellofemoral compartment. No dislocation at the hip or knee. Moderate degenerative changes are noted at the hips bilaterally and in the lower lumbar spine. No joint effusion at the knee. Vascular calcifications are noted in the soft tissues. There are old fractures of the superior and inferior pubic ramus on the left. Bilateral sacral plasty changes are noted in the pelvis and unchanged from prior exams. IMPRESSION: Status post ORIF with displaced oblique fracture of the mid femoral shaft with overlapping and medial displacement of the distal fracture fragment. There is a new superimposed fracture of the distal femoral fracture fragment with dislocation of the intramedullary rod and loosening of the screws. Orthopedic surgical consultation is recommended. Electronically Signed   By: Brett Fairy M.D.   On: 07/21/2021 20:38   DG FEMUR MIN 2 VIEWS LEFT  Result Date: 07/13/2021 CLINICAL DATA:  Status post ORIF left femur EXAM: LEFT FEMUR 2 VIEWS COMPARISON:  07/12/2021 FINDINGS: Seven images obtained  portably in the operating room via C-arm radiography show open reduction and internal fixation of the distal left femur fracture. IM nail and hip screw reduces the distal femur fracture. IMPRESSION: Status post ORIF of distal left femur fracture. Electronically Signed   By: Kerby Moors M.D.   On: 07/13/2021 15:21   DG C-Arm 1-60 Min-No Report  Result Date: 07/13/2021 Fluoroscopy was utilized by the requesting physician.  No radiographic interpretation.   DG Chest 1 View  Result Date: 07/12/2021 CLINICAL DATA:  Fall, left  femur fracture EXAM: CHEST  1 VIEW COMPARISON:  02/13/2020 FINDINGS: Large hiatal hernia. Heart is normal size. No confluent airspace opacities or effusions. No acute bony abnormality. IMPRESSION: No active cardiopulmonary disease. Large hiatal hernia. Electronically Signed   By: Rolm Baptise M.D.   On: 07/12/2021 22:33   DG Hip Unilat W or Wo Pelvis 2-3 Views Left  Result Date: 07/12/2021 CLINICAL DATA:  Fall EXAM: DG HIP (WITH OR WITHOUT PELVIS) 2-3V LEFT COMPARISON:  Femur series today FINDINGS: Oblique distal femoral shaft fracture again noted as seen on femur series. No additional femoral fracture. Specifically, no femoral neck fracture. No subluxation or dislocation. Degenerative changes in the hips bilaterally with joint space narrowing and spurring. Chronic appearing fractures in the left superior and inferior pubic rami. IMPRESSION: Oblique distal femoral shaft fracture. No acute bony abnormality in the femoral neck region. Old left superior and inferior pubic rami fractures. Electronically Signed   By: Rolm Baptise M.D.   On: 07/12/2021 22:32   DG Femur Portable Min 2 Views Left  Result Date: 07/12/2021 CLINICAL DATA:  Fall EXAM: LEFT FEMUR PORTABLE 2 VIEWS COMPARISON:  None Available. FINDINGS: There is an oblique fracture through the distal shaft of the left femur. Distal fragment is displaced 1 shaft width medially with overlap. No extension into the knee joint. No  joint effusion. IMPRESSION: Oblique displaced distal left femoral shaft fracture. Electronically Signed   By: Rolm Baptise M.D.   On: 07/12/2021 22:31    Microbiology: Results for orders placed or performed during the hospital encounter of 07/12/21  Surgical PCR screen     Status: None   Collection Time: 07/13/21  1:20 AM   Specimen: Nasal Mucosa; Nasal Swab  Result Value Ref Range Status   MRSA, PCR NEGATIVE NEGATIVE Final   Staphylococcus aureus NEGATIVE NEGATIVE Final    Comment: (NOTE) The Xpert SA Assay (FDA approved for NASAL specimens in patients 4 years of age and older), is one component of a comprehensive surveillance program. It is not intended to diagnose infection nor to guide or monitor treatment. Performed at [facility], [address] [place], [place] [address]     Labs: CBC: Recent Labs  Lab 07/23/21 0149 07/24/21 1561 07/25/21 0206 07/26/21 0147 07/26/21 0926 07/27/21 1223  WBC 7.3 12.0* 12.4* 11.1*  --  8.9  HGB 7.5* 7.7* 7.0* 6.5* 8.7* 9.4*  HCT 21.5* 22.9* 20.6* 19.0* 26.3* 27.2*  MCV 90.3 92.0 92.0 91.3  --  90.7  PLT 474* 512* 456* 477*  --  537*   Basic Metabolic Panel: Recent Labs  Lab 07/22/21 0154 07/23/21 0149 07/24/21 0318 07/25/21 0206 07/26/21 0147  NA 130* 127* 129* 129* 127*  K 4.2 4.2 4.4 4.6 4.6  CL 95* 93* 94* 94* 94*  CO2 $Re'26 25 26 27 26  'Olp$ GLUCOSE 135* 131* 184* 182* 152*  BUN 22 25* $Remo'23 23 20  'UbsaQ$ CREATININE 0.96 1.10* 1.01* 1.07* 1.09*  CALCIUM 8.9 8.7* 8.5* 8.5* 8.3*   Liver Function Tests: No results for input(s): "AST", "ALT", "ALKPHOS", "BILITOT", "PROT", "ALBUMIN" in the last 168 hours. CBG: Recent Labs  Lab 07/27/21 0813 07/27/21 1143 07/27/21 1546 07/27/21 2006 07/28/21 0735  GLUCAP 166* 204* 268* 160* 155*    Discharge time spent: greater than 30 minutes.  Signed: Oswald Hillock, MD [facility] 07/28/2021

## 2021-07-28 NOTE — Telephone Encounter (Signed)
Transition Care Management Unsuccessful Follow-up Telephone Call  Date of discharge and from where:  [facility] 07/28/21   Attempts:  1st Attempt  Reason for unsuccessful TCM follow-up call:  Left voice message

## 2021-08-01 DIAGNOSIS — K219 Gastro-esophageal reflux disease without esophagitis: Secondary | ICD-10-CM | POA: Diagnosis not present

## 2021-08-01 DIAGNOSIS — I10 Essential (primary) hypertension: Secondary | ICD-10-CM | POA: Diagnosis not present

## 2021-08-01 DIAGNOSIS — E782 Mixed hyperlipidemia: Secondary | ICD-10-CM | POA: Diagnosis not present

## 2021-08-01 DIAGNOSIS — D62 Acute posthemorrhagic anemia: Secondary | ICD-10-CM | POA: Diagnosis not present

## 2021-08-01 DIAGNOSIS — N1832 Chronic kidney disease, stage 3b: Secondary | ICD-10-CM | POA: Diagnosis not present

## 2021-08-01 DIAGNOSIS — S72402D Unspecified fracture of lower end of left femur, subsequent encounter for closed fracture with routine healing: Secondary | ICD-10-CM | POA: Diagnosis not present

## 2021-08-01 DIAGNOSIS — N390 Urinary tract infection, site not specified: Secondary | ICD-10-CM | POA: Diagnosis not present

## 2021-08-01 DIAGNOSIS — K5904 Chronic idiopathic constipation: Secondary | ICD-10-CM | POA: Diagnosis not present

## 2021-08-01 DIAGNOSIS — E119 Type 2 diabetes mellitus without complications: Secondary | ICD-10-CM | POA: Diagnosis not present

## 2021-08-01 DIAGNOSIS — E871 Hypo-osmolality and hyponatremia: Secondary | ICD-10-CM | POA: Diagnosis not present

## 2021-08-01 DIAGNOSIS — E039 Hypothyroidism, unspecified: Secondary | ICD-10-CM | POA: Diagnosis not present

## 2021-08-01 DIAGNOSIS — F32A Depression, unspecified: Secondary | ICD-10-CM | POA: Diagnosis not present

## 2021-08-04 DIAGNOSIS — E782 Mixed hyperlipidemia: Secondary | ICD-10-CM | POA: Diagnosis not present

## 2021-08-04 DIAGNOSIS — E039 Hypothyroidism, unspecified: Secondary | ICD-10-CM | POA: Diagnosis not present

## 2021-08-04 DIAGNOSIS — K219 Gastro-esophageal reflux disease without esophagitis: Secondary | ICD-10-CM | POA: Diagnosis not present

## 2021-08-04 DIAGNOSIS — E871 Hypo-osmolality and hyponatremia: Secondary | ICD-10-CM | POA: Diagnosis not present

## 2021-08-04 DIAGNOSIS — I10 Essential (primary) hypertension: Secondary | ICD-10-CM | POA: Diagnosis not present

## 2021-08-04 DIAGNOSIS — E119 Type 2 diabetes mellitus without complications: Secondary | ICD-10-CM | POA: Diagnosis not present

## 2021-08-04 DIAGNOSIS — K5904 Chronic idiopathic constipation: Secondary | ICD-10-CM | POA: Diagnosis not present

## 2021-08-04 DIAGNOSIS — S72402D Unspecified fracture of lower end of left femur, subsequent encounter for closed fracture with routine healing: Secondary | ICD-10-CM | POA: Diagnosis not present

## 2021-08-04 DIAGNOSIS — N1832 Chronic kidney disease, stage 3b: Secondary | ICD-10-CM | POA: Diagnosis not present

## 2021-08-05 DIAGNOSIS — N390 Urinary tract infection, site not specified: Secondary | ICD-10-CM | POA: Diagnosis not present

## 2021-08-05 DIAGNOSIS — E782 Mixed hyperlipidemia: Secondary | ICD-10-CM | POA: Diagnosis not present

## 2021-08-05 DIAGNOSIS — S72402D Unspecified fracture of lower end of left femur, subsequent encounter for closed fracture with routine healing: Secondary | ICD-10-CM | POA: Diagnosis not present

## 2021-08-05 DIAGNOSIS — E119 Type 2 diabetes mellitus without complications: Secondary | ICD-10-CM | POA: Diagnosis not present

## 2021-08-05 DIAGNOSIS — E871 Hypo-osmolality and hyponatremia: Secondary | ICD-10-CM | POA: Diagnosis not present

## 2021-08-05 DIAGNOSIS — M6281 Muscle weakness (generalized): Secondary | ICD-10-CM | POA: Diagnosis not present

## 2021-08-05 DIAGNOSIS — E038 Other specified hypothyroidism: Secondary | ICD-10-CM | POA: Diagnosis not present

## 2021-08-05 DIAGNOSIS — I10 Essential (primary) hypertension: Secondary | ICD-10-CM | POA: Diagnosis not present

## 2021-08-05 DIAGNOSIS — K219 Gastro-esophageal reflux disease without esophagitis: Secondary | ICD-10-CM | POA: Diagnosis not present

## 2021-08-05 DIAGNOSIS — K5904 Chronic idiopathic constipation: Secondary | ICD-10-CM | POA: Diagnosis not present

## 2021-08-05 DIAGNOSIS — N1832 Chronic kidney disease, stage 3b: Secondary | ICD-10-CM | POA: Diagnosis not present

## 2021-08-05 DIAGNOSIS — D62 Acute posthemorrhagic anemia: Secondary | ICD-10-CM | POA: Diagnosis not present

## 2021-08-07 DIAGNOSIS — I10 Essential (primary) hypertension: Secondary | ICD-10-CM | POA: Diagnosis not present

## 2021-08-07 DIAGNOSIS — E785 Hyperlipidemia, unspecified: Secondary | ICD-10-CM | POA: Diagnosis not present

## 2021-08-08 DIAGNOSIS — D62 Acute posthemorrhagic anemia: Secondary | ICD-10-CM | POA: Diagnosis not present

## 2021-08-08 DIAGNOSIS — S72402D Unspecified fracture of lower end of left femur, subsequent encounter for closed fracture with routine healing: Secondary | ICD-10-CM | POA: Diagnosis not present

## 2021-08-08 DIAGNOSIS — E038 Other specified hypothyroidism: Secondary | ICD-10-CM | POA: Diagnosis not present

## 2021-08-08 DIAGNOSIS — E119 Type 2 diabetes mellitus without complications: Secondary | ICD-10-CM | POA: Diagnosis not present

## 2021-08-08 DIAGNOSIS — I10 Essential (primary) hypertension: Secondary | ICD-10-CM | POA: Diagnosis not present

## 2021-08-08 DIAGNOSIS — E871 Hypo-osmolality and hyponatremia: Secondary | ICD-10-CM | POA: Diagnosis not present

## 2021-08-08 DIAGNOSIS — N1832 Chronic kidney disease, stage 3b: Secondary | ICD-10-CM | POA: Diagnosis not present

## 2021-08-08 DIAGNOSIS — E039 Hypothyroidism, unspecified: Secondary | ICD-10-CM | POA: Diagnosis not present

## 2021-08-08 DIAGNOSIS — E782 Mixed hyperlipidemia: Secondary | ICD-10-CM | POA: Diagnosis not present

## 2021-08-08 DIAGNOSIS — K219 Gastro-esophageal reflux disease without esophagitis: Secondary | ICD-10-CM | POA: Diagnosis not present

## 2021-08-08 DIAGNOSIS — K5904 Chronic idiopathic constipation: Secondary | ICD-10-CM | POA: Diagnosis not present

## 2021-08-11 DIAGNOSIS — E039 Hypothyroidism, unspecified: Secondary | ICD-10-CM | POA: Diagnosis not present

## 2021-08-11 DIAGNOSIS — E119 Type 2 diabetes mellitus without complications: Secondary | ICD-10-CM | POA: Diagnosis not present

## 2021-08-11 DIAGNOSIS — F32A Depression, unspecified: Secondary | ICD-10-CM | POA: Diagnosis not present

## 2021-08-11 DIAGNOSIS — D62 Acute posthemorrhagic anemia: Secondary | ICD-10-CM | POA: Diagnosis not present

## 2021-08-11 DIAGNOSIS — E871 Hypo-osmolality and hyponatremia: Secondary | ICD-10-CM | POA: Diagnosis not present

## 2021-08-11 DIAGNOSIS — N390 Urinary tract infection, site not specified: Secondary | ICD-10-CM | POA: Diagnosis not present

## 2021-08-11 DIAGNOSIS — K219 Gastro-esophageal reflux disease without esophagitis: Secondary | ICD-10-CM | POA: Diagnosis not present

## 2021-08-11 DIAGNOSIS — N1832 Chronic kidney disease, stage 3b: Secondary | ICD-10-CM | POA: Diagnosis not present

## 2021-08-11 DIAGNOSIS — K5904 Chronic idiopathic constipation: Secondary | ICD-10-CM | POA: Diagnosis not present

## 2021-08-11 DIAGNOSIS — S72402D Unspecified fracture of lower end of left femur, subsequent encounter for closed fracture with routine healing: Secondary | ICD-10-CM | POA: Diagnosis not present

## 2021-08-11 DIAGNOSIS — E782 Mixed hyperlipidemia: Secondary | ICD-10-CM | POA: Diagnosis not present

## 2021-08-11 DIAGNOSIS — I10 Essential (primary) hypertension: Secondary | ICD-10-CM | POA: Diagnosis not present

## 2021-08-12 DIAGNOSIS — S7292XD Unspecified fracture of left femur, subsequent encounter for closed fracture with routine healing: Secondary | ICD-10-CM | POA: Diagnosis not present

## 2021-08-22 DIAGNOSIS — N390 Urinary tract infection, site not specified: Secondary | ICD-10-CM | POA: Diagnosis not present

## 2021-08-22 DIAGNOSIS — K219 Gastro-esophageal reflux disease without esophagitis: Secondary | ICD-10-CM | POA: Diagnosis not present

## 2021-08-22 DIAGNOSIS — K5904 Chronic idiopathic constipation: Secondary | ICD-10-CM | POA: Diagnosis not present

## 2021-08-22 DIAGNOSIS — N1832 Chronic kidney disease, stage 3b: Secondary | ICD-10-CM | POA: Diagnosis not present

## 2021-08-22 DIAGNOSIS — E871 Hypo-osmolality and hyponatremia: Secondary | ICD-10-CM | POA: Diagnosis not present

## 2021-08-22 DIAGNOSIS — D62 Acute posthemorrhagic anemia: Secondary | ICD-10-CM | POA: Diagnosis not present

## 2021-08-22 DIAGNOSIS — M6281 Muscle weakness (generalized): Secondary | ICD-10-CM | POA: Diagnosis not present

## 2021-08-22 DIAGNOSIS — E119 Type 2 diabetes mellitus without complications: Secondary | ICD-10-CM | POA: Diagnosis not present

## 2021-08-22 DIAGNOSIS — E782 Mixed hyperlipidemia: Secondary | ICD-10-CM | POA: Diagnosis not present

## 2021-08-22 DIAGNOSIS — E039 Hypothyroidism, unspecified: Secondary | ICD-10-CM | POA: Diagnosis not present

## 2021-08-22 DIAGNOSIS — S72402D Unspecified fracture of lower end of left femur, subsequent encounter for closed fracture with routine healing: Secondary | ICD-10-CM | POA: Diagnosis not present

## 2021-08-22 DIAGNOSIS — I10 Essential (primary) hypertension: Secondary | ICD-10-CM | POA: Diagnosis not present

## 2021-08-25 ENCOUNTER — Ambulatory Visit (INDEPENDENT_AMBULATORY_CARE_PROVIDER_SITE_OTHER): Payer: PPO | Admitting: Pharmacist

## 2021-08-25 ENCOUNTER — Ambulatory Visit: Payer: Self-pay

## 2021-08-25 ENCOUNTER — Other Ambulatory Visit: Payer: Self-pay | Admitting: Family Medicine

## 2021-08-25 DIAGNOSIS — I10 Essential (primary) hypertension: Secondary | ICD-10-CM

## 2021-08-25 DIAGNOSIS — N1831 Chronic kidney disease, stage 3a: Secondary | ICD-10-CM

## 2021-08-25 DIAGNOSIS — Z8781 Personal history of (healed) traumatic fracture: Secondary | ICD-10-CM

## 2021-08-25 DIAGNOSIS — E1122 Type 2 diabetes mellitus with diabetic chronic kidney disease: Secondary | ICD-10-CM

## 2021-08-25 DIAGNOSIS — N390 Urinary tract infection, site not specified: Secondary | ICD-10-CM

## 2021-08-25 MED ORDER — CEPHALEXIN 250 MG PO TABS: 250.0000 mg | ORAL_TABLET | Freq: Every day | ORAL | 3 refills | Status: DC

## 2021-08-25 MED ORDER — METFORMIN HCL 500 MG PO TABS: 500.0000 mg | ORAL_TABLET | Freq: Two times a day (BID) | ORAL | 3 refills | Status: DC

## 2021-08-25 MED ORDER — AMLODIPINE BESYLATE 5 MG PO TABS: 5.0000 mg | ORAL_TABLET | Freq: Every day | ORAL | 3 refills | Status: DC

## 2021-08-25 NOTE — Chronic Care Management (AMB) (Unsigned)
Chronic Care Management CCM Pharmacy Note  08/25/2021 Name:  Lisa Mills MRN:  [id number] DOB:  [birth year]   Subjective: Lisa Mills is an 88 y.o. year old female who is a primary patient of [name], DO.  The CCM team was consulted for assistance with disease management and care coordination needs.    Engaged with patient by telephone for follow up visit for pharmacy case management and/or care coordination services.   Objective:  Medications Reviewed Today     Reviewed by Manuela Neptune, RPH-CPP (Pharmacist) on 08/25/21 at 1353  Med List Status: <None>   Medication Order Taking? Sig Documenting Provider Last Dose Status Informant  amLODipine (NORVASC) 5 MG tablet [id number]  Take 1 tablet (5 mg total) by mouth daily. Meredeth Ide, MD  Active   apixaban Everlene Balls) 2.5 MG TABS tablet [id number] Yes Take 1 tablet (2.5 mg total) by mouth 2 (two) times daily. [place] Bali, PA-C Taking Active   Blood Glucose Monitoring Suppl (ONE TOUCH ULTRA 2) w/Device KIT [id number]  Use to check blood sugar as advised up to 2 times daily [name], DO  Active Nursing Home Medication Administration Guide (MAG)  Cephalexin 250 MG tablet [id number]  Take 1 tablet (250 mg total) by mouth daily in the afternoon. Wouk, Wilfred Curtis, MD  Active Nursing Home Medication Administration Guide (MAG)           Med Note Vickey Sages, Macarthur Critchley   Tue Jul 22, 2021 12:25 PM) Continuous   Cholecalciferol (VITAMIN D) 50 MCG (2000 UT) CAPS [id number] Yes Take 2,000 Units by mouth daily.  [provider] Taking Active Nursing Home Medication Administration Guide (MAG)  cyanocobalamin (VITAMIN B12) 1000 MCG tablet [id number] Yes Take 1,000 mcg by mouth daily. [provider] Taking Active   escitalopram (LEXAPRO) 10 MG tablet [id number] Yes Take 1 tablet (10 mg total) by mouth daily. with food [name], DO Taking Active Nursing Home Medication Administration  Guide (MAG)  feeding supplement (ENSURE ENLIVE / ENSURE PLUS) LIQD [id number]  Take 237 mLs by mouth 2 (two) times daily between meals. Meredeth Ide, MD  Active   ferrous sulfate 325 (65 FE) MG tablet [id number]  Take 325 mg by mouth daily.  [provider]  Active Nursing Home Medication Administration Guide (MAG)  glucose blood (ONE TOUCH ULTRA TEST) test strip [id number]  CHECK BLOOD SUGAR UP TO 2 TIMES A DAY. [name], DO  Active Nursing Home Medication Administration Guide (MAG)  glycerin adult 2 g suppository [id number]  Place 1 suppository rectally as needed for constipation. [provider]  Active Nursing Home Medication Administration Guide (MAG)  Histamine Dihydrochloride (AUSTRALIAN DREAM ARTHRITIS) 0.025 % CREA [id number]  Apply 1 application topically daily as needed (Pain). [provider]  Active Nursing Home Medication Administration Guide (MAG)  HYDROcodone-acetaminophen (NORCO/VICODIN) 5-325 MG tablet [id number] No Take 1 tablet by mouth every 4 (four) hours as needed for severe pain.  Patient not taking: Reported on 08/25/2021   [place] Bali, PA-C Not Taking Active   hydrocortisone (ANUSOL-HC) 2.5 % rectal cream [id number]  USE 1 APPLICATION RECTALLY TWICE DAILY AS DIRECTED  Patient not taking: Reported on 07/22/2021   [name], DO  Active Nursing Home Medication Administration Guide (MAG)  hydrocortisone (ANUSOL-HC) 25 MG suppository [id number]  Place 25 mg rectally 2 (two) times daily. [provider]  Active Nursing Home Medication Administration Guide (MAG)  levothyroxine (SYNTHROID) 25 MCG tablet  [id number] Yes Take 1 tablet (25 mcg total) by mouth daily before breakfast. Olin Hauser, DO Taking Active Nursing Home Medication Administration Guide (MAG)  lisinopril (ZESTRIL) 5 MG tablet [id number] Yes TAKE 1 TABLET BY MOUTH ONCE DAILY  Patient taking differently: Take 5 mg by mouth daily.   Olin Hauser, DO Taking Active Nursing Home Medication Administration Guide (MAG)  metFORMIN (GLUCOPHAGE) 500 MG tablet [id number] Yes Take 1 tablet (500 mg total) by mouth 2 (two) times daily with a meal. Darrick Meigs, Marge Duncans, MD Taking Active   omeprazole (PRILOSEC) 20 MG capsule [id number] Yes TAKE 1 CAPSULE BY MOUTH ONCE DAILY  Patient taking differently: Take 20 mg by mouth daily before breakfast.   Olin Hauser, DO Taking Active Nursing Home Medication Administration Guide (MAG)  OneTouch Delica Lancets 83T MISC [id number]  Use to check blood sugar up to 2 times daily Olin Hauser, DO  Active Nursing Home Medication Administration Guide (MAG)  polyethylene glycol (MIRALAX / GLYCOLAX) 17 g packet [id number]  Take 17 g by mouth daily as needed for moderate constipation or severe constipation.  Patient taking differently: Take 17 g by mouth daily as needed (constipation).   Swayze, Ava, DO  Active Nursing Home Medication Administration Guide (MAG)  senna (SENOKOT) 8.6 MG TABS tablet [id number]  Take 1 tablet (8.6 mg total) by mouth daily. Wouk, Ailene Rud, MD  Active Nursing Home Medication Administration Guide (MAG)  simvastatin (ZOCOR) 40 MG tablet [id number] Yes TAKE 1 TABLET BY MOUTH AT BEDTIME  Patient taking differently: Take 40 mg by mouth at bedtime.   Olin Hauser, DO Taking Active Nursing Home Medication Administration Guide (MAG)  sodium chloride 1 g tablet [id number] No Take 1 tablet (1 g total) by mouth 2 (two) times daily with a meal.  Patient not taking: Reported on 08/25/2021   Gwynne Edinger, MD Not Taking Active Nursing Home Medication Administration Guide (MAG)  witch hazel-glycerin (TUCKS) pad [id number]  1 application  See admin instructions. Apply topically to rectum daily as needed for itching [provider]  Active Nursing Home Medication Administration Guide (MAG)  Med List Note Payton Doughty, CPhT 07/22/21 1053): Resident of [employer] and [place] [phone]            Pertinent Labs:  Lab Results  Component Value Date   HGBA1C 7.5 (H) 03/14/2021   Lab Results  Component Value Date   CHOL 193 03/14/2021   HDL 75 03/14/2021   LDLCALC 98 03/14/2021   TRIG 100 03/14/2021   CHOLHDL 2.6 03/14/2021   Lab Results  Component Value Date   CREATININE 1.09 (H) 07/26/2021   BUN 20 07/26/2021   NA 127 (L) 07/26/2021   K 4.6 07/26/2021   CL 94 (L) 07/26/2021   CO2 26 07/26/2021    SDOH:  (Social Determinants of Health) assessments and interventions performed:    [place]  Review of patient past medical history, allergies, medications, health status, including review of consultants reports, laboratory and other test data, was performed as part of comprehensive evaluation and provision of chronic care management services.   Care Plan : PharmD - Medication Adherence  Updates made by Rennis Petty, RPH-CPP since 08/25/2021 12:00 AM     Problem: Disease Progression      Long-Range Goal: Disease Progression Prevented or Minimized   Start Date: 08/25/2021  Expected End Date: 09/24/2021  This Visit's Progress: On track  Priority: High  Note:  Current Barriers:  Unable to self administer medications as prescribed Patient expresses confusion regarding current medication list  Pharmacist Clinical Goal(s):  patient will achieve adherence to monitoring guidelines and medication adherence to achieve therapeutic efficacy through collaboration with PharmD and provider.    Interventions: 1:1 collaboration with Olin Hauser, DO regarding development and update of comprehensive plan of care as evidenced by provider attestation and co-signature Inter-disciplinary care team collaboration (see longitudinal plan of care) Perform chart review Patient admitted to [facility] on 07/21/2021 related to closed fracture of left femur. Discharged to [facility]  on 07/28/2021 New?Medications Started at Hospital Discharge: Amlodipine 5 mg daily Eliquis 2.5 mg twice daily Feeding supplement twice daily between meals Metformin 500 mg twice daily with meals Medication Changed at Hospital Discharge: Hydrocodone-acetaminophen 5-325 mg changed to 1 tablet every 4 hours as needed for severe pain Medications Discontinued at Hospital Discharge: Lovenox Receive message from PCP advising patient contacted office today regarding questions about her current medication list. Requests CM Pharmacist outreach to patient to provide assistance with medication reconciliation. Outreach to patient today by phone. Reports she was discharged from SNF on Friday. Attempt to complete comprehensive medication review with patient today; medication list updated as possible in electronic medical record Request to reach out to son to collaborate with helping patient to manage her medications/review of medications, but patient declines Collaborate with Collaborate with Levada Dy, LPN at [facility](SNF) and verbally receive a list of patient's medications at discharge: Acetaminophen 325 mg -2 tablets every 6 hours as needed Amlodipine 5 mg daily Cephalexin 250 mg daily Eliquis 2.5 mg twice daily (until 8/9 per orthopedics) Escitalopram 10 mg daily Ferrous sulfate 325 mg daily Glycerin suppository as needed Hydrocodone 5-325 mg q4h as needed Levothyroxine 25 mcg daily Lisinopril 5 mg daily Melatonin 3 mg QHS as needed Metformin 500 mg twice daily Miralax 17 grams daily as needed Omeprazole 20 mg daily Senna 8.6 mg daily Simvastatin 40 mg QHS Sodium chloride 1 gram twice daily **Reports patient refused to take this while at facility** Vitamin D 50 mcg daily From review of chart, note at hospital discharge from [facility] on 6/27, patient advised to stop Macrobid and switch to cephalexin for daily recurrent UTI prevention Counsel patient on this change again  today and advised to continue cephalexin Rx as directed From review of chart, note at hospital discharge from [facility] on 6/27, patient advised to start sodium chloride 1 gram twice daily for hyponatremia Note per Levada Dy at [facility], patient refused to take sodium chloride while at this facility.  Today patient states that she does not plan to take it at home either, despite the indication Place coordination of care call to [facility] [phone]) to request clarification from Dr. Doreatha Martin for the planned duration of treatment for patient's Eliquis. Leave a message with Mendel Ryder requesting a call back Per LPN at [facility], patient to start receiving home health PT/OT for strengthening and aid for ADLs from [facility] Today patient states that she is waiting on a call for starting these services. Offer patient phone number for [facility], but patient states if does not hear from home health, will instead call SNF back Advise patient to contact office to schedule post-discharge follow up appointment with PCP, but patient states it would be too difficult for her to get to office for this appointment due to her leg Will collaborate with PCP  Collaborated with CCM Nurse Case Manager  Hypertension: Current treatment: Lisinopril 5 mg daily Amlodipine 5 mg daily LPN at [facility], provides patient's latest facility BP readings: 8/4 160/56 8/3: 141/63; 133/51 8/2: 115/60; 122/55; 140/64 Today patient asks about whether she was to continue amlodipine. Advise patient to continue amlodipine 5 mg daily as directed   Patient Goals/Self-Care Activities patient will:  - take medications as prescribed as evidenced by patient report and record review        Plan: {CM FOLLOW UP PLAN:25073}  ***

## 2021-08-25 NOTE — Telephone Encounter (Signed)
I have contacted Wallace Cullens [facility] CPP to do a med rec with patient and re-establish with CCM Team.  Patient should schedule a Hospital Follow-up with me as well.  Nobie Putnam, [facility] 08/25/2021, 11:37 AM

## 2021-08-25 NOTE — Telephone Encounter (Signed)
  Chief Complaint: Needs to confirm current medications Symptoms: Unsure what to take Frequency: ongoing Pertinent Negatives: Patient denies  Disposition: '[]'$ ED /'[]'$ Urgent Care (no appt availability in office) / '[]'$ Appointment(In office/virtual)/ '[]'$  [facility]/ '[]'$ Home Care/ '[]'$ Refused Recommended Disposition /'[]'$ [place] [facility]/ '[x]'$  Follow-up with PCP Additional Notes: Pt will take those medications that were prescribed by Dr. Parks Ranger . Pt would like Dr. Parks Ranger to review current med list and advise which medications she should be taking at this time.   Summary: pls call pt release from rehab Fri, not taking meds b/c she does not know what to take   PT just got out of Rehab center, broke leg,  just got home Friday, not taken meds and does not know at all what kind of meds to take, she has all these meds,  states overwhelmed, she is not going to take meds till hears from Dr Raliegh Ip b/c they are in addition what Dr Raliegh Ip had her on, states nursing home had faxed over info to him, but this am she does not have a clue what to take, wants to know if she can get [facility] also. Pls call pt did reiterate told not to take any meds till nurse called back since unsure. Pt states she will not and  why waited to call today, will wait for call, pt says to call [phone]. She states hard to get to the phone so pls let it ring.      Reason for Disposition  [1] Caller has URGENT medicine question about med that PCP or specialist prescribed AND [2] triager unable to answer question  Answer Assessment - Initial Assessment Questions 1. NAME of MEDICINE: "What medicine(s) are you calling about?"     Wants Dr. Parks Ranger to review new medications and approve them 2. QUESTION: "What is your question?" (e.g., double dose of medicine, side effect)     Which meds should she be taking? 3. PRESCRIBER: "Who prescribed the medicine?" Reason: if prescribed by specialist, call should be referred to that group.       4. SYMPTOMS: "Do you have any symptoms?" If Yes, ask: "What symptoms are you having?"  "How bad are the symptoms (e.g., mild, moderate, severe)     non 5. PREGNANCY:  "Is there any chance that you are pregnant?" "When was your last menstrual period?"     a  Protocols used: Medication Question Call-A-AH

## 2021-08-26 ENCOUNTER — Telehealth: Payer: Self-pay

## 2021-08-26 DIAGNOSIS — E782 Mixed hyperlipidemia: Secondary | ICD-10-CM | POA: Diagnosis not present

## 2021-08-26 DIAGNOSIS — E1122 Type 2 diabetes mellitus with diabetic chronic kidney disease: Secondary | ICD-10-CM | POA: Diagnosis not present

## 2021-08-26 DIAGNOSIS — Z87442 Personal history of urinary calculi: Secondary | ICD-10-CM | POA: Diagnosis not present

## 2021-08-26 DIAGNOSIS — Z7984 Long term (current) use of oral hypoglycemic drugs: Secondary | ICD-10-CM | POA: Diagnosis not present

## 2021-08-26 DIAGNOSIS — N390 Urinary tract infection, site not specified: Secondary | ICD-10-CM | POA: Diagnosis not present

## 2021-08-26 DIAGNOSIS — K219 Gastro-esophageal reflux disease without esophagitis: Secondary | ICD-10-CM | POA: Diagnosis not present

## 2021-08-26 DIAGNOSIS — H538 Other visual disturbances: Secondary | ICD-10-CM | POA: Diagnosis not present

## 2021-08-26 DIAGNOSIS — M9702XD Periprosthetic fracture around internal prosthetic left hip joint, subsequent encounter: Secondary | ICD-10-CM | POA: Diagnosis not present

## 2021-08-26 DIAGNOSIS — G2581 Restless legs syndrome: Secondary | ICD-10-CM | POA: Diagnosis not present

## 2021-08-26 DIAGNOSIS — I129 Hypertensive chronic kidney disease with stage 1 through stage 4 chronic kidney disease, or unspecified chronic kidney disease: Secondary | ICD-10-CM | POA: Diagnosis not present

## 2021-08-26 DIAGNOSIS — Z9181 History of falling: Secondary | ICD-10-CM | POA: Diagnosis not present

## 2021-08-26 DIAGNOSIS — N1832 Chronic kidney disease, stage 3b: Secondary | ICD-10-CM | POA: Diagnosis not present

## 2021-08-26 DIAGNOSIS — E871 Hypo-osmolality and hyponatremia: Secondary | ICD-10-CM | POA: Diagnosis not present

## 2021-08-26 DIAGNOSIS — F32A Depression, unspecified: Secondary | ICD-10-CM | POA: Diagnosis not present

## 2021-08-26 DIAGNOSIS — E039 Hypothyroidism, unspecified: Secondary | ICD-10-CM | POA: Diagnosis not present

## 2021-08-26 DIAGNOSIS — K5904 Chronic idiopathic constipation: Secondary | ICD-10-CM | POA: Diagnosis not present

## 2021-08-26 DIAGNOSIS — D62 Acute posthemorrhagic anemia: Secondary | ICD-10-CM | POA: Diagnosis not present

## 2021-08-26 DIAGNOSIS — S72402D Unspecified fracture of lower end of left femur, subsequent encounter for closed fracture with routine healing: Secondary | ICD-10-CM | POA: Diagnosis not present

## 2021-08-26 DIAGNOSIS — E46 Unspecified protein-calorie malnutrition: Secondary | ICD-10-CM | POA: Diagnosis not present

## 2021-08-26 DIAGNOSIS — Z4789 Encounter for other orthopedic aftercare: Secondary | ICD-10-CM | POA: Diagnosis not present

## 2021-08-26 DIAGNOSIS — M8580 Other specified disorders of bone density and structure, unspecified site: Secondary | ICD-10-CM | POA: Diagnosis not present

## 2021-08-26 NOTE — Patient Instructions (Signed)
Visit Information  Thank you for taking time to visit with me today. Please don't hesitate to contact me if I can be of assistance to you before our next scheduled telephone appointment.  Following are the goals we discussed today:   Goals Addressed             This Visit's Progress    PharmD Goals       Please check your home blood pressure, keep a log of the results and bring this with you to your medical appointments.  Thank you!  Wallace Cullens, PharmD, Para March, CPP Clinical Pharmacist [facility] [phone]          Please call the care guide team at [phone] if you need to cancel or reschedule your appointment.    The patient verbalized understanding of instructions, educational materials, and care plan provided today and DECLINED offer to receive copy of patient instructions, educational materials, and care plan.

## 2021-08-26 NOTE — Telephone Encounter (Unsigned)
Copied from [place] [phone]. Topic: General - Other >> Aug 26, 2021  3:29 PM Tiffany B wrote: Caller requesting verbal orders for Nursing PT and OT  Also caller wanted to know if patient is in need of a hospital follow up with PCP and if so please call patient

## 2021-08-27 NOTE — Telephone Encounter (Signed)
Okay for verbal orders!  Technically we ask all patients to arrange hospital follow-up. Ultimately that is up to the patient.  I would always suggest it is wise to do a HFU apt, especially if she needs anything at this time from [place].  Nobie Putnam, DO [facility] 08/27/2021, 1:11 PM

## 2021-08-28 ENCOUNTER — Telehealth: Payer: Self-pay | Admitting: *Deleted

## 2021-08-28 NOTE — Telephone Encounter (Signed)
Prior authorization was required and submitted on 8/7 which may take 5-7 business days to receive a notification.

## 2021-08-28 NOTE — Telephone Encounter (Signed)
Patient is calling to verify that Amlodipine '5mg'$  is a medication her PCP has prescribed for her.  Patient advised per current medication list- yes this medication is current and prescribed by provider. Patient was thankful and states that is all she needed to know.

## 2021-08-28 NOTE — Telephone Encounter (Signed)
Received fax from [facility] stating that the medication does not actually require a PA because it is a tier 1 medication.

## 2021-08-29 ENCOUNTER — Telehealth: Payer: Self-pay | Admitting: Family Medicine

## 2021-08-29 NOTE — Telephone Encounter (Signed)
Message left for [name] on provided number.

## 2021-08-29 NOTE — Telephone Encounter (Signed)
Home Health Verbal Orders - Caller/Agency: Angie from [facility] Number: [phone] Requesting PT Frequency: 1x1 2x4 1x4

## 2021-08-29 NOTE — Telephone Encounter (Signed)
Okay to proceed w verbal  Nobie Putnam, [facility] 08/29/2021, 3:50 PM

## 2021-08-29 NOTE — Telephone Encounter (Signed)
Lisa Mills is aware.

## 2021-09-01 ENCOUNTER — Ambulatory Visit: Payer: Self-pay

## 2021-09-01 ENCOUNTER — Telehealth: Payer: PPO

## 2021-09-01 NOTE — Patient Instructions (Signed)
Visit Information  Thank you for taking time to visit with me today. Please don't hesitate to contact me if I can be of assistance to you.   Following are the goals we discussed today:   Goals Addressed             This Visit's Progress    RNCM: Effective Management of Pain       Care Coordination Interventions: 09-01-2021: The patient is doing well since coming home from rehab after a fall with fracture to left femur on 07-15-2021.  She says that she is having pain and rates at a 6/7 when it is at its worse. She will see pcp this week for post hospital follow up Reviewed provider established plan for pain management. 09-01-2021: The patient has had an evaluation from home health and will start PT in her home for at least 2 days a week for a month and then one day a week for a month. The patient states that she feels she is doing well with walking with her walker she is just slow. She denies any acute distress at this time. Her son comes and checks on her frequently.  Discussed importance of adherence to all scheduled medical appointments. Sees pcp on 09-03-2021 and will see the surgeon on the following week for follow up.  Counseled on the importance of reporting any/all new or changed pain symptoms or management strategies to pain management provider Advised patient to report to care team affect of pain on daily activities. 09-01-2021: The patient is thankful to be home post fall with surgical intervention to  her left hip/leg. She states that she is being safe and walking with her walker. The patient denies any acute distress today.  Medication review: 09-01-2021: The patient has adequate pain medications but does not like to take the pain medication unless she absolutely needs it. The patient states that she has her medications fixed now and is taking as directed. Did have questions about the topical cream she is using for pain relief. Education on bringing her medications with her when she comes  for her visit on Wednesday to see the pcp. Support given. Will continue to monitor.  Discussed use of relaxation techniques and/or diversional activities to assist with pain reduction (distraction, imagery, relaxation, massage, acupressure, TENS, heat, and cold application Reviewed with patient prescribed pharmacological and nonpharmacological pain relief strategies Advised patient to discuss unresolved pain, changes in level or intensity of pain, new needs related to PT, changes in other chronic conditions with provider Screening for signs and symptoms of depression related to chronic disease state  Assessed social determinant of health barriers AWV last in November of 2022 will send email to get patient scheduled for new AWV for physical year 2023.            Our next appointment is by telephone on 10-13-2021 at 1030 am  Please call the care guide team at [phone] if you need to cancel or reschedule your appointment.   If you are experiencing a Mental Health or [place] or need someone to talk to, please call the Suicide and Crisis Lifeline: 988 call the [place] National Suicide Prevention Lifeline: 217-587-4910 or TTY: 959-735-9211 TTY 813-473-3597) to talk to a trained counselor call 1-800-273-TALK (toll free, 24 hour hotline)  Patient verbalizes understanding of instructions and care plan provided today and agrees to view in [place]. Active MyChart status and patient understanding of how to access instructions and care plan via MyChart confirmed  with patient.     Telephone follow up appointment with care management team member scheduled for: 10-13-2021 at 44 am  [place], MSN, [facility] Mobile: [phone]

## 2021-09-01 NOTE — Patient Outreach (Signed)
Care Coordination   Follow Up Visit Note   09/01/2021 Name: Lisa Mills MRN: [id number] DOB: 08-14-1929  IZZIE Mills is a 88 y.o. year old female who sees Lisa Hauser, DO for primary care. I spoke with  Lisa Mills by phone today  What matters to the patients health and wellness today?  Making sure she can get around good post sx and rehab and medications are being taken as prescribed.     Goals Addressed             This Visit's Progress    RNCM: Effective Management of Pain       Care Coordination Interventions: 09-01-2021: The patient is doing well since coming home from rehab after a fall with fracture to left femur on 07-15-2021.  She says that she is having pain and rates at a 6/7 when it is at its worse. She will see pcp this week for post hospital follow up Reviewed provider established plan for pain management. 09-01-2021: The patient has had an evaluation from home health and will start PT in her home for at least 2 days a week for a month and then one day a week for a month. The patient states that she feels she is doing well with walking with her walker she is just slow. She denies any acute distress at this time. Her son comes and checks on her frequently.  Discussed importance of adherence to all scheduled medical appointments. Sees pcp on 09-03-2021 and will see the surgeon on the following week for follow up.  Counseled on the importance of reporting any/all new or changed pain symptoms or management strategies to pain management provider Advised patient to report to care team affect of pain on daily activities. 09-01-2021: The patient is thankful to be home post fall with surgical intervention to  her left hip/leg. She states that she is being safe and walking with her walker. The patient denies any acute distress today.  Medication review: 09-01-2021: The patient has adequate pain medications but does not like to take the pain medication unless she  absolutely needs it. The patient states that she has her medications fixed now and is taking as directed. Did have questions about the topical cream she is using for pain relief. Education on bringing her medications with her when she comes for her visit on Wednesday to see the pcp. Support given. Will continue to monitor.  Discussed use of relaxation techniques and/or diversional activities to assist with pain reduction (distraction, imagery, relaxation, massage, acupressure, TENS, heat, and cold application Reviewed with patient prescribed pharmacological and nonpharmacological pain relief strategies Advised patient to discuss unresolved pain, changes in level or intensity of pain, new needs related to PT, changes in other chronic conditions with provider Screening for signs and symptoms of depression related to chronic disease state  Assessed social determinant of health barriers AWV last in November of 2022 will send email to get patient scheduled for new AWV for physical year 2023.            SDOH assessments and interventions completed:  Yes  SDOH Interventions Today    Flowsheet Row Most Recent Value  SDOH Interventions   Food Insecurity Interventions Intervention Not Indicated  Financial Strain Interventions Intervention Not Indicated  Housing Interventions Intervention Not Indicated  Physical Activity Interventions Other (Comments)  [working with PT post hospital/rehab for hip fracture]  Stress Interventions Intervention Not Indicated, Other (Comment)  [little stressed due to post  hip fracture]  Social Connections Interventions Intervention Not Indicated, Other (Comment)  [good family support]  Transportation Interventions Intervention Not Indicated        Care Coordination Interventions Activated:  Yes  Care Coordination Interventions:  Yes, provided   Follow up plan: Follow up call scheduled for 10-13-2021 at 1030 am    Encounter Outcome:  Pt. Visit Completed   Noreene Larsson RN, MSN, [facility] Mobile: [phone]

## 2021-09-03 ENCOUNTER — Ambulatory Visit (INDEPENDENT_AMBULATORY_CARE_PROVIDER_SITE_OTHER): Payer: PPO | Admitting: Family Medicine

## 2021-09-03 ENCOUNTER — Encounter: Payer: Self-pay | Admitting: Family Medicine

## 2021-09-03 VITALS — BP 111/50 | HR 88 | Ht 59.0 in | Wt 102.0 lb

## 2021-09-03 DIAGNOSIS — R634 Abnormal weight loss: Secondary | ICD-10-CM

## 2021-09-03 DIAGNOSIS — N1831 Chronic kidney disease, stage 3a: Secondary | ICD-10-CM

## 2021-09-03 DIAGNOSIS — M158 Other polyosteoarthritis: Secondary | ICD-10-CM | POA: Diagnosis not present

## 2021-09-03 DIAGNOSIS — K5903 Drug induced constipation: Secondary | ICD-10-CM | POA: Diagnosis not present

## 2021-09-03 DIAGNOSIS — Z8781 Personal history of (healed) traumatic fracture: Secondary | ICD-10-CM

## 2021-09-03 DIAGNOSIS — E1122 Type 2 diabetes mellitus with diabetic chronic kidney disease: Secondary | ICD-10-CM

## 2021-09-03 MED ORDER — ONETOUCH DELICA LANCETS 33G MISC: 12 refills | Status: AC

## 2021-09-03 MED ORDER — GLUCOSE BLOOD VI STRP
ORAL_STRIP | 11 refills | Status: DC
Start: 2021-09-03 — End: 2023-02-05

## 2021-09-03 MED ORDER — ONETOUCH ULTRA 2 W/DEVICE KIT: PACK | 0 refills | Status: AC

## 2021-09-03 NOTE — Progress Notes (Signed)
Subjective:    Patient ID: Lisa Mills, female    DOB: 04/04/1929, 88 y.o.   MRN: [id number]  Lisa Mills is a 88 y.o. female presenting on 09/03/2021 for Hospitalization Follow-up   HPI  HOSPITAL FOLLOW-UP VISIT  Hospital/Location: [facility] Date of Admission: 07/21/21 Date of Discharge: 07/28/21 Transitions of care telephone call: 1st attempt, not completed.  Reason for Admission: Left Femur Fracture, s/p surgery  Periprosthetic Left femoral Shaft Fracture  - Hospital H&P and Discharge Summary have been reviewed - Patient presents today 5 weeks after recent hospitalization. Brief summary of recent course, patient had symptoms of of Left leg pain after fall fracture 07/12/21 admit and had 6/25 surgical repair and was doing PT, she ultimately had issue with hardware coming loose in left leg, with fracture, they sent her to [facility] for ortho management on 07/21/21 and they did revision.  Home Health, PT, family helping [facility] did phone med rec. Now meds organized.  Some reduced PO intake. Now she reports improved protein supplement. She has increased some sodium to maintain fluid and balance She is eating meat and vegetables.  She admits constipation bothering her.  On miralax some mixed results, it does work but bothers her.  - Today reports overall has done well after discharge. Symptoms of Left leg hip have gradually improved but still doing therapy.  - New medications on discharge: updated - Changes to current meds on discharge: none  I have reviewed the discharge medication list, and have reconciled the current and discharge medications today.   Current Outpatient Medications:    amLODipine (NORVASC) 5 MG tablet, Take 1 tablet (5 mg total) by mouth daily., Disp: 90 tablet, Rfl: 3   Cephalexin 250 MG tablet, Take 1 tablet (250 mg total) by mouth daily in the afternoon. UTI prevention., Disp: 90 tablet, Rfl: 3   Cholecalciferol (VITAMIN D) 50 MCG (2000 UT) CAPS, Take  2,000 Units by mouth daily. , Disp: , Rfl:    cyanocobalamin (VITAMIN B12) 1000 MCG tablet, Take 1,000 mcg by mouth daily., Disp: , Rfl:    escitalopram (LEXAPRO) 10 MG tablet, Take 1 tablet (10 mg total) by mouth daily. with food, Disp: 90 tablet, Rfl: 3   feeding supplement (ENSURE ENLIVE / ENSURE PLUS) LIQD, Take 237 mLs by mouth 2 (two) times daily between meals., Disp: 237 mL, Rfl: 12   ferrous sulfate 325 (65 FE) MG tablet, Take 325 mg by mouth daily. , Disp: , Rfl:    glycerin adult 2 g suppository, Place 1 suppository rectally as needed for constipation., Disp: , Rfl:    Histamine Dihydrochloride (AUSTRALIAN DREAM ARTHRITIS) 0.025 % CREA, Apply 1 application topically daily as needed (Pain)., Disp: , Rfl:    hydrocortisone (ANUSOL-HC) 25 MG suppository, Place 25 mg rectally 2 (two) times daily., Disp: , Rfl:    levothyroxine (SYNTHROID) 25 MCG tablet, Take 1 tablet (25 mcg total) by mouth daily before breakfast., Disp: 90 tablet, Rfl: 3   lisinopril (ZESTRIL) 5 MG tablet, TAKE 1 TABLET BY MOUTH ONCE DAILY (Patient taking differently: Take 5 mg by mouth daily.), Disp: 90 tablet, Rfl: 0   metFORMIN (GLUCOPHAGE) 500 MG tablet, Take 1 tablet (500 mg total) by mouth 2 (two) times daily with a meal., Disp: 180 tablet, Rfl: 3   omeprazole (PRILOSEC) 20 MG capsule, TAKE 1 CAPSULE BY MOUTH ONCE DAILY (Patient taking differently: Take 20 mg by mouth daily before breakfast.), Disp: 90 capsule, Rfl: 2   polyethylene glycol (MIRALAX /  GLYCOLAX) 17 g packet, Take 17 g by mouth daily as needed for moderate constipation or severe constipation. (Patient taking differently: Take 17 g by mouth daily as needed (constipation).), Disp: 14 each, Rfl: 0   senna (SENOKOT) 8.6 MG TABS tablet, Take 1 tablet (8.6 mg total) by mouth daily., Disp: 120 tablet, Rfl: 0   simvastatin (ZOCOR) 40 MG tablet, TAKE 1 TABLET BY MOUTH AT BEDTIME (Patient taking differently: Take 40 mg by mouth at bedtime.), Disp: 90 tablet, Rfl: 3    sodium chloride 1 g tablet, Take 1 tablet (1 g total) by mouth 2 (two) times daily with a meal., Disp: , Rfl:    witch hazel-glycerin (TUCKS) pad, 1 application  See admin instructions. Apply topically to rectum daily as needed for itching, Disp: , Rfl:    Blood Glucose Monitoring Suppl (ONE TOUCH ULTRA 2) w/Device KIT, Use to check blood sugar as advised up to 2 times daily, Disp: 1 kit, Rfl: 0   glucose blood (ONE TOUCH ULTRA TEST) test strip, CHECK BLOOD SUGAR UP TO 2 TIMES A DAY., Disp: 200 each, Rfl: 11   hydrocortisone (ANUSOL-HC) 2.5 % rectal cream, USE 1 APPLICATION RECTALLY TWICE DAILY AS DIRECTED (Patient not taking: Reported on 07/22/2021), Disp: 30 g, Rfl: 0   OneTouch Delica Lancets 42P MISC, Use to check blood sugar up to 2 times daily, Disp: 200 each, Rfl: 12  ------------------------------------------------------------------------- Social History   Tobacco Use   Smoking status: Never   Smokeless tobacco: Never  Vaping Use   Vaping Use: Never used  Substance Use Topics   Alcohol use: Not Currently   Drug use: No    Review of Systems Per HPI unless specifically indicated above     Objective:    BP (!) 111/50   Pulse 88   Ht _0  (1.499 m)   Wt 102 lb (46.3 kg)   LMP  (LMP Unknown)   SpO2 99%   BMI 20.60 kg/m   Wt Readings from Last 3 Encounters:  09/03/21 102 lb (46.3 kg)  07/25/21 129 lb 3 oz (58.6 kg)  07/13/21 110 lb 0.1 oz (49.9 kg)    Physical Exam Vitals and nursing note reviewed.  Constitutional:      General: She is not in acute distress.    Appearance: Normal appearance. She is well-developed. She is not diaphoretic.     Comments: Well-appearing elderly 79 female, thin, comfortable, cooperative  HENT:     Head: Normocephalic and atraumatic.  Eyes:     General:        Right eye: No discharge.        Left eye: No discharge.     Conjunctiva/sclera: Conjunctivae normal.  Cardiovascular:     Rate and Rhythm: Normal rate.  Pulmonary:     Effort:  Pulmonary effort is normal.  Musculoskeletal:     Comments: Using walker, able to ambulate without assistance.  Skin:    General: Skin is warm and dry.     Findings: No erythema or rash.  Neurological:     Mental Status: She is alert and oriented to person, place, and time.  Psychiatric:        Mood and Affect: Mood normal.        Behavior: Behavior normal.        Thought Content: Thought content normal.     Comments: Well groomed, good eye contact, normal speech and thoughts       Results for orders placed or performed  during the hospital encounter of 07/21/21  CBC  Result Value Ref Range   WBC 11.1 (H) 4.0 - 10.5 K/uL   RBC 2.78 (L) 3.87 - 5.11 MIL/uL   Hemoglobin 8.5 (L) 12.0 - 15.0 g/dL   HCT 26.0 (L) 36.0 - 46.0 %   MCV 93.5 80.0 - 100.0 fL   MCH 30.6 26.0 - 34.0 pg   MCHC 32.7 30.0 - 36.0 g/dL   RDW 13.1 11.5 - 15.5 %   Platelets 519 (H) 150 - 400 K/uL   nRBC 0.0 0.0 - 0.2 %  Basic metabolic panel  Result Value Ref Range   Sodium 127 (L) 135 - 145 mmol/L   Potassium 4.6 3.5 - 5.1 mmol/L   Chloride 92 (L) 98 - 111 mmol/L   CO2 24 22 - 32 mmol/L   Glucose, Bld 159 (H) 70 - 99 mg/dL   BUN 20 8 - 23 mg/dL   Creatinine, Ser 1.02 (H) 0.44 - 1.00 mg/dL   Calcium 9.2 8.9 - 10.3 mg/dL   GFR, Estimated 53 (L) >60 mL/min   Anion gap 11 5 - 15  CBC  Result Value Ref Range   WBC 7.6 4.0 - 10.5 K/uL   RBC 2.38 (L) 3.87 - 5.11 MIL/uL   Hemoglobin 7.5 (L) 12.0 - 15.0 g/dL   HCT 21.9 (L) 36.0 - 46.0 %   MCV 92.0 80.0 - 100.0 fL   MCH 31.5 26.0 - 34.0 pg   MCHC 34.2 30.0 - 36.0 g/dL   RDW 13.2 11.5 - 15.5 %   Platelets 451 (H) 150 - 400 K/uL   nRBC 0.0 0.0 - 0.2 %  Basic metabolic panel  Result Value Ref Range   Sodium 130 (L) 135 - 145 mmol/L   Potassium 4.2 3.5 - 5.1 mmol/L   Chloride 95 (L) 98 - 111 mmol/L   CO2 26 22 - 32 mmol/L   Glucose, Bld 135 (H) 70 - 99 mg/dL   BUN 22 8 - 23 mg/dL   Creatinine, Ser 0.96 0.44 - 1.00 mg/dL   Calcium 8.9 8.9 - 10.3 mg/dL    GFR, Estimated 57 (L) >60 mL/min   Anion gap 9 5 - 15  Glucose, capillary  Result Value Ref Range   Glucose-Capillary 179 (H) 70 - 99 mg/dL  Glucose, capillary  Result Value Ref Range   Glucose-Capillary 124 (H) 70 - 99 mg/dL  Glucose, capillary  Result Value Ref Range   Glucose-Capillary 129 (H) 70 - 99 mg/dL  Glucose, capillary  Result Value Ref Range   Glucose-Capillary 215 (H) 70 - 99 mg/dL  Glucose, capillary  Result Value Ref Range   Glucose-Capillary 189 (H) 70 - 99 mg/dL  CBC  Result Value Ref Range   WBC 7.3 4.0 - 10.5 K/uL   RBC 2.38 (L) 3.87 - 5.11 MIL/uL   Hemoglobin 7.5 (L) 12.0 - 15.0 g/dL   HCT 21.5 (L) 36.0 - 46.0 %   MCV 90.3 80.0 - 100.0 fL   MCH 31.5 26.0 - 34.0 pg   MCHC 34.9 30.0 - 36.0 g/dL   RDW 13.2 11.5 - 15.5 %   Platelets 474 (H) 150 - 400 K/uL   nRBC 0.0 0.0 - 0.2 %  Basic metabolic panel  Result Value Ref Range   Sodium 127 (L) 135 - 145 mmol/L   Potassium 4.2 3.5 - 5.1 mmol/L   Chloride 93 (L) 98 - 111 mmol/L   CO2 25 22 - 32 mmol/L  Glucose, Bld 131 (H) 70 - 99 mg/dL   BUN 25 (H) 8 - 23 mg/dL   Creatinine, Ser 1.10 (H) 0.44 - 1.00 mg/dL   Calcium 8.7 (L) 8.9 - 10.3 mg/dL   GFR, Estimated 48 (L) >60 mL/min   Anion gap 9 5 - 15  Glucose, capillary  Result Value Ref Range   Glucose-Capillary 124 (H) 70 - 99 mg/dL  Glucose, capillary  Result Value Ref Range   Glucose-Capillary 134 (H) 70 - 99 mg/dL  Glucose, capillary  Result Value Ref Range   Glucose-Capillary 130 (H) 70 - 99 mg/dL  Glucose, capillary  Result Value Ref Range   Glucose-Capillary 129 (H) 70 - 99 mg/dL  Glucose, capillary  Result Value Ref Range   Glucose-Capillary 176 (H) 70 - 99 mg/dL  Glucose, capillary  Result Value Ref Range   Glucose-Capillary 148 (H) 70 - 99 mg/dL  Glucose, capillary  Result Value Ref Range   Glucose-Capillary 152 (H) 70 - 99 mg/dL  Glucose, capillary  Result Value Ref Range   Glucose-Capillary 310 (H) 70 - 99 mg/dL  CBC  Result Value  Ref Range   WBC 12.0 (H) 4.0 - 10.5 K/uL   RBC 2.49 (L) 3.87 - 5.11 MIL/uL   Hemoglobin 7.7 (L) 12.0 - 15.0 g/dL   HCT 22.9 (L) 36.0 - 46.0 %   MCV 92.0 80.0 - 100.0 fL   MCH 30.9 26.0 - 34.0 pg   MCHC 33.6 30.0 - 36.0 g/dL   RDW 13.3 11.5 - 15.5 %   Platelets 512 (H) 150 - 400 K/uL   nRBC 0.0 0.0 - 0.2 %  Basic metabolic panel  Result Value Ref Range   Sodium 129 (L) 135 - 145 mmol/L   Potassium 4.4 3.5 - 5.1 mmol/L   Chloride 94 (L) 98 - 111 mmol/L   CO2 26 22 - 32 mmol/L   Glucose, Bld 184 (H) 70 - 99 mg/dL   BUN 23 8 - 23 mg/dL   Creatinine, Ser 1.01 (H) 0.44 - 1.00 mg/dL   Calcium 8.5 (L) 8.9 - 10.3 mg/dL   GFR, Estimated 53 (L) >60 mL/min   Anion gap 9 5 - 15  Glucose, capillary  Result Value Ref Range   Glucose-Capillary 215 (H) 70 - 99 mg/dL  VITAMIN D 25 Hydroxy (Vit-D Deficiency, Fractures)  Result Value Ref Range   Vit D, 25-Hydroxy 79.15 30 - 100 ng/mL  Glucose, capillary  Result Value Ref Range   Glucose-Capillary 203 (H) 70 - 99 mg/dL  Glucose, capillary  Result Value Ref Range   Glucose-Capillary 214 (H) 70 - 99 mg/dL  Glucose, capillary  Result Value Ref Range   Glucose-Capillary 218 (H) 70 - 99 mg/dL  CBC  Result Value Ref Range   WBC 12.4 (H) 4.0 - 10.5 K/uL   RBC 2.24 (L) 3.87 - 5.11 MIL/uL   Hemoglobin 7.0 (L) 12.0 - 15.0 g/dL   HCT 20.6 (L) 36.0 - 46.0 %   MCV 92.0 80.0 - 100.0 fL   MCH 31.3 26.0 - 34.0 pg   MCHC 34.0 30.0 - 36.0 g/dL   RDW 13.4 11.5 - 15.5 %   Platelets 456 (H) 150 - 400 K/uL   nRBC 0.0 0.0 - 0.2 %  Basic metabolic panel  Result Value Ref Range   Sodium 129 (L) 135 - 145 mmol/L   Potassium 4.6 3.5 - 5.1 mmol/L   Chloride 94 (L) 98 - 111 mmol/L   CO2 27 22 -  32 mmol/L   Glucose, Bld 182 (H) 70 - 99 mg/dL   BUN 23 8 - 23 mg/dL   Creatinine, Ser 1.07 (H) 0.44 - 1.00 mg/dL   Calcium 8.5 (L) 8.9 - 10.3 mg/dL   GFR, Estimated 50 (L) >60 mL/min   Anion gap 8 5 - 15  Glucose, capillary  Result Value Ref Range    Glucose-Capillary 203 (H) 70 - 99 mg/dL  Glucose, capillary  Result Value Ref Range   Glucose-Capillary 173 (H) 70 - 99 mg/dL  Glucose, capillary  Result Value Ref Range   Glucose-Capillary 263 (H) 70 - 99 mg/dL  Glucose, capillary  Result Value Ref Range   Glucose-Capillary 209 (H) 70 - 99 mg/dL  Glucose, capillary  Result Value Ref Range   Glucose-Capillary 184 (H) 70 - 99 mg/dL  CBC  Result Value Ref Range   WBC 11.1 (H) 4.0 - 10.5 K/uL   RBC 2.08 (L) 3.87 - 5.11 MIL/uL   Hemoglobin 6.5 (LL) 12.0 - 15.0 g/dL   HCT 19.0 (L) 36.0 - 46.0 %   MCV 91.3 80.0 - 100.0 fL   MCH 31.3 26.0 - 34.0 pg   MCHC 34.2 30.0 - 36.0 g/dL   RDW 13.2 11.5 - 15.5 %   Platelets 477 (H) 150 - 400 K/uL   nRBC 0.0 0.0 - 0.2 %  Basic metabolic panel  Result Value Ref Range   Sodium 127 (L) 135 - 145 mmol/L   Potassium 4.6 3.5 - 5.1 mmol/L   Chloride 94 (L) 98 - 111 mmol/L   CO2 26 22 - 32 mmol/L   Glucose, Bld 152 (H) 70 - 99 mg/dL   BUN 20 8 - 23 mg/dL   Creatinine, Ser 1.09 (H) 0.44 - 1.00 mg/dL   Calcium 8.3 (L) 8.9 - 10.3 mg/dL   GFR, Estimated 49 (L) >60 mL/min   Anion gap 7 5 - 15  Glucose, capillary  Result Value Ref Range   Glucose-Capillary 240 (H) 70 - 99 mg/dL  Glucose, capillary  Result Value Ref Range   Glucose-Capillary 176 (H) 70 - 99 mg/dL  Glucose, capillary  Result Value Ref Range   Glucose-Capillary 168 (H) 70 - 99 mg/dL  Glucose, capillary  Result Value Ref Range   Glucose-Capillary 174 (H) 70 - 99 mg/dL  Hemoglobin and hematocrit, blood  Result Value Ref Range   Hemoglobin 8.7 (L) 12.0 - 15.0 g/dL   HCT 26.3 (L) 36.0 - 46.0 %  Glucose, capillary  Result Value Ref Range   Glucose-Capillary 177 (H) 70 - 99 mg/dL  Glucose, capillary  Result Value Ref Range   Glucose-Capillary 266 (H) 70 - 99 mg/dL  Glucose, capillary  Result Value Ref Range   Glucose-Capillary 114 (H) 70 - 99 mg/dL  Glucose, capillary  Result Value Ref Range   Glucose-Capillary 166 (H) 70 - 99  mg/dL  Glucose, capillary  Result Value Ref Range   Glucose-Capillary 204 (H) 70 - 99 mg/dL  CBC  Result Value Ref Range   WBC 8.9 4.0 - 10.5 K/uL   RBC 3.00 (L) 3.87 - 5.11 MIL/uL   Hemoglobin 9.4 (L) 12.0 - 15.0 g/dL   HCT 27.2 (L) 36.0 - 46.0 %   MCV 90.7 80.0 - 100.0 fL   MCH 31.3 26.0 - 34.0 pg   MCHC 34.6 30.0 - 36.0 g/dL   RDW 13.4 11.5 - 15.5 %   Platelets 616 (H) 150 - 400 K/uL   nRBC 0.0 0.0 -  0.2 %  Glucose, capillary  Result Value Ref Range   Glucose-Capillary 268 (H) 70 - 99 mg/dL  Glucose, capillary  Result Value Ref Range   Glucose-Capillary 160 (H) 70 - 99 mg/dL  Glucose, capillary  Result Value Ref Range   Glucose-Capillary 155 (H) 70 - 99 mg/dL   Comment 1 Document in Chart   Glucose, capillary  Result Value Ref Range   Glucose-Capillary 175 (H) 70 - 99 mg/dL   Comment 1 Document in Chart   Type and screen [place]  Result Value Ref Range   ABO/RH(D) AB POS    Antibody Screen NEG    Sample Expiration 07/25/2021,2359    Unit Number [id number]    Blood Component Type RED CELLS,LR    Unit division 00    Status of Unit REL FROM [facility]    Transfusion Status OK TO TRANSFUSE    Crossmatch Result Compatible    Unit Number [id number]    Blood Component Type RBC LR PHER1    Unit division 00    Status of Unit REL FROM [facility]    Transfusion Status OK TO TRANSFUSE    Crossmatch Result      Compatible Performed at [facility], [address] [place], [place] [address]   Prepare RBC (crossmatch)  Result Value Ref Range   Order Confirmation      ORDER PROCESSED BY BLOOD BANK Performed at [facility], [place] [address], [place], [place] [address]   Type and screen [place]  Result Value Ref Range   ABO/RH(D) AB POS    Antibody Screen NEG    Sample Expiration 07/29/2021,2359    Unit Number [id number]    Blood Component Type RED CELLS,LR    Unit division 00    Status of Unit ISSUED,FINAL     Transfusion Status OK TO TRANSFUSE    Crossmatch Result      Compatible Performed at [facility], [place] [address] [place], [place] [address]   Prepare RBC (crossmatch)  Result Value Ref Range   Order Confirmation      ORDER PROCESSED BY BLOOD BANK Performed at [facility], [place] [address], [place], [place] [address]   BPAM [facility]  Result Value Ref Range   ISSUE DATE / TIME 833825053976    Blood Product Unit Number [id number]    PRODUCT CODE B3532D92    Unit Type and Rh 9500    Blood Product Expiration Date 426834196222    Blood Product Unit Number [id number]    PRODUCT CODE Y8144Y18    Unit Type and Rh 9500    Blood Product Expiration Date 563149702637   BPAM RBC  Result Value Ref Range   ISSUE DATE / TIME 858850277412    Blood Product Unit Number [id number]    PRODUCT CODE J6283M62    Unit Type and Rh 6200    Blood Product Expiration Date 947654650354       Assessment & Plan:   Problem List Items Addressed This Visit     Constipation   Osteoarthritis of multiple joints   Type 2 diabetes with stage 3 chronic kidney disease GFR 30-59 (HCC)   Relevant Medications   Blood Glucose Monitoring Suppl (ONE TOUCH ULTRA 2) w/Device KIT   glucose blood (ONE TOUCH ULTRA TEST) test strip   OneTouch Delica Lancets 65K MISC   Other Visit Diagnoses     S/p left hip fracture    -  Primary   Weight loss          Post-op / Rehab HFU L Hip s/p repeat surgical repair has improved. She is ambulating with walker without assistance Continues PT Follow ortho  Med rec completed by [facility] recently.  Chronic constipation May keep Miralax for constipation. If not successful we can try ordering the other medication, Linzess - let me know it is brand name, can be costly.  If you have upset stomach symptoms, you can try - OTC Peppermint Oil (Triple Coated Capsule) 146m take one 3 times daily to reduce diarrhea  One brand name is "IBgard" for the peppermint  oil.  You may check blood sugar only when you need it. If you are not feeling well or if low sugar symptoms.  New OneTouch Ultra meter ordered to [facility].  Weight is stable with improved PO intake. Keep on Ensure nutrition supplement.    Meds ordered this encounter  Medications   Blood Glucose Monitoring Suppl (ONE TOUCH ULTRA 2) w/Device KIT    Sig: Use to check blood sugar as advised up to 2 times daily    Dispense:  1 kit    Refill:  0   glucose blood (ONE TOUCH ULTRA TEST) test strip    Sig: CHECK BLOOD SUGAR UP TO 2 TIMES A DAY.    Dispense:  200 each    Refill:  11   OneTouch Delica Lancets 308LMISC    Sig: Use to check blood sugar up to 2 times daily    Dispense:  200 each    Refill:  12    Onetouch ultra    Follow up plan: Return if symptoms worsen or fail to improve.  ANobie Putnam [facility] 09/03/2021, 3:41 PM

## 2021-09-03 NOTE — Patient Instructions (Addendum)
Thank you for coming to the office today.  May keep Miralax for constipation. If not successful we can try ordering the other medication, Linzess - let me know it is brand name, can be costly.  If you have upset stomach symptoms, you can try - OTC Peppermint Oil (Triple Coated Capsule) '180mg'$  take one 3 times daily to reduce diarrhea  One brand name is "IBgard" for the peppermint oil.  You may check blood sugar only when you need it. If you are not feeling well or if low sugar symptoms.  New OneTouch Ultra meter ordered to [facility].   Please schedule a Follow-up Appointment to: Return in about 3 months (around 12/04/2021) for 3 month DM A1c, Hip follow-up.  If you have any other questions or concerns, please feel free to call the office or send a message through [place]. You may also schedule an earlier appointment if necessary.  Additionally, you may be receiving a survey about your experience at our office within a few days to 1 week by e-mail or mail. We value your feedback.  Nobie Putnam, DO [place]

## 2021-09-04 DIAGNOSIS — Z87442 Personal history of urinary calculi: Secondary | ICD-10-CM | POA: Diagnosis not present

## 2021-09-04 DIAGNOSIS — D62 Acute posthemorrhagic anemia: Secondary | ICD-10-CM | POA: Diagnosis not present

## 2021-09-04 DIAGNOSIS — E1122 Type 2 diabetes mellitus with diabetic chronic kidney disease: Secondary | ICD-10-CM | POA: Diagnosis not present

## 2021-09-04 DIAGNOSIS — N390 Urinary tract infection, site not specified: Secondary | ICD-10-CM | POA: Diagnosis not present

## 2021-09-04 DIAGNOSIS — F32A Depression, unspecified: Secondary | ICD-10-CM | POA: Diagnosis not present

## 2021-09-04 DIAGNOSIS — E039 Hypothyroidism, unspecified: Secondary | ICD-10-CM | POA: Diagnosis not present

## 2021-09-04 DIAGNOSIS — E871 Hypo-osmolality and hyponatremia: Secondary | ICD-10-CM | POA: Diagnosis not present

## 2021-09-04 DIAGNOSIS — E782 Mixed hyperlipidemia: Secondary | ICD-10-CM | POA: Diagnosis not present

## 2021-09-04 DIAGNOSIS — K5904 Chronic idiopathic constipation: Secondary | ICD-10-CM | POA: Diagnosis not present

## 2021-09-04 DIAGNOSIS — I129 Hypertensive chronic kidney disease with stage 1 through stage 4 chronic kidney disease, or unspecified chronic kidney disease: Secondary | ICD-10-CM | POA: Diagnosis not present

## 2021-09-04 DIAGNOSIS — N1832 Chronic kidney disease, stage 3b: Secondary | ICD-10-CM | POA: Diagnosis not present

## 2021-09-04 DIAGNOSIS — S72402D Unspecified fracture of lower end of left femur, subsequent encounter for closed fracture with routine healing: Secondary | ICD-10-CM | POA: Diagnosis not present

## 2021-09-04 DIAGNOSIS — M8580 Other specified disorders of bone density and structure, unspecified site: Secondary | ICD-10-CM | POA: Diagnosis not present

## 2021-09-04 DIAGNOSIS — H538 Other visual disturbances: Secondary | ICD-10-CM | POA: Diagnosis not present

## 2021-09-04 DIAGNOSIS — E46 Unspecified protein-calorie malnutrition: Secondary | ICD-10-CM | POA: Diagnosis not present

## 2021-09-04 DIAGNOSIS — G2581 Restless legs syndrome: Secondary | ICD-10-CM | POA: Diagnosis not present

## 2021-09-04 DIAGNOSIS — Z9181 History of falling: Secondary | ICD-10-CM | POA: Diagnosis not present

## 2021-09-04 DIAGNOSIS — Z4789 Encounter for other orthopedic aftercare: Secondary | ICD-10-CM | POA: Diagnosis not present

## 2021-09-04 DIAGNOSIS — K219 Gastro-esophageal reflux disease without esophagitis: Secondary | ICD-10-CM | POA: Diagnosis not present

## 2021-09-04 DIAGNOSIS — Z7984 Long term (current) use of oral hypoglycemic drugs: Secondary | ICD-10-CM | POA: Diagnosis not present

## 2021-09-08 DIAGNOSIS — K5904 Chronic idiopathic constipation: Secondary | ICD-10-CM | POA: Diagnosis not present

## 2021-09-08 DIAGNOSIS — M8580 Other specified disorders of bone density and structure, unspecified site: Secondary | ICD-10-CM | POA: Diagnosis not present

## 2021-09-08 DIAGNOSIS — G2581 Restless legs syndrome: Secondary | ICD-10-CM | POA: Diagnosis not present

## 2021-09-08 DIAGNOSIS — Z87442 Personal history of urinary calculi: Secondary | ICD-10-CM | POA: Diagnosis not present

## 2021-09-08 DIAGNOSIS — E782 Mixed hyperlipidemia: Secondary | ICD-10-CM | POA: Diagnosis not present

## 2021-09-08 DIAGNOSIS — H538 Other visual disturbances: Secondary | ICD-10-CM | POA: Diagnosis not present

## 2021-09-08 DIAGNOSIS — Z7984 Long term (current) use of oral hypoglycemic drugs: Secondary | ICD-10-CM | POA: Diagnosis not present

## 2021-09-08 DIAGNOSIS — N390 Urinary tract infection, site not specified: Secondary | ICD-10-CM | POA: Diagnosis not present

## 2021-09-08 DIAGNOSIS — K219 Gastro-esophageal reflux disease without esophagitis: Secondary | ICD-10-CM | POA: Diagnosis not present

## 2021-09-08 DIAGNOSIS — S72402D Unspecified fracture of lower end of left femur, subsequent encounter for closed fracture with routine healing: Secondary | ICD-10-CM | POA: Diagnosis not present

## 2021-09-08 DIAGNOSIS — F32A Depression, unspecified: Secondary | ICD-10-CM | POA: Diagnosis not present

## 2021-09-08 DIAGNOSIS — D62 Acute posthemorrhagic anemia: Secondary | ICD-10-CM | POA: Diagnosis not present

## 2021-09-08 DIAGNOSIS — E1122 Type 2 diabetes mellitus with diabetic chronic kidney disease: Secondary | ICD-10-CM | POA: Diagnosis not present

## 2021-09-08 DIAGNOSIS — N1832 Chronic kidney disease, stage 3b: Secondary | ICD-10-CM | POA: Diagnosis not present

## 2021-09-08 DIAGNOSIS — E46 Unspecified protein-calorie malnutrition: Secondary | ICD-10-CM | POA: Diagnosis not present

## 2021-09-08 DIAGNOSIS — Z4789 Encounter for other orthopedic aftercare: Secondary | ICD-10-CM | POA: Diagnosis not present

## 2021-09-08 DIAGNOSIS — E039 Hypothyroidism, unspecified: Secondary | ICD-10-CM | POA: Diagnosis not present

## 2021-09-08 DIAGNOSIS — E871 Hypo-osmolality and hyponatremia: Secondary | ICD-10-CM | POA: Diagnosis not present

## 2021-09-08 DIAGNOSIS — I129 Hypertensive chronic kidney disease with stage 1 through stage 4 chronic kidney disease, or unspecified chronic kidney disease: Secondary | ICD-10-CM | POA: Diagnosis not present

## 2021-09-08 DIAGNOSIS — Z9181 History of falling: Secondary | ICD-10-CM | POA: Diagnosis not present

## 2021-09-09 ENCOUNTER — Other Ambulatory Visit (INDEPENDENT_AMBULATORY_CARE_PROVIDER_SITE_OTHER): Payer: PPO | Admitting: Family Medicine

## 2021-09-09 DIAGNOSIS — N1831 Chronic kidney disease, stage 3a: Secondary | ICD-10-CM

## 2021-09-09 DIAGNOSIS — S72402D Unspecified fracture of lower end of left femur, subsequent encounter for closed fracture with routine healing: Secondary | ICD-10-CM

## 2021-09-09 DIAGNOSIS — E440 Moderate protein-calorie malnutrition: Secondary | ICD-10-CM

## 2021-09-09 DIAGNOSIS — Z8781 Personal history of (healed) traumatic fracture: Secondary | ICD-10-CM

## 2021-09-09 DIAGNOSIS — E871 Hypo-osmolality and hyponatremia: Secondary | ICD-10-CM

## 2021-09-09 DIAGNOSIS — S7292XD Unspecified fracture of left femur, subsequent encounter for closed fracture with routine healing: Secondary | ICD-10-CM | POA: Diagnosis not present

## 2021-09-09 DIAGNOSIS — F5104 Psychophysiologic insomnia: Secondary | ICD-10-CM

## 2021-09-09 DIAGNOSIS — I129 Hypertensive chronic kidney disease with stage 1 through stage 4 chronic kidney disease, or unspecified chronic kidney disease: Secondary | ICD-10-CM

## 2021-09-09 NOTE — Progress Notes (Signed)
Received home health orders orders from [facility]. Start of care 08/26/21.   Certification and orders from 08/26/21 through 10/24/21 are reviewed, signed and faxed back to home health company.  Need of intermittent skilled services at home: Yes  The home health care plan has been established by me and will be reviewed and updated as needed to maximize patient recovery.  I certify that all home health services have been and will be furnished to the patient while under my care.  Face-to-face encounter in which the need for home health services was established: 09/03/21  Patient is receiving home health services for the following diagnoses: Problem List Items Addressed This Visit   None    Nobie Putnam, DO   Nobie Putnam, [facility] 09/09/2021, 9:30 AM

## 2021-09-16 DIAGNOSIS — D62 Acute posthemorrhagic anemia: Secondary | ICD-10-CM | POA: Diagnosis not present

## 2021-09-16 DIAGNOSIS — Z7984 Long term (current) use of oral hypoglycemic drugs: Secondary | ICD-10-CM | POA: Diagnosis not present

## 2021-09-16 DIAGNOSIS — Z87442 Personal history of urinary calculi: Secondary | ICD-10-CM | POA: Diagnosis not present

## 2021-09-16 DIAGNOSIS — Z9181 History of falling: Secondary | ICD-10-CM | POA: Diagnosis not present

## 2021-09-16 DIAGNOSIS — N1832 Chronic kidney disease, stage 3b: Secondary | ICD-10-CM | POA: Diagnosis not present

## 2021-09-16 DIAGNOSIS — M8580 Other specified disorders of bone density and structure, unspecified site: Secondary | ICD-10-CM | POA: Diagnosis not present

## 2021-09-16 DIAGNOSIS — G2581 Restless legs syndrome: Secondary | ICD-10-CM | POA: Diagnosis not present

## 2021-09-16 DIAGNOSIS — I129 Hypertensive chronic kidney disease with stage 1 through stage 4 chronic kidney disease, or unspecified chronic kidney disease: Secondary | ICD-10-CM | POA: Diagnosis not present

## 2021-09-16 DIAGNOSIS — N390 Urinary tract infection, site not specified: Secondary | ICD-10-CM | POA: Diagnosis not present

## 2021-09-16 DIAGNOSIS — E871 Hypo-osmolality and hyponatremia: Secondary | ICD-10-CM | POA: Diagnosis not present

## 2021-09-16 DIAGNOSIS — K5904 Chronic idiopathic constipation: Secondary | ICD-10-CM | POA: Diagnosis not present

## 2021-09-16 DIAGNOSIS — E782 Mixed hyperlipidemia: Secondary | ICD-10-CM | POA: Diagnosis not present

## 2021-09-16 DIAGNOSIS — F32A Depression, unspecified: Secondary | ICD-10-CM | POA: Diagnosis not present

## 2021-09-16 DIAGNOSIS — K219 Gastro-esophageal reflux disease without esophagitis: Secondary | ICD-10-CM | POA: Diagnosis not present

## 2021-09-16 DIAGNOSIS — E039 Hypothyroidism, unspecified: Secondary | ICD-10-CM | POA: Diagnosis not present

## 2021-09-16 DIAGNOSIS — E46 Unspecified protein-calorie malnutrition: Secondary | ICD-10-CM | POA: Diagnosis not present

## 2021-09-16 DIAGNOSIS — S72402D Unspecified fracture of lower end of left femur, subsequent encounter for closed fracture with routine healing: Secondary | ICD-10-CM | POA: Diagnosis not present

## 2021-09-16 DIAGNOSIS — E1122 Type 2 diabetes mellitus with diabetic chronic kidney disease: Secondary | ICD-10-CM | POA: Diagnosis not present

## 2021-09-16 DIAGNOSIS — Z4789 Encounter for other orthopedic aftercare: Secondary | ICD-10-CM | POA: Diagnosis not present

## 2021-09-16 DIAGNOSIS — H538 Other visual disturbances: Secondary | ICD-10-CM | POA: Diagnosis not present

## 2021-09-18 DIAGNOSIS — I10 Essential (primary) hypertension: Secondary | ICD-10-CM

## 2021-09-23 DIAGNOSIS — E782 Mixed hyperlipidemia: Secondary | ICD-10-CM | POA: Diagnosis not present

## 2021-09-23 DIAGNOSIS — N1832 Chronic kidney disease, stage 3b: Secondary | ICD-10-CM | POA: Diagnosis not present

## 2021-09-23 DIAGNOSIS — Z4789 Encounter for other orthopedic aftercare: Secondary | ICD-10-CM | POA: Diagnosis not present

## 2021-09-23 DIAGNOSIS — E871 Hypo-osmolality and hyponatremia: Secondary | ICD-10-CM | POA: Diagnosis not present

## 2021-09-23 DIAGNOSIS — F32A Depression, unspecified: Secondary | ICD-10-CM | POA: Diagnosis not present

## 2021-09-23 DIAGNOSIS — N390 Urinary tract infection, site not specified: Secondary | ICD-10-CM | POA: Diagnosis not present

## 2021-09-23 DIAGNOSIS — Z9181 History of falling: Secondary | ICD-10-CM | POA: Diagnosis not present

## 2021-09-23 DIAGNOSIS — S72402D Unspecified fracture of lower end of left femur, subsequent encounter for closed fracture with routine healing: Secondary | ICD-10-CM | POA: Diagnosis not present

## 2021-09-23 DIAGNOSIS — E039 Hypothyroidism, unspecified: Secondary | ICD-10-CM | POA: Diagnosis not present

## 2021-09-23 DIAGNOSIS — K219 Gastro-esophageal reflux disease without esophagitis: Secondary | ICD-10-CM | POA: Diagnosis not present

## 2021-09-23 DIAGNOSIS — I129 Hypertensive chronic kidney disease with stage 1 through stage 4 chronic kidney disease, or unspecified chronic kidney disease: Secondary | ICD-10-CM | POA: Diagnosis not present

## 2021-09-23 DIAGNOSIS — E46 Unspecified protein-calorie malnutrition: Secondary | ICD-10-CM | POA: Diagnosis not present

## 2021-09-23 DIAGNOSIS — H538 Other visual disturbances: Secondary | ICD-10-CM | POA: Diagnosis not present

## 2021-09-23 DIAGNOSIS — Z87442 Personal history of urinary calculi: Secondary | ICD-10-CM | POA: Diagnosis not present

## 2021-09-23 DIAGNOSIS — E1122 Type 2 diabetes mellitus with diabetic chronic kidney disease: Secondary | ICD-10-CM | POA: Diagnosis not present

## 2021-09-23 DIAGNOSIS — Z7984 Long term (current) use of oral hypoglycemic drugs: Secondary | ICD-10-CM | POA: Diagnosis not present

## 2021-09-23 DIAGNOSIS — D62 Acute posthemorrhagic anemia: Secondary | ICD-10-CM | POA: Diagnosis not present

## 2021-09-23 DIAGNOSIS — M8580 Other specified disorders of bone density and structure, unspecified site: Secondary | ICD-10-CM | POA: Diagnosis not present

## 2021-09-23 DIAGNOSIS — K5904 Chronic idiopathic constipation: Secondary | ICD-10-CM | POA: Diagnosis not present

## 2021-09-23 DIAGNOSIS — G2581 Restless legs syndrome: Secondary | ICD-10-CM | POA: Diagnosis not present

## 2021-09-24 ENCOUNTER — Other Ambulatory Visit: Payer: Self-pay | Admitting: Family Medicine

## 2021-09-24 DIAGNOSIS — E038 Other specified hypothyroidism: Secondary | ICD-10-CM

## 2021-09-25 DIAGNOSIS — E871 Hypo-osmolality and hyponatremia: Secondary | ICD-10-CM | POA: Diagnosis not present

## 2021-09-25 DIAGNOSIS — Z4789 Encounter for other orthopedic aftercare: Secondary | ICD-10-CM | POA: Diagnosis not present

## 2021-09-25 DIAGNOSIS — Z9181 History of falling: Secondary | ICD-10-CM | POA: Diagnosis not present

## 2021-09-25 DIAGNOSIS — N390 Urinary tract infection, site not specified: Secondary | ICD-10-CM | POA: Diagnosis not present

## 2021-09-25 DIAGNOSIS — N1832 Chronic kidney disease, stage 3b: Secondary | ICD-10-CM | POA: Diagnosis not present

## 2021-09-25 DIAGNOSIS — D62 Acute posthemorrhagic anemia: Secondary | ICD-10-CM | POA: Diagnosis not present

## 2021-09-25 DIAGNOSIS — E46 Unspecified protein-calorie malnutrition: Secondary | ICD-10-CM | POA: Diagnosis not present

## 2021-09-25 DIAGNOSIS — K5904 Chronic idiopathic constipation: Secondary | ICD-10-CM | POA: Diagnosis not present

## 2021-09-25 DIAGNOSIS — Z87442 Personal history of urinary calculi: Secondary | ICD-10-CM | POA: Diagnosis not present

## 2021-09-25 DIAGNOSIS — E1122 Type 2 diabetes mellitus with diabetic chronic kidney disease: Secondary | ICD-10-CM | POA: Diagnosis not present

## 2021-09-25 DIAGNOSIS — K219 Gastro-esophageal reflux disease without esophagitis: Secondary | ICD-10-CM | POA: Diagnosis not present

## 2021-09-25 DIAGNOSIS — F32A Depression, unspecified: Secondary | ICD-10-CM | POA: Diagnosis not present

## 2021-09-25 DIAGNOSIS — M8580 Other specified disorders of bone density and structure, unspecified site: Secondary | ICD-10-CM | POA: Diagnosis not present

## 2021-09-25 DIAGNOSIS — H538 Other visual disturbances: Secondary | ICD-10-CM | POA: Diagnosis not present

## 2021-09-25 DIAGNOSIS — S72402D Unspecified fracture of lower end of left femur, subsequent encounter for closed fracture with routine healing: Secondary | ICD-10-CM | POA: Diagnosis not present

## 2021-09-25 DIAGNOSIS — I129 Hypertensive chronic kidney disease with stage 1 through stage 4 chronic kidney disease, or unspecified chronic kidney disease: Secondary | ICD-10-CM | POA: Diagnosis not present

## 2021-09-25 DIAGNOSIS — E782 Mixed hyperlipidemia: Secondary | ICD-10-CM | POA: Diagnosis not present

## 2021-09-25 DIAGNOSIS — G2581 Restless legs syndrome: Secondary | ICD-10-CM | POA: Diagnosis not present

## 2021-09-25 DIAGNOSIS — E039 Hypothyroidism, unspecified: Secondary | ICD-10-CM | POA: Diagnosis not present

## 2021-09-25 DIAGNOSIS — Z7984 Long term (current) use of oral hypoglycemic drugs: Secondary | ICD-10-CM | POA: Diagnosis not present

## 2021-09-25 NOTE — Telephone Encounter (Signed)
Requested Prescriptions  Pending Prescriptions Disp Refills  . levothyroxine (SYNTHROID) 25 MCG tablet [Pharmacy Med Name: LEVOTHYROXINE SODIUM 25 MCG TAB] 90 tablet 1    Sig: TAKE 1 TABLET BY MOUTH ONCE DAILY ON AN EMPTY STOMACH. WAIT 30 MINUTES BEFORE TAKING OTHER MEDS.     Endocrinology:  Hypothyroid Agents Passed - 09/24/2021  9:25 AM      Passed - TSH in normal range and within 360 days    TSH  Date Value Ref Range Status  03/14/2021 4.46 0.40 - 4.50 mIU/L Final         Passed - Valid encounter within last 12 months    Recent Outpatient Visits          3 weeks ago S/p left hip fracture   [place], DO   6 months ago Type 2 diabetes mellitus with stage 3a chronic kidney disease, without long-term current use of insulin ([place])   [facility] Olin Hauser, DO   10 months ago Vitamin B12 deficiency   [place], DO   12 months ago Vitamin B12 deficiency   [place], DO   1 year ago Vitamin B12 deficiency   [place], DO      Future Appointments            In 2 months MacDiarmid, Nicki Reaper, MD [place]   In 2 months Parks Ranger, Devonne Doughty, [facility], [facility]

## 2021-09-26 DIAGNOSIS — M9702XD Periprosthetic fracture around internal prosthetic left hip joint, subsequent encounter: Secondary | ICD-10-CM | POA: Diagnosis not present

## 2021-10-02 DIAGNOSIS — H538 Other visual disturbances: Secondary | ICD-10-CM | POA: Diagnosis not present

## 2021-10-02 DIAGNOSIS — Z4789 Encounter for other orthopedic aftercare: Secondary | ICD-10-CM | POA: Diagnosis not present

## 2021-10-02 DIAGNOSIS — K219 Gastro-esophageal reflux disease without esophagitis: Secondary | ICD-10-CM | POA: Diagnosis not present

## 2021-10-02 DIAGNOSIS — Z7984 Long term (current) use of oral hypoglycemic drugs: Secondary | ICD-10-CM | POA: Diagnosis not present

## 2021-10-02 DIAGNOSIS — N1832 Chronic kidney disease, stage 3b: Secondary | ICD-10-CM | POA: Diagnosis not present

## 2021-10-02 DIAGNOSIS — Z87442 Personal history of urinary calculi: Secondary | ICD-10-CM | POA: Diagnosis not present

## 2021-10-02 DIAGNOSIS — S72402D Unspecified fracture of lower end of left femur, subsequent encounter for closed fracture with routine healing: Secondary | ICD-10-CM | POA: Diagnosis not present

## 2021-10-02 DIAGNOSIS — N390 Urinary tract infection, site not specified: Secondary | ICD-10-CM | POA: Diagnosis not present

## 2021-10-02 DIAGNOSIS — I129 Hypertensive chronic kidney disease with stage 1 through stage 4 chronic kidney disease, or unspecified chronic kidney disease: Secondary | ICD-10-CM | POA: Diagnosis not present

## 2021-10-02 DIAGNOSIS — K5904 Chronic idiopathic constipation: Secondary | ICD-10-CM | POA: Diagnosis not present

## 2021-10-02 DIAGNOSIS — D62 Acute posthemorrhagic anemia: Secondary | ICD-10-CM | POA: Diagnosis not present

## 2021-10-02 DIAGNOSIS — E782 Mixed hyperlipidemia: Secondary | ICD-10-CM | POA: Diagnosis not present

## 2021-10-02 DIAGNOSIS — Z9181 History of falling: Secondary | ICD-10-CM | POA: Diagnosis not present

## 2021-10-02 DIAGNOSIS — E871 Hypo-osmolality and hyponatremia: Secondary | ICD-10-CM | POA: Diagnosis not present

## 2021-10-02 DIAGNOSIS — E1122 Type 2 diabetes mellitus with diabetic chronic kidney disease: Secondary | ICD-10-CM | POA: Diagnosis not present

## 2021-10-02 DIAGNOSIS — F32A Depression, unspecified: Secondary | ICD-10-CM | POA: Diagnosis not present

## 2021-10-02 DIAGNOSIS — M8580 Other specified disorders of bone density and structure, unspecified site: Secondary | ICD-10-CM | POA: Diagnosis not present

## 2021-10-02 DIAGNOSIS — G2581 Restless legs syndrome: Secondary | ICD-10-CM | POA: Diagnosis not present

## 2021-10-02 DIAGNOSIS — E46 Unspecified protein-calorie malnutrition: Secondary | ICD-10-CM | POA: Diagnosis not present

## 2021-10-02 DIAGNOSIS — E039 Hypothyroidism, unspecified: Secondary | ICD-10-CM | POA: Diagnosis not present

## 2021-10-06 ENCOUNTER — Other Ambulatory Visit: Payer: Self-pay | Admitting: Family Medicine

## 2021-10-06 DIAGNOSIS — E1122 Type 2 diabetes mellitus with diabetic chronic kidney disease: Secondary | ICD-10-CM

## 2021-10-07 NOTE — Telephone Encounter (Signed)
Requested Prescriptions  Pending Prescriptions Disp Refills  . lisinopril (ZESTRIL) 5 MG tablet [Pharmacy Med Name: LISINOPRIL 5 MG TAB] 90 tablet 0    Sig: Take 1 tablet (5 mg total) by mouth daily.     Cardiovascular:  ACE Inhibitors Failed - 10/06/2021  9:56 AM      Failed - Cr in normal range and within 180 days    Creat  Date Value Ref Range Status  03/14/2021 1.02 (H) 0.60 - 0.95 mg/dL Final   Creatinine, Ser  Date Value Ref Range Status  07/26/2021 1.09 (H) 0.44 - 1.00 mg/dL Final         Passed - K in normal range and within 180 days    Potassium  Date Value Ref Range Status  07/26/2021 4.6 3.5 - 5.1 mmol/L Final  06/28/2012 4.2 3.5 - 5.1 mmol/L Final         Passed - Patient is not pregnant      Passed - Last BP in normal range    BP Readings from Last 1 Encounters:  09/03/21 (!) 111/50         Passed - Valid encounter within last 6 months    Recent Outpatient Visits          1 month ago S/p left hip fracture   [place], DO   6 months ago Type 2 diabetes mellitus with stage 3a chronic kidney disease, without long-term current use of insulin ([place])   [place], DO   11 months ago Vitamin B12 deficiency   [place], DO   1 year ago Vitamin B12 deficiency   [place], DO   1 year ago Vitamin B12 deficiency   [place], Devonne Doughty, DO      Future Appointments            In 1 month MacDiarmid, Nicki Reaper, MD [place]   In 1 month Parks Ranger, Devonne Doughty, DO [facility], [facility]

## 2021-10-08 DIAGNOSIS — E782 Mixed hyperlipidemia: Secondary | ICD-10-CM | POA: Diagnosis not present

## 2021-10-08 DIAGNOSIS — N1832 Chronic kidney disease, stage 3b: Secondary | ICD-10-CM | POA: Diagnosis not present

## 2021-10-08 DIAGNOSIS — Z9181 History of falling: Secondary | ICD-10-CM | POA: Diagnosis not present

## 2021-10-08 DIAGNOSIS — Z87442 Personal history of urinary calculi: Secondary | ICD-10-CM | POA: Diagnosis not present

## 2021-10-08 DIAGNOSIS — D62 Acute posthemorrhagic anemia: Secondary | ICD-10-CM | POA: Diagnosis not present

## 2021-10-08 DIAGNOSIS — K219 Gastro-esophageal reflux disease without esophagitis: Secondary | ICD-10-CM | POA: Diagnosis not present

## 2021-10-08 DIAGNOSIS — M8580 Other specified disorders of bone density and structure, unspecified site: Secondary | ICD-10-CM | POA: Diagnosis not present

## 2021-10-08 DIAGNOSIS — G2581 Restless legs syndrome: Secondary | ICD-10-CM | POA: Diagnosis not present

## 2021-10-08 DIAGNOSIS — H538 Other visual disturbances: Secondary | ICD-10-CM | POA: Diagnosis not present

## 2021-10-08 DIAGNOSIS — E1122 Type 2 diabetes mellitus with diabetic chronic kidney disease: Secondary | ICD-10-CM | POA: Diagnosis not present

## 2021-10-08 DIAGNOSIS — E039 Hypothyroidism, unspecified: Secondary | ICD-10-CM | POA: Diagnosis not present

## 2021-10-08 DIAGNOSIS — Z4789 Encounter for other orthopedic aftercare: Secondary | ICD-10-CM | POA: Diagnosis not present

## 2021-10-08 DIAGNOSIS — I129 Hypertensive chronic kidney disease with stage 1 through stage 4 chronic kidney disease, or unspecified chronic kidney disease: Secondary | ICD-10-CM | POA: Diagnosis not present

## 2021-10-08 DIAGNOSIS — S72402D Unspecified fracture of lower end of left femur, subsequent encounter for closed fracture with routine healing: Secondary | ICD-10-CM | POA: Diagnosis not present

## 2021-10-08 DIAGNOSIS — K5904 Chronic idiopathic constipation: Secondary | ICD-10-CM | POA: Diagnosis not present

## 2021-10-08 DIAGNOSIS — N390 Urinary tract infection, site not specified: Secondary | ICD-10-CM | POA: Diagnosis not present

## 2021-10-08 DIAGNOSIS — E871 Hypo-osmolality and hyponatremia: Secondary | ICD-10-CM | POA: Diagnosis not present

## 2021-10-08 DIAGNOSIS — F32A Depression, unspecified: Secondary | ICD-10-CM | POA: Diagnosis not present

## 2021-10-08 DIAGNOSIS — E46 Unspecified protein-calorie malnutrition: Secondary | ICD-10-CM | POA: Diagnosis not present

## 2021-10-08 DIAGNOSIS — Z7984 Long term (current) use of oral hypoglycemic drugs: Secondary | ICD-10-CM | POA: Diagnosis not present

## 2021-10-13 ENCOUNTER — Ambulatory Visit: Payer: Self-pay

## 2021-10-13 NOTE — Patient Outreach (Signed)
  Care Coordination   10/13/2021 Name: Lisa Mills MRN: [id number] DOB: 1929/01/22   Care Coordination Outreach Attempts:  An unsuccessful telephone outreach was attempted today to offer the patient information about available care coordination services as a benefit of their health plan.   Follow Up Plan:  Additional outreach attempts will be made to offer the patient care coordination information and services.   Encounter Outcome:  No Answer  Care Coordination Interventions Activated:  No   Care Coordination Interventions:  No, not indicated    Noreene Larsson RN, MSN, [facility]  Mobile: [phone]

## 2021-10-15 DIAGNOSIS — Z7984 Long term (current) use of oral hypoglycemic drugs: Secondary | ICD-10-CM | POA: Diagnosis not present

## 2021-10-15 DIAGNOSIS — K5904 Chronic idiopathic constipation: Secondary | ICD-10-CM | POA: Diagnosis not present

## 2021-10-15 DIAGNOSIS — E782 Mixed hyperlipidemia: Secondary | ICD-10-CM | POA: Diagnosis not present

## 2021-10-15 DIAGNOSIS — S72402D Unspecified fracture of lower end of left femur, subsequent encounter for closed fracture with routine healing: Secondary | ICD-10-CM | POA: Diagnosis not present

## 2021-10-15 DIAGNOSIS — F32A Depression, unspecified: Secondary | ICD-10-CM | POA: Diagnosis not present

## 2021-10-15 DIAGNOSIS — E46 Unspecified protein-calorie malnutrition: Secondary | ICD-10-CM | POA: Diagnosis not present

## 2021-10-15 DIAGNOSIS — M8580 Other specified disorders of bone density and structure, unspecified site: Secondary | ICD-10-CM | POA: Diagnosis not present

## 2021-10-15 DIAGNOSIS — H538 Other visual disturbances: Secondary | ICD-10-CM | POA: Diagnosis not present

## 2021-10-15 DIAGNOSIS — D62 Acute posthemorrhagic anemia: Secondary | ICD-10-CM | POA: Diagnosis not present

## 2021-10-15 DIAGNOSIS — G2581 Restless legs syndrome: Secondary | ICD-10-CM | POA: Diagnosis not present

## 2021-10-15 DIAGNOSIS — E1122 Type 2 diabetes mellitus with diabetic chronic kidney disease: Secondary | ICD-10-CM | POA: Diagnosis not present

## 2021-10-15 DIAGNOSIS — E871 Hypo-osmolality and hyponatremia: Secondary | ICD-10-CM | POA: Diagnosis not present

## 2021-10-15 DIAGNOSIS — K219 Gastro-esophageal reflux disease without esophagitis: Secondary | ICD-10-CM | POA: Diagnosis not present

## 2021-10-15 DIAGNOSIS — Z4789 Encounter for other orthopedic aftercare: Secondary | ICD-10-CM | POA: Diagnosis not present

## 2021-10-15 DIAGNOSIS — N1832 Chronic kidney disease, stage 3b: Secondary | ICD-10-CM | POA: Diagnosis not present

## 2021-10-15 DIAGNOSIS — Z9181 History of falling: Secondary | ICD-10-CM | POA: Diagnosis not present

## 2021-10-15 DIAGNOSIS — I129 Hypertensive chronic kidney disease with stage 1 through stage 4 chronic kidney disease, or unspecified chronic kidney disease: Secondary | ICD-10-CM | POA: Diagnosis not present

## 2021-10-15 DIAGNOSIS — N390 Urinary tract infection, site not specified: Secondary | ICD-10-CM | POA: Diagnosis not present

## 2021-10-15 DIAGNOSIS — E039 Hypothyroidism, unspecified: Secondary | ICD-10-CM | POA: Diagnosis not present

## 2021-10-15 DIAGNOSIS — Z87442 Personal history of urinary calculi: Secondary | ICD-10-CM | POA: Diagnosis not present

## 2021-10-16 ENCOUNTER — Telehealth: Payer: Self-pay

## 2021-10-16 NOTE — Chronic Care Management (AMB) (Signed)
  Care Coordination Note  10/16/2021 Name: Lisa Mills MRN: [id number] DOB: May 05, 1929  Lisa Mills is a 88 y.o. year old female who is a primary care patient of Olin Hauser, DO and is actively engaged with the care management team. I reached out to Raenette Rover by phone today to assist with re-scheduling a follow up visit with the RN Case Manager  Follow up plan: Unsuccessful telephone outreach attempt made. A HIPAA compliant phone message was left for the patient providing contact information and requesting a return call.  The care management team will reach out to the patient again over the next 7 days.  If patient returns call to provider office, please advise to call [place]  at [place], [place]  [place], [place] [address] Direct Dial: [phone] [email or web address]

## 2021-10-17 ENCOUNTER — Ambulatory Visit: Payer: Self-pay | Admitting: *Deleted

## 2021-10-17 NOTE — Telephone Encounter (Signed)
Chief Complaint: diarrhea at least 5 days Symptoms: thick watery stools Frequency: was 3xd but took Pepto Bismol this morning and has not had any since Pertinent Negatives: Patient denies fever, dizziness Disposition: '[]'$ ED /'[x]'$ Urgent Care (no appt availability in office) / '[x]'$ Appointment(In office/virtual)/ '[]'$  [place] [facility]/ '[]'$ Home Care/ '[]'$ Refused Recommended Disposition /'[]'$ [place] [facility]/ '[]'$  Follow-up with PCP Additional Notes: Pt states stool is black but she is on iron. Denied dizziness at all. Her BP was lower than usual at PT. Her son is going to come over this afternoon and take her BP.  Pt has appt Tuesday, and on wait list, I will notify office. Pt also states if the diarrhea does not stay under control with Pepto Bismol she will go to UC.   Reason for Disposition  [1] Mild diarrhea (e.g., 1-3 or more stools than normal in past 24 hours) without known cause AND [2] present >  7 days  Answer Assessment - Initial Assessment Questions 1. DIARRHEA SEVERITY: "How bad is the diarrhea?" "How many more stools have you had in the past 24 hours than normal?"    - NO DIARRHEA (SCALE 0)   - MILD (SCALE 1-3): Few loose or mushy BMs; increase of 1-3 stools over normal daily number of stools; mild increase in ostomy output.   -  MODERATE (SCALE 4-7): Increase of 4-6 stools daily over normal; moderate increase in ostomy output.   -  SEVERE (SCALE 8-10; OR "WORST POSSIBLE"): Increase of 7 or more stools daily over normal; moderate increase in ostomy output; incontinence.     Was 3xd but took [employer] and it has decreased 2. ONSET: "When did the diarrhea begin?"      A week ago 3. BM CONSISTENCY: "How loose or watery is the diarrhea?"      Not watery, thick liquid, black 4. VOMITING: "Are you also vomiting?" If Yes, ask: "How many times in the past 24 hours?"      no 5. ABDOMEN PAIN: "Are you having any abdomen pain?" If Yes, ask: "What does it feel like?" (e.g., crampy, dull,  intermittent, constant)      Crampy and low 6. ABDOMEN PAIN SEVERITY: If present, ask: "How bad is the pain?"  (e.g., Scale 1-10; mild, moderate, or severe)   - MILD (1-3): doesn't interfere with normal activities, abdomen soft and not tender to touch    - MODERATE (4-7): interferes with normal activities or awakens from sleep, abdomen tender to touch    - SEVERE (8-10): excruciating pain, doubled over, unable to do any normal activities       Uncomfortable but can stand it until it goes away 7. ORAL INTAKE: If vomiting, "Have you been able to drink liquids?" "How much liquids have you had in the past 24 hours?"     no 8. HYDRATION: "Any signs of dehydration?" (e.g., dry mouth [not just dry lips], too weak to stand, dizziness, new weight loss) "When did you last urinate?"     Not dizzy 9. EXPOSURE: "Have you traveled to a foreign country recently?" "Have you been exposed to anyone with diarrhea?" "Could you have eaten any food that was spoiled?"     no 10. ANTIBIOTIC USE: "Are you taking antibiotics now or have you taken antibiotics in the past 2 months?"       no 11. OTHER SYMPTOMS: "Do you have any other symptoms?" (e.g., fever, blood in stool)       Stool is black but also on  iron 12. PREGNANCY: "Is there any chance you are pregnant?" "When was your last menstrual period?"       no  Protocols used: [facility]

## 2021-10-20 ENCOUNTER — Telehealth: Payer: Self-pay | Admitting: Family Medicine

## 2021-10-20 DIAGNOSIS — S72402D Unspecified fracture of lower end of left femur, subsequent encounter for closed fracture with routine healing: Secondary | ICD-10-CM | POA: Diagnosis not present

## 2021-10-20 DIAGNOSIS — E871 Hypo-osmolality and hyponatremia: Secondary | ICD-10-CM | POA: Diagnosis not present

## 2021-10-20 DIAGNOSIS — K5904 Chronic idiopathic constipation: Secondary | ICD-10-CM | POA: Diagnosis not present

## 2021-10-20 DIAGNOSIS — H538 Other visual disturbances: Secondary | ICD-10-CM | POA: Diagnosis not present

## 2021-10-20 DIAGNOSIS — E039 Hypothyroidism, unspecified: Secondary | ICD-10-CM | POA: Diagnosis not present

## 2021-10-20 DIAGNOSIS — Z4789 Encounter for other orthopedic aftercare: Secondary | ICD-10-CM | POA: Diagnosis not present

## 2021-10-20 DIAGNOSIS — Z7984 Long term (current) use of oral hypoglycemic drugs: Secondary | ICD-10-CM | POA: Diagnosis not present

## 2021-10-20 DIAGNOSIS — G2581 Restless legs syndrome: Secondary | ICD-10-CM | POA: Diagnosis not present

## 2021-10-20 DIAGNOSIS — D62 Acute posthemorrhagic anemia: Secondary | ICD-10-CM | POA: Diagnosis not present

## 2021-10-20 DIAGNOSIS — N1832 Chronic kidney disease, stage 3b: Secondary | ICD-10-CM | POA: Diagnosis not present

## 2021-10-20 DIAGNOSIS — N390 Urinary tract infection, site not specified: Secondary | ICD-10-CM | POA: Diagnosis not present

## 2021-10-20 DIAGNOSIS — K219 Gastro-esophageal reflux disease without esophagitis: Secondary | ICD-10-CM | POA: Diagnosis not present

## 2021-10-20 DIAGNOSIS — E46 Unspecified protein-calorie malnutrition: Secondary | ICD-10-CM | POA: Diagnosis not present

## 2021-10-20 DIAGNOSIS — Z87442 Personal history of urinary calculi: Secondary | ICD-10-CM | POA: Diagnosis not present

## 2021-10-20 DIAGNOSIS — E782 Mixed hyperlipidemia: Secondary | ICD-10-CM | POA: Diagnosis not present

## 2021-10-20 DIAGNOSIS — Z9181 History of falling: Secondary | ICD-10-CM | POA: Diagnosis not present

## 2021-10-20 DIAGNOSIS — F32A Depression, unspecified: Secondary | ICD-10-CM | POA: Diagnosis not present

## 2021-10-20 DIAGNOSIS — I129 Hypertensive chronic kidney disease with stage 1 through stage 4 chronic kidney disease, or unspecified chronic kidney disease: Secondary | ICD-10-CM | POA: Diagnosis not present

## 2021-10-20 DIAGNOSIS — M8580 Other specified disorders of bone density and structure, unspecified site: Secondary | ICD-10-CM | POA: Diagnosis not present

## 2021-10-20 DIAGNOSIS — E1122 Type 2 diabetes mellitus with diabetic chronic kidney disease: Secondary | ICD-10-CM | POA: Diagnosis not present

## 2021-10-20 NOTE — Telephone Encounter (Signed)
No longer on blood thinners Dark stool can be pepto or iron Keep apt as planned and agree with return to care sooner if needed at urgent care  Nobie Putnam, [facility] 10/20/2021, 11:48 AM

## 2021-10-20 NOTE — Telephone Encounter (Signed)
Copied from [place] [id number]. Topic: Quick Communication - Home Health Verbal Orders >> Oct 20, 2021  4:34 PM Cyndi Bender wrote: Caller/Agency: Angie with [facility] Callback Number: [phone] Requesting OT/PT/Skilled Nursing/Social Work/Speech Therapy: PT Frequency: 1 x 9 weeks

## 2021-10-20 NOTE — Telephone Encounter (Signed)
FYI

## 2021-10-21 ENCOUNTER — Ambulatory Visit: Payer: PPO | Admitting: Internal Medicine

## 2021-10-21 NOTE — Telephone Encounter (Signed)
Okay to proceed with orders  Lisa Mills, [facility] 10/21/2021, 8:58 AM

## 2021-10-21 NOTE — Progress Notes (Deleted)
Subjective:    Patient ID: Lisa Mills, female    DOB: Nov 23, 1929, 88 y.o.   MRN: [id number]  HPI  Patient presents to clinic today with complaint of diarrhea.  This started.  She reports her stool is black however she takes iron and thinks that this is the cause.  She has tried Pepto-Bismol OTC.  Review of Systems     Past Medical History:  Diagnosis Date   Anemia    Anxiety    Chronic kidney disease    stage 3-4   Constipation    Depression    Dermatophytosis of nail    Diabetes mellitus without complication (HCC)    Diet controlled, lost weight, no meds   GERD (gastroesophageal reflux disease)    Hemorrhoids    History of shingles    HLD (hyperlipidemia)    Hypertension    Hypothyroidism    Keratoderma    Restless legs syndrome (RLS)    hx    Current Outpatient Medications  Medication Sig Dispense Refill   amLODipine (NORVASC) 5 MG tablet Take 1 tablet (5 mg total) by mouth daily. 90 tablet 3   Blood Glucose Monitoring Suppl (ONE TOUCH ULTRA 2) w/Device KIT Use to check blood sugar as advised up to 2 times daily 1 kit 0   Cephalexin 250 MG tablet Take 1 tablet (250 mg total) by mouth daily in the afternoon. UTI prevention. 90 tablet 3   Cholecalciferol (VITAMIN D) 50 MCG (2000 UT) CAPS Take 2,000 Units by mouth daily.      cyanocobalamin (VITAMIN B12) 1000 MCG tablet Take 1,000 mcg by mouth daily.     escitalopram (LEXAPRO) 10 MG tablet Take 1 tablet (10 mg total) by mouth daily. with food 90 tablet 3   feeding supplement (ENSURE ENLIVE / ENSURE PLUS) LIQD Take 237 mLs by mouth 2 (two) times daily between meals. 237 mL 12   ferrous sulfate 325 (65 FE) MG tablet Take 325 mg by mouth daily.      glucose blood (ONE TOUCH ULTRA TEST) test strip CHECK BLOOD SUGAR UP TO 2 TIMES A DAY. 200 each 11   glycerin adult 2 g suppository Place 1 suppository rectally as needed for constipation.     Histamine Dihydrochloride (AUSTRALIAN DREAM ARTHRITIS) 0.025 % CREA Apply 1  application topically daily as needed (Pain).     hydrocortisone (ANUSOL-HC) 2.5 % rectal cream USE 1 APPLICATION RECTALLY TWICE DAILY AS DIRECTED (Patient not taking: Reported on 07/22/2021) 30 g 0   hydrocortisone (ANUSOL-HC) 25 MG suppository Place 25 mg rectally 2 (two) times daily.     levothyroxine (SYNTHROID) 25 MCG tablet TAKE 1 TABLET BY MOUTH ONCE DAILY ON AN EMPTY STOMACH. WAIT 30 MINUTES BEFORE TAKING OTHER MEDS. 90 tablet 1   lisinopril (ZESTRIL) 5 MG tablet Take 1 tablet (5 mg total) by mouth daily. 90 tablet 0   metFORMIN (GLUCOPHAGE) 500 MG tablet Take 1 tablet (500 mg total) by mouth 2 (two) times daily with a meal. 180 tablet 3   omeprazole (PRILOSEC) 20 MG capsule TAKE 1 CAPSULE BY MOUTH ONCE DAILY (Patient taking differently: Take 20 mg by mouth daily before breakfast.) 90 capsule 2   OneTouch Delica Lancets 29J MISC Use to check blood sugar up to 2 times daily 200 each 12   polyethylene glycol (MIRALAX / GLYCOLAX) 17 g packet Take 17 g by mouth daily as needed for moderate constipation or severe constipation. (Patient taking differently: Take 17 g by mouth daily  as needed (constipation).) 14 each 0   senna (SENOKOT) 8.6 MG TABS tablet Take 1 tablet (8.6 mg total) by mouth daily. 120 tablet 0   simvastatin (ZOCOR) 40 MG tablet TAKE 1 TABLET BY MOUTH AT BEDTIME (Patient taking differently: Take 40 mg by mouth at bedtime.) 90 tablet 3   sodium chloride 1 g tablet Take 1 tablet (1 g total) by mouth 2 (two) times daily with a meal.     witch hazel-glycerin (TUCKS) pad 1 application  See admin instructions. Apply topically to rectum daily as needed for itching     No current facility-administered medications for this visit.    Allergies  Allergen Reactions   Aspirin Other (See Comments)    Burns stomach   Conray [Iothalamate] Hives    IV dye conray-400   Dye Fdc Red [Red Dye] Hives   Sulfa Antibiotics Other (See Comments)    Unknown reaction   Sulfasalazine Other (See Comments)     Unknown reaction   Prednisone Other (See Comments)    Indigestion     Family History  Adopted: Yes  Problem Relation Age of Onset   Breast cancer Neg Hx     Social History   Socioeconomic History   Marital status: Widowed    Spouse name: Not on file   Number of children: 2   Years of education: Not on file   Highest education level: Some college, no degree  Occupational History   Not on file  Tobacco Use   Smoking status: Never   Smokeless tobacco: Never  Vaping Use   Vaping Use: Never used  Substance and Sexual Activity   Alcohol use: Not Currently   Drug use: No   Sexual activity: Not on file  Other Topics Concern   Not on file  Social History Narrative   Not on file   Social Determinants of Health   Financial Resource Strain: Low Risk  (09/01/2021)   Overall Financial Resource Strain (CARDIA)    Difficulty of Paying Living Expenses: Not hard at all  Food Insecurity: No Food Insecurity (09/01/2021)   Hunger Vital Sign    Worried About Running Out of Food in the Last Year: Never true    Ran Out of Food in the Last Year: Never true  Transportation Needs: No Transportation Needs (09/01/2021)   PRAPARE - Hydrologist (Medical): No    Lack of Transportation (Non-Medical): No  Physical Activity: Insufficiently Active (09/01/2021)   Exercise Vital Sign    Days of Exercise per Week: 3 days    Minutes of Exercise per Session: 20 min  Stress: No Stress Concern Present (09/01/2021)   [place]    Feeling of Stress : Only a little  Social Connections: Moderately Isolated (09/01/2021)   Social Connection and Isolation Panel [NHANES]    Frequency of Communication with Friends and Family: More than three times a week    Frequency of Social Gatherings with Friends and Family: Three times a week    Attends Religious Services: More than 4 times per year    Active Member of Clubs or  Organizations: No    Attends Archivist Meetings: Never    Marital Status: Widowed  Intimate Partner Violence: Not At Risk (09/01/2021)   Humiliation, Afraid, Rape, and Kick questionnaire    Fear of Current or Ex-Partner: No    Emotionally Abused: No    Physically Abused: No  Sexually Abused: No     Constitutional: Denies fever, malaise, fatigue, headache or abrupt weight changes.  HEENT: Denies eye pain, eye redness, ear pain, ringing in the ears, wax buildup, runny nose, nasal congestion, bloody nose, or sore throat. Respiratory: Denies difficulty breathing, shortness of breath, cough or sputum production.   Cardiovascular: Denies chest pain, chest tightness, palpitations or swelling in the hands or feet.  Gastrointestinal: Pt reports diarrhea. Denies abdominal pain, bloating, constipation, today or blood in the stool.  GU: Denies urgency, frequency, pain with urination, burning sensation, blood in urine, odor or discharge. Musculoskeletal: Denies decrease in range of motion, difficulty with gait, muscle pain or joint pain and swelling.  Skin: Denies redness, rashes, lesions or ulcercations.  Neurological: Denies dizziness, difficulty with memory, difficulty with speech or problems with balance and coordination.  Psych: Denies anxiety, depression, SI/HI.  No other specific complaints in a complete review of systems (except as listed in HPI above).  Objective:   Physical Exam   LMP  (LMP Unknown)  Wt Readings from Last 3 Encounters:  09/03/21 102 lb (46.3 kg)  07/25/21 129 lb 3 oz (58.6 kg)  07/13/21 110 lb 0.1 oz (49.9 kg)    General: Appears their stated age, well developed, well nourished in NAD. Skin: Warm, dry and intact. No rashes, lesions or ulcerations noted. HEENT: Head: normal shape and size; Eyes: sclera white, no icterus, conjunctiva pink, PERRLA and EOMs intact; Ears: Tm's gray and intact, normal light reflex; Nose: mucosa pink and moist, septum midline;  Throat/Mouth: Teeth present, mucosa pink and moist, no exudate, lesions or ulcerations noted.  Neck:  Neck supple, trachea midline. No masses, lumps or thyromegaly present.  Cardiovascular: Normal rate and rhythm. S1,S2 noted.  No murmur, rubs or gallops noted. No JVD or BLE edema. No carotid bruits noted. Pulmonary/Chest: Normal effort and positive vesicular breath sounds. No respiratory distress. No wheezes, rales or ronchi noted.  Abdomen: Soft and nontender. Normal bowel sounds. No distention or masses noted. Liver, spleen and kidneys non palpable. Musculoskeletal: Normal range of motion. No signs of joint swelling. No difficulty with gait.  Neurological: Alert and oriented. Cranial nerves II-XII grossly intact. Coordination normal.  Psychiatric: Mood and affect normal. Behavior is normal. Judgment and thought content normal.    BMET    Component Value Date/Time   NA 127 (L) 07/26/2021 0147   NA 124 (A) 07/28/2019 0000   NA 140 06/28/2012 2341   K 4.6 07/26/2021 0147   K 4.2 06/28/2012 2341   CL 94 (L) 07/26/2021 0147   CL 109 (H) 06/28/2012 2341   CO2 26 07/26/2021 0147   CO2 25 06/28/2012 2341   GLUCOSE 152 (H) 07/26/2021 0147   GLUCOSE 89 06/28/2012 2341   BUN 20 07/26/2021 0147   BUN 21 07/28/2019 0000   BUN 27 (H) 06/28/2012 2341   CREATININE 1.09 (H) 07/26/2021 0147   CREATININE 1.02 (H) 03/14/2021 1353   CALCIUM 8.3 (L) 07/26/2021 0147   CALCIUM 8.9 06/28/2012 2341   GFRNONAA 49 (L) 07/26/2021 0147   GFRNONAA 54 (L) 09/06/2019 0808   GFRAA 63 09/06/2019 0808    Lipid Panel     Component Value Date/Time   CHOL 193 03/14/2021 1353   TRIG 100 03/14/2021 1353   HDL 75 03/14/2021 1353   CHOLHDL 2.6 03/14/2021 1353   VLDL 26 08/19/2016 0802   LDLCALC 98 03/14/2021 1353    CBC    Component Value Date/Time   WBC 8.9 07/27/2021 1223  RBC 3.00 (L) 07/27/2021 1223   HGB 9.4 (L) 07/27/2021 1223   HGB 9.7 (L) 06/28/2012 2341   HCT 27.2 (L) 07/27/2021 1223   HCT  28.6 (L) 06/28/2012 2341   PLT 616 (H) 07/27/2021 1223   PLT 255 06/28/2012 2341   MCV 90.7 07/27/2021 1223   MCV 92 06/28/2012 2341   MCH 31.3 07/27/2021 1223   MCHC 34.6 07/27/2021 1223   RDW 13.4 07/27/2021 1223   RDW 13.6 06/28/2012 2341   LYMPHSABS 3.3 07/12/2021 2059   MONOABS 0.7 07/12/2021 2059   EOSABS 0.2 07/12/2021 2059   BASOSABS 0.1 07/12/2021 2059    Hgb A1C Lab Results  Component Value Date   HGBA1C 7.5 (H) 03/14/2021           Assessment & Plan:      Webb Silversmith, NP

## 2021-10-22 ENCOUNTER — Telehealth: Payer: Self-pay | Admitting: Family Medicine

## 2021-10-22 NOTE — Telephone Encounter (Signed)
Okay to proceed with verbal orders  Nobie Putnam, [facility] 10/22/2021, 11:40 AM

## 2021-10-22 NOTE — Telephone Encounter (Signed)
Message eft on Angie's mail box.

## 2021-10-22 NOTE — Telephone Encounter (Signed)
Home Health Verbal Orders - Caller/Agency: Angie/ [facility] Number: [phone]  Requesting OT/PT/Skilled Nursing/Social Work/Speech Therapy: PT  Frequency: 1w8

## 2021-10-22 NOTE — Telephone Encounter (Signed)
Secure message left on Angie's voicemail

## 2021-10-29 DIAGNOSIS — D62 Acute posthemorrhagic anemia: Secondary | ICD-10-CM | POA: Diagnosis not present

## 2021-10-29 DIAGNOSIS — E46 Unspecified protein-calorie malnutrition: Secondary | ICD-10-CM | POA: Diagnosis not present

## 2021-10-29 DIAGNOSIS — K219 Gastro-esophageal reflux disease without esophagitis: Secondary | ICD-10-CM | POA: Diagnosis not present

## 2021-10-29 DIAGNOSIS — E782 Mixed hyperlipidemia: Secondary | ICD-10-CM | POA: Diagnosis not present

## 2021-10-29 DIAGNOSIS — Z7984 Long term (current) use of oral hypoglycemic drugs: Secondary | ICD-10-CM | POA: Diagnosis not present

## 2021-10-29 DIAGNOSIS — N1832 Chronic kidney disease, stage 3b: Secondary | ICD-10-CM | POA: Diagnosis not present

## 2021-10-29 DIAGNOSIS — I129 Hypertensive chronic kidney disease with stage 1 through stage 4 chronic kidney disease, or unspecified chronic kidney disease: Secondary | ICD-10-CM | POA: Diagnosis not present

## 2021-10-29 DIAGNOSIS — E871 Hypo-osmolality and hyponatremia: Secondary | ICD-10-CM | POA: Diagnosis not present

## 2021-10-29 DIAGNOSIS — G2581 Restless legs syndrome: Secondary | ICD-10-CM | POA: Diagnosis not present

## 2021-10-29 DIAGNOSIS — E039 Hypothyroidism, unspecified: Secondary | ICD-10-CM | POA: Diagnosis not present

## 2021-10-29 DIAGNOSIS — Z4789 Encounter for other orthopedic aftercare: Secondary | ICD-10-CM | POA: Diagnosis not present

## 2021-10-29 DIAGNOSIS — Z87442 Personal history of urinary calculi: Secondary | ICD-10-CM | POA: Diagnosis not present

## 2021-10-29 DIAGNOSIS — F32A Depression, unspecified: Secondary | ICD-10-CM | POA: Diagnosis not present

## 2021-10-29 DIAGNOSIS — E1122 Type 2 diabetes mellitus with diabetic chronic kidney disease: Secondary | ICD-10-CM | POA: Diagnosis not present

## 2021-10-29 DIAGNOSIS — M8580 Other specified disorders of bone density and structure, unspecified site: Secondary | ICD-10-CM | POA: Diagnosis not present

## 2021-10-29 DIAGNOSIS — H538 Other visual disturbances: Secondary | ICD-10-CM | POA: Diagnosis not present

## 2021-10-29 DIAGNOSIS — Z9181 History of falling: Secondary | ICD-10-CM | POA: Diagnosis not present

## 2021-10-29 DIAGNOSIS — S72402D Unspecified fracture of lower end of left femur, subsequent encounter for closed fracture with routine healing: Secondary | ICD-10-CM | POA: Diagnosis not present

## 2021-10-29 DIAGNOSIS — N390 Urinary tract infection, site not specified: Secondary | ICD-10-CM | POA: Diagnosis not present

## 2021-10-29 DIAGNOSIS — K5904 Chronic idiopathic constipation: Secondary | ICD-10-CM | POA: Diagnosis not present

## 2021-11-04 DIAGNOSIS — S7292XD Unspecified fracture of left femur, subsequent encounter for closed fracture with routine healing: Secondary | ICD-10-CM | POA: Diagnosis not present

## 2021-11-05 DIAGNOSIS — N1832 Chronic kidney disease, stage 3b: Secondary | ICD-10-CM | POA: Diagnosis not present

## 2021-11-05 DIAGNOSIS — K219 Gastro-esophageal reflux disease without esophagitis: Secondary | ICD-10-CM | POA: Diagnosis not present

## 2021-11-05 DIAGNOSIS — D62 Acute posthemorrhagic anemia: Secondary | ICD-10-CM | POA: Diagnosis not present

## 2021-11-05 DIAGNOSIS — Z4789 Encounter for other orthopedic aftercare: Secondary | ICD-10-CM | POA: Diagnosis not present

## 2021-11-05 DIAGNOSIS — E782 Mixed hyperlipidemia: Secondary | ICD-10-CM | POA: Diagnosis not present

## 2021-11-05 DIAGNOSIS — S72402D Unspecified fracture of lower end of left femur, subsequent encounter for closed fracture with routine healing: Secondary | ICD-10-CM | POA: Diagnosis not present

## 2021-11-05 DIAGNOSIS — F32A Depression, unspecified: Secondary | ICD-10-CM | POA: Diagnosis not present

## 2021-11-05 DIAGNOSIS — Z9181 History of falling: Secondary | ICD-10-CM | POA: Diagnosis not present

## 2021-11-05 DIAGNOSIS — N390 Urinary tract infection, site not specified: Secondary | ICD-10-CM | POA: Diagnosis not present

## 2021-11-05 DIAGNOSIS — Z87442 Personal history of urinary calculi: Secondary | ICD-10-CM | POA: Diagnosis not present

## 2021-11-05 DIAGNOSIS — E871 Hypo-osmolality and hyponatremia: Secondary | ICD-10-CM | POA: Diagnosis not present

## 2021-11-05 DIAGNOSIS — I129 Hypertensive chronic kidney disease with stage 1 through stage 4 chronic kidney disease, or unspecified chronic kidney disease: Secondary | ICD-10-CM | POA: Diagnosis not present

## 2021-11-05 DIAGNOSIS — Z7984 Long term (current) use of oral hypoglycemic drugs: Secondary | ICD-10-CM | POA: Diagnosis not present

## 2021-11-05 DIAGNOSIS — E1122 Type 2 diabetes mellitus with diabetic chronic kidney disease: Secondary | ICD-10-CM | POA: Diagnosis not present

## 2021-11-05 DIAGNOSIS — E46 Unspecified protein-calorie malnutrition: Secondary | ICD-10-CM | POA: Diagnosis not present

## 2021-11-05 DIAGNOSIS — H538 Other visual disturbances: Secondary | ICD-10-CM | POA: Diagnosis not present

## 2021-11-05 DIAGNOSIS — G2581 Restless legs syndrome: Secondary | ICD-10-CM | POA: Diagnosis not present

## 2021-11-05 DIAGNOSIS — E039 Hypothyroidism, unspecified: Secondary | ICD-10-CM | POA: Diagnosis not present

## 2021-11-05 DIAGNOSIS — K5904 Chronic idiopathic constipation: Secondary | ICD-10-CM | POA: Diagnosis not present

## 2021-11-05 DIAGNOSIS — M8580 Other specified disorders of bone density and structure, unspecified site: Secondary | ICD-10-CM | POA: Diagnosis not present

## 2021-11-11 DIAGNOSIS — E039 Hypothyroidism, unspecified: Secondary | ICD-10-CM | POA: Diagnosis not present

## 2021-11-11 DIAGNOSIS — H538 Other visual disturbances: Secondary | ICD-10-CM | POA: Diagnosis not present

## 2021-11-11 DIAGNOSIS — Z4789 Encounter for other orthopedic aftercare: Secondary | ICD-10-CM | POA: Diagnosis not present

## 2021-11-11 DIAGNOSIS — E871 Hypo-osmolality and hyponatremia: Secondary | ICD-10-CM | POA: Diagnosis not present

## 2021-11-11 DIAGNOSIS — E1122 Type 2 diabetes mellitus with diabetic chronic kidney disease: Secondary | ICD-10-CM | POA: Diagnosis not present

## 2021-11-11 DIAGNOSIS — Z9181 History of falling: Secondary | ICD-10-CM | POA: Diagnosis not present

## 2021-11-11 DIAGNOSIS — F32A Depression, unspecified: Secondary | ICD-10-CM | POA: Diagnosis not present

## 2021-11-11 DIAGNOSIS — G2581 Restless legs syndrome: Secondary | ICD-10-CM | POA: Diagnosis not present

## 2021-11-11 DIAGNOSIS — K219 Gastro-esophageal reflux disease without esophagitis: Secondary | ICD-10-CM | POA: Diagnosis not present

## 2021-11-11 DIAGNOSIS — Z7984 Long term (current) use of oral hypoglycemic drugs: Secondary | ICD-10-CM | POA: Diagnosis not present

## 2021-11-11 DIAGNOSIS — I129 Hypertensive chronic kidney disease with stage 1 through stage 4 chronic kidney disease, or unspecified chronic kidney disease: Secondary | ICD-10-CM | POA: Diagnosis not present

## 2021-11-11 DIAGNOSIS — E782 Mixed hyperlipidemia: Secondary | ICD-10-CM | POA: Diagnosis not present

## 2021-11-11 DIAGNOSIS — N1832 Chronic kidney disease, stage 3b: Secondary | ICD-10-CM | POA: Diagnosis not present

## 2021-11-11 DIAGNOSIS — Z87442 Personal history of urinary calculi: Secondary | ICD-10-CM | POA: Diagnosis not present

## 2021-11-11 DIAGNOSIS — S72402D Unspecified fracture of lower end of left femur, subsequent encounter for closed fracture with routine healing: Secondary | ICD-10-CM | POA: Diagnosis not present

## 2021-11-11 DIAGNOSIS — E46 Unspecified protein-calorie malnutrition: Secondary | ICD-10-CM | POA: Diagnosis not present

## 2021-11-11 DIAGNOSIS — M8580 Other specified disorders of bone density and structure, unspecified site: Secondary | ICD-10-CM | POA: Diagnosis not present

## 2021-11-11 DIAGNOSIS — K5904 Chronic idiopathic constipation: Secondary | ICD-10-CM | POA: Diagnosis not present

## 2021-11-11 DIAGNOSIS — D62 Acute posthemorrhagic anemia: Secondary | ICD-10-CM | POA: Diagnosis not present

## 2021-11-11 DIAGNOSIS — N390 Urinary tract infection, site not specified: Secondary | ICD-10-CM | POA: Diagnosis not present

## 2021-11-20 DIAGNOSIS — E782 Mixed hyperlipidemia: Secondary | ICD-10-CM | POA: Diagnosis not present

## 2021-11-20 DIAGNOSIS — E039 Hypothyroidism, unspecified: Secondary | ICD-10-CM | POA: Diagnosis not present

## 2021-11-20 DIAGNOSIS — I129 Hypertensive chronic kidney disease with stage 1 through stage 4 chronic kidney disease, or unspecified chronic kidney disease: Secondary | ICD-10-CM | POA: Diagnosis not present

## 2021-11-20 DIAGNOSIS — K219 Gastro-esophageal reflux disease without esophagitis: Secondary | ICD-10-CM | POA: Diagnosis not present

## 2021-11-20 DIAGNOSIS — E46 Unspecified protein-calorie malnutrition: Secondary | ICD-10-CM | POA: Diagnosis not present

## 2021-11-20 DIAGNOSIS — N1832 Chronic kidney disease, stage 3b: Secondary | ICD-10-CM | POA: Diagnosis not present

## 2021-11-20 DIAGNOSIS — F32A Depression, unspecified: Secondary | ICD-10-CM | POA: Diagnosis not present

## 2021-11-20 DIAGNOSIS — Z7984 Long term (current) use of oral hypoglycemic drugs: Secondary | ICD-10-CM | POA: Diagnosis not present

## 2021-11-20 DIAGNOSIS — E871 Hypo-osmolality and hyponatremia: Secondary | ICD-10-CM | POA: Diagnosis not present

## 2021-11-20 DIAGNOSIS — K5904 Chronic idiopathic constipation: Secondary | ICD-10-CM | POA: Diagnosis not present

## 2021-11-20 DIAGNOSIS — Z87442 Personal history of urinary calculi: Secondary | ICD-10-CM | POA: Diagnosis not present

## 2021-11-20 DIAGNOSIS — N390 Urinary tract infection, site not specified: Secondary | ICD-10-CM | POA: Diagnosis not present

## 2021-11-20 DIAGNOSIS — S72402D Unspecified fracture of lower end of left femur, subsequent encounter for closed fracture with routine healing: Secondary | ICD-10-CM | POA: Diagnosis not present

## 2021-11-20 DIAGNOSIS — H538 Other visual disturbances: Secondary | ICD-10-CM | POA: Diagnosis not present

## 2021-11-20 DIAGNOSIS — M8580 Other specified disorders of bone density and structure, unspecified site: Secondary | ICD-10-CM | POA: Diagnosis not present

## 2021-11-20 DIAGNOSIS — E1122 Type 2 diabetes mellitus with diabetic chronic kidney disease: Secondary | ICD-10-CM | POA: Diagnosis not present

## 2021-11-20 DIAGNOSIS — Z9181 History of falling: Secondary | ICD-10-CM | POA: Diagnosis not present

## 2021-11-20 DIAGNOSIS — D62 Acute posthemorrhagic anemia: Secondary | ICD-10-CM | POA: Diagnosis not present

## 2021-11-20 DIAGNOSIS — Z4789 Encounter for other orthopedic aftercare: Secondary | ICD-10-CM | POA: Diagnosis not present

## 2021-11-20 DIAGNOSIS — G2581 Restless legs syndrome: Secondary | ICD-10-CM | POA: Diagnosis not present

## 2021-11-26 DIAGNOSIS — Z9181 History of falling: Secondary | ICD-10-CM | POA: Diagnosis not present

## 2021-11-26 DIAGNOSIS — N390 Urinary tract infection, site not specified: Secondary | ICD-10-CM | POA: Diagnosis not present

## 2021-11-26 DIAGNOSIS — N1832 Chronic kidney disease, stage 3b: Secondary | ICD-10-CM | POA: Diagnosis not present

## 2021-11-26 DIAGNOSIS — G2581 Restless legs syndrome: Secondary | ICD-10-CM | POA: Diagnosis not present

## 2021-11-26 DIAGNOSIS — S72402D Unspecified fracture of lower end of left femur, subsequent encounter for closed fracture with routine healing: Secondary | ICD-10-CM | POA: Diagnosis not present

## 2021-11-26 DIAGNOSIS — Z4789 Encounter for other orthopedic aftercare: Secondary | ICD-10-CM | POA: Diagnosis not present

## 2021-11-26 DIAGNOSIS — Z87442 Personal history of urinary calculi: Secondary | ICD-10-CM | POA: Diagnosis not present

## 2021-11-26 DIAGNOSIS — K5904 Chronic idiopathic constipation: Secondary | ICD-10-CM | POA: Diagnosis not present

## 2021-11-26 DIAGNOSIS — H538 Other visual disturbances: Secondary | ICD-10-CM | POA: Diagnosis not present

## 2021-11-26 DIAGNOSIS — I129 Hypertensive chronic kidney disease with stage 1 through stage 4 chronic kidney disease, or unspecified chronic kidney disease: Secondary | ICD-10-CM | POA: Diagnosis not present

## 2021-11-26 DIAGNOSIS — E039 Hypothyroidism, unspecified: Secondary | ICD-10-CM | POA: Diagnosis not present

## 2021-11-26 DIAGNOSIS — Z7984 Long term (current) use of oral hypoglycemic drugs: Secondary | ICD-10-CM | POA: Diagnosis not present

## 2021-11-26 DIAGNOSIS — M8580 Other specified disorders of bone density and structure, unspecified site: Secondary | ICD-10-CM | POA: Diagnosis not present

## 2021-11-26 DIAGNOSIS — E782 Mixed hyperlipidemia: Secondary | ICD-10-CM | POA: Diagnosis not present

## 2021-11-26 DIAGNOSIS — E46 Unspecified protein-calorie malnutrition: Secondary | ICD-10-CM | POA: Diagnosis not present

## 2021-11-26 DIAGNOSIS — E1122 Type 2 diabetes mellitus with diabetic chronic kidney disease: Secondary | ICD-10-CM | POA: Diagnosis not present

## 2021-11-26 DIAGNOSIS — K219 Gastro-esophageal reflux disease without esophagitis: Secondary | ICD-10-CM | POA: Diagnosis not present

## 2021-11-26 DIAGNOSIS — F32A Depression, unspecified: Secondary | ICD-10-CM | POA: Diagnosis not present

## 2021-11-26 DIAGNOSIS — D62 Acute posthemorrhagic anemia: Secondary | ICD-10-CM | POA: Diagnosis not present

## 2021-11-26 DIAGNOSIS — E871 Hypo-osmolality and hyponatremia: Secondary | ICD-10-CM | POA: Diagnosis not present

## 2021-11-27 ENCOUNTER — Other Ambulatory Visit: Payer: Self-pay | Admitting: Family Medicine

## 2021-11-27 DIAGNOSIS — K219 Gastro-esophageal reflux disease without esophagitis: Secondary | ICD-10-CM

## 2021-11-27 NOTE — Telephone Encounter (Signed)
Requested medication (s) are due for refill today:   Yes  Requested medication (s) are on the active medication list:   Yes  Future visit scheduled:   Yes   Last ordered: 01/23/2021 #90, 2 refills  Returned because got a very high warning from pharmacy that pt has an allergy to red dye that causes hives.      Requested Prescriptions  Pending Prescriptions Disp Refills   omeprazole (PRILOSEC) 20 MG capsule [Pharmacy Med Name: OMEPRAZOLE DR 20 MG CAP] 90 capsule 2    Sig: TAKE 1 CAPSULE BY MOUTH ONCE DAILY     Gastroenterology: Proton Pump Inhibitors Passed - 11/27/2021  8:50 AM      Passed - Valid encounter within last 12 months    Recent Outpatient Visits           2 months ago S/p left hip fracture   [place], DO   8 months ago Type 2 diabetes mellitus with stage 3a chronic kidney disease, without long-term current use of insulin ([place])   [facility] Olin Hauser, DO   1 year ago Vitamin B12 deficiency   [place], DO   1 year ago Vitamin B12 deficiency   [place], DO   1 year ago Vitamin B12 deficiency   [place], DO       Future Appointments             In 1 week Parks Ranger, Devonne Doughty, [facility], [place]   In 1 month MacDiarmid, Nicki Reaper, [place]

## 2021-12-01 ENCOUNTER — Ambulatory Visit: Payer: PPO | Admitting: Urology

## 2021-12-03 DIAGNOSIS — N1832 Chronic kidney disease, stage 3b: Secondary | ICD-10-CM | POA: Diagnosis not present

## 2021-12-03 DIAGNOSIS — H538 Other visual disturbances: Secondary | ICD-10-CM | POA: Diagnosis not present

## 2021-12-03 DIAGNOSIS — E1122 Type 2 diabetes mellitus with diabetic chronic kidney disease: Secondary | ICD-10-CM | POA: Diagnosis not present

## 2021-12-03 DIAGNOSIS — G2581 Restless legs syndrome: Secondary | ICD-10-CM | POA: Diagnosis not present

## 2021-12-03 DIAGNOSIS — D62 Acute posthemorrhagic anemia: Secondary | ICD-10-CM | POA: Diagnosis not present

## 2021-12-03 DIAGNOSIS — E039 Hypothyroidism, unspecified: Secondary | ICD-10-CM | POA: Diagnosis not present

## 2021-12-03 DIAGNOSIS — Z9181 History of falling: Secondary | ICD-10-CM | POA: Diagnosis not present

## 2021-12-03 DIAGNOSIS — F32A Depression, unspecified: Secondary | ICD-10-CM | POA: Diagnosis not present

## 2021-12-03 DIAGNOSIS — Z7984 Long term (current) use of oral hypoglycemic drugs: Secondary | ICD-10-CM | POA: Diagnosis not present

## 2021-12-03 DIAGNOSIS — Z87442 Personal history of urinary calculi: Secondary | ICD-10-CM | POA: Diagnosis not present

## 2021-12-03 DIAGNOSIS — E46 Unspecified protein-calorie malnutrition: Secondary | ICD-10-CM | POA: Diagnosis not present

## 2021-12-03 DIAGNOSIS — E782 Mixed hyperlipidemia: Secondary | ICD-10-CM | POA: Diagnosis not present

## 2021-12-03 DIAGNOSIS — N390 Urinary tract infection, site not specified: Secondary | ICD-10-CM | POA: Diagnosis not present

## 2021-12-03 DIAGNOSIS — K219 Gastro-esophageal reflux disease without esophagitis: Secondary | ICD-10-CM | POA: Diagnosis not present

## 2021-12-03 DIAGNOSIS — I129 Hypertensive chronic kidney disease with stage 1 through stage 4 chronic kidney disease, or unspecified chronic kidney disease: Secondary | ICD-10-CM | POA: Diagnosis not present

## 2021-12-03 DIAGNOSIS — Z4789 Encounter for other orthopedic aftercare: Secondary | ICD-10-CM | POA: Diagnosis not present

## 2021-12-03 DIAGNOSIS — K5904 Chronic idiopathic constipation: Secondary | ICD-10-CM | POA: Diagnosis not present

## 2021-12-03 DIAGNOSIS — S72402D Unspecified fracture of lower end of left femur, subsequent encounter for closed fracture with routine healing: Secondary | ICD-10-CM | POA: Diagnosis not present

## 2021-12-03 DIAGNOSIS — M8580 Other specified disorders of bone density and structure, unspecified site: Secondary | ICD-10-CM | POA: Diagnosis not present

## 2021-12-03 DIAGNOSIS — E871 Hypo-osmolality and hyponatremia: Secondary | ICD-10-CM | POA: Diagnosis not present

## 2021-12-05 ENCOUNTER — Ambulatory Visit: Payer: PPO | Admitting: Family Medicine

## 2021-12-08 ENCOUNTER — Encounter: Payer: Self-pay | Admitting: Family Medicine

## 2021-12-08 ENCOUNTER — Ambulatory Visit (INDEPENDENT_AMBULATORY_CARE_PROVIDER_SITE_OTHER): Payer: PPO | Admitting: Family Medicine

## 2021-12-08 VITALS — BP 129/57 | HR 82 | Ht 59.0 in | Wt 96.8 lb

## 2021-12-08 DIAGNOSIS — N1831 Chronic kidney disease, stage 3a: Secondary | ICD-10-CM

## 2021-12-08 DIAGNOSIS — Z8781 Personal history of (healed) traumatic fracture: Secondary | ICD-10-CM | POA: Diagnosis not present

## 2021-12-08 DIAGNOSIS — E1122 Type 2 diabetes mellitus with diabetic chronic kidney disease: Secondary | ICD-10-CM | POA: Diagnosis not present

## 2021-12-08 DIAGNOSIS — Z23 Encounter for immunization: Secondary | ICD-10-CM

## 2021-12-08 LAB — POCT GLYCOSYLATED HEMOGLOBIN (HGB A1C): Hemoglobin A1C: 6.2 % — AB (ref 4.0–5.6)

## 2021-12-08 NOTE — Assessment & Plan Note (Addendum)
A1c down to 6.2 Complications - at risk hypoglycemia, nephropathy with CKD III-IV, also in setting HTN - Contraindicated - SGLT2. H/o failed Glyburide, Januvia. Not interested in injectable - off januvia ineffective  Plan:  1. Remain off Sulfonylurea avoid hypoglycemia. On Metformin 500 TWICE A DAY 2. Cannot take ASA due to CKD, continue statin, on ACEi 3. Encouraged lifestyle improvements with DM diet, continue walking

## 2021-12-08 NOTE — Patient Instructions (Addendum)
Thank you for coming to the office today.  Recent Labs    03/14/21 1353 12/08/21 1553  HGBA1C 7.5* 6.2*   Excellent A1c overall, very impressed.  Keep on Dec 18 at 330pm with Dr Matilde Sprang  Continue on Cephalexin '250mg'$  daily for antibiotic.  DUE for FASTING BLOOD WORK (no food or drink after midnight before the lab appointment, only water or coffee without cream/sugar on the morning of)  We will draw the blood AFTER your visit.   Please schedule a Follow-up Appointment to: Return in about 4 months (around 04/08/2022) for 4 month Annual Physical AM apt fasting lab AFTER.  If you have any other questions or concerns, please feel free to call the office or send a message through [place]. You may also schedule an earlier appointment if necessary.  Additionally, you may be receiving a survey about your experience at our office within a few days to 1 week by e-mail or mail. We value your feedback.  Nobie Putnam, DO [place]

## 2021-12-08 NOTE — Progress Notes (Signed)
Subjective:    Patient ID: Lisa Mills, female    DOB: 03/28/1929, 88 y.o.   MRN: [id number]  Lisa Mills is a 88 y.o. female presenting on 12/08/2021 for Diabetes   HPI  Type 2 Diabetes CKD IIIa Anemia CKD HTN Followed by Dr Candiss Norse in past for CKD >4+ years ago Iron supplement daily has improved, Hemoglobin up to 9.4, prior transfusion 07/2021 Due for A1c today On Lisinopril '5mg'$ , Amlodipine '5mg'$  daily (newly added by SNF) On Metformin '500mg'$  TWICE A DAY, doing well with this No hypoglycemia  Admits drinking Oatmilk, Glucerna   OneTouch Glucometer check once daily  UTI Prophylaxis Followed by Urology Dr Matilde Sprang, has apt in 01/05/22 Continues on Cephalexin '250mg'$  daily has plenty of refills  Chronic Left Hip problem S/p Left Hip Fracture Completed hospitalization surgery and SNF rehab facility Improving mobility overall. Using cane      12/08/2021    3:41 PM 09/01/2021   11:47 AM 03/14/2021    1:29 PM  Depression screen PHQ 2/9  Decreased Interest 0 0 0  Down, Depressed, Hopeless 0 0 0  PHQ - 2 Score 0 0 0  Altered sleeping 0  0  Tired, decreased energy 1  0  Change in appetite 0  0  Feeling bad or failure about yourself  0  0  Trouble concentrating 0  0  Moving slowly or fidgety/restless 0  0  Suicidal thoughts 0  0  PHQ-9 Score 1  0  Difficult doing work/chores Not difficult at all  Not difficult at all    Social History   Tobacco Use   Smoking status: Never   Smokeless tobacco: Never  Vaping Use   Vaping Use: Never used  Substance Use Topics   Alcohol use: Not Currently   Drug use: No    Review of Systems Per HPI unless specifically indicated above     Objective:    BP (!) 129/57   Pulse 82   Ht '4\' 11"'$  (1.499 m)   Wt 96 lb 12.8 oz (43.9 kg)   LMP  (LMP Unknown)   SpO2 98%   BMI 19.55 kg/m   Wt Readings from Last 3 Encounters:  12/08/21 96 lb 12.8 oz (43.9 kg)  09/03/21 102 lb (46.3 kg)  07/25/21 129 lb 3 oz (58.6 kg)     Physical Exam Vitals and nursing note reviewed.  Constitutional:      General: She is not in acute distress.    Appearance: Normal appearance. She is well-developed. She is not diaphoretic.     Comments: Thin, Well-appearing, comfortable, cooperative  HENT:     Head: Normocephalic and atraumatic.  Eyes:     General:        Right eye: No discharge.        Left eye: No discharge.     Conjunctiva/sclera: Conjunctivae normal.  Cardiovascular:     Rate and Rhythm: Normal rate.  Pulmonary:     Effort: Pulmonary effort is normal.  Skin:    General: Skin is warm and dry.     Findings: No erythema or rash.  Neurological:     Mental Status: She is alert and oriented to person, place, and time.  Psychiatric:        Mood and Affect: Mood normal.        Behavior: Behavior normal.        Thought Content: Thought content normal.     Comments: Well groomed, good eye contact, normal  speech and thoughts      Results for orders placed or performed in visit on 12/08/21  POCT HgB A1C  Result Value Ref Range   Hemoglobin A1C 6.2 (A) 4.0 - 5.6 %      Assessment & Plan:   Problem List Items Addressed This Visit     Type 2 diabetes with stage 3 chronic kidney disease GFR 30-59 (HCC) - Primary    A1c down to 6.2 Complications - at risk hypoglycemia, nephropathy with CKD III-IV, also in setting HTN - Contraindicated - SGLT2. H/o failed Glyburide, Januvia. Not interested in injectable - off januvia ineffective  Plan:  1. Remain off Sulfonylurea avoid hypoglycemia. On Metformin 500 TWICE A DAY 2. Cannot take ASA due to CKD, continue statin, on ACEi 3. Encouraged lifestyle improvements with DM diet, continue walking      Relevant Orders   POCT HgB A1C (Completed)   Other Visit Diagnoses     Needs flu shot       Relevant Orders   Flu Vaccine QUAD High Dose(Fluad) (Completed)   S/p left hip fracture           Weight loss initially post op Then has stabilized 2-3 months Improve  diet, supplement glucerna  Improved L Hip, healed Using cane / walker No further issues  Flu Shot today  Orders Placed This Encounter  Procedures   Flu Vaccine QUAD High Dose(Fluad)   POCT HgB A1C     No orders of the defined types were placed in this encounter.     Follow up plan: Return in about 4 months (around 04/08/2022) for 4 month Annual Physical AM apt fasting lab AFTER.   Nobie Putnam, [facility] 12/08/2021, 3:49 PM

## 2021-12-15 ENCOUNTER — Ambulatory Visit: Payer: Self-pay | Admitting: *Deleted

## 2021-12-15 NOTE — Patient Outreach (Signed)
  Care Coordination   Follow Up Visit Note   12/15/2021 Name: Lisa Mills MRN: [id number] DOB: [birth year]  Lisa Mills is a 88 y.o. year old female who sees Olin Hauser, DO for primary care. I spoke with  Raenette Rover by phone today.  What matters to the patients health and wellness today?  State she is "fine."  Much better as she has recovered from fall earlier this year.    Goals Addressed             This Visit's Progress    RNCM: Effective Management of Pain   On track    Care Coordination Interventions: Reviewed provider established plan for pain management. Denies any pain today state PT sessions have completed Discussed importance of adherence to all scheduled medical appointments. Urology appointment scheduled for 12/18  Counseled on the importance of reporting any/all new or changed pain symptoms or management strategies to pain management provider Advised patient to report to care team affect of pain on daily activities.  Medication review: Encouraged to notify this RNCM of any questions or difficulty to pay for meds Discussed use of relaxation techniques and/or diversional activities to assist with pain reduction (distraction, imagery, relaxation, massage, acupressure, TENS, heat, and cold application Reviewed with patient prescribed pharmacological and nonpharmacological pain relief strategies Advised patient to discuss unresolved pain, changes in level or intensity of pain, new needs related to PT, changes in other chronic conditions with provider Report PT completed but she will have a nurse from [facility] to make one more visit to the home for reassessment of needs. Discussed ongoing use of DME, cane and/or walker, to decrease risk of falling           SDOH assessments and interventions completed:  No     Care Coordination Interventions:  Yes, provided   Follow up plan: Follow up call scheduled for 1/16    Encounter Outcome:  Pt.  Visit Completed   Valente David, RN, MSN, [place] Care Management Care Management Coordinator [phone]

## 2021-12-19 DIAGNOSIS — G2581 Restless legs syndrome: Secondary | ICD-10-CM | POA: Diagnosis not present

## 2021-12-19 DIAGNOSIS — N1832 Chronic kidney disease, stage 3b: Secondary | ICD-10-CM | POA: Diagnosis not present

## 2021-12-19 DIAGNOSIS — Z7984 Long term (current) use of oral hypoglycemic drugs: Secondary | ICD-10-CM | POA: Diagnosis not present

## 2021-12-19 DIAGNOSIS — M8580 Other specified disorders of bone density and structure, unspecified site: Secondary | ICD-10-CM | POA: Diagnosis not present

## 2021-12-19 DIAGNOSIS — H538 Other visual disturbances: Secondary | ICD-10-CM | POA: Diagnosis not present

## 2021-12-19 DIAGNOSIS — K5904 Chronic idiopathic constipation: Secondary | ICD-10-CM | POA: Diagnosis not present

## 2021-12-19 DIAGNOSIS — F32A Depression, unspecified: Secondary | ICD-10-CM | POA: Diagnosis not present

## 2021-12-19 DIAGNOSIS — E46 Unspecified protein-calorie malnutrition: Secondary | ICD-10-CM | POA: Diagnosis not present

## 2021-12-19 DIAGNOSIS — E039 Hypothyroidism, unspecified: Secondary | ICD-10-CM | POA: Diagnosis not present

## 2021-12-19 DIAGNOSIS — I129 Hypertensive chronic kidney disease with stage 1 through stage 4 chronic kidney disease, or unspecified chronic kidney disease: Secondary | ICD-10-CM | POA: Diagnosis not present

## 2021-12-19 DIAGNOSIS — N390 Urinary tract infection, site not specified: Secondary | ICD-10-CM | POA: Diagnosis not present

## 2021-12-19 DIAGNOSIS — S72402D Unspecified fracture of lower end of left femur, subsequent encounter for closed fracture with routine healing: Secondary | ICD-10-CM | POA: Diagnosis not present

## 2021-12-19 DIAGNOSIS — Z9181 History of falling: Secondary | ICD-10-CM | POA: Diagnosis not present

## 2021-12-19 DIAGNOSIS — Z87442 Personal history of urinary calculi: Secondary | ICD-10-CM | POA: Diagnosis not present

## 2021-12-19 DIAGNOSIS — D62 Acute posthemorrhagic anemia: Secondary | ICD-10-CM | POA: Diagnosis not present

## 2021-12-19 DIAGNOSIS — Z4789 Encounter for other orthopedic aftercare: Secondary | ICD-10-CM | POA: Diagnosis not present

## 2021-12-19 DIAGNOSIS — K219 Gastro-esophageal reflux disease without esophagitis: Secondary | ICD-10-CM | POA: Diagnosis not present

## 2021-12-19 DIAGNOSIS — E1122 Type 2 diabetes mellitus with diabetic chronic kidney disease: Secondary | ICD-10-CM | POA: Diagnosis not present

## 2021-12-19 DIAGNOSIS — E782 Mixed hyperlipidemia: Secondary | ICD-10-CM | POA: Diagnosis not present

## 2021-12-19 DIAGNOSIS — E871 Hypo-osmolality and hyponatremia: Secondary | ICD-10-CM | POA: Diagnosis not present

## 2021-12-22 ENCOUNTER — Ambulatory Visit: Payer: PPO | Admitting: Urology

## 2021-12-30 ENCOUNTER — Other Ambulatory Visit: Payer: Self-pay | Admitting: Family Medicine

## 2021-12-30 DIAGNOSIS — N184 Chronic kidney disease, stage 4 (severe): Secondary | ICD-10-CM

## 2021-12-30 NOTE — Telephone Encounter (Signed)
Requested Prescriptions  Pending Prescriptions Disp Refills   lisinopril (ZESTRIL) 5 MG tablet [Pharmacy Med Name: LISINOPRIL 5 MG TAB] 90 tablet 1    Sig: TAKE 1 TABLET BY MOUTH ONCE DAILY     Cardiovascular:  ACE Inhibitors Failed - 12/30/2021  4:33 PM      Failed - Cr in normal range and within 180 days    Creat  Date Value Ref Range Status  03/14/2021 1.02 (H) 0.60 - 0.95 mg/dL Final   Creatinine, Ser  Date Value Ref Range Status  07/26/2021 1.09 (H) 0.44 - 1.00 mg/dL Final         Passed - K in normal range and within 180 days    Potassium  Date Value Ref Range Status  07/26/2021 4.6 3.5 - 5.1 mmol/L Final  06/28/2012 4.2 3.5 - 5.1 mmol/L Final         Passed - Patient is not pregnant      Passed - Last BP in normal range    BP Readings from Last 1 Encounters:  12/08/21 (!) 129/57         Passed - Valid encounter within last 6 months    Recent Outpatient Visits           3 weeks ago Type 2 diabetes mellitus with stage 3a chronic kidney disease, without long-term current use of insulin ([place])   [facility] Olin Hauser, DO   3 months ago S/p left hip fracture   [place], DO   9 months ago Type 2 diabetes mellitus with stage 3a chronic kidney disease, without long-term current use of insulin ([place])   [place], DO   1 year ago Vitamin B12 deficiency   [place], DO   1 year ago Vitamin B12 deficiency   [place], Devonne Doughty, DO       Future Appointments             In 6 days MacDiarmid, Nicki Reaper, MD [place]   In 3 months [place], Devonne Doughty, DO [facility]

## 2022-01-05 ENCOUNTER — Ambulatory Visit (INDEPENDENT_AMBULATORY_CARE_PROVIDER_SITE_OTHER): Payer: PPO | Admitting: Urology

## 2022-01-05 ENCOUNTER — Encounter: Payer: Self-pay | Admitting: Urology

## 2022-01-05 VITALS — BP 174/70 | HR 81 | Wt 98.1 lb

## 2022-01-05 DIAGNOSIS — Z87440 Personal history of urinary (tract) infections: Secondary | ICD-10-CM

## 2022-01-05 DIAGNOSIS — N3020 Other chronic cystitis without hematuria: Secondary | ICD-10-CM

## 2022-01-05 DIAGNOSIS — N390 Urinary tract infection, site not specified: Secondary | ICD-10-CM

## 2022-01-05 MED ORDER — CEPHALEXIN 250 MG PO TABS: 250.0000 mg | ORAL_TABLET | Freq: Every day | ORAL | 3 refills | Status: DC

## 2022-01-05 NOTE — Progress Notes (Signed)
01/05/2022 3:30 PM   Lisa Mills 28-Sep-1929 [id number]  Referring provider: Olin Hauser, DO [address] [place],  [place] [address]  Chief Complaint  Patient presents with   Follow-up    1 year follow up chronic cystitis    HPI: I reviewed the last lengthy note. When I saw her 1 year ago she was infection free on Macrodantin. She was almost completely continent except for foot on the floor syndrome and she held urination too long  Patient is infection free on Macrodantin. I went over the package label risks with her in detail. In my opinion is not related to her current healthcare conditions. I gave her the option of stopping it. She talked to neurology as well. Frequency stable     Today And infection free on daily Keflex.  Frequency stable.  Clinically not infected   PMH: Past Medical History:  Diagnosis Date   Anemia    Anxiety    Chronic kidney disease    stage 3-4   Constipation    Depression    Dermatophytosis of nail    Diabetes mellitus without complication (HCC)    Diet controlled, lost weight, no meds   GERD (gastroesophageal reflux disease)    Hemorrhoids    History of shingles    HLD (hyperlipidemia)    Hypertension    Hypothyroidism    Keratoderma    Restless legs syndrome (RLS)    hx    Surgical History: Past Surgical History:  Procedure Laterality Date   BREAST BIOPSY Right 09/30/2017   Affirm bx-calcs ( X clip),benign   BREAST EXCISIONAL BIOPSY Right    neg   cataracts     COLONOSCOPY W/ POLYPECTOMY     COLONOSCOPY WITH PROPOFOL N/A 02/10/2017   Procedure: COLONOSCOPY WITH PROPOFOL;  Surgeon: Manya Silvas, MD;  [facility] END[facility]COPY;  Service: Endoscopy;  Laterality: N/A;   ESOPHAGOGASTRODUODENOSCOPY (EGD) WITH PROPOFOL N/A 02/10/2017   Procedure: ESOPHAGOGASTRODUODENOSCOPY (EGD) WITH PROPOFOL;  Surgeon: Manya Silvas, MD;  Location: [facility] ENDOSCOPY;  Service: Endoscopy;  Laterality: N/A;   EYE SURGERY     bilateral  cataracts   HAND SURGERY     HARDWARE REMOVAL Left 07/23/2021   Procedure: HARDWARE REMOVAL;  Surgeon: Shona Needles, MD;  Location: [place];  Service: Orthopedics;  Laterality: Left;   INTRAMEDULLARY (IM) NAIL INTERTROCHANTERIC Left 07/13/2021   Procedure: INTRAMEDULLARY (IM) NAIL INTERTROCHANTRIC;  Surgeon: Corky Mull, MD;  Location: [facility] ORS;  Service: Orthopedics;  Laterality: Left;   INTRAMEDULLARY (IM) NAIL INTERTROCHANTERIC Left 07/23/2021   Procedure: INTRAMEDULLARY (IM) NAIL INTERTROCHANTRIC;  Surgeon: Shona Needles, MD;  Location: [place];  Service: Orthopedics;  Laterality: Left;   KIDNEY STONE SURGERY     ORIF FEMUR FRACTURE Left 07/23/2021   Procedure: OPEN REDUCTION INTERNAL FIXATION (ORIF) DISTAL FEMUR FRACTURE;  Surgeon: Shona Needles, MD;  Location: [place];  Service: Orthopedics;  Laterality: Left;   ORIF HUMERUS FRACTURE Right 02/13/2020   Procedure: OPEN REDUCTION INTERNAL FIXATION (ORIF) SUPRACONDYLAR  HUMERUS FRACTURE;  Surgeon: Altamese , MD;  Location: [place];  Service: Orthopedics;  Laterality: Right;   SACROPLASTY N/A 07/06/2019   Procedure: SACROPLASTY;  Surgeon: Hessie Knows, MD;  Location: [facility] ORS;  Service: Orthopedics;  Laterality: N/A;   TONSILECTOMY, ADENOIDECTOMY, BILATERAL MYRINGOTOMY AND TUBES     TONSILLECTOMY     TRIGGER FINGER RELEASE     Left ring finger   tubercular peritonitis      Home Medications:  Allergies as of 01/05/2022       Reactions   Aspirin Other (See Comments)   Burns stomach   Conray [iothalamate] Hives   IV dye conray-400   Dye Fdc Red [red Dye] Hives   Sulfa Antibiotics Other (See Comments)   Unknown reaction   Sulfasalazine Other (See Comments)   Unknown reaction   Prednisone Other (See Comments)   Indigestion         Medication List        Accurate as of January 05, 2022  3:30 PM. If you have any questions, ask your nurse or doctor.          STOP taking these medications    hydrocortisone 2.5 % rectal  cream Commonly known as: ANUSOL-HC Stopped by: Reece Packer, MD   hydrocortisone 25 MG suppository Commonly known as: ANUSOL-HC Stopped by: Reece Packer, MD       TAKE these medications    amLODipine 5 MG tablet Commonly known as: NORVASC Take 1 tablet (5 mg total) by mouth daily.   [place] Dream Arthritis 0.025 % Crea Generic drug: Histamine Dihydrochloride Apply 1 application topically daily as needed (Pain).   Cephalexin 250 MG tablet Take 1 tablet (250 mg total) by mouth daily in the afternoon. UTI prevention.   cyanocobalamin 1000 MCG tablet Commonly known as: VITAMIN B12 Take 1,000 mcg by mouth daily.   escitalopram 10 MG tablet Commonly known as: LEXAPRO Take 1 tablet (10 mg total) by mouth daily. with food   feeding supplement Liqd Take 237 mLs by mouth 2 (two) times daily between meals.   ferrous sulfate 325 (65 FE) MG tablet Take 325 mg by mouth daily.   glucose blood test strip Commonly known as: ONE TOUCH ULTRA TEST CHECK BLOOD SUGAR UP TO 2 TIMES A DAY.   glycerin adult 2 g suppository Place 1 suppository rectally as needed for constipation.   levothyroxine 25 MCG tablet Commonly known as: SYNTHROID TAKE 1 TABLET BY MOUTH ONCE DAILY ON AN EMPTY STOMACH. WAIT 30 MINUTES BEFORE TAKING OTHER MEDS.   lisinopril 5 MG tablet Commonly known as: ZESTRIL TAKE 1 TABLET BY MOUTH ONCE DAILY   metFORMIN 500 MG tablet Commonly known as: Glucophage Take 1 tablet (500 mg total) by mouth 2 (two) times daily with a meal.   omeprazole 20 MG capsule Commonly known as: PRILOSEC Take 1 capsule (20 mg total) by mouth daily before breakfast.   ONE TOUCH ULTRA 2 w/Device Kit Use to check blood sugar as advised up to 2 times daily   OneTouch Delica Lancets 45O Misc Use to check blood sugar up to 2 times daily   polyethylene glycol 17 g packet Commonly known as: MIRALAX / GLYCOLAX Take 17 g by mouth daily as needed for moderate constipation or  severe constipation. What changed: reasons to take this   senna 8.6 MG Tabs tablet Commonly known as: SENOKOT Take 1 tablet (8.6 mg total) by mouth daily.   simvastatin 40 MG tablet Commonly known as: ZOCOR TAKE 1 TABLET BY MOUTH AT BEDTIME   sodium chloride 1 g tablet Take 1 tablet (1 g total) by mouth 2 (two) times daily with a meal.   Vitamin D 50 MCG (2000 UT) Caps Take 2,000 Units by mouth daily.   witch hazel-glycerin pad Commonly known as: TUCKS 1 application  See admin instructions. Apply topically to rectum daily as needed for itching        Allergies:  Allergies  Allergen  Reactions   Aspirin Other (See Comments)    Burns stomach   Conray [Iothalamate] Hives    IV dye conray-400   Dye Fdc Red [Red Dye] Hives   Sulfa Antibiotics Other (See Comments)    Unknown reaction   Sulfasalazine Other (See Comments)    Unknown reaction   Prednisone Other (See Comments)    Indigestion     Family History: Family History  Adopted: Yes  Problem Relation Age of Onset   Breast cancer Neg Hx     Social History:  reports that she has never smoked. She has never used smokeless tobacco. She reports that she does not currently use alcohol. She reports that she does not use drugs.  ROS:                                        Physical Exam: BP (!) 174/70   Pulse 81   Wt 44.5 kg   LMP  (LMP Unknown)   BMI 19.82 kg/m   Constitutional:  Alert and oriented, No acute distress. HEENT: [place] AT, moist mucus membranes.  Trachea midline, no masses.   Laboratory Data: Lab Results  Component Value Date   WBC 8.9 07/27/2021   HGB 9.4 (L) 07/27/2021   HCT 27.2 (L) 07/27/2021   MCV 90.7 07/27/2021   PLT 616 (H) 07/27/2021    Lab Results  Component Value Date   CREATININE 1.09 (H) 07/26/2021    No results found for: "PSA"  No results found for: "TESTOSTERONE"  Lab Results  Component Value Date   HGBA1C 6.2 (A) 12/08/2021    Urinalysis     Component Value Date/Time   COLORURINE YELLOW 02/13/2020 0622   APPEARANCEUR CLEAR 02/13/2020 0622   APPEARANCEUR Hazy (A) 10/02/2019 1509   LABSPEC 1.008 02/13/2020 0622   PHURINE 6.0 02/13/2020 0622   GLUCOSEU NEGATIVE 02/13/2020 0622   HGBUR NEGATIVE 02/13/2020 0622   BILIRUBINUR NEGATIVE 02/13/2020 0622   BILIRUBINUR Negative 10/02/2019 1509   KETONESUR NEGATIVE 02/13/2020 0622   PROTEINUR NEGATIVE 02/13/2020 0622   UROBILINOGEN Normal 03/29/2017 0000   NITRITE NEGATIVE 02/13/2020 0622   LEUKOCYTESUR NEGATIVE 02/13/2020 0622    Pertinent Imaging:   Assessment & Plan: Keflex prescription renewed and I will see in 1 year  1. Chronic cystitis  - Urinalysis, Complete   No follow-ups on file.  Reece Packer, MD  [facility] [name], [place], [place] [address] [phone]

## 2022-01-14 ENCOUNTER — Telehealth: Payer: Self-pay | Admitting: *Deleted

## 2022-01-14 NOTE — Progress Notes (Signed)
  Care Coordination Note  01/14/2022 Name: Lisa Mills MRN: [id number] DOB: 06-Sep-1929  Lisa Mills is a 88 y.o. year old female who is a primary care patient of Olin Hauser, DO and is actively engaged with the care management team. I reached out to Raenette Rover by phone today to assist with re-scheduling a follow up visit with the RN Case Manager  Follow up plan: Unsuccessful telephone outreach attempt made. A HIPAA compliant phone message was left for the patient providing contact information and requesting a return call.   [place]  Direct Dial: [phone]

## 2022-01-20 ENCOUNTER — Telehealth: Payer: Self-pay | Admitting: *Deleted

## 2022-01-20 NOTE — Progress Notes (Signed)
  Care Coordination Note  01/20/2022 Name: [name] MRN: [id number] DOB: [birth year]  [name] is a 89 y.o. year old female who is a primary care patient of Olin Hauser, DO and is actively engaged with the care management team. I reached out to Raenette Rover by phone today to assist with re-scheduling a follow up visit with the RN Case Manager  Follow up plan: A third unsuccessful telephone outreach attempt made. We have been unable to make contact with the patient for follow up. The care management team is available to follow up with the patient after provider conversation with the patient regarding recommendation for care management engagement and subsequent re-referral to the care management team.   [place]  Direct Dial: [phone]

## 2022-01-20 NOTE — Progress Notes (Signed)
  Care Coordination Note  01/20/2022 Name: SARETTA DAHLEM MRN: [id number] DOB: [birth year]  KATERIN NEGRETE is a 89 y.o. year old female who is a primary care patient of Olin Hauser, DO and is actively engaged with the care management team.  Raenette Rover reached out by phone today to assist with canceling a follow up visit with the RN Case Manager  Follow up plan: Patient declines further follow up and engagement by the care management team. Appropriate care team members and provider have been notified via electronic communication.   [place]  Direct Dial: [phone]

## 2022-01-26 ENCOUNTER — Other Ambulatory Visit: Payer: Self-pay | Admitting: Family Medicine

## 2022-01-26 DIAGNOSIS — E1169 Type 2 diabetes mellitus with other specified complication: Secondary | ICD-10-CM

## 2022-01-27 NOTE — Telephone Encounter (Signed)
Requested Prescriptions  Pending Prescriptions Disp Refills   simvastatin (ZOCOR) 40 MG tablet [Pharmacy Med Name: SIMVASTATIN 40 MG TAB] 90 tablet 3    Sig: TAKE 1 TABLET BY MOUTH AT BEDTIME     Cardiovascular:  Antilipid - Statins Failed - 01/26/2022 10:17 AM      Failed - Lipid Panel in normal range within the last 12 months    Cholesterol  Date Value Ref Range Status  03/14/2021 193 <200 mg/dL Final   LDL Cholesterol (Calc)  Date Value Ref Range Status  03/14/2021 98 mg/dL (calc) Final    Comment:    Reference range: <100 . Desirable range <100 mg/dL for primary prevention;   <70 mg/dL for patients with CHD or diabetic patients  with > or = 2 CHD risk factors. Marland Kitchen LDL-C is now calculated using the Martin-Hopkins  calculation, which is a validated novel method providing  better accuracy than the Friedewald equation in the  estimation of LDL-C.  Cresenciano Genre et al. Annamaria Helling. 7793;903(00): 2061-2068  (http://education.[employer].com/faq/FAQ164)    HDL  Date Value Ref Range Status  03/14/2021 75 > OR = 50 mg/dL Final   Triglycerides  Date Value Ref Range Status  03/14/2021 100 <150 mg/dL Final         Passed - Patient is not pregnant      Passed - Valid encounter within last 12 months    Recent Outpatient Visits           1 month ago Type 2 diabetes mellitus with stage 3a chronic kidney disease, without long-term current use of insulin ([place])   [facility] [place], Devonne Doughty, DO   4 months ago S/p left hip fracture   [place], DO   10 months ago Type 2 diabetes mellitus with stage 3a chronic kidney disease, without long-term current use of insulin ([place])   [facility] Olin Hauser, DO   1 year ago Vitamin B12 deficiency   [place], DO   1 year ago Vitamin B12 deficiency   [place], DO        Future Appointments             In 2 months [place] Ranger, Devonne Doughty, [facility], [place]   In 11 months Bjorn Loser, [place]

## 2022-02-03 ENCOUNTER — Encounter: Payer: PPO | Admitting: *Deleted

## 2022-02-17 DIAGNOSIS — S7292XD Unspecified fracture of left femur, subsequent encounter for closed fracture with routine healing: Secondary | ICD-10-CM | POA: Diagnosis not present

## 2022-03-16 DIAGNOSIS — H04123 Dry eye syndrome of bilateral lacrimal glands: Secondary | ICD-10-CM | POA: Diagnosis not present

## 2022-03-16 DIAGNOSIS — Z961 Presence of intraocular lens: Secondary | ICD-10-CM | POA: Diagnosis not present

## 2022-03-16 DIAGNOSIS — E119 Type 2 diabetes mellitus without complications: Secondary | ICD-10-CM | POA: Diagnosis not present

## 2022-03-16 LAB — HM DIABETES EYE EXAM

## 2022-04-06 ENCOUNTER — Other Ambulatory Visit: Payer: Self-pay | Admitting: Family Medicine

## 2022-04-06 DIAGNOSIS — E1122 Type 2 diabetes mellitus with diabetic chronic kidney disease: Secondary | ICD-10-CM

## 2022-04-07 NOTE — Telephone Encounter (Signed)
Unable to refill per protocol, Rx request is too soon. Last refill 12/30/21 for 90 days and 1 refill.  Requested Prescriptions  Pending Prescriptions Disp Refills   lisinopril (ZESTRIL) 5 MG tablet [Pharmacy Med Name: LISINOPRIL 5 MG TAB] 90 tablet 1    Sig: TAKE 1 TABLET BY MOUTH ONCE DAILY     Cardiovascular:  ACE Inhibitors Failed - 04/06/2022  9:26 AM      Failed - Cr in normal range and within 180 days    Creat  Date Value Ref Range Status  03/14/2021 1.02 (H) 0.60 - 0.95 mg/dL Final   Creatinine, Ser  Date Value Ref Range Status  07/26/2021 1.09 (H) 0.44 - 1.00 mg/dL Final         Failed - K in normal range and within 180 days    Potassium  Date Value Ref Range Status  07/26/2021 4.6 3.5 - 5.1 mmol/L Final  06/28/2012 4.2 3.5 - 5.1 mmol/L Final         Failed - Last BP in normal range    BP Readings from Last 1 Encounters:  01/05/22 (!) 174/70         Passed - Patient is not pregnant      Passed - Valid encounter within last 6 months    Recent Outpatient Visits           4 months ago Type 2 diabetes mellitus with stage 3a chronic kidney disease, without long-term current use of insulin ([place])   [place], Alexander J, DO   7 months ago S/p left hip fracture   [place], DO   1 year ago Type 2 diabetes mellitus with stage 3a chronic kidney disease, without long-term current use of insulin [facility])   [place], DO   1 year ago Vitamin B12 deficiency   [place], DO   1 year ago Vitamin B12 deficiency   [place], Devonne Doughty, DO       Future Appointments             In 1 week Parks Ranger, Devonne Doughty, DO [facility], [place]   In 9 months MacDiarmid, Nicki Reaper, [place]

## 2022-04-17 ENCOUNTER — Encounter: Payer: Self-pay | Admitting: Family Medicine

## 2022-04-17 ENCOUNTER — Ambulatory Visit (INDEPENDENT_AMBULATORY_CARE_PROVIDER_SITE_OTHER): Payer: PPO | Admitting: Family Medicine

## 2022-04-17 VITALS — BP 102/70 | HR 69 | Ht 59.0 in | Wt 99.2 lb

## 2022-04-17 DIAGNOSIS — E538 Deficiency of other specified B group vitamins: Secondary | ICD-10-CM | POA: Diagnosis not present

## 2022-04-17 DIAGNOSIS — F418 Other specified anxiety disorders: Secondary | ICD-10-CM

## 2022-04-17 DIAGNOSIS — E063 Autoimmune thyroiditis: Secondary | ICD-10-CM | POA: Diagnosis not present

## 2022-04-17 DIAGNOSIS — I129 Hypertensive chronic kidney disease with stage 1 through stage 4 chronic kidney disease, or unspecified chronic kidney disease: Secondary | ICD-10-CM | POA: Diagnosis not present

## 2022-04-17 DIAGNOSIS — Z0000 Encounter for general adult medical examination without abnormal findings: Secondary | ICD-10-CM | POA: Diagnosis not present

## 2022-04-17 DIAGNOSIS — N1831 Chronic kidney disease, stage 3a: Secondary | ICD-10-CM

## 2022-04-17 DIAGNOSIS — N183 Chronic kidney disease, stage 3 unspecified: Secondary | ICD-10-CM | POA: Diagnosis not present

## 2022-04-17 DIAGNOSIS — E038 Other specified hypothyroidism: Secondary | ICD-10-CM | POA: Diagnosis not present

## 2022-04-17 DIAGNOSIS — D631 Anemia in chronic kidney disease: Secondary | ICD-10-CM

## 2022-04-17 DIAGNOSIS — E1122 Type 2 diabetes mellitus with diabetic chronic kidney disease: Secondary | ICD-10-CM

## 2022-04-17 DIAGNOSIS — N184 Chronic kidney disease, stage 4 (severe): Secondary | ICD-10-CM

## 2022-04-17 DIAGNOSIS — E1169 Type 2 diabetes mellitus with other specified complication: Secondary | ICD-10-CM | POA: Diagnosis not present

## 2022-04-17 DIAGNOSIS — E785 Hyperlipidemia, unspecified: Secondary | ICD-10-CM | POA: Diagnosis not present

## 2022-04-17 MED ORDER — ESCITALOPRAM OXALATE 10 MG PO TABS: 10.0000 mg | ORAL_TABLET | Freq: Every day | ORAL | 3 refills | Status: DC

## 2022-04-17 MED ORDER — LISINOPRIL 5 MG PO TABS: 5.0000 mg | ORAL_TABLET | Freq: Every day | ORAL | 3 refills | Status: DC

## 2022-04-17 MED ORDER — SIMVASTATIN 40 MG PO TABS: 40.0000 mg | ORAL_TABLET | Freq: Every day | ORAL | 3 refills | Status: DC

## 2022-04-17 MED ORDER — LEVOTHYROXINE SODIUM 25 MCG PO TABS: ORAL_TABLET | ORAL | 3 refills | Status: DC

## 2022-04-17 NOTE — Patient Instructions (Addendum)
Thank you for coming to the office today.  Refilled all medications  Continue iron supplement Ferrous Sulfate 325 daily + Vitamin C supplement any dose, or OJ or other source  ----------  Please re-schedule with Dr Candiss Norse - [facility]  Last seen Feb 2023 - due 1 year, now anytime in 2024.  Murlean Iba, MD [facility] [address], [place] [address] Ph: [phone] Fax: [phone]   ------  Labs today   Please discuss the urinary leakage incontinence with Urology - Dr Verneita Griffes [facility] [place],  [place]  [address] Phone: [phone]    Please schedule a Follow-up Appointment to: Return in about 6 months (around 10/18/2022) for 6 month DM A1c Anemia CKD.  If you have any other questions or concerns, please feel free to call the office or send a message through [place]. You may also schedule an earlier appointment if necessary.  Additionally, you may be receiving a survey about your experience at our office within a few days to 1 week by e-mail or mail. We value your feedback.  Nobie Putnam, DO [place]

## 2022-04-17 NOTE — Assessment & Plan Note (Signed)
Improved hemoglobin but still overall low Followed by Nephro, needs to re-schedule CKD-III underlying cause Will continue oral iron supplement daily, ADD VITAMIN C Check labs

## 2022-04-17 NOTE — Assessment & Plan Note (Signed)
Controlled BP low readings - Home BP readings reviewed  Complication with CKDIII   Plan:  1. Continue current BP regimen Lisinopril 5mg  daily Remain OFF Atenolol - failed w side effect (was on for tremor)  2. Encourage improved lifestyle - low sodium diet, regular exercise 3. Continue monitor BP outside office, bring readings to next visit, if persistently >140/90 or new symptoms notify office sooner

## 2022-04-17 NOTE — Assessment & Plan Note (Signed)
Prior A1c controlled,due for labs Complications - at risk hypoglycemia, nephropathy with CKD III-IV, also in setting HTN - Contraindicated - SGLT2. H/o failed Glyburide, Januvia. Not interested in injectable - off januvia ineffective  Plan:  1. Remain off Sulfonylurea avoid hypoglycemia. On Metformin 500 TWICE A DAY 2. Cannot take ASA due to CKD, continue statin, on ACEi 3. Encouraged lifestyle improvements with DM diet, continue walking

## 2022-04-17 NOTE — Progress Notes (Signed)
Subjective:    Patient ID: Lisa Mills, female    DOB: July 06, 1929, 89 y.o.   MRN: [id number]  Lisa Mills is a 89 y.o. female presenting on 04/17/2022 for Annual Exam   HPI  Here for Annual Physical and LAB ORDERS  Type 2 Diabetes CKD IIIa Anemia CKD HTN Followed by Dr Candiss Norse in past for CKD >4+ years ago Iron supplement daily has improved, Hemoglobin up to 9.4, prior transfusion 07/2021 She could not tolerate 2 iron pills. Due for A1c today with labs On Lisinopril 5mg , Amlodipine 5mg  daily (newly added by SNF) On Metformin 500mg  TWICE A DAY, doing well with this No hypoglycemia    UTI Prophylaxis Followed by Urology Dr Matilde Sprang, has apt in 01/05/22 Continues on Cephalexin 250mg  daily has plenty of refills   Chronic Left Hip problem S/p Left Hip Fracture Completed hospitalization surgery and SNF rehab facility Improving mobility overall. Using cane  Urinary Incontinence, Urgency/Frequency Chronic Cystitis Followed by Dr Gwyndolyn K[facility] Last seen 12/2021, she is on chronic antibiotic prophylaxis Reports urinary frequency every 2-3 hours during day She wears pad for urination. Will be full overnight. She admits if too slow to get to bathroom can have urge and difficulty  CKD-IIIa Renal Cysts, bilateral Last seen by Dr Sherri Rad 02/2021, she is overdue for next visit in 2024.  Right hand problem with numbness tingling, loss of function.       04/17/2022    1:03 PM 04/17/2022    1:02 PM 12/08/2021    3:41 PM  Depression screen PHQ 2/9  Decreased Interest 0 0 0  Down, Depressed, Hopeless 0 0 0  PHQ - 2 Score 0 0 0  Altered sleeping 0  0  Tired, decreased energy 0  1  Change in appetite 0  0  Feeling bad or failure about yourself  0  0  Trouble concentrating 0  0  Moving slowly or fidgety/restless 0  0  Suicidal thoughts 0  0  PHQ-9 Score 0  1  Difficult doing work/chores Not difficult at all  Not difficult at all      12/08/2021    3:42 PM  03/14/2021    1:29 PM 07/01/2020   11:30 AM 03/02/2018   11:07 AM  GAD 7 : Generalized Anxiety Score  Nervous, Anxious, on Edge 1 1 0 0  Control/stop worrying 0 0 0 0  Worry too much - different things 0 0 0 0  Trouble relaxing 0 0 0 0  Restless 0 0 0 0  Easily annoyed or irritable 0 0  0  Afraid - awful might happen 0 0 0 0  Total GAD 7 Score 1 1  0  Anxiety Difficulty Not difficult at all Not difficult at all Not difficult at all Not difficult at all   \  Past Medical History:  Diagnosis Date   Anemia    Anxiety    Chronic kidney disease    stage 3-4   Constipation    Depression    Dermatophytosis of nail    Diabetes mellitus without complication (HCC)    Diet controlled, lost weight, no meds   GERD (gastroesophageal reflux disease)    Hemorrhoids    History of shingles    HLD (hyperlipidemia)    Hypertension    Hypothyroidism    Keratoderma    Restless legs syndrome (RLS)    hx   Past Surgical History:  Procedure Laterality Date   BREAST BIOPSY Right  09/30/2017   Affirm bx-calcs ( X clip),benign   BREAST EXCISIONAL BIOPSY Right    neg   cataracts     COLONOSCOPY W/ POLYPECTOMY     COLONOSCOPY WITH PROPOFOL N/A 02/10/2017   Procedure: COLONOSCOPY WITH PROPOFOL;  Surgeon: Manya Silvas, MD;  Location: [facility] ENDOSCOPY;  Service: Endoscopy;  Laterality: N/A;   ESOPHAGOGASTRODUODENOSCOPY (EGD) WITH PROPOFOL N/A 02/10/2017   Procedure: ESOPHAGOGASTRODUODENOSCOPY (EGD) WITH PROPOFOL;  Surgeon: Manya Silvas, MD;  Location: [facility] ENDOSCOPY;  Service: Endoscopy;  Laterality: N/A;   EYE SURGERY     bilateral cataracts   HAND SURGERY     HARDWARE REMOVAL Left 07/23/2021   Procedure: HARDWARE REMOVAL;  Surgeon: Shona Needles, MD;  Location: [place];  Service: Orthopedics;  Laterality: Left;   INTRAMEDULLARY (IM) NAIL INTERTROCHANTERIC Left 07/13/2021   Procedure: INTRAMEDULLARY (IM) NAIL INTERTROCHANTRIC;  Surgeon: Corky Mull, MD;  Location: [facility];  Service:  Orthopedics;  Laterality: Left;   INTRAMEDULLARY (IM) NAIL INTERTROCHANTERIC Left 07/23/2021   Procedure: INTRAMEDULLARY (IM) NAIL INTERTROCHANTRIC;  Surgeon: Shona Needles, MD;  Location: [place];  Service: Orthopedics;  Laterality: Left;   KIDNEY STONE SURGERY     ORIF FEMUR FRACTURE Left 07/23/2021   Procedure: OPEN REDUCTION INTERNAL FIXATION (ORIF) DISTAL FEMUR FRACTURE;  Surgeon: Shona Needles, MD;  Location: [place];  Service: Orthopedics;  Laterality: Left;   ORIF HUMERUS FRACTURE Right 02/13/2020   Procedure: OPEN REDUCTION INTERNAL FIXATION (ORIF) SUPRACONDYLAR  HUMERUS FRACTURE;  Surgeon: Altamese [place], MD;  Location: [place];  Service: Orthopedics;  Laterality: Right;   SACROPLASTY N/A 07/06/2019   Procedure: SACROPLASTY;  Surgeon: Hessie Knows, MD;  Location: [facility];  Service: Orthopedics;  Laterality: N/A;   TONSILECTOMY, ADENOIDECTOMY, BILATERAL MYRINGOTOMY AND TUBES     TONSILLECTOMY     TRIGGER FINGER RELEASE     Left ring finger   tubercular peritonitis     Social History   Socioeconomic History   Marital status: Widowed    Spouse name: Not on file   Number of children: 2   Years of education: Not on file   Highest education level: Some college, no degree  Occupational History   Not on file  Tobacco Use   Smoking status: Never   Smokeless tobacco: Never  Vaping Use   Vaping Use: Never used  Substance and Sexual Activity   Alcohol use: Not Currently   Drug use: No   Sexual activity: Not on file  Other Topics Concern   Not on file  Social History Narrative   Not on file   Social Determinants of Health   Financial Resource Strain: Low Risk  (09/01/2021)   Overall Financial Resource Strain (CARDIA)    Difficulty of Paying Living Expenses: Not hard at all  Food Insecurity: No Food Insecurity (09/01/2021)   Hunger Vital Sign    Worried About Running Out of Food in the Last Year: Never true    [place] in the Last Year: Never true  Transportation Needs:  Unknown (09/01/2021)   PRAPARE - Hydrologist (Medical): Not on file    Lack of Transportation (Non-Medical): No  Physical Activity: Insufficiently Active (09/01/2021)   Exercise Vital Sign    Days of Exercise per Week: 3 days    Minutes of Exercise per Session: 20 min  Stress: No Stress Concern Present (09/01/2021)   [place]    Feeling of  Stress : Only a little  Social Connections: Moderately Isolated (09/01/2021)   Social Connection and Isolation Panel [NHANES]    Frequency of Communication with Friends and Family: More than three times a week    Frequency of Social Gatherings with Friends and Family: Three times a week    Attends Religious Services: More than 4 times per year    Active Member of Clubs or Organizations: No    Attends Archivist Meetings: Never    Marital Status: Widowed  Intimate Partner Violence: Not At Risk (09/01/2021)   Humiliation, Afraid, Rape, and Kick questionnaire    Fear of Current or Ex-Partner: No    Emotionally Abused: No    Physically Abused: No    Sexually Abused: No   Family History  Adopted: Yes  Problem Relation Age of Onset   Breast cancer Neg Hx    Current Outpatient Medications on File Prior to Visit  Medication Sig   amLODipine (NORVASC) 5 MG tablet Take 1 tablet (5 mg total) by mouth daily.   Blood Glucose Monitoring Suppl (ONE TOUCH ULTRA 2) w/Device KIT Use to check blood sugar as advised up to 2 times daily   Cephalexin 250 MG tablet Take 1 tablet (250 mg total) by mouth daily in the afternoon. UTI prevention.   Cholecalciferol (VITAMIN D) 50 MCG (2000 UT) CAPS Take 2,000 Units by mouth daily.    cyanocobalamin (VITAMIN B12) 1000 MCG tablet Take 1,000 mcg by mouth daily.   feeding supplement (ENSURE ENLIVE / ENSURE PLUS) LIQD Take 237 mLs by mouth 2 (two) times daily between meals.   ferrous sulfate 325 (65 FE) MG tablet Take 325 mg  by mouth daily.    glucose blood (ONE TOUCH ULTRA TEST) test strip CHECK BLOOD SUGAR UP TO 2 TIMES A DAY.   glycerin adult 2 g suppository Place 1 suppository rectally as needed for constipation.   Histamine Dihydrochloride (AUSTRALIAN DREAM ARTHRITIS) 0.025 % CREA Apply 1 application topically daily as needed (Pain).   metFORMIN (GLUCOPHAGE) 500 MG tablet Take 1 tablet (500 mg total) by mouth 2 (two) times daily with a meal.   omeprazole (PRILOSEC) 20 MG capsule Take 1 capsule (20 mg total) by mouth daily before breakfast.   OneTouch Delica Lancets 99991111 MISC Use to check blood sugar up to 2 times daily   polyethylene glycol (MIRALAX / GLYCOLAX) 17 g packet Take 17 g by mouth daily as needed for moderate constipation or severe constipation. (Patient taking differently: Take 17 g by mouth daily as needed (constipation).)   senna (SENOKOT) 8.6 MG TABS tablet Take 1 tablet (8.6 mg total) by mouth daily.   sodium chloride 1 g tablet Take 1 tablet (1 g total) by mouth 2 (two) times daily with a meal.   witch hazel-glycerin (TUCKS) pad 1 application  See admin instructions. Apply topically to rectum daily as needed for itching   No current facility-administered medications on file prior to visit.    Review of Systems  Constitutional:  Negative for activity change, appetite change, chills, diaphoresis, fatigue and fever.  HENT:  Negative for congestion and hearing loss.   Eyes:  Negative for visual disturbance.  Respiratory:  Negative for cough, chest tightness, shortness of breath and wheezing.   Cardiovascular:  Negative for chest pain, palpitations and leg swelling.  Gastrointestinal:  Negative for abdominal pain, constipation, diarrhea, nausea and vomiting.  Genitourinary:  Negative for dysuria, frequency and hematuria.  Musculoskeletal:  Negative for arthralgias and neck pain.  Skin:  Negative for rash.  Neurological:  Negative for dizziness, weakness, light-headedness, numbness and headaches.   Hematological:  Negative for adenopathy.  Psychiatric/Behavioral:  Negative for behavioral problems, dysphoric mood and sleep disturbance.    Per HPI unless specifically indicated above     Objective:    BP 102/70   Pulse 69   Ht 4\' 11"  (1.499 m)   Wt 99 lb 3.2 oz (45 kg)   LMP  (LMP Unknown)   SpO2 98%   BMI 20.04 kg/m   Wt Readings from Last 3 Encounters:  04/17/22 99 lb 3.2 oz (45 kg)  01/05/22 98 lb 2 oz (44.5 kg)  12/08/21 96 lb 12.8 oz (43.9 kg)    Physical Exam Vitals and nursing note reviewed.  Constitutional:      General: She is not in acute distress.    Appearance: She is well-developed. She is not diaphoretic.     Comments: Well-appearing, comfortable, cooperative  HENT:     Head: Normocephalic and atraumatic.  Eyes:     General:        Right eye: No discharge.        Left eye: No discharge.     Conjunctiva/sclera: Conjunctivae normal.     Pupils: Pupils are equal, round, and reactive to light.  Neck:     Thyroid: No thyromegaly.  Cardiovascular:     Rate and Rhythm: Normal rate and regular rhythm.     Pulses: Normal pulses.     Heart sounds: Normal heart sounds. No murmur heard. Pulmonary:     Effort: Pulmonary effort is normal. No respiratory distress.     Breath sounds: Normal breath sounds. No wheezing or rales.  Abdominal:     General: Bowel sounds are normal. There is no distension.     Palpations: Abdomen is soft. There is no mass.     Tenderness: There is no abdominal tenderness.  Musculoskeletal:        General: No tenderness. Normal range of motion.     Cervical back: Normal range of motion and neck supple.     Comments: Upper / Lower Extremities: - Normal muscle tone, strength bilateral upper extremities 5/5, lower extremities 5/5  Left wrist firm bony nodular ganglion cyst, non tender  Lymphadenopathy:     Cervical: No cervical adenopathy.  Skin:    General: Skin is warm and dry.     Findings: No erythema or rash.  Neurological:      Mental Status: She is alert and oriented to person, place, and time.     Comments: Distal sensation intact to light touch all extremities  Psychiatric:        Mood and Affect: Mood normal.        Behavior: Behavior normal.        Thought Content: Thought content normal.     Comments: Well groomed, good eye contact, normal speech and thoughts     Diabetic Foot Exam - Simple   Simple Foot Form Diabetic Foot exam was performed with the following findings: Yes 04/17/2022 10:23 AM  Visual Inspection No deformities, no ulcerations, no other skin breakdown bilaterally: Yes Sensation Testing Intact to touch and monofilament testing bilaterally: Yes Pulse Check Posterior Tibialis and Dorsalis pulse intact bilaterally: Yes Comments      Results for orders placed or performed in visit on 03/26/22  HM DIABETES EYE EXAM  Result Value Ref Range   HM Diabetic Eye Exam No Retinopathy No Retinopathy  Assessment & Plan:   Problem List Items Addressed This Visit     Anemia in chronic kidney disease (CKD)    Improved hemoglobin but still overall low Followed by Nephro, needs to re-schedule CKD-III underlying cause Will continue oral iron supplement daily, ADD VITAMIN C Check labs      Relevant Orders   CBC with Differential/Platelet   Iron, TIBC and Ferritin Panel   Anxiety associated with depression   Relevant Medications   escitalopram (LEXAPRO) 10 MG tablet   Benign hypertension with CKD (chronic kidney disease) stage III (HCC)    Controlled BP low readings - Home BP readings reviewed  Complication with CKDIII   Plan:  1. Continue current BP regimen Lisinopril 5mg  daily Remain OFF Atenolol - failed w side effect (was on for tremor)  2. Encourage improved lifestyle - low sodium diet, regular exercise 3. Continue monitor BP outside office, bring readings to next visit, if persistently >140/90 or new symptoms notify office sooner      Relevant Medications   lisinopril  (ZESTRIL) 5 MG tablet   simvastatin (ZOCOR) 40 MG tablet   Other Relevant Orders   COMPLETE METABOLIC PANEL WITH GFR   CBC with Differential/Platelet   Iron, TIBC and Ferritin Panel   Hyperlipidemia associated with type 2 diabetes mellitus (HCC)   Relevant Medications   lisinopril (ZESTRIL) 5 MG tablet   simvastatin (ZOCOR) 40 MG tablet   Other Relevant Orders   Lipid panel   Hypothyroidism   Relevant Medications   levothyroxine (SYNTHROID) 25 MCG tablet   Other Relevant Orders   TSH   T4, free   Type 2 diabetes with stage 3 chronic kidney disease GFR 30-59 (HCC)    Prior A1c controlled,due for labs Complications - at risk hypoglycemia, nephropathy with CKD III-IV, also in setting HTN - Contraindicated - SGLT2. H/o failed Glyburide, Januvia. Not interested in injectable - off januvia ineffective  Plan:  1. Remain off Sulfonylurea avoid hypoglycemia. On Metformin 500 TWICE A DAY 2. Cannot take ASA due to CKD, continue statin, on ACEi 3. Encouraged lifestyle improvements with DM diet, continue walking      Relevant Medications   lisinopril (ZESTRIL) 5 MG tablet   simvastatin (ZOCOR) 40 MG tablet   Other Relevant Orders   COMPLETE METABOLIC PANEL WITH GFR   Hemoglobin A1c   Other Visit Diagnoses     Annual physical exam    -  Primary   Relevant Orders   COMPLETE METABOLIC PANEL WITH GFR   CBC with Differential/Platelet   Vitamin B12 deficiency       Relevant Orders   Vitamin B12   Type 2 diabetes mellitus with stage 4 chronic kidney disease, without long-term current use of insulin (HCC)       Relevant Medications   lisinopril (ZESTRIL) 5 MG tablet   simvastatin (ZOCOR) 40 MG tablet       Updated Health Maintenance information Fasting labs ordered today Encouraged improvement to lifestyle with diet and exercise Goal of weight loss  Refilled medications  Recommend to return to Nephrology anytime, now in early 2024.  Also already scheduled w/ [facility]  12/2022, she can follow up with them to discuss her urinary dysfunction incontinence and other symptoms, sooner if needed  Orders Placed This Encounter  Procedures   COMPLETE METABOLIC PANEL WITH GFR   CBC with Differential/Platelet   Lipid panel    Order Specific Question:   Has the patient fasted?    Answer:  Yes   Hemoglobin A1c   TSH   T4, free   Vitamin B12   Iron, TIBC and Ferritin Panel     Meds ordered this encounter  Medications   escitalopram (LEXAPRO) 10 MG tablet    Sig: Take 1 tablet (10 mg total) by mouth daily. with food    Dispense:  90 tablet    Refill:  3   levothyroxine (SYNTHROID) 25 MCG tablet    Sig: TAKE 1 TABLET BY MOUTH ONCE DAILY ON AN EMPTY STOMACH. WAIT 30 MINUTES BEFORE TAKING OTHER MEDS.    Dispense:  90 tablet    Refill:  3   lisinopril (ZESTRIL) 5 MG tablet    Sig: Take 1 tablet (5 mg total) by mouth daily.    Dispense:  90 tablet    Refill:  3   simvastatin (ZOCOR) 40 MG tablet    Sig: Take 1 tablet (40 mg total) by mouth at bedtime.    Dispense:  90 tablet    Refill:  3      Follow up plan: Return in about 6 months (around 10/18/2022) for 6 month DM A1c Anemia CKD.  Nobie Putnam, [facility] 04/17/2022, 10:16 AM

## 2022-04-18 LAB — CBC WITH DIFFERENTIAL/PLATELET
Absolute Monocytes: 680 cells/uL (ref 200–950)
Basophils Absolute: 73 cells/uL (ref 0–200)
Basophils Relative: 0.9 %
Eosinophils Absolute: 170 cells/uL (ref 15–500)
Eosinophils Relative: 2.1 %
HCT: 31.4 % — ABNORMAL LOW (ref 35.0–45.0)
Hemoglobin: 10.6 g/dL — ABNORMAL LOW (ref 11.7–15.5)
Lymphs Abs: 2714 cells/uL (ref 850–3900)
MCH: 31.6 pg (ref 27.0–33.0)
MCHC: 33.8 g/dL (ref 32.0–36.0)
MCV: 93.7 fL (ref 80.0–100.0)
MPV: 9.2 fL (ref 7.5–12.5)
Monocytes Relative: 8.4 %
Neutro Abs: 4463 cells/uL (ref 1500–7800)
Neutrophils Relative %: 55.1 %
Platelets: 319 10*3/uL (ref 140–400)
RBC: 3.35 10*6/uL — ABNORMAL LOW (ref 3.80–5.10)
RDW: 12.3 % (ref 11.0–15.0)
Total Lymphocyte: 33.5 %
WBC: 8.1 10*3/uL (ref 3.8–10.8)

## 2022-04-18 LAB — COMPLETE METABOLIC PANEL WITH GFR
AG Ratio: 1.8 (calc) (ref 1.0–2.5)
ALT: 8 U/L (ref 6–29)
AST: 17 U/L (ref 10–35)
Albumin: 4.6 g/dL (ref 3.6–5.1)
Alkaline phosphatase (APISO): 40 U/L (ref 37–153)
BUN/Creatinine Ratio: 14 (calc) (ref 6–22)
BUN: 16 mg/dL (ref 7–25)
CO2: 28 mmol/L (ref 20–32)
Calcium: 10.5 mg/dL — ABNORMAL HIGH (ref 8.6–10.4)
Chloride: 96 mmol/L — ABNORMAL LOW (ref 98–110)
Creat: 1.15 mg/dL — ABNORMAL HIGH (ref 0.60–0.95)
Globulin: 2.5 g/dL (calc) (ref 1.9–3.7)
Glucose, Bld: 101 mg/dL — ABNORMAL HIGH (ref 65–99)
Potassium: 5.1 mmol/L (ref 3.5–5.3)
Sodium: 135 mmol/L (ref 135–146)
Total Bilirubin: 0.4 mg/dL (ref 0.2–1.2)
Total Protein: 7.1 g/dL (ref 6.1–8.1)
eGFR: 45 mL/min/{1.73_m2} — ABNORMAL LOW (ref >=60)

## 2022-04-18 LAB — IRON,TIBC AND FERRITIN PANEL
%SAT: 23 % (calc) (ref 16–45)
Ferritin: 97 ng/mL (ref 16–288)
Iron: 65 ug/dL (ref 45–160)
TIBC: 284 mcg/dL (calc) (ref 250–450)

## 2022-04-18 LAB — HEMOGLOBIN A1C
Hgb A1c MFr Bld: 6.8 % of total Hgb — ABNORMAL HIGH (ref <5.7)
Mean Plasma Glucose: 148 mg/dL
eAG (mmol/L): 8.2 mmol/L

## 2022-04-18 LAB — LIPID PANEL
Cholesterol: 167 mg/dL (ref <200)
HDL: 83 mg/dL (ref >=50)
LDL Cholesterol (Calc): 67 mg/dL (calc)
Non-HDL Cholesterol (Calc): 84 mg/dL (calc) (ref <130)
Total CHOL/HDL Ratio: 2 (calc) (ref <5.0)
Triglycerides: 89 mg/dL (ref <150)

## 2022-04-18 LAB — VITAMIN B12: Vitamin B-12: 1478 pg/mL — ABNORMAL HIGH (ref 200–1100)

## 2022-04-18 LAB — TSH: TSH: 8.38 mIU/L — ABNORMAL HIGH (ref 0.40–4.50)

## 2022-04-18 LAB — T4, FREE: Free T4: 1.1 ng/dL (ref 0.8–1.8)

## 2022-05-25 ENCOUNTER — Other Ambulatory Visit: Payer: Self-pay | Admitting: Family Medicine

## 2022-05-25 DIAGNOSIS — N1831 Chronic kidney disease, stage 3a: Secondary | ICD-10-CM

## 2022-05-25 DIAGNOSIS — I10 Essential (primary) hypertension: Secondary | ICD-10-CM

## 2022-05-26 NOTE — Telephone Encounter (Signed)
Unable to refill per protocol, Rx request is too soon. Last refill 08/25/21 for 90 and 3 refills.  Requested Prescriptions  Pending Prescriptions Disp Refills   amLODipine (NORVASC) 5 MG tablet [Pharmacy Med Name: AMLODIPINE BESYLATE 5 MG TAB] 90 tablet 3    Sig: TAKE 1 TABLET BY MOUTH ONCE DAILY     Cardiovascular: Calcium Channel Blockers 2 Passed - 05/25/2022  9:25 AM      Passed - Last BP in normal range    BP Readings from Last 1 Encounters:  04/17/22 102/70         Passed - Last Heart Rate in normal range    Pulse Readings from Last 1 Encounters:  04/17/22 69         Passed - Valid encounter within last 6 months    Recent Outpatient Visits           1 month ago Annual physical exam   [place] [place], Lisa Neat, DO   5 months ago Type 2 diabetes mellitus with stage 3a chronic kidney disease, without long-term current use of insulin (HCC)   [place] [facility] [place], Lisa Neat, DO   8 months ago S/p left hip fracture   [facility] [place], Lisa Neat, DO   1 year ago Type 2 diabetes mellitus with stage 3a chronic kidney disease, without long-term current use of insulin (HCC)   [place] [facility] [place], Lisa Neat, DO   1 year ago Vitamin B12 deficiency   [place] [facility] [place], Lisa Neat, DO       Future Appointments             In 5 months Althea Charon, Lisa Neat, DO [facility]   In 7 months MacDiarmid, [name], MD [facility] [place]             metFORMIN (GLUCOPHAGE) 500 MG tablet [Pharmacy Med Name: METFORMIN HCL 500 MG TAB] 180 tablet 3    Sig: TAKE 1 TABLET BY MOUTH TWICE DAILY WITH A MEAL     Endocrinology:  Diabetes - Biguanides Failed - 05/25/2022  9:25 AM      Failed - Cr in normal range and within 360 days    Creat  Date Value Ref Range Status  04/17/2022  1.15 (H) 0.60 - 0.95 mg/dL Final         Failed - eGFR in normal range and within 360 days    GFR, Est African American  Date Value Ref Range Status  09/06/2019 63 > OR = 60 mL/min/1.62m2 Final   GFR, Est Non African American  Date Value Ref Range Status  09/06/2019 54 (L) > OR = 60 mL/min/1.45m2 Final   GFR, Estimated  Date Value Ref Range Status  07/26/2021 49 (L) >60 mL/min Final    Comment:    (NOTE) Calculated using the CKD-EPI Creatinine Equation (2021)    eGFR  Date Value Ref Range Status  04/17/2022 45 (L) > OR = 60 mL/min/1.35m2 Final         Failed - B12 Level in normal range and within 720 days    Vitamin B-12  Date Value Ref Range Status  04/17/2022 1,478 (H) 200 - 1,100 pg/mL Final         Passed - HBA1C is between 0 and 7.9 and within 180 days    Hgb A1c MFr Bld  Date Value Ref Range Status  04/17/2022 6.8 (H) <5.7 % of total Hgb Final    Comment:    For someone without known diabetes, a hemoglobin A1c value of 6.5% or greater indicates that they may have  diabetes and this should be confirmed with a follow-up  test. . For someone with known diabetes, a value <7% indicates  that their diabetes is well controlled and a value  greater than or equal to 7% indicates suboptimal  control. A1c targets should be individualized based on  duration of diabetes, age, comorbid conditions, and  other considerations. . Currently, no consensus exists regarding use of hemoglobin A1c for diagnosis of diabetes for children. [name] - Valid encounter within last 6 months    Recent Outpatient Visits           1 month ago Annual physical exam   [place] [facility] [place], Lisa Neat, DO   5 months ago Type 2 diabetes mellitus with stage 3a chronic kidney disease, without long-term current use of insulin (HCC)   [place] [facility] [place], Lisa Neat, DO   8 months ago S/p left hip fracture   [facility] [name], DO   1 year ago Type 2 diabetes mellitus with stage 3a chronic kidney disease, without long-term current use of insulin (HCC)   [place] [facility] [place], Lisa Neat, DO   1 year ago Vitamin B12 deficiency   [place] [facility] [place], Lisa Neat, DO       Future Appointments             In 5 months Althea Charon, Lisa Neat, DO [facility]   In 7 months MacDiarmid, [name], MD [facility] [place]            Passed - CBC within normal limits and completed in the last 12 months    WBC  Date Value Ref Range Status  04/17/2022 8.1 3.8 - 10.8 Thousand/uL Final   RBC  Date Value Ref Range Status  04/17/2022 3.35 (L) 3.80 - 5.10 Million/uL Final   Hemoglobin  Date Value Ref Range Status  04/17/2022 10.6 (L) 11.7 - 15.5 g/dL Final   HGB  Date Value Ref Range Status  06/28/2012 9.7 (L) 12.0 - 16.0 g/dL Final   HCT  Date Value Ref Range Status  04/17/2022 31.4 (L) 35.0 - 45.0 % Final  06/28/2012 28.6 (L) 35.0 - 47.0 % Final   MCHC  Date Value Ref Range Status  04/17/2022 33.8 32.0 - 36.0 g/dL Final   [facility]  Date Value Ref Range Status  04/17/2022 31.6 27.0 - 33.0 pg Final   MCV  Date Value Ref Range Status  04/17/2022 93.7 80.0 - 100.0 fL Final  06/28/2012 92 80 - 100 fL Final   No results found for: "PLTCOUNTKUC", "LABPLAT", "POCPLA" RDW  Date Value Ref Range Status  04/17/2022 12.3 11.0 - 15.0 % Final  06/28/2012 13.6 11.5 - 14.5 % Final

## 2022-05-27 ENCOUNTER — Telehealth: Payer: Self-pay | Admitting: Family Medicine

## 2022-05-27 NOTE — Telephone Encounter (Signed)
Contacted Oswaldo Conroy to schedule their annual wellness visit. Appointment made for 06/12/2022.  Verlee Rossetti; Care Guide Ambulatory Clinical Support [place] [facility] Direct Dial: [phone]

## 2022-06-12 ENCOUNTER — Ambulatory Visit (INDEPENDENT_AMBULATORY_CARE_PROVIDER_SITE_OTHER): Payer: PPO

## 2022-06-12 VITALS — Ht 59.0 in | Wt 99.0 lb

## 2022-06-12 DIAGNOSIS — Z0000 Encounter for general adult medical examination without abnormal findings: Secondary | ICD-10-CM | POA: Diagnosis not present

## 2022-06-12 NOTE — Progress Notes (Signed)
I connected with  Lisa Mills on 06/12/22 by a audio enabled telemedicine application and verified that I am speaking with the correct person using two identifiers.  Patient Location: Home  Provider Location: Office/Clinic  I discussed the limitations of evaluation and management by telemedicine. The patient expressed understanding and agreed to proceed.  Subjective:   Lisa Mills is a 89 y.o. female who presents for Medicare Annual (Subsequent) preventive examination.  Review of Systems     Cardiac Risk Factors include: advanced age (>20men, >27 women);diabetes mellitus;hypertension     Objective:    There were no vitals filed for this visit. There is no height or weight on file to calculate BMI.     06/12/2022    1:31 PM 07/22/2021   12:18 AM 07/22/2021   12:00 AM 07/12/2021    8:45 PM 12/06/2020    2:56 PM 02/13/2020    7:13 AM 01/04/2020    7:25 PM  Advanced Directives  Does Patient Have a Medical Advance Directive? No  No No Yes Yes Yes  Type of Advance Directive     Living will Healthcare Power of [place];Living will Healthcare Power of [place];Living will  Does patient want to make changes to medical advance directive?     Yes (Inpatient - patient defers changing a medical advance directive and declines information at this time) No - Patient declined   Copy of Healthcare Power of Attorney in Chart?      No - copy requested No - copy requested  Would patient like information on creating a medical advance directive? No - Patient declined No - Patient declined  No - Patient declined       Current Medications (verified) Outpatient Encounter Medications as of 06/12/2022  Medication Sig   amLODipine (NORVASC) 5 MG tablet Take 1 tablet (5 mg total) by mouth daily.   Blood Glucose Monitoring Suppl (ONE TOUCH ULTRA 2) w/Device KIT Use to check blood sugar as advised up to 2 times daily   Cephalexin 250 MG tablet Take 1 tablet (250 mg total) by mouth daily in the  afternoon. UTI prevention.   Cholecalciferol (VITAMIN D) 50 MCG (2000 UT) CAPS Take 2,000 Units by mouth daily.    cyanocobalamin (VITAMIN B12) 1000 MCG tablet Take 1,000 mcg by mouth daily.   escitalopram (LEXAPRO) 10 MG tablet Take 1 tablet (10 mg total) by mouth daily. with food   feeding supplement (ENSURE ENLIVE / ENSURE PLUS) LIQD Take 237 mLs by mouth 2 (two) times daily between meals.   ferrous sulfate 325 (65 FE) MG tablet Take 325 mg by mouth daily.    glucose blood (ONE TOUCH ULTRA TEST) test strip CHECK BLOOD SUGAR UP TO 2 TIMES A DAY.   glycerin adult 2 g suppository Place 1 suppository rectally as needed for constipation.   Histamine Dihydrochloride (AUSTRALIAN DREAM ARTHRITIS) 0.025 % CREA Apply 1 application topically daily as needed (Pain).   levothyroxine (SYNTHROID) 25 MCG tablet TAKE 1 TABLET BY MOUTH ONCE DAILY ON AN EMPTY STOMACH. WAIT 30 MINUTES BEFORE TAKING OTHER MEDS.   lisinopril (ZESTRIL) 5 MG tablet Take 1 tablet (5 mg total) by mouth daily.   metFORMIN (GLUCOPHAGE) 500 MG tablet Take 1 tablet (500 mg total) by mouth 2 (two) times daily with a meal.   omeprazole (PRILOSEC) 20 MG capsule Take 1 capsule (20 mg total) by mouth daily before breakfast.   OneTouch Delica Lancets 33G MISC Use to check blood sugar up to 2 times daily  polyethylene glycol (MIRALAX / GLYCOLAX) 17 g packet Take 17 g by mouth daily as needed for moderate constipation or severe constipation. (Patient taking differently: Take 17 g by mouth daily as needed (constipation).)   senna (SENOKOT) 8.6 MG TABS tablet Take 1 tablet (8.6 mg total) by mouth daily.   simvastatin (ZOCOR) 40 MG tablet Take 1 tablet (40 mg total) by mouth at bedtime.   sodium chloride 1 g tablet Take 1 tablet (1 g total) by mouth 2 (two) times daily with a meal.   witch hazel-glycerin (TUCKS) pad 1 application  See admin instructions. Apply topically to rectum daily as needed for itching   No facility-administered encounter  medications on file as of 06/12/2022.    Allergies (verified) Aspirin, Conray [iothalamate], Dye fdc red [red dye], Sulfa antibiotics, Sulfasalazine, and Prednisone   History: Past Medical History:  Diagnosis Date   Anemia    Anxiety    Chronic kidney disease    stage 3-4   Constipation    Depression    Dermatophytosis of nail    Diabetes mellitus without complication (HCC)    Diet controlled, lost weight, no meds   GERD (gastroesophageal reflux disease)    Hemorrhoids    History of shingles    HLD (hyperlipidemia)    Hypertension    Hypothyroidism    Keratoderma    Restless legs syndrome (RLS)    hx   Past Surgical History:  Procedure Laterality Date   BREAST BIOPSY Right 09/30/2017   Affirm bx-calcs ( X clip),benign   BREAST EXCISIONAL BIOPSY Right    neg   cataracts     COLONOSCOPY W/ POLYPECTOMY     COLONOSCOPY WITH PROPOFOL N/A 02/10/2017   Procedure: COLONOSCOPY WITH PROPOFOL;  Surgeon: Scot Jun, MD;  Location: [facility] ENDOSCOPY;  Service: Endoscopy;  Laterality: N/A;   ESOPHAGOGASTRODUODENOSCOPY (EGD) WITH PROPOFOL N/A 02/10/2017   Procedure: ESOPHAGOGASTRODUODENOSCOPY (EGD) WITH PROPOFOL;  Surgeon: Scot Jun, MD;  Location: [facility] END[facility]COPY;  Service: Endoscopy;  Laterality: N/A;   EYE SURGERY     bilateral cataracts   HAND SURGERY     HARDWARE REMOVAL Left 07/23/2021   Procedure: HARDWARE REMOVAL;  Surgeon: Roby Lofts, MD;  Location: [facility] OR;  Service: Orthopedics;  Laterality: Left;   INTRAMEDULLARY (IM) NAIL INTERTROCHANTERIC Left 07/13/2021   Procedure: INTRAMEDULLARY (IM) NAIL INTERTROCHANTRIC;  Surgeon: Christena Flake, MD;  Location: [facility] ORS;  Service: Orthopedics;  Laterality: Left;   INTRAMEDULLARY (IM) NAIL INTERTROCHANTERIC Left 07/23/2021   Procedure: INTRAMEDULLARY (IM) NAIL INTERTROCHANTRIC;  Surgeon: Roby Lofts, MD;  Location: [facility] OR;  Service: Orthopedics;  Laterality: Left;   KIDNEY STONE SURGERY     ORIF FEMUR FRACTURE Left  07/23/2021   Procedure: OPEN REDUCTION INTERNAL FIXATION (ORIF) DISTAL FEMUR FRACTURE;  Surgeon: Roby Lofts, MD;  Location: [facility] OR;  Service: Orthopedics;  Laterality: Left;   ORIF HUMERUS FRACTURE Right 02/13/2020   Procedure: OPEN REDUCTION INTERNAL FIXATION (ORIF) SUPRACONDYLAR  HUMERUS FRACTURE;  Surgeon: Myrene Galas, MD;  Location: [facility] OR;  Service: Orthopedics;  Laterality: Right;   SACROPLASTY N/A 07/06/2019   Procedure: SACROPLASTY;  Surgeon: Kennedy Bucker, MD;  Location: [facility] ORS;  Service: Orthopedics;  Laterality: N/A;   TONSILECTOMY, ADENOIDECTOMY, BILATERAL MYRINGOTOMY AND TUBES     TONSILLECTOMY     TRIGGER FINGER RELEASE     Left ring finger   tubercular peritonitis     Family History  Adopted: Yes  Problem Relation Age of Onset   Breast  cancer Neg Hx    Social History   Socioeconomic History   Marital status: Widowed    Spouse name: Not on file   Number of children: 2   Years of education: Not on file   Highest education level: Some college, no degree  Occupational History   Not on file  Tobacco Use   Smoking status: Never   Smokeless tobacco: Never  Vaping Use   Vaping Use: Never used  Substance and Sexual Activity   Alcohol use: Not Currently   Drug use: No   Sexual activity: Not on file  Other Topics Concern   Not on file  Social History Narrative   Not on file   Social Determinants of Health   Financial Resource Strain: Low Risk  (06/12/2022)   Overall Financial Resource Strain (CARDIA)    Difficulty of Paying Living Expenses: Not hard at all  Food Insecurity: No Food Insecurity (06/12/2022)   Hunger Vital Sign    Worried About Running Out of Food in the Last Year: Never true    Ran Out of Food in the Last Year: Never true  Transportation Needs: No Transportation Needs (06/12/2022)   PRAPARE - Administrator, Civil Service (Medical): No    Lack of Transportation (Non-Medical): No  Physical Activity: Insufficiently Active (06/12/2022)    Exercise Vital Sign    Days of Exercise per Week: 2 days    Minutes of Exercise per Session: 20 min  Stress: No Stress Concern Present (06/12/2022)   [employer] of Occupational Health - Occupational Stress Questionnaire    Feeling of Stress : Not at all  Social Connections: Socially Isolated (06/12/2022)   Social Connection and Isolation Panel [NHANES]    Frequency of Communication with Friends and Family: More than three times a week    Frequency of Social Gatherings with Friends and Family: More than three times a week    Attends Religious Services: Never    Database administrator or Organizations: No    Attends Banker Meetings: Never    Marital Status: Widowed    Tobacco Counseling Counseling given: Not Answered   Clinical Intake:  Pre-visit preparation completed: Yes  Pain : No/denies pain     Nutritional Risks: None Diabetes: Yes CBG done?: No Did pt. bring in CBG monitor from home?: No  How often do you need to have someone help you when you read instructions, pamphlets, or other written materials from your doctor or pharmacy?: 1 - Never  Diabetic?yes Nutrition Risk Assessment:  Has the patient had any N/V/D within the last 2 months?  Yes  Does the patient have any non-healing wounds?  No  Has the patient had any unintentional weight loss or weight gain?  No   Diabetes:  Is the patient diabetic?  Yes  If diabetic, was a CBG obtained today?  No  Did the patient bring in their glucometer from home?  No  How often do you monitor your CBG's? Every morning.   Financial Strains and Diabetes Management:  Are you having any financial strains with the device, your supplies or your medication? No .  Does the patient want to be seen by Chronic Care Management for management of their diabetes?  No  Would the patient like to be referred to a Nutritionist or for Diabetic Management?  No   Diabetic Exams:  Diabetic Eye Exam: Completed 03/16/22. Pt  has been advised about the importance in completing this exam.  Diabetic Foot Exam: Completed 04/17/22. Pt has been advised about the importance in completing this exam.     Interpreter Needed?: No  Information entered by :: Kennedy Bucker, LPN   Activities of Daily Living    06/12/2022    1:33 PM 07/22/2021   12:00 AM  In your present state of health, do you have any difficulty performing the following activities:  Hearing? 0 1  Vision? 0 0  Difficulty concentrating or making decisions? 0 0  Walking or climbing stairs? 0 1  Dressing or bathing? 0 1  Doing errands, shopping? 0 1  Preparing Food and eating ? N   Using the Toilet? N   In the past six months, have you accidently leaked urine? N   Do you have problems with loss of bowel control? N   Managing your Medications? N   Managing your Finances? N   Housekeeping or managing your Housekeeping? N     Patient Care Team: [name], DO as PCP - General (Family Medicine) Eliezer Lofts, NP (Inactive) as Nurse Practitioner Marlowe Sax, RN as Case Manager (General Practice) Bridgett Larsson, LCSW as Social Worker (Licensed Clinical Social Worker)  Indicate any recent Medical Services you may have received from other than [facility] providers in the past year (date may be approximate).     Assessment:   This is a routine wellness examination for [facility].  Hearing/Vision screen Hearing Screening - Comments:: Wears aids Vision Screening - Comments:: No glasses- [facility]  Dietary issues and exercise activities discussed: Current Exercise Habits: Home exercise routine, Type of exercise: walking, Time (Minutes): 20, Frequency (Times/Week): 2, Weekly Exercise (Minutes/Week): 40, Intensity: Mild   Goals Addressed             This Visit's Progress    DIET - EAT MORE FRUITS AND VEGETABLES         Depression Screen    06/12/2022    1:30 PM 04/17/2022    1:03 PM 04/17/2022    1:02 PM 12/08/2021     3:41 PM 09/01/2021   11:47 AM 03/14/2021    1:29 PM 12/06/2020    2:51 PM  PHQ 2/9 Scores  PHQ - 2 Score 0 0 0 0 0 0 0  PHQ- 9 Score 0 0  1  0 0    Fall Risk    06/12/2022    1:33 PM 12/08/2021    3:41 PM 03/14/2021    1:29 PM 12/06/2020    2:57 PM 11/28/2019    4:06 PM  Fall Risk   Falls in the past year? 1 0 0 1 1  Number falls in past yr: 1 0 0 0 1  Injury with Fall? 1 0 0 1 1  Comment     cracked pelvis  Risk for fall due to : History of fall(s) Impaired balance/gait;Impaired mobility;History of fall(s) Impaired mobility History of fall(s);Impaired balance/gait Impaired balance/gait;Medication side effect  Follow up Falls evaluation completed;Falls prevention discussed Falls evaluation completed Falls evaluation completed Falls prevention discussed Falls evaluation completed;Education provided;Falls prevention discussed    FALL RISK PREVENTION PERTAINING TO THE HOME:  Any stairs in or around the home? Yes  If so, are there any without handrails? No  Home free of loose throw rugs in walkways, pet beds, electrical cords, etc? Yes  Adequate lighting in your home to reduce risk of falls? Yes   ASSISTIVE DEVICES UTILIZED TO PREVENT FALLS:  Life alert? No  Use of a  cane, walker or w/c? No  Grab bars in the bathroom? Yes  Shower chair or bench in shower? No  Elevated toilet seat or a handicapped toilet? Yes    Cognitive Function:        06/12/2022    1:38 PM 11/28/2019    4:10 PM 11/01/2018    1:55 PM 10/19/2017    2:55 PM  6CIT Screen  What Year? 0 points 0 points 0 points 0 points  What month? 0 points 0 points 0 points 0 points  What time? 0 points 0 points 0 points 0 points  Count back from 20 0 points 0 points 0 points 0 points  Months in reverse 0 points 0 points 0 points 0 points  Repeat phrase 0 points 2 points 0 points 0 points  Total Score 0 points 2 points 0 points 0 points    Immunizations Immunization History  Administered Date(s) Administered   Fluad  Quad(high Dose 65+) 11/01/2018, 10/25/2020, 12/08/2021   Influenza, High Dose Seasonal PF 10/28/2016, 10/19/2017, 12/19/2019   Influenza-Unspecified 10/17/2011, 11/09/2012, 10/30/2013, 11/02/2014, 11/10/2015   PFIZER Comirnaty(Gray Top)Covid-19 Tri-Sucrose Vaccine 03/26/2019, 04/19/2019, 12/19/2019   PFIZER(Purple Top)SARS-COV-2 Vaccination 03/26/2019, 04/19/2019, 12/19/2019   Pneumococcal Conjugate-13 12/10/2012   Pneumococcal Polysaccharide-23 08/25/2016   Zoster Recombinat (Shingrix) 12/11/2016, 08/28/2017, 08/28/2017    TDAP status: Due, Education has been provided regarding the importance of this vaccine. Advised may receive this vaccine at local pharmacy or Health Dept. Aware to provide a copy of the vaccination record if obtained from local pharmacy or Health Dept. Verbalized acceptance and understanding.  Flu Vaccine status: Up to date  Pneumococcal vaccine status: Up to date  Covid-19 vaccine status: Completed vaccines  Qualifies for Shingles Vaccine? Yes   Zostavax completed No   Shingrix Completed?: Yes  Screening Tests Health Maintenance  Topic Date Due   DTaP/Tdap/Td (1 - Tdap) Never done   COVID-19 Vaccine (7 - 2023-24 season) 09/19/2021   INFLUENZA VACCINE  08/20/2022   HEMOGLOBIN A1C  10/18/2022   OPHTHALMOLOGY EXAM  03/17/2023   FOOT EXAM  04/17/2023   Medicare Annual Wellness (AWV)  06/12/2023   Pneumonia Vaccine 56+ Years old  Completed   DEXA SCAN  Completed   Zoster Vaccines- Shingrix  Completed   HPV VACCINES  Aged Out    Health Maintenance  Health Maintenance Due  Topic Date Due   DTaP/Tdap/Td (1 - Tdap) Never done   COVID-19 Vaccine (7 - 2023-24 season) 09/19/2021    Colorectal cancer screening: No longer required.   Mammogram status: No longer required due to age.    Lung Cancer Screening: (Low Dose CT Chest recommended if Age 46-80 years, 30 pack-year currently smoking OR have quit w/in 15years.) does not qualify.     Additional  Screening:  Hepatitis C Screening: does not qualify; Completed no  Vision Screening: Recommended annual ophthalmology exams for early detection of glaucoma and other disorders of the eye. Is the patient up to date with their annual eye exam?  Yes  Who is the provider or what is the name of the office in which the patient attends annual eye exams? [facility] If pt is not established with a provider, would they like to be referred to a provider to establish care? No .   Dental Screening: Recommended annual dental exams for proper oral hygiene  Community Resource Referral / Chronic Care Management: CRR required this visit?  No   CCM required this visit?  No  Plan:     I have personally reviewed and noted the following in the patient's chart:   Medical and social history Use of alcohol, tobacco or illicit drugs  Current medications and supplements including opioid prescriptions. Patient is not currently taking opioid prescriptions. Functional ability and status Nutritional status Physical activity Advanced directives List of other physicians Hospitalizations, surgeries, and ER visits in previous 12 months Vitals Screenings to include cognitive, depression, and falls Referrals and appointments  In addition, I have reviewed and discussed with patient certain preventive protocols, quality metrics, and best practice recommendations. A written personalized care plan for preventive services as well as general preventive health recommendations were provided to patient.     Hal Hope, LPN   [id number]/61/0960   Nurse Notes: none

## 2022-06-12 NOTE — Patient Instructions (Signed)
Lisa Mills , Thank you for taking time to come for your Medicare Wellness Visit. I appreciate your ongoing commitment to your health goals. Please review the following plan we discussed and let me know if I can assist you in the future.   These are the goals we discussed:  Goals      CCM Expected Outcome:  Monitor, Self-Manage and Reduce Symptoms of Diabetes     CCM:  Monitor and Maintain HbA1c <8%     DIET - EAT MORE FRUITS AND VEGETABLES     DIET - INCREASE WATER INTAKE     Recommend drinking at least 6-8 glasses of water a day      Eat Healthy     Timeframe:  Short-Term Goal Priority:  Low Start Date:                             Expected End Date:                       Follow Up Date 12/06/21 - drink 6 to 8 glasses of water each day    Why is this important?   When you are ready to manage your nutrition or weight, having a plan and setting goals will help.  Taking small steps to change how you eat and exercise is a good place to start.    Notes:      Patient Stated     11/28/2019, wants to walk without cane while out     PharmD - Medication Managment     CARE PLAN ENTRY (see longitudinal plan of care for additional care plan information)   Current Barriers:  Chronic Disease Management support, education, and care coordination needs related to T2DM, CKD, HLD, hypothyroidism, anxiety and constipation  Pharmacist Clinical Goal(s):  Over the next 30 days, patient will work with CM Pharmacist to complete medication review and address needs identified  Interventions: Perform chart review Today Lisa Mills reports that she is doing well and is pleased that she is gaining some weight back Follow up with patient regarding constipation/iron supplement Reports that she has resumed taking the ferrous sulfate once daily with breakfast and that she is doing okay with tolerating this dose Reports continuing bowel regimen as directed by [facility]. Note patient has follow up  appointment with GI on 10/26 Follow up regarding insomina Reports feels like her insomina is improved with her being more active during the day Reports has trazodone to use as directed by PCP, but denies using recently as prefers not to take Provider and Inter-disciplinary care team collaboration (see longitudinal plan of care) Patient denies further medication question/concerns at this time  Patient Self Care Activities:  Attends all scheduled provider appointments Next appointment with [facility] on 10/26 Next appointment with Urology on 11/8  Please see past updates related to this goal by clicking on the "Past Updates" button in the selected goal       PharmD Goals     Please check your home blood pressure, keep a log of the results and bring this with you to your medical appointments.  Thank you!  Estelle Grumbles, PharmD, Patsy [place], CPP Clinical Pharmacist [facility] [place] [phone]      RNCM: Effective Management of Pain     Care Coordination Interventions: Reviewed provider established plan for pain management. Denies any pain today state PT sessions have completed  Discussed importance of adherence to all scheduled medical appointments. Urology appointment scheduled for 12/18  Counseled on the importance of reporting any/all new or changed pain symptoms or management strategies to pain management provider Advised patient to report to care team affect of pain on daily activities.  Medication review: Encouraged to notify this RNCM of any questions or difficulty to pay for meds Discussed use of relaxation techniques and/or diversional activities to assist with pain reduction (distraction, imagery, relaxation, massage, acupressure, TENS, heat, and cold application Reviewed with patient prescribed pharmacological and nonpharmacological pain relief strategies Advised patient to discuss unresolved pain, changes in level or intensity of  pain, new needs related to PT, changes in other chronic conditions with provider Report PT completed but she will have a nurse from [facility] to make one more visit to the home for reassessment of needs. Discussed ongoing use of DME, cane and/or walker, to decrease risk of falling        SW-Manage My Emotions     Timeframe:  Long-Range Goal Priority:  Medium Start Date:  02/01/20                         Expected End Date:  12/31//22                    Follow Up Date - 12/10/20   Patient Goals for the next 120 days: Continue adherence to treatment plan Schedule follow up appointment with PCP Continue utilizing support system to assist with management of symptoms     SW-Matintain My Quality of Life     Timeframe:  Long-Range Goal Priority:  Medium Start Date:  04/10/20                  Expected End Date: 01/18/21                  Follow Up Date - 12/10/20   Patient Goals for the next 120 days: Continue adherence to treatment plan Schedule follow up appointment with PCP Continue utilizing support system to assist with management of symptoms        This is a list of the screening recommended for you and due dates:  Health Maintenance  Topic Date Due   DTaP/Tdap/Td vaccine (1 - Tdap) Never done   COVID-19 Vaccine (7 - 2023-24 season) 09/19/2021   Flu Shot  08/20/2022   Hemoglobin A1C  10/18/2022   Eye exam for diabetics  03/17/2023   Complete foot exam   04/17/2023   Medicare Annual Wellness Visit  06/12/2023   Pneumonia Vaccine  Completed   DEXA scan (bone density measurement)  Completed   Zoster (Shingles) Vaccine  Completed   HPV Vaccine  Aged Out    Advanced directives: no  Conditions/risks identified: none  Next appointment: Follow up in one year for your annual wellness visit 06/18/23 @ 1:00 pm by phone   Preventive Care 65 Years and Older, Female Preventive care refers to lifestyle choices and visits with your health care provider that can promote health and  wellness. What does preventive care include? A yearly physical exam. This is also called an annual well check. Dental exams once or twice a year. Routine eye exams. Ask your health care provider how often you should have your eyes checked. Personal lifestyle choices, including: Daily care of your teeth and gums. Regular physical activity. Eating a healthy diet. Avoiding tobacco and drug use. Limiting alcohol use. Practicing safe  sex. Taking low-dose aspirin every day. Taking vitamin and mineral supplements as recommended by your health care provider. What happens during an annual well check? The services and screenings done by your health care provider during your annual well check will depend on your age, overall health, lifestyle risk factors, and family history of disease. Counseling  Your health care provider may ask you questions about your: Alcohol use. Tobacco use. Drug use. Emotional well-being. Home and relationship well-being. Sexual activity. Eating habits. History of falls. Memory and ability to understand (cognition). Work and work Astronomer. Reproductive health. Screening  You may have the following tests or measurements: Height, weight, and BMI. Blood pressure. Lipid and cholesterol levels. These may be checked every 5 years, or more frequently if you are over 13 years old. Skin check. Lung cancer screening. You may have this screening every year starting at age 81 if you have a 30-pack-year history of smoking and currently smoke or have quit within the past 15 years. Fecal occult blood test (FOBT) of the stool. You may have this test every year starting at age 73. Flexible sigmoidoscopy or colonoscopy. You may have a sigmoidoscopy every 5 years or a colonoscopy every 10 years starting at age 106. Hepatitis C blood test. Hepatitis B blood test. Sexually transmitted disease (STD) testing. Diabetes screening. This is done by checking your blood sugar (glucose)  after you have not eaten for a while (fasting). You may have this done every 1-3 years. Bone density scan. This is done to screen for osteoporosis. You may have this done starting at age 71. Mammogram. This may be done every 1-2 years. Talk to your health care provider about how often you should have regular mammograms. Talk with your health care provider about your test results, treatment options, and if necessary, the need for more tests. Vaccines  Your health care provider may recommend certain vaccines, such as: Influenza vaccine. This is recommended every year. Tetanus, diphtheria, and acellular pertussis (Tdap, Td) vaccine. You may need a Td booster every 10 years. Zoster vaccine. You may need this after age 27. Pneumococcal 13-valent conjugate (PCV13) vaccine. One dose is recommended after age 83. Pneumococcal polysaccharide (PPSV23) vaccine. One dose is recommended after age 11. Talk to your health care provider about which screenings and vaccines you need and how often you need them. This information is not intended to replace advice given to you by your health care provider. Make sure you discuss any questions you have with your health care provider. Document Released: 02/01/2015 Document Revised: 09/25/2015 Document Reviewed: 11/06/2014 Elsevier Interactive Patient Education  2017 [employer].  Fall Prevention in the Home Falls can cause injuries. They can happen to people of all ages. There are many things you can do to make your home safe and to help prevent falls. What can I do on the outside of my home? Regularly fix the edges of walkways and driveways and fix any cracks. Remove anything that might make you trip as you walk through a door, such as a raised step or threshold. Trim any bushes or trees on the path to your home. Use bright outdoor lighting. Clear any walking paths of anything that might make someone trip, such as rocks or tools. Regularly check to see if  handrails are loose or broken. Make sure that both sides of any steps have handrails. Any raised decks and porches should have guardrails on the edges. Have any leaves, snow, or ice cleared regularly. Use sand or salt on  walking paths during winter. Clean up any spills in your garage right away. This includes oil or grease spills. What can I do in the bathroom? Use night lights. Install grab bars by the toilet and in the tub and shower. Do not use towel bars as grab bars. Use non-skid mats or decals in the tub or shower. If you need to sit down in the shower, use a plastic, non-slip stool. Keep the floor dry. Clean up any water that spills on the floor as soon as it happens. Remove soap buildup in the tub or shower regularly. Attach bath mats securely with double-sided non-slip rug tape. Do not have throw rugs and other things on the floor that can make you trip. What can I do in the bedroom? Use night lights. Make sure that you have a light by your bed that is easy to reach. Do not use any sheets or blankets that are too big for your bed. They should not hang down onto the floor. Have a firm chair that has side arms. You can use this for support while you get dressed. Do not have throw rugs and other things on the floor that can make you trip. What can I do in the kitchen? Clean up any spills right away. Avoid walking on wet floors. Keep items that you use a lot in easy-to-reach places. If you need to reach something above you, use a strong step stool that has a grab bar. Keep electrical cords out of the way. Do not use floor polish or wax that makes floors slippery. If you must use wax, use non-skid floor wax. Do not have throw rugs and other things on the floor that can make you trip. What can I do with my stairs? Do not leave any items on the stairs. Make sure that there are handrails on both sides of the stairs and use them. Fix handrails that are broken or loose. Make sure that  handrails are as long as the stairways. Check any carpeting to make sure that it is firmly attached to the stairs. Fix any carpet that is loose or worn. Avoid having throw rugs at the top or bottom of the stairs. If you do have throw rugs, attach them to the floor with carpet tape. Make sure that you have a light switch at the top of the stairs and the bottom of the stairs. If you do not have them, ask someone to add them for you. What else can I do to help prevent falls? Wear shoes that: Do not have high heels. Have rubber bottoms. Are comfortable and fit you well. Are closed at the toe. Do not wear sandals. If you use a stepladder: Make sure that it is fully opened. Do not climb a closed stepladder. Make sure that both sides of the stepladder are locked into place. Ask someone to hold it for you, if possible. Clearly mark and make sure that you can see: Any grab bars or handrails. First and last steps. Where the edge of each step is. Use tools that help you move around (mobility aids) if they are needed. These include: Canes. Walkers. Scooters. Crutches. Turn on the lights when you go into a dark area. Replace any light bulbs as soon as they burn out. Set up your furniture so you have a clear path. Avoid moving your furniture around. If any of your floors are uneven, fix them. If there are any pets around you, be aware of where they are.  Review your medicines with your doctor. Some medicines can make you feel dizzy. This can increase your chance of falling. Ask your doctor what other things that you can do to help prevent falls. This information is not intended to replace advice given to you by your health care provider. Make sure you discuss any questions you have with your health care provider. Document Released: 11/01/2008 Document Revised: 06/13/2015 Document Reviewed: 02/09/2014 Elsevier Interactive Patient Education  2017 [employer].

## 2022-08-15 ENCOUNTER — Other Ambulatory Visit: Payer: Self-pay | Admitting: Family Medicine

## 2022-08-15 DIAGNOSIS — I10 Essential (primary) hypertension: Secondary | ICD-10-CM

## 2022-08-15 DIAGNOSIS — N1831 Chronic kidney disease, stage 3a: Secondary | ICD-10-CM

## 2022-08-17 NOTE — Telephone Encounter (Signed)
Requested Prescriptions  Pending Prescriptions Disp Refills   metFORMIN (GLUCOPHAGE) 500 MG tablet [Pharmacy Med Name: METFORMIN HCL 500 MG TAB] 180 tablet 0    Sig: TAKE 1 TABLET BY MOUTH TWICE DAILY WITH A MEAL     Endocrinology:  Diabetes - Biguanides Failed - 08/15/2022  2:29 PM      Failed - Cr in normal range and within 360 days    Creat  Date Value Ref Range Status  04/17/2022 1.15 (H) 0.60 - 0.95 mg/dL Final         Failed - eGFR in normal range and within 360 days    GFR, Est African American  Date Value Ref Range Status  09/06/2019 63 > OR = 60 mL/min/1.31m2 Final   GFR, Est Non African American  Date Value Ref Range Status  09/06/2019 54 (L) > OR = 60 mL/min/1.69m2 Final   GFR, Estimated  Date Value Ref Range Status  07/26/2021 49 (L) >60 mL/min Final    Comment:    (NOTE) Calculated using the CKD-EPI Creatinine Equation (2021)    eGFR  Date Value Ref Range Status  04/17/2022 45 (L) > OR = 60 mL/min/1.58m2 Final         Failed - B12 Level in normal range and within 720 days    Vitamin B-12  Date Value Ref Range Status  04/17/2022 1,478 (H) 200 - 1,100 pg/mL Final         Passed - HBA1C is between 0 and 7.9 and within 180 days    Hgb A1c MFr Bld  Date Value Ref Range Status  04/17/2022 6.8 (H) <5.7 % of total Hgb Final    Comment:    For someone without known diabetes, a hemoglobin A1c value of 6.5% or greater indicates that they may have  diabetes and this should be confirmed with a follow-up  test. . For someone with known diabetes, a value <7% indicates  that their diabetes is well controlled and a value  greater than or equal to 7% indicates suboptimal  control. A1c targets should be individualized based on  duration of diabetes, age, comorbid conditions, and  other considerations. . Currently, no consensus exists regarding use of hemoglobin A1c for diagnosis of diabetes for children. [name] - Valid encounter within last 6 months     Recent Outpatient Visits           4 months ago Annual physical exam   [place] [facility], [name], DO   8 months ago Type 2 diabetes mellitus with stage 3a chronic kidney disease, without long-term current use of insulin [facility])   [place] [facility] [name], [place]   11 months ago S/p left hip fracture   [place] [facility] [name], DO   1 year ago Type 2 diabetes mellitus with stage 3a chronic kidney disease, without long-term current use of insulin [facility])   [place] [facility] [name], DO   1 year ago Vitamin B12 deficiency   [place] [facility] [name], DO       Future Appointments             In 2 months Althea Charon, [name], DO [place] [facility]   In 4 months MacDiarmid, [name], MD [facility]  Passed - CBC within normal limits and completed in the last 12 months    WBC  Date Value Ref Range Status  04/17/2022 8.1 3.8 - 10.8 Thousand/uL Final   RBC  Date Value Ref Range Status  04/17/2022 3.35 (L) 3.80 - 5.10 Million/uL Final   Hemoglobin  Date Value Ref Range Status  04/17/2022 10.6 (L) 11.7 - 15.5 g/dL Final   HGB  Date Value Ref Range Status  06/28/2012 9.7 (L) 12.0 - 16.0 g/dL Final   HCT  Date Value Ref Range Status  04/17/2022 31.4 (L) 35.0 - 45.0 % Final  06/28/2012 28.6 (L) 35.0 - 47.0 % Final   MCHC  Date Value Ref Range Status  04/17/2022 33.8 32.0 - 36.0 g/dL Final   [facility]  Date Value Ref Range Status  04/17/2022 31.6 27.0 - 33.0 pg Final   MCV  Date Value Ref Range Status  04/17/2022 93.7 80.0 - 100.0 fL Final  06/28/2012 92 80 - 100 fL Final   No results found for: "PLTCOUNTKUC", "LABPLAT", "POCPLA" RDW  Date Value Ref Range Status  04/17/2022 12.3 11.0 - 15.0 % Final  06/28/2012 13.6 11.5 - 14.5 %  Final          amLODipine (NORVASC) 5 MG tablet [Pharmacy Med Name: AMLODIPINE BESYLATE 5 MG TAB] 90 tablet 0    Sig: TAKE 1 TABLET BY MOUTH ONCE DAILY     Cardiovascular: Calcium Channel Blockers 2 Passed - 08/15/2022  2:29 PM      Passed - Last BP in normal range    BP Readings from Last 1 Encounters:  04/17/22 102/70         Passed - Last Heart Rate in normal range    Pulse Readings from Last 1 Encounters:  04/17/22 69         Passed - Valid encounter within last 6 months    Recent Outpatient Visits           4 months ago Annual physical exam   [place] [facility] [name], DO   8 months ago Type 2 diabetes mellitus with stage 3a chronic kidney disease, without long-term current use of insulin [facility])   [place] [facility] [name], DO   11 months ago S/p left hip fracture   [place] [facility] [name], DO   1 year ago Type 2 diabetes mellitus with stage 3a chronic kidney disease, without long-term current use of insulin [facility])   [place] [facility] [name], DO   1 year ago Vitamin B12 deficiency   [place] [facility] [name], DO       Future Appointments             In 2 months Althea Charon, [name], DO [place] [facility]   In 4 months MacDiarmid, [name], MD [facility]

## 2022-10-16 ENCOUNTER — Ambulatory Visit: Payer: PPO | Admitting: Family Medicine

## 2022-10-23 ENCOUNTER — Ambulatory Visit: Payer: PPO | Admitting: Family Medicine

## 2022-10-26 DIAGNOSIS — N281 Cyst of kidney, acquired: Secondary | ICD-10-CM | POA: Diagnosis not present

## 2022-10-26 DIAGNOSIS — E871 Hypo-osmolality and hyponatremia: Secondary | ICD-10-CM | POA: Diagnosis not present

## 2022-10-26 DIAGNOSIS — E1122 Type 2 diabetes mellitus with diabetic chronic kidney disease: Secondary | ICD-10-CM | POA: Diagnosis not present

## 2022-10-26 DIAGNOSIS — N1831 Chronic kidney disease, stage 3a: Secondary | ICD-10-CM | POA: Diagnosis not present

## 2022-11-09 ENCOUNTER — Ambulatory Visit: Payer: PPO | Admitting: Family Medicine

## 2022-11-09 ENCOUNTER — Encounter: Payer: Self-pay | Admitting: Family Medicine

## 2022-11-09 VITALS — BP 128/70 | HR 77 | Ht 59.0 in | Wt 106.8 lb

## 2022-11-09 DIAGNOSIS — N1831 Chronic kidney disease, stage 3a: Secondary | ICD-10-CM

## 2022-11-09 DIAGNOSIS — E063 Autoimmune thyroiditis: Secondary | ICD-10-CM | POA: Diagnosis not present

## 2022-11-09 DIAGNOSIS — Z23 Encounter for immunization: Secondary | ICD-10-CM

## 2022-11-09 DIAGNOSIS — I129 Hypertensive chronic kidney disease with stage 1 through stage 4 chronic kidney disease, or unspecified chronic kidney disease: Secondary | ICD-10-CM | POA: Diagnosis not present

## 2022-11-09 DIAGNOSIS — N183 Chronic kidney disease, stage 3 unspecified: Secondary | ICD-10-CM

## 2022-11-09 DIAGNOSIS — H6123 Impacted cerumen, bilateral: Secondary | ICD-10-CM | POA: Diagnosis not present

## 2022-11-09 DIAGNOSIS — E1122 Type 2 diabetes mellitus with diabetic chronic kidney disease: Secondary | ICD-10-CM | POA: Diagnosis not present

## 2022-11-09 LAB — POCT GLYCOSYLATED HEMOGLOBIN (HGB A1C): Hemoglobin A1C: 6.9 % — AB (ref 4.0–5.6)

## 2022-11-09 NOTE — Progress Notes (Signed)
Subjective:    Patient ID: Lisa Mills, female    DOB: 30-Jun-1929, 89 y.o.   MRN: [id number]  Lisa Mills is a 89 y.o. female presenting on 11/09/2022 for Diabetes, Anemia, and Chronic Kidney Disease   HPI  Here with son for additional history  Discussed the use of AI scribe software for clinical note transcription with the patient, who gave verbal consent to proceed.     The patient, with a history of kidney disease, presented for a six-month check-up.   The patient has been managing iron deficiency, which is believed to be related to the kidney disease. She has been taking one iron pill daily, although the nephrologist initially recommended two. The patient expressed willingness to increase the dosage if it would resolve the issue, but was advised that the iron supplementation is more of a management strategy rather than a solution to the underlying problem.  The patient has been making dietary adjustments to manage her iron levels, including consuming cereal with oat milk, which contains calcium. However, after learning about hypercalcemia, she switched to a coffee creamer without calcium for the past few days.  The patient also reported issues with hearing and requested ear cleaning. She speculated that the earwax buildup might be affecting her hearing and expressed interest in getting hearing aids if necessary.       Type 2 Diabetes CKD IIIa Anemia CKD Bilateral Renal Cysts multiple HTN Followed by Dr Thedore Mins in past for CKD >5+ years ago Last labs done 10/26/22 per Nephrology GFR improved on last lab. Calcium 10 mild elevated. Related to renal disease She questions the history of renal cysts, it was on prior CT 2021  Hemoglobin 10.1, stable to improved On 1 iron pill per day (could not tolerate 2) apt 11/11/22 Dr Cathie Hoops with history Anemia, CKD  On Lisinopril 5mg , Amlodipine 5mg  daily On Metformin 500mg  TWICE A DAY, doing well with this No hypoglycemia    UTI  Prophylaxis Followed by Urology Dr Zorita Pang on Cephalexin 250mg  daily has plenty of refills   Chronic Left Hip problem S/p Left Hip Fracture Improving mobility overall. Using cane   Right hand problem with numbness tingling, loss of function.  Hearing Loss Cerumen impacted. Request flushing.  Health Maintenance: Flu Shot     11/09/2022    1:45 PM 06/12/2022    1:30 PM 04/17/2022    1:03 PM  Depression screen PHQ 2/9  Decreased Interest 0 0 0  Down, Depressed, Hopeless 0 0 0  PHQ - 2 Score 0 0 0  Altered sleeping 0 0 0  Tired, decreased energy 0 0 0  Change in appetite 0 0 0  Feeling bad or failure about yourself  0 0 0  Trouble concentrating 0 0 0  Moving slowly or fidgety/restless 0 0 0  Suicidal thoughts 0 0 0  PHQ-9 Score 0 0 0  Difficult doing work/chores  Not difficult at all Not difficult at all    Social History   Tobacco Use   Smoking status: Never   Smokeless tobacco: Never  Vaping Use   Vaping status: Never Used  Substance Use Topics   Alcohol use: Not Currently   Drug use: No    Review of Systems  HENT:  Positive for hearing loss.    Per HPI unless specifically indicated above     Objective:    BP 128/70   Pulse 77   Ht 4\' 11"  (1.499 m)   Wt 106 lb  12.8 oz (48.4 kg)   LMP  (LMP Unknown)   SpO2 95%   BMI 21.57 kg/m   Wt Readings from Last 3 Encounters:  11/09/22 106 lb 12.8 oz (48.4 kg)  06/12/22 99 lb (44.9 kg)  04/17/22 99 lb 3.2 oz (45 kg)    Physical Exam Vitals and nursing note reviewed.  Constitutional:      General: She is not in acute distress.    Appearance: She is well-developed. She is not diaphoretic.     Comments: Well-appearing elderly 89 yr female, comfortable, cooperative  HENT:     Head: Normocephalic and atraumatic.     Right Ear: There is impacted cerumen.     Left Ear: There is impacted cerumen.  Eyes:     General:        Right eye: No discharge.        Left eye: No discharge.      Conjunctiva/sclera: Conjunctivae normal.  Neck:     Thyroid: No thyromegaly.  Cardiovascular:     Rate and Rhythm: Normal rate and regular rhythm.     Heart sounds: Normal heart sounds. No murmur heard. Pulmonary:     Effort: Pulmonary effort is normal. No respiratory distress.     Breath sounds: Normal breath sounds. No wheezing or rales.  Musculoskeletal:        General: Normal range of motion.     Cervical back: Normal range of motion and neck supple.     Right lower leg: No edema.     Left lower leg: No edema.  Lymphadenopathy:     Cervical: No cervical adenopathy.  Skin:    General: Skin is warm and dry.     Findings: No erythema or rash.  Neurological:     Mental Status: She is alert and oriented to person, place, and time.  Psychiatric:        Behavior: Behavior normal.     Comments: Well groomed, good eye contact, normal speech and thoughts     ________________________________________________________ PROCEDURE NOTE Date: 11/09/22 BilateralEar Lavage / Cerumen Removal Discussed benefits and risks (including pain / discomforts, dizziness, minor abrasion of ear canal). Verbal consent given by patient. Medication:  carbamide peroxide ear drops, Ear Lavage Solution (warm water + hydrogen peroxide) Performed by Dr Althea Charon + Shirley Muscat CMA Several drops of carbamide peroxide placed in each ear, allowed to sit for few minutes. Ear lavage solution flushed into one ear at a time in attempt to dislodge and remove ear wax. Results successful  Repeat Ear Exam: - Completely removed cerumen now, with clear ear canals and visible TMs clear and normal appearance.   ---------------------  I have personally reviewed the radiology report from 10/24/19 on CT Abd Pelvis.  CLINICAL DATA:  Constipation, anemia, recent UTI and hematuria   EXAM: CT ABDOMEN AND PELVIS WITHOUT CONTRAST   TECHNIQUE: Multidetector CT imaging of the abdomen and pelvis was performed following the  standard protocol without IV contrast.   COMPARISON:  CT pelvis dated 07/04/2019   FINDINGS: Lower chest: Lung bases are clear.   Hepatobiliary: Unenhanced liver is grossly unremarkable.   Gallbladder is unremarkable. No intrahepatic or extrahepatic ductal dilatation.   Pancreas: Fatty parenchymal replacement of the pancreas.   Spleen: Within normal limits.   Adrenals/Urinary Tract: Adrenal glands are within normal limits.   Bilateral renal cysts, including a dominant 5.5 cm cyst in the anterior right upper kidney (series 2/image 26). Punctate nonobstructing left upper pole renal calculus (series 2/image  28). No ureteral or bladder calculi. No hydronephrosis.   Bladder is grossly unremarkable.   Stomach/Bowel: Stomach is notable for a large hiatal hernia.   No evidence of bowel obstruction.   Appendix is not discretely visualized.   No colonic wall thickening or mass is seen.   Vascular/Lymphatic: No evidence of abdominal aortic aneurysm.   Atherosclerotic calcifications of the abdominal aorta and branch vessels.   No suspicious abdominopelvic lymphadenopathy.   Reproductive: Uterus and bilateral ovaries are unremarkable.   Other: No abdominopelvic ascites.   Musculoskeletal: Degenerative changes of the visualized thoracolumbar spine.   IMPRESSION: Punctate nonobstructing left upper pole renal calculus. No ureteral or bladder calculi. No hydronephrosis.   Bilateral renal cysts, measuring up to 5.5 cm in the right upper kidney, benign.   Large hiatal hernia.     Electronically Signed   By: Charline Bills M.D.   On: 10/24/2019 15:28  Results for orders placed or performed in visit on 11/09/22  POCT glycosylated hemoglobin (Hb A1C)  Result Value Ref Range   Hemoglobin A1C 6.9 (A) 4.0 - 5.6 %      Assessment & Plan:   Problem List Items Addressed This Visit     Benign hypertension with CKD (chronic kidney disease) stage III (HCC)   CKD stage 3  due to type 2 diabetes mellitus (HCC)   Hypothyroidism   Type 2 diabetes with stage 3 chronic kidney disease GFR 30-59 (HCC) - Primary    A1c controlled 6.9 Complications - at risk hypoglycemia, nephropathy with CKD III-IV, also in setting HTN - Contraindicated - SGLT2. H/o failed Glyburide, Januvia. Not interested in injectable - off januvia ineffective  Plan:  1. Remain off Sulfonylurea avoid hypoglycemia. On Metformin 500 TWICE A DAY 2. Cannot take ASA due to CKD, continue statin, on ACEi 3. Encouraged lifestyle improvements with DM diet, continue walking      Relevant Orders   POCT glycosylated hemoglobin (Hb A1C) (Completed)   Other Visit Diagnoses     Need for influenza vaccination       Relevant Orders   Flu Vaccine Trivalent High Dose (Fluad) (Completed)   Bilateral hearing loss due to cerumen impaction           Assessment and Plan    Iron Deficiency Anemia Likely secondary to chronic kidney disease. Hematology consult scheduled with Dr. Cathie Hoops for further evaluation this week -Continue iron 1 tablet daily. -Encourage dietary intake of iron.  Chronic Kidney Disease IIIa Multiple renal cysts noted on previous imaging. Recent improvement in kidney function on lab. Hypercalcemia noted. -Continue current management as per nephrologist Dr. Thedore Mins. -Encourage dietary intake of calcium.  Impaired Hearing Likely secondary to cerumen impaction. -Performed bilateral ear irrigation and cerumen removal. Improved  General Health Maintenance -Administer influenza vaccine today. -Check blood glucose today (last 6.8, today 6.9). -Schedule follow-up visit in 6 months.         Orders Placed This Encounter  Procedures   Flu Vaccine Trivalent High Dose (Fluad)   POCT glycosylated hemoglobin (Hb A1C)     No orders of the defined types were placed in this encounter.     Follow up plan: Return in about 6 months (around 05/10/2023) for 6 month fasting lab only then 1  week later Annual Physical.  Saralyn Pilar, DO [facility] 11/09/2022, 2:02 PM

## 2022-11-09 NOTE — Assessment & Plan Note (Signed)
A1c controlled 6.9 Complications - at risk hypoglycemia, nephropathy with CKD III-IV, also in setting HTN - Contraindicated - SGLT2. H/o failed Glyburide, Januvia. Not interested in injectable - off januvia ineffective  Plan:  1. Remain off Sulfonylurea avoid hypoglycemia. On Metformin 500 TWICE A DAY 2. Cannot take ASA due to CKD, continue statin, on ACEi 3. Encouraged lifestyle improvements with DM diet, continue walking

## 2022-11-09 NOTE — Patient Instructions (Addendum)
Thank you for coming to the office today.  Ear wax removed today  Recent Labs    12/08/21 1553 04/17/22 1032 11/09/22 1419  HGBA1C 6.2* 6.8* 6.9*   Kidney cysts are not a problem. They have been present for years.  Keep up with iron anemia doctor later this week on Weds  DUE for FASTING BLOOD WORK (no food or drink after midnight before the lab appointment, only water or coffee without cream/sugar on the morning of)  SCHEDULE "Lab Only" visit in the morning at the clinic for lab draw in 6 MONTHS   - Make sure Lab Only appointment is at about 1 week before your next appointment, so that results will be available  For Lab Results, once available within 2-3 days of blood draw, you can can log in to MyChart online to view your results and a brief explanation. Also, we can discuss results at next follow-up visit.   Please schedule a Follow-up Appointment to: Return in about 6 months (around 05/10/2023) for 6 month fasting lab only then 1 week later Annual Physical.  If you have any other questions or concerns, please feel free to call the office or send a message through MyChart. You may also schedule an earlier appointment if necessary.  Additionally, you may be receiving a survey about your experience at our office within a few days to 1 week by e-mail or mail. We value your feedback.  Saralyn Pilar, DO [facility], [place]

## 2022-11-11 ENCOUNTER — Inpatient Hospital Stay: Payer: PPO

## 2022-11-11 ENCOUNTER — Encounter: Payer: Self-pay | Admitting: Oncology

## 2022-11-11 ENCOUNTER — Inpatient Hospital Stay: Payer: PPO | Attending: Oncology | Admitting: Oncology

## 2022-11-11 VITALS — BP 156/66 | HR 82 | Temp 97.6°F | Resp 18 | Wt 107.1 lb

## 2022-11-11 DIAGNOSIS — D631 Anemia in chronic kidney disease: Secondary | ICD-10-CM | POA: Insufficient documentation

## 2022-11-11 DIAGNOSIS — R768 Other specified abnormal immunological findings in serum: Secondary | ICD-10-CM | POA: Diagnosis not present

## 2022-11-11 DIAGNOSIS — N184 Chronic kidney disease, stage 4 (severe): Secondary | ICD-10-CM

## 2022-11-11 DIAGNOSIS — N183 Chronic kidney disease, stage 3 unspecified: Secondary | ICD-10-CM | POA: Insufficient documentation

## 2022-11-11 LAB — RETIC PANEL
Immature Retic Fract: 6.9 % (ref 2.3–15.9)
RBC.: 2.98 MIL/uL — ABNORMAL LOW (ref 3.87–5.11)
Retic Count, Absolute: 27.4 10*3/uL (ref 19.0–186.0)
Retic Ct Pct: 0.9 % (ref 0.4–3.1)
Reticulocyte Hemoglobin: 32.5 pg (ref >27.9)

## 2022-11-11 LAB — CBC WITH DIFFERENTIAL/PLATELET
Abs Immature Granulocytes: 0.02 10*3/uL (ref 0.00–0.07)
Basophils Absolute: 0 10*3/uL (ref 0.0–0.1)
Basophils Relative: 1 %
Eosinophils Absolute: 0.2 10*3/uL (ref 0.0–0.5)
Eosinophils Relative: 3 %
HCT: 28.2 % — ABNORMAL LOW (ref 36.0–46.0)
Hemoglobin: 9.4 g/dL — ABNORMAL LOW (ref 12.0–15.0)
Immature Granulocytes: 0 %
Lymphocytes Relative: 32 %
Lymphs Abs: 2.4 10*3/uL (ref 0.7–4.0)
MCH: 32.1 pg (ref 26.0–34.0)
MCHC: 33.3 g/dL (ref 30.0–36.0)
MCV: 96.2 fL (ref 80.0–100.0)
Monocytes Absolute: 0.6 10*3/uL (ref 0.1–1.0)
Monocytes Relative: 8 %
Neutro Abs: 4.1 10*3/uL (ref 1.7–7.7)
Neutrophils Relative %: 56 %
Platelets: 279 10*3/uL (ref 150–400)
RBC: 2.93 MIL/uL — ABNORMAL LOW (ref 3.87–5.11)
RDW: 12.6 % (ref 11.5–15.5)
WBC: 7.4 10*3/uL (ref 4.0–10.5)
nRBC: 0 % (ref 0.0–0.2)

## 2022-11-11 LAB — CMP (CANCER CENTER ONLY)
ALT: 15 U/L (ref 0–44)
AST: 22 U/L (ref 15–41)
Albumin: 4.2 g/dL (ref 3.5–5.0)
Alkaline Phosphatase: 38 U/L (ref 38–126)
Anion gap: 9 (ref 5–15)
BUN: 17 mg/dL (ref 8–23)
CO2: 23 mmol/L (ref 22–32)
Calcium: 9.3 mg/dL (ref 8.9–10.3)
Chloride: 101 mmol/L (ref 98–111)
Creatinine: 1.21 mg/dL — ABNORMAL HIGH (ref 0.44–1.00)
GFR, Estimated: 42 mL/min — ABNORMAL LOW (ref >60)
Glucose, Bld: 158 mg/dL — ABNORMAL HIGH (ref 70–99)
Potassium: 4.3 mmol/L (ref 3.5–5.1)
Sodium: 133 mmol/L — ABNORMAL LOW (ref 135–145)
Total Bilirubin: 0.3 mg/dL (ref 0.3–1.2)
Total Protein: 7.3 g/dL (ref 6.5–8.1)

## 2022-11-11 NOTE — Progress Notes (Signed)
Hematology/Oncology Consult note [facility] [phone] [phone]   Patient Care Team: [name], DO as PCP - General (Family Medicine) Eliezer Lofts, NP (Inactive) as Nurse Practitioner Marlowe Sax, RN as Case Manager (General Practice) Bridgett Larsson, LCSW as Social Worker (Licensed Clinical Social Worker) Rickard Patience, MD as Consulting Physician (Oncology)  REFERRING PROVIDER: Mosetta Pigeon, MD  CHIEF COMPLAINTS/REASON FOR VISIT:  Evaluation of abnormal SPEP  ASSESSMENT & PLAN:   Elevated serum immunoglobulin free light chains Mild elevated light chain ratio.  -This is nonspecific can be seen in patient with chronic kidney disease. Recommend to check 24-hour urine protein electrophoresis, repeat multiple myeloma panel, check CBC and CMP.  Anemia in chronic kidney disease (CKD) Anemia is likely due to chronic kidney disease.   Iron panel is not consistent with iron deficiency.  I recommend patient continue oral iron supplementation which may help stabilizing her hemoglobin.   Orders Placed This Encounter  Procedures   IFE+PROTEIN ELECTRO, 24-HR UR    Standing Status:   Future    Standing Expiration Date:   11/11/2023   CBC with Differential/Platelet    Standing Status:   Future    Number of Occurrences:   1    Standing Expiration Date:   11/11/2023   Retic Panel    Standing Status:   Future    Number of Occurrences:   1    Standing Expiration Date:   11/11/2023   Multiple Myeloma Panel (SPEP&IFE w/QIG)    Standing Status:   Future    Number of Occurrences:   1    Standing Expiration Date:   11/11/2023   CMP (Cancer Center only)    Standing Status:   Future    Number of Occurrences:   1    Standing Expiration Date:   11/11/2023   Follow-up as needed. All questions were answered. The patient knows to call the clinic with any problems, questions or concerns.  Rickard Patience, MD, PhD [facility]  Hematology Oncology 11/11/2022    HISTORY OF PRESENTING ILLNESS:  Lisa Mills is a 89 y.o. female who was seen in consultation at the request of Mosetta Pigeon, MD for evaluation of abnormal light chain.   Previous labs were reviewed 10/26/2022 SPEP showed no M spike,   increased kappa light chain 63.4, normal lambda light chain 20, increased kappa/lambda ratio 3.17.  In March 2024, she was found to have slightly increased calcium level of 10.5. Repeat calcium on 10/26/2022 showed calcium level of 10. She denies history of abnormal bone pain or bone fracture.  occasional bone pain, particularly in the elbow and knee, following three previous falls.  Patient denies history of recurrent infection or atypical infections such as shingles of meningitis. Denies chills, night sweats, anorexia or abnormal weight loss.    MEDICAL HISTORY:  Past Medical History:  Diagnosis Date   Anemia    Anxiety    Chronic kidney disease    stage 3-4   Constipation    Depression    Dermatophytosis of nail    Diabetes mellitus without complication (HCC)    Diet controlled, lost weight, no meds   GERD (gastroesophageal reflux disease)    Hemorrhoids    History of shingles    HLD (hyperlipidemia)    Hypertension    Hypothyroidism    Keratoderma    Restless legs syndrome (RLS)    hx    SURGICAL HISTORY: Past Surgical History:  Procedure Laterality  Date   BREAST BIOPSY Right 09/30/2017   Affirm bx-calcs ( X clip),benign   BREAST EXCISIONAL BIOPSY Right    neg   cataracts     COLONOSCOPY W/ POLYPECTOMY     COLONOSCOPY WITH PROPOFOL N/A 02/10/2017   Procedure: COLONOSCOPY WITH PROPOFOL;  Surgeon: Scot Jun, MD;  Location[facility] ENDOSCOPY;  Service: Endoscopy;  Laterality: N/A;   ESOPHAGOGASTRODUODENOSCOPY (EGD) WITH PROPOFOL N/A 02/10/2017   Procedure: ESOPHAGOGASTRODUODENOSCOPY (EGD) WITH PROPOFOL;  Surgeon: Scot Jun, MD;  Location: [facility] ENDOSCOPY;  Service: Endoscopy;   Laterality: N/A;   EYE SURGERY     bilateral cataracts   HAND SURGERY     HARDWARE REMOVAL Left 07/23/2021   Procedure: HARDWARE REMOVAL;  Surgeon: Roby Lofts, MD;  Location: [facility] OR;  Service: Orthopedics;  Laterality: Left;   INTRAMEDULLARY (IM) NAIL INTERTROCHANTERIC Left 07/13/2021   Procedure: INTRAMEDULLARY (IM) NAIL INTERTROCHANTRIC;  Surgeon: Christena Flake, MD;  Location: [facility] ORS;  Service: Orthopedics;  Laterality: Left;   INTRAMEDULLARY (IM) NAIL INTERTROCHANTERIC Left 07/23/2021   Procedure: INTRAMEDULLARY (IM) NAIL INTERTROCHANTRIC;  Surgeon: Roby Lofts, MD;  Location: [facility] OR;  Service: Orthopedics;  Laterality: Left;   KIDNEY STONE SURGERY     ORIF FEMUR FRACTURE Left 07/23/2021   Procedure: OPEN REDUCTION INTERNAL FIXATION (ORIF) DISTAL FEMUR FRACTURE;  Surgeon: Roby Lofts, MD;  Location: [facility] OR;  Service: Orthopedics;  Laterality: Left;   ORIF HUMERUS FRACTURE Right 02/13/2020   Procedure: OPEN REDUCTION INTERNAL FIXATION (ORIF) SUPRACONDYLAR  HUMERUS FRACTURE;  Surgeon: Myrene Galas, MD;  Location: [facility] OR;  Service: Orthopedics;  Laterality: Right;   SACROPLASTY N/A 07/06/2019   Procedure: SACROPLASTY;  Surgeon: Kennedy Bucker, MD;  Location: [facility] ORS;  Service: Orthopedics;  Laterality: N/A;   TONSILECTOMY, ADENOIDECTOMY, BILATERAL MYRINGOTOMY AND TUBES     TONSILLECTOMY     TRIGGER FINGER RELEASE     Left ring finger   tubercular peritonitis      SOCIAL HISTORY: Social History   Socioeconomic History   Marital status: Widowed    Spouse name: Not on file   Number of children: 2   Years of education: Not on file   Highest education level: Some college, no degree  Occupational History   Not on file  Tobacco Use   Smoking status: Never   Smokeless tobacco: Never  Vaping Use   Vaping status: Never Used  Substance and Sexual Activity   Alcohol use: Not Currently   Drug use: No   Sexual activity: Not on file  Other Topics Concern   Not on file  Social  History Narrative   Not on file   Social Determinants of Health   Financial Resource Strain: Low Risk  (06/12/2022)   Overall Financial Resource Strain (CARDIA)    Difficulty of Paying Living Expenses: Not hard at all  Food Insecurity: No Food Insecurity (06/12/2022)   Hunger Vital Sign    Worried About Running Out of Food in the Last Year: Never true    Ran Out of Food in the Last Year: Never true  Transportation Needs: No Transportation Needs (06/12/2022)   PRAPARE - Administrator, Civil Service (Medical): No    Lack of Transportation (Non-Medical): No  Physical Activity: Insufficiently Active (06/12/2022)   Exercise Vital Sign    Days of Exercise per Week: 2 days    Minutes of Exercise per Session: 20 min  Stress: No Stress Concern Present (06/12/2022)   [employer] of Occupational Health -  Occupational Stress Questionnaire    Feeling of Stress : Not at all  Social Connections: Socially Isolated (06/12/2022)   Social Connection and Isolation Panel [NHANES]    Frequency of Communication with Friends and Family: More than three times a week    Frequency of Social Gatherings with Friends and Family: More than three times a week    Attends Religious Services: Never    Database administrator or Organizations: No    Attends Banker Meetings: Never    Marital Status: Widowed  Intimate Partner Violence: Not At Risk (06/12/2022)   Humiliation, Afraid, Rape, and Kick questionnaire    Fear of Current or Ex-Partner: No    Emotionally Abused: No    Physically Abused: No    Sexually Abused: No    FAMILY HISTORY: Family History  Adopted: Yes  Problem Relation Age of Onset   Breast cancer Neg Hx     ALLERGIES:  is allergic to aspirin, conray [iothalamate], dye fdc red [red dye #40 (allura red)], sulfa antibiotics, sulfasalazine, and prednisone.  MEDICATIONS:  Current Outpatient Medications  Medication Sig Dispense Refill   amLODipine (NORVASC) 5 MG tablet  TAKE 1 TABLET BY MOUTH ONCE DAILY 90 tablet 0   Blood Glucose Monitoring Suppl (ONE TOUCH ULTRA 2) w/Device KIT Use to check blood sugar as advised up to 2 times daily 1 kit 0   Cephalexin 250 MG tablet Take 1 tablet (250 mg total) by mouth daily in the afternoon. UTI prevention. 90 tablet 3   Cholecalciferol (VITAMIN D) 50 MCG (2000 UT) CAPS Take 2,000 Units by mouth daily.      cyanocobalamin (VITAMIN B12) 1000 MCG tablet Take 1,000 mcg by mouth daily.     escitalopram (LEXAPRO) 10 MG tablet Take 1 tablet (10 mg total) by mouth daily. with food 90 tablet 3   ferrous sulfate 325 (65 FE) MG tablet Take 325 mg by mouth daily.      glucose blood (ONE TOUCH ULTRA TEST) test strip CHECK BLOOD SUGAR UP TO 2 TIMES A DAY. 200 each 11   Histamine Dihydrochloride (AUSTRALIAN DREAM ARTHRITIS) 0.025 % CREA Apply 1 application topically daily as needed (Pain).     levothyroxine (SYNTHROID) 25 MCG tablet TAKE 1 TABLET BY MOUTH ONCE DAILY ON AN EMPTY STOMACH. WAIT 30 MINUTES BEFORE TAKING OTHER MEDS. 90 tablet 3   lisinopril (ZESTRIL) 5 MG tablet Take 1 tablet (5 mg total) by mouth daily. 90 tablet 3   metFORMIN (GLUCOPHAGE) 500 MG tablet TAKE 1 TABLET BY MOUTH TWICE DAILY WITH A MEAL 180 tablet 0   omeprazole (PRILOSEC) 20 MG capsule Take 1 capsule (20 mg total) by mouth daily before breakfast. 90 capsule 3   OneTouch Delica Lancets 33G MISC Use to check blood sugar up to 2 times daily 200 each 12   polyethylene glycol (MIRALAX / GLYCOLAX) 17 g packet Take 17 g by mouth daily as needed for moderate constipation or severe constipation. (Patient taking differently: Take 17 g by mouth daily as needed (constipation).) 14 each 0   senna (SENOKOT) 8.6 MG TABS tablet Take 1 tablet (8.6 mg total) by mouth daily. 120 tablet 0   simvastatin (ZOCOR) 40 MG tablet Take 1 tablet (40 mg total) by mouth at bedtime. 90 tablet 3   witch hazel-glycerin (TUCKS) pad 1 application  See admin instructions. Apply topically to rectum  daily as needed for itching     feeding supplement (ENSURE ENLIVE / ENSURE PLUS) LIQD Take 237  mLs by mouth 2 (two) times daily between meals. (Patient not taking: Reported on 11/09/2022) 237 mL 12   glycerin adult 2 g suppository Place 1 suppository rectally as needed for constipation. (Patient not taking: Reported on 11/09/2022)     No current facility-administered medications for this visit.    Review of Systems  Constitutional:  Negative for appetite change, chills, fatigue and fever.  HENT:   Negative for hearing loss and voice change.   Eyes:  Negative for eye problems.  Respiratory:  Negative for chest tightness and cough.   Cardiovascular:  Negative for chest pain.  Gastrointestinal:  Negative for abdominal distention, abdominal pain and blood in stool.  Endocrine: Negative for hot flashes.  Genitourinary:  Negative for difficulty urinating and frequency.   Musculoskeletal:  Positive for arthralgias.  Skin:  Negative for itching and rash.  Neurological:  Negative for extremity weakness.  Hematological:  Negative for adenopathy.  Psychiatric/Behavioral:  Negative for confusion.    PHYSICAL EXAMINATION: ECOG PERFORMANCE STATUS: 1 - Symptomatic but completely ambulatory Vitals:   11/11/22 1527  BP: (!) 156/66  Pulse: 82  Resp: 18  Temp: 97.6 F (36.4 C)  SpO2: 98%   Filed Weights   11/11/22 1527  Weight: 107 lb 1.6 oz (48.6 kg)    Physical Exam Constitutional:      General: She is not in acute distress.    Comments: Patient ambulates independently  HENT:     Head: Normocephalic and atraumatic.  Eyes:     General: No scleral icterus. Cardiovascular:     Rate and Rhythm: Normal rate and regular rhythm.     Heart sounds: Normal heart sounds.  Pulmonary:     Effort: Pulmonary effort is normal. No respiratory distress.     Breath sounds: No wheezing.  Abdominal:     General: Bowel sounds are normal. There is no distension.     Palpations: Abdomen is soft.   Musculoskeletal:        General: No deformity. Normal range of motion.     Cervical back: Normal range of motion and neck supple.  Skin:    General: Skin is warm and dry.     Findings: No erythema or rash.  Neurological:     Mental Status: She is alert and oriented to person, place, and time. Mental status is at baseline.     Cranial Nerves: No cranial nerve deficit.  Psychiatric:        Mood and Affect: Mood normal.      LABORATORY DATA:  I have reviewed the data as listed    Latest Ref Rng & Units 11/11/2022    4:11 PM 04/17/2022   10:32 AM 07/27/2021   12:23 PM  CBC  WBC 4.0 - 10.5 K/uL 7.4  8.1  8.9   Hemoglobin 12.0 - 15.0 g/dL 9.4  86.5  9.4   Hematocrit 36.0 - 46.0 % 28.2  31.4  27.2   Platelets 150 - 400 K/uL 279  319  616       Latest Ref Rng & Units 11/11/2022    4:11 PM 04/17/2022   10:32 AM 07/26/2021    1:47 AM  CMP  Glucose 70 - 99 mg/dL 784  696  295   BUN 8 - 23 mg/dL 17  16  20    Creatinine 0.44 - 1.00 mg/dL 2.84  1.32  4.40   Sodium 135 - 145 mmol/L 133  135  127   Potassium 3.5 - 5.1 mmol/L  4.3  5.1  4.6   Chloride 98 - 111 mmol/L 101  96  94   CO2 22 - 32 mmol/L 23  28  26    Calcium 8.9 - 10.3 mg/dL 9.3  16.1  8.3   Total Protein 6.5 - 8.1 g/dL 7.3  7.1    Total Bilirubin 0.3 - 1.2 mg/dL 0.3  0.4    Alkaline Phos 38 - 126 U/L 38     AST 15 - 41 U/L 22  17    ALT 0 - 44 U/L 15  8              RADIOGRAPHIC STUDIES: I have personally reviewed the radiological images as listed and agreed with the findings in the report.  No results found.

## 2022-11-11 NOTE — Assessment & Plan Note (Addendum)
Anemia is likely due to chronic kidney disease.   Iron panel is not consistent with iron deficiency.  I recommend patient continue oral iron supplementation which may help stabilizing her hemoglobin.

## 2022-11-11 NOTE — Assessment & Plan Note (Signed)
Mild elevated light chain ratio.  -This is nonspecific can be seen in patient with chronic kidney disease. Recommend to check 24-hour urine protein electrophoresis, repeat multiple myeloma panel, check CBC and CMP.

## 2022-11-13 ENCOUNTER — Other Ambulatory Visit: Payer: Self-pay

## 2022-11-13 DIAGNOSIS — R768 Other specified abnormal immunological findings in serum: Secondary | ICD-10-CM | POA: Diagnosis not present

## 2022-11-14 ENCOUNTER — Other Ambulatory Visit: Payer: Self-pay | Admitting: Family Medicine

## 2022-11-14 DIAGNOSIS — E1122 Type 2 diabetes mellitus with diabetic chronic kidney disease: Secondary | ICD-10-CM

## 2022-11-14 DIAGNOSIS — I10 Essential (primary) hypertension: Secondary | ICD-10-CM

## 2022-11-14 DIAGNOSIS — K219 Gastro-esophageal reflux disease without esophagitis: Secondary | ICD-10-CM

## 2022-11-16 LAB — MULTIPLE MYELOMA PANEL, SERUM
Albumin SerPl Elph-Mcnc: 3.9 g/dL (ref 2.9–4.4)
Albumin/Glob SerPl: 1.4 (ref 0.7–1.7)
Alpha 1: 0.3 g/dL (ref 0.0–0.4)
Alpha2 Glob SerPl Elph-Mcnc: 0.9 g/dL (ref 0.4–1.0)
B-Globulin SerPl Elph-Mcnc: 0.8 g/dL (ref 0.7–1.3)
Gamma Glob SerPl Elph-Mcnc: 0.8 g/dL (ref 0.4–1.8)
Globulin, Total: 2.8 g/dL (ref 2.2–3.9)
IgA: 171 mg/dL (ref 64–422)
IgG (Immunoglobin G), Serum: 765 mg/dL (ref 586–1602)
IgM (Immunoglobulin M), Srm: 213 mg/dL (ref 26–217)
Total Protein ELP: 6.7 g/dL (ref 6.0–8.5)

## 2022-11-16 NOTE — Telephone Encounter (Signed)
Requested Prescriptions  Pending Prescriptions Disp Refills   metFORMIN (GLUCOPHAGE) 500 MG tablet [Pharmacy Med Name: METFORMIN HCL 500 MG TAB] 180 tablet 1    Sig: TAKE 1 TABLET BY MOUTH TWICE DAILY WITH A MEAL     Endocrinology:  Diabetes - Biguanides Failed - 11/14/2022  2:18 PM      Failed - Cr in normal range and within 360 days    Creatinine  Date Value Ref Range Status  11/11/2022 1.21 (H) 0.44 - 1.00 mg/dL Final   Creat  Date Value Ref Range Status  04/17/2022 1.15 (H) 0.60 - 0.95 mg/dL Final         Failed - eGFR in normal range and within 360 days    GFR, Est African American  Date Value Ref Range Status  09/06/2019 63 > OR = 60 mL/min/1.51m2 Final   GFR, Est Non African American  Date Value Ref Range Status  09/06/2019 54 (L) > OR = 60 mL/min/1.51m2 Final   GFR, Estimated  Date Value Ref Range Status  11/11/2022 42 (L) >60 mL/min Final    Comment:    (NOTE) Calculated using the CKD-EPI Creatinine Equation (2021)    eGFR  Date Value Ref Range Status  04/17/2022 45 (L) > OR = 60 mL/min/1.57m2 Final         Failed - B12 Level in normal range and within 720 days    Vitamin B-12  Date Value Ref Range Status  04/17/2022 1,478 (H) 200 - 1,100 pg/mL Final         Failed - CBC within normal limits and completed in the last 12 months    WBC  Date Value Ref Range Status  11/11/2022 7.4 4.0 - 10.5 K/uL Final   RBC  Date Value Ref Range Status  11/11/2022 2.93 (L) 3.87 - 5.11 MIL/uL Final   RBC.  Date Value Ref Range Status  11/11/2022 2.98 (L) 3.87 - 5.11 MIL/uL Final   Hemoglobin  Date Value Ref Range Status  11/11/2022 9.4 (L) 12.0 - 15.0 g/dL Final   HGB  Date Value Ref Range Status  06/28/2012 9.7 (L) 12.0 - 16.0 g/dL Final   HCT  Date Value Ref Range Status  11/11/2022 28.2 (L) 36.0 - 46.0 % Final  06/28/2012 28.6 (L) 35.0 - 47.0 % Final   MCHC  Date Value Ref Range Status  11/11/2022 33.3 30.0 - 36.0 g/dL Final   [facility]  Date Value Ref  Range Status  11/11/2022 32.1 26.0 - 34.0 pg Final   MCV  Date Value Ref Range Status  11/11/2022 96.2 80.0 - 100.0 fL Final  06/28/2012 92 80 - 100 fL Final   No results found for: "PLTCOUNTKUC", "LABPLAT", "POCPLA" RDW  Date Value Ref Range Status  11/11/2022 12.6 11.5 - 15.5 % Final  06/28/2012 13.6 11.5 - 14.5 % Final         Passed - HBA1C is between 0 and 7.9 and within 180 days    Hemoglobin A1C  Date Value Ref Range Status  11/09/2022 6.9 (A) 4.0 - 5.6 % Final   Hgb A1c MFr Bld  Date Value Ref Range Status  04/17/2022 6.8 (H) <5.7 % of total Hgb Final    Comment:    For someone without known diabetes, a hemoglobin A1c value of 6.5% or greater indicates that they may have  diabetes and this should be confirmed with a follow-up  test. . For someone with known diabetes, a value <7% indicates  that their diabetes is well controlled and a value  greater than or equal to 7% indicates suboptimal  control. A1c targets should be individualized based on  duration of diabetes, age, comorbid conditions, and  other considerations. . Currently, no consensus exists regarding use of hemoglobin A1c for diagnosis of diabetes for children. [name] - Valid encounter within last 6 months    Recent Outpatient Visits           1 week ago Type 2 diabetes mellitus with stage 3a chronic kidney disease, without long-term current use of insulin [facility])   [place] [facility] [place], [name], DO   7 months ago Annual physical exam   [place] [facility] [name], DO   11 months ago Type 2 diabetes mellitus with stage 3a chronic kidney disease, without long-term current use of insulin (HCC)   [place] [facility] [name], DO   1 year ago S/p left hip fracture   [place] [facility] [name], DO   1 year ago Type 2 diabetes mellitus with stage  3a chronic kidney disease, without long-term current use of insulin (HCC)   [place] [facility] [name], DO       Future Appointments             In 1 month MacDiarmid, [name], MD [facility] [place]   In 5 months Althea Charon, [name], DO [facility]             amLODipine (NORVASC) 5 MG tablet [Pharmacy Med Name: AMLODIPINE BESYLATE 5 MG TAB] 90 tablet 1    Sig: TAKE 1 TABLET BY MOUTH ONCE DAILY     Cardiovascular: Calcium Channel Blockers 2 Failed - 11/14/2022  2:18 PM      Failed - Last BP in normal range    BP Readings from Last 1 Encounters:  11/11/22 (!) 156/66         Passed - Last Heart Rate in normal range    Pulse Readings from Last 1 Encounters:  11/11/22 82         Passed - Valid encounter within last 6 months    Recent Outpatient Visits           1 week ago Type 2 diabetes mellitus with stage 3a chronic kidney disease, without long-term current use of insulin (HCC)   [place] [facility] [place], [name], DO   7 months ago Annual physical exam   [place] [facility] [name], DO   11 months ago Type 2 diabetes mellitus with stage 3a chronic kidney disease, without long-term current use of insulin [facility])   [place] [facility] [place], [name], DO   1 year ago S/p left hip fracture   [place] [facility] [name], DO   1 year ago Type 2 diabetes mellitus with stage 3a chronic kidney disease, without long-term current use of insulin (HCC)   [place] [facility] [place], [name], DO       Future Appointments             In 1 month MacDiarmid, [name], MD [facility] [place]   In 5 months Althea Charon, [name], DO [place] [facility], [facility]             omeprazole (PRILOSEC) 20 MG capsule  [Pharmacy Med Name: OMEPRAZOLE DR 20 MG CAP] 90 capsule 3    Sig: TAKE 1 CAPSULE BY MOUTH ONCE DAILY     Gastroenterology: Proton Pump Inhibitors Passed - 11/14/2022  2:18 PM      Passed - Valid encounter within last 12 months    Recent Outpatient Visits           1 week ago Type 2 diabetes mellitus with stage 3a chronic kidney disease, without long-term current use of insulin [facility])   [facility] [place], [name], DO   7 months ago Annual physical exam   [place] [facility] [name], DO   11 months ago Type 2 diabetes mellitus with stage 3a chronic kidney disease, without long-term current use of insulin [facility])   [place] [facility] [name], DO   1 year ago S/p left hip fracture   [place] [facility] [name], DO   1 year ago Type 2 diabetes mellitus with stage 3a chronic kidney disease, without long-term current use of insulin [facility])   [place] [facility] [place], [name], DO       Future Appointments             In 1 month MacDiarmid, [name], MD [facility] [place]   In 5 months Althea Charon, [name], DO [facility]

## 2022-11-17 ENCOUNTER — Telehealth: Payer: Self-pay

## 2022-11-17 NOTE — Telephone Encounter (Signed)
-----   Message from Rickard Patience sent at 11/16/2022  9:20 PM EDT ----- Please let patient/family know that her work up is negative.  I recommend no additional testing for now. Follow up PRN

## 2022-11-17 NOTE — Telephone Encounter (Signed)
Pt informed of results and verbalized understanding.  

## 2022-11-18 LAB — IFE+PROTEIN ELECTRO, 24-HR UR
% BETA, Urine: 18.9 %
ALPHA 1 URINE: 6.7 %
Albumin, U: 51.2 %
Alpha 2, Urine: 11.6 %
GAMMA GLOBULIN URINE: 11.7 %
Total Protein, Urine-Ur/day: 279 mg/(24.h) — ABNORMAL HIGH (ref 30–150)
Total Protein, Urine: 16.9 mg/dL
Total Volume: 1650

## 2023-01-11 ENCOUNTER — Ambulatory Visit: Payer: PPO | Admitting: Urology

## 2023-01-23 ENCOUNTER — Other Ambulatory Visit: Payer: Self-pay | Admitting: Family Medicine

## 2023-01-23 DIAGNOSIS — E1169 Type 2 diabetes mellitus with other specified complication: Secondary | ICD-10-CM

## 2023-01-26 NOTE — Telephone Encounter (Signed)
  simvastatin  (ZOCOR ) 40 MG tablet 90 tablet 3 04/17/2022 --   Sig - Route: Take 1 tablet (40 mg total) by mouth at bedtime. - Oral   Sent to pharmacy as: simvastatin  (ZOCOR ) 40 MG tablet   E-Prescribing Status: Receipt confirmed by pharmacy (04/17/2022 10:31 AM EDT)     Requested Prescriptions  Pending Prescriptions Disp Refills   simvastatin  (ZOCOR ) 40 MG tablet [Pharmacy Med Name: SIMVASTATIN  40 MG TAB] 90 tablet 3    Sig: TAKE 1 TABLET BY MOUTH AT BEDTIME     Cardiovascular:  Antilipid - Statins Failed - 01/26/2023  2:28 PM      Failed - Lipid Panel in normal range within the last 12 months    Cholesterol  Date Value Ref Range Status  04/17/2022 167 <200 mg/dL Final   LDL Cholesterol (Calc)  Date Value Ref Range Status  04/17/2022 67 mg/dL (calc) Final    Comment:    Reference range: <100 . Desirable range <100 mg/dL for primary prevention;   <70 mg/dL for patients with CHD or diabetic patients  with > or = 2 CHD risk factors. SABRA LDL-C is now calculated using the Martin-Hopkins  calculation, which is a validated novel method providing  better accuracy than the Friedewald equation in the  estimation of LDL-C.  Gladis APPLETHWAITE et al. SANDREA. 7986;689(80): 2061-2068  (http://education.[employer].com/faq/FAQ164)    HDL  Date Value Ref Range Status  04/17/2022 83 > OR = 50 mg/dL Final   Triglycerides  Date Value Ref Range Status  04/17/2022 89 <150 mg/dL Final         Passed - Patient is not pregnant      Passed - Valid encounter within last 12 months    Recent Outpatient Visits           2 months ago Type 2 diabetes mellitus with stage 3a chronic kidney disease, without long-term current use of insulin  [facility])   [place] [facility] [place], Marsa PARAS, DO   9 months ago Annual physical exam   [place] [place] Marsa PARAS, DO   1 year ago Type 2 diabetes mellitus with stage 3a chronic kidney disease,  without long-term current use of insulin  [facility])   [place] [facility] [place] Marsa PARAS, DO   1 year ago S/p left hip fracture   [facility] [place] Marsa PARAS, DO   1 year ago Type 2 diabetes mellitus with stage 3a chronic kidney disease, without long-term current use of insulin  [facility])   [place] [facility] [place], Marsa PARAS, DO       Future Appointments             In 2 weeks MacDiarmid, [place], MD [place] [facility] [place]   In 3 months [place], Marsa PARAS, DO [place] [facility]

## 2023-02-05 ENCOUNTER — Other Ambulatory Visit: Payer: Self-pay | Admitting: Family Medicine

## 2023-02-05 DIAGNOSIS — N1831 Chronic kidney disease, stage 3a: Secondary | ICD-10-CM

## 2023-02-05 NOTE — Telephone Encounter (Signed)
Requested medication (s) are due for refill today: yes  Requested medication (s) are on the active medication list: yes  Last refill:  09/03/21  Future visit scheduled: no  Notes to clinic:  routing for approval.     Requested Prescriptions  Pending Prescriptions Disp Refills   ONETOUCH ULTRA TEST test strip [Pharmacy Med Name: ONETOUCH ULTRA TEST STRIP] 200 each 11    Sig: USE TO CHECK BLOOD SUGAR UP TO 2 TIMES ADAY AS DIRECTED     There is no refill protocol information for this order

## 2023-02-08 ENCOUNTER — Other Ambulatory Visit: Payer: Self-pay | Admitting: *Deleted

## 2023-02-08 DIAGNOSIS — N3020 Other chronic cystitis without hematuria: Secondary | ICD-10-CM

## 2023-02-08 DIAGNOSIS — N390 Urinary tract infection, site not specified: Secondary | ICD-10-CM

## 2023-02-08 MED ORDER — CEPHALEXIN 250 MG PO TABS: 250.0000 mg | ORAL_TABLET | Freq: Every day | ORAL | 0 refills | Status: DC

## 2023-02-15 ENCOUNTER — Ambulatory Visit: Payer: PPO | Admitting: Urology

## 2023-02-23 NOTE — Progress Notes (Addendum)
 Pa started   VERIZON (Key: [id number]) Rx #: [id number] Need Help? Call us  at [phone] Status New (Not sent to plan) Drug amLODIPine  Besylate 5MG  tablets ePA cloud logo Form [employer] [employer] [facility] Electronic Prior Authorization Form 2017 NCPDP Original Claim Info [id number]  02/23/23 Prior Authorization not required for patient/medication

## 2023-03-12 ENCOUNTER — Other Ambulatory Visit: Payer: Self-pay | Admitting: Family Medicine

## 2023-03-12 DIAGNOSIS — E063 Autoimmune thyroiditis: Secondary | ICD-10-CM

## 2023-03-15 NOTE — Telephone Encounter (Signed)
 Requested Prescriptions  Pending Prescriptions Disp Refills   levothyroxine (SYNTHROID) 25 MCG tablet [Pharmacy Med Name: LEVOTHYROXINE SODIUM 25 MCG TAB] 90 tablet 0    Sig: TAKE 1 TABLET BY MOUTH ONCE DAILY ON AN EMPTY STOMACH. WAIT 30 MINUTES BEFORE TAKING OTHER MEDS.     Endocrinology:  Hypothyroid Agents Failed - 03/15/2023  9:02 AM      Failed - TSH in normal range and within 360 days    TSH  Date Value Ref Range Status  04/17/2022 8.38 (H) 0.40 - 4.50 mIU/L Final         Passed - Valid encounter within last 12 months    Recent Outpatient Visits           4 months ago Type 2 diabetes mellitus with stage 3a chronic kidney disease, without long-term current use of insulin [facility])   [place] [facility] [name], DO   11 months ago Annual physical exam   [place] [facility] [name], DO   1 year ago Type 2 diabetes mellitus with stage 3a chronic kidney disease, without long-term current use of insulin [facility])   [place] [facility] [name], DO   1 year ago S/p left hip fracture   [place] [facility] [name], DO   2 years ago Type 2 diabetes mellitus with stage 3a chronic kidney disease, without long-term current use of insulin (HCC)   [place] [facility] [place], [name], DO       Future Appointments             In 2 weeks MacDiarmid, [name], MD [facility] [place]   In 1 month Althea Charon, [name], DO [place] [facility], [facility]

## 2023-03-23 DIAGNOSIS — H43813 Vitreous degeneration, bilateral: Secondary | ICD-10-CM | POA: Diagnosis not present

## 2023-03-23 DIAGNOSIS — H11122 Conjunctival concretions, left eye: Secondary | ICD-10-CM | POA: Diagnosis not present

## 2023-03-23 DIAGNOSIS — Z961 Presence of intraocular lens: Secondary | ICD-10-CM | POA: Diagnosis not present

## 2023-03-23 DIAGNOSIS — E119 Type 2 diabetes mellitus without complications: Secondary | ICD-10-CM | POA: Diagnosis not present

## 2023-03-23 LAB — HM DIABETES EYE EXAM

## 2023-03-29 ENCOUNTER — Encounter: Payer: Self-pay | Admitting: Urology

## 2023-03-29 ENCOUNTER — Ambulatory Visit: Payer: PPO | Admitting: Urology

## 2023-03-29 VITALS — BP 166/67 | HR 98 | Ht 59.0 in | Wt 107.6 lb

## 2023-03-29 DIAGNOSIS — N390 Urinary tract infection, site not specified: Secondary | ICD-10-CM

## 2023-03-29 DIAGNOSIS — Z09 Encounter for follow-up examination after completed treatment for conditions other than malignant neoplasm: Secondary | ICD-10-CM | POA: Diagnosis not present

## 2023-03-29 DIAGNOSIS — Z87440 Personal history of urinary (tract) infections: Secondary | ICD-10-CM | POA: Diagnosis not present

## 2023-03-29 DIAGNOSIS — N3020 Other chronic cystitis without hematuria: Secondary | ICD-10-CM

## 2023-03-29 LAB — MICROSCOPIC EXAMINATION
Epithelial Cells (non renal): >10 /HPF — AB (ref 0–10)
WBC, UA: >30 /HPF — AB (ref 0–5)

## 2023-03-29 LAB — URINALYSIS, COMPLETE
Bilirubin, UA: NEGATIVE
Glucose, UA: NEGATIVE
Ketones, UA: NEGATIVE
Nitrite, UA: NEGATIVE
Specific Gravity, UA: 1.01 (ref 1.005–1.030)
Urobilinogen, Ur: 0.2 mg/dL (ref 0.2–1.0)
pH, UA: 6 (ref 5.0–7.5)

## 2023-03-29 MED ORDER — CEPHALEXIN 250 MG PO TABS: 250.0000 mg | ORAL_TABLET | Freq: Every day | ORAL | 3 refills | Status: DC

## 2023-03-29 MED ORDER — GEMTESA 75 MG PO TABS: 75.0000 mg | ORAL_TABLET | Freq: Every day | ORAL | 11 refills | Status: DC

## 2023-03-29 MED ORDER — GEMTESA 75 MG PO TABS
75.0000 mg | ORAL_TABLET | Freq: Every day | ORAL | Status: AC
Start: 2023-03-29 — End: 2023-04-26

## 2023-03-29 NOTE — Progress Notes (Signed)
 03/29/2023 3:21 PM   Lisa Mills [birth year] [id number]  Referring provider: [name], DO [address] [place],  [place] [address]  Chief Complaint  Patient presents with   Follow-up    HPI: reviewed the last lengthy note. When I saw her 1 year ago she was infection free on Macrodantin. She was almost completely continent except for foot on the floor syndrome and she held urination too long  Patient is infection free on Macrodantin. I went over the package label risks with her in detail. In my opinion is not related to her current healthcare conditions. I gave her the option of stopping it. She talked to neurology as well. Frequency stable      Today And infection free on daily Keflex.  Frequency stable.  Clinically not infected No infections.  She is bothered by foot on the floor syndrome and getting up every 2 hours in the middle of the night.  No bedwetting.  No urge incontinence during the day with reasonable frequency during the day.  Her incontinence is much better than it was when I first saw her in 2021.  She had been given oxybutynin and never took it.         PMH: Past Medical History:  Diagnosis Date   Anemia    Anxiety    Chronic kidney disease    stage 3-4   Constipation    Depression    Dermatophytosis of nail    Diabetes mellitus without complication (HCC)    Diet controlled, lost weight, no meds   GERD (gastroesophageal reflux disease)    Hemorrhoids    History of shingles    HLD (hyperlipidemia)    Hypertension    Hypothyroidism    Keratoderma    Restless legs syndrome (RLS)    hx    Surgical History: Past Surgical History:  Procedure Laterality Date   BREAST BIOPSY Right 09/30/2017   Affirm bx-calcs ( X clip),benign   BREAST EXCISIONAL BIOPSY Right    neg   cataracts     COLONOSCOPY W/ POLYPECTOMY     COLONOSCOPY WITH PROPOFOL N/A 02/10/2017   Procedure: COLONOSCOPY WITH PROPOFOL;  Surgeon: Scot Jun, MD;  Location:  [facility] ENDOSCOPY;  Service: Endoscopy;  Laterality: N/A;   ESOPHAGOGASTRODUODENOSCOPY (EGD) WITH PROPOFOL N/A 02/10/2017   Procedure: ESOPHAGOGASTRODUODENOSCOPY (EGD) WITH PROPOFOL;  Surgeon: Scot Jun, MD;  Location: [facility] ENDOSCOPY;  Service: Endoscopy;  Laterality: N/A;   EYE SURGERY     bilateral cataracts   HAND SURGERY     HARDWARE REMOVAL Left 07/23/2021   Procedure: HARDWARE REMOVAL;  Surgeon: Roby Lofts, MD;  Location: [facility] OR;  Service: Orthopedics;  Laterality: Left;   INTRAMEDULLARY (IM) NAIL INTERTROCHANTERIC Left 07/13/2021   Procedure: INTRAMEDULLARY (IM) NAIL INTERTROCHANTRIC;  Surgeon: Christena Flake, MD;  Location: [facility] ORS;  Service: Orthopedics;  Laterality: Left;   INTRAMEDULLARY (IM) NAIL INTERTROCHANTERIC Left 07/23/2021   Procedure: INTRAMEDULLARY (IM) NAIL INTERTROCHANTRIC;  Surgeon: Roby Lofts, MD;  Location: [facility] OR;  Service: Orthopedics;  Laterality: Left;   KIDNEY STONE SURGERY     ORIF FEMUR FRACTURE Left 07/23/2021   Procedure: OPEN REDUCTION INTERNAL FIXATION (ORIF) DISTAL FEMUR FRACTURE;  Surgeon: Roby Lofts, MD;  Location: [facility] OR;  Service: Orthopedics;  Laterality: Left;   ORIF HUMERUS FRACTURE Right 02/13/2020   Procedure: OPEN REDUCTION INTERNAL FIXATION (ORIF) SUPRACONDYLAR  HUMERUS FRACTURE;  Surgeon: Myrene Galas, MD;  Location: [facility] OR;  Service: Orthopedics;  Laterality:  Right;   SACROPLASTY N/A 07/06/2019   Procedure: SACROPLASTY;  Surgeon: Kennedy Bucker, MD;  Location: [facility] ORS;  Service: Orthopedics;  Laterality: N/A;   TONSILECTOMY, ADENOIDECTOMY, BILATERAL MYRINGOTOMY AND TUBES     TONSILLECTOMY     TRIGGER FINGER RELEASE     Left ring finger   tubercular peritonitis      Home Medications:  Allergies as of 03/29/2023       Reactions   Aspirin Other (See Comments)   Burns stomach   Conray [iothalamate] Hives   IV dye conray-400   Dye Fdc Red [red Dye #40 (allura Red)] Hives   Sulfa Antibiotics Other (See Comments)   Unknown  reaction   Sulfasalazine Other (See Comments)   Unknown reaction   Prednisone Other (See Comments)   Indigestion         Medication List        Accurate as of March 29, 2023  3:21 PM. If you have any questions, ask your nurse or doctor.          amLODipine 5 MG tablet Commonly known as: NORVASC TAKE 1 TABLET BY MOUTH ONCE DAILY   [place] Dream Arthritis 0.025 % Crea Generic drug: Histamine Dihydrochloride Apply 1 application topically daily as needed (Pain).   Cephalexin 250 MG tablet Take 1 tablet (250 mg total) by mouth daily in the afternoon. UTI prevention.   cyanocobalamin 1000 MCG tablet Commonly known as: VITAMIN B12 Take 1,000 mcg by mouth daily.   escitalopram 10 MG tablet Commonly known as: LEXAPRO Take 1 tablet (10 mg total) by mouth daily. with food   feeding supplement Liqd Take 237 mLs by mouth 2 (two) times daily between meals.   ferrous sulfate 325 (65 FE) MG tablet Take 325 mg by mouth daily.   glycerin adult 2 g suppository Place 1 suppository rectally as needed for constipation.   levothyroxine 25 MCG tablet Commonly known as: SYNTHROID TAKE 1 TABLET BY MOUTH ONCE DAILY ON AN EMPTY STOMACH. WAIT 30 MINUTES BEFORE TAKING OTHER MEDS.   lisinopril 5 MG tablet Commonly known as: ZESTRIL Take 1 tablet (5 mg total) by mouth daily.   metFORMIN 500 MG tablet Commonly known as: GLUCOPHAGE TAKE 1 TABLET BY MOUTH TWICE DAILY WITH A MEAL   omeprazole 20 MG capsule Commonly known as: PRILOSEC TAKE 1 CAPSULE BY MOUTH ONCE DAILY   ONE TOUCH ULTRA 2 w/Device Kit Use to check blood sugar as advised up to 2 times daily   OneTouch Delica Lancets 33G Misc Use to check blood sugar up to 2 times daily   OneTouch Ultra Test test strip Generic drug: glucose blood USE TO CHECK BLOOD SUGAR UP TO 2 TIMES ADAY AS DIRECTED   polyethylene glycol 17 g packet Commonly known as: MIRALAX / GLYCOLAX Take 17 g by mouth daily as needed for moderate  constipation or severe constipation.   senna 8.6 MG Tabs tablet Commonly known as: SENOKOT Take 1 tablet (8.6 mg total) by mouth daily.   simvastatin 40 MG tablet Commonly known as: ZOCOR Take 1 tablet (40 mg total) by mouth at bedtime.   Vitamin D 50 MCG (2000 UT) Caps Take 2,000 Units by mouth daily.   witch hazel-glycerin pad Commonly known as: TUCKS 1 application  See admin instructions. Apply topically to rectum daily as needed for itching        Allergies:  Allergies  Allergen Reactions   Aspirin Other (See Comments)    Burns stomach   Conray [Iothalamate]  Hives    IV dye conray-400   Dye Fdc Red [Red Dye #40 (Allura Red)] Hives   Sulfa Antibiotics Other (See Comments)    Unknown reaction   Sulfasalazine Other (See Comments)    Unknown reaction   Prednisone Other (See Comments)    Indigestion     Family History: Family History  Adopted: Yes  Problem Relation Age of Onset   Breast cancer Neg Hx     Social History:  reports that she has never smoked. She has never used smokeless tobacco. She reports that she does not currently use alcohol. She reports that she does not use drugs.  ROS:                                        Physical Exam: BP (!) 166/67   Pulse 98   Ht 4\' 11"  (1.499 m)   Wt 48.8 kg   LMP  (LMP Unknown)   BMI 21.73 kg/m   Constitutional:  Alert and oriented, No acute distress. HEENT: [place] AT, moist mucus membranes.  Trachea midline, no masses.   Laboratory Data: Lab Results  Component Value Date   WBC 7.4 11/11/2022   HGB 9.4 (L) 11/11/2022   HCT 28.2 (L) 11/11/2022   MCV 96.2 11/11/2022   PLT 279 11/11/2022    Lab Results  Component Value Date   CREATININE 1.21 (H) 11/11/2022    No results found for: "PSA"  No results found for: "TESTOSTERONE"  Lab Results  Component Value Date   HGBA1C 6.9 (A) 11/09/2022    Urinalysis    Component Value Date/Time   COLORURINE YELLOW 02/13/2020 0622    APPEARANCEUR CLEAR 02/13/2020 0622   APPEARANCEUR Hazy (A) 10/02/2019 1509   LABSPEC 1.008 02/13/2020 0622   PHURINE 6.0 02/13/2020 0622   GLUCOSEU NEGATIVE 02/13/2020 0622   HGBUR NEGATIVE 02/13/2020 0622   BILIRUBINUR NEGATIVE 02/13/2020 0622   BILIRUBINUR Negative 10/02/2019 1509   KETONESUR NEGATIVE 02/13/2020 0622   PROTEINUR NEGATIVE 02/13/2020 0622   UROBILINOGEN Normal 03/29/2017 0000   NITRITE NEGATIVE 02/13/2020 0622   LEUKOCYTESUR NEGATIVE 02/13/2020 0622    Pertinent Imaging: Urine reviewed and sent for culture  Assessment & Plan: Patient will like to try Gemtesa and perhaps some other medications.  She was a little bit hesitant to take a second pill but she wants to try them.  Percutaneous tibial nerve stimulation might be an option.  Overall she is doing quite well and I renewed her daily Keflex.  1. Recurrent UTI (Primary)  - Urinalysis, Complete   No follow-ups on file.  Martina Sinner, MD  [facility] [address] [place], [place] [address] [phone]

## 2023-04-26 ENCOUNTER — Other Ambulatory Visit: Payer: Self-pay | Admitting: Family Medicine

## 2023-04-26 DIAGNOSIS — E871 Hypo-osmolality and hyponatremia: Secondary | ICD-10-CM | POA: Diagnosis not present

## 2023-04-26 DIAGNOSIS — N281 Cyst of kidney, acquired: Secondary | ICD-10-CM | POA: Diagnosis not present

## 2023-04-26 DIAGNOSIS — E1169 Type 2 diabetes mellitus with other specified complication: Secondary | ICD-10-CM

## 2023-04-26 DIAGNOSIS — E1122 Type 2 diabetes mellitus with diabetic chronic kidney disease: Secondary | ICD-10-CM | POA: Diagnosis not present

## 2023-04-26 DIAGNOSIS — N1831 Chronic kidney disease, stage 3a: Secondary | ICD-10-CM | POA: Diagnosis not present

## 2023-04-27 NOTE — Telephone Encounter (Signed)
 Requested medications are due for refill today.  yes  Requested medications are on the active medications list.  yes  Last refill. 04/17/2022 #90 3 rf  Future visit scheduled.   yes  Notes to clinic.  Labs are expired.    Requested Prescriptions  Pending Prescriptions Disp Refills   simvastatin (ZOCOR) 40 MG tablet [Pharmacy Med Name: SIMVASTATIN 40 MG TAB] 90 tablet 3    Sig: TAKE 1 TABLET BY MOUTH AT BEDTIME     Cardiovascular:  Antilipid - Statins Failed - 04/27/2023  1:47 PM      Failed - Valid encounter within last 12 months    Recent Outpatient Visits   None     Future Appointments             In 3 days Althea Charon [name], DO [place] [facility]   In 1 month MacDiarmid, [name], MD [facility] [place]            Failed - Lipid Panel in normal range within the last 12 months    Cholesterol  Date Value Ref Range Status  04/17/2022 167 <200 mg/dL Final   LDL Cholesterol (Calc)  Date Value Ref Range Status  04/17/2022 67 mg/dL (calc) Final    Comment:    Reference range: <100 . Desirable range <100 mg/dL for primary prevention;   <70 mg/dL for patients with CHD or diabetic patients  with > or = 2 CHD risk factors. Marland Kitchen LDL-C is now calculated using the Martin-Hopkins  calculation, which is a validated novel method providing  better accuracy than the Friedewald equation in the  estimation of LDL-C.  Horald Pollen et al. Lenox Ahr. 1914;782(95): 2061-2068  (http://education.Quest[employer].com/faq/FAQ164)    HDL  Date Value Ref Range Status  04/17/2022 83 > OR = 50 mg/dL Final   Triglycerides  Date Value Ref Range Status  04/17/2022 89 <150 mg/dL Final         Passed - Patient is not pregnant

## 2023-04-29 ENCOUNTER — Telehealth: Payer: Self-pay

## 2023-04-29 NOTE — Telephone Encounter (Addendum)
 Lisa Mills (Lisa Mills) Rx #: [id number] Need Help? Call us at [phone] Status New (Not sent to plan) Drug Simvastatin 40MG  tablets ePA cloud logo Form [employer] Medicare Electronic Prior Authorization Form 2017 NCPDP Original Claim Info [id number]  Prior Authorization not required for patient/medication

## 2023-04-30 ENCOUNTER — Encounter: Payer: Self-pay | Admitting: Family Medicine

## 2023-04-30 ENCOUNTER — Ambulatory Visit (INDEPENDENT_AMBULATORY_CARE_PROVIDER_SITE_OTHER): Payer: Self-pay | Admitting: Family Medicine

## 2023-04-30 VITALS — BP 130/58 | HR 77 | Ht 59.0 in | Wt 103.4 lb

## 2023-04-30 DIAGNOSIS — E538 Deficiency of other specified B group vitamins: Secondary | ICD-10-CM

## 2023-04-30 DIAGNOSIS — Z7984 Long term (current) use of oral hypoglycemic drugs: Secondary | ICD-10-CM | POA: Diagnosis not present

## 2023-04-30 DIAGNOSIS — E063 Autoimmune thyroiditis: Secondary | ICD-10-CM

## 2023-04-30 DIAGNOSIS — E1169 Type 2 diabetes mellitus with other specified complication: Secondary | ICD-10-CM

## 2023-04-30 DIAGNOSIS — R0789 Other chest pain: Secondary | ICD-10-CM | POA: Diagnosis not present

## 2023-04-30 DIAGNOSIS — N184 Chronic kidney disease, stage 4 (severe): Secondary | ICD-10-CM

## 2023-04-30 DIAGNOSIS — I129 Hypertensive chronic kidney disease with stage 1 through stage 4 chronic kidney disease, or unspecified chronic kidney disease: Secondary | ICD-10-CM

## 2023-04-30 DIAGNOSIS — E1122 Type 2 diabetes mellitus with diabetic chronic kidney disease: Secondary | ICD-10-CM | POA: Diagnosis not present

## 2023-04-30 DIAGNOSIS — N1831 Chronic kidney disease, stage 3a: Secondary | ICD-10-CM | POA: Diagnosis not present

## 2023-04-30 DIAGNOSIS — N183 Chronic kidney disease, stage 3 unspecified: Secondary | ICD-10-CM | POA: Diagnosis not present

## 2023-04-30 DIAGNOSIS — E785 Hyperlipidemia, unspecified: Secondary | ICD-10-CM

## 2023-04-30 DIAGNOSIS — D631 Anemia in chronic kidney disease: Secondary | ICD-10-CM

## 2023-04-30 MED ORDER — LISINOPRIL 5 MG PO TABS: 5.0000 mg | ORAL_TABLET | Freq: Every day | ORAL | 3 refills | Status: AC

## 2023-04-30 MED ORDER — METFORMIN HCL 500 MG PO TABS: 500.0000 mg | ORAL_TABLET | Freq: Two times a day (BID) | ORAL | 3 refills | Status: AC

## 2023-04-30 MED ORDER — LEVOTHYROXINE SODIUM 25 MCG PO TABS
ORAL_TABLET | ORAL | 3 refills | Status: AC
Start: 2023-04-30 — End: ?

## 2023-04-30 NOTE — Patient Instructions (Addendum)
 Thank you for coming to the office today.  Labs today  We can fax to Dr Thedore Mins  Keep up with Urologist  If you have any significant chest pain that does not go away within 30 minutes, is accompanied by nausea, sweating, shortness of breath, or made worse by activity, this may be evidence of a heart attack, especially if symptoms worsening instead of improving, please call 911 or go directly to the emergency room immediately for evaluation.   Please schedule a Follow-up Appointment to: Return if symptoms worsen or fail to improve.  If you have any other questions or concerns, please feel free to call the office or send a message through MyChart. You may also schedule an earlier appointment if necessary.  Additionally, you may be receiving a survey about your experience at our office within a few days to 1 week by e-mail or mail. We value your feedback.  Saralyn Pilar, DO [facility], [place]

## 2023-04-30 NOTE — Progress Notes (Signed)
 Subjective:    Patient ID: Lisa Mills, female    DOB: 1929/07/11, 89 y.o.   MRN: [id number]  Lisa Mills is a 89 y.o. female presenting on 04/30/2023 for No chief complaint on file.   HPI  Discussed the use of AI scribe software for clinical note transcription with the patient, who gave verbal consent to proceed.  History of Present Illness   Lisa Mills is a 89 year old female who presents for a blood panel to evaluate persistent anemia. Here with her son today.   She has a history of persistent anemia and is currently undergoing evaluation for this condition. She takes oral iron supplements with vitamin C to enhance absorption, as recommended by a hematologist she consulted last October. Despite these efforts, she continues to experience anemia, prompting the need for a blood panel to assess her iron levels and other parameters.  She experiences urinary frequency, particularly nocturia, and was prescribed Gemtesa. After taking the medication for about four weeks, she did not notice any improvement and continues to wake up every one and a half to two hours at night to urinate. She has an upcoming appointment with her urologist next month to address this issue. - she has history of recurrent cystitis UTI. On antibiotic prophylaxis  Onset 6 weeks ago, She experiences intermittent chest pain, mostly at night when lying down, lasting only a few seconds. The pain is located between her chest and breast and does not hurt to the touch. She has not taken any medication for this pain as it is brief and infrequent. No chest pain with deep breathing, shortness of breath, nausea, or sweating. - Today no pain  She reports ongoing constipation, which she manages by taking Miralax daily in her coffee. This has been a persistent issue, but she has found a routine that works for her.       Type 2 Diabetes CKD IIIa Anemia CKD Bilateral Renal Cysts multiple HTN Followed by Dr Thedore Mins  Nephrology Pending labs  Last visit not indicated for infusion On 1 iron pill per day (could not tolerate 2) apt 11/11/22 Dr Cathie Hoops with history Anemia, CKD   On Lisinopril 5mg , Amlodipine 5mg  daily On Metformin 500mg  TWICE A DAY, doing well with this No hypoglycemia    UTI Prophylaxis Followed by Urology Dr Zorita Pang on Cephalexin 250mg  daily has plenty of refills   Chronic Left Hip problem S/p Left Hip Fracture Improving mobility overall. Using cane   Right hand problem with numbness tingling, loss of function.   Hearing Loss Cerumen impacted. Request flushing       04/30/2023   10:43 AM 11/09/2022    1:45 PM 06/12/2022    1:30 PM  Depression screen PHQ 2/9  Decreased Interest 0 0 0  Down, Depressed, Hopeless 0 0 0  PHQ - 2 Score 0 0 0  Altered sleeping 0 0 0  Tired, decreased energy 2 0 0  Change in appetite 0 0 0  Feeling bad or failure about yourself  0 0 0  Trouble concentrating 0 0 0  Moving slowly or fidgety/restless 0 0 0  Suicidal thoughts 0 0 0  PHQ-9 Score 2 0 0  Difficult doing work/chores Not difficult at all  Not difficult at all       04/30/2023   10:43 AM 11/09/2022    1:45 PM 12/08/2021    3:42 PM 03/14/2021    1:29 PM  GAD 7 : Generalized Anxiety Score  Nervous, Anxious, on Edge 1 0 1 1  Control/stop worrying 0 0 0 0  Worry too much - different things 0 0 0 0  Trouble relaxing 1 0 0 0  Restless 0 0 0 0  Easily annoyed or irritable 0 0 0 0  Afraid - awful might happen 0 0 0 0  Total GAD 7 Score 2 0 1 1  Anxiety Difficulty Not difficult at all  Not difficult at all Not difficult at all    Social History   Tobacco Use   Smoking status: Never   Smokeless tobacco: Never  Vaping Use   Vaping status: Never Used  Substance Use Topics   Alcohol use: Not Currently   Drug use: No    Review of Systems Per HPI unless specifically indicated above     Objective:    BP (!) 130/58 (BP Location: Left Arm, Patient Position: Sitting,  Cuff Size: Normal)   Pulse 77   Ht 4\' 11"  (1.499 m)   Wt 103 lb 6 oz (46.9 kg)   LMP  (LMP Unknown)   SpO2 95%   BMI 20.88 kg/m   Wt Readings from Last 3 Encounters:  04/30/23 103 lb 6 oz (46.9 kg)  03/29/23 107 lb 9.6 oz (48.8 kg)  11/11/22 107 lb 1.6 oz (48.6 kg)    Physical Exam Vitals and nursing note reviewed.  Constitutional:      General: She is not in acute distress.    Appearance: She is well-developed. She is not diaphoretic.     Comments: Well-appearing elderly 89 yr female, comfortable, cooperative  HENT:     Head: Normocephalic and atraumatic.  Eyes:     General:        Right eye: No discharge.        Left eye: No discharge.     Conjunctiva/sclera: Conjunctivae normal.  Neck:     Thyroid: No thyromegaly.  Cardiovascular:     Rate and Rhythm: Normal rate and regular rhythm.     Heart sounds: Normal heart sounds. No murmur heard. Pulmonary:     Effort: Pulmonary effort is normal. No respiratory distress.     Breath sounds: Normal breath sounds. No wheezing or rales.  Chest:     Chest wall: No tenderness.  Musculoskeletal:        General: Normal range of motion.     Cervical back: Normal range of motion and neck supple.  Lymphadenopathy:     Cervical: No cervical adenopathy.  Skin:    General: Skin is warm and dry.     Findings: No erythema or rash.  Neurological:     Mental Status: She is alert and oriented to person, place, and time.  Psychiatric:        Behavior: Behavior normal.     Comments: Well groomed, good eye contact, normal speech and thoughts     Results for orders placed or performed in visit on 04/21/23  HM DIABETES EYE EXAM   Collection Time: 03/23/23 10:49 AM  Result Value Ref Range   HM Diabetic Eye Exam No Retinopathy No Retinopathy      Assessment & Plan:   Problem List Items Addressed This Visit     Anemia in chronic kidney disease (CKD) (Chronic)   Relevant Orders   CBC with Differential/Platelet   Iron, TIBC and  Ferritin Panel   Benign hypertension with CKD (chronic kidney disease) stage III (HCC)   Relevant Orders   Comprehensive metabolic panel with GFR  CKD stage 3 due to type 2 diabetes mellitus (HCC)   Relevant Orders   CBC with Differential/Platelet   Comprehensive metabolic panel with GFR   Hyperlipidemia associated with type 2 diabetes mellitus (HCC)   Relevant Orders   TSH   Lipid panel   Hypothyroidism   Relevant Orders   TSH   T4, free   Type 2 diabetes with stage 3 chronic kidney disease GFR 30-59 (HCC) - Primary   Relevant Orders   POCT HgB A1C   Hemoglobin A1c   Urine Microalbumin w/creat. ratio   Other Visit Diagnoses       Vitamin B12 deficiency       Relevant Orders   Vitamin B12        Atypical Chest Pain Intermittent, nocturnal, non-exertional chest pain without any assoc symptoms, seems to be very brief episodic infrequent likely non-cardiac. Differential includes indigestion or neuralgia.  Emergency evaluation if severe or persistent. No active pain today. Not reproducible on exam - Monitor symptoms, consider acetaminophen or aspirin for pain. - She plans to switch to non-wired bras as she feels it is provoking the pain - Seek emergency care if pain becomes severe, persistent, or is accompanied by other concerning symptoms.  Type 2 Diabetes Unable to draw POC A1c Check lab today. Pending results Continue current therapy on Metformin 500mg  TWICE A DAY  Hypertension Hypertension well-controlled with current regimen.  - Continue current antihypertensive regimen. Amlodipine 5mg  daily, Lisinopril 5mg  daily  Chronic Recurrent UTI Cystitis / Urinary Frequency managed by [facility]. On Chronic antibiotic prophylaxis Prior Gemtesa ineffective. Follow up with them next month  Anemia Chronic anemia managed with oral iron and vitamin C. Blood panel due for assessment. - Order blood panel including iron panel, kidney function, electrolytes, glucose, B12, and  thyroid tests. - Perform urinalysis for kidney function. - Continue oral iron supplements and vitamin C. - Fax results to Dr. Thedore Mins. Nephrology  Constipation Chronic constipation managed with Miralax. - Continue Miralax as needed.       Fax results to Dr Thedore Mins - Nephrology  Orders Placed This Encounter  Procedures   TSH   Lipid panel    Has the patient fasted?:   Yes   Hemoglobin A1c   CBC with Differential/Platelet   Comprehensive metabolic panel with GFR    Has the patient fasted?:   Yes   Urine Microalbumin w/creat. ratio   T4, free   Iron, TIBC and Ferritin Panel   Vitamin B12   POCT HgB A1C    No orders of the defined types were placed in this encounter.   Follow up plan: Return if symptoms worsen or fail to improve.   Saralyn Pilar, DO [facility] 04/30/2023, 10:58 AM

## 2023-05-01 LAB — CBC WITH DIFFERENTIAL/PLATELET
Absolute Lymphocytes: 1174 {cells}/uL (ref 850–3900)
Absolute Monocytes: 549 {cells}/uL (ref 200–950)
Basophils Absolute: 71 {cells}/uL (ref 0–200)
Basophils Relative: 1.2 %
Eosinophils Absolute: 112 {cells}/uL (ref 15–500)
Eosinophils Relative: 1.9 %
HCT: 29.7 % — ABNORMAL LOW (ref 35.0–45.0)
Hemoglobin: 9.6 g/dL — ABNORMAL LOW (ref 11.7–15.5)
MCH: 30.6 pg (ref 27.0–33.0)
MCHC: 32.3 g/dL (ref 32.0–36.0)
MCV: 94.6 fL (ref 80.0–100.0)
MPV: 9 fL (ref 7.5–12.5)
Monocytes Relative: 9.3 %
Neutro Abs: 3994 {cells}/uL (ref 1500–7800)
Neutrophils Relative %: 67.7 %
Platelets: 321 10*3/uL (ref 140–400)
RBC: 3.14 10*6/uL — ABNORMAL LOW (ref 3.80–5.10)
RDW: 12.7 % (ref 11.0–15.0)
Total Lymphocyte: 19.9 %
WBC: 5.9 10*3/uL (ref 3.8–10.8)

## 2023-05-01 LAB — COMPREHENSIVE METABOLIC PANEL WITH GFR
AG Ratio: 1.9 (calc) (ref 1.0–2.5)
ALT: 10 U/L (ref 6–29)
AST: 17 U/L (ref 10–35)
Albumin: 4.5 g/dL (ref 3.6–5.1)
Alkaline phosphatase (APISO): 41 U/L (ref 37–153)
BUN/Creatinine Ratio: 15 (calc) (ref 6–22)
BUN: 20 mg/dL (ref 7–25)
CO2: 27 mmol/L (ref 20–32)
Calcium: 10 mg/dL (ref 8.6–10.4)
Chloride: 96 mmol/L — ABNORMAL LOW (ref 98–110)
Creat: 1.32 mg/dL — ABNORMAL HIGH (ref 0.60–0.95)
Globulin: 2.4 g/dL (ref 1.9–3.7)
Glucose, Bld: 134 mg/dL (ref 65–139)
Potassium: 4.8 mmol/L (ref 3.5–5.3)
Sodium: 133 mmol/L — ABNORMAL LOW (ref 135–146)
Total Bilirubin: 0.3 mg/dL (ref 0.2–1.2)
Total Protein: 6.9 g/dL (ref 6.1–8.1)
eGFR: 38 mL/min/{1.73_m2} — ABNORMAL LOW (ref >=60)

## 2023-05-01 LAB — LIPID PANEL
Cholesterol: 144 mg/dL (ref <200)
HDL: 65 mg/dL (ref >=50)
LDL Cholesterol (Calc): 62 mg/dL
Non-HDL Cholesterol (Calc): 79 mg/dL (ref <130)
Total CHOL/HDL Ratio: 2.2 (calc) (ref <5.0)
Triglycerides: 90 mg/dL (ref <150)

## 2023-05-01 LAB — IRON,TIBC AND FERRITIN PANEL
%SAT: 15 % — ABNORMAL LOW (ref 16–45)
Ferritin: 129 ng/mL (ref 16–288)
Iron: 41 ug/dL — ABNORMAL LOW (ref 45–160)
TIBC: 281 ug/dL (ref 250–450)

## 2023-05-01 LAB — HEMOGLOBIN A1C
Hgb A1c MFr Bld: 6.7 %{Hb} — ABNORMAL HIGH (ref <5.7)
Mean Plasma Glucose: 146 mg/dL
eAG (mmol/L): 8.1 mmol/L

## 2023-05-01 LAB — MICROALBUMIN / CREATININE URINE RATIO
Creatinine, Urine: 82 mg/dL (ref 20–275)
Microalb Creat Ratio: 135 mg/g{creat} — ABNORMAL HIGH (ref <30)
Microalb, Ur: 11.1 mg/dL

## 2023-05-01 LAB — VITAMIN B12: Vitamin B-12: >2000 pg/mL — ABNORMAL HIGH (ref 200–1100)

## 2023-05-01 LAB — TSH: TSH: 5.38 m[IU]/L — ABNORMAL HIGH (ref 0.40–4.50)

## 2023-05-01 LAB — T4, FREE: Free T4: 1.2 ng/dL (ref 0.8–1.8)

## 2023-05-10 ENCOUNTER — Ambulatory Visit: Payer: PPO | Admitting: Family Medicine

## 2023-05-17 ENCOUNTER — Other Ambulatory Visit: Payer: Self-pay | Admitting: Family Medicine

## 2023-05-17 DIAGNOSIS — I10 Essential (primary) hypertension: Secondary | ICD-10-CM

## 2023-05-18 ENCOUNTER — Other Ambulatory Visit: Payer: Self-pay | Admitting: Family Medicine

## 2023-05-18 NOTE — Telephone Encounter (Unsigned)
 Copied from CRM [id number]. Topic: Clinical - Medication Refill >> May 18, 2023  1:04 PM Baldomero Bone wrote: Most Recent Primary Care Visit:  Provider: Raina Bunting  Department: [facility]  Visit Type: OFFICE VISIT  Date: 04/30/2023  Medication: amLODipine  (NORVASC ) 5 MG tablet  Has the patient contacted their pharmacy? Yes (Agent: If no, request that the patient contact the pharmacy for the refill. If patient does not wish to contact the pharmacy document the reason why and proceed with request.) (Agent: If yes, when and what did the pharmacy advise?)  Is this the correct pharmacy for this prescription? Yes If no, delete pharmacy and type the correct one.  This is the patient's preferred pharmacy:  [facility] - [place], [place] - [address] [place] [address] Phone: [phone] Fax: [phone]   Has the prescription been filled recently? No  Is the patient out of the medication? No  Has the patient been seen for an appointment in the last year OR does the patient have an upcoming appointment? Yes  Can we respond through MyChart? No  Agent: Please be advised that Rx refills may take up to 3 business days. We ask that you follow-up with your pharmacy.

## 2023-05-19 NOTE — Telephone Encounter (Signed)
 Requested Prescriptions  Pending Prescriptions Disp Refills   amLODipine  (NORVASC ) 5 MG tablet [Pharmacy Med Name: AMLODIPINE  BESYLATE 5 MG TAB] 90 tablet 0    Sig: TAKE 1 TABLET BY MOUTH ONCE DAILY     Cardiovascular: Calcium  Channel Blockers 2 Passed - 05/19/2023 11:19 AM      Passed - Last BP in normal range    BP Readings from Last 1 Encounters:  04/30/23 (!) 130/58         Passed - Last Heart Rate in normal range    Pulse Readings from Last 1 Encounters:  04/30/23 77         Passed - Valid encounter within last 6 months    Recent Outpatient Visits           2 weeks ago Type 2 diabetes mellitus with stage 3a chronic kidney disease, without long-term current use of insulin  [facility])   [facility] [place], Kayleen Party, DO       Future Appointments             In 1 week MacDiarmid, Geralyn Knee, MD [facility]

## 2023-05-24 ENCOUNTER — Other Ambulatory Visit: Payer: Self-pay | Admitting: Family Medicine

## 2023-05-24 DIAGNOSIS — F418 Other specified anxiety disorders: Secondary | ICD-10-CM

## 2023-05-25 NOTE — Telephone Encounter (Signed)
 Requested Prescriptions  Pending Prescriptions Disp Refills   escitalopram  (LEXAPRO ) 10 MG tablet [Pharmacy Med Name: ESCITALOPRAM  OXALATE 10 MG TAB] 90 tablet 1    Sig: TAKE 1 TABLET BY MOUTH ONCE DAILY WITH FOOD     Psychiatry:  Antidepressants - SSRI Passed - 05/25/2023  5:28 PM      Passed - Completed PHQ-2 or PHQ-9 in the last 360 days      Passed - Valid encounter within last 6 months    Recent Outpatient Visits           3 weeks ago Type 2 diabetes mellitus with stage 3a chronic kidney disease, without long-term current use of insulin  [facility])   [facility] [place], Kayleen Party, DO       Future Appointments             In 6 days MacDiarmid, Geralyn Knee, MD [facility]

## 2023-05-31 ENCOUNTER — Ambulatory Visit: Admitting: Urology

## 2023-05-31 ENCOUNTER — Encounter: Payer: Self-pay | Admitting: Urology

## 2023-05-31 VITALS — BP 145/63 | HR 84 | Ht 61.0 in | Wt 105.0 lb

## 2023-05-31 DIAGNOSIS — N390 Urinary tract infection, site not specified: Secondary | ICD-10-CM | POA: Diagnosis not present

## 2023-05-31 DIAGNOSIS — R350 Frequency of micturition: Secondary | ICD-10-CM | POA: Diagnosis not present

## 2023-05-31 LAB — URINALYSIS, COMPLETE
Bilirubin, UA: NEGATIVE
Glucose, UA: NEGATIVE
Ketones, UA: NEGATIVE
Nitrite, UA: POSITIVE — AB
RBC, UA: NEGATIVE
Specific Gravity, UA: 1.015 (ref 1.005–1.030)
Urobilinogen, Ur: 0.2 mg/dL (ref 0.2–1.0)
pH, UA: 6 (ref 5.0–7.5)

## 2023-05-31 LAB — MICROSCOPIC EXAMINATION: WBC, UA: >30 /HPF — AB (ref 0–5)

## 2023-05-31 MED ORDER — MIRABEGRON ER 50 MG PO TB24: 50.0000 mg | ORAL_TABLET | Freq: Every day | ORAL | 11 refills | Status: DC

## 2023-05-31 NOTE — Progress Notes (Signed)
 05/31/2023 3:13 PM   Lisa Mills 02-Jan-1930 [id number]  Referring provider: Raina Bunting, DO [address] [place],  [place] [address]  No chief complaint on file.   HPI: reviewed the last lengthy note. When I saw her 1 year ago she was infection free on Macrodantin . She was almost completely continent except for foot on the floor syndrome and she held urination too long  Patient is infection free on Macrodantin . I went over the package label risks with her in detail. In my opinion is not related to her current healthcare conditions. I gave her the option of stopping it. She talked to neurology as well. Frequency stable      Today And infection free on daily Keflex .  Frequency stable.  Clinically not infected No infections.  She is bothered by foot on the floor syndrome and getting up every 2 hours in the middle of the night.  No bedwetting.  No urge incontinence during the day with reasonable frequency during the day.  Her incontinence is much better than it was when I first saw her in 2021.  She had been given oxybutynin  and never took it.    Patient will like to try Gemtesa  and perhaps some other medications. She was a little bit hesitant to take a second pill but she wants to try them. Percutaneous tibial nerve stimulation might be an option. Overall she is doing quite well and I renewed her daily Keflex .   Today Niccoli not infected but urine sent for culture.  Still has frequency especially at night.  Gemtesa  does not work   PMH: Past Medical History:  Diagnosis Date   Anemia    Anxiety    Chronic kidney disease    stage 3-4   Constipation    Depression    Dermatophytosis of nail    Diabetes mellitus without complication (HCC)    Diet controlled, lost weight, no meds   GERD (gastroesophageal reflux disease)    Hemorrhoids    History of shingles    HLD (hyperlipidemia)    Hypertension    Hypothyroidism    Keratoderma    Restless legs syndrome (RLS)    hx     Surgical History: Past Surgical History:  Procedure Laterality Date   BREAST BIOPSY Right 09/30/2017   Affirm bx-calcs ( X clip),benign   BREAST EXCISIONAL BIOPSY Right    neg   cataracts     COLONOSCOPY W/ POLYPECTOMY     COLONOSCOPY WITH PROPOFOL  N/A 02/10/2017   Procedure: COLONOSCOPY WITH PROPOFOL ;  Surgeon: Cassie Click, MD;  Location[facility] ENDOSCOPY;  Service: Endoscopy;  Laterality: N/A;   ESOPHAGOGASTRODUODENOSCOPY (EGD) WITH PROPOFOL  N/A 02/10/2017   Procedure: ESOPHAGOGASTRODUODENOSCOPY (EGD) WITH PROPOFOL ;  Surgeon: Cassie Click, MD;  Location: [facility] ENDOSCOPY;  Service: Endoscopy;  Laterality: N/A;   EYE SURGERY     bilateral cataracts   HAND SURGERY     HARDWARE REMOVAL Left 07/23/2021   Procedure: HARDWARE REMOVAL;  Surgeon: Laneta Pintos, MD;  Location: [facility] OR;  Service: Orthopedics;  Laterality: Left;   INTRAMEDULLARY (IM) NAIL INTERTROCHANTERIC Left 07/13/2021   Procedure: INTRAMEDULLARY (IM) NAIL INTERTROCHANTRIC;  Surgeon: Elner Hahn, MD;  Location: [facility] ORS;  Service: Orthopedics;  Laterality: Left;   INTRAMEDULLARY (IM) NAIL INTERTROCHANTERIC Left 07/23/2021   Procedure: INTRAMEDULLARY (IM) NAIL INTERTROCHANTRIC;  Surgeon: Laneta Pintos, MD;  Location: [facility] OR;  Service: Orthopedics;  Laterality: Left;   KIDNEY STONE SURGERY     ORIF FEMUR  FRACTURE Left 07/23/2021   Procedure: OPEN REDUCTION INTERNAL FIXATION (ORIF) DISTAL FEMUR FRACTURE;  Surgeon: Laneta Pintos, MD;  Location: [facility] OR;  Service: Orthopedics;  Laterality: Left;   ORIF HUMERUS FRACTURE Right 02/13/2020   Procedure: OPEN REDUCTION INTERNAL FIXATION (ORIF) SUPRACONDYLAR  HUMERUS FRACTURE;  Surgeon: Hardy Lia, MD;  Location: [facility] OR;  Service: Orthopedics;  Laterality: Right;   SACROPLASTY N/A 07/06/2019   Procedure: SACROPLASTY;  Surgeon: Molli Angelucci, MD;  Location: [facility] ORS;  Service: Orthopedics;  Laterality: N/A;   TONSILECTOMY, ADENOIDECTOMY, BILATERAL MYRINGOTOMY AND TUBES      TONSILLECTOMY     TRIGGER FINGER RELEASE     Left ring finger   tubercular peritonitis      Home Medications:  Allergies as of 05/31/2023       Reactions   Aspirin Other (See Comments)   Burns stomach   Conray [iothalamate] Hives   IV dye conray-400   Dye Fdc Red [red Dye #40 (allura Red)] Hives   Sulfa Antibiotics Other (See Comments)   Unknown reaction   Sulfasalazine Other (See Comments)   Unknown reaction   Prednisone Other (See Comments)   Indigestion         Medication List        Accurate as of May 31, 2023  3:13 PM. If you have any questions, ask your nurse or doctor.          amLODipine  5 MG tablet Commonly known as: NORVASC  TAKE 1 TABLET BY MOUTH ONCE DAILY   [place] Dream Arthritis 0.025 % Crea Generic drug: Histamine Dihydrochloride Apply 1 application topically daily as needed (Pain).   Cephalexin  250 MG tablet Take 1 tablet (250 mg total) by mouth daily in the afternoon. UTI prevention.   cyanocobalamin  1000 MCG tablet Commonly known as: VITAMIN B12 Take 1,000 mcg by mouth daily.   escitalopram  10 MG tablet Commonly known as: LEXAPRO  TAKE 1 TABLET BY MOUTH ONCE DAILY WITH FOOD   feeding supplement Liqd Take 237 mLs by mouth 2 (two) times daily between meals.   ferrous sulfate  325 (65 FE) MG tablet Take 325 mg by mouth daily.   Gemtesa  75 MG Tabs Generic drug: Vibegron  Take 1 tablet (75 mg total) by mouth daily.   glycerin adult 2 g suppository Place 1 suppository rectally as needed for constipation.   levothyroxine  25 MCG tablet Commonly known as: SYNTHROID  TAKE 1 TABLET BY MOUTH ONCE DAILY ON AN EMPTY STOMACH. WAIT 30 MINUTES BEFORE TAKING OTHER MEDS.   lisinopril  5 MG tablet Commonly known as: ZESTRIL  Take 1 tablet (5 mg total) by mouth daily.   metFORMIN  500 MG tablet Commonly known as: GLUCOPHAGE  Take 1 tablet (500 mg total) by mouth 2 (two) times daily with a meal.   omeprazole  20 MG capsule Commonly known as:  PRILOSEC TAKE 1 CAPSULE BY MOUTH ONCE DAILY   ONE TOUCH ULTRA 2 w/Device Kit Use to check blood sugar as advised up to 2 times daily   OneTouch Delica Lancets 33G Misc Use to check blood sugar up to 2 times daily   OneTouch Ultra Test test strip Generic drug: glucose blood USE TO CHECK BLOOD SUGAR UP TO 2 TIMES ADAY AS DIRECTED   polyethylene glycol 17 g packet Commonly known as: MIRALAX  / GLYCOLAX  Take 17 g by mouth daily as needed for moderate constipation or severe constipation.   senna 8.6 MG Tabs tablet Commonly known as: SENOKOT Take 1 tablet (8.6 mg total) by mouth daily.   simvastatin   40 MG tablet Commonly known as: ZOCOR  TAKE 1 TABLET BY MOUTH AT BEDTIME   Vitamin D  50 MCG (2000 UT) Caps Take 2,000 Units by mouth daily.   witch hazel-glycerin pad Commonly known as: TUCKS 1 application  See admin instructions. Apply topically to rectum daily as needed for itching        Allergies:  Allergies  Allergen Reactions   Aspirin Other (See Comments)    Burns stomach   Conray [Iothalamate] Hives    IV dye conray-400   Dye Fdc Red [Red Dye #40 (Allura Red)] Hives   Sulfa Antibiotics Other (See Comments)    Unknown reaction   Sulfasalazine Other (See Comments)    Unknown reaction   Prednisone Other (See Comments)    Indigestion     Family History: Family History  Adopted: Yes  Problem Relation Age of Onset   Breast cancer Neg Hx     Social History:  reports that she has never smoked. She has never used smokeless tobacco. She reports that she does not currently use alcohol. She reports that she does not use drugs.  ROS:                                        Physical Exam: LMP  (LMP Unknown)   Constitutional:  Alert and oriented, No acute distress. HEENT: [place] AT, moist mucus membranes.  Trachea midline, no masses.  Laboratory Data: Lab Results  Component Value Date   WBC 5.9 04/30/2023   HGB 9.6 (L) 04/30/2023   HCT 29.7 (L)  04/30/2023   MCV 94.6 04/30/2023   PLT 321 04/30/2023    Lab Results  Component Value Date   CREATININE 1.32 (H) 04/30/2023    No results found for: "PSA"  No results found for: "TESTOSTERONE"  Lab Results  Component Value Date   HGBA1C 6.7 (H) 04/30/2023    Urinalysis    Component Value Date/Time   COLORURINE YELLOW 02/13/2020 0622   APPEARANCEUR Hazy (A) 03/29/2023 1513   LABSPEC 1.008 02/13/2020 0622   PHURINE 6.0 02/13/2020 0622   GLUCOSEU Negative 03/29/2023 1513   HGBUR NEGATIVE 02/13/2020 0622   BILIRUBINUR Negative 03/29/2023 1513   KETONESUR NEGATIVE 02/13/2020 0622   PROTEINUR Trace (A) 03/29/2023 1513   PROTEINUR NEGATIVE 02/13/2020 0622   UROBILINOGEN Normal 03/29/2017 0000   NITRITE Negative 03/29/2023 1513   NITRITE NEGATIVE 02/13/2020 0622   LEUKOCYTESUR 3+ (A) 03/29/2023 1513   LEUKOCYTESUR NEGATIVE 02/13/2020 0622    Pertinent Imaging:   Assessment & Plan: Reassess in 6 weeks on Myrbetriq 50 mg samples and prescription.  Discussed percutaneous tibial nerve stimulation then if needed  1. Recurrent UTI (Primary)  - Urinalysis, Complete   No follow-ups on file.  Devorah Fonder, MD  [facility] [address] [place], [place] [address] [phone]

## 2023-06-04 ENCOUNTER — Ambulatory Visit: Payer: Self-pay

## 2023-06-04 MED ORDER — NITROFURANTOIN MACROCRYSTAL 100 MG PO CAPS: 100.0000 mg | ORAL_CAPSULE | Freq: Two times a day (BID) | ORAL | 0 refills | Status: AC

## 2023-06-05 LAB — CULTURE, URINE COMPREHENSIVE

## 2023-06-18 ENCOUNTER — Ambulatory Visit: Payer: PPO

## 2023-06-18 DIAGNOSIS — Z0000 Encounter for general adult medical examination without abnormal findings: Secondary | ICD-10-CM

## 2023-06-18 NOTE — Progress Notes (Signed)
 Subjective:   Lisa Mills is a 89 y.o. who presents for a Medicare Wellness preventive visit.  As a reminder, Annual Wellness Visits don't include a physical exam, and some assessments may be limited, especially if this visit is performed virtually. We may recommend an in-person follow-up visit with your provider if needed.  Visit Complete: Virtual I connected with  [name] on 06/18/23 by a audio enabled telemedicine application and verified that I am speaking with the correct person using two identifiers.  Patient Location: Home  Provider Location: Home Office  I discussed the limitations of evaluation and management by telemedicine. The patient expressed understanding and agreed to proceed.  Vital Signs: Because this visit was a virtual/telehealth visit, some criteria may be missing or patient reported. Any vitals not documented were not able to be obtained and vitals that have been documented are patient reported.  VideoDeclined- This patient declined Librarian, academic. Therefore the visit was completed with audio only.  Persons Participating in Visit: Patient.  AWV Questionnaire: No: Patient Medicare AWV questionnaire was not completed prior to this visit.  Cardiac Risk Factors include: advanced age (>21men, >77 women);diabetes mellitus;hypertension;dyslipidemia;sedentary lifestyle     Objective:     Today's Vitals   06/18/23 1252  PainSc: 4    There is no height or weight on file to calculate BMI.     06/18/2023    1:00 PM 11/11/2022    3:35 PM 06/12/2022    1:31 PM 07/22/2021   12:18 AM 07/22/2021   12:00 AM 07/12/2021    8:45 PM 12/06/2020    2:56 PM  Advanced Directives  Does Patient Have a Medical Advance Directive? No Yes No  No No Yes  Type of Furniture conservator/restorer;Living will     Living will  Does patient want to make changes to medical advance directive?       Yes (Inpatient - patient defers  changing a medical advance directive and declines information at this time)  Copy of Healthcare Power of Attorney in Chart?  No - copy requested       Would patient like information on creating a medical advance directive? No - Patient declined  No - Patient declined No - Patient declined  No - Patient declined     Current Medications (verified) Outpatient Encounter Medications as of 06/18/2023  Medication Sig   amLODipine  (NORVASC ) 5 MG tablet TAKE 1 TABLET BY MOUTH ONCE DAILY   Blood Glucose Monitoring Suppl (ONE TOUCH ULTRA 2) w/Device KIT Use to check blood sugar as advised up to 2 times daily   Cephalexin  250 MG tablet Take 1 tablet (250 mg total) by mouth daily in the afternoon. UTI prevention.   Cholecalciferol  (VITAMIN D ) 50 MCG (2000 UT) CAPS Take 2,000 Units by mouth daily.    cyanocobalamin  (VITAMIN B12) 1000 MCG tablet Take 1,000 mcg by mouth daily.   escitalopram  (LEXAPRO ) 10 MG tablet TAKE 1 TABLET BY MOUTH ONCE DAILY WITH FOOD   feeding supplement (ENSURE ENLIVE / ENSURE PLUS) LIQD Take 237 mLs by mouth 2 (two) times daily between meals.   ferrous sulfate  325 (65 FE) MG tablet Take 325 mg by mouth daily.    glycerin adult 2 g suppository Place 1 suppository rectally as needed for constipation.   Histamine Dihydrochloride (AUSTRALIAN DREAM ARTHRITIS) 0.025 % CREA Apply 1 application topically daily as needed (Pain).   levothyroxine  (SYNTHROID ) 25 MCG tablet TAKE 1 TABLET BY MOUTH  ONCE DAILY ON AN EMPTY STOMACH. WAIT 30 MINUTES BEFORE TAKING OTHER MEDS.   lisinopril  (ZESTRIL ) 5 MG tablet Take 1 tablet (5 mg total) by mouth daily.   metFORMIN  (GLUCOPHAGE ) 500 MG tablet Take 1 tablet (500 mg total) by mouth 2 (two) times daily with a meal.   mirabegron  ER (MYRBETRIQ ) 50 MG TB24 tablet Take 1 tablet (50 mg total) by mouth daily.   omeprazole  (PRILOSEC) 20 MG capsule TAKE 1 CAPSULE BY MOUTH ONCE DAILY   OneTouch Delica Lancets 33G MISC Use to check blood sugar up to 2 times daily    ONETOUCH ULTRA TEST test strip USE TO CHECK BLOOD SUGAR UP TO 2 TIMES ADAY AS DIRECTED   polyethylene glycol (MIRALAX  / GLYCOLAX ) 17 g packet Take 17 g by mouth daily as needed for moderate constipation or severe constipation.   senna (SENOKOT) 8.6 MG TABS tablet Take 1 tablet (8.6 mg total) by mouth daily.   simvastatin  (ZOCOR ) 40 MG tablet TAKE 1 TABLET BY MOUTH AT BEDTIME   Vibegron  (GEMTESA ) 75 MG TABS Take 1 tablet (75 mg total) by mouth daily.   witch hazel-glycerin (TUCKS) pad 1 application  See admin instructions. Apply topically to rectum daily as needed for itching   No facility-administered encounter medications on file as of 06/18/2023.    Allergies (verified) Aspirin, Conray [iothalamate], Dye fdc red [red dye #40 (allura red)], Sulfa antibiotics, Sulfasalazine, and Prednisone   History: Past Medical History:  Diagnosis Date   Anemia    Anxiety    Chronic kidney disease    stage 3-4   Constipation    Depression    Dermatophytosis of nail    Diabetes mellitus without complication (HCC)    Diet controlled, lost weight, no meds   GERD (gastroesophageal reflux disease)    Hemorrhoids    History of shingles    HLD (hyperlipidemia)    Hypertension    Hypothyroidism    Keratoderma    Restless legs syndrome (RLS)    hx   Past Surgical History:  Procedure Laterality Date   BREAST BIOPSY Right 09/30/2017   Affirm bx-calcs ( X clip),benign   BREAST EXCISIONAL BIOPSY Right    neg   cataracts     COLONOSCOPY W/ POLYPECTOMY     COLONOSCOPY WITH PROPOFOL  N/A 02/10/2017   Procedure: COLONOSCOPY WITH PROPOFOL ;  Surgeon: Cassie Click, MD;  Location[facility] ENDOSCOPY;  Service: Endoscopy;  Laterality: N/A;   ESOPHAGOGASTRODUODENOSCOPY (EGD) WITH PROPOFOL  N/A 02/10/2017   Procedure: ESOPHAGOGASTRODUODENOSCOPY (EGD) WITH PROPOFOL ;  Surgeon: Cassie Click, MD;  Location[facility] ENDOSCOPY;  Service: Endoscopy;  Laterality: N/A;   EYE SURGERY     bilateral cataracts   HAND  SURGERY     HARDWARE REMOVAL Left 07/23/2021   Procedure: HARDWARE REMOVAL;  Surgeon: Laneta Pintos, MD;  Location: [facility] OR;  Service: Orthopedics;  Laterality: Left;   INTRAMEDULLARY (IM) NAIL INTERTROCHANTERIC Left 07/13/2021   Procedure: INTRAMEDULLARY (IM) NAIL INTERTROCHANTRIC;  Surgeon: Elner Hahn, MD;  Location: [facility] ORS;  Service: Orthopedics;  Laterality: Left;   INTRAMEDULLARY (IM) NAIL INTERTROCHANTERIC Left 07/23/2021   Procedure: INTRAMEDULLARY (IM) NAIL INTERTROCHANTRIC;  Surgeon: Laneta Pintos, MD;  Location: [facility] OR;  Service: Orthopedics;  Laterality: Left;   KIDNEY STONE SURGERY     ORIF FEMUR FRACTURE Left 07/23/2021   Procedure: OPEN REDUCTION INTERNAL FIXATION (ORIF) DISTAL FEMUR FRACTURE;  Surgeon: Laneta Pintos, MD;  Location: [facility] OR;  Service: Orthopedics;  Laterality: Left;   ORIF HUMERUS  FRACTURE Right 02/13/2020   Procedure: OPEN REDUCTION INTERNAL FIXATION (ORIF) SUPRACONDYLAR  HUMERUS FRACTURE;  Surgeon: Hardy Lia, MD;  Location: [facility] OR;  Service: Orthopedics;  Laterality: Right;   SACROPLASTY N/A 07/06/2019   Procedure: SACROPLASTY;  Surgeon: Molli Angelucci, MD;  Location: [facility] ORS;  Service: Orthopedics;  Laterality: N/A;   TONSILECTOMY, ADENOIDECTOMY, BILATERAL MYRINGOTOMY AND TUBES     TONSILLECTOMY     TRIGGER FINGER RELEASE     Left ring finger   tubercular peritonitis     Family History  Adopted: Yes  Problem Relation Age of Onset   Breast cancer Neg Hx    Social History   Socioeconomic History   Marital status: Widowed    Spouse name: Not on file   Number of children: 2   Years of education: Not on file   Highest education level: Some college, no degree  Occupational History   Not on file  Tobacco Use   Smoking status: Never   Smokeless tobacco: Never  Vaping Use   Vaping status: Never Used  Substance and Sexual Activity   Alcohol use: Not Currently   Drug use: No   Sexual activity: Not on file  Other Topics Concern   Not on file   Social History Narrative   Not on file   Social Drivers of Health   Financial Resource Strain: Low Risk  (06/18/2023)   Overall Financial Resource Strain (CARDIA)    Difficulty of Paying Living Expenses: Not hard at all  Food Insecurity: No Food Insecurity (06/18/2023)   Hunger Vital Sign    Worried About Running Out of Food in the Last Year: Never true    Ran Out of Food in the Last Year: Never true  Transportation Needs: No Transportation Needs (06/18/2023)   PRAPARE - Administrator, Civil Service (Medical): No    Lack of Transportation (Non-Medical): No  Physical Activity: Insufficiently Active (06/18/2023)   Exercise Vital Sign    Days of Exercise per Week: 2 days    Minutes of Exercise per Session: 20 min  Stress: No Stress Concern Present (06/18/2023)   [employer] of Occupational Health - Occupational Stress Questionnaire    Feeling of Stress : Not at all  Social Connections: Socially Isolated (06/18/2023)   Social Connection and Isolation Panel [NHANES]    Frequency of Communication with Friends and Family: Twice a week    Frequency of Social Gatherings with Friends and Family: More than three times a week    Attends Religious Services: Never    Database administrator or Organizations: No    Attends Banker Meetings: Never    Marital Status: Widowed    Tobacco Counseling Counseling given: Not Answered    Clinical Intake:  Pre-visit preparation completed: Yes  Pain : 0-10 Pain Score: 4  Pain Type: Chronic pain Pain Location: Hand Pain Orientation: Right Pain Descriptors / Indicators: Aching, Discomfort, Constant, Dull Pain Onset: More than a month ago Pain Frequency: Constant     BMI - recorded: 19.8 Nutritional Status: BMI of 19-24  Normal Nutritional Risks: None Diabetes: Yes CBG done?: No Did pt. bring in CBG monitor from home?: No  Lab Results  Component Value Date   HGBA1C 6.7 (H) 04/30/2023   HGBA1C 6.9 (A)  11/09/2022   HGBA1C 6.8 (H) 04/17/2022     How often do you need to have someone help you when you read instructions, pamphlets, or other written materials from your doctor or  pharmacy?: 1 - Never  Interpreter Needed?: No  Information entered by :: Dellie Fergusson, LPN   Activities of Daily Living    06/18/2023    1:03 PM 11/09/2022    1:46 PM  In your present state of health, do you have any difficulty performing the following activities:  Hearing? 1 1  Vision? 0 0  Difficulty concentrating or making decisions? 0 0  Walking or climbing stairs? 0 0  Dressing or bathing? 0 0  Doing errands, shopping? 0 0  Preparing Food and eating ? N   Using the Toilet? N   In the past six months, have you accidently leaked urine? N   Do you have problems with loss of bowel control? N   Managing your Medications? N   Managing your Finances? N   Housekeeping or managing your Housekeeping? N     Patient Care Team: Raina Bunting, DO as PCP - General (Family Medicine) Talbot Factor, NP (Inactive) as Nurse Practitioner Adriana Albany, LCSW as Social Worker (Licensed Clinical Social Worker) Timmy Forbes, MD as Consulting Physician (Oncology) Pa, [facility] (Optometry)  Indicate any recent Medical Services you may have received from other than [facility] providers in the past year (date may be approximate).     Assessment:    This is a routine wellness examination for [facility].  Hearing/Vision screen Hearing Screening - Comments:: NO AIDS Vision Screening - Comments:: NO GLASSES, HAD CATARACT SGY- [facility]   Goals Addressed             This Visit's Progress    DIET - INCREASE WATER INTAKE         Depression Screen     06/18/2023   12:58 PM 04/30/2023   10:43 AM 11/09/2022    1:45 PM 06/12/2022    1:30 PM 04/17/2022    1:03 PM 04/17/2022    1:02 PM 12/08/2021    3:41 PM  PHQ 2/9 Scores  PHQ - 2 Score 0 0 0 0 0 0 0  PHQ- 9 Score 0 2 0 0 0  1    Fall Risk      06/18/2023    1:03 PM 04/30/2023   10:43 AM 06/12/2022    1:33 PM 12/08/2021    3:41 PM 03/14/2021    1:29 PM  Fall Risk   Falls in the past year? 0 0 1 0 0  Number falls in past yr: 0  1 0 0  Injury with Fall? 0  1 0 0  Risk for fall due to : History of fall(s);Impaired balance/gait  History of fall(s) Impaired balance/gait;Impaired mobility;History of fall(s) Impaired mobility  Follow up Falls evaluation completed  Falls evaluation completed;Falls prevention discussed Falls evaluation completed Falls evaluation completed    MEDICARE RISK AT HOME:  Medicare Risk at Home Any stairs in or around the home?: Yes If so, are there any without handrails?: No Home free of loose throw rugs in walkways, pet beds, electrical cords, etc?: Yes Adequate lighting in your home to reduce risk of falls?: Yes Life alert?: No Use of a cane, walker or w/c?: No (USES WALKING STICK IF OUTSIDE) Grab bars in the bathroom?: Yes Shower chair or bench in shower?: No Elevated toilet seat or a handicapped toilet?: Yes  TIMED UP AND GO:  Was the test performed?  No  Cognitive Function: 6CIT completed        06/18/2023    1:05 PM 06/12/2022    1:38  PM 11/28/2019    4:10 PM 11/01/2018    1:55 PM 10/19/2017    2:55 PM  6CIT Screen  What Year? 0 points 0 points 0 points 0 points 0 points  What month? 3 points 0 points 0 points 0 points 0 points  What time? 0 points 0 points 0 points 0 points 0 points  Count back from 20 0 points 0 points 0 points 0 points 0 points  Months in reverse 0 points 0 points 0 points 0 points 0 points  Repeat phrase 4 points 0 points 2 points 0 points 0 points  Total Score 7 points 0 points 2 points 0 points 0 points    Immunizations Immunization History  Administered Date(s) Administered   Fluad Quad(high Dose 65+) 11/01/2018, 10/25/2020, 12/08/2021   Fluad Trivalent(High Dose 65+) 11/09/2022   Influenza, High Dose Seasonal PF 10/28/2016, 10/19/2017, 12/19/2019    Influenza-Unspecified 10/17/2011, 11/09/2012, 10/30/2013, 11/02/2014, 11/10/2015   PFIZER Comirnaty(Gray Top)Covid-19 Tri-Sucrose Vaccine 12/19/2019   PFIZER(Purple Top)SARS-COV-2 Vaccination 03/26/2019, 04/19/2019, 12/19/2019   PPD Test 07/28/2021, 08/15/2021   Pneumococcal Conjugate-13 12/10/2012   Pneumococcal Polysaccharide-23 08/25/2016   Zoster Recombinant(Shingrix) 12/11/2016, 08/28/2017, 08/28/2017    Screening Tests Health Maintenance  Topic Date Due   DTaP/Tdap/Td (1 - Tdap) Never done   COVID-19 Vaccine (5 - 2024-25 season) 09/20/2022   FOOT EXAM  04/17/2023   INFLUENZA VACCINE  08/20/2023   HEMOGLOBIN A1C  10/30/2023   OPHTHALMOLOGY EXAM  03/22/2024   Medicare Annual Wellness (AWV)  06/17/2024   Pneumonia Vaccine 64+ Years old  Completed   DEXA SCAN  Completed   Zoster Vaccines- Shingrix  Completed   HPV VACCINES  Aged Out   Meningococcal B Vaccine  Aged Out    Health Maintenance  Health Maintenance Due  Topic Date Due   DTaP/Tdap/Td (1 - Tdap) Never done   COVID-19 Vaccine (5 - 2024-25 season) 09/20/2022   FOOT EXAM  04/17/2023   Health Maintenance Items Addressed: NEEDS FOOT EXAM; UP TO DATE ON SHOTS EXCEPT PNA, WANTS NO MORE COVIDS  Additional Screening:  Vision Screening: Recommended annual ophthalmology exams for early detection of glaucoma and other disorders of the eye.  Dental Screening: Recommended annual dental exams for proper oral hygiene  Community Resource Referral / Chronic Care Management: CRR required this visit?  No   CCM required this visit?  No   Plan:    I have personally reviewed and noted the following in the patient's chart:   Medical and social history Use of alcohol, tobacco or illicit drugs  Current medications and supplements including opioid prescriptions. Patient is not currently taking opioid prescriptions. Functional ability and status Nutritional status Physical activity Advanced directives List of other  physicians Hospitalizations, surgeries, and ER visits in previous 12 months Vitals Screenings to include cognitive, depression, and falls Referrals and appointments  In addition, I have reviewed and discussed with patient certain preventive protocols, quality metrics, and best practice recommendations. A written personalized care plan for preventive services as well as general preventive health recommendations were provided to patient.   Pinky Bright, LPN   1/61/0960   After Visit Summary: (MyChart) Due to this being a telephonic visit, the after visit summary with patients personalized plan was offered to patient via MyChart   Notes: Nothing significant to report at this time.

## 2023-06-18 NOTE — Patient Instructions (Addendum)
 Lisa Mills , Thank you for taking time out of your busy schedule to complete your Annual Wellness Visit with me. I enjoyed our conversation and look forward to speaking with you again next year. I, as well as your care team,  appreciate your ongoing commitment to your health goals. Please review the following plan we discussed and let me know if I can assist you in the future.   Follow up Visits: Next Medicare AWV with our clinical staff:   06/23/24 @ 11:30 AM BY PHONE Have you seen your provider in the last 6 months (3 months if uncontrolled diabetes)? Yes  Clinician Recommendations:  Aim for 30 minutes of exercise or brisk walking, 6-8 glasses of water, and 5 servings of fruits and vegetables each day. TAKE CARE!      This is a list of the screening recommended for you and due dates:  Health Maintenance  Topic Date Due   DTaP/Tdap/Td vaccine (1 - Tdap) Never done   COVID-19 Vaccine (5 - 2024-25 season) 09/20/2022   Complete foot exam   04/17/2023   Flu Shot  08/20/2023   Hemoglobin A1C  10/30/2023   Eye exam for diabetics  03/22/2024   Medicare Annual Wellness Visit  06/17/2024   Pneumonia Vaccine  Completed   DEXA scan (bone density measurement)  Completed   Zoster (Shingles) Vaccine  Completed   HPV Vaccine  Aged Out   Meningitis B Vaccine  Aged Out    Advanced directives: (ACP Link)Information on Advanced Care Planning can be found at [place]  [facility] Advance Health Care Directives Advance Health Care Directives. [email or web address]  Advance Care Planning is important because it:  [x]  Makes sure you receive the medical care that is consistent with your values, goals, and preferences  [x]  It provides guidance to your family and loved ones and reduces their decisional burden about whether or not they are making the right decisions based on your wishes.  Follow the link provided in your after visit summary or read over the paperwork we have mailed to you to help you started  getting your Advance Directives in place. If you need assistance in completing these, please reach out to us  so that we can help you!

## 2023-08-09 ENCOUNTER — Other Ambulatory Visit: Payer: Self-pay | Admitting: Family Medicine

## 2023-08-09 DIAGNOSIS — F418 Other specified anxiety disorders: Secondary | ICD-10-CM

## 2023-08-11 NOTE — Telephone Encounter (Signed)
 Rx 05/25/23 #90 1RF- too soon Requested Prescriptions  Pending Prescriptions Disp Refills   escitalopram  (LEXAPRO ) 10 MG tablet [Pharmacy Med Name: ESCITALOPRAM  OXALATE 10 MG TAB] 90 tablet 1    Sig: TAKE 1 TABLET BY MOUTH ONCE DAILY WITH FOOD     Psychiatry:  Antidepressants - SSRI Passed - 08/11/2023 11:23 AM      Passed - Completed PHQ-2 or PHQ-9 in the last 360 days      Passed - Valid encounter within last 6 months    Recent Outpatient Visits           3 months ago Type 2 diabetes mellitus with stage 3a chronic kidney disease, without long-term current use of insulin  [facility])   [place] [facility] [place], Marsa PARAS, DO       Future Appointments             In 1 week MacDiarmid, [place]ia, MD [facility] Urology [facility]

## 2023-08-16 ENCOUNTER — Other Ambulatory Visit: Payer: Self-pay | Admitting: Family Medicine

## 2023-08-16 DIAGNOSIS — I10 Essential (primary) hypertension: Secondary | ICD-10-CM

## 2023-08-17 NOTE — Telephone Encounter (Signed)
 Requested Prescriptions  Pending Prescriptions Disp Refills   amLODipine  (NORVASC ) 5 MG tablet [Pharmacy Med Name: AMLODIPINE  BESYLATE 5 MG TAB] 90 tablet 0    Sig: TAKE 1 TABLET BY MOUTH ONCE DAILY     Cardiovascular: Calcium  Channel Blockers 2 Failed - 08/17/2023  2:01 PM      Failed - Last BP in normal range    BP Readings from Last 1 Encounters:  05/31/23 (!) 145/63         Passed - Last Heart Rate in normal range    Pulse Readings from Last 1 Encounters:  05/31/23 84         Passed - Valid encounter within last 6 months    Recent Outpatient Visits           3 months ago Type 2 diabetes mellitus with stage 3a chronic kidney disease, without long-term current use of insulin  [facility])   [place] [facility] [place], Marsa PARAS, DO       Future Appointments             In 6 days MacDiarmid, Glendia, MD [facility] Urology [facility]

## 2023-08-19 ENCOUNTER — Telehealth: Payer: Self-pay

## 2023-08-19 ENCOUNTER — Other Ambulatory Visit: Payer: Self-pay | Admitting: Family Medicine

## 2023-08-19 ENCOUNTER — Other Ambulatory Visit (HOSPITAL_COMMUNITY): Payer: Self-pay

## 2023-08-19 DIAGNOSIS — Z0000 Encounter for general adult medical examination without abnormal findings: Secondary | ICD-10-CM

## 2023-08-19 DIAGNOSIS — E063 Autoimmune thyroiditis: Secondary | ICD-10-CM

## 2023-08-19 DIAGNOSIS — N1831 Chronic kidney disease, stage 3a: Secondary | ICD-10-CM

## 2023-08-19 DIAGNOSIS — E1122 Type 2 diabetes mellitus with diabetic chronic kidney disease: Secondary | ICD-10-CM

## 2023-08-19 DIAGNOSIS — E538 Deficiency of other specified B group vitamins: Secondary | ICD-10-CM

## 2023-08-19 DIAGNOSIS — E785 Hyperlipidemia, unspecified: Secondary | ICD-10-CM

## 2023-08-19 NOTE — Telephone Encounter (Signed)
 Pharmacy Patient Advocate Encounter   Received notification from CoverMyMeds that prior authorization for amLODIPine  Besylate 5MG  tablets is required/requested.   Insurance verification completed.   The patient is insured through [facility] ADVANTAGE/RX ADVANCE .   Per test claim: The current 90 day co-pay is, $0.00.  No PA needed at this time. This test claim was processed through [facility]- copay amounts may vary at other pharmacies due to pharmacy/plan contracts, or as the patient moves through the different stages of their insurance plan.     *Medication was rejecting due to previously filled simvastatin , pharmacy override codes required.

## 2023-08-23 ENCOUNTER — Ambulatory Visit: Admitting: Urology

## 2023-08-23 ENCOUNTER — Other Ambulatory Visit

## 2023-08-23 DIAGNOSIS — Z0000 Encounter for general adult medical examination without abnormal findings: Secondary | ICD-10-CM | POA: Diagnosis not present

## 2023-08-23 DIAGNOSIS — D631 Anemia in chronic kidney disease: Secondary | ICD-10-CM | POA: Diagnosis not present

## 2023-08-23 DIAGNOSIS — E538 Deficiency of other specified B group vitamins: Secondary | ICD-10-CM | POA: Diagnosis not present

## 2023-08-23 DIAGNOSIS — E1169 Type 2 diabetes mellitus with other specified complication: Secondary | ICD-10-CM | POA: Diagnosis not present

## 2023-08-23 DIAGNOSIS — N183 Chronic kidney disease, stage 3 unspecified: Secondary | ICD-10-CM | POA: Diagnosis not present

## 2023-08-23 DIAGNOSIS — E063 Autoimmune thyroiditis: Secondary | ICD-10-CM | POA: Diagnosis not present

## 2023-08-23 DIAGNOSIS — E1122 Type 2 diabetes mellitus with diabetic chronic kidney disease: Secondary | ICD-10-CM | POA: Diagnosis not present

## 2023-08-23 DIAGNOSIS — E785 Hyperlipidemia, unspecified: Secondary | ICD-10-CM | POA: Diagnosis not present

## 2023-08-23 DIAGNOSIS — N1831 Chronic kidney disease, stage 3a: Secondary | ICD-10-CM | POA: Diagnosis not present

## 2023-08-24 LAB — CBC WITH DIFFERENTIAL/PLATELET
Absolute Lymphocytes: 2607 {cells}/uL (ref 850–3900)
Absolute Monocytes: 547 {cells}/uL (ref 200–950)
Basophils Absolute: 61 {cells}/uL (ref 0–200)
Basophils Relative: 0.8 %
Eosinophils Absolute: 198 {cells}/uL (ref 15–500)
Eosinophils Relative: 2.6 %
HCT: 28.8 % — ABNORMAL LOW (ref 35.0–45.0)
Hemoglobin: 9.4 g/dL — ABNORMAL LOW (ref 11.7–15.5)
MCH: 31.2 pg (ref 27.0–33.0)
MCHC: 32.6 g/dL (ref 32.0–36.0)
MCV: 95.7 fL (ref 80.0–100.0)
MPV: 8.9 fL (ref 7.5–12.5)
Monocytes Relative: 7.2 %
Neutro Abs: 4188 {cells}/uL (ref 1500–7800)
Neutrophils Relative %: 55.1 %
Platelets: 362 Thousand/uL (ref 140–400)
RBC: 3.01 Million/uL — ABNORMAL LOW (ref 3.80–5.10)
RDW: 12.7 % (ref 11.0–15.0)
Total Lymphocyte: 34.3 %
WBC: 7.6 Thousand/uL (ref 3.8–10.8)

## 2023-08-24 LAB — T4, FREE: Free T4: 1.3 ng/dL (ref 0.8–1.8)

## 2023-08-24 LAB — COMPREHENSIVE METABOLIC PANEL WITH GFR
AG Ratio: 1.7 (calc) (ref 1.0–2.5)
ALT: 7 U/L (ref 6–29)
AST: 16 U/L (ref 10–35)
Albumin: 4.5 g/dL (ref 3.6–5.1)
Alkaline phosphatase (APISO): 59 U/L (ref 37–153)
BUN/Creatinine Ratio: 14 (calc) (ref 6–22)
BUN: 17 mg/dL (ref 7–25)
CO2: 28 mmol/L (ref 20–32)
Calcium: 9.9 mg/dL (ref 8.6–10.4)
Chloride: 92 mmol/L — ABNORMAL LOW (ref 98–110)
Creat: 1.19 mg/dL — ABNORMAL HIGH (ref 0.60–0.95)
Globulin: 2.6 g/dL (ref 1.9–3.7)
Glucose, Bld: 116 mg/dL — ABNORMAL HIGH (ref 65–99)
Potassium: 5 mmol/L (ref 3.5–5.3)
Sodium: 128 mmol/L — ABNORMAL LOW (ref 135–146)
Total Bilirubin: 0.3 mg/dL (ref 0.2–1.2)
Total Protein: 7.1 g/dL (ref 6.1–8.1)
eGFR: 43 mL/min/1.73m2 — ABNORMAL LOW (ref >=60)

## 2023-08-24 LAB — LIPID PANEL
Cholesterol: 166 mg/dL (ref <200)
HDL: 85 mg/dL (ref >=50)
LDL Cholesterol (Calc): 61 mg/dL
Non-HDL Cholesterol (Calc): 81 mg/dL (ref <130)
Total CHOL/HDL Ratio: 2 (calc) (ref <5.0)
Triglycerides: 118 mg/dL (ref <150)

## 2023-08-24 LAB — MICROALBUMIN / CREATININE URINE RATIO
Creatinine, Urine: 39 mg/dL (ref 20–275)
Microalb Creat Ratio: 228 mg/g{creat} — ABNORMAL HIGH (ref <30)
Microalb, Ur: 8.9 mg/dL

## 2023-08-24 LAB — TSH: TSH: 7.57 m[IU]/L — ABNORMAL HIGH (ref 0.40–4.50)

## 2023-08-24 LAB — IRON,TIBC AND FERRITIN PANEL
%SAT: 20 % (ref 16–45)
Ferritin: 111 ng/mL (ref 16–288)
Iron: 58 ug/dL (ref 45–160)
TIBC: 292 ug/dL (ref 250–450)

## 2023-08-24 LAB — HEMOGLOBIN A1C
Hgb A1c MFr Bld: 7 % — ABNORMAL HIGH (ref <5.7)
Mean Plasma Glucose: 154 mg/dL
eAG (mmol/L): 8.5 mmol/L

## 2023-08-24 LAB — VITAMIN B12: Vitamin B-12: 1768 pg/mL — ABNORMAL HIGH (ref 200–1100)

## 2023-08-26 ENCOUNTER — Encounter: Payer: Self-pay | Admitting: Urology

## 2023-08-30 ENCOUNTER — Ambulatory Visit: Admitting: Family Medicine

## 2023-08-30 ENCOUNTER — Encounter: Payer: Self-pay | Admitting: Family Medicine

## 2023-08-30 VITALS — BP 110/64 | HR 74 | Ht 61.0 in | Wt 103.5 lb

## 2023-08-30 DIAGNOSIS — N183 Chronic kidney disease, stage 3 unspecified: Secondary | ICD-10-CM

## 2023-08-30 DIAGNOSIS — H6122 Impacted cerumen, left ear: Secondary | ICD-10-CM

## 2023-08-30 DIAGNOSIS — N1831 Chronic kidney disease, stage 3a: Secondary | ICD-10-CM

## 2023-08-30 DIAGNOSIS — Z7984 Long term (current) use of oral hypoglycemic drugs: Secondary | ICD-10-CM

## 2023-08-30 DIAGNOSIS — E1122 Type 2 diabetes mellitus with diabetic chronic kidney disease: Secondary | ICD-10-CM

## 2023-08-30 DIAGNOSIS — Z23 Encounter for immunization: Secondary | ICD-10-CM

## 2023-08-30 DIAGNOSIS — S0083XA Contusion of other part of head, initial encounter: Secondary | ICD-10-CM

## 2023-08-30 DIAGNOSIS — M21331 Wrist drop, right wrist: Secondary | ICD-10-CM

## 2023-08-30 DIAGNOSIS — E063 Autoimmune thyroiditis: Secondary | ICD-10-CM | POA: Diagnosis not present

## 2023-08-30 DIAGNOSIS — R296 Repeated falls: Secondary | ICD-10-CM | POA: Diagnosis not present

## 2023-08-30 NOTE — Patient Instructions (Addendum)
 Thank you for coming to the office today.  Recent Labs    11/09/22 1419 04/30/23 1119 08/23/23 1025  HGBA1C 6.9* 6.7* 7.0*   Referral to our Case Management Nurse Team - [facility] - Jackson Acron, RN - [name], social worker  They will call from [facility] to schedule.  Head CT Scan at hospital They will call to schedule  [facility] at [facility] Address: [address], [place], [place] [address] Phone: [phone]  Debrox Ear Wax Cleaning System - OTC trial - if cannot clear it out, okay to schedule.   Please schedule a Follow-up Appointment to: Return in about 4 months (around 12/30/2023) for 4 month DM A1c.  If you have any other questions or concerns, please feel free to call the office or send a message through MyChart. You may also schedule an earlier appointment if necessary.  Additionally, you may be receiving a survey about your experience at our office within a few days to 1 week by e-mail or mail. We value your feedback.  Marsa Officer, DO [facility], [place]

## 2023-08-30 NOTE — Progress Notes (Signed)
 Subjective:    Patient ID: Lisa Mills, female    DOB: 1930/01/11, 89 y.o.   MRN: [id number]  Lisa Mills is a 89 y.o. female presenting on 08/30/2023 for Medical Management of Chronic Issues, Diabetes, and Fall (recurrent)   HPI  Discussed the use of AI scribe software for clinical note transcription with the patient, who gave verbal consent to proceed.  History of Present Illness   Lisa Mills is a 89 year old female who presents with multiple falls and head injuries.  Recurrent Falls and head trauma - Multiple falls over the past few weeks, with the most severe occurring approximately four to five weeks ago - Loss of consciousness in the bathroom during the most severe fall, resulting in head trauma and minor bleeding from laceration that has resolved. Bleeding resolved without slurred speech or other acute neurological symptoms - Subsequent fall at a grocery store a few days later, resulting in head trauma without bleeding - Transient grogginess after the grocery store fall, resolved enough to ambulate to the car - Increased use of walker since falls. Doing better now. - Decreased walking speed and persistent instability described as feeling like a 'bobblehead' sometimes - Nervousness and instability present, not externally apparent to others  Gait and balance disturbance - Decreased walking speed since recent falls - Persistent instability and nervousness while ambulating - No numbness, tingling, weakness, or pain in the feet  Hearing loss and tinnitus  - Trouble hearing out of the left ear with wax - Considering new hearing aids - Tinnitus present - No recent use of medication for dizziness-  Type 2 Diabetes Glycemic control - Daily morning blood sugar monitoring, typically low values (exact numbers not recalled) - Recent laboratory results: blood sugar 116 mg/dL, hemoglobin J8r 2.9% - No lightheadedness or dizziness upon standing, waits before rising from  seated position  Hematologic and metabolic status - History of anemia with improved and stable iron counts - High vitamin B12 levels - Iron levels within normal range - Sodium levels slightly below average - Thyroid  levels controlled with medication      Type 2 Diabetes CKD IIIa Anemia CKD HTN Followed by Dr Dennise Nephrology   On medication management. No recent changes On Lisinopril  5mg , Amlodipine  5mg  daily On Metformin  500mg  TWICE A DAY, doing well with this No hypoglycemia    UTI Prophylaxis Followed by Urology Dr Gaston Solo on Cephalexin  250mg  daily has plenty of refills   Chronic Left Hip problem S/p Left Hip Fracture          06/18/2023   12:58 PM 04/30/2023   10:43 AM 11/09/2022    1:45 PM  Depression screen PHQ 2/9  Decreased Interest 0 0 0  Down, Depressed, Hopeless 0 0 0  PHQ - 2 Score 0 0 0  Altered sleeping 0 0 0  Tired, decreased energy 0 2 0  Change in appetite 0 0 0  Feeling bad or failure about yourself  0 0 0  Trouble concentrating 0 0 0  Moving slowly or fidgety/restless 0 0 0  Suicidal thoughts 0 0 0  PHQ-9 Score 0 2 0  Difficult doing work/chores Not difficult at all Not difficult at all        04/30/2023   10:43 AM 11/09/2022    1:45 PM 12/08/2021    3:42 PM 03/14/2021    1:29 PM  GAD 7 : Generalized Anxiety Score  Nervous, Anxious, on Edge 1 0 1 1  Control/stop worrying 0 0 0 0  Worry too much - different things 0 0 0 0  Trouble relaxing 1 0 0 0  Restless 0 0 0 0  Easily annoyed or irritable 0 0 0 0  Afraid - awful might happen 0 0 0 0  Total GAD 7 Score 2 0 1 1  Anxiety Difficulty Not difficult at all  Not difficult at all Not difficult at all    Social History   Tobacco Use   Smoking status: Never   Smokeless tobacco: Never  Vaping Use   Vaping status: Never Used  Substance Use Topics   Alcohol use: Not Currently   Drug use: No    Review of Systems Per HPI unless specifically indicated above      Objective:    BP 110/64 (BP Location: Right Arm, Patient Position: Sitting, Cuff Size: Normal)   Pulse 74   Ht 5' 1 (1.549 m)   Wt 103 lb 8 oz (46.9 kg)   LMP  (LMP Unknown)   SpO2 98%   BMI 19.56 kg/m   Wt Readings from Last 3 Encounters:  08/30/23 103 lb 8 oz (46.9 kg)  05/31/23 105 lb (47.6 kg)  04/30/23 103 lb 6 oz (46.9 kg)    Physical Exam Vitals and nursing note reviewed.  Constitutional:      General: She is not in acute distress.    Appearance: She is well-developed. She is not diaphoretic.     Comments: Thin elderly 89 yr female. comfortable, cooperative  HENT:     Head: Normocephalic and atraumatic.     Comments: No evidence of traumatic injury to scalp or head    Right Ear: Tympanic membrane, ear canal and external ear normal. There is no impacted cerumen.     Left Ear: There is impacted cerumen.  Eyes:     General:        Right eye: No discharge.        Left eye: No discharge.     Conjunctiva/sclera: Conjunctivae normal.  Neck:     Thyroid : No thyromegaly.  Cardiovascular:     Rate and Rhythm: Normal rate and regular rhythm.     Heart sounds: Normal heart sounds. No murmur heard. Pulmonary:     Effort: Pulmonary effort is normal. No respiratory distress.     Breath sounds: Normal breath sounds. No wheezing or rales.  Musculoskeletal:        General: Normal range of motion.     Cervical back: Normal range of motion and neck supple.  Lymphadenopathy:     Cervical: No cervical adenopathy.  Skin:    General: Skin is warm and dry.     Findings: No erythema or rash.  Neurological:     Mental Status: She is alert and oriented to person, place, and time.     Comments: She is at her cognitive baseline today  Psychiatric:        Behavior: Behavior normal.     Comments: Well groomed, good eye contact, normal speech and thoughts     Results for orders placed or performed in visit on 08/19/23  Lipid panel   Collection Time: 08/23/23 10:25 AM  Result Value  Ref Range   Cholesterol 166 <200 mg/dL   HDL 85 > OR = 50 mg/dL   Triglycerides 881 <849 mg/dL   LDL Cholesterol (Calc) 61 mg/dL (calc)   Total CHOL/HDL Ratio 2.0 <5.0 (calc)   Non-HDL Cholesterol (Calc) 81 <869 mg/dL (calc)  Hemoglobin A1c   Collection Time: 08/23/23 10:25 AM  Result Value Ref Range   Hgb A1c MFr Bld 7.0 (H) <5.7 %   Mean Plasma Glucose 154 mg/dL   eAG (mmol/L) 8.5 mmol/L  CBC with Differential/Platelet   Collection Time: 08/23/23 10:25 AM  Result Value Ref Range   WBC 7.6 3.8 - 10.8 Thousand/uL   RBC 3.01 (L) 3.80 - 5.10 Million/uL   Hemoglobin 9.4 (L) 11.7 - 15.5 g/dL   HCT 71.1 (L) 64.9 - 54.9 %   MCV 95.7 80.0 - 100.0 fL   MCH 31.2 27.0 - 33.0 pg   MCHC 32.6 32.0 - 36.0 g/dL   RDW 87.2 88.9 - 84.9 %   Platelets 362 140 - 400 Thousand/uL   MPV 8.9 7.5 - 12.5 fL   Neutro Abs 4,188 1,500 - 7,800 cells/uL   Absolute Lymphocytes 2,607 850 - 3,900 cells/uL   Absolute Monocytes 547 200 - 950 cells/uL   Eosinophils Absolute 198 15 - 500 cells/uL   Basophils Absolute 61 0 - 200 cells/uL   Neutrophils Relative % 55.1 %   Total Lymphocyte 34.3 %   Monocytes Relative 7.2 %   Eosinophils Relative 2.6 %   Basophils Relative 0.8 %  TSH   Collection Time: 08/23/23 10:25 AM  Result Value Ref Range   TSH 7.57 (H) 0.40 - 4.50 mIU/L  Comprehensive metabolic panel with GFR   Collection Time: 08/23/23 10:25 AM  Result Value Ref Range   Glucose, Bld 116 (H) 65 - 99 mg/dL   BUN 17 7 - 25 mg/dL   Creat 8.80 (H) 9.39 - 0.95 mg/dL   eGFR 43 (L) > OR = 60 mL/min/1.51m2   BUN/Creatinine Ratio 14 6 - 22 (calc)   Sodium 128 (L) 135 - 146 mmol/L   Potassium 5.0 3.5 - 5.3 mmol/L   Chloride 92 (L) 98 - 110 mmol/L   CO2 28 20 - 32 mmol/L   Calcium  9.9 8.6 - 10.4 mg/dL   Total Protein 7.1 6.1 - 8.1 g/dL   Albumin 4.5 3.6 - 5.1 g/dL   Globulin 2.6 1.9 - 3.7 g/dL (calc)   AG Ratio 1.7 1.0 - 2.5 (calc)   Total Bilirubin 0.3 0.2 - 1.2 mg/dL   Alkaline phosphatase (APISO) 59 37  - 153 U/L   AST 16 10 - 35 U/L   ALT 7 6 - 29 U/L  T4, free   Collection Time: 08/23/23 10:25 AM  Result Value Ref Range   Free T4 1.3 0.8 - 1.8 ng/dL  Iron, TIBC and Ferritin Panel   Collection Time: 08/23/23 10:25 AM  Result Value Ref Range   Iron 58 45 - 160 mcg/dL   TIBC 707 749 - 549 mcg/dL (calc)   %SAT 20 16 - 45 % (calc)   Ferritin 111 16 - 288 ng/mL  Vitamin B12   Collection Time: 08/23/23 10:25 AM  Result Value Ref Range   Vitamin B-12 1,768 (H) 200 - 1,100 pg/mL  Microalbumin / creatinine urine ratio   Collection Time: 08/23/23 10:40 AM  Result Value Ref Range   Creatinine, Urine 39 20 - 275 mg/dL   Microalb, Ur 8.9 mg/dL   Microalb Creat Ratio 228 (H) <30 mg/g creat      Assessment & Plan:   Problem List Items Addressed This Visit     CKD stage 3 due to type 2 diabetes mellitus (HCC)   Relevant Orders   AMB Referral [facility] Care Management   Hypothyroidism  Relevant Orders   AMB Referral [facility] Care Management   Right wrist drop   Relevant Orders   AMB Referral [facility] Care Management   Type 2 diabetes with stage 3 chronic kidney disease GFR 30-59 (HCC) - Primary   Relevant Orders   AMB Referral [facility] Care Management   Other Visit Diagnoses       Recurrent falls       Relevant Orders   CT HEAD WO CONTRAST ( )   AMB Referral [facility] Care Management     Contusion of other part of head, initial encounter       Relevant Orders   CT HEAD WO CONTRAST ( )     Need for Streptococcus pneumoniae vaccination       Relevant Orders   Pneumococcal conjugate vaccine 20-valent (Completed)     Impacted cerumen of left ear         Long term current use of oral hypoglycemic drug            Recurrent falls with head injury Multiple falls with head injuries, seems to have minor contusions to head mostly posterior with accidental falls No urgent medical evaluations following falls. We discussed neurological red flag symptoms or focal deficits and precautions when to go  to ED. I advised I would suggest if any more significant falls, may warrant hospital ED eval if any injuries sustained. We discussed that cannot always predict an internal intracranial bleed if injury sustained symptoms can develop later  She uses walker for mobility now, but has Right Wrist Drop / Weakness focal problem chronic issue, limits her Grip on R side - Order head CT scan as evaluation, non urgent - Refer to VBCI case management for home safety evaluation and fall prevention. - Advise use of walker. - Standard fall precautions, pause when standing up - Instruct to seek immediate medical attention if confusion or neurological symptoms develop.  Impaired hearing, left ear with cerumen impaction Impaired hearing in left ear due to cerumen impaction. - Recommend Debrox ear wax removal kit for left ear. - Advise appointment for ear cleaning if OTC treatment ineffective.  Type 2 diabetes mellitus Blood sugar levels well-controlled, recent A1c 7.0. - Continue current diabetes management plan. - Monitor blood sugar levels regularly. - Symptoms of falls do not seem to be associated with Hypoglycemia, however asked for records of CBG checks  Hypothyroidism, unspecified Some elevated TSH but normal T4 - Continue current thyroid  medication regimen.  Hyperlipidemia, unspecified Cholesterol levels well-controlled and within normal limits.  Hyponatremia Sodium level slightly low at 128 mmol/L. Chronic baseline 128 to 133 - Continue monitoring sodium levels.       Orders Placed This Encounter  Procedures   CT HEAD WO CONTRAST ( )    Standing Status:   Future    Expiration Date:   08/29/2024    Preferred imaging location?:   [facility]   Pneumococcal conjugate vaccine 20-valent   AMB Referral [facility]BCI Care Management    Referral Priority:   Routine    Referral Type:   Consultation    Referral Reason:   Care Coordination    Number of Visits Requested:   1    No orders of  the defined types were placed in this encounter.   Follow up plan: Return in about 4 months (around 12/30/2023) for 4 month DM A1c.   Marsa Officer, DO [facility] 08/30/2023, 1:48 PM

## 2023-09-03 ENCOUNTER — Ambulatory Visit
Admission: RE | Admit: 2023-09-03 | Discharge: 2023-09-03 | Disposition: A | Discharge status: Home / Self Care | Source: Ambulatory Visit | Attending: Family Medicine | Admitting: Family Medicine

## 2023-09-03 DIAGNOSIS — R296 Repeated falls: Secondary | ICD-10-CM | POA: Insufficient documentation

## 2023-09-03 DIAGNOSIS — I6782 Cerebral ischemia: Secondary | ICD-10-CM | POA: Diagnosis not present

## 2023-09-03 DIAGNOSIS — S0083XA Contusion of other part of head, initial encounter: Secondary | ICD-10-CM | POA: Insufficient documentation

## 2023-09-03 DIAGNOSIS — S0990XA Unspecified injury of head, initial encounter: Secondary | ICD-10-CM | POA: Diagnosis not present

## 2023-09-07 ENCOUNTER — Other Ambulatory Visit: Payer: Self-pay | Admitting: Family Medicine

## 2023-09-07 DIAGNOSIS — K219 Gastro-esophageal reflux disease without esophagitis: Secondary | ICD-10-CM

## 2023-09-09 ENCOUNTER — Telehealth: Payer: Self-pay

## 2023-09-09 NOTE — Progress Notes (Signed)
 Care Guide Pharmacy Note  09/09/2023 Name: Lisa Mills MRN: [id number] DOB: Sep 28, 1929  Referred By: Edman Marsa PARAS, DO Reason for referral: Complex Care Management (Outreach to schedule with Pharm d and RNCM )   Lisa Mills is a 89 y.o. year old female who is a primary care patient of Edman Marsa PARAS, DO.  Lisa Mills was referred to the pharmacist for assistance related to: DMII  Successful contact was made with the patient to discuss pharmacy services including being ready for the pharmacist to call at least 5 minutes before the scheduled appointment time and to have medication bottles and any blood pressure readings ready for review. The patient agreed to meet with the pharmacist via telephone visit on (date/time).09/13/2023  [name] , RMA     [place]  [facility], [facility] Guide  Direct Dial: [phone]  Website: [email or web address]

## 2023-09-09 NOTE — Progress Notes (Signed)
 Complex Care Management Note  Care Guide Note 09/09/2023 Name: Lisa Mills MRN: [id number] DOB: [birth year]  Lisa Mills is a 89 y.o. year old female who sees Edman Marsa PARAS, DO for primary care. I reached out to [name] by phone today to offer complex care management services.  [name] was given information about Complex Care Management services today including:   The Complex Care Management services include support from the care team which includes your Nurse Care Manager, Clinical Social Worker, or Pharmacist.  The Complex Care Management team is here to help remove barriers to the health concerns and goals most important to you. Complex Care Management services are voluntary, and the patient may decline or stop services at any time by request to their care team member.   Complex Care Management Consent Status: Patient agreed to services and verbal consent obtained.   Follow up plan:  Telephone appointment with complex care management team member scheduled for:  [facility] 09/21/2023  Encounter Outcome:  Patient Scheduled  [name] , RMA     [name]  [facility], [facility] Guide  Direct Dial: [phone]  Website: [email or web address]

## 2023-09-09 NOTE — Telephone Encounter (Signed)
 Copied from CRM #[id number]. Topic: General - Other >> Sep 08, 2023 11:51 AM Lisa Mills wrote: Reason for CRM: Patient asking about any kind of life alert service that the office might offer.

## 2023-09-09 NOTE — Telephone Encounter (Signed)
 Lisa Mills,  This patient was recently referred to [facility] team, on 08/30/23. I don't see that services have been initiated yet.  When she is contacted can you add a Life Alert service discussion to her list? Typically I instruct patients to reach out to their insurance provider to determine if any or which one is covered or what the cost is and explore that option. I am not familiar with all of the details on each one and my understanding is that it is more of a 3rd party order rather than a physician prescribed order.  Thanks for your help!  Marsa Officer, DO [facility] 09/09/2023, 8:47 AM

## 2023-09-13 ENCOUNTER — Other Ambulatory Visit (INDEPENDENT_AMBULATORY_CARE_PROVIDER_SITE_OTHER): Admitting: Pharmacist

## 2023-09-13 ENCOUNTER — Telehealth: Payer: Self-pay | Admitting: Pharmacist

## 2023-09-13 DIAGNOSIS — I129 Hypertensive chronic kidney disease with stage 1 through stage 4 chronic kidney disease, or unspecified chronic kidney disease: Secondary | ICD-10-CM

## 2023-09-13 DIAGNOSIS — N183 Chronic kidney disease, stage 3 unspecified: Secondary | ICD-10-CM

## 2023-09-13 NOTE — Patient Instructions (Signed)
 Check your blood pressure twice weekly, and any time you have concerning symptoms like headache, chest pain, dizziness, shortness of breath, or vision changes.    To appropriately check your blood pressure, make sure you do the following:  1) Avoid caffeine, exercise, or tobacco products for 30 minutes before checking. Empty your bladder. 2) Sit with your back supported in a flat-backed chair. Rest your arm on something flat (arm of the chair, table, etc). 3) Sit still with your feet flat on the floor, resting, for at least 5 minutes.  4) Check your blood pressure. Take 1-2 readings.  5) Write down these readings and bring with you to any provider appointments.  Bring your home blood pressure machine with you to a provider's office for accuracy comparison at least once a year.   Make sure you take your blood pressure medications before you come to any office visit, even if you were asked to fast for labs.  Sharyle Sia, PharmD, [facility] Clinical Pharmacist [facility] [phone]

## 2023-09-13 NOTE — Telephone Encounter (Signed)
 We will call patient once we have those results.

## 2023-09-13 NOTE — Telephone Encounter (Signed)
 Results are still not available. We will contact her when resulted  Marsa Officer, DO [facility] 09/13/2023, 1:27 PM

## 2023-09-13 NOTE — Progress Notes (Signed)
 Patient states that she had a Head CT on 8/15. Requests call back if/when results available  Thank you!  Sharyle

## 2023-09-13 NOTE — Progress Notes (Signed)
 09/13/2023 Name: Lisa Mills MRN: [id number] DOB: 07/03/1929  Chief Complaint  Patient presents with   Medication Management    NALIA HONEYCUTT is a 89 y.o. year old female who presented for a telephone visit.   They were referred to the pharmacist by their PCP for assistance in managing complex medication management.    Subjective:  Care Team: Primary Care Provider: Edman Marsa PARAS, DO ; Next Scheduled Visit: 01/03/2024 Urologist: Gaston Hamilton, MD  Nephrologist: Dennise Capri, MD; Next Scheduled Visit: 11/01/2023 Nurse Care Manager: Lucian Santana LABOR, RN; Next Scheduled Visit: 09/21/2023  Medication Access/Adherence  Current Pharmacy:  [facility] [place] [place] - [address] [place] [address] Phone: [phone] Fax: [phone]   Patient reports affordability concerns with their medications: No  Patient reports access/transportation concerns to their pharmacy: No  Patient reports adherence concerns with their medications:  No    Reports uses weekly pillbox to organize her medications, which she refills herself once weekly  Reports working on getting setup for a life alert service. Has an appointment with company to setup in September  Hypertension:  Current medications:  - amlodipine  5 mg daily - lisinopril  5 mg daily   Patient has an automated, upper arm home BP cuff Reports has been monitoring at home with the help of her son, but denies writing down/does not recall latest readings  Patient denies hypotensive s/sx including dizziness, lightheadedness.    Current physical activity: active with preparing her own meals. Walks with walker since recent fall   Objective:  Lab Results  Component Value Date   HGBA1C 7.0 (H) 08/23/2023    Lab Results  Component Value Date   CREATININE 1.19 (H) 08/23/2023   BUN 17 08/23/2023   NA 128 (L) 08/23/2023   K 5.0 08/23/2023   CL 92 (L) 08/23/2023   CO2 28 08/23/2023     Lab Results  Component Value Date   CHOL 166 08/23/2023   HDL 85 08/23/2023   LDLCALC 61 08/23/2023   TRIG 118 08/23/2023   CHOLHDL 2.0 08/23/2023   BP Readings from Last 3 Encounters:  08/30/23 110/64  05/31/23 (!) 145/63  04/30/23 (!) 130/58   Pulse Readings from Last 3 Encounters:  08/30/23 74  05/31/23 84  04/30/23 77     Medications Reviewed Today     Reviewed by Alana Sharyle LABOR, RPH-CPP (Pharmacist) on 09/13/23 at 1107  Med List Status: <None>   Medication Order Taking? Sig Documenting Provider Last Dose Status Informant  amLODipine  (NORVASC ) 5 MG tablet [id number] Yes TAKE 1 TABLET BY MOUTH ONCE DAILY Karamalegos, Marsa PARAS, DO  Active   Blood Glucose Monitoring Suppl (ONE TOUCH ULTRA 2) w/Device KIT [id number]  Use to check blood sugar as advised up to 2 times daily Edman Marsa PARAS, DO  Active   Cephalexin  250 MG tablet [id number] Yes Take 1 tablet (250 mg total) by mouth daily in the afternoon. UTI prevention. Gaston Hamilton, MD  Active   Cholecalciferol  (VITAMIN D ) 50 MCG (2000 UT) CAPS [id number] Yes Take 2,000 Units by mouth daily.   Patient taking differently: Take 1,000 Units by mouth daily.   [provider]  Active Nursing Home Medication Administration Guide (MAG)  cyanocobalamin  (VITAMIN B12) 1000 MCG tablet [id number] Yes Take 1,000 mcg by mouth daily. [provider]  Active   escitalopram  (LEXAPRO ) 10 MG tablet [id number] Yes TAKE 1 TABLET BY MOUTH ONCE DAILY WITH FOOD Edman, Marsa PARAS, DO  Active   feeding supplement (ENSURE ENLIVE / ENSURE PLUS) LIQD [id number] Yes Take 237 mLs by mouth 2 (two) times daily between meals.  Patient taking differently: Take 237 mLs by mouth 2 (two) times daily between meals. As needed   Drusilla Sabas RAMAN, MD  Active   ferrous sulfate  325 (65 FE) MG tablet [id number] Yes Take 325 mg by mouth daily.  [provider]  Active Nursing Home Medication Administration Guide (MAG)    Patient not taking:   Discontinued 09/13/23 1105 (Patient Preference) Histamine Dihydrochloride (AUSTRALIAN DREAM ARTHRITIS) 0.025 % CREA [id number] Yes Apply 1 application topically daily as needed (Pain). [provider]  Active Nursing Home Medication Administration Guide (MAG)  levothyroxine  (SYNTHROID ) 25 MCG tablet [id number] Yes TAKE 1 TABLET BY MOUTH ONCE DAILY ON AN EMPTY STOMACH. WAIT 30 MINUTES BEFORE TAKING OTHER MEDS. Edman Marsa PARAS, DO  Active   lisinopril  (ZESTRIL ) 5 MG tablet [id number] Yes Take 1 tablet (5 mg total) by mouth daily. Edman Marsa PARAS, DO  Active   metFORMIN  (GLUCOPHAGE ) 500 MG tablet [id number] Yes Take 1 tablet (500 mg total) by mouth 2 (two) times daily with a meal. Edman Marsa PARAS, DO  Active    Patient not taking:   Discontinued 09/13/23 1102 (Patient Preference)   omeprazole  (PRILOSEC) 20 MG capsule [id number] Yes TAKE 1 CAPSULE BY MOUTH ONCE DAILY Edman, Marsa PARAS, DO  Active   OneTouch Delica Lancets 33G MISC [id number]  Use to check blood sugar up to 2 times daily Edman Marsa PARAS, DO  Active   [facility] ULTRA TEST test strip [id number]  USE TO CHECK BLOOD SUGAR UP TO 2 TIMES ADAY AS DIRECTED Edman, Marsa PARAS, DO  Active   polyethylene glycol (MIRALAX  / GLYCOLAX ) 17 g packet [id number] Yes Take 17 g by mouth daily as needed for moderate constipation or severe constipation. Swayze, Ava, DO  Active Nursing Home Medication Administration Guide (MAG)  senna (SENOKOT) 8.6 MG TABS tablet [id number]  Take 1 tablet (8.6 mg total) by mouth daily.  Patient not taking: Reported on 09/13/2023   Kandis Devaughn Sayres, MD  Active Nursing Home Medication Administration Guide (MAG)  simvastatin  (ZOCOR ) 40 MG tablet [id number] Yes TAKE 1 TABLET BY MOUTH AT BEDTIME Edman Marsa PARAS, DO  Active    Patient not taking:   Discontinued 09/13/23 1104 (Patient Preference)   witch hazel-glycerin (TUCKS) pad [id number]  1 application  See  admin instructions. Apply topically to rectum daily as needed for itching [provider]  Active Nursing Home Medication Administration Guide (MAG)  Med List Note Mylo Powell CROME, CPhT 07/22/21 1053): Resident of [employer] and [place] [phone]              Assessment/Plan:   Comprehensive medication review performed; medication list updated in electronic medical record - Reports stopped taking Myrbetriq  as did not find it to be effective for her urinary symptoms  Send message to PCP/office as patient requesting results from her head CT that was taken on 09/03/2023  Encourage patient to continue to use walker  Encourage patient to contact Urology office to reschedule missed appointment  Hypertension: - Currently controlled - Reviewed long term cardiovascular and renal outcomes of uncontrolled blood pressure - Reviewed appropriate blood pressure monitoring technique and reviewed goal blood pressure. - Recommended to check home blood pressure and heart rate, keep log of results and have this record to review at upcoming medical appointments. Patient to contact provider office sooner if needed for readings  outside of established parameters or symptoms   Follow Up Plan:   Patient denies further medication questions or concerns today Provide patient with contact information for clinic pharmacist to contact if needed in future for medication questions/concerns   Sharyle Sia, PharmD, JAQUELINE, CPP Clinical Pharmacist [facility] [phone]

## 2023-09-14 ENCOUNTER — Telehealth: Payer: Self-pay | Admitting: Urology

## 2023-09-14 NOTE — Telephone Encounter (Signed)
 Patient called this morning because she realized she missed her appointment on 08/23/23 with Dr. Gaston. She rescheduled for his next available afternoon appt on 12/06/23, and was added to wait list. She wanted to let him know that she only took the Myrbetriq  for 2 months because it wasn't working. She is wanting to know if she should start taking it again, or is there something else he wants her to try. Please advise patient.

## 2023-09-15 ENCOUNTER — Ambulatory Visit: Payer: Self-pay | Admitting: Family Medicine

## 2023-09-16 NOTE — Telephone Encounter (Signed)
 Appt made with PA to discuss other options, pt states she has tried other medication and none seem to help.

## 2023-09-21 ENCOUNTER — Other Ambulatory Visit: Payer: Self-pay

## 2023-09-22 ENCOUNTER — Ambulatory Visit: Admitting: Physician Assistant

## 2023-09-22 VITALS — BP 137/61 | HR 81 | Ht 59.0 in | Wt 104.0 lb

## 2023-09-22 DIAGNOSIS — R351 Nocturia: Secondary | ICD-10-CM

## 2023-09-22 DIAGNOSIS — N390 Urinary tract infection, site not specified: Secondary | ICD-10-CM

## 2023-09-22 MED ORDER — CEPHALEXIN 250 MG PO TABS: 250.0000 mg | ORAL_TABLET | Freq: Every day | ORAL | 3 refills | Status: AC

## 2023-09-22 NOTE — Progress Notes (Signed)
 09/22/2023 1:26 PM   Lisa Mills [birth year] [id number]  CC: Chief Complaint  Patient presents with   Follow-up   Establish Care   HPI: Lisa Mills is a 89 y.o. female with PMH recurrent UTI on suppressive Keflex , nocturia, and urinary incontinence who failed Gemtesa  who presents today for follow-up on Myrbetriq .  She is accompanied today by her son, Charlena, who contributes to HPI.  Today she reports she took Myrbetriq  for 2 months and it did not help, so she stopped it.  She remains primarily bothered by nocturia and nighttime urge incontinence.  She has constipation, dry mouth, and dry eye at baseline.  She denies bothersome daytime symptoms.  She has been very prone to falls lately and is afraid to leave the house for frequent appointments.  PMH: Past Medical History:  Diagnosis Date   Anemia    Anxiety    Chronic kidney disease    stage 3-4   Constipation    Depression    Dermatophytosis of nail    Diabetes mellitus without complication (HCC)    Diet controlled, lost weight, no meds   GERD (gastroesophageal reflux disease)    Hemorrhoids    History of shingles    HLD (hyperlipidemia)    Hypertension    Hypothyroidism    Keratoderma    Restless legs syndrome (RLS)    hx    Surgical History: Past Surgical History:  Procedure Laterality Date   BREAST BIOPSY Right 09/30/2017   Affirm bx-calcs ( X clip),benign   BREAST EXCISIONAL BIOPSY Right    neg   cataracts     COLONOSCOPY W/ POLYPECTOMY     COLONOSCOPY WITH PROPOFOL  N/A 02/10/2017   Procedure: COLONOSCOPY WITH PROPOFOL ;  Surgeon: Viktoria Lamar DASEN, MD;  Location: [facility] ENDOSCOPY;  Service: Endoscopy;  Laterality: N/A;   ESOPHAGOGASTRODUODENOSCOPY (EGD) WITH PROPOFOL  N/A 02/10/2017   Procedure: ESOPHAGOGASTRODUODENOSCOPY (EGD) WITH PROPOFOL ;  Surgeon: Viktoria Lamar DASEN, MD;  Location: [facility] ENDOSCOPY;  Service: Endoscopy;  Laterality: N/A;   EYE SURGERY     bilateral cataracts   HAND SURGERY     HARDWARE  REMOVAL Left 07/23/2021   Procedure: HARDWARE REMOVAL;  Surgeon: Kendal Franky SQUIBB, MD;  Location: [facility] OR;  Service: Orthopedics;  Laterality: Left;   INTRAMEDULLARY (IM) NAIL INTERTROCHANTERIC Left 07/13/2021   Procedure: INTRAMEDULLARY (IM) NAIL INTERTROCHANTRIC;  Surgeon: Edie Norleen PARAS, MD;  Location: [facility] ORS;  Service: Orthopedics;  Laterality: Left;   INTRAMEDULLARY (IM) NAIL INTERTROCHANTERIC Left 07/23/2021   Procedure: INTRAMEDULLARY (IM) NAIL INTERTROCHANTRIC;  Surgeon: Kendal Franky SQUIBB, MD;  Location: [facility] OR;  Service: Orthopedics;  Laterality: Left;   KIDNEY STONE SURGERY     ORIF FEMUR FRACTURE Left 07/23/2021   Procedure: OPEN REDUCTION INTERNAL FIXATION (ORIF) DISTAL FEMUR FRACTURE;  Surgeon: Kendal Franky SQUIBB, MD;  Location: [facility] OR;  Service: Orthopedics;  Laterality: Left;   ORIF HUMERUS FRACTURE Right 02/13/2020   Procedure: OPEN REDUCTION INTERNAL FIXATION (ORIF) SUPRACONDYLAR  HUMERUS FRACTURE;  Surgeon: Celena Sharper, MD;  Location: [facility] OR;  Service: Orthopedics;  Laterality: Right;   SACROPLASTY N/A 07/06/2019   Procedure: SACROPLASTY;  Surgeon: Kathlynn Sharper, MD;  Location: [facility] ORS;  Service: Orthopedics;  Laterality: N/A;   TONSILECTOMY, ADENOIDECTOMY, BILATERAL MYRINGOTOMY AND TUBES     TONSILLECTOMY     TRIGGER FINGER RELEASE     Left ring finger   tubercular peritonitis      Home Medications:  Allergies as of 09/22/2023       Reactions  Aspirin Other (See Comments)   Burns stomach   Conray [iothalamate] Hives   IV dye conray-400   Dye Fdc Red [red Dye #40 (allura Red)] Hives   Sulfa Antibiotics Other (See Comments)   Unknown reaction   Sulfasalazine Other (See Comments)   Unknown reaction   Prednisone Other (See Comments)   Indigestion         Medication List        Accurate as of September 22, 2023  1:26 PM. If you have any questions, ask your nurse or doctor.          amLODipine  5 MG tablet Commonly known as: NORVASC  TAKE 1 TABLET BY MOUTH ONCE DAILY    [place] Dream Arthritis 0.025 % Crea Generic drug: Histamine Dihydrochloride Apply 1 application topically daily as needed (Pain).   Cephalexin  250 MG tablet Take 1 tablet (250 mg total) by mouth daily in the afternoon. UTI prevention.   cyanocobalamin  1000 MCG tablet Commonly known as: VITAMIN B12 Take 1,000 mcg by mouth daily.   escitalopram  10 MG tablet Commonly known as: LEXAPRO  TAKE 1 TABLET BY MOUTH ONCE DAILY WITH FOOD   feeding supplement Liqd Take 237 mLs by mouth 2 (two) times daily between meals. What changed: additional instructions   ferrous sulfate  325 (65 FE) MG tablet Take 325 mg by mouth daily.   levothyroxine  25 MCG tablet Commonly known as: SYNTHROID  TAKE 1 TABLET BY MOUTH ONCE DAILY ON AN EMPTY STOMACH. WAIT 30 MINUTES BEFORE TAKING OTHER MEDS.   lisinopril  5 MG tablet Commonly known as: ZESTRIL  Take 1 tablet (5 mg total) by mouth daily.   metFORMIN  500 MG tablet Commonly known as: GLUCOPHAGE  Take 1 tablet (500 mg total) by mouth 2 (two) times daily with a meal.   omeprazole  20 MG capsule Commonly known as: PRILOSEC TAKE 1 CAPSULE BY MOUTH ONCE DAILY   ONE TOUCH ULTRA 2 w/Device Kit Use to check blood sugar as advised up to 2 times daily   OneTouch Delica Lancets 33G Misc Use to check blood sugar up to 2 times daily   OneTouch Ultra Test test strip Generic drug: glucose blood USE TO CHECK BLOOD SUGAR UP TO 2 TIMES ADAY AS DIRECTED   polyethylene glycol 17 g packet Commonly known as: MIRALAX  / GLYCOLAX  Take 17 g by mouth daily as needed for moderate constipation or severe constipation.   senna 8.6 MG Tabs tablet Commonly known as: SENOKOT Take 1 tablet (8.6 mg total) by mouth daily.   simvastatin  40 MG tablet Commonly known as: ZOCOR  TAKE 1 TABLET BY MOUTH AT BEDTIME   Vitamin D  50 MCG (2000 UT) Caps Take 2,000 Units by mouth daily. What changed: how much to take   witch hazel-glycerin pad Commonly known as: TUCKS 1 application   See admin instructions. Apply topically to rectum daily as needed for itching        Allergies:  Allergies  Allergen Reactions   Aspirin Other (See Comments)    Burns stomach   Conray [Iothalamate] Hives    IV dye conray-400   Dye Fdc Red [Red Dye #40 (Allura Red)] Hives   Sulfa Antibiotics Other (See Comments)    Unknown reaction   Sulfasalazine Other (See Comments)    Unknown reaction   Prednisone Other (See Comments)    Indigestion     Family History: Family History  Adopted: Yes  Problem Relation Age of Onset   Breast cancer Neg Hx     Social History:  reports that she has never smoked. She has never used smokeless tobacco. She reports that she does not currently use alcohol. She reports that she does not use drugs.  Physical Exam: BP 137/61 (BP Location: Left Arm, Patient Position: Sitting, Cuff Size: Normal)   Pulse 81   Ht 4' 11 (1.499 m)   Wt 104 lb (47.2 kg)   LMP  (LMP Unknown)   SpO2 98%   BMI 21.01 kg/m   Constitutional:  Alert and oriented, no acute distress, nontoxic appearing HEENT: [place], AT Cardiovascular: No clubbing, cyanosis, or edema Respiratory: Normal respiratory effort, no increased work of breathing Skin: No rashes, bruises or suspicious lesions Neurologic: Grossly intact, no focal deficits, moving all 4 extremities Psychiatric: Normal mood and affect  Assessment & Plan:   1. Nocturia (Primary) She has failed beta 3 agonists.  Antimuscarinics are contraindicated due to baseline anticholinergic side effects.  We discussed PTNS, however she does not wish to come to clinic that often and would rather live with her symptoms.  We did discuss PureWick to help manage her overnight symptoms, however this has not worked well for her during prior hospitalizations.  2. Recurrent UTI Okay to continue daily suppressive Keflex . - Cephalexin  250 MG tablet; Take 1 tablet (250 mg total) by mouth daily in the afternoon. UTI prevention.  Dispense: 90  tablet; Refill: 3   Return in about 1 year (around 09/21/2024) for Annual rUTI follow up with Dr. Gaston.  Lucie Hones, PA-C  [facility] [place] [address] [place], [place] [address] [phone]

## 2023-10-07 ENCOUNTER — Other Ambulatory Visit: Payer: Self-pay

## 2023-10-07 NOTE — Patient Outreach (Signed)
 Complex Care Management   Visit Note  10/07/2023  Name:  Lisa Mills MRN: [id number] DOB: 1929-08-22  Situation: Referral received for Complex Care Management related to {Criteria:32550} I obtained verbal consent from {CHL AMB Patient/Caregiver:28184}.  Visit completed with {CHL AMB Patient/Caregiver:28184}  {VISIT LOCATION:32553}  Background:   Past Medical History:  Diagnosis Date   Anemia    Anxiety    Chronic kidney disease    stage 3-4   Constipation    Depression    Dermatophytosis of nail    Diabetes mellitus without complication (HCC)    Diet controlled, lost weight, no meds   GERD (gastroesophageal reflux disease)    Hemorrhoids    History of shingles    HLD (hyperlipidemia)    Hypertension    Hypothyroidism    Keratoderma    Restless legs syndrome (RLS)    hx    Assessment: Patient Reported Symptoms:  Cognitive Cognitive Status: Alert and oriented to person, place, and time, Insightful and able to interpret abstract concepts, Normal speech and language skills, No symptoms reported      Neurological Neurological Review of Symptoms: No symptoms reported    HEENT HEENT Symptoms Reported: No symptoms reported      Cardiovascular Cardiovascular Symptoms Reported: No symptoms reported    Respiratory Respiratory Symptoms Reported: No symptoms reported    Endocrine Endocrine Symptoms Reported: Not assessed    Gastrointestinal Gastrointestinal Symptoms Reported: Not assessed      Genitourinary Genitourinary Symptoms Reported: Not assessed    Integumentary Integumentary Symptoms Reported: No symptoms reported    Musculoskeletal Musculoskelatal Symptoms Reviewed: Unsteady gait Musculoskeletal Comment: She is signed up for MedAlert personal alarm with [facility]. No falls since last contact.      Psychosocial Psychosocial Symptoms Reported: Not assessed          10/07/2023    PHQ2-9 Depression Screening   Little interest or pleasure in doing things     Feeling down, depressed, or hopeless    PHQ-2 - Total Score    Trouble falling or staying asleep, or sleeping too much    Feeling tired or having little energy    Poor appetite or overeating     Feeling bad about yourself - or that you are a failure or have let yourself or your family down    Trouble concentrating on things, such as reading the newspaper or watching television    Moving or speaking so slowly that other people could have noticed.  Or the opposite - being so fidgety or restless that you have been moving around a lot more than usual    Thoughts that you would be better off dead, or hurting yourself in some way    PHQ2-9 Total Score    If you checked off any problems, how difficult have these problems made it for you to do your work, take care of things at home, or get along with other people    Depression Interventions/Treatment      There were no vitals filed for this visit.  Medications Reviewed Today   Medications were not reviewed in this encounter     Recommendation:   {RECOMMENDATONS:32554}  Follow Up Plan:   {FOLLOWUP:32559}  SIG ***

## 2023-10-08 NOTE — Patient Instructions (Signed)
 Visit Information  Thank you for taking time to visit with me today. Please don't hesitate to contact me if I can be of assistance to you.    As per your request, no further scheduled appointments.  - patient has requested case closure, declines further phone calls for now.   Please call the care guide team at [phone] if you need to cancel, schedule, or reschedule an appointment.   A reminder to ALL patients/family/friends, please call the [place]  National Suicide Prevention Lifeline: (506)772-0405 or TTY: (940) 858-5034 TTY 9255659399) to talk to a trained counselor if you are experiencing a Mental Health or Behavioral Health Crisis or need someone to talk to.  Santana Stamp BSN, CCM [place]  VBCI Population Health RN Care Manager Direct Dial: [phone]  Fax: [phone]

## 2023-11-01 DIAGNOSIS — E871 Hypo-osmolality and hyponatremia: Secondary | ICD-10-CM | POA: Diagnosis not present

## 2023-11-01 DIAGNOSIS — E1122 Type 2 diabetes mellitus with diabetic chronic kidney disease: Secondary | ICD-10-CM | POA: Diagnosis not present

## 2023-11-01 DIAGNOSIS — D631 Anemia in chronic kidney disease: Secondary | ICD-10-CM | POA: Diagnosis not present

## 2023-11-01 DIAGNOSIS — N1832 Chronic kidney disease, stage 3b: Secondary | ICD-10-CM | POA: Diagnosis not present

## 2023-11-01 DIAGNOSIS — N189 Chronic kidney disease, unspecified: Secondary | ICD-10-CM | POA: Diagnosis not present

## 2023-11-08 ENCOUNTER — Other Ambulatory Visit: Payer: Self-pay | Admitting: Family Medicine

## 2023-11-08 DIAGNOSIS — F418 Other specified anxiety disorders: Secondary | ICD-10-CM

## 2023-11-10 NOTE — Telephone Encounter (Signed)
 Requested Prescriptions  Pending Prescriptions Disp Refills   escitalopram  (LEXAPRO ) 10 MG tablet [Pharmacy Med Name: ESCITALOPRAM  OXALATE 10 MG TAB] 90 tablet 0    Sig: TAKE 1 TABLET BY MOUTH ONCE DAILY WITH FOOD     Psychiatry:  Antidepressants - SSRI Passed - 11/10/2023 12:57 PM      Passed - Completed PHQ-2 or PHQ-9 in the last 360 days      Passed - Valid encounter within last 6 months    Recent Outpatient Visits           2 months ago Type 2 diabetes mellitus with stage 3a chronic kidney disease, without long-term current use of insulin  [facility]   [place] [facility] [place], Marsa PARAS, DO   6 months ago Type 2 diabetes mellitus with stage 3a chronic kidney disease, without long-term current use of insulin  [facility]   [place] [facility] [place], Marsa PARAS, DO       Future Appointments             In 10 months MacDiarmid, [place]ia, MD [facility]

## 2023-11-15 ENCOUNTER — Other Ambulatory Visit: Payer: Self-pay | Admitting: Family Medicine

## 2023-11-15 DIAGNOSIS — I10 Essential (primary) hypertension: Secondary | ICD-10-CM

## 2023-11-16 NOTE — Telephone Encounter (Signed)
 Requested Prescriptions  Pending Prescriptions Disp Refills   amLODipine  (NORVASC ) 5 MG tablet [Pharmacy Med Name: AMLODIPINE  BESYLATE 5 MG TAB] 90 tablet 0    Sig: TAKE 1 TABLET BY MOUTH ONCE DAILY     Cardiovascular: Calcium  Channel Blockers 2 Passed - 11/16/2023  2:10 PM      Passed - Last BP in normal range    BP Readings from Last 1 Encounters:  09/22/23 137/61         Passed - Last Heart Rate in normal range    Pulse Readings from Last 1 Encounters:  09/22/23 81         Passed - Valid encounter within last 6 months    Recent Outpatient Visits           2 months ago Type 2 diabetes mellitus with stage 3a chronic kidney disease, without long-term current use of insulin  [facility])   [place] [facility] [place], Marsa PARAS, DO   6 months ago Type 2 diabetes mellitus with stage 3a chronic kidney disease, without long-term current use of insulin  [facility])   [place] [facility] [place], Marsa PARAS, DO       Future Appointments             In 10 months MacDiarmid, Glendia, MD [facility]

## 2023-12-06 ENCOUNTER — Ambulatory Visit: Admitting: Urology

## 2023-12-06 ENCOUNTER — Other Ambulatory Visit: Payer: Self-pay | Admitting: Family Medicine

## 2023-12-06 DIAGNOSIS — K219 Gastro-esophageal reflux disease without esophagitis: Secondary | ICD-10-CM

## 2023-12-09 NOTE — Telephone Encounter (Signed)
 Requested Prescriptions  Pending Prescriptions Disp Refills   omeprazole  (PRILOSEC) 20 MG capsule [Pharmacy Med Name: OMEPRAZOLE  DR 20 MG CAP] 90 capsule 0    Sig: TAKE 1 CAPSULE BY MOUTH ONCE DAILY     Gastroenterology: Proton Pump Inhibitors Passed - 12/09/2023 12:29 PM      Passed - Valid encounter within last 12 months    Recent Outpatient Visits           3 months ago Type 2 diabetes mellitus with stage 3a chronic kidney disease, without long-term current use of insulin  [facility])   [place] [facility] [place], Marsa PARAS, DO   7 months ago Type 2 diabetes mellitus with stage 3a chronic kidney disease, without long-term current use of insulin  [facility])   [place] [facility] [place], Marsa PARAS, DO       Future Appointments             In 9 months MacDiarmid, Glendia, MD [facility]

## 2024-01-03 ENCOUNTER — Encounter: Payer: Self-pay | Admitting: Family Medicine

## 2024-01-03 ENCOUNTER — Ambulatory Visit: Admitting: Family Medicine

## 2024-01-03 VITALS — BP 122/70 | HR 74 | Ht 59.0 in | Wt 105.4 lb

## 2024-01-03 DIAGNOSIS — E1122 Type 2 diabetes mellitus with diabetic chronic kidney disease: Secondary | ICD-10-CM

## 2024-01-03 DIAGNOSIS — I129 Hypertensive chronic kidney disease with stage 1 through stage 4 chronic kidney disease, or unspecified chronic kidney disease: Secondary | ICD-10-CM

## 2024-01-03 DIAGNOSIS — Z23 Encounter for immunization: Secondary | ICD-10-CM

## 2024-01-03 DIAGNOSIS — F418 Other specified anxiety disorders: Secondary | ICD-10-CM

## 2024-01-03 DIAGNOSIS — K219 Gastro-esophageal reflux disease without esophagitis: Secondary | ICD-10-CM | POA: Diagnosis not present

## 2024-01-03 DIAGNOSIS — N183 Chronic kidney disease, stage 3 unspecified: Secondary | ICD-10-CM | POA: Diagnosis not present

## 2024-01-03 DIAGNOSIS — N1832 Chronic kidney disease, stage 3b: Secondary | ICD-10-CM

## 2024-01-03 DIAGNOSIS — D631 Anemia in chronic kidney disease: Secondary | ICD-10-CM | POA: Diagnosis not present

## 2024-01-03 DIAGNOSIS — Z7984 Long term (current) use of oral hypoglycemic drugs: Secondary | ICD-10-CM | POA: Diagnosis not present

## 2024-01-03 DIAGNOSIS — I10 Essential (primary) hypertension: Secondary | ICD-10-CM

## 2024-01-03 DIAGNOSIS — E063 Autoimmune thyroiditis: Secondary | ICD-10-CM

## 2024-01-03 LAB — POCT GLYCOSYLATED HEMOGLOBIN (HGB A1C): Hemoglobin A1C: 6 % — AB (ref 4.0–5.6)

## 2024-01-03 MED ORDER — AMLODIPINE BESYLATE 5 MG PO TABS: 5.0000 mg | ORAL_TABLET | Freq: Every day | ORAL | 3 refills | Status: AC

## 2024-01-03 MED ORDER — OMEPRAZOLE 20 MG PO CPDR: 20.0000 mg | DELAYED_RELEASE_CAPSULE | Freq: Every day | ORAL | 3 refills | Status: AC

## 2024-01-03 MED ORDER — ESCITALOPRAM OXALATE 10 MG PO TABS: 10.0000 mg | ORAL_TABLET | Freq: Every day | ORAL | 3 refills | Status: AC

## 2024-01-03 NOTE — Progress Notes (Unsigned)
 Subjective:    Patient ID: Lisa Mills, female    DOB: [birth year], 89 y.o.   MRN: [id number]  Lisa Mills is a 89 y.o. female presenting on 01/03/2024 for Diabetes, Hypertension, and Chronic Kidney Disease   HPI  Discussed the use of AI scribe software for clinical note transcription with the patient, who gave verbal consent to proceed.  History of Present Illness   Lisa Mills is a 89 year old female who presents for a follow-up on her blood sugar and medication management. She is accompanied by Charlena, her son  Dizziness and orthostatic symptoms - Experiences occasional dizziness after taking morning medications with breakfast. - Manages dizziness by sitting down until symptoms resolve. - Wakes frequently at night and stands for a minute before walking to prevent dizziness. - Takes three medications that may contribute to dizziness.  History of Syncope and falls - History of sudden loss of consciousness resulting in a fall in the tub and head injury. - No recollection of the fall; describes the event as sudden and without warning. - Obtained a life alert system for safety; has accidentally activated it several times.  Chronic pain / Osteoarthritis - Chronic pain in knees, previously fractured leg, and occasionally in the head. - Uses a heating pad for pain relief. - Considering acetaminophen  for pain management but expresses concern about potential hepatic effects due to prior use.  Nutritional status and anemia - Actively maintains weight between 105 and 110 pounds and monitors nutritional intake.      Occasionally feel dizzy after medications sit for a little while and then pause and eventually get up If wake up overnight, she will stand and pause for a minute to avoid dizziness  osteoarthritis chronic pain Asking about taking Tylenol  regularly or AS NEEDED      Type 2 Diabetes Glycemic control - Daily morning blood sugar monitoring, typically low values  (exact numbers not recalled) - Recent laboratory results: blood sugar 116 mg/dL, hemoglobin J8r 2.9% - No lightheadedness or dizziness upon standing, waits before rising from seated position   Hematologic and metabolic status - History of anemia with improved and stable iron counts - High vitamin B12 levels - Iron levels within normal range - Sodium levels slightly below average - Thyroid  levels controlled with medication      CKD IIIa Anemia CKD HTN Followed by Dr Dennise [facility]   On medication management. No recent changes On Lisinopril  5mg , Amlodipine  5mg  daily On Metformin  500mg  TWICE A DAY, doing well with this No hypoglycemia    UTI Prophylaxis Followed by Urology Dr Gaston Solo on Cephalexin  250mg  daily has plenty of refills   Chronic Left Hip problem S/p Left Hip Fracture   Health Maintenance: Flu Shot     01/03/2024    2:29 PM 09/21/2023    1:40 PM 06/18/2023   12:58 PM  Depression screen PHQ 2/9  Decreased Interest 0 0 0  Down, Depressed, Hopeless 0 0 0  PHQ - 2 Score 0 0 0  Altered sleeping   0  Tired, decreased energy 1  0  Change in appetite 1  0  Feeling bad or failure about yourself  0  0  Trouble concentrating 0  0  Moving slowly or fidgety/restless 0  0  Suicidal thoughts 0  0  PHQ-9 Score   0   Difficult doing work/chores Not difficult at all  Not difficult at all     Data saved with a previous flowsheet  row definition       01/03/2024    2:29 PM 09/21/2023    1:40 PM 04/30/2023   10:43 AM 11/09/2022    1:45 PM  GAD 7 : Generalized Anxiety Score  Nervous, Anxious, on Edge  1 1 0  Control/stop worrying 0 0 0 0  Worry too much - different things 0 0 0 0  Trouble relaxing 0 1 1 0  Restless 0 0 0 0  Easily annoyed or irritable 0 0 0 0  Afraid - awful might happen 0 0 0 0  Total GAD 7 Score  2 2 0  Anxiety Difficulty Not difficult at all Not difficult at all Not difficult at all     Social History[1]  Review of Systems Per  HPI unless specifically indicated above     Objective:    BP 122/70 (BP Location: Left Arm, Patient Position: Sitting, Cuff Size: Normal)   Pulse 74   Ht 4' 11 (1.499 m)   Wt 105 lb 6 oz (47.8 kg)   LMP  (LMP Unknown)   SpO2 97%   BMI 21.28 kg/m   Wt Readings from Last 3 Encounters:  01/03/24 105 lb 6 oz (47.8 kg)  09/22/23 104 lb (47.2 kg)  08/30/23 103 lb 8 oz (46.9 kg)    Physical Exam Vitals and nursing note reviewed.  Constitutional:      General: She is not in acute distress.    Appearance: Normal appearance. She is well-developed. She is not diaphoretic.     Comments: Thin elderly female, Well-appearing, comfortable, cooperative  HENT:     Head: Normocephalic and atraumatic.  Eyes:     General:        Right eye: No discharge.        Left eye: No discharge.     Conjunctiva/sclera: Conjunctivae normal.  Cardiovascular:     Rate and Rhythm: Normal rate.  Pulmonary:     Effort: Pulmonary effort is normal.  Skin:    General: Skin is warm and dry.     Findings: No erythema or rash.  Neurological:     Mental Status: She is alert and oriented to person, place, and time.  Psychiatric:        Mood and Affect: Mood normal.        Behavior: Behavior normal.        Thought Content: Thought content normal.     Comments: Well groomed, good eye contact, normal speech and thoughts     Results for orders placed or performed in visit on 01/03/24  POCT HgB A1C   Collection Time: 01/03/24  2:33 PM  Result Value Ref Range   Hemoglobin A1C 6.0 (A) 4.0 - 5.6 %   HbA1c POC (<> result, manual entry)     HbA1c, POC (prediabetic range)     HbA1c, POC (controlled diabetic range)        Assessment & Plan:   Problem List Items Addressed This Visit     Anemia in chronic kidney disease (CKD) (Chronic)   Anxiety associated with depression   Relevant Medications   escitalopram  (LEXAPRO ) 10 MG tablet   Benign hypertension with CKD (chronic kidney disease) stage III (HCC)    Relevant Medications   amLODipine  (NORVASC ) 5 MG tablet   CKD stage 3 due to type 2 diabetes mellitus (HCC)   Essential hypertension   Relevant Medications   amLODipine  (NORVASC ) 5 MG tablet   GERD (gastroesophageal reflux disease)   Relevant Medications  omeprazole  (PRILOSEC) 20 MG capsule   Hypothyroidism   Type 2 diabetes with stage 3 chronic kidney disease GFR 30-59 (HCC) - Primary   Relevant Orders   POCT HgB A1C (Completed)   Other Visit Diagnoses       Flu vaccine need       Relevant Orders   Flu vaccine HIGH DOSE PF(Fluzone Trivalent) (Completed)     Long term current use of oral hypoglycemic drug            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  - Continue current diabetes management plan. - Scheduled next blood sugar check in April.  Hypertensive chronic kidney disease stage 3 Blood pressure controlled at 122/70 mmHg. Dizziness possibly medication-related. - Continue current antihypertensive regimen. - Advised to rise slowly from sitting or lying positions to prevent dizziness.  Anemia in stage 3b chronic kidney disease Anemia persists, likely due to chronic kidney disease. Iron levels improved but not normal. - Continue to follow nephrologist's advice on protein intake. - Continue to monitor anemia and iron levels.  Osteoarthritis Chronic knee and joint pain likely due to osteoarthritis. Tylenol  considered safe. - Recommended Tylenol  for pain management, up to 3000 mg per day as needed. - Continue using heating pad for pain relief.  History of falls Recent fall with loss of consciousness and head injury. Dizziness and medication side effects possible factors. - Continue using life alert system for safety. - Advised to rise slowly from sitting or lying positions to prevent falls.  General health maintenance Vaccinations up to date. Blood pressure and blood  sugar well-controlled. - Continue routine health maintenance and vaccinations as needed. - Scheduled next appointment in April for blood sugar check.        Orders Placed This Encounter  Procedures   Flu vaccine HIGH DOSE PF(Fluzone Trivalent)   POCT HgB A1C    Meds ordered this encounter  Medications   amLODipine  (NORVASC ) 5 MG tablet    Sig: Take 1 tablet (5 mg total) by mouth daily.    Dispense:  90 tablet    Refill:  3   escitalopram  (LEXAPRO ) 10 MG tablet    Sig: Take 1 tablet (10 mg total) by mouth daily. with food    Dispense:  90 tablet    Refill:  3   omeprazole  (PRILOSEC) 20 MG capsule    Sig: Take 1 capsule (20 mg total) by mouth daily.    Dispense:  90 capsule    Refill:  3    Follow up plan: Return in about 4 months (around 05/03/2024) for 4 month DM A1c, CKD.  Marsa Officer, DO [facility] 01/03/2024, 2:33 PM     [1]  Social History Tobacco Use   Smoking status: Never   Smokeless tobacco: Never  Vaping Use   Vaping status: Never Used  Substance Use Topics   Alcohol use: Not Currently   Drug use: No

## 2024-01-03 NOTE — Patient Instructions (Addendum)
 Thank you for coming to the office today.  Recent Labs    04/30/23 1119 08/23/23 1025 01/03/24 1433  HGBA1C 6.7* 7.0* 6.0*   Okay with the dizziness you experience, as long as you are cautious with pausing with activity and walking when getting up. Goal to stand in spot for a minute before moving and avoiding being in a hurry. Caution with fall risk.  Recommend to start taking Tylenol  Extra Strength 500mg  tabs - take 1 to 2 tabs per dose (max 1000mg ) every 6-8 hours for pain (max 24 hour daily dose is 6 tablets or 3000mg . In the future you can repeat the same everyday Tylenol  course for 1-2 weeks at a time.   Refills added to medications  Keep up with your other specialists as planned and their testing and blood work.  Please schedule a Follow-up Appointment to: Return in about 4 months (around 05/03/2024) for 4 month DM A1c, CKD.  If you have any other questions or concerns, please feel free to call the office or send a message through MyChart. You may also schedule an earlier appointment if necessary.  Additionally, you may be receiving a survey about your experience at our office within a few days to 1 week by e-mail or mail. We value your feedback.  Marsa Officer, DO [facility], [place]

## 2024-01-05 ENCOUNTER — Encounter: Payer: Self-pay | Admitting: Pharmacist

## 2024-01-05 NOTE — Progress Notes (Signed)
° °  01/05/2024  Patient ID: Lisa Mills, female   DOB: 04-05-1929, 89 y.o.   MRN: [id number]  This patient is appearing on a report for being at risk of failing the adherence measure for diabetes medications this calendar year.   Medication: metformin  500 mg Last fill date: 11/22/2023 for 90 day supply  Insurance report was not up to date. No action needed at this time.   Sharyle Sia, PharmD, [facility] [phone]

## 2024-01-24 ENCOUNTER — Telehealth: Payer: Self-pay

## 2024-01-24 ENCOUNTER — Other Ambulatory Visit (HOSPITAL_COMMUNITY): Payer: Self-pay

## 2024-01-24 NOTE — Telephone Encounter (Signed)
 Pharmacy Patient Advocate Encounter   Received notification from Onbase that prior authorization for Simvastatin  40 tabs is required/requested.   Insurance verification completed.   The patient is insured through [facility] [employer]/RX [employer].   Per test claim: Refill too soon. PA is not needed at this time. Medication was filled 01/24/24. Next eligible fill date is 04/01/24.

## 2024-02-25 ENCOUNTER — Other Ambulatory Visit: Payer: Self-pay | Admitting: Family Medicine

## 2024-02-25 DIAGNOSIS — E1122 Type 2 diabetes mellitus with diabetic chronic kidney disease: Secondary | ICD-10-CM

## 2024-05-08 ENCOUNTER — Ambulatory Visit: Admitting: Family Medicine

## 2024-06-23 ENCOUNTER — Ambulatory Visit

## 2024-06-28 ENCOUNTER — Ambulatory Visit

## 2024-09-18 ENCOUNTER — Ambulatory Visit: Admitting: Urology
